# Patient Record
Sex: Female | Born: 1975 | Race: Black or African American | Hispanic: No | Marital: Single | Smoking: Former smoker
Health system: Southern US, Community
[De-identification: ages and names within clinical notes are randomized; demographics above are authoritative.]

## PROBLEM LIST (undated history)

## (undated) DIAGNOSIS — R569 Unspecified convulsions: Secondary | ICD-10-CM

## (undated) DIAGNOSIS — I519 Heart disease, unspecified: Secondary | ICD-10-CM

## (undated) DIAGNOSIS — R609 Edema, unspecified: Secondary | ICD-10-CM

## (undated) DIAGNOSIS — M069 Rheumatoid arthritis, unspecified: Secondary | ICD-10-CM

## (undated) DIAGNOSIS — J9601 Acute respiratory failure with hypoxia: Secondary | ICD-10-CM

## (undated) DIAGNOSIS — G4730 Sleep apnea, unspecified: Secondary | ICD-10-CM

## (undated) DIAGNOSIS — D649 Anemia, unspecified: Secondary | ICD-10-CM

## (undated) DIAGNOSIS — J45909 Unspecified asthma, uncomplicated: Secondary | ICD-10-CM

## (undated) DIAGNOSIS — E119 Type 2 diabetes mellitus without complications: Secondary | ICD-10-CM

## (undated) DIAGNOSIS — I10 Essential (primary) hypertension: Secondary | ICD-10-CM

## (undated) DIAGNOSIS — L309 Dermatitis, unspecified: Secondary | ICD-10-CM

## (undated) HISTORY — DX: Acute respiratory failure with hypoxia: J96.01

## (undated) HISTORY — DX: Essential (primary) hypertension: I10

## (undated) HISTORY — DX: Edema, unspecified: R60.9

## (undated) HISTORY — DX: Type 2 diabetes mellitus without complications: E11.9

## (undated) HISTORY — DX: Heart disease, unspecified: I51.9

## (undated) HISTORY — DX: Rheumatoid arthritis, unspecified: M06.9

## (undated) HISTORY — PX: GASTRIC BYPASS: SHX52

## (undated) HISTORY — DX: Dermatitis, unspecified: L30.9

## (undated) HISTORY — PX: APPENDECTOMY: SHX54

## (undated) NOTE — *Deleted (*Deleted)
Cardiology Office Note:    Date:  04/03/2020   ID:  Elizabeth Bautista, DOB [birth year], MRN [id number]  PCP:  Patient, No Pcp Per  Cardiologist:  No primary care provider on file.  Referring MD: Elenore Paddy, NP   No chief complaint on file.   History of Present Illness:    Elizabeth Bautista is a 57 y.o. female with a hx of hypertension, OSA on CPAP, type II diabetes, rheumatoid arthritis, obesity who is seen as a new consult at the request of Elenore Paddy, NP for the evaluation and management of chest pain.  I reviewed her recent hospitalization, discharged 03/13/20, treated for asthma exacerbation  Chest pain: -Initial onset: -Quality: -Frequency: -Duration: -Associated symptoms: -Aggravating/alleviating factors: -Prior cardiac history: -Prior ECG:  -Prior workup: -Prior treatment: -Alcohol: -Tobacco: -Comorbidities:  -Exercise level: -Cardiac ROS: no shortness of breath, no PND, no orthopnea, no LE edema, no syncope -Family history:   Past Medical History:  Diagnosis Date  . Anemia   . Asthma   . Diabetes (HCC)    on Metformin  . Edema   . 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 HTN (hypertension)   . Rheumatoid arthritis (HCC)    on Plaquenil   . Seizures (HCC)    last seizure 2019  . Sleep apnea   . Sleep apnea     Past Surgical History:  Procedure Laterality Date  . APPENDECTOMY    . CESAREAN SECTION     x3    Current Medications: Current Outpatient Medications on File Prior to Visit  Medication Sig  . acetaminophen (TYLENOL) 500 MG chewable tablet Chew 1 tablet (500 mg total) by mouth every 8 (eight) hours as needed for pain.  Marland Kitchen albuterol (PROAIR HFA) 108 (90 Base) MCG/ACT inhaler 2 puffs every 4 hours as needed only  if your can't catch your breath  . amLODipine (NORVASC) 5 MG tablet Take 1 tablet (5 mg total) by mouth daily.  . blood glucose meter kit and supplies  Dispense based on patient and insurance preference. Use up to four times daily as directed. (FOR ICD-10 E10.9, E11.9).  Marland Kitchen Blood Pressure Monitoring (SPHYGMOMANOMETER) MISC 1 each by Does not apply route daily.  . busPIRone (BUSPAR) 7.5 MG tablet Take 7.5 mg by mouth 2 (two) times daily.  . Cholecalciferol 1.25 MG (50000 UT) TABS Take 1 tablet by mouth daily.  . cyclobenzaprine (FLEXERIL) 10 MG tablet Take 10 mg by mouth 3 (three) times daily as needed for muscle spasms.   Marland Kitchen escitalopram (LEXAPRO) 10 MG tablet Take 1 tablet (10 mg total) by mouth daily.  Marland Kitchen escitalopram (LEXAPRO) 20 MG tablet Take 1 tablet by mouth every evening.  . famotidine (PEPCID) 20 MG tablet Take 20 mg by mouth daily.  . ferrous sulfate 325 (65 FE) MG tablet Take 325 mg by mouth 2 (two) times daily.  . fluticasone (CUTIVATE) 0.005 % ointment Apply 1 application topically daily.  . furosemide (LASIX) 20 MG tablet Take 20 mg by mouth daily.  Marland Kitchen gabapentin (NEURONTIN) 100 MG capsule Take 1 capsule (100 mg total) by mouth 3 (three) times daily.  . hydrochlorothiazide (HYDRODIURIL) 25 MG tablet Take 1 tablet (25 mg total) by mouth daily. For high blood pressure.  . hydroxychloroquine (PLAQUENIL) 200 MG tablet Take 1 tablet (200 mg total) by mouth 2 (two) times daily.  . hydrOXYzine (ATARAX/VISTARIL) 25 MG tablet Take  25 mg by mouth 2 (two) times daily.  Marland Kitchen ibuprofen (ADVIL) 800 MG tablet Take 1 tablet (800 mg total) by mouth every 8 (eight) hours as needed.  Marland Kitchen KLOR-CON M20 20 MEQ tablet TAKE 1 TABLET BY MOUTH DAILY ONLY WHEN YOU TAKE FUROSEMIDE. (Patient taking differently: 20 mEq daily. Only when you take Furosemide.)  . levETIRAcetam (KEPPRA) 500 MG tablet Take 1 tablet (500 mg total) by mouth 2 (two) times daily.  . mirtazapine (REMERON) 15 MG tablet Take 15 mg by mouth at bedtime as needed.  . montelukast (SINGULAIR) 10 MG tablet Take 1 tablet (10 mg total) by mouth at bedtime.  . pantoprazole (PROTONIX) 40 MG tablet Take 1  tablet (40 mg total) by mouth daily. Take 30-60 min before first meal of the day  . prazosin (MINIPRESS) 1 MG capsule Take 1 mg by mouth at bedtime.  . predniSONE (DELTASONE) 10 MG tablet One  daily x 5 days  Then one half  Daily  x 5 days and stop, if worse start over immediately  . QUEtiapine (SEROQUEL) 25 MG tablet Take 25 mg by mouth 2 (two) times daily.   . SYMBICORT 160-4.5 MCG/ACT inhaler Inhale 1 puff into the lungs in the morning and at bedtime.  . Tiotropium Bromide-Olodaterol (STIOLTO RESPIMAT) 2.5-2.5 MCG/ACT AERS Inhale 2 puffs into the lungs daily.  . Tiotropium Bromide-Olodaterol (STIOLTO RESPIMAT) 2.5-2.5 MCG/ACT AERS Inhale 2 puffs into the lungs daily.   No current facility-administered medications on file prior to visit.     Allergies:   Phenytoin and Phenylbutazones   Social History   Tobacco Use  . Smoking status: Former Smoker    Years: 10.00    Types: Cigarettes    Quit date: 06/02/2014    Years since quitting: 5.8  . Smokeless tobacco: Never Used  . Tobacco comment: during the 10 years of smoking, smoked 2-3 cigarettes/day  Vaping Use  . Vaping Use: Never used  Substance Use Topics  . Alcohol use: Not Currently  . Drug use: Not Currently    Family History: family history includes Asthma in her child, child, and maternal aunt; Breast cancer in her cousin; Cervical cancer in her maternal aunt; Dementia in her maternal aunt; Diabetes in her brother, father, paternal aunt, paternal grandmother, sister, and another family member; Heart Problems in her maternal grandmother and sister; Heart attack in an other family member; High blood pressure in her mother and another family member; Seizures in her cousin, cousin, and cousin.  ROS:   Please see the history of present illness.  Additional pertinent ROS: Constitutional: Negative for chills, fever, night sweats, unintentional weight loss  HENT: Negative for ear pain and hearing loss.   Eyes: Negative for loss of  vision and eye pain.  Respiratory: Negative for cough, sputum, wheezing.   Cardiovascular: See HPI. Gastrointestinal: Negative for abdominal pain, melena, and hematochezia.  Genitourinary: Negative for dysuria and hematuria.  Musculoskeletal: Negative for falls and myalgias.  Skin: Negative for itching and rash.  Neurological: Negative for focal weakness, focal sensory changes and loss of consciousness.  Endo/Heme/Allergies: Does not bruise/bleed easily.     EKGs/Labs/Other Studies Reviewed:    The following studies were reviewed today: ***  EKG:  EKG is personally reviewed.  The ekg ordered today demonstrates ***  Recent Labs: 02/16/2020: TSH 1.050 03/08/2020: ALT 19 03/13/2020: BUN 11; Hemoglobin 10.3; Magnesium 2.1; Platelets 283; Potassium 3.3; Sodium 139 03/14/2020: Creatinine, Ser 0.66  Recent Lipid Panel    Component Value Date/Time  CHOL 212 (H) 01/18/2020 1500   TRIG 88 01/18/2020 1500   HDL 82 01/18/2020 1500   CHOLHDL 2.6 01/18/2020 1500   LDLCALC 111 (H) 01/18/2020 1500    Physical Exam:    VS:  There were no vitals taken for this visit.    Wt Readings from Last 3 Encounters:  04/02/20 280 lb (127 kg)  03/08/20 285 lb 7.9 oz (129.5 kg)  02/27/20 284 lb (128.8 kg)    GEN: Well nourished, well developed in no acute distress HEENT: Normal, moist mucous membranes NECK: No JVD CARDIAC: regular rhythm, normal S1 and S2, no rubs or gallops. No murmurs. VASCULAR: Radial and DP pulses 2+ bilaterally. No carotid bruits RESPIRATORY:  Clear to auscultation without rales, wheezing or rhonchi  ABDOMEN: Soft, non-tender, non-distended MUSCULOSKELETAL:  Ambulates independently SKIN: Warm and dry, no edema NEUROLOGIC:  Alert and oriented x 3. No focal neuro deficits noted. PSYCHIATRIC:  Normal affect    ASSESSMENT:    No diagnosis found. PLAN:     Cardiac risk counseling and prevention recommendations: -recommend heart healthy/Mediterranean diet, with whole  grains, fruits, vegetable, fish, lean meats, nuts, and olive oil. Limit salt. -recommend moderate walking, 3-5 times/week for 30-50 minutes each session. Aim for at least 150 minutes.week. Goal should be pace of 3 miles/hours, or walking 1.5 miles in 30 minutes -recommend avoidance of tobacco products. Avoid excess alcohol. -Additional risk factor control:  -Diabetes risk: A1c is  -Lipids:   -Blood pressure control:  -Weight:  -ASCVD risk score: The 10-year ASCVD risk score Denman George DC Montez Hageman., et al., 2013) is: 3.3%   Values used to calculate the score:     Age: 23 years     Sex: Female     Is Non-Hispanic African American: Yes     Diabetic: Yes     Tobacco smoker: No     Systolic Blood Pressure: 130 mmHg     Is BP treated: Yes     HDL Cholesterol: 82 mg/dL     Total Cholesterol: 212 mg/dL    Plan for follow up:  Jodelle Red, MD, PhD [place]  [facility]    Medication Adjustments/Labs and Tests Ordered: Current medicines are reviewed at length with the patient today.  Concerns regarding medicines are outlined above.  No orders of the defined types were placed in this encounter.  No orders of the defined types were placed in this encounter.   There are no Patient Instructions on file for this visit.  Signed, Jodelle Red, MD PhD 04/03/2020 7:57 AM    [facility]

## (undated) NOTE — *Deleted (*Deleted)
Cardiology Office Note:    Date:  03/19/2020   ID:  Virgina Bautista, DOB Dec 05, 1975, MRN [id number]  PCP:  Wilson Singer, MD  Cardiologist:  No primary care provider on file.  Referring MD: Elenore Paddy, NP   No chief complaint on file.   History of Present Illness:    Elizabeth Bautista is a 39 y.o. female with a hx of *** who is seen as a new consult at the request of Elenore Paddy, NP for the evaluation and management of chest pain.  I reviewed her recent hospitalization, discharged 03/13/20, treated for asthma exacerbation.  Chest pain: -Initial onset: -Quality: -Frequency: -Duration: -Associated symptoms: -Aggravating/alleviating factors: -Prior cardiac history: -Prior ECG:  -Prior workup: -Prior treatment: -Alcohol: -Tobacco: -Comorbidities:  -Exercise level: -Cardiac ROS: no shortness of breath, no PND, no orthopnea, no LE edema, no syncope -Family history:   Past Medical History:  Diagnosis Date  . Anemia   . Asthma   . Diabetes (HCC)    on Metformin  . Edema   . 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 HTN (hypertension)   . Rheumatoid arthritis (HCC)    on Plaquenil   . Seizures (HCC)    last seizure 2019  . Sleep apnea   . Sleep apnea     Past Surgical History:  Procedure Laterality Date  . APPENDECTOMY    . CESAREAN SECTION     x3    Current Medications: Current Outpatient Medications on File Prior to Visit  Medication Sig  . acetaminophen (TYLENOL) 500 MG chewable tablet Chew 1 tablet (500 mg total) by mouth every 8 (eight) hours as needed for pain.  Marland Kitchen albuterol (PROAIR HFA) 108 (90 Base) MCG/ACT inhaler 2 puffs every 4 hours as needed only  if your can't catch your breath  . blood glucose meter kit and supplies Dispense based on patient and insurance preference. Use up to four times daily as directed. (FOR ICD-10 E10.9, E11.9).  Marland Kitchen Blood Pressure Monitoring  (SPHYGMOMANOMETER) MISC 1 each by Does not apply route daily.  . busPIRone (BUSPAR) 7.5 MG tablet Take 7.5 mg by mouth 2 (two) times daily.  . Cholecalciferol 1.25 MG (50000 UT) TABS Take 1 tablet by mouth daily.  . cyclobenzaprine (FLEXERIL) 10 MG tablet Take 10 mg by mouth 3 (three) times daily as needed for muscle spasms.   Marland Kitchen escitalopram (LEXAPRO) 10 MG tablet Take 1 tablet (10 mg total) by mouth daily.  Marland Kitchen escitalopram (LEXAPRO) 20 MG tablet Take 1 tablet by mouth every evening.  . famotidine (PEPCID) 20 MG tablet Take 20 mg by mouth daily.  . ferrous sulfate 325 (65 FE) MG tablet Take 325 mg by mouth 2 (two) times daily.  . fluticasone (CUTIVATE) 0.005 % ointment Apply 1 application topically daily.  Marland Kitchen gabapentin (NEURONTIN) 100 MG capsule Take 1 capsule (100 mg total) by mouth 3 (three) times daily.  . hydrochlorothiazide (HYDRODIURIL) 25 MG tablet Take 1 tablet (25 mg total) by mouth daily. For high blood pressure.  . hydroxychloroquine (PLAQUENIL) 200 MG tablet Take 1 tablet (200 mg total) by mouth 2 (two) times daily.  . hydrOXYzine (ATARAX/VISTARIL) 25 MG tablet Take 25 mg by mouth 2 (two) times daily.  Marland Kitchen ibuprofen (ADVIL) 800 MG tablet Take 1 tablet (800 mg total) by mouth every 8 (eight) hours as needed.  Marland Kitchen KLOR-CON M20 20 MEQ tablet TAKE 1  TABLET BY MOUTH DAILY ONLY WHEN YOU TAKE FUROSEMIDE. (Patient taking differently: 20 mEq daily. Only when you take Furosemide.)  . levETIRAcetam (KEPPRA) 500 MG tablet Take 1 tablet (500 mg total) by mouth 2 (two) times daily.  . mirtazapine (REMERON) 15 MG tablet Take 15 mg by mouth at bedtime as needed.  . montelukast (SINGULAIR) 10 MG tablet Take 1 tablet (10 mg total) by mouth at bedtime.  . pantoprazole (PROTONIX) 40 MG tablet Take 1 tablet (40 mg total) by mouth daily. Take 30-60 min before first meal of the day  . prazosin (MINIPRESS) 1 MG capsule Take 1 mg by mouth at bedtime.  Marland Kitchen QUEtiapine (SEROQUEL) 25 MG tablet Take 25 mg by mouth 2  (two) times daily.   . SYMBICORT 160-4.5 MCG/ACT inhaler Inhale 1 puff into the lungs in the morning and at bedtime.  . Tiotropium Bromide-Olodaterol (STIOLTO RESPIMAT) 2.5-2.5 MCG/ACT AERS Inhale 2 puffs into the lungs daily.   No current facility-administered medications on file prior to visit.     Allergies:   Phenytoin and Phenylbutazones   Social History   Tobacco Use  . Smoking status: Former Smoker    Years: 10.00    Types: Cigarettes    Quit date: 06/02/2014    Years since quitting: 5.8  . Smokeless tobacco: Never Used  . Tobacco comment: during the 10 years of smoking, smoked 2-3 cigarettes/day  Vaping Use  . Vaping Use: Never used  Substance Use Topics  . Alcohol use: Not Currently  . Drug use: Not Currently    Family History: family history includes Asthma in her child, child, and maternal aunt; Breast cancer in her cousin; Cervical cancer in her maternal aunt; Dementia in her maternal aunt; Diabetes in her brother, father, paternal aunt, paternal grandmother, sister, and another family member; Heart Problems in her maternal grandmother and sister; Heart attack in an other family member; High blood pressure in her mother and another family member; Seizures in her cousin, cousin, and cousin.  ROS:   Please see the history of present illness.  Additional pertinent ROS: Constitutional: Negative for chills, fever, night sweats, unintentional weight loss  HENT: Negative for ear pain and hearing loss.   Eyes: Negative for loss of vision and eye pain.  Respiratory: Negative for cough, sputum, wheezing.   Cardiovascular: See HPI. Gastrointestinal: Negative for abdominal pain, melena, and hematochezia.  Genitourinary: Negative for dysuria and hematuria.  Musculoskeletal: Negative for falls and myalgias.  Skin: Negative for itching and rash.  Neurological: Negative for focal weakness, focal sensory changes and loss of consciousness.  Endo/Heme/Allergies: Does not bruise/bleed  easily.     EKGs/Labs/Other Studies Reviewed:    The following studies were reviewed today: ***  EKG:  EKG is personally reviewed.  The ekg ordered today demonstrates ***  Recent Labs: 02/16/2020: TSH 1.050 03/08/2020: ALT 19 03/13/2020: BUN 11; Hemoglobin 10.3; Magnesium 2.1; Platelets 283; Potassium 3.3; Sodium 139 03/14/2020: Creatinine, Ser 0.66  Recent Lipid Panel    Component Value Date/Time   CHOL 212 (H) 01/18/2020 1500   TRIG 88 01/18/2020 1500   HDL 82 01/18/2020 1500   CHOLHDL 2.6 01/18/2020 1500   LDLCALC 111 (H) 01/18/2020 1500    Physical Exam:    VS:  There were no vitals taken for this visit.    Wt Readings from Last 3 Encounters:  03/08/20 285 lb 7.9 oz (129.5 kg)  02/27/20 284 lb (128.8 kg)  02/23/20 286 lb 6.4 oz (129.9 kg)  GEN: Well nourished, well developed in no acute distress HEENT: Normal, moist mucous membranes NECK: No JVD CARDIAC: regular rhythm, normal S1 and S2, no rubs or gallops. No murmurs. VASCULAR: Radial and DP pulses 2+ bilaterally. No carotid bruits RESPIRATORY:  Clear to auscultation without rales, wheezing or rhonchi  ABDOMEN: Soft, non-tender, non-distended MUSCULOSKELETAL:  Ambulates independently SKIN: Warm and dry, no edema NEUROLOGIC:  Alert and oriented x 3. No focal neuro deficits noted. PSYCHIATRIC:  Normal affect    ASSESSMENT:    No diagnosis found. PLAN:     Cardiac risk counseling and prevention recommendations: -recommend heart healthy/Mediterranean diet, with whole grains, fruits, vegetable, fish, lean meats, nuts, and olive oil. Limit salt. -recommend moderate walking, 3-5 times/week for 30-50 minutes each session. Aim for at least 150 minutes.week. Goal should be pace of 3 miles/hours, or walking 1.5 miles in 30 minutes -recommend avoidance of tobacco products. Avoid excess alcohol. -Additional risk factor control:  -Diabetes risk: A1c is  -Lipids:   -Blood pressure control:  -Weight:  -ASCVD risk  score: The 10-year ASCVD risk score Denman George DC Montez Hageman., et al., 2013) is: 2.6%   Values used to calculate the score:     Age: 76 years     Sex: Female     Is Non-Hispanic African American: Yes     Diabetic: Yes     Tobacco smoker: No     Systolic Blood Pressure: 127 mmHg     Is BP treated: Yes     HDL Cholesterol: 82 mg/dL     Total Cholesterol: 212 mg/dL    Plan for follow up:  Jodelle Red, MD, PhD [place]  [facility]    Medication Adjustments/Labs and Tests Ordered: Current medicines are reviewed at length with the patient today.  Concerns regarding medicines are outlined above.  No orders of the defined types were placed in this encounter.  No orders of the defined types were placed in this encounter.   There are no Patient Instructions on file for this visit.  Signed, Jodelle Red, MD PhD 03/19/2020 8:16 AM    [facility]

---

## 2001-06-02 HISTORY — PX: TUBAL LIGATION: SHX77

## 2016-10-23 LAB — PULMONARY FUNCTION TEST
DLCO: 4.73 ml/mmHg sec
FEV1/FVC: 83 %
FEV1: 2.52 L
FVC: 3.07 L
TLC: 4.56

## 2017-07-08 LAB — GLUCOSE, POCT (MANUAL RESULT ENTRY): POC Glucose: 129 mg/dl — AB (ref 70–99)

## 2017-07-09 LAB — GLUCOSE, POCT (MANUAL RESULT ENTRY)
POC Glucose: 143 mg/dl — AB (ref 70–99)
POC Glucose: 92 mg/dl (ref 70–99)

## 2017-07-10 LAB — GLUCOSE, POCT (MANUAL RESULT ENTRY): POC Glucose: 136 mg/dl — AB (ref 70–99)

## 2019-10-18 ENCOUNTER — Emergency Department (HOSPITAL_COMMUNITY): Payer: PRIVATE HEALTH INSURANCE

## 2019-10-18 ENCOUNTER — Inpatient Hospital Stay (HOSPITAL_COMMUNITY)
Admission: EM | Admit: 2019-10-18 | Discharge: 2019-10-23 | DRG: 202 | Disposition: A | Discharge status: Home / Self Care | Payer: PRIVATE HEALTH INSURANCE | Attending: Internal Medicine | Admitting: Internal Medicine

## 2019-10-18 ENCOUNTER — Encounter (HOSPITAL_COMMUNITY): Payer: Self-pay | Admitting: *Deleted

## 2019-10-18 ENCOUNTER — Other Ambulatory Visit: Payer: Self-pay

## 2019-10-18 ENCOUNTER — Ambulatory Visit
Admission: EM | Admit: 2019-10-18 | Discharge: 2019-10-18 | Disposition: A | Discharge status: Home / Self Care | Payer: Self-pay

## 2019-10-18 DIAGNOSIS — M069 Rheumatoid arthritis, unspecified: Secondary | ICD-10-CM | POA: Diagnosis present

## 2019-10-18 DIAGNOSIS — Z87891 Personal history of nicotine dependence: Secondary | ICD-10-CM

## 2019-10-18 DIAGNOSIS — E1165 Type 2 diabetes mellitus with hyperglycemia: Secondary | ICD-10-CM | POA: Diagnosis present

## 2019-10-18 DIAGNOSIS — G4733 Obstructive sleep apnea (adult) (pediatric): Secondary | ICD-10-CM | POA: Diagnosis present

## 2019-10-18 DIAGNOSIS — Z888 Allergy status to other drugs, medicaments and biological substances status: Secondary | ICD-10-CM

## 2019-10-18 DIAGNOSIS — K219 Gastro-esophageal reflux disease without esophagitis: Secondary | ICD-10-CM | POA: Diagnosis present

## 2019-10-18 DIAGNOSIS — F32A Depression, unspecified: Secondary | ICD-10-CM | POA: Diagnosis present

## 2019-10-18 DIAGNOSIS — D649 Anemia, unspecified: Secondary | ICD-10-CM | POA: Diagnosis present

## 2019-10-18 DIAGNOSIS — F419 Anxiety disorder, unspecified: Secondary | ICD-10-CM | POA: Diagnosis present

## 2019-10-18 DIAGNOSIS — J45901 Unspecified asthma with (acute) exacerbation: Secondary | ICD-10-CM | POA: Diagnosis not present

## 2019-10-18 DIAGNOSIS — Z20822 Contact with and (suspected) exposure to covid-19: Secondary | ICD-10-CM | POA: Diagnosis present

## 2019-10-18 DIAGNOSIS — R0602 Shortness of breath: Secondary | ICD-10-CM | POA: Diagnosis not present

## 2019-10-18 DIAGNOSIS — R490 Dysphonia: Secondary | ICD-10-CM | POA: Diagnosis present

## 2019-10-18 DIAGNOSIS — Z886 Allergy status to analgesic agent status: Secondary | ICD-10-CM

## 2019-10-18 DIAGNOSIS — Z79899 Other long term (current) drug therapy: Secondary | ICD-10-CM

## 2019-10-18 DIAGNOSIS — J383 Other diseases of vocal cords: Secondary | ICD-10-CM | POA: Diagnosis present

## 2019-10-18 DIAGNOSIS — Z833 Family history of diabetes mellitus: Secondary | ICD-10-CM

## 2019-10-18 DIAGNOSIS — Z6841 Body Mass Index (BMI) 40.0 and over, adult: Secondary | ICD-10-CM

## 2019-10-18 DIAGNOSIS — M059 Rheumatoid arthritis with rheumatoid factor, unspecified: Secondary | ICD-10-CM

## 2019-10-18 DIAGNOSIS — Z8249 Family history of ischemic heart disease and other diseases of the circulatory system: Secondary | ICD-10-CM

## 2019-10-18 DIAGNOSIS — F329 Major depressive disorder, single episode, unspecified: Secondary | ICD-10-CM

## 2019-10-18 DIAGNOSIS — R0600 Dyspnea, unspecified: Secondary | ICD-10-CM

## 2019-10-18 HISTORY — DX: Sleep apnea, unspecified: G47.30

## 2019-10-18 HISTORY — DX: Unspecified convulsions: R56.9

## 2019-10-18 HISTORY — DX: Anemia, unspecified: D64.9

## 2019-10-18 HISTORY — DX: Unspecified asthma, uncomplicated: J45.909

## 2019-10-18 LAB — SARS CORONAVIRUS 2 BY RT PCR (HOSPITAL ORDER, PERFORMED IN ~~LOC~~ HOSPITAL LAB): SARS Coronavirus 2: NEGATIVE

## 2019-10-18 LAB — COMPREHENSIVE METABOLIC PANEL
ALT: 16 U/L (ref 0–44)
AST: 19 U/L (ref 15–41)
Albumin: 3.6 g/dL (ref 3.5–5.0)
Alkaline Phosphatase: 76 U/L (ref 38–126)
Anion gap: 7 (ref 5–15)
BUN: 8 mg/dL (ref 6–20)
CO2: 24 mmol/L (ref 22–32)
Calcium: 8.6 mg/dL — ABNORMAL LOW (ref 8.9–10.3)
Chloride: 107 mmol/L (ref 98–111)
Creatinine, Ser: 0.66 mg/dL (ref 0.44–1.00)
GFR calc Af Amer: >60 mL/min (ref >60)
GFR calc non Af Amer: >60 mL/min (ref >60)
Glucose, Bld: 133 mg/dL — ABNORMAL HIGH (ref 70–99)
Potassium: 4 mmol/L (ref 3.5–5.1)
Sodium: 138 mmol/L (ref 135–145)
Total Bilirubin: 0.4 mg/dL (ref 0.3–1.2)
Total Protein: 7.1 g/dL (ref 6.5–8.1)

## 2019-10-18 LAB — CBC WITH DIFFERENTIAL/PLATELET
Abs Immature Granulocytes: 0.02 10*3/uL (ref 0.00–0.07)
Basophils Absolute: 0 10*3/uL (ref 0.0–0.1)
Basophils Relative: 1 %
Eosinophils Absolute: 0.4 10*3/uL (ref 0.0–0.5)
Eosinophils Relative: 7 %
HCT: 39 % (ref 36.0–46.0)
Hemoglobin: 11.8 g/dL — ABNORMAL LOW (ref 12.0–15.0)
Immature Granulocytes: 0 %
Lymphocytes Relative: 20 %
Lymphs Abs: 1.2 10*3/uL (ref 0.7–4.0)
MCH: 24.1 pg — ABNORMAL LOW (ref 26.0–34.0)
MCHC: 30.3 g/dL (ref 30.0–36.0)
MCV: 79.8 fL — ABNORMAL LOW (ref 80.0–100.0)
Monocytes Absolute: 0.4 10*3/uL (ref 0.1–1.0)
Monocytes Relative: 6 %
Neutro Abs: 3.9 10*3/uL (ref 1.7–7.7)
Neutrophils Relative %: 66 %
Platelets: 228 10*3/uL (ref 150–400)
RBC: 4.89 MIL/uL (ref 3.87–5.11)
RDW: 15.9 % — ABNORMAL HIGH (ref 11.5–15.5)
WBC: 5.8 10*3/uL (ref 4.0–10.5)
nRBC: 0 % (ref 0.0–0.2)

## 2019-10-18 LAB — BLOOD GAS, ARTERIAL
Acid-base deficit: 9.8 mmol/L — ABNORMAL HIGH (ref 0.0–2.0)
Bicarbonate: 17.1 mmol/L — ABNORMAL LOW (ref 20.0–28.0)
FIO2: 21
O2 Saturation: 99.1 %
Patient temperature: 37
pCO2 arterial: 28.7 mmHg — ABNORMAL LOW (ref 32.0–48.0)
pH, Arterial: 7.336 — ABNORMAL LOW (ref 7.350–7.450)
pO2, Arterial: 165 mmHg — ABNORMAL HIGH (ref 83.0–108.0)

## 2019-10-18 IMAGING — DX DG CHEST 1V PORT
1 series · 1 of 1 positions shown · non-contrast
Comparison: None.

CLINICAL DATA: Shortness of breath

EXAM:
PORTABLE CHEST 1 VIEW

[chest ap]
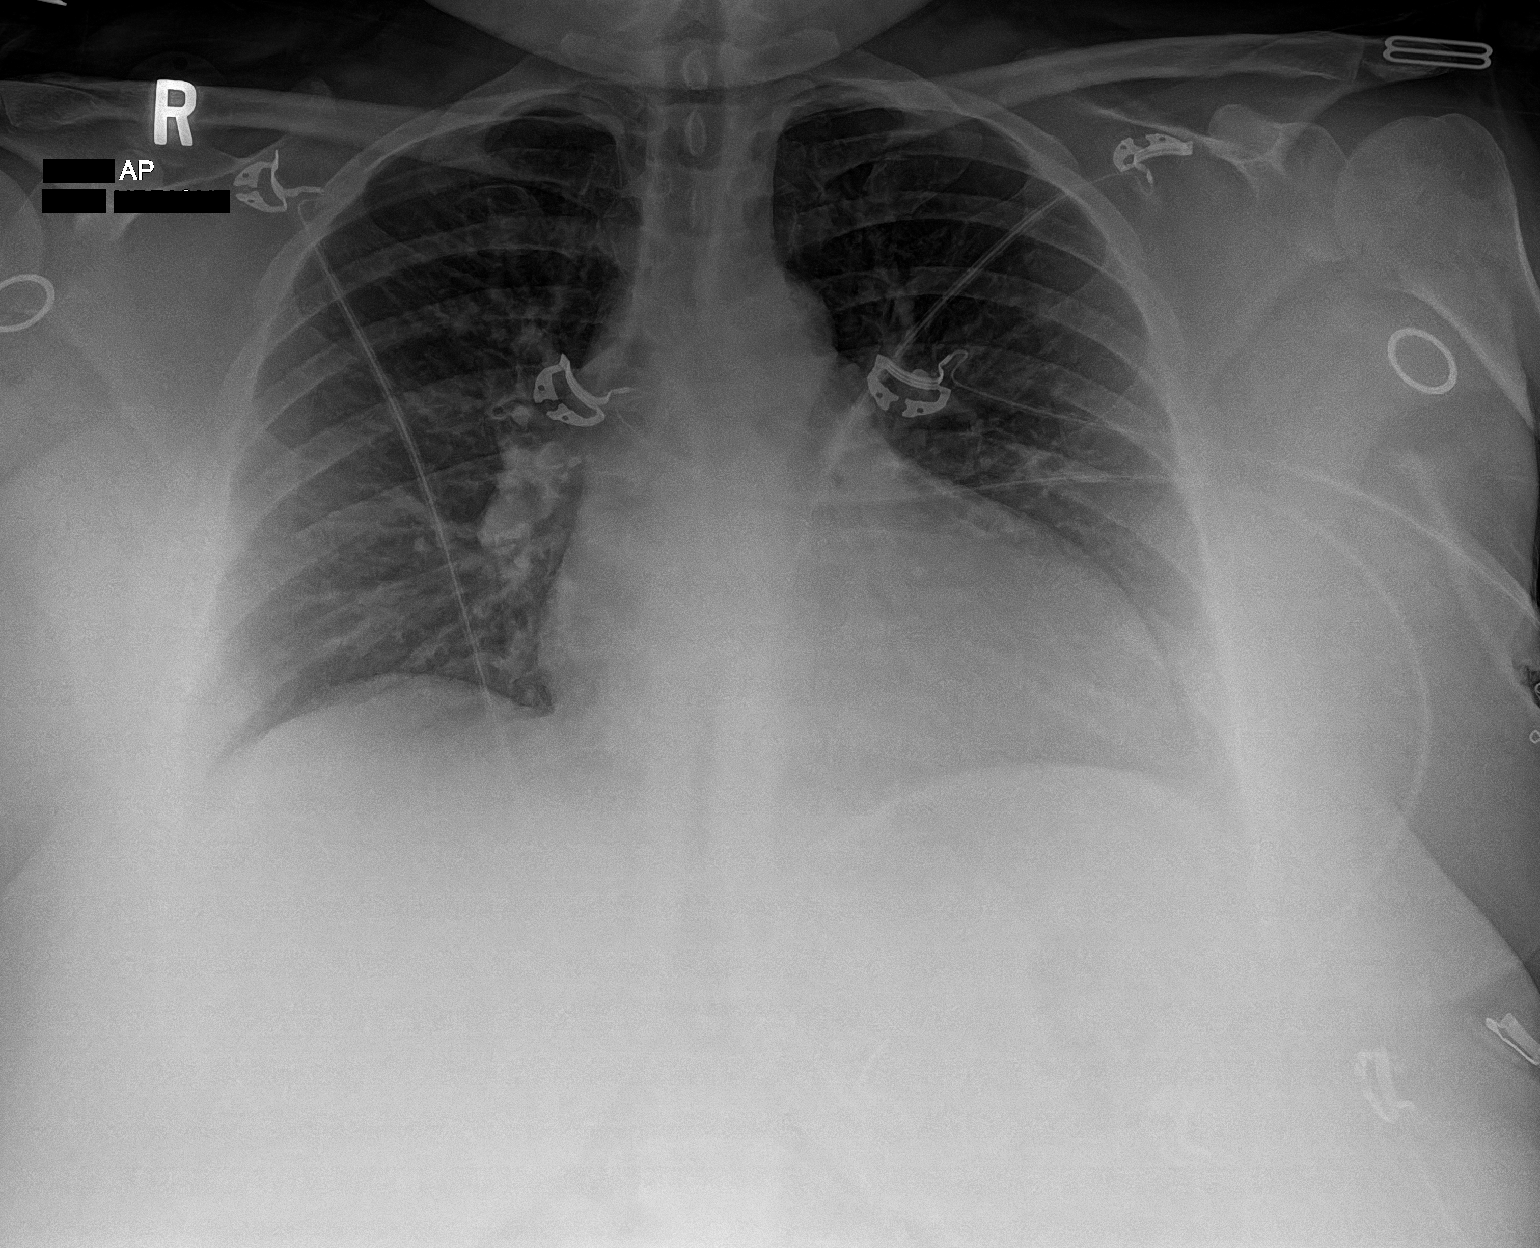

[1 of 1 positions shown; findings below may reference images not displayed]

FINDINGS: Lungs are clear. Heart is borderline enlarged with pulmonary
vascularity normal. No adenopathy. No bone lesions.
IMPRESSION: Borderline cardiac enlargement.  Lungs clear.

## 2019-10-18 MED ORDER — METHYLPREDNISOLONE SODIUM SUCC 125 MG IJ SOLR
125.0000 mg | Freq: Once | INTRAMUSCULAR | Status: AC
  Administered 2019-10-18: 125 mg via INTRAVENOUS
  Filled 2019-10-18: qty 2

## 2019-10-18 MED ORDER — NAPROXEN 250 MG PO TABS
500.0000 mg | ORAL_TABLET | Freq: Once | ORAL | Status: AC
  Administered 2019-10-18: 500 mg via ORAL
  Filled 2019-10-18 (×2): qty 2

## 2019-10-18 MED ORDER — TIOTROPIUM BROMIDE MONOHYDRATE 2.5 MCG/ACT IN AERS: 2.0000 | INHALATION_SPRAY | Freq: Every day | RESPIRATORY_TRACT | Status: DC

## 2019-10-18 MED ORDER — ALBUTEROL SULFATE (2.5 MG/3ML) 0.083% IN NEBU
2.5000 mg | INHALATION_SOLUTION | RESPIRATORY_TRACT | Status: DC | PRN
  Administered 2019-10-18: 2.5 mg via RESPIRATORY_TRACT
  Filled 2019-10-18 (×2): qty 3

## 2019-10-18 MED ORDER — IPRATROPIUM-ALBUTEROL 0.5-2.5 (3) MG/3ML IN SOLN
3.0000 mL | Freq: Once | RESPIRATORY_TRACT | Status: AC
  Administered 2019-10-18: 3 mL via RESPIRATORY_TRACT
  Filled 2019-10-18: qty 3

## 2019-10-18 MED ORDER — ACETAMINOPHEN 325 MG PO TABS
650.0000 mg | ORAL_TABLET | Freq: Four times a day (QID) | ORAL | Status: DC | PRN
  Administered 2019-10-20: 650 mg via ORAL
  Filled 2019-10-18: qty 2

## 2019-10-18 MED ORDER — ESCITALOPRAM OXALATE 10 MG PO TABS
10.0000 mg | ORAL_TABLET | Freq: Every day | ORAL | Status: DC
  Administered 2019-10-18 – 2019-10-22 (×5): 10 mg via ORAL
  Filled 2019-10-18 (×5): qty 1

## 2019-10-18 MED ORDER — QUETIAPINE FUMARATE 25 MG PO TABS
25.0000 mg | ORAL_TABLET | Freq: Two times a day (BID) | ORAL | Status: DC
  Administered 2019-10-18 – 2019-10-23 (×10): 25 mg via ORAL
  Filled 2019-10-18 (×10): qty 1

## 2019-10-18 MED ORDER — METHYLPREDNISOLONE SODIUM SUCC 125 MG IJ SOLR
125.0000 mg | Freq: Once | INTRAMUSCULAR | Status: AC
  Administered 2019-10-18: 125 mg via INTRAMUSCULAR

## 2019-10-18 MED ORDER — CYCLOBENZAPRINE HCL 10 MG PO TABS: 10.0000 mg | ORAL_TABLET | Freq: Three times a day (TID) | ORAL | Status: DC | PRN

## 2019-10-18 MED ORDER — HYDROXYCHLOROQUINE SULFATE 200 MG PO TABS
200.0000 mg | ORAL_TABLET | Freq: Two times a day (BID) | ORAL | Status: DC
  Administered 2019-10-18 – 2019-10-23 (×10): 200 mg via ORAL
  Filled 2019-10-18 (×13): qty 1

## 2019-10-18 MED ORDER — IPRATROPIUM-ALBUTEROL 0.5-2.5 (3) MG/3ML IN SOLN
3.0000 mL | RESPIRATORY_TRACT | Status: DC | PRN
  Administered 2019-10-18: 3 mL via RESPIRATORY_TRACT
  Filled 2019-10-18: qty 3

## 2019-10-18 MED ORDER — PROMETHAZINE HCL 12.5 MG PO TABS: 12.5000 mg | ORAL_TABLET | Freq: Four times a day (QID) | ORAL | Status: DC | PRN

## 2019-10-18 MED ORDER — METHYLPREDNISOLONE SODIUM SUCC 125 MG IJ SOLR: 80.0000 mg | Freq: Two times a day (BID) | INTRAMUSCULAR | Status: DC

## 2019-10-18 MED ORDER — SODIUM CHLORIDE 0.9 % IV BOLUS
500.0000 mL | Freq: Once | INTRAVENOUS | Status: AC
  Administered 2019-10-18: 500 mL via INTRAVENOUS

## 2019-10-18 MED ORDER — ALBUTEROL SULFATE HFA 108 (90 BASE) MCG/ACT IN AERS: 2.0000 | INHALATION_SPRAY | Freq: Once | RESPIRATORY_TRACT | Status: AC

## 2019-10-18 MED ORDER — MAGNESIUM SULFATE 2 GM/50ML IV SOLN
2.0000 g | Freq: Once | INTRAVENOUS | Status: AC
  Administered 2019-10-18: 2 g via INTRAVENOUS
  Filled 2019-10-18: qty 50

## 2019-10-18 MED ORDER — POLYETHYLENE GLYCOL 3350 17 G PO PACK: 17.0000 g | PACK | Freq: Every day | ORAL | Status: DC | PRN

## 2019-10-18 MED ORDER — ACETAMINOPHEN 650 MG RE SUPP: 650.0000 mg | Freq: Four times a day (QID) | RECTAL | Status: DC | PRN

## 2019-10-18 MED ORDER — LORAZEPAM 2 MG/ML IJ SOLN
0.5000 mg | Freq: Once | INTRAMUSCULAR | Status: AC
  Administered 2019-10-18: 0.5 mg via INTRAVENOUS
  Filled 2019-10-18: qty 1

## 2019-10-18 MED ORDER — GUAIFENESIN-DM 100-10 MG/5ML PO SYRP
10.0000 mL | ORAL_SOLUTION | Freq: Four times a day (QID) | ORAL | Status: AC
  Administered 2019-10-18 – 2019-10-19 (×4): 10 mL via ORAL
  Filled 2019-10-18 (×4): qty 10

## 2019-10-18 MED ORDER — UMECLIDINIUM BROMIDE 62.5 MCG/INH IN AEPB
1.0000 | INHALATION_SPRAY | Freq: Every day | RESPIRATORY_TRACT | Status: DC
  Filled 2019-10-18: qty 7

## 2019-10-18 MED ORDER — ALBUTEROL SULFATE (2.5 MG/3ML) 0.083% IN NEBU
2.5000 mg | INHALATION_SOLUTION | Freq: Four times a day (QID) | RESPIRATORY_TRACT | Status: DC
  Administered 2019-10-18: 2.5 mg via RESPIRATORY_TRACT

## 2019-10-18 MED ORDER — BUSPIRONE HCL 5 MG PO TABS
7.5000 mg | ORAL_TABLET | Freq: Two times a day (BID) | ORAL | Status: DC
  Administered 2019-10-18 – 2019-10-23 (×10): 7.5 mg via ORAL
  Filled 2019-10-18 (×10): qty 2

## 2019-10-18 MED ORDER — HYDROXYZINE HCL 10 MG PO TABS
10.0000 mg | ORAL_TABLET | Freq: Two times a day (BID) | ORAL | Status: DC
  Administered 2019-10-18 – 2019-10-23 (×10): 10 mg via ORAL
  Filled 2019-10-18 (×14): qty 1

## 2019-10-18 MED ORDER — NAPROXEN 250 MG PO TABS
500.0000 mg | ORAL_TABLET | Freq: Two times a day (BID) | ORAL | Status: DC
  Administered 2019-10-19 – 2019-10-21 (×4): 500 mg via ORAL
  Filled 2019-10-18 (×9): qty 2

## 2019-10-18 MED ORDER — ENOXAPARIN SODIUM 80 MG/0.8ML ~~LOC~~ SOLN
0.5000 mg/kg | SUBCUTANEOUS | Status: DC
  Administered 2019-10-18 – 2019-10-22 (×5): 65 mg via SUBCUTANEOUS
  Filled 2019-10-18 (×5): qty 0.8

## 2019-10-18 MED ORDER — MONTELUKAST SODIUM 10 MG PO TABS
10.0000 mg | ORAL_TABLET | Freq: Every day | ORAL | Status: DC
  Administered 2019-10-18 – 2019-10-22 (×5): 10 mg via ORAL
  Filled 2019-10-18 (×5): qty 1

## 2019-10-18 MED ORDER — ALBUTEROL SULFATE HFA 108 (90 BASE) MCG/ACT IN AERS
INHALATION_SPRAY | RESPIRATORY_TRACT | Status: AC
  Administered 2019-10-18: 2 via RESPIRATORY_TRACT
  Filled 2019-10-18: qty 6.7

## 2019-10-18 MED ORDER — FAMOTIDINE 20 MG PO TABS
20.0000 mg | ORAL_TABLET | Freq: Every day | ORAL | Status: DC
  Administered 2019-10-18 – 2019-10-19 (×2): 20 mg via ORAL
  Filled 2019-10-18 (×2): qty 1

## 2019-10-18 MED ORDER — ALBUTEROL (5 MG/ML) CONTINUOUS INHALATION SOLN
15.0000 mg | INHALATION_SOLUTION | Freq: Once | RESPIRATORY_TRACT | Status: AC
  Administered 2019-10-18: 15 mg via RESPIRATORY_TRACT
  Filled 2019-10-18: qty 20

## 2019-10-18 MED ORDER — IPRATROPIUM-ALBUTEROL 0.5-2.5 (3) MG/3ML IN SOLN
3.0000 mL | Freq: Four times a day (QID) | RESPIRATORY_TRACT | Status: DC
  Administered 2019-10-19 – 2019-10-21 (×10): 3 mL via RESPIRATORY_TRACT
  Filled 2019-10-18 (×10): qty 3

## 2019-10-18 NOTE — H&P (Addendum)
History and Physical    Elizabeth Bautista ZSM:[id number] DOB: November 07, 1975 DOA: 10/18/2019  PCP: System, Pcp Not In   Patient coming from: Home   I have personally briefly reviewed patient's old medical records in [facility]  Chief Complaint: Difficulty breathing.  HPI: Elizabeth Bautista is a 44 y.o. female with medical history significant for asthma, anemia, seizures, sleep apnea.  Patient presented to the ED with complaints of difficulty breathing started 2 weeks ago.  She also reports wheezing, cough productive of clear sputum.  She reports this is consistent with her asthma exacerbations. No chest pains, no lower extremity swelling. Never been diagnosed with COVID-19 infection, and has not received her COVID vaccination. Patient lives in [place], and has been in [place] visiting for the past 2 months. Is undecided if she is going to move to [place]. She reports admissions in the past for asthma exacerbations in [place]. She has never required intubated.  ED Course: O2 sats 98% on room air temperature 98.2.  WBC 5.8.  Portable chest x-ray without acute abnormality.  COVID-19 test negative.  DuoNebs, hour-long albuterol nebulizer, Ativan 0.5 mg, Solu-Medrol 125 mg, magnesium, given, still with significant wheezing and increased work of breathing hence hospitalist was called for admission for asthma exacerbation.  Review of Systems: As per HPI all other systems reviewed and negative.  Past Medical History:  Diagnosis Date  . Anemia   . Asthma   . Seizures (HCC)    last seizure 2019  . Sleep apnea     Past Surgical History:  Procedure Laterality Date  . APPENDECTOMY    . CESAREAN SECTION     x3     reports that she quit smoking about 5 years ago. She has never used smokeless tobacco. She reports previous alcohol use. She reports previous drug use.  Allergies  Allergen Reactions  . Phenytoin Anaphylaxis  . Phenylbutazones     Family History  Family history unknown:  Yes   Family history of hypertension and diabetes.  Prior to Admission medications   Medication Sig Start Date End Date Taking? Authorizing Provider  albuterol (VENTOLIN HFA) 108 (90 Base) MCG/ACT inhaler Inhale 1-2 puffs into the lungs every 6 (six) hours as needed for wheezing or shortness of breath.  08/26/19  Yes [provider]  busPIRone (BUSPAR) 7.5 MG tablet Take 7.5 mg by mouth 2 (two) times daily. 09/05/19  Yes [provider]  Cholecalciferol (VITAMIN D3) 10 MCG (400 UNIT) tablet Take 800 Units by mouth daily.  05/24/19  Yes [provider]  cyclobenzaprine (FLEXERIL) 10 MG tablet Take 10 mg by mouth 3 (three) times daily as needed for muscle spasms.  08/27/19  Yes [provider]  escitalopram (LEXAPRO) 10 MG tablet Take 10 mg by mouth in the morning and at bedtime.  09/05/19  Yes [provider]  famotidine (PEPCID) 20 MG tablet Take 20 mg by mouth daily. 07/31/19  Yes [provider]  ferrous sulfate 325 (65 FE) MG tablet Take 325 mg by mouth 2 (two) times daily. 05/17/19  Yes [provider]  fluticasone (CUTIVATE) 0.005 % ointment Apply 1 application topically daily. 10/11/19  Yes [provider]  hydroxychloroquine (PLAQUENIL) 200 MG tablet Take 200 mg by mouth 2 (two) times daily. 07/31/19  Yes [provider]  hydrOXYzine (ATARAX/VISTARIL) 10 MG tablet Take 10 mg by mouth in the morning and at bedtime. 09/05/19  Yes [provider]  ipratropium-albuterol (DUONEB) 0.5-2.5 (3) MG/3ML  SOLN Inhale 3 mLs into the lungs every 6 (six) hours as needed (for shortness of breath/wheezing).  07/31/19  Yes [provider]  montelukast (SINGULAIR) 10 MG tablet Take 10 mg by mouth at bedtime. 04/22/19  Yes [provider]  naproxen (NAPROSYN) 500 MG tablet Take 500 mg by mouth 2 (two) times daily. 09/26/19  Yes [provider]  QUEtiapine (SEROQUEL) 25 MG tablet Take 25 mg by mouth 2 (two)  times daily.  09/05/19  Yes [provider]  SPIRIVA RESPIMAT 2.5 MCG/ACT AERS Inhale 2 puffs into the lungs daily.  10/08/19  Yes [provider]    Physical Exam: Vitals:   10/18/19 1700 10/18/19 1703 10/18/19 1730 10/18/19 1800  BP: (!) 159/107  (!) 142/94 (!) 124/91  Pulse: 95  98 100  Resp: (!) 22  (!) 22 (!) 23  Temp:      TempSrc:      SpO2: 97% 99% 98% 99%  Weight:      Height:        Constitutional: Mild to moderate increased work of breathing. Vitals:   10/18/19 1700 10/18/19 1703 10/18/19 1730 10/18/19 1800  BP: (!) 159/107  (!) 142/94 (!) 124/91  Pulse: 95  98 100  Resp: (!) 22  (!) 22 (!) 23  Temp:      TempSrc:      SpO2: 97% 99% 98% 99%  Weight:      Height:       Eyes: PERRL, lids and conjunctivae normal ENMT: Mucous membranes are moist.   Neck: normal, supple, no masses, no thyromegaly Respiratory: Diffuse expiratory wheezing, mild to moderate increased work of breathing and accessory muscle use.  Cardiovascular: Mildly tachycardic, regular rate and rhythm, no murmurs / rubs / gallops. No extremity edema. 2+ pedal pulses. No carotid bruits.  Abdomen: no tenderness, no masses palpated. No hepatosplenomegaly. Bowel sounds positive.  Musculoskeletal: no clubbing / cyanosis. No joint deformity upper and lower extremities. Good ROM, no contractures.   Skin: no rashes, lesions, ulcers. No induration Neurologic: No apparent cranial nerve abnormality, moving L extremities spontaneously. Psychiatric: Normal judgment and insight. Alert and oriented x 3. Normal mood.   Labs on Admission: I have personally reviewed following labs and imaging studies  CBC: Recent Labs  Lab 10/18/19 1455  WBC 5.8  NEUTROABS 3.9  HGB 11.8*  HCT 39.0  MCV 79.8*  PLT 228   Basic Metabolic Panel: Recent Labs  Lab 10/18/19 1455  NA 138  K 4.0  CL 107  CO2 24  GLUCOSE 133*  BUN 8  CREATININE 0.66  CALCIUM 8.6*   Liver Function Tests: Recent Labs  Lab  10/18/19 1455  AST 19  ALT 16  ALKPHOS 76  BILITOT 0.4  PROT 7.1  ALBUMIN 3.6   Radiological Exams on Admission: DG Chest Port 1 View  Result Date: 10/18/2019 CLINICAL DATA:  Shortness of breath EXAM: PORTABLE CHEST 1 VIEW COMPARISON:  None. FINDINGS: Lungs are clear. Heart is borderline enlarged with pulmonary vascularity normal. No adenopathy. No bone lesions. IMPRESSION: Borderline cardiac enlargement.  Lungs clear. Electronically Signed   By: Bretta Bang III M.D.   On: 10/18/2019 15:10    EKG: Sinus tachycardia.  QTc no prior EKG to compare.  Assessment/Plan Principal Problem:   Asthma exacerbation Active Problems:   Sleep apnea   Depression  Asthma exacerbation-continued wheezing, productive cough, dyspnea, O2 sats > 97% on room air. Negative for COVID-19 infection. Portable chest x-ray unremarkable. Prior admissions  in [place] for same, no history of intubations. -Albuterol nebulizer scheduled and PRN -Mucolytic's -125 mg Solu-Medrol given, continue 80mg  every 12 hourly -Social work consult. -I have extensively counseled patient on why she should get to COVID-19 vaccine. -peak flow meter -Resume Singulair, Spiriva. -Will need to establish care with a pulmonologist if she is plans to stay in [place] increased work of breathing, reevaluated, awake, alert oriented, still with diffuse expiratory wheezing, tachypneic, tachycardic, patient reports no improvement.  ABG-pH 7.33, PCO2-28, PO2 165.  Will admit to stepdown and start BiPAP, prophylactically as I fear she will tire out.   Tachycardia-likely from continuous nebulizer, frequent albuterol treatments.  EKG shows sinus tachycardia.  Sleep apnea-uses CPAP at home. - CPAP nightly  Depression-  -Resume home Seroquel, BuSpar, hydroxyzine, Lexapro  History of seizures-reports no seizures since 2019, she was taken off antiseizure medications and started on Flexeril for muscle  spasms.  History of headaches- admitted January 2021- [place] for same, etiology not identified, head CT, MRI unremarkable. -Resume Naproxen BID PRN  Morbid obesity  Rheumatoid arthritis-stable. -Resume home Plaquenil   DVT prophylaxis: Lovenox Code Status: Full code Family Communication: Cousin at bedside.  Disposition Plan: 1 to 2 days Consults called: None Admission status: Obs, stepdown   Bethena Roys MD [facility]  10/18/2019, 10:43 PM

## 2019-10-18 NOTE — Progress Notes (Signed)
Patient transferred to [facility] Unit. Belongings with patient.

## 2019-10-18 NOTE — Progress Notes (Signed)
CSW has reviewed patients chart and notes that patient is without primary care and/or insurance. CSW will follow up with patient regarding this matter and provide assistance as needed.   Elizabeth Bautista M. Rayhan Groleau LCSWA Transitions of Care  Clinical Social Worker  Ph: [phone] 

## 2019-10-18 NOTE — ED Triage Notes (Signed)
Pt c/o SOB over the last 2 weeks, worsening yesterday. Pt has audible wheezing. Unable to complete full sentences when talking. Pt has been using Albuterol nebulizer at home every 4 hours without relief. Pt went to Urgent Care today and they sent pt to ED.

## 2019-10-18 NOTE — ED Notes (Signed)
ED Provider at bedside. 

## 2019-10-18 NOTE — Progress Notes (Signed)
MEWs Score 4, Red. Pt labored breathing, wheezing bilaterally. RT on the floor to assess patient. Respirations 28 at this time. HR in the 120's. MD notified. Dr. Mariea Clonts on the floor to assess patient. New orders placed. EKG obtained. Respiratory at bedside to obtain ABG and give breathing treatment.

## 2019-10-18 NOTE — ED Provider Notes (Signed)
Blood pressure 136/77, pulse 83, temperature 98.2 F (36.8 C), temperature source Oral, resp. rate 20, height 5\' 5"  (1.651 m), weight 126.6 kg, last menstrual period 09/26/2019, SpO2 98 %.  Assuming care from Dr. 09/28/2019.  In short, Elizabeth Bautista is a 44 y.o. female with a chief complaint of Shortness of Breath .  Refer to the original H&P for additional details.  The current plan of care is to reassess after meds.  04:00 PM  Patient reevaluated and is feeling better overall.  She has some coarse wheezing now on exam after albuterol.  Labs and imaging reviewed.  Plan on Covid testing and when a negative result is back we will give a DuoNeb and reassess.   Mild improvement after neb. COVID negative. Upper airway type wheezing is typical of patient's asthma per patient. Will start CAT neb and admit. Doubt PE with similar presentations in the past and wheezing on exam.   Discussed patient's case with TRH to request admission. Patient and family (if present) updated with plan. Care transferred to [facility] service.  I reviewed all nursing notes, vitals, pertinent old records, EKGs, labs, imaging (as available).   CRITICAL CARE Performed by: [facility] Total critical care time: 35 minutes Critical care time was exclusive of separately billable procedures and treating other patients. Critical care was necessary to treat or prevent imminent or life-threatening deterioration. Critical care was time spent personally by me on the following activities: development of treatment plan with patient and/or surrogate as well as nursing, discussions with consultants, evaluation of patient's response to treatment, examination of patient, obtaining history from patient or surrogate, ordering and performing treatments and interventions, ordering and review of laboratory studies, ordering and review of radiographic studies, pulse oximetry and re-evaluation of patient's condition.  Maia Plan, MD Emergency Medicine    Donelle Baba, Alona Bene, MD 10/18/19 2154

## 2019-10-18 NOTE — Progress Notes (Signed)
Vitals obtained. MEWS score 4, Red. MD notified. New order for Bipap and transfer to stepdown unit.

## 2019-10-18 NOTE — ED Triage Notes (Signed)
Pt presents with c/o sob and asthma flare

## 2019-10-18 NOTE — ED Provider Notes (Signed)
[facility] EMERGENCY DEPARTMENT Provider Note   CSN: [id number] Arrival date & time: 10/18/19  1406     History Chief Complaint  Patient presents with  . Shortness of Breath    Elizabeth Bautista is a 44 y.o. female.  Patient has a history of asthma.  Patient states she has been using her inhaler every 4 hours for the last couple days.  Patient complains of shortness of breath  The history is provided by the patient. No language interpreter was used.  Shortness of Breath Severity:  Moderate Onset quality:  Sudden Timing:  Constant Progression:  Worsening Chronicity:  Recurrent Context: activity   Relieved by:  Nothing Associated symptoms: wheezing   Associated symptoms: no abdominal pain, no chest pain, no cough, no headaches and no rash        Past Medical History:  Diagnosis Date  . Anemia   . Asthma   . Seizures (HCC)    last seizure 2019  . Sleep apnea     There are no problems to display for this patient.   Past Surgical History:  Procedure Laterality Date  . APPENDECTOMY    . CESAREAN SECTION     x3     OB History   No obstetric history on file.     Family History  Family history unknown: Yes    Social History   Tobacco Use  . Smoking status: Former Smoker    Quit date: 06/02/2014    Years since quitting: 5.3  . Smokeless tobacco: Never Used  Substance Use Topics  . Alcohol use: Not Currently  . Drug use: Not Currently    Home Medications Prior to Admission medications   Medication Sig Start Date End Date Taking? Authorizing Provider  albuterol (VENTOLIN HFA) 108 (90 Base) MCG/ACT inhaler Inhale 1-2 puffs into the lungs every 6 (six) hours as needed for wheezing or shortness of breath.  08/26/19  Yes [provider]  busPIRone (BUSPAR) 7.5 MG tablet Take 7.5 mg by mouth 2 (two) times daily. 09/05/19  Yes [provider]  Cholecalciferol (VITAMIN D3) 10 MCG (400 UNIT) tablet Take 800 Units by mouth daily.  05/24/19  Yes  [provider]  cyclobenzaprine (FLEXERIL) 10 MG tablet Take 10 mg by mouth 3 (three) times daily as needed for muscle spasms.  08/27/19  Yes [provider]  escitalopram (LEXAPRO) 10 MG tablet Take 10 mg by mouth in the morning and at bedtime.  09/05/19  Yes [provider]  famotidine (PEPCID) 20 MG tablet Take 20 mg by mouth daily. 07/31/19  Yes [provider]  ferrous sulfate 325 (65 FE) MG tablet Take 325 mg by mouth 2 (two) times daily. 05/17/19  Yes [provider]  fluticasone (CUTIVATE) 0.005 % ointment Apply 1 application topically daily. 10/11/19  Yes [provider]  hydroxychloroquine (PLAQUENIL) 200 MG tablet Take 200 mg by mouth 2 (two) times daily. 07/31/19  Yes [provider]  hydrOXYzine (ATARAX/VISTARIL) 10 MG tablet Take 10 mg by mouth in the morning and at bedtime. 09/05/19  Yes [provider]  ipratropium-albuterol (DUONEB) 0.5-2.5 (3) MG/3ML SOLN Inhale 3 mLs into the lungs every 6 (six) hours as needed (for shortness of breath/wheezing).  07/31/19  Yes [provider]  montelukast (SINGULAIR) 10 MG tablet Take 10 mg by mouth at bedtime. 04/22/19  Yes [provider]  naproxen (NAPROSYN) 500 MG tablet Take 500 mg by mouth 2 (two) times daily. 09/26/19  Yes [provider]  QUEtiapine (SEROQUEL) 25 MG tablet Take 25 mg by mouth 2 (two) times daily.  09/05/19  Yes [provider]  SPIRIVA RESPIMAT 2.5 MCG/ACT AERS Inhale 2 puffs into the lungs daily.  10/08/19  Yes [provider]    Allergies    Phenytoin and Phenylbutazones  Review of Systems   Review of Systems  Constitutional: Negative for appetite change and fatigue.  HENT: Negative for congestion, ear discharge and sinus pressure.   Eyes: Negative for discharge.  Respiratory: Positive for shortness of breath and wheezing. Negative for cough.   Cardiovascular: Negative for chest pain.  Gastrointestinal:  Negative for abdominal pain and diarrhea.  Genitourinary: Negative for frequency and hematuria.  Musculoskeletal: Negative for back pain.  Skin: Negative for rash.  Neurological: Negative for seizures and headaches.  Psychiatric/Behavioral: Negative for hallucinations.    Physical Exam Updated Vital Signs BP (!) 145/60   Pulse (!) 103   Temp 98.2 F (36.8 C) (Oral)   Resp (!) 25   Ht 5\' 5"  (1.651 m)   Wt 126.6 kg   LMP 09/26/2019 (Approximate)   SpO2 97%   BMI 46.43 kg/m   Physical Exam Vitals reviewed.  Constitutional:      Appearance: She is well-developed.  HENT:     Head: Normocephalic.     Nose: Nose normal.  Eyes:     General: No scleral icterus.    Conjunctiva/sclera: Conjunctivae normal.  Neck:     Thyroid: No thyromegaly.  Cardiovascular:     Rate and Rhythm: Normal rate and regular rhythm.     Heart sounds: No murmur. No friction rub. No gallop.   Pulmonary:     Breath sounds: No stridor. Wheezing present. No rales.     Comments: Tachypnea Chest:     Chest wall: No tenderness.  Abdominal:     General: There is no distension.     Tenderness: There is no abdominal tenderness. There is no rebound.  Musculoskeletal:        General: Normal range of motion.     Cervical back: Neck supple.  Lymphadenopathy:     Cervical: No cervical adenopathy.  Skin:    Findings: No erythema or rash.  Neurological:     Mental Status: She is alert and oriented to person, place, and time.     Motor: No abnormal muscle tone.     Coordination: Coordination normal.  Psychiatric:        Behavior: Behavior normal.     ED Results / Procedures / Treatments   Labs (all labs ordered are listed, but only abnormal results are displayed) Labs Reviewed  CBC WITH DIFFERENTIAL/PLATELET - Abnormal; Notable for the following components:      Result Value   Hemoglobin 11.8 (*)    MCV 79.8 (*)    MCH 24.1 (*)    RDW 15.9 (*)    All other components within normal limits  SARS  CORONAVIRUS 2 BY RT PCR (HOSPITAL ORDER, PERFORMED IN [facility])  COMPREHENSIVE METABOLIC PANEL    EKG None  Radiology DG Chest Port 1 View  Result Date: 10/18/2019 CLINICAL DATA:  Shortness of breath EXAM: PORTABLE CHEST 1 VIEW COMPARISON:  None. FINDINGS: Lungs are clear. Heart is borderline enlarged with pulmonary vascularity normal. No adenopathy. No bone lesions. IMPRESSION: Borderline cardiac enlargement.  Lungs clear. Electronically Signed   By: 10/20/2019 III M.D.   On: 10/18/2019 15:10    Procedures Procedures (including critical care time)  Medications Ordered in ED Medications  magnesium sulfate IVPB 2 g 50 mL (2 g Intravenous New Bag/Given 10/18/19 1456)  LORazepam (ATIVAN) injection 0.5 mg (has no administration in time range)  LORazepam (ATIVAN) injection 0.5 mg (0.5 mg Intravenous Given 10/18/19 1456)  albuterol (VENTOLIN HFA) 108 (90 Base) MCG/ACT inhaler 2 puff (2 puffs Inhalation Given by Other 10/18/19 1444)  methylPREDNISolone sodium succinate (SOLU-MEDROL) 125 mg/2 mL injection 125 mg (125 mg Intravenous Given 10/18/19 1456)  sodium chloride 0.9 % bolus 500 mL (500 mLs Intravenous New Bag/Given 10/18/19 1456)    ED Course  I have reviewed the triage vital signs and the nursing notes.  Pertinent labs & imaging results that were available during my care of the patient were reviewed by me and considered in my medical decision making (see chart for details).    MDM Rules/Calculators/A&P                      Patient with exacerbation of her asthma.      This patient presents to the ED for concern of short of breath this involves an extensive number of treatment options, and is a complaint that carries with it a high risk of complications and morbidity.  The differential diagnosis includes asthma pneumonia   Lab Tests:   I Ordered, reviewed, and interpreted labs, which included CBC and chemistries which were unremarkable except for mild  anemia  Medicines ordered:   I ordered medication Solu-Medrol and albuterol  Imaging Studies ordered:   I ordered imaging studies which included chest x-ray and  I independently visualized and interpreted imaging which showed no acute disease  Additional history obtained:   Additional history obtained from old records  Previous records obtained and reviewed   Consultations Obtained:   My colleague consulted hospitalist and discussed lab and imaging findings  Reevaluation:  After the interventions stated above, I reevaluated the patient and found mild improvement  Critical Interventions:  .   Final Clinical Impression(s) / ED Diagnoses Final diagnoses:  None    Rx / DC Orders ED Discharge Orders    None       Milton Ferguson, MD 10/19/19 1807

## 2019-10-18 NOTE — ED Notes (Signed)
Respiratory contacted for nebulizer treatment. ° °

## 2019-10-18 NOTE — Progress Notes (Signed)
Patient new admit to Unit 300. Upon admission MEWS score 3, Yellow. Heart rate 123, Respirations 22 at rest. O2 sat 99% on room air. Patient breathing labored, shortness of breath at rest. Wheezing noted bilaterally. Nonproductive cough. MD notified. New order placed for cardiac monitoring and EKG.

## 2019-10-19 DIAGNOSIS — Z87891 Personal history of nicotine dependence: Secondary | ICD-10-CM | POA: Diagnosis not present

## 2019-10-19 DIAGNOSIS — Z833 Family history of diabetes mellitus: Secondary | ICD-10-CM | POA: Diagnosis not present

## 2019-10-19 DIAGNOSIS — J45901 Unspecified asthma with (acute) exacerbation: Principal | ICD-10-CM

## 2019-10-19 DIAGNOSIS — R0602 Shortness of breath: Secondary | ICD-10-CM | POA: Diagnosis present

## 2019-10-19 DIAGNOSIS — M069 Rheumatoid arthritis, unspecified: Secondary | ICD-10-CM

## 2019-10-19 DIAGNOSIS — G4733 Obstructive sleep apnea (adult) (pediatric): Secondary | ICD-10-CM | POA: Diagnosis present

## 2019-10-19 DIAGNOSIS — Z888 Allergy status to other drugs, medicaments and biological substances status: Secondary | ICD-10-CM | POA: Diagnosis not present

## 2019-10-19 DIAGNOSIS — R490 Dysphonia: Secondary | ICD-10-CM | POA: Diagnosis present

## 2019-10-19 DIAGNOSIS — D649 Anemia, unspecified: Secondary | ICD-10-CM | POA: Diagnosis present

## 2019-10-19 DIAGNOSIS — Z6841 Body Mass Index (BMI) 40.0 and over, adult: Secondary | ICD-10-CM | POA: Diagnosis not present

## 2019-10-19 DIAGNOSIS — J383 Other diseases of vocal cords: Secondary | ICD-10-CM | POA: Diagnosis present

## 2019-10-19 DIAGNOSIS — K219 Gastro-esophageal reflux disease without esophagitis: Secondary | ICD-10-CM | POA: Diagnosis present

## 2019-10-19 DIAGNOSIS — Z20822 Contact with and (suspected) exposure to covid-19: Secondary | ICD-10-CM | POA: Diagnosis present

## 2019-10-19 DIAGNOSIS — F329 Major depressive disorder, single episode, unspecified: Secondary | ICD-10-CM | POA: Diagnosis present

## 2019-10-19 DIAGNOSIS — E1165 Type 2 diabetes mellitus with hyperglycemia: Secondary | ICD-10-CM | POA: Diagnosis present

## 2019-10-19 DIAGNOSIS — F419 Anxiety disorder, unspecified: Secondary | ICD-10-CM | POA: Diagnosis present

## 2019-10-19 DIAGNOSIS — Z79899 Other long term (current) drug therapy: Secondary | ICD-10-CM | POA: Diagnosis not present

## 2019-10-19 DIAGNOSIS — Z886 Allergy status to analgesic agent status: Secondary | ICD-10-CM | POA: Diagnosis not present

## 2019-10-19 DIAGNOSIS — Z8249 Family history of ischemic heart disease and other diseases of the circulatory system: Secondary | ICD-10-CM | POA: Diagnosis not present

## 2019-10-19 LAB — MAGNESIUM: Magnesium: 2 mg/dL (ref 1.7–2.4)

## 2019-10-19 LAB — URINALYSIS, ROUTINE W REFLEX MICROSCOPIC
Bilirubin Urine: NEGATIVE
Glucose, UA: 150 mg/dL — AB
Hgb urine dipstick: NEGATIVE
Ketones, ur: 5 mg/dL — AB
Leukocytes,Ua: NEGATIVE
Nitrite: NEGATIVE
Protein, ur: NEGATIVE mg/dL
Specific Gravity, Urine: 1.03 (ref 1.005–1.030)
pH: 5 (ref 5.0–8.0)

## 2019-10-19 LAB — BASIC METABOLIC PANEL
Anion gap: 10 (ref 5–15)
BUN: 9 mg/dL (ref 6–20)
CO2: 19 mmol/L — ABNORMAL LOW (ref 22–32)
Calcium: 8.9 mg/dL (ref 8.9–10.3)
Chloride: 107 mmol/L (ref 98–111)
Creatinine, Ser: 0.6 mg/dL (ref 0.44–1.00)
GFR calc Af Amer: >60 mL/min (ref >60)
GFR calc non Af Amer: >60 mL/min (ref >60)
Glucose, Bld: 226 mg/dL — ABNORMAL HIGH (ref 70–99)
Potassium: 4.3 mmol/L (ref 3.5–5.1)
Sodium: 136 mmol/L (ref 135–145)

## 2019-10-19 LAB — HIV ANTIBODY (ROUTINE TESTING W REFLEX): HIV Screen 4th Generation wRfx: NONREACTIVE

## 2019-10-19 LAB — MRSA PCR SCREENING: MRSA by PCR: NEGATIVE

## 2019-10-19 LAB — GLUCOSE, CAPILLARY
Glucose-Capillary: 297 mg/dL — ABNORMAL HIGH (ref 70–99)
Glucose-Capillary: 267 mg/dL — ABNORMAL HIGH (ref 70–99)

## 2019-10-19 LAB — HEMOGLOBIN A1C
Hgb A1c MFr Bld: 6.6 % — ABNORMAL HIGH (ref 4.8–5.6)
Mean Plasma Glucose: 142.72 mg/dL

## 2019-10-19 MED ORDER — INSULIN ASPART 100 UNIT/ML ~~LOC~~ SOLN
0.0000 [IU] | Freq: Three times a day (TID) | SUBCUTANEOUS | Status: DC
  Administered 2019-10-19: 8 [IU] via SUBCUTANEOUS
  Administered 2019-10-20 – 2019-10-21 (×5): 5 [IU] via SUBCUTANEOUS

## 2019-10-19 MED ORDER — METHYLPREDNISOLONE SODIUM SUCC 125 MG IJ SOLR
60.0000 mg | Freq: Three times a day (TID) | INTRAMUSCULAR | Status: DC
  Administered 2019-10-19: 60 mg via INTRAVENOUS
  Filled 2019-10-19: qty 2

## 2019-10-19 MED ORDER — PANTOPRAZOLE SODIUM 40 MG PO TBEC
40.0000 mg | DELAYED_RELEASE_TABLET | Freq: Two times a day (BID) | ORAL | Status: DC
  Administered 2019-10-19 – 2019-10-23 (×8): 40 mg via ORAL
  Filled 2019-10-19 (×8): qty 1

## 2019-10-19 MED ORDER — CHLORHEXIDINE GLUCONATE CLOTH 2 % EX PADS
6.0000 | MEDICATED_PAD | Freq: Every day | CUTANEOUS | Status: DC
  Administered 2019-10-19 – 2019-10-21 (×3): 6 via TOPICAL

## 2019-10-19 MED ORDER — MORPHINE SULFATE (PF) 2 MG/ML IV SOLN
2.0000 mg | Freq: Once | INTRAVENOUS | Status: AC
  Administered 2019-10-19: 2 mg via INTRAVENOUS
  Filled 2019-10-19: qty 1

## 2019-10-19 MED ORDER — MAGNESIUM SULFATE 2 GM/50ML IV SOLN
2.0000 g | Freq: Once | INTRAVENOUS | Status: AC
  Administered 2019-10-19: 2 g via INTRAVENOUS
  Filled 2019-10-19: qty 50

## 2019-10-19 MED ORDER — BUDESONIDE 0.5 MG/2ML IN SUSP
0.5000 mg | Freq: Two times a day (BID) | RESPIRATORY_TRACT | Status: DC
  Administered 2019-10-19: 0.5 mg via RESPIRATORY_TRACT
  Filled 2019-10-19: qty 2

## 2019-10-19 MED ORDER — ARFORMOTEROL TARTRATE 15 MCG/2ML IN NEBU
15.0000 ug | INHALATION_SOLUTION | Freq: Two times a day (BID) | RESPIRATORY_TRACT | Status: DC
  Administered 2019-10-19 – 2019-10-21 (×5): 15 ug via RESPIRATORY_TRACT
  Filled 2019-10-19 (×4): qty 2

## 2019-10-19 MED ORDER — FAMOTIDINE 20 MG PO TABS
20.0000 mg | ORAL_TABLET | Freq: Every day | ORAL | Status: DC
  Administered 2019-10-20 – 2019-10-22 (×3): 20 mg via ORAL
  Filled 2019-10-19 (×3): qty 1

## 2019-10-19 MED ORDER — INSULIN DETEMIR 100 UNIT/ML ~~LOC~~ SOLN
5.0000 [IU] | Freq: Every day | SUBCUTANEOUS | Status: DC
  Administered 2019-10-19: 5 [IU] via SUBCUTANEOUS
  Filled 2019-10-19 (×2): qty 0.05

## 2019-10-19 MED ORDER — METHYLPREDNISOLONE SODIUM SUCC 125 MG IJ SOLR
80.0000 mg | Freq: Two times a day (BID) | INTRAMUSCULAR | Status: DC
  Administered 2019-10-19 – 2019-10-21 (×4): 80 mg via INTRAVENOUS
  Filled 2019-10-19 (×4): qty 2

## 2019-10-19 MED ORDER — HYDRALAZINE HCL 20 MG/ML IJ SOLN: 10.0000 mg | Freq: Three times a day (TID) | INTRAMUSCULAR | Status: DC | PRN

## 2019-10-19 NOTE — Progress Notes (Addendum)
PROGRESS NOTE    Elizabeth Bautista  NKN:[id number] DOB: 29-Aug-1975 DOA: 10/18/2019 PCP: System, Pcp Not In   Chief Complaint  Patient presents with  . Shortness of Breath    Brief Narrative:  44 y.o. female with medical history significant for asthma, anemia, seizures, sleep apnea.  Patient presented to the ED with complaints of difficulty breathing started 2 weeks ago.  She also reports wheezing, cough productive of clear sputum.  She reports this is consistent with her asthma exacerbations. No chest pains, no lower extremity swelling. Never been diagnosed with COVID-19 infection, and has not received her COVID vaccination. Patient lives in [place], and has been in [place] visiting for the past 2 months. Is undecided if she is going to move to [place]. She reports admissions in the past for asthma exacerbations in [place]. She has never required intubated.  ED Course: O2 sats 98% on room air temperature 98.2.  WBC 5.8.  Portable chest x-ray without acute abnormality.  COVID-19 test negative.  DuoNebs, hour-long albuterol nebulizer, Ativan 0.5 mg, Solu-Medrol 125 mg, magnesium, given, still with significant wheezing and increased work of breathing hence hospitalist was called for admission for asthma exacerbation.  Assessment & Plan: 1-Asthma exacerbation -Continue experiencing shortness of breath with exertion, dizziness echodensity and wheezing and tachypnea. -Good oxygen saturation -Overnight required transient use of BiPAP. -Unable to speak in full sentences. -Started the use of Pulmicort and Brovana -Continue DuoNeb nebulizer management, use of his steroids continue -Encouraged to use as noted above. -X-ray demonstrated no acute infiltrates. -Pulmonology service has been consulted for further assistance and recommendations in patient's management. High risk of tiring herself out and require intubation. -continue singulair   2-morbid obesity and sleep apnea -continue  BIPAP QHS -Body mass index is 49.56 kg/m. -Currently diet, portion control and increase physical activity discussed with patient.  3-anxiety/Depression -No suicidal ideation or hallucination -Continue Seroquel, BuSpar, Lexapro and as needed hydroxyzine.  4-Rheumatoid arthritis ([place]) -continue plaquenil -will check ESR  5-GERD/GI prophylaxis -continue Pepcid.  6-hyperglycemia -no prior hx of diabetes -positive family hx and obesity -steroids most likely contributing -will use SSI and check A1C.   DVT prophylaxis: Lovenox Code Status: Full code Family Communication: No family at bedside. Disposition:   Status is: Inpatient  Dispo: The patient is from:               Anticipated d/c is to: Home              Anticipated d/c date is: 10/21/19              Patient currently not medically stable, overnight recording BS alignment admitted still easily short of breath, unable to speak in full sentences and with diffuse expiratory wheezing.       Consultants:  Pulmonary service    Procedures:  See below for x-ray reports.   Antimicrobials:  None   Subjective: Unable to speak in complete sentences; steel lady short of breath and with tachypnea with minimal exertion; diffuse expiratory wheezes on examination.  Objective: Vitals:   10/19/19 0900 10/19/19 1000 10/19/19 1100 10/19/19 1200  BP: (!) 146/88 (!) 162/95 (!) 145/78 (!) 146/77  Pulse: 78 84 95 97  Resp: 20 (!) 22 17 18   Temp:      TempSrc:      SpO2: 100% 100% 100% 98%  Weight:      Height:        Intake/Output Summary (Last 24 hours) at 10/19/2019 1325  Last data filed at 10/19/2019 1200 Gross per 24 hour  Intake 790 ml  Output 1100 ml  Net -310 ml   Filed Weights   10/18/19 1434 10/18/19 2023 10/18/19 2356  Weight: 126.6 kg 134 kg 135.1 kg    Examination:  General exam: No fever, unable to speak in full sentences; mild to moderate respiratory distress with minimal exertion.  Diffuse expiratory  wheezing.  No chest pain, no nausea vomiting requires the use of BiPAP overnight. Respiratory system: Diffuse expiratory wheezing bilaterally; decreased breath sounds at the bases.  Positive tachypnea with minimal exertion.  No using accessory muscles Cardiovascular system: Sinus tachycardia, no rubs, no gallops, no murmurs Given Gastrointestinal system: Abdomen is obese, nondistended, soft and nontender. No organomegaly or masses felt. Normal bowel sounds heard. Central nervous system: Alert and oriented. No focal neurological deficits. Extremities: Symmetric 5 x 5 power. Skin: No rashes, lesions or ulcers Psychiatry: Judgement and insight appear normal. Mood & affect appropriate.     Data Reviewed: I have personally reviewed following labs and imaging studies  CBC: Recent Labs  Lab 10/18/19 1455  WBC 5.8  NEUTROABS 3.9  HGB 11.8*  HCT 39.0  MCV 79.8*  PLT 347    Basic Metabolic Panel: Recent Labs  Lab 10/18/19 1455 10/19/19 0748  NA 138 136  K 4.0 4.3  CL 107 107  CO2 24 19*  GLUCOSE 133* 226*  BUN 8 9  CREATININE 0.66 0.60  CALCIUM 8.6* 8.9  MG  --  2.0    GFR: Estimated Creatinine Clearance: 123.6 mL/min (by C-G formula based on SCr of 0.6 mg/dL).  Liver Function Tests: Recent Labs  Lab 10/18/19 1455  AST 19  ALT 16  ALKPHOS 76  BILITOT 0.4  PROT 7.1  ALBUMIN 3.6    CBG: No results for input(s): GLUCAP in the last 168 hours.   Recent Results (from the past 240 hour(s))  SARS Coronavirus 2 by RT PCR (hospital order, performed in [facility] hospital lab) Nasopharyngeal Nasopharyngeal Swab     Status: None   Collection Time: 10/18/19  2:41 PM   Specimen: Nasopharyngeal Swab  Result Value Ref Range Status   SARS Coronavirus 2 NEGATIVE NEGATIVE Final    Comment: (NOTE) SARS-CoV-2 target nucleic acids are NOT DETECTED. The SARS-CoV-2 RNA is generally detectable in upper and lower respiratory specimens during the acute phase of infection. The  lowest concentration of SARS-CoV-2 viral copies this assay can detect is 250 copies / mL. A negative result does not preclude SARS-CoV-2 infection and should not be used as the sole basis for treatment or other patient management decisions.  A negative result may occur with improper specimen collection / handling, submission of specimen other than nasopharyngeal swab, presence of viral mutation(s) within the areas targeted by this assay, and inadequate number of viral copies (<250 copies / mL). A negative result must be combined with clinical observations, patient history, and epidemiological information. Fact Sheet for Patients:   StrictlyIdeas.no Fact Sheet for Healthcare Providers: BankingDealers.co.za This test is not yet approved or cleared  by the [place] FDA and has been authorized for detection and/or diagnosis of SARS-CoV-2 by FDA under an Emergency Use Authorization (EUA).  This EUA will remain in effect (meaning this test can be used) for the duration of the COVID-19 declaration under Section 564(b)(1) of the Act, 21 U.S.C. section 360bbb-3(b)(1), unless the authorization is terminated or revoked sooner. Performed at [facility], [address] [place], [place] [address]  MRSA PCR Screening     Status: None   Collection Time: 10/18/19 11:17 PM   Specimen: Nasal Mucosa; Nasopharyngeal  Result Value Ref Range Status   MRSA by PCR NEGATIVE NEGATIVE Final    Comment:        The GeneXpert MRSA Assay (FDA approved for NASAL specimens only), is one component of a comprehensive MRSA colonization surveillance program. It is not intended to diagnose MRSA infection nor to guide or monitor treatment for MRSA infections. Performed at [facility], [address] [place], [place] [address]      Radiology Studies: [facility] Chest [facility] 1 View  Result Date: 10/18/2019 CLINICAL DATA:  Shortness of breath EXAM: PORTABLE CHEST 1 VIEW  COMPARISON:  None. FINDINGS: Lungs are clear. Heart is borderline enlarged with pulmonary vascularity normal. No adenopathy. No bone lesions. IMPRESSION: Borderline cardiac enlargement.  Lungs clear. Electronically Signed   By: Lowella Grip III M.D.   On: 10/18/2019 15:10    Scheduled Meds: . arformoterol  15 mcg Nebulization BID  . budesonide (PULMICORT) nebulizer solution  0.5 mg Nebulization BID  . busPIRone  7.5 mg Oral BID  . Chlorhexidine Gluconate Cloth  6 each Topical Daily  . enoxaparin (LOVENOX) injection  0.5 mg/kg Subcutaneous Q24H  . escitalopram  10 mg Oral QHS  . famotidine  20 mg Oral Daily  . guaiFENesin-dextromethorphan  10 mL Oral Q6H  . hydroxychloroquine  200 mg Oral BID  . hydrOXYzine  10 mg Oral BID  . ipratropium-albuterol  3 mL Nebulization Q6H  . methylPREDNISolone (SOLU-MEDROL) injection  60 mg Intravenous Q8H  . montelukast  10 mg Oral QHS  . naproxen  500 mg Oral BID  . QUEtiapine  25 mg Oral BID   Continuous Infusions:   LOS: 0 days    Time spent: 35 minutes    Barton Dubois, MD [facility]   To contact the attending provider between 7A-7P or the covering provider during after hours 7P-7A, please log into the web site [email or web address] and access using universal [place] password for that web site. If you do not have the password, please call the hospital operator.  10/19/2019, 1:25 PM

## 2019-10-19 NOTE — Progress Notes (Signed)
Notified MD about pt's sudden onset of new left breast sharp pain, rates 8/10; nurse obtained EKG and gave 1x dose of 2mg  morphine; nurse will continue to monitor.

## 2019-10-19 NOTE — Consult Note (Signed)
Elizabeth Bautista, MRN:  [id number], DOB:  1975/12/29, LOS: 0 ADMISSION DATE:  10/18/2019, CONSULTATION DATE:  5/19 REFERRING MD:  [facility], CHIEF COMPLAINT:  Sob    Brief History   58 yobf with allergies as child and new onset "asthma" during difficult delivery 2000 and "never right since despite quit smoking in 2016 much worse since summer 2020 req req pred rx the most recent was 2 weeks PTA with severe resp distress > admit ICU with dx refractory asthma   History of present illness   Elizabeth Bautista is a 44 y.o. female with medical history significant for asthma, anemia, seizures, sleep apnea.  Patient presented to the ED with complaints of difficulty breathing started 2 weeks  PTA .  She also reports wheezing, cough productive of clear sputum.  She reports this is consistent with her asthma exacerbations. No chest pains, no lower extremity swelling. Never been diagnosed with COVID-19 infection, and has not received her COVID vaccination. Patient lives in [place], and has been in [place] visiting for the past 2 months. Is undecided if she is going to move to [place]. She reports admissions in the past for asthma exacerbations in [place]. She has never required intubation.  ED Course: O2 sats 98% on room air temperature 98.2.  WBC 5.8.  Portable chest x-ray without acute abnormality.  COVID-19 test negative.  DuoNebs, hour-long albuterol nebulizer, Ativan 0.5 mg, Solu-Medrol 125 mg, magnesium, given, still with significant wheezing and increased work of breathing hence hospitalist was called for admission for asthma exacerbation  No obvious patterns in  day to day or daytime variability or assoc excess/ purulent sputum or mucus plugs or hemoptysis or cp or chest tightness, subjective wheeze or overt sinus or hb symptoms.     Also denies any obvious fluctuation of symptoms with weather or environmental changes or other aggravating or alleviating factors except as outlined above    No unusual exposure hx or h/o childhood pna/ asthma or knowledge of premature birth.  Current Allergies, Complete Past Medical History, Past Surgical History, Family History, and Social History were reviewed in [employer] record.  ROS  The following are not active complaints unless bolded Hoarseness, sore throat, dysphagia, dental problems, itching, sneezing,  nasal congestion or discharge of excess mucus or purulent secretions, ear ache,   fever, chills, sweats, unintended wt loss or wt gain, classically pleuritic or exertional cp,  orthopnea pnd or arm/hand swelling  or leg swelling, presyncope, palpitations, abdominal pain, anorexia, nausea, vomiting, diarrhea  or change in bowel habits or change in bladder habits, change in stools or change in urine, dysuria, hematuria,  rash, arthralgias, visual complaints, headache, numbness, weakness or ataxia or problems with walking or coordination,  change in mood or  memory.           Past Medical History  RA  Plaquenil dependent  Morbid obesity/ osa   Significant Hospital Events     Consults:  PCCM  Procedures:    Significant Diagnostic Tests:     Micro Data:  COVID 19 swab  5/18 neg  MRSA  5/18 >  Neg   Antimicrobials:    Scheduled Meds: . arformoterol  15 mcg Nebulization BID  . budesonide (PULMICORT) nebulizer solution  0.5 mg Nebulization BID  . busPIRone  7.5 mg Oral BID  . Chlorhexidine Gluconate Cloth  6 each Topical Daily  . enoxaparin (LOVENOX) injection  0.5 mg/kg Subcutaneous Q24H  . escitalopram  10 mg Oral QHS  . famotidine  20 mg Oral Daily  . guaiFENesin-dextromethorphan  10 mL Oral Q6H  . hydroxychloroquine  200 mg Oral BID  . hydrOXYzine  10 mg Oral BID  . ipratropium-albuterol  3 mL Nebulization Q6H  . methylPREDNISolone (SOLU-MEDROL) injection  60 mg Intravenous Q8H  . montelukast  10 mg Oral QHS  . naproxen  500 mg Oral BID  . QUEtiapine  25 mg Oral BID   Continuous  Infusions: PRN Meds:.acetaminophen **OR** acetaminophen, cyclobenzaprine, hydrALAZINE, polyethylene glycol, promethazine  Interim history/subjective:  Sob at rest with dry cough   Objective   Blood pressure (!) 142/82, pulse 86, temperature 98.3 F (36.8 C), temperature source Oral, resp. rate 20, height 5\' 5"  (1.651 m), weight 135.1 kg, last menstrual period 09/26/2019, SpO2 98 %.        Intake/Output Summary (Last 24 hours) at 10/19/2019 1400 Last data filed at 10/19/2019 1200 Gross per 24 hour  Intake 790 ml  Output 1100 ml  Net -310 ml   Filed Weights   10/18/19 1434 10/18/19 2023 10/18/19 2356  Weight: 126.6 kg 134 kg 135.1 kg    Examination: Tmax 98  General: very hoarse bf 30 degrees hob Lungs: mostly pseudowheeze, better with plm  Cardiovascular: RRR no s3 Abdomen: obese/ soft Extremities: warm s edema Neuro: intact sensorium, good recall    I personally reviewed images and agree with radiology impression as follows:  pCXR:   5/18  Borderline BM/ clear lungs     Assessment & Plan:   1) 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 - onset at loss of baby 2000, worse since quit smoking strongly suggestive of pyschological component but also has allergy backgroud   DDX of  difficult airways management almost all start with A and  include Adherence, Ace Inhibitors, Acid Reflux, Active Sinus Disease, Alpha 1 Antitripsin deficiency, Anxiety masquerading as Airways dz,  ABPA,  Allergy(esp in young), Aspiration (esp in elderly), Adverse effects of meds,  Active smoking or vaping, A bunch of PE's (a small clot burden can't cause this syndrome unless there is already severe underlying pulm or vascular dz with poor reserve) plus two Bs  = Bronchiectasis and Beta blocker use..and one C= CHF   Adherence is always the initial "prime suspect" and is a multilayered concern that requires a "trust but verify" approach in every patient - starting with  knowing how to use medications, especially inhalers, correctly, keeping up with refills and understanding the fundamental difference between maintenance and prns vs those medications only taken for a very short course and then stopped and not refilled.  - will need to return to office with all meds in hand using a trust but verify approach to confirm accurate Medication  Reconciliation The principal here is that until we are certain that the  patients are doing what we've asked, it makes no sense to ask them to do more.   ? Acid (or non-acid) GERD > always difficult to exclude as up to 75% of pts in some series report no assoc GI/ Heartburn symptoms> rec max (24h)  acid suppression and diet restrictions/ reviewed and instructions given in writing.   ? Anxiety/depression  > usually at the bottom of this list of usual suspects but should be much higher on this pt's based on H and P and note already on psychotropics and may interfere with adherence and also interpretation of response or lack thereof to symptom  management which can be quite subjective.   Allergy/asthma > continue singulair/ duoneb/solumedrol and stop pulmocort for now as may aggrate UA issues   ? ABPA > f/u as outpt to sort out   ? chf > cm on cxr, check bnp    2) RA ? 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 Recent Labs  Lab 10/18/19 1455  WBC 5.8    Liver Function Tests: Recent Labs  Lab  10/18/19 1455  AST 19  ALT 16  ALKPHOS 76  BILITOT 0.4  PROT 7.1  ALBUMIN 3.6   No results for input(s): LIPASE, AMYLASE in the last 168 hours. No results for input(s): AMMONIA in the last 168 hours.  ABG    Component Value Date/Time   PHART 7.336 (L) 10/18/2019 2100   PCO2ART 28.7 (L) 10/18/2019 2100   PO2ART 165 (H) 10/18/2019 2100   HCO3 17.1 (L) 10/18/2019 2100   ACIDBASEDEF 9.8 (H) 10/18/2019 2100   O2SAT 99.1 10/18/2019 2100     Coagulation Profile: No results for input(s): INR, PROTIME in the last 168 hours.  Cardiac Enzymes: No results for input(s): CKTOTAL, CKMB, CKMBINDEX, TROPONINI in the last 168 hours.  HbA1C: No results found for: HGBA1C  CBG: No results for input(s): GLUCAP in the last 168 hours.     Past Medical History  She,  has a past medical history of Anemia, Asthma, Seizures (HCC), and Sleep apnea.   Surgical History    Past Surgical History:  Procedure Laterality Date  . APPENDECTOMY    . CESAREAN SECTION     x3     Social History   reports that she quit smoking about 5 years ago. She has never used smokeless tobacco. She reports previous alcohol use. She reports previous drug use.   Family History   Her Family history is unknown by patient.   Allergies Allergies  Allergen Reactions  . Phenytoin Anaphylaxis  . Phenylbutazones      Home Medications  Prior to Admission medications   Medication Sig Start Date End Date Taking? Authorizing Provider  albuterol (VENTOLIN HFA) 108 (90 Base) MCG/ACT inhaler Inhale 1-2 puffs into the lungs every 6 (six) hours as needed for wheezing or shortness of breath.  08/26/19  Yes [provider]  busPIRone (BUSPAR) 7.5 MG tablet Take 7.5 mg by mouth 2 (two) times daily. 09/05/19  Yes [provider]  Cholecalciferol (VITAMIN D3) 10 MCG (400 UNIT) tablet Take 800 Units by mouth daily.  05/24/19  Yes [provider]  cyclobenzaprine (FLEXERIL) 10 MG tablet Take 10 mg by  mouth 3 (three) times daily as needed for muscle spasms.  08/27/19  Yes [provider]  escitalopram (LEXAPRO) 10 MG tablet Take 10 mg by mouth in the morning and at bedtime.  09/05/19  Yes [provider]  famotidine (PEPCID) 20 MG tablet Take 20 mg by mouth daily. 07/31/19  Yes [provider]  ferrous sulfate 325 (65 FE) MG tablet Take 325 mg by mouth 2 (two) times daily. 05/17/19  Yes [provider]  fluticasone (CUTIVATE) 0.005 % ointment Apply 1 application topically daily. 10/11/19  Yes [provider]  hydroxychloroquine (PLAQUENIL) 200 MG tablet Take 200 mg by mouth 2 (two) times daily. 07/31/19  Yes [provider]  hydrOXYzine (ATARAX/VISTARIL) 10 MG tablet Take 10 mg by mouth in the morning and at bedtime. 09/05/19  Yes [provider]  ipratropium-albuterol (DUONEB) 0.5-2.5 (3) MG/3ML SOLN Inhale 3 mLs into the lungs every 6 (six) hours as needed (for shortness of breath/wheezing).  07/31/19  Yes [provider]  montelukast (SINGULAIR) 10 MG tablet Take 10 mg by mouth at bedtime. 04/22/19  Yes [provider]  naproxen (NAPROSYN) 500 MG tablet Take 500 mg by mouth 2 (two) times daily. 09/26/19  Yes [provider]  QUEtiapine (SEROQUEL) 25 MG tablet Take 25 mg by mouth 2 (two) times daily.  09/05/19  Yes [provider]  SPIRIVA RESPIMAT 2.5 MCG/ACT AERS Inhale 2 puffs into the lungs daily.  10/08/19  Yes [provider]         Sandrea Hughs, MD Pulmonary and Critical Care Medicine  Healthcare Cell [phone] After 6:00 PM or weekends, use Beeper [phone]  After 7:00 pm call Elink  [phone]

## 2019-10-20 ENCOUNTER — Inpatient Hospital Stay (HOSPITAL_COMMUNITY): Payer: PRIVATE HEALTH INSURANCE

## 2019-10-20 DIAGNOSIS — G4730 Sleep apnea, unspecified: Secondary | ICD-10-CM

## 2019-10-20 LAB — BASIC METABOLIC PANEL
Anion gap: 9 (ref 5–15)
BUN: 13 mg/dL (ref 6–20)
CO2: 24 mmol/L (ref 22–32)
Calcium: 8.7 mg/dL — ABNORMAL LOW (ref 8.9–10.3)
Chloride: 105 mmol/L (ref 98–111)
Creatinine, Ser: 0.68 mg/dL (ref 0.44–1.00)
GFR calc Af Amer: >60 mL/min (ref >60)
GFR calc non Af Amer: >60 mL/min (ref >60)
Glucose, Bld: 269 mg/dL — ABNORMAL HIGH (ref 70–99)
Potassium: 5.1 mmol/L (ref 3.5–5.1)
Sodium: 138 mmol/L (ref 135–145)

## 2019-10-20 LAB — CBC
HCT: 38.6 % (ref 36.0–46.0)
Hemoglobin: 11.4 g/dL — ABNORMAL LOW (ref 12.0–15.0)
MCH: 23.7 pg — ABNORMAL LOW (ref 26.0–34.0)
MCHC: 29.5 g/dL — ABNORMAL LOW (ref 30.0–36.0)
MCV: 80.2 fL (ref 80.0–100.0)
Platelets: 219 10*3/uL (ref 150–400)
RBC: 4.81 MIL/uL (ref 3.87–5.11)
RDW: 16.4 % — ABNORMAL HIGH (ref 11.5–15.5)
WBC: 14.4 10*3/uL — ABNORMAL HIGH (ref 4.0–10.5)
nRBC: 0 % (ref 0.0–0.2)

## 2019-10-20 LAB — GLUCOSE, CAPILLARY
Glucose-Capillary: 234 mg/dL — ABNORMAL HIGH (ref 70–99)
Glucose-Capillary: 214 mg/dL — ABNORMAL HIGH (ref 70–99)
Glucose-Capillary: 237 mg/dL — ABNORMAL HIGH (ref 70–99)
Glucose-Capillary: 224 mg/dL — ABNORMAL HIGH (ref 70–99)

## 2019-10-20 MED ORDER — INSULIN DETEMIR 100 UNIT/ML ~~LOC~~ SOLN
10.0000 [IU] | Freq: Every day | SUBCUTANEOUS | Status: DC
  Administered 2019-10-20 – 2019-10-21 (×2): 10 [IU] via SUBCUTANEOUS
  Filled 2019-10-20 (×4): qty 0.1

## 2019-10-20 MED ORDER — LIVING WELL WITH DIABETES BOOK: Freq: Once | Status: AC

## 2019-10-20 MED ORDER — GUAIFENESIN-DM 100-10 MG/5ML PO SYRP
5.0000 mL | ORAL_SOLUTION | ORAL | Status: DC | PRN
  Administered 2019-10-20: 5 mL via ORAL
  Filled 2019-10-20: qty 5

## 2019-10-20 NOTE — Progress Notes (Signed)
NAMECheryll Bautista, MRN:  [id number], DOB:  14-Jun-1975, LOS: 1 ADMISSION DATE:  10/18/2019, CONSULTATION DATE:  5/19 REFERRING MD:  [facility], CHIEF COMPLAINT:  Sob    Brief History   1 yobf with allergies as child and new onset "asthma" during difficult delivery 2000 and "never right since despite quit smoking in 2016 much worse since summer 2020 req req pred rx the most recent was 2 weeks PTA with severe resp distress > admit ICU with dx refractory asthma     Past Medical History  RA  Plaquenil dependent  Morbid obesity/ osa   Significant Hospital Events     Consults:  PCCM  Procedures:    Significant Diagnostic Tests:     Micro Data:  COVID 19 swab  5/18 neg  MRSA  5/18 >  Neg   Antimicrobials:    Scheduled Meds: . arformoterol  15 mcg Nebulization BID  . busPIRone  7.5 mg Oral BID  . Chlorhexidine Gluconate Cloth  6 each Topical Daily  . enoxaparin (LOVENOX) injection  0.5 mg/kg Subcutaneous Q24H  . escitalopram  10 mg Oral QHS  . famotidine  20 mg Oral QHS  . hydroxychloroquine  200 mg Oral BID  . hydrOXYzine  10 mg Oral BID  . insulin aspart  0-15 Units Subcutaneous TID WC  . insulin detemir  5 Units Subcutaneous QHS  . ipratropium-albuterol  3 mL Nebulization Q6H  . living well with diabetes book   Does not apply Once  . methylPREDNISolone (SOLU-MEDROL) injection  80 mg Intravenous Q12H  . montelukast  10 mg Oral QHS  . naproxen  500 mg Oral BID  . pantoprazole  40 mg Oral BID AC  . QUEtiapine  25 mg Oral BID   Continuous Infusions: PRN Meds:.acetaminophen **OR** acetaminophen, cyclobenzaprine, guaiFENesin-dextromethorphan, hydrALAZINE, polyethylene glycol, promethazine  Interim history/subjective:    Much improved, less hoarse still clearing throat, c/o active pnds    Objective   Blood pressure (!) 152/85, pulse 69, temperature 97.8 F (36.6 C), temperature source Axillary, resp. rate 17, height 5\' 5"  (1.651 m), weight 132.9 kg, last menstrual  period 09/26/2019, SpO2 99 %.        Intake/Output Summary (Last 24 hours) at 10/20/2019 1130 Last data filed at 10/20/2019 0093 Gross per 24 hour  Intake 50 ml  Output 1000 ml  Net -950 ml   Filed Weights   10/18/19 2023 10/18/19 2356 10/20/19 0600  Weight: 134 kg 135.1 kg 132.9 kg    Examination: Tmax  98.3 Pt alert, approp nad @ 30 degrees on 2lpm with sats 99% No jvd Oropharynx clear,  mucosa nl Neck supple Lungs with psuedowheeze only, clears wit plm   RRR no s3 or or sign murmur Abd obese with nl  excursion  Extr warm with no edema or clubbing noted Neuro  Sensorium intact,  no apparent motor deficits         Assessment & Plan:   1) 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 - onset at loss of baby 2000, worse since quit smoking strongly suggestive of pyschological component but also has allergy backgroud   DDX of  difficult airways management almost all start with A and  include Adherence, Ace Inhibitors, Acid Reflux, Active Sinus Disease, Alpha 1 Antitripsin deficiency, Anxiety masquerading as Airways dz,  ABPA,  Allergy(esp in young), Aspiration (esp in elderly), Adverse effects of meds,  Active smoking or vaping, A bunch of PE's (a  small clot burden can't cause this syndrome unless there is already severe underlying pulm or vascular dz with poor reserve) plus two Bs  = Bronchiectasis and Beta blocker use..and one C= CHF   Adherence is always the initial "prime suspect" and is a multilayered concern that requires a "trust but verify" approach in every patient - starting with knowing how to use medications, especially inhalers, correctly, keeping up with refills and understanding the fundamental difference between maintenance and prns vs those medications only taken for a very short course and then stopped and not refilled.  - will need to return to office with all meds in hand using a trust but verify approach to confirm accurate Medication   Reconciliation The principal here is that until we are certain that the  patients are doing what we've asked, it makes no sense to ask them to do more.   ? Acid (or non-acid) GERD > always difficult to exclude as up to 75% of pts in some series report no assoc GI/ Heartburn symptoms> rec max (24h)  acid suppression and diet restrictions/ reviewed and instructions given in writing.   ? Anxiety/depression  > usually at the bottom of this list of usual suspects but should be much higher on this pt's based on H and P and note already on psychotropics and may interfere with adherence and also interpretation of response or lack thereof to symptom management which can be quite subjective.   Allergy/asthma > continue singulair/ duoneb/solumedrol and stop pulmocort for now as may aggrate UA issues   ? ABPA > f/u as outpt to sort out  ? Active sinus dz  >>>>   Check sinus ct/ add zyrtec         2) RA ? Component of RA bronchiolitis or Cricoarytenoid Arthritis both of which can mimic asthma  >>> will need pfts as outpt to sort thru ddx    3) OSA > says choking when uses it which I suspect is related to VCD >>> rec have someone bring her home cpap to set up as inpt and have RT troubleshoot      >>>>   Try off bipap once on home cpap and add flutter valve    Overall much better but def needs intense outpt f/u and likely ent input re pseudowheeze       Labs   CBC: Recent Labs  Lab 10/18/19 1455 10/20/19 0406  WBC 5.8 14.4*  NEUTROABS 3.9  --   HGB 11.8* 11.4*  HCT 39.0 38.6  MCV 79.8* 80.2  PLT 228 219    Basic Metabolic Panel: Recent Labs  Lab 10/18/19 1455 10/19/19 0748 10/20/19 0406  NA 138 136 138  K 4.0 4.3 5.1  CL 107 107 105  CO2 24 19* 24  GLUCOSE 133* 226* 269*  BUN 8 9 13   CREATININE 0.66 0.60 0.68  CALCIUM 8.6* 8.9 8.7*  MG  --  2.0  --    GFR: Estimated Creatinine Clearance: 122.5 mL/min (by C-G formula based on SCr of 0.68 mg/dL). Recent Labs  Lab  10/18/19 1455 10/20/19 0406  WBC 5.8 14.4*    Liver Function Tests: Recent Labs  Lab 10/18/19 1455  AST 19  ALT 16  ALKPHOS 76  BILITOT 0.4  PROT 7.1  ALBUMIN 3.6   No results for input(s): LIPASE, AMYLASE in the last 168 hours. No results for input(s): AMMONIA in the last 168 hours.  ABG    Component Value Date/Time   PHART  7.336 (L) 10/18/2019 2100   PCO2ART 28.7 (L) 10/18/2019 2100   PO2ART 165 (H) 10/18/2019 2100   HCO3 17.1 (L) 10/18/2019 2100   ACIDBASEDEF 9.8 (H) 10/18/2019 2100   O2SAT 99.1 10/18/2019 2100     Coagulation Profile: No results for input(s): INR, PROTIME in the last 168 hours.  Cardiac Enzymes: No results for input(s): CKTOTAL, CKMB, CKMBINDEX, TROPONINI in the last 168 hours.  HbA1C: Hgb A1c MFr Bld  Date/Time Value Ref Range Status  10/19/2019 07:48 AM 6.6 (H) 4.8 - 5.6 % Final    Comment:    (NOTE) Pre diabetes:          5.7%-6.4% Diabetes:              >6.4% Glycemic control for   <7.0% adults with diabetes     CBG: Recent Labs  Lab 10/19/19 1745 10/19/19 2207 10/20/19 0733 10/20/19 1110  GLUCAP 297* 267* 234* 214*     Sandrea Hughs, MD Pulmonary and Critical Care Medicine [facility] Cell [phone] After 6:00 PM or weekends, use Beeper [phone]  After 7:00 pm call Elink  [phone]

## 2019-10-20 NOTE — Progress Notes (Signed)
Inpatient Diabetes Program Recommendations  AACE/ADA: New Consensus Statement on Inpatient Glycemic Control (2015)  Target Ranges:  Prepandial:   less than 140 mg/dL      Peak postprandial:   less than 180 mg/dL (1-2 hours)      Critically ill patients:  140 - 180 mg/dL   Lab Results  Component Value Date   GLUCAP 234 (H) 10/20/2019   HGBA1C 6.6 (H) 10/19/2019    Review of Glycemic Control Results for MACKINZEE, ROSZAK (MRN [id number]) as of 10/20/2019 09:46  Ref. Range 10/19/2019 17:45 10/19/2019 22:07 10/20/2019 07:33  Glucose-Capillary Latest Ref Range: 70 - 99 mg/dL 751 (H) 982 (H) 429 (H)   Diabetes history: No prior hx Current orders for Inpatient glycemic control: Levemir 5 units qd + Novolog moderate tid  Inpatient Diabetes Program Recommendations:   Noted A1c 6.6 and on steroids. Ordered Living Well with diabetes for patient review.  Thank you, Billy Fischer. Domanick Cuccia, RN, MSN, CDE  Diabetes Coordinator Inpatient Glycemic Control Team Team Pager [phone] (8am-5pm) 10/20/2019 10:12 AM

## 2019-10-20 NOTE — Progress Notes (Signed)
PROGRESS NOTE  Elizabeth Bautista NGE:[id number] DOB: [birth year] DOA: 10/18/2019 PCP: System, Pcp Not In  Brief History:  44 y.o. female with medical history significant for asthma, anemia, seizures, sleep apnea presents with 2 weeks of sob, cough and wheeze.  Pt has been using Albuterol nebulizer at home every 4 hours without relief. Pt went to Urgent Care 10/18/19 and they sent pt to ED.  Patient lives in [place], and has been in [place] visiting for the past 2 months. Is undecided if she is going to move to [place]. She reports admissions in the past for asthma exacerbations in [place]. She has never required intubated.  ED Course: O2 sats 98% on room air temperature 98.2.  WBC 5.8.  Portable chest x-ray without acute abnormality.  COVID-19 test negative.  DuoNebs, hour-long albuterol nebulizer, Ativan 0.5 mg, Solu-Medrol 125 mg, magnesium, given, still with significant wheezing and increased work of breathing hence hospitalist was called for admission for asthma exacerbation.  Assessment/Plan: Asthma exacerbation -Continue experiencing shortness of breath with exertion, heezing and tachypnea. -continue CPAP hs use -Continue duonebs and Brovana -Continue IV solumedrol -personally reviewed CXR--no edema or consolidation -appreciate pulmonary -continue singulair   morbid obesity and sleep apnea -continue BIPAP QHS -Body mass index is 49.56 kg/m. -lifestyle modification  anxiety/Depression -No suicidal ideation or hallucination -Continue Seroquel, BuSpar, Lexapro and as needed hydroxyzine.  Rheumatoid arthritis (HCC) -continue plaquenil  GERD/GI prophylaxis -continue Pepcid.  hyperglycemia/Diabetes Mellitus type 2 -no prior hx of diabetes -positive family hx and obesity -steroids most likely contributing -A1C--6.6 -increase levemir to 10 units      Status is: Inpatient  Remains inpatient appropriate because:IV treatments appropriate due to  intensity of illness or inability to take PO   Dispo: The patient is from: Home              Anticipated d/c is to: Home              Anticipated d/c date is: 1 day              Patient currently is not medically stable to d/c.         Family Communication:   No Family at bedside  Consultants:  pulmonary  Code Status:  FULL  DVT Prophylaxis:   [place] Lovenox   Procedures: As Listed in Progress Note Above  Antibiotics: None       Subjective: Pt states she is sob with mild exertion.  Has dry cough.  Denies hemoptysis.  Denies f/c, n/v/d, cp  Objective: Vitals:   10/20/19 1100 10/20/19 1140 10/20/19 1200 10/20/19 1300  BP: (!) 152/74  (!) 149/53 139/84  Pulse: 67  91 72  Resp: 18  20 13   Temp:  98.1 F (36.7 C)    TempSrc:  Oral    SpO2: 98%  99% 100%  Weight:      Height:        Intake/Output Summary (Last 24 hours) at 10/20/2019 1437 Last data filed at 10/20/2019 10/22/2019 Gross per 24 hour  Intake 50 ml  Output 950 ml  Net -900 ml   Weight change: 6.346 kg Exam:   General:  Pt is alert, follows commands appropriately, not in acute distress  HEENT: No icterus, No thrush, No neck mass, [place]/AT  Cardiovascular: RRR, S1/S2, no rubs, no gallops  Respiratory: bibasilar rales.  Mild bibasilar wheeze  Abdomen: Soft/+BS, non tender, non distended, no guarding  Extremities: trace nonpitting edema, No lymphangitis, No petechiae, No rashes, no synovitis   Data Reviewed: I have personally reviewed following labs and imaging studies Basic Metabolic Panel: Recent Labs  Lab 10/18/19 1455 10/19/19 0748 10/20/19 0406  NA 138 136 138  K 4.0 4.3 5.1  CL 107 107 105  CO2 24 19* 24  GLUCOSE 133* 226* 269*  BUN 8 9 13   CREATININE 0.66 0.60 0.68  CALCIUM 8.6* 8.9 8.7*  MG  --  2.0  --    Liver Function Tests: Recent Labs  Lab 10/18/19 1455  AST 19  ALT 16  ALKPHOS 76  BILITOT 0.4  PROT 7.1  ALBUMIN 3.6   No results for input(s): LIPASE, AMYLASE in  the last 168 hours. No results for input(s): AMMONIA in the last 168 hours. Coagulation Profile: No results for input(s): INR, PROTIME in the last 168 hours. CBC: Recent Labs  Lab 10/18/19 1455 10/20/19 0406  WBC 5.8 14.4*  NEUTROABS 3.9  --   HGB 11.8* 11.4*  HCT 39.0 38.6  MCV 79.8* 80.2  PLT 228 219   Cardiac Enzymes: No results for input(s): CKTOTAL, CKMB, CKMBINDEX, TROPONINI in the last 168 hours. BNP: Invalid input(s): POCBNP CBG: Recent Labs  Lab 10/19/19 1745 10/19/19 2207 10/20/19 0733 10/20/19 1110  GLUCAP 297* 267* 234* 214*   HbA1C: Recent Labs    10/19/19 0748  HGBA1C 6.6*   Urine analysis:    Component Value Date/Time   COLORURINE YELLOW 10/19/2019 [place] 10/19/2019 1549   LABSPEC 1.030 10/19/2019 1549   PHURINE 5.0 10/19/2019 1549   GLUCOSEU 150 (A) 10/19/2019 1549   HGBUR NEGATIVE 10/19/2019 1549   BILIRUBINUR NEGATIVE 10/19/2019 1549   KETONESUR 5 (A) 10/19/2019 1549   PROTEINUR NEGATIVE 10/19/2019 1549   NITRITE NEGATIVE 10/19/2019 1549   LEUKOCYTESUR NEGATIVE 10/19/2019 1549   Sepsis Labs: @LABRCNTIP (procalcitonin:4,lacticidven:4) ) Recent Results (from the past 240 hour(s))  SARS Coronavirus 2 by RT PCR (hospital order, performed in [place] hospital lab) Nasopharyngeal Nasopharyngeal Swab     Status: None   Collection Time: 10/18/19  2:41 PM   Specimen: Nasopharyngeal Swab  Result Value Ref Range Status   SARS Coronavirus 2 NEGATIVE NEGATIVE Final    Comment: (NOTE) SARS-CoV-2 target nucleic acids are NOT DETECTED. The SARS-CoV-2 RNA is generally detectable in upper and lower respiratory specimens during the acute phase of infection. The lowest concentration of SARS-CoV-2 viral copies this assay can detect is 250 copies / mL. A negative result does not preclude SARS-CoV-2 infection and should not be used as the sole basis for treatment or other patient management decisions.  A negative result may occur  with improper specimen collection / handling, submission of specimen other than nasopharyngeal swab, presence of viral mutation(s) within the areas targeted by this assay, and inadequate number of viral copies (<250 copies / mL). A negative result must be combined with clinical observations, patient history, and epidemiological information. Fact Sheet for Patients:   StrictlyIdeas.no Fact Sheet for Healthcare Providers: BankingDealers.co.za This test is not yet approved or cleared  by the [place] FDA and has been authorized for detection and/or diagnosis of SARS-CoV-2 by FDA under an Emergency Use Authorization (EUA).  This EUA will remain in effect (meaning this test can be used) for the duration of the COVID-19 declaration under Section 564(b)(1) of the Act, 21 U.S.C. section 360bbb-3(b)(1), unless the authorization is terminated or revoked sooner. Performed at [facility], [address] [place],  [place] [address]   MRSA PCR Screening     Status: None   Collection Time: 10/18/19 11:17 PM   Specimen: Nasal Mucosa; Nasopharyngeal  Result Value Ref Range Status   MRSA by PCR NEGATIVE NEGATIVE Final    Comment:        The GeneXpert MRSA Assay (FDA approved for NASAL specimens only), is one component of a comprehensive MRSA colonization surveillance program. It is not intended to diagnose MRSA infection nor to guide or monitor treatment for MRSA infections. Performed at [facility], [address], [place], [place] [address]      Scheduled Meds: . arformoterol  15 mcg Nebulization BID  . busPIRone  7.5 mg Oral BID  . Chlorhexidine Gluconate Cloth  6 each Topical Daily  . enoxaparin (LOVENOX) injection  0.5 mg/kg Subcutaneous Q24H  . escitalopram  10 mg Oral QHS  . famotidine  20 mg Oral QHS  . hydroxychloroquine  200 mg Oral BID  . hydrOXYzine  10 mg Oral BID  . insulin aspart  0-15 Units Subcutaneous TID WC  . insulin  detemir  5 Units Subcutaneous QHS  . ipratropium-albuterol  3 mL Nebulization Q6H  . living well with diabetes book   Does not apply Once  . methylPREDNISolone (SOLU-MEDROL) injection  80 mg Intravenous Q12H  . montelukast  10 mg Oral QHS  . naproxen  500 mg Oral BID  . pantoprazole  40 mg Oral BID AC  . QUEtiapine  25 mg Oral BID   Continuous Infusions:  Procedures/Studies: DG Chest Port 1 View  Result Date: 10/18/2019 CLINICAL DATA:  Shortness of breath EXAM: PORTABLE CHEST 1 VIEW COMPARISON:  None. FINDINGS: Lungs are clear. Heart is borderline enlarged with pulmonary vascularity normal. No adenopathy. No bone lesions. IMPRESSION: Borderline cardiac enlargement.  Lungs clear. Electronically Signed   By: Bretta Bang III M.D.   On: 10/18/2019 15:10    Catarina Hartshorn, DO  [facility]  If 7PM-7AM, please contact night-coverage [email or web address] Password [email or web address] 10/20/2019, 2:37 PM   LOS: 1 day

## 2019-10-21 LAB — GLUCOSE, CAPILLARY
Glucose-Capillary: 235 mg/dL — ABNORMAL HIGH (ref 70–99)
Glucose-Capillary: 237 mg/dL — ABNORMAL HIGH (ref 70–99)
Glucose-Capillary: 266 mg/dL — ABNORMAL HIGH (ref 70–99)
Glucose-Capillary: 210 mg/dL — ABNORMAL HIGH (ref 70–99)

## 2019-10-21 LAB — BASIC METABOLIC PANEL
Anion gap: 11 (ref 5–15)
BUN: 16 mg/dL (ref 6–20)
CO2: 22 mmol/L (ref 22–32)
Calcium: 8.5 mg/dL — ABNORMAL LOW (ref 8.9–10.3)
Chloride: 103 mmol/L (ref 98–111)
Creatinine, Ser: 0.69 mg/dL (ref 0.44–1.00)
GFR calc Af Amer: >60 mL/min (ref >60)
GFR calc non Af Amer: >60 mL/min (ref >60)
Glucose, Bld: 294 mg/dL — ABNORMAL HIGH (ref 70–99)
Potassium: 4.4 mmol/L (ref 3.5–5.1)
Sodium: 136 mmol/L (ref 135–145)

## 2019-10-21 LAB — MAGNESIUM: Magnesium: 2.2 mg/dL (ref 1.7–2.4)

## 2019-10-21 MED ORDER — TRAMADOL HCL 50 MG PO TABS: 50.0000 mg | ORAL_TABLET | ORAL | Status: DC | PRN

## 2019-10-21 MED ORDER — DEXTROMETHORPHAN POLISTIREX ER 30 MG/5ML PO SUER
30.0000 mg | Freq: Two times a day (BID) | ORAL | Status: DC
  Administered 2019-10-21 – 2019-10-23 (×5): 30 mg via ORAL
  Filled 2019-10-21 (×5): qty 5

## 2019-10-21 MED ORDER — INSULIN ASPART 100 UNIT/ML ~~LOC~~ SOLN
0.0000 [IU] | Freq: Three times a day (TID) | SUBCUTANEOUS | Status: DC
  Administered 2019-10-21: 11 [IU] via SUBCUTANEOUS
  Administered 2019-10-22: 7 [IU] via SUBCUTANEOUS
  Administered 2019-10-22: 4 [IU] via SUBCUTANEOUS
  Administered 2019-10-22: 7 [IU] via SUBCUTANEOUS
  Administered 2019-10-23: 4 [IU] via SUBCUTANEOUS
  Administered 2019-10-23: 7 [IU] via SUBCUTANEOUS

## 2019-10-21 MED ORDER — INSULIN ASPART 100 UNIT/ML ~~LOC~~ SOLN
0.0000 [IU] | Freq: Every day | SUBCUTANEOUS | Status: DC
  Administered 2019-10-21: 2 [IU] via SUBCUTANEOUS

## 2019-10-21 MED ORDER — METHYLPREDNISOLONE SODIUM SUCC 40 MG IJ SOLR
40.0000 mg | Freq: Two times a day (BID) | INTRAMUSCULAR | Status: DC
  Administered 2019-10-21 – 2019-10-22 (×2): 40 mg via INTRAVENOUS
  Filled 2019-10-21 (×2): qty 1

## 2019-10-21 MED ORDER — IPRATROPIUM-ALBUTEROL 0.5-2.5 (3) MG/3ML IN SOLN
3.0000 mL | Freq: Three times a day (TID) | RESPIRATORY_TRACT | Status: DC
  Administered 2019-10-21 – 2019-10-23 (×6): 3 mL via RESPIRATORY_TRACT
  Filled 2019-10-21 (×6): qty 3

## 2019-10-21 MED ORDER — MOMETASONE FURO-FORMOTEROL FUM 100-5 MCG/ACT IN AERO
2.0000 | INHALATION_SPRAY | Freq: Two times a day (BID) | RESPIRATORY_TRACT | Status: DC
  Administered 2019-10-21 – 2019-10-23 (×4): 2 via RESPIRATORY_TRACT
  Filled 2019-10-21: qty 8.8

## 2019-10-21 NOTE — Progress Notes (Signed)
NAMETambra Bautista, MRN:  [id number], DOB:  1975-07-29, LOS: 2 ADMISSION DATE:  10/18/2019, CONSULTATION DATE:  5/19 REFERRING MD:  Mod[facility]/ [facility], CHIEF COMPLAINT:  Sob    Brief History   43 yobf with allergies as child and new onset "asthma" during difficult delivery 10-12-1998 which resulted in death of her baby and "never right since despite quit smoking in 2014/10/12 much worse since summer 2020 req req pred rx the most recent was 2 weeks PTA with severe resp distress > admit ICU with dx refractory asthma     Past Medical History  RA  Plaquenil dependent  Morbid obesity/ osa   Significant Hospital Events     Consults:  PCCM  Procedures:    Significant Diagnostic Tests:     Micro Data:  COVID 19 swab  5/18 neg  MRSA  5/18 >  Neg   Antimicrobials:    Scheduled Meds: . busPIRone  7.5 mg Oral BID  . Chlorhexidine Gluconate Cloth  6 each Topical Daily  . enoxaparin (LOVENOX) injection  0.5 mg/kg Subcutaneous Q24H  . escitalopram  10 mg Oral QHS  . famotidine  20 mg Oral QHS  . hydroxychloroquine  200 mg Oral BID  . hydrOXYzine  10 mg Oral BID  . insulin aspart  0-20 Units Subcutaneous TID WC  . insulin aspart  0-5 Units Subcutaneous QHS  . insulin detemir  10 Units Subcutaneous QHS  . ipratropium-albuterol  3 mL Nebulization TID  . methylPREDNISolone (SOLU-MEDROL) injection  80 mg Intravenous Q12H  . mometasone-formoterol  2 puff Inhalation BID  . montelukast  10 mg Oral QHS  . naproxen  500 mg Oral BID  . pantoprazole  40 mg Oral BID AC  . QUEtiapine  25 mg Oral BID   Continuous Infusions: PRN Meds:.acetaminophen **OR** acetaminophen, cyclobenzaprine, guaiFENesin-dextromethorphan, hydrALAZINE, polyethylene glycol, promethazine  Interim history/subjective:  Was fine this am on my arrival until I demonstrated to her how to use HFA inhaler then started harsh upper airway cough with pseudowheeze resolved with purse lip maneuver and panting    Objective   Blood pressure  132/74, pulse 70, temperature 98.4 F (36.9 C), temperature source Oral, resp. rate 18, height 5\' 5"  (1.651 m), weight 132.9 kg, last menstrual period 09/26/2019, SpO2 98 %.        Intake/Output Summary (Last 24 hours) at 10/21/2019 1329 Last data filed at 10/21/2019 1100 Gross per 24 hour  Intake 490 ml  Output 1650 ml  Net -1160 ml   Filed Weights   10/18/19 2023 10/18/19 2356 10/20/19 0600  Weight: 134 kg 135.1 kg 132.9 kg    Examination: Tmax  98.8  Pt alert, approp nad @ 30 degrees RA No jvd Oropharynx clear,  mucosa nl Neck supple Lungs clear bilaterally until had coughing fit then prominent pseudowheeze ensued  RRR no s3 or or sign murmur Abd obese with nl  excursion  Extr warm with no edema or clubbing noted Neuro  Sensorium nl ,  no apparent motor deficits       I personally reviewed images and agree with radiology impression as follows:  Sinus  CT 10/20/19 wnl   Assessment & Plan:   1) 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 - onset at loss of baby 1998-10-12, worse since quit smoking strongly suggestive of pyschological component but also has allergy backgroud   DDX of  difficult airways management almost all start with A and  include Adherence,  Ace Inhibitors, Acid Reflux, Active Sinus Disease, Alpha 1 Antitripsin deficiency, Anxiety masquerading as Airways dz,  ABPA,  Allergy(esp in young), Aspiration (esp in elderly), Adverse effects of meds,  Active smoking or vaping, A bunch of PE's (a small clot burden can't cause this syndrome unless there is already severe underlying pulm or vascular dz with poor reserve) plus two Bs  = Bronchiectasis and Beta blocker use..and one C= CHF   Adherence is always the initial "prime suspect" and is a multilayered concern that requires a "trust but verify" approach in every patient - starting with knowing how to use medications, especially inhalers, correctly, keeping up with refills and understanding the  fundamental difference between maintenance and prns vs those medications only taken for a very short course and then stopped and not refilled.  - will need to return to office with all meds in hand using a trust but verify approach to confirm accurate Medication  Reconciliation The principal here is that until we are certain that the  patients are doing what we've asked, it makes no sense to ask them to do more.   ? Acid (or non-acid) GERD > always difficult to exclude as up to 75% of pts in some series report no assoc GI/ Heartburn symptoms> rec max (24h)  acid suppression and diet restrictions/ reviewed and instructions given in writing.   ? Anxiety/depression  > usually at the bottom of this list of usual suspects but should be much higher on this pt's based on H and P and note already on psychotropics and may interfere with adherence and also interpretation of response or lack thereof to symptom management which can be quite subjective.   Allergy/asthma > continue singulair/ duoneb/solumedrol and stop pulmocort for now as may aggrate UA issues  - 10/18/2019  After extensive coaching inhaler device,  effectiveness =    75% so ok to restart dulera 100 2bid via spacer but if cough worse best bet is duoneb qid and prednisone taper with f/u in 2 weeks with all meds in hand   ? ABPA > f/u as outpt to sort out  ? Active sinus dz  > ruled out this admit        2) RA ? Component of RA bronchiolitis or Cricoarytenoid Arthritis both of which can mimic asthma  >>> will need pfts as outpt to sort thru ddx    3) OSA > says choking when uses it which I suspect is related to VCD >>> rec have someone bring her home cpap > did not do yet    >>>>   Again try off bipap once on home cpap and add flutter valve    Discussed with Dr Tat:  rec mobilize/ wean steroids, f/u as outpt will be arranged by my [place]  office to see w/in 2 weeks of discharge     Sandrea Hughs, MD Pulmonary and Critical Care  Medicine [facility] Cell [phone] After 6:00 PM or weekends, use Beeper [phone]  After 7:00 pm call Elink  [phone]      Labs   CBC: Recent Labs  Lab 10/18/19 1455 10/20/19 0406  WBC 5.8 14.4*  NEUTROABS 3.9  --   HGB 11.8* 11.4*  HCT 39.0 38.6  MCV 79.8* 80.2  PLT 228 219    Basic Metabolic Panel: Recent Labs  Lab 10/18/19 1455 10/19/19 0748 10/20/19 0406 10/21/19 0433  NA 138 136 138 136  K 4.0 4.3 5.1 4.4  CL 107 107 105 103  CO2 24 19* 24 22  GLUCOSE 133* 226* 269* 294*  BUN 8 9 13 16   CREATININE 0.66 0.60 0.68 0.69  CALCIUM 8.6* 8.9 8.7* 8.5*  MG  --  2.0  --  2.2   GFR: Estimated Creatinine Clearance: 122.5 mL/min (by C-G formula based on SCr of 0.69 mg/dL). Recent Labs  Lab 10/18/19 1455 10/20/19 0406  WBC 5.8 14.4*    Liver Function Tests: Recent Labs  Lab 10/18/19 1455  AST 19  ALT 16  ALKPHOS 76  BILITOT 0.4  PROT 7.1  ALBUMIN 3.6   No results for input(s): LIPASE, AMYLASE in the last 168 hours. No results for input(s): AMMONIA in the last 168 hours.  ABG    Component Value Date/Time   PHART 7.336 (L) 10/18/2019 2100   PCO2ART 28.7 (L) 10/18/2019 2100   PO2ART 165 (H) 10/18/2019 2100   HCO3 17.1 (L) 10/18/2019 2100   ACIDBASEDEF 9.8 (H) 10/18/2019 2100   O2SAT 99.1 10/18/2019 2100     Coagulation Profile: No results for input(s): INR, PROTIME in the last 168 hours.  Cardiac Enzymes: No results for input(s): CKTOTAL, CKMB, CKMBINDEX, TROPONINI in the last 168 hours.  HbA1C: Hgb A1c MFr Bld  Date/Time Value Ref Range Status  10/19/2019 07:48 AM 6.6 (H) 4.8 - 5.6 % Final    Comment:    (NOTE) Pre diabetes:          5.7%-6.4% Diabetes:              >6.4% Glycemic control for   <7.0% adults with diabetes     CBG: Recent Labs  Lab 10/20/19 1110 10/20/19 1600 10/20/19 2124 10/21/19 0741 10/21/19 1121  GLUCAP 214* 237* 224* 235* 237*

## 2019-10-21 NOTE — Progress Notes (Signed)
Inpatient Diabetes Program Recommendations  AACE/ADA: New Consensus Statement on Inpatient Glycemic Control (2015)  Target Ranges:  Prepandial:   less than 140 mg/dL      Peak postprandial:   less than 180 mg/dL (1-2 hours)      Critically ill patients:  140 - 180 mg/dL   Lab Results  Component Value Date   GLUCAP 237 (H) 10/21/2019   HGBA1C 6.6 (H) 10/19/2019    Review of Glycemic Control Results for Elizabeth Bautista, Elizabeth Bautista (MRN [id number]) as of 10/21/2019 12:46  Ref. Range 10/20/2019 11:10 10/20/2019 16:00 10/20/2019 21:24 10/21/2019 07:41 10/21/2019 11:21  Glucose-Capillary Latest Ref Range: 70 - 99 mg/dL 312 (H) 508 (H) 719 (H) 235 (H) 237 (H)   Diabetes history: No prior hx Current orders for Inpatient glycemic control: Levemir 10 units qd + Novolog moderate tid  Inpatient Diabetes Program Recommendations:    While on steroids, consider: -Increase Novolog to resistant scale tid + hs 0-5 units -Add Novolog 2 units tid meal coverage if eats 50% meals  Thank you, Billy Fischer. Hanks, RN, MSN, CDE  Diabetes Coordinator Inpatient Glycemic Control Team Team Pager [phone] (8am-5pm) 10/21/2019 12:48 PM

## 2019-10-21 NOTE — Progress Notes (Addendum)
PROGRESS NOTE  Elizabeth Bautista ERX:[id number] DOB: 04-03-1976 DOA: 10/18/2019 PCP: System, Pcp Not In   Brief History:  44 y.o.femalewith medical history significant forasthma, anemia, seizures, sleep apnea presents with 2 weeks of sob, cough and wheeze.  Pt has been using Albuterol nebulizer at home every 4 hours without relief. Pt went to Urgent Care 10/18/19 and they sent pt to ED.  Patient lives in [place], and has been in [place] visiting for the past 2 months. Is undecided if she is going to move to [place]. She reports admissions in the past for asthma exacerbations in [place]. She has never required intubated.  ED Course:O2 sats 98% on room air temperature 98.2. WBC 5.8. Portable chest x-ray without acute abnormality. COVID-19 test negative. DuoNebs, hour-long albuterol nebulizer, Ativan 0.5 mg, Solu-Medrol 125 mg, magnesium, given, still with significant wheezing and increased work of breathing hence hospitalist was called for admission for asthma exacerbation.  Assessment/Plan: Asthma exacerbation -Continue experiencing shortness of breath with exertion, heezing and tachypnea. -continue CPAP hs use -Continue duonebs and Brovana -Continue IV solumedrol>>>prednisone -personally reviewed CXR--no edema or consolidation -appreciate pulmonary -continue singulair  -CT maxillofacial--Normal non-contrast Face CT  morbidobesityandsleep apnea -continue BIPAP QHS -Body mass index is 49.56 kg/m. -lifestyle modification  anxiety/Depression -No suicidal ideation or hallucination -Continue Seroquel, BuSpar, Lexapro and as needed hydroxyzine.  Rheumatoid arthritis (HCC) -continue plaquenil  GERD/GI prophylaxis -continue Pepcid.  hyperglycemia/Diabetes Mellitus type 2 -no prior hx of diabetes -positive family hx and obesity -steroids most likely contributing -A1C--6.6 -increased levemir to 10 units      Status is:  Inpatient  Remains inpatient appropriate because:IV treatments appropriate due to intensity of illness or inability to take PO   Dispo: The patient is from: Home  Anticipated d/c is to: Home  Anticipated d/c date is: 1 day  Patient currently is not medically stable to d/c.         Family Communication:   No Family at bedside  Consultants:  pulmonary  Code Status:  FULL  DVT Prophylaxis:   [place] Lovenox   Procedures: As Listed in Progress Note Above  Antibiotics: None    Subjective: Pt is breathing better. She denies cp, n/v/d.  She still has nonproductive cough.  She denies f/c, abd pain.  Still has some sob with exertion.  Objective: Vitals:   10/21/19 0150 10/21/19 0202 10/21/19 0547 10/21/19 0748  BP:  129/74 132/74   Pulse:  76 70   Resp:  18 18   Temp:  98.8 F (37.1 C) 98.4 F (36.9 C)   TempSrc:  Oral Oral   SpO2: 100% 100% 99% 98%  Weight:      Height:        Intake/Output Summary (Last 24 hours) at 10/21/2019 1134 Last data filed at 10/21/2019 0900 Gross per 24 hour  Intake 490 ml  Output 1000 ml  Net -510 ml   Weight change:  Exam:   General:  Pt is alert, follows commands appropriately, not in acute distress  HEENT: No icterus, No thrush, No neck mass, [place]/AT  Cardiovascular: RRR, S1/S2, no rubs, no gallops  Respiratory: bibasilar rales. Minimal basilar wheeze  Abdomen: Soft/+BS, non tender, non distended, no guarding  Extremities: Non pitting LE edema, No lymphangitis, No petechiae, No rashes, no synovitis   Data Reviewed: I have personally reviewed following labs and imaging studies Basic Metabolic Panel: Recent Labs  Lab 10/18/19 1455 10/19/19 0748  10/20/19 0406 10/21/19 0433  NA 138 136 138 136  K 4.0 4.3 5.1 4.4  CL 107 107 105 103  CO2 24 19* 24 22  GLUCOSE 133* 226* 269* 294*  BUN 8 9 13 16   CREATININE 0.66 0.60 0.68 0.69  CALCIUM 8.6* 8.9 8.7* 8.5*  MG  --  2.0   --  2.2   Liver Function Tests: Recent Labs  Lab 10/18/19 1455  AST 19  ALT 16  ALKPHOS 76  BILITOT 0.4  PROT 7.1  ALBUMIN 3.6   No results for input(s): LIPASE, AMYLASE in the last 168 hours. No results for input(s): AMMONIA in the last 168 hours. Coagulation Profile: No results for input(s): INR, PROTIME in the last 168 hours. CBC: Recent Labs  Lab 10/18/19 1455 10/20/19 0406  WBC 5.8 14.4*  NEUTROABS 3.9  --   HGB 11.8* 11.4*  HCT 39.0 38.6  MCV 79.8* 80.2  PLT 228 219   Cardiac Enzymes: No results for input(s): CKTOTAL, CKMB, CKMBINDEX, TROPONINI in the last 168 hours. BNP: Invalid input(s): POCBNP CBG: Recent Labs  Lab 10/20/19 1110 10/20/19 1600 10/20/19 2124 10/21/19 0741 10/21/19 1121  GLUCAP 214* 237* 224* 235* 237*   HbA1C: Recent Labs    10/19/19 0748  HGBA1C 6.6*   Urine analysis:    Component Value Date/Time   COLORURINE YELLOW 10/19/2019 1549   APPEARANCEUR CLEAR 10/19/2019 1549   LABSPEC 1.030 10/19/2019 1549   PHURINE 5.0 10/19/2019 1549   GLUCOSEU 150 (A) 10/19/2019 1549   HGBUR NEGATIVE 10/19/2019 1549   BILIRUBINUR NEGATIVE 10/19/2019 1549   KETONESUR 5 (A) 10/19/2019 1549   PROTEINUR NEGATIVE 10/19/2019 1549   NITRITE NEGATIVE 10/19/2019 1549   LEUKOCYTESUR NEGATIVE 10/19/2019 1549   Sepsis Labs: @LABRCNTIP (procalcitonin:4,lacticidven:4) ) Recent Results (from the past 240 hour(s))  SARS Coronavirus 2 by RT PCR (hospital order, performed in [facility] hospital lab) Nasopharyngeal Nasopharyngeal Swab     Status: None   Collection Time: 10/18/19  2:41 PM   Specimen: Nasopharyngeal Swab  Result Value Ref Range Status   SARS Coronavirus 2 NEGATIVE NEGATIVE Final    Comment: (NOTE) SARS-CoV-2 target nucleic acids are NOT DETECTED. The SARS-CoV-2 RNA is generally detectable in upper and lower respiratory specimens during the acute phase of infection. The lowest concentration of SARS-CoV-2 viral copies this assay can detect is  250 copies / mL. A negative result does not preclude SARS-CoV-2 infection and should not be used as the sole basis for treatment or other patient management decisions.  A negative result may occur with improper specimen collection / handling, submission of specimen other than nasopharyngeal swab, presence of viral mutation(s) within the areas targeted by this assay, and inadequate number of viral copies (<250 copies / mL). A negative result must be combined with clinical observations, patient history, and epidemiological information. Fact Sheet for Patients:   [facility] Fact Sheet for Healthcare Providers: 10/20/19 This test is not yet approved or cleared  by the [employer] FDA and has been authorized for detection and/or diagnosis of SARS-CoV-2 by FDA under an Emergency Use Authorization (EUA).  This EUA will remain in effect (meaning this test can be used) for the duration of the COVID-19 declaration under Section 564(b)(1) of the Act, 21 U.S.C. section 360bbb-3(b)(1), unless the authorization is terminated or revoked sooner. Performed at [facility], [address], [place], [address] [place]   MRSA PCR Screening     Status: None   Collection Time: 10/18/19 11:17 PM  Specimen: Nasal Mucosa; Nasopharyngeal  Result Value Ref Range Status   MRSA by PCR NEGATIVE NEGATIVE Final    Comment:        The GeneXpert MRSA Assay (FDA approved for NASAL specimens only), is one component of a comprehensive MRSA colonization surveillance program. It is not intended to diagnose MRSA infection nor to guide or monitor treatment for MRSA infections. Performed at [facility], [address] [place], [place] [address]      Scheduled Meds: . arformoterol  15 mcg Nebulization BID  . busPIRone  7.5 mg Oral BID  . Chlorhexidine Gluconate Cloth  6 each Topical Daily  . enoxaparin (LOVENOX) injection  0.5 mg/kg Subcutaneous  Q24H  . escitalopram  10 mg Oral QHS  . famotidine  20 mg Oral QHS  . hydroxychloroquine  200 mg Oral BID  . hydrOXYzine  10 mg Oral BID  . insulin aspart  0-15 Units Subcutaneous TID WC  . insulin detemir  10 Units Subcutaneous QHS  . ipratropium-albuterol  3 mL Nebulization TID  . methylPREDNISolone (SOLU-MEDROL) injection  80 mg Intravenous Q12H  . montelukast  10 mg Oral QHS  . naproxen  500 mg Oral BID  . pantoprazole  40 mg Oral BID AC  . QUEtiapine  25 mg Oral BID   Continuous Infusions:  Procedures/Studies: DG Chest Port 1 View  Result Date: 10/18/2019 CLINICAL DATA:  Shortness of breath EXAM: PORTABLE CHEST 1 VIEW COMPARISON:  None. FINDINGS: Lungs are clear. Heart is borderline enlarged with pulmonary vascularity normal. No adenopathy. No bone lesions. IMPRESSION: Borderline cardiac enlargement.  Lungs clear. Electronically Signed   By: Lowella Grip III M.D.   On: 10/18/2019 15:10   CT MAXILLOFACIAL WO CONTRAST  Result Date: 10/20/2019 CLINICAL DATA:  44 year old female with persistent hoarseness and cough. EXAM: CT MAXILLOFACIAL WITHOUT CONTRAST TECHNIQUE: Multidetector CT imaging of the maxillofacial structures was performed. Multiplanar CT image reconstructions were also generated. COMPARISON:  Portable chest 10/18/2019. FINDINGS: Osseous: No acute dental finding, although there is a small dentigerous cyst associated with an impacted left mandible wisdom tooth (series 9, image 67). Mandible intact and normally located. Other facial bones intact. No acute osseous abnormality identified. Negative visible cervical vertebrae. Orbits: Intact orbital walls. Globes and intraorbital soft tissues appears symmetric and normal. Sinuses: Bilateral paranasal sinuses are clear throughout. Negative nasal cavity. Bilateral tympanic cavities and mastoids are well pneumatized. Soft tissues: Negative visible noncontrast thyroid, larynx, pharynx, parapharyngeal spaces, retropharyngeal space,  sublingual space, submandibular spaces, masticator and parotid spaces. No upper cervical lymphadenopathy. Limited intracranial: Negative. IMPRESSION: Normal non-contrast Face CT. Electronically Signed   By: Genevie Ann M.D.   On: 10/20/2019 18:18    Orson Eva, DO  [facility]  If 7PM-7AM, please contact night-coverage [email or web address] Password [facility] 10/21/2019, 11:34 AM   LOS: 2 days

## 2019-10-21 NOTE — Plan of Care (Signed)
  Problem: Education: Goal: Knowledge of General Education information will improve Description Including pain rating scale, medication(s)/side effects and non-pharmacologic comfort measures Outcome: Progressing   Problem: Clinical Measurements: Goal: Will remain free from infection Outcome: Progressing   Problem: Clinical Measurements: Goal: Respiratory complications will improve Outcome: Progressing   Problem: Safety: Goal: Ability to remain free from injury will improve Outcome: Progressing   Problem: Skin Integrity: Goal: Risk for impaired skin integrity will decrease Outcome: Progressing   

## 2019-10-21 NOTE — Plan of Care (Signed)

## 2019-10-21 NOTE — Plan of Care (Signed)
  RD consulted for nutrition education regarding steroid-induced hyperglycemia   Lab Results  Component Value Date   HGBA1C 6.6 (H) 10/19/2019    RD provided "Carbohydrate Counting for People with Diabetes" handout from the Academy of Nutrition and Dietetics. Discussed different food groups and their effects on blood sugar, emphasizing carbohydrate-containing foods. Provided list of carbohydrates and recommended serving sizes of common foods.  Discussed importance of controlled and consistent carbohydrate intake throughout the day. Provided examples of ways to balance meals/snacks and encouraged intake of high-fiber, whole grain complex carbohydrates. Teach back method used.  Expect good compliance.  Body mass index is 48.76 kg/m. Pt meets criteria for morbid obesity based on current BMI.  Current diet order is regular, patient is consuming approximately 75% of meals at this time. Labs and medications reviewed. No further nutrition interventions warranted at this time. RD contact information provided. If additional nutrition issues arise, please re-consult RD.  Lars Masson, RD, LDN Clinical Nutrition After Hours/Weekend Pager # in Amion

## 2019-10-22 ENCOUNTER — Inpatient Hospital Stay (HOSPITAL_COMMUNITY): Payer: PRIVATE HEALTH INSURANCE

## 2019-10-22 LAB — BASIC METABOLIC PANEL
Anion gap: 11 (ref 5–15)
BUN: 16 mg/dL (ref 6–20)
CO2: 24 mmol/L (ref 22–32)
Calcium: 8.3 mg/dL — ABNORMAL LOW (ref 8.9–10.3)
Chloride: 102 mmol/L (ref 98–111)
Creatinine, Ser: 0.65 mg/dL (ref 0.44–1.00)
GFR calc Af Amer: >60 mL/min (ref >60)
GFR calc non Af Amer: >60 mL/min (ref >60)
Glucose, Bld: 191 mg/dL — ABNORMAL HIGH (ref 70–99)
Potassium: 3.6 mmol/L (ref 3.5–5.1)
Sodium: 137 mmol/L (ref 135–145)

## 2019-10-22 LAB — GLUCOSE, CAPILLARY
Glucose-Capillary: 215 mg/dL — ABNORMAL HIGH (ref 70–99)
Glucose-Capillary: 163 mg/dL — ABNORMAL HIGH (ref 70–99)
Glucose-Capillary: 225 mg/dL — ABNORMAL HIGH (ref 70–99)
Glucose-Capillary: 186 mg/dL — ABNORMAL HIGH (ref 70–99)

## 2019-10-22 LAB — CBC
HCT: 39.3 % (ref 36.0–46.0)
Hemoglobin: 11.8 g/dL — ABNORMAL LOW (ref 12.0–15.0)
MCH: 23.7 pg — ABNORMAL LOW (ref 26.0–34.0)
MCHC: 30 g/dL (ref 30.0–36.0)
MCV: 79.1 fL — ABNORMAL LOW (ref 80.0–100.0)
Platelets: 211 10*3/uL (ref 150–400)
RBC: 4.97 MIL/uL (ref 3.87–5.11)
RDW: 16.1 % — ABNORMAL HIGH (ref 11.5–15.5)
WBC: 11.1 10*3/uL — ABNORMAL HIGH (ref 4.0–10.5)
nRBC: 0 % (ref 0.0–0.2)

## 2019-10-22 LAB — TROPONIN I (HIGH SENSITIVITY)
Troponin I (High Sensitivity): 3 ng/L (ref <18)
Troponin I (High Sensitivity): 3 ng/L (ref <18)

## 2019-10-22 LAB — D-DIMER, QUANTITATIVE: D-Dimer, Quant: <0.27 ug/mL-FEU (ref 0.00–0.50)

## 2019-10-22 LAB — MAGNESIUM: Magnesium: 2.1 mg/dL (ref 1.7–2.4)

## 2019-10-22 MED ORDER — LEVALBUTEROL HCL 0.63 MG/3ML IN NEBU
0.6300 mg | INHALATION_SOLUTION | Freq: Once | RESPIRATORY_TRACT | Status: AC
  Administered 2019-10-22: 0.63 mg via RESPIRATORY_TRACT
  Filled 2019-10-22: qty 3

## 2019-10-22 MED ORDER — METHYLPREDNISOLONE SODIUM SUCC 125 MG IJ SOLR
60.0000 mg | Freq: Two times a day (BID) | INTRAMUSCULAR | Status: DC
  Administered 2019-10-22 – 2019-10-23 (×2): 60 mg via INTRAVENOUS
  Filled 2019-10-22 (×2): qty 2

## 2019-10-22 MED ORDER — INSULIN DETEMIR 100 UNIT/ML ~~LOC~~ SOLN
15.0000 [IU] | Freq: Every day | SUBCUTANEOUS | Status: DC
  Administered 2019-10-22: 15 [IU] via SUBCUTANEOUS
  Filled 2019-10-22 (×2): qty 0.15

## 2019-10-22 MED ORDER — MAGNESIUM SULFATE 2 GM/50ML IV SOLN
2.0000 g | Freq: Once | INTRAVENOUS | Status: AC
  Administered 2019-10-22: 2 g via INTRAVENOUS
  Filled 2019-10-22: qty 50

## 2019-10-22 NOTE — Progress Notes (Signed)
PROGRESS NOTE  Shynice Sigel ZOX:[id number] DOB: 03/16/1976 DOA: 10/18/2019 PCP: System, Pcp Not In   Brief History: 44 y.o.femalewith medical history significant forasthma, anemia, seizures, sleep apneapresents with 2 weeks of sob, cough and wheeze.Pt has been using Albuterol nebulizer at home every 4 hours without relief. Pt went to Urgent Care5/18/21and they sent pt to ED. Patient lives in [place], and has been in [place] visiting for the past 2 months. Is undecided if she is going to move to [place]. She reports admissions in the past for asthma exacerbations in [place]. She has never required intubated.  ED Course:O2 sats 98% on room air temperature 98.2. WBC 5.8. Portable chest x-ray without acute abnormality. COVID-19 test negative. DuoNebs, hour-long albuterol nebulizer, Ativan 0.5 mg, Solu-Medrol 125 mg, magnesium, given, still with significant wheezing and increased work of breathing hence hospitalist was called for admission for asthma exacerbation.  Assessment/Plan: Asthma exacerbation -Continue experiencing shortness of breath with exertion, heezing and tachypnea. -continue CPAP hs use -Continue duonebs andBrovana -ContinueIV solumedrol>>>prednisone -personally reviewed CXR--no edema or consolidation -appreciate pulmonary -continue singulair  -CT maxillofacial--Normal non-contrast Face CT -10/22/19--more chest tightness and sob--> -repeat CXR -personally reviewed EKG--sinus, nonspecific TWI -D-dimer--neg -check troponins -give mag  morbidobesityandsleep apnea -continue BIPAP QHS -Body mass index is 49.56 kg/m. -lifestyle modification  anxiety/Depression -No suicidal ideation or hallucination -Continue Seroquel, BuSpar, Lexapro and as needed hydroxyzine.  Rheumatoid arthritis (HCC) -continue plaquenil  GERD/GI prophylaxis -continue Pepcid.  hyperglycemia/Diabetes Mellitus type 2 -no prior hx of diabetes  -positive family hx and obesity -steroids most likely contributing -A1C--6.6 -increased levemir to 15 units -escalate to resistant SSI      Status is: Inpatient  Remains inpatient appropriate because:IV treatments appropriate due to intensity of illness or inability to take PO   Dispo: The patient is from:Home Anticipated d/c is WJ:XBJY Anticipated d/c date is: 1 day Patient currentlyis not medically stable to d/c.         Family Communication:NoFamily at bedside  Consultants:pulmonary  Code Status: FULL  DVT Prophylaxis: [place] Lovenox   Procedures: As Listed in Progress Note Above  Antibiotics: None     Subjective: Pt complains more sob, chest tightness this am.  Denies f/c, n/v/d, abd pain, hemoptysis  Objective: Vitals:   10/22/19 0736 10/22/19 0741 10/22/19 0843 10/22/19 0908  BP:   (!) 153/87   Pulse:   66   Resp:   17   Temp:   97.8 F (36.6 C)   TempSrc:   Oral   SpO2: 99% 99% 98% 97%  Weight:      Height:        Intake/Output Summary (Last 24 hours) at 10/22/2019 0958 Last data filed at 10/21/2019 1730 Gross per 24 hour  Intake 240 ml  Output 650 ml  Net -410 ml   Weight change:  Exam:   General:  Pt is alert, follows commands appropriately, not in acute distress  HEENT: No icterus, No thrush, No neck mass, [place]/AT  Cardiovascular: RRR, S1/S2, no rubs, no gallops  Respiratory: bibasilar rales.  Upper airway wheeze  Abdomen: Soft/+BS, non tender, non distended, no guarding  Extremities: Nonpitting edema, No lymphangitis, No petechiae, No rashes, no synovitis   Data Reviewed: I have personally reviewed following labs and imaging studies Basic Metabolic Panel: Recent Labs  Lab 10/18/19 1455 10/19/19 0748 10/20/19 0406 10/21/19 0433 10/22/19 0858  NA 138 136 138 136  --   K  4.0 4.3 5.1 4.4  --   CL 107 107 105 103  --   CO2 24 19* 24 22  --   GLUCOSE  133* 226* 269* 294*  --   BUN 8 9 13 16   --   CREATININE 0.66 0.60 0.68 0.69  --   CALCIUM 8.6* 8.9 8.7* 8.5*  --   MG  --  2.0  --  2.2 2.1   Liver Function Tests: Recent Labs  Lab 10/18/19 1455  AST 19  ALT 16  ALKPHOS 76  BILITOT 0.4  PROT 7.1  ALBUMIN 3.6   No results for input(s): LIPASE, AMYLASE in the last 168 hours. No results for input(s): AMMONIA in the last 168 hours. Coagulation Profile: No results for input(s): INR, PROTIME in the last 168 hours. CBC: Recent Labs  Lab 10/18/19 1455 10/20/19 0406 10/22/19 0858  WBC 5.8 14.4* 11.1*  NEUTROABS 3.9  --   --   HGB 11.8* 11.4* 11.8*  HCT 39.0 38.6 39.3  MCV 79.8* 80.2 79.1*  PLT 228 219 211   Cardiac Enzymes: No results for input(s): CKTOTAL, CKMB, CKMBINDEX, TROPONINI in the last 168 hours. BNP: Invalid input(s): POCBNP CBG: Recent Labs  Lab 10/21/19 0741 10/21/19 1121 10/21/19 1637 10/21/19 2115 10/22/19 0739  GLUCAP 235* 237* 266* 210* 215*   HbA1C: No results for input(s): HGBA1C in the last 72 hours. Urine analysis:    Component Value Date/Time   COLORURINE YELLOW 10/19/2019 1549   APPEARANCEUR CLEAR 10/19/2019 1549   LABSPEC 1.030 10/19/2019 1549   PHURINE 5.0 10/19/2019 1549   GLUCOSEU 150 (A) 10/19/2019 1549   HGBUR NEGATIVE 10/19/2019 1549   BILIRUBINUR NEGATIVE 10/19/2019 1549   KETONESUR 5 (A) 10/19/2019 1549   PROTEINUR NEGATIVE 10/19/2019 1549   NITRITE NEGATIVE 10/19/2019 1549   LEUKOCYTESUR NEGATIVE 10/19/2019 1549   Sepsis Labs: @LABRCNTIP (procalcitonin:4,lacticidven:4) ) Recent Results (from the past 240 hour(s))  SARS Coronavirus 2 by RT PCR (hospital order, performed in [facility] hospital lab) Nasopharyngeal Nasopharyngeal Swab     Status: None   Collection Time: 10/18/19  2:41 PM   Specimen: Nasopharyngeal Swab  Result Value Ref Range Status   SARS Coronavirus 2 NEGATIVE NEGATIVE Final    Comment: (NOTE) SARS-CoV-2 target nucleic acids are NOT DETECTED. The  SARS-CoV-2 RNA is generally detectable in upper and lower respiratory specimens during the acute phase of infection. The lowest concentration of SARS-CoV-2 viral copies this assay can detect is 250 copies / mL. A negative result does not preclude SARS-CoV-2 infection and should not be used as the sole basis for treatment or other patient management decisions.  A negative result may occur with improper specimen collection / handling, submission of specimen other than nasopharyngeal swab, presence of viral mutation(s) within the areas targeted by this assay, and inadequate number of viral copies (<250 copies / mL). A negative result must be combined with clinical observations, patient history, and epidemiological information. Fact Sheet for Patients:   [facility] Fact Sheet for Healthcare Providers: 10/20/19 This test is not yet approved or cleared  by the [employer] FDA and has been authorized for detection and/or diagnosis of SARS-CoV-2 by FDA under an Emergency Use Authorization (EUA).  This EUA will remain in effect (meaning this test can be used) for the duration of the COVID-19 declaration under Section 564(b)(1) of the Act, 21 U.S.C. section 360bbb-3(b)(1), unless the authorization is terminated or revoked sooner. Performed at [facility], [address], [place], [address] [place]  MRSA PCR Screening     Status: None   Collection Time: 10/18/19 11:17 PM   Specimen: Nasal Mucosa; Nasopharyngeal  Result Value Ref Range Status   MRSA by PCR NEGATIVE NEGATIVE Final    Comment:        The GeneXpert MRSA Assay (FDA approved for NASAL specimens only), is one component of a comprehensive MRSA colonization surveillance program. It is not intended to diagnose MRSA infection nor to guide or monitor treatment for MRSA infections. Performed at [facility], [address] [place], [place] [address]      Scheduled  Meds: . busPIRone  7.5 mg Oral BID  . Chlorhexidine Gluconate Cloth  6 each Topical Daily  . dextromethorphan  30 mg Oral BID  . enoxaparin (LOVENOX) injection  0.5 mg/kg Subcutaneous Q24H  . escitalopram  10 mg Oral QHS  . famotidine  20 mg Oral QHS  . hydroxychloroquine  200 mg Oral BID  . hydrOXYzine  10 mg Oral BID  . insulin aspart  0-20 Units Subcutaneous TID WC  . insulin aspart  0-5 Units Subcutaneous QHS  . insulin detemir  10 Units Subcutaneous QHS  . ipratropium-albuterol  3 mL Nebulization TID  . methylPREDNISolone (SOLU-MEDROL) injection  40 mg Intravenous Q12H  . mometasone-formoterol  2 puff Inhalation BID  . montelukast  10 mg Oral QHS  . naproxen  500 mg Oral BID  . pantoprazole  40 mg Oral BID AC  . QUEtiapine  25 mg Oral BID   Continuous Infusions:  Procedures/Studies: DG Chest Port 1 View  Result Date: 10/18/2019 CLINICAL DATA:  Shortness of breath EXAM: PORTABLE CHEST 1 VIEW COMPARISON:  None. FINDINGS: Lungs are clear. Heart is borderline enlarged with pulmonary vascularity normal. No adenopathy. No bone lesions. IMPRESSION: Borderline cardiac enlargement.  Lungs clear. Electronically Signed   By: Lowella Grip III M.D.   On: 10/18/2019 15:10   CT MAXILLOFACIAL WO CONTRAST  Result Date: 10/20/2019 CLINICAL DATA:  44 year old female with persistent hoarseness and cough. EXAM: CT MAXILLOFACIAL WITHOUT CONTRAST TECHNIQUE: Multidetector CT imaging of the maxillofacial structures was performed. Multiplanar CT image reconstructions were also generated. COMPARISON:  Portable chest 10/18/2019. FINDINGS: Osseous: No acute dental finding, although there is a small dentigerous cyst associated with an impacted left mandible wisdom tooth (series 9, image 67). Mandible intact and normally located. Other facial bones intact. No acute osseous abnormality identified. Negative visible cervical vertebrae. Orbits: Intact orbital walls. Globes and intraorbital soft tissues appears  symmetric and normal. Sinuses: Bilateral paranasal sinuses are clear throughout. Negative nasal cavity. Bilateral tympanic cavities and mastoids are well pneumatized. Soft tissues: Negative visible noncontrast thyroid, larynx, pharynx, parapharyngeal spaces, retropharyngeal space, sublingual space, submandibular spaces, masticator and parotid spaces. No upper cervical lymphadenopathy. Limited intracranial: Negative. IMPRESSION: Normal non-contrast Face CT. Electronically Signed   By: Genevie Ann M.D.   On: 10/20/2019 18:18    Orson Eva, DO  [facility]  If 7PM-7AM, please contact night-coverage [email or web address] Password [email or web address] 10/22/2019, 9:58 AM   LOS: 3 days

## 2019-10-23 DIAGNOSIS — J4521 Mild intermittent asthma with (acute) exacerbation: Secondary | ICD-10-CM

## 2019-10-23 LAB — BASIC METABOLIC PANEL
Anion gap: 9 (ref 5–15)
BUN: 17 mg/dL (ref 6–20)
CO2: 25 mmol/L (ref 22–32)
Calcium: 8.3 mg/dL — ABNORMAL LOW (ref 8.9–10.3)
Chloride: 101 mmol/L (ref 98–111)
Creatinine, Ser: 0.6 mg/dL (ref 0.44–1.00)
GFR calc Af Amer: >60 mL/min (ref >60)
GFR calc non Af Amer: >60 mL/min (ref >60)
Glucose, Bld: 218 mg/dL — ABNORMAL HIGH (ref 70–99)
Potassium: 3.8 mmol/L (ref 3.5–5.1)
Sodium: 135 mmol/L (ref 135–145)

## 2019-10-23 LAB — MAGNESIUM: Magnesium: 2.3 mg/dL (ref 1.7–2.4)

## 2019-10-23 LAB — GLUCOSE, CAPILLARY
Glucose-Capillary: 225 mg/dL — ABNORMAL HIGH (ref 70–99)
Glucose-Capillary: 160 mg/dL — ABNORMAL HIGH (ref 70–99)

## 2019-10-23 MED ORDER — MOMETASONE FURO-FORMOTEROL FUM 100-5 MCG/ACT IN AERO: 2.0000 | INHALATION_SPRAY | Freq: Two times a day (BID) | RESPIRATORY_TRACT | 1 refills | Status: DC

## 2019-10-23 MED ORDER — TRAMADOL HCL 50 MG PO TABS: 50.0000 mg | ORAL_TABLET | ORAL | 0 refills | Status: DC | PRN

## 2019-10-23 MED ORDER — PREDNISONE 20 MG PO TABS: 50.0000 mg | ORAL_TABLET | Freq: Two times a day (BID) | ORAL | Status: DC

## 2019-10-23 MED ORDER — PREDNISONE 10 MG PO TABS: 50.0000 mg | ORAL_TABLET | Freq: Two times a day (BID) | ORAL | 0 refills | Status: DC

## 2019-10-23 MED ORDER — IPRATROPIUM-ALBUTEROL 0.5-2.5 (3) MG/3ML IN SOLN
3.0000 mL | RESPIRATORY_TRACT | Status: DC | PRN
  Administered 2019-10-23: 3 mL via RESPIRATORY_TRACT

## 2019-10-23 MED ORDER — METFORMIN HCL 500 MG PO TABS: 500.0000 mg | ORAL_TABLET | Freq: Two times a day (BID) | ORAL | 0 refills | Status: DC

## 2019-10-23 MED ORDER — DEXTROMETHORPHAN POLISTIREX ER 30 MG/5ML PO SUER: 30.0000 mg | Freq: Two times a day (BID) | ORAL | 0 refills | Status: DC

## 2019-10-23 NOTE — Discharge Summary (Signed)
Physician Discharge Summary  Elizabeth Bautista ZOX:[id number] DOB: 1975-12-17 DOA: 10/18/2019  PCP: System, Pcp Not In  Admit date: 10/18/2019 Discharge date: 10/23/2019  Admitted From: Home Disposition:  Home   Recommendations for Outpatient Follow-up:  1. Follow up with PCP in 1-2 weeks 2. Please obtain BMP/CBC in one week    Discharge Condition: Stable CODE STATUS: FULL Diet recommendation: Heart Healthy / Carb Modified   Brief/Interim Summary: 44 y.o.femalewith medical history significant forasthma, anemia, seizures, sleep apneapresents with 2 weeks of sob, cough and wheeze.Pt has been using Albuterol nebulizer at home every 4 hours without relief. Pt went to Urgent Care5/18/21and they sent pt to ED. Patient lives in [place], and has been in [place] visiting for the past 2 months. Is undecided if she is going to move to [place]. She reports admissions in the past for asthma exacerbations in [place]. She has never required intubated.  ED Course:O2 sats 98% on room air temperature 98.2. WBC 5.8. Portable chest x-ray without acute abnormality. COVID-19 test negative. DuoNebs, hour-long albuterol nebulizer, Ativan 0.5 mg, Solu-Medrol 125 mg, magnesium, given, still with significant wheezing and increased work of breathing hence hospitalist was called for admission for asthma exacerbation.   Discharge Diagnoses:  Asthma exacerbation -Continue experiencing shortness of breath with exertion, heezing and tachypnea. -continue CPAP hs use -Continue duonebs andBrovana -ContinueIV solumedrol>>>prednisone taper -personally reviewed CXR--no edema or consolidation -appreciate pulmonary -continue singulair -CT maxillofacial--Normal non-contrast Face CT -10/22/19--more chest tightness and sob--> -repeat CXR--no consolidation or edema -personally reviewed EKG--sinus, nonspecific TWI -D-dimer--neg -check troponins 2-->3 -given mag -10/23/19--breathing much  improved, near baseline -home with Symbicort (insurance will not pay for Dulera)  morbidobesityandsleep apnea -continue BIPAP QHS -Body mass index is 49.56 kg/m. -lifestyle modification  anxiety/Depression -No suicidal ideation or hallucination -Continue Seroquel, BuSpar, Lexapro and as needed hydroxyzine.  Rheumatoid arthritis ([place]) -continue plaquenil  GERD/GI prophylaxis -continue Pepcid.  hyperglycemia/Diabetes Mellitus type 2 -no prior hx of diabetes -positive family hx and obesity -steroids most likely contributing -A1C--6.6 -increasedlevemir to 15 units -escalate to resistant SSI -d/c home with metformin -f/u PCP to adjust regimen   Discharge Instructions   Allergies as of 10/23/2019      Reactions   Phenytoin Anaphylaxis   Phenylbutazones       Medication List    TAKE these medications   albuterol 108 (90 Base) MCG/ACT inhaler Commonly known as: VENTOLIN HFA Inhale 1-2 puffs into the lungs every 6 (six) hours as needed for wheezing or shortness of breath.   busPIRone 7.5 MG tablet Commonly known as: BUSPAR Take 7.5 mg by mouth 2 (two) times daily.   cyclobenzaprine 10 MG tablet Commonly known as: FLEXERIL Take 10 mg by mouth 3 (three) times daily as needed for muscle spasms.   dextromethorphan 30 MG/5ML liquid Commonly known as: DELSYM Take 5 mLs (30 mg total) by mouth 2 (two) times daily.   escitalopram 10 MG tablet Commonly known as: LEXAPRO Take 10 mg by mouth in the morning and at bedtime.   famotidine 20 MG tablet Commonly known as: PEPCID Take 20 mg by mouth daily.   ferrous sulfate 325 (65 FE) MG tablet Take 325 mg by mouth 2 (two) times daily.   fluticasone 0.005 % ointment Commonly known as: CUTIVATE Apply 1 application topically daily.   hydroxychloroquine 200 MG tablet Commonly known as: PLAQUENIL Take 200 mg by mouth 2 (two) times daily.   hydrOXYzine 10 MG tablet Commonly known as: ATARAX/VISTARIL Take 10 mg  by mouth in the morning and at bedtime.   ipratropium-albuterol 0.5-2.5 (3) MG/3ML Soln Commonly known as: DUONEB Inhale 3 mLs into the lungs every 6 (six) hours as needed (for shortness of breath/wheezing).   metFORMIN 500 MG tablet Commonly known as: Glucophage Take 1 tablet (500 mg total) by mouth 2 (two) times daily with a meal.   mometasone-formoterol 100-5 MCG/ACT Aero Commonly known as: DULERA Inhale 2 puffs into the lungs 2 (two) times daily.   montelukast 10 MG tablet Commonly known as: SINGULAIR Take 10 mg by mouth at bedtime.   naproxen 500 MG tablet Commonly known as: NAPROSYN Take 500 mg by mouth 2 (two) times daily.   predniSONE 10 MG tablet Commonly known as: DELTASONE Take 5 tablets (50 mg total) by mouth 2 (two) times daily with a meal. Then 4 tabs two times daily, then 3 tabs two times daily, then 2 tabs two times daily, then 1 tab two times daily   QUEtiapine 25 MG tablet Commonly known as: SEROQUEL Take 25 mg by mouth 2 (two) times daily.   Spiriva Respimat 2.5 MCG/ACT Aers Generic drug: Tiotropium Bromide Monohydrate Inhale 2 puffs into the lungs daily.   traMADol 50 MG tablet Commonly known as: ULTRAM Take 1 tablet (50 mg total) by mouth every 4 (four) hours as needed (coughing refractory to delsym).   Vitamin D3 10 MCG (400 UNIT) tablet Take 800 Units by mouth daily.       Allergies  Allergen Reactions  . Phenytoin Anaphylaxis  . Phenylbutazones     Consultations:  pulm   Procedures/Studies: DG CHEST PORT 1 VIEW  Result Date: 10/22/2019 CLINICAL DATA:  Dyspnea, asthma EXAM: PORTABLE CHEST 1 VIEW COMPARISON:  10/18/2019 FINDINGS: Single frontal view of the chest demonstrates an unremarkable cardiac silhouette. Linear consolidation left lung base favor atelectasis. No airspace disease, effusion, or pneumothorax. No acute bony abnormalities. IMPRESSION: 1. Hypoventilatory changes left base.  No acute process. Electronically Signed   By:  Sharlet Salina M.D.   On: 10/22/2019 15:10   DG Chest Port 1 View  Result Date: 10/18/2019 CLINICAL DATA:  Shortness of breath EXAM: PORTABLE CHEST 1 VIEW COMPARISON:  None. FINDINGS: Lungs are clear. Heart is borderline enlarged with pulmonary vascularity normal. No adenopathy. No bone lesions. IMPRESSION: Borderline cardiac enlargement.  Lungs clear. Electronically Signed   By: Bretta Bang III M.D.   On: 10/18/2019 15:10   CT MAXILLOFACIAL WO CONTRAST  Result Date: 10/20/2019 CLINICAL DATA:  44 year old female with persistent hoarseness and cough. EXAM: CT MAXILLOFACIAL WITHOUT CONTRAST TECHNIQUE: Multidetector CT imaging of the maxillofacial structures was performed. Multiplanar CT image reconstructions were also generated. COMPARISON:  Portable chest 10/18/2019. FINDINGS: Osseous: No acute dental finding, although there is a small dentigerous cyst associated with an impacted left mandible wisdom tooth (series 9, image 67). Mandible intact and normally located. Other facial bones intact. No acute osseous abnormality identified. Negative visible cervical vertebrae. Orbits: Intact orbital walls. Globes and intraorbital soft tissues appears symmetric and normal. Sinuses: Bilateral paranasal sinuses are clear throughout. Negative nasal cavity. Bilateral tympanic cavities and mastoids are well pneumatized. Soft tissues: Negative visible noncontrast thyroid, larynx, pharynx, parapharyngeal spaces, retropharyngeal space, sublingual space, submandibular spaces, masticator and parotid spaces. No upper cervical lymphadenopathy. Limited intracranial: Negative. IMPRESSION: Normal non-contrast Face CT. Electronically Signed   By: Odessa Fleming M.D.   On: 10/20/2019 18:18         Discharge Exam: Vitals:   10/23/19 0747 10/23/19 0752  BP:  Pulse:    Resp:    Temp:    SpO2: 98% 98%   Vitals:   10/23/19 0315 10/23/19 0505 10/23/19 0747 10/23/19 0752  BP:  (!) 142/92    Pulse:  65    Resp:  16      Temp:  97.9 F (36.6 C)    TempSrc:  Oral    SpO2: 97% 98% 98% 98%  Weight:      Height:        General: Pt is alert, awake, not in acute distress Cardiovascular: RRR, S1/S2 +, no rubs, no gallops Respiratory: bibasilar rales. No wheeze Abdominal: Soft, NT, ND, bowel sounds + Extremities: no edema, no cyanosis   The results of significant diagnostics from this hospitalization (including imaging, microbiology, ancillary and laboratory) are listed below for reference.    Significant Diagnostic Studies: DG CHEST PORT 1 VIEW  Result Date: 10/22/2019 CLINICAL DATA:  Dyspnea, asthma EXAM: PORTABLE CHEST 1 VIEW COMPARISON:  10/18/2019 FINDINGS: Single frontal view of the chest demonstrates an unremarkable cardiac silhouette. Linear consolidation left lung base favor atelectasis. No airspace disease, effusion, or pneumothorax. No acute bony abnormalities. IMPRESSION: 1. Hypoventilatory changes left base.  No acute process. Electronically Signed   By: Sharlet Salina M.D.   On: 10/22/2019 15:10   DG Chest Port 1 View  Result Date: 10/18/2019 CLINICAL DATA:  Shortness of breath EXAM: PORTABLE CHEST 1 VIEW COMPARISON:  None. FINDINGS: Lungs are clear. Heart is borderline enlarged with pulmonary vascularity normal. No adenopathy. No bone lesions. IMPRESSION: Borderline cardiac enlargement.  Lungs clear. Electronically Signed   By: Bretta Bang III M.D.   On: 10/18/2019 15:10   CT MAXILLOFACIAL WO CONTRAST  Result Date: 10/20/2019 CLINICAL DATA:  44 year old female with persistent hoarseness and cough. EXAM: CT MAXILLOFACIAL WITHOUT CONTRAST TECHNIQUE: Multidetector CT imaging of the maxillofacial structures was performed. Multiplanar CT image reconstructions were also generated. COMPARISON:  Portable chest 10/18/2019. FINDINGS: Osseous: No acute dental finding, although there is a small dentigerous cyst associated with an impacted left mandible wisdom tooth (series 9, image 67). Mandible intact  and normally located. Other facial bones intact. No acute osseous abnormality identified. Negative visible cervical vertebrae. Orbits: Intact orbital walls. Globes and intraorbital soft tissues appears symmetric and normal. Sinuses: Bilateral paranasal sinuses are clear throughout. Negative nasal cavity. Bilateral tympanic cavities and mastoids are well pneumatized. Soft tissues: Negative visible noncontrast thyroid, larynx, pharynx, parapharyngeal spaces, retropharyngeal space, sublingual space, submandibular spaces, masticator and parotid spaces. No upper cervical lymphadenopathy. Limited intracranial: Negative. IMPRESSION: Normal non-contrast Face CT. Electronically Signed   By: Odessa Fleming M.D.   On: 10/20/2019 18:18     Microbiology: Recent Results (from the past 240 hour(s))  SARS Coronavirus 2 by RT PCR (hospital order, performed in [facility] hospital lab) Nasopharyngeal Nasopharyngeal Swab     Status: None   Collection Time: 10/18/19  2:41 PM   Specimen: Nasopharyngeal Swab  Result Value Ref Range Status   SARS Coronavirus 2 NEGATIVE NEGATIVE Final    Comment: (NOTE) SARS-CoV-2 target nucleic acids are NOT DETECTED. The SARS-CoV-2 RNA is generally detectable in upper and lower respiratory specimens during the acute phase of infection. The lowest concentration of SARS-CoV-2 viral copies this assay can detect is 250 copies / mL. A negative result does not preclude SARS-CoV-2 infection and should not be used as the sole basis for treatment or other patient management decisions.  A negative result may occur with improper specimen collection / handling, submission of  specimen other than nasopharyngeal swab, presence of viral mutation(s) within the areas targeted by this assay, and inadequate number of viral copies (<250 copies / mL). A negative result must be combined with clinical observations, patient history, and epidemiological information. Fact Sheet for Patients:     BoilerBrush.com.cy Fact Sheet for Healthcare Providers: https://pope.com/ This test is not yet approved or cleared  by the [place] FDA and has been authorized for detection and/or diagnosis of SARS-CoV-2 by FDA under an Emergency Use Authorization (EUA).  This EUA will remain in effect (meaning this test can be used) for the duration of the COVID-19 declaration under Section 564(b)(1) of the Act, 21 U.S.C. section 360bbb-3(b)(1), unless the authorization is terminated or revoked sooner. Performed at [facility], [address], [place], [place] [address]   MRSA PCR Screening     Status: None   Collection Time: 10/18/19 11:17 PM   Specimen: Nasal Mucosa; Nasopharyngeal  Result Value Ref Range Status   MRSA by PCR NEGATIVE NEGATIVE Final    Comment:        The GeneXpert MRSA Assay (FDA approved for NASAL specimens only), is one component of a comprehensive MRSA colonization surveillance program. It is not intended to diagnose MRSA infection nor to guide or monitor treatment for MRSA infections. Performed at [facility], [address], [place], [place] [address]      Labs: Basic Metabolic Panel: Recent Labs  Lab 10/19/19 [phone] 10/19/19 2841 10/20/19 0406 10/20/19 0406 10/21/19 0433 10/21/19 0433 10/22/19 0858 10/23/19 0759  NA 136  --  138  --  136  --  137 135  K 4.3   < > 5.1   < > 4.4   < > 3.6 3.8  CL 107  --  105  --  103  --  102 101  CO2 19*  --  24  --  22  --  24 25  GLUCOSE 226*  --  269*  --  294*  --  191* 218*  BUN 9  --  13  --  16  --  16 17  CREATININE 0.60  --  0.68  --  0.69  --  0.65 0.60  CALCIUM 8.9  --  8.7*  --  8.5*  --  8.3* 8.3*  MG 2.0  --   --   --  2.2  --  2.1 2.3   < > = values in this interval not displayed.   Liver Function Tests: Recent Labs  Lab 10/18/19 1455  AST 19  ALT 16  ALKPHOS 76  BILITOT 0.4  PROT 7.1  ALBUMIN 3.6   No results for input(s): LIPASE, AMYLASE in  the last 168 hours. No results for input(s): AMMONIA in the last 168 hours. CBC: Recent Labs  Lab 10/18/19 1455 10/20/19 0406 10/22/19 0858  WBC 5.8 14.4* 11.1*  NEUTROABS 3.9  --   --   HGB 11.8* 11.4* 11.8*  HCT 39.0 38.6 39.3  MCV 79.8* 80.2 79.1*  PLT 228 219 211   Cardiac Enzymes: No results for input(s): CKTOTAL, CKMB, CKMBINDEX, TROPONINI in the last 168 hours. BNP: Invalid input(s): POCBNP CBG: Recent Labs  Lab 10/22/19 1101 10/22/19 1704 10/22/19 2127 10/23/19 0717 10/23/19 1110  GLUCAP 163* 225* 186* 225* 160*    Time coordinating discharge:  36 minutes  Signed:  Catarina Hartshorn, DO [facility] Pager: [phone] 10/23/2019, 5:38 PM

## 2019-10-23 NOTE — Progress Notes (Signed)
Nsg Discharge Note  Admit Date:  10/18/2019 Discharge date: 10/23/2019   Elizabeth Bautista to be D/C'd Home per MD order.  AVS completed.  Copy for chart, and copy for patient signed, and dated. IV removed and dressing placed Patient/caregiver able to verbalize understanding.  Discharge Medication: Allergies as of 10/23/2019      Reactions   Phenytoin Anaphylaxis   Phenylbutazones       Medication List    TAKE these medications   albuterol 108 (90 Base) MCG/ACT inhaler Commonly known as: VENTOLIN HFA Inhale 1-2 puffs into the lungs every 6 (six) hours as needed for wheezing or shortness of breath.   busPIRone 7.5 MG tablet Commonly known as: BUSPAR Take 7.5 mg by mouth 2 (two) times daily.   cyclobenzaprine 10 MG tablet Commonly known as: FLEXERIL Take 10 mg by mouth 3 (three) times daily as needed for muscle spasms.   dextromethorphan 30 MG/5ML liquid Commonly known as: DELSYM Take 5 mLs (30 mg total) by mouth 2 (two) times daily.   escitalopram 10 MG tablet Commonly known as: LEXAPRO Take 10 mg by mouth in the morning and at bedtime.   famotidine 20 MG tablet Commonly known as: PEPCID Take 20 mg by mouth daily.   ferrous sulfate 325 (65 FE) MG tablet Take 325 mg by mouth 2 (two) times daily.   fluticasone 0.005 % ointment Commonly known as: CUTIVATE Apply 1 application topically daily.   hydroxychloroquine 200 MG tablet Commonly known as: PLAQUENIL Take 200 mg by mouth 2 (two) times daily.   hydrOXYzine 10 MG tablet Commonly known as: ATARAX/VISTARIL Take 10 mg by mouth in the morning and at bedtime.   ipratropium-albuterol 0.5-2.5 (3) MG/3ML Soln Commonly known as: DUONEB Inhale 3 mLs into the lungs every 6 (six) hours as needed (for shortness of breath/wheezing).   metFORMIN 500 MG tablet Commonly known as: Glucophage Take 1 tablet (500 mg total) by mouth 2 (two) times daily with a meal.   mometasone-formoterol 100-5 MCG/ACT Aero Commonly known as:  DULERA Inhale 2 puffs into the lungs 2 (two) times daily.   montelukast 10 MG tablet Commonly known as: SINGULAIR Take 10 mg by mouth at bedtime.   naproxen 500 MG tablet Commonly known as: NAPROSYN Take 500 mg by mouth 2 (two) times daily.   predniSONE 10 MG tablet Commonly known as: DELTASONE Take 5 tablets (50 mg total) by mouth 2 (two) times daily with a meal. Then 4 tabs two times daily, then 3 tabs two times daily, then 2 tabs two times daily, then 1 tab two times daily   QUEtiapine 25 MG tablet Commonly known as: SEROQUEL Take 25 mg by mouth 2 (two) times daily.   Spiriva Respimat 2.5 MCG/ACT Aers Generic drug: Tiotropium Bromide Monohydrate Inhale 2 puffs into the lungs daily.   traMADol 50 MG tablet Commonly known as: ULTRAM Take 1 tablet (50 mg total) by mouth every 4 (four) hours as needed (coughing refractory to delsym).   Vitamin D3 10 MCG (400 UNIT) tablet Take 800 Units by mouth daily.       Discharge Assessment: Vitals:   10/23/19 0747 10/23/19 0752  BP:    Pulse:    Resp:    Temp:    SpO2: 98% 98%   Skin clean, dry and intact without evidence of skin break down, no evidence of skin tears noted. IV catheter discontinued intact. Site without signs and symptoms of complications - no redness or edema noted at insertion site, patient  denies c/o pain - only slight tenderness at site.  Dressing with slight pressure applied.  D/c Instructions-Education: Discharge instructions given to patient/family with verbalized understanding. D/c education completed with patient/family including follow up instructions, medication list, d/c activities limitations if indicated, with other d/c instructions as indicated by MD - patient able to verbalize understanding, all questions fully answered. Patient instructed to return to ED, call 911, or call MD for any changes in condition.  Patient escorted via WC, and D/C home via private auto.  Lonn Georgia, RN 10/23/2019 1:18  PM

## 2019-11-29 ENCOUNTER — Emergency Department (HOSPITAL_COMMUNITY): Payer: Self-pay

## 2019-11-29 ENCOUNTER — Other Ambulatory Visit: Payer: Self-pay

## 2019-11-29 ENCOUNTER — Emergency Department (HOSPITAL_COMMUNITY)
Admission: EM | Admit: 2019-11-29 | Discharge: 2019-11-29 | Disposition: A | Discharge status: Home / Self Care | Payer: Self-pay | Attending: Emergency Medicine | Admitting: Emergency Medicine

## 2019-11-29 ENCOUNTER — Encounter (HOSPITAL_COMMUNITY): Payer: Self-pay | Admitting: Emergency Medicine

## 2019-11-29 DIAGNOSIS — R609 Edema, unspecified: Secondary | ICD-10-CM

## 2019-11-29 DIAGNOSIS — M25562 Pain in left knee: Secondary | ICD-10-CM | POA: Insufficient documentation

## 2019-11-29 DIAGNOSIS — R2243 Localized swelling, mass and lump, lower limb, bilateral: Secondary | ICD-10-CM | POA: Insufficient documentation

## 2019-11-29 DIAGNOSIS — Z87891 Personal history of nicotine dependence: Secondary | ICD-10-CM | POA: Insufficient documentation

## 2019-11-29 DIAGNOSIS — J45901 Unspecified asthma with (acute) exacerbation: Secondary | ICD-10-CM | POA: Insufficient documentation

## 2019-11-29 LAB — COMPREHENSIVE METABOLIC PANEL
ALT: 23 U/L (ref 0–44)
AST: 19 U/L (ref 15–41)
Albumin: 3.7 g/dL (ref 3.5–5.0)
Alkaline Phosphatase: 76 U/L (ref 38–126)
Anion gap: 9 (ref 5–15)
BUN: 8 mg/dL (ref 6–20)
CO2: 25 mmol/L (ref 22–32)
Calcium: 8.7 mg/dL — ABNORMAL LOW (ref 8.9–10.3)
Chloride: 105 mmol/L (ref 98–111)
Creatinine, Ser: 0.61 mg/dL (ref 0.44–1.00)
GFR calc Af Amer: >60 mL/min (ref >60)
GFR calc non Af Amer: >60 mL/min (ref >60)
Glucose, Bld: 102 mg/dL — ABNORMAL HIGH (ref 70–99)
Potassium: 3.7 mmol/L (ref 3.5–5.1)
Sodium: 139 mmol/L (ref 135–145)
Total Bilirubin: 0.4 mg/dL (ref 0.3–1.2)
Total Protein: 6.9 g/dL (ref 6.5–8.1)

## 2019-11-29 LAB — CBC WITH DIFFERENTIAL/PLATELET
Abs Immature Granulocytes: 0.02 10*3/uL (ref 0.00–0.07)
Basophils Absolute: 0 10*3/uL (ref 0.0–0.1)
Basophils Relative: 1 %
Eosinophils Absolute: 0.4 10*3/uL (ref 0.0–0.5)
Eosinophils Relative: 6 %
HCT: 37.4 % (ref 36.0–46.0)
Hemoglobin: 11 g/dL — ABNORMAL LOW (ref 12.0–15.0)
Immature Granulocytes: 0 %
Lymphocytes Relative: 27 %
Lymphs Abs: 1.8 10*3/uL (ref 0.7–4.0)
MCH: 23.5 pg — ABNORMAL LOW (ref 26.0–34.0)
MCHC: 29.4 g/dL — ABNORMAL LOW (ref 30.0–36.0)
MCV: 79.7 fL — ABNORMAL LOW (ref 80.0–100.0)
Monocytes Absolute: 0.5 10*3/uL (ref 0.1–1.0)
Monocytes Relative: 8 %
Neutro Abs: 3.9 10*3/uL (ref 1.7–7.7)
Neutrophils Relative %: 58 %
Platelets: 233 10*3/uL (ref 150–400)
RBC: 4.69 MIL/uL (ref 3.87–5.11)
RDW: 15.9 % — ABNORMAL HIGH (ref 11.5–15.5)
WBC: 6.6 10*3/uL (ref 4.0–10.5)
nRBC: 0 % (ref 0.0–0.2)

## 2019-11-29 LAB — D-DIMER, QUANTITATIVE: D-Dimer, Quant: 0.33 ug/mL-FEU (ref 0.00–0.50)

## 2019-11-29 IMAGING — DX DG KNEE 1-2V*L*
2 series · 2 of 2 positions shown · non-contrast
Comparison: No recent.

CLINICAL DATA: Swelling.

EXAM:
LEFT KNEE - 1-2 VIEW

[knee ap]
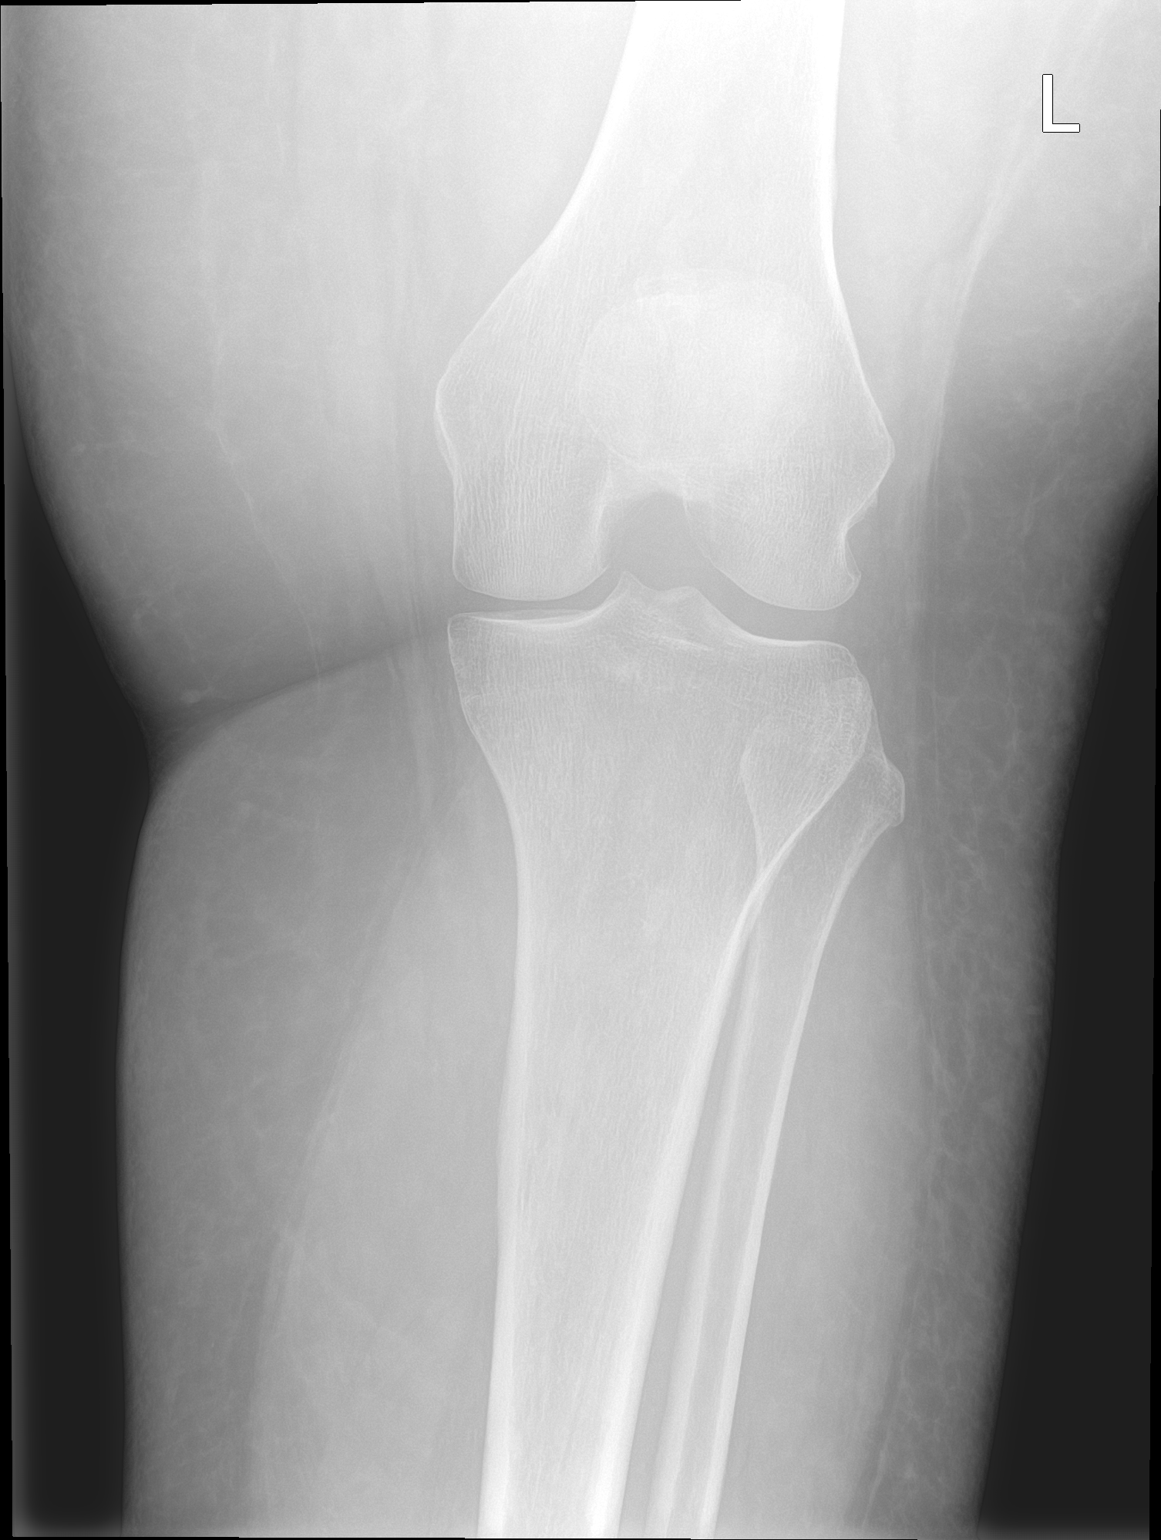

[knee lat]
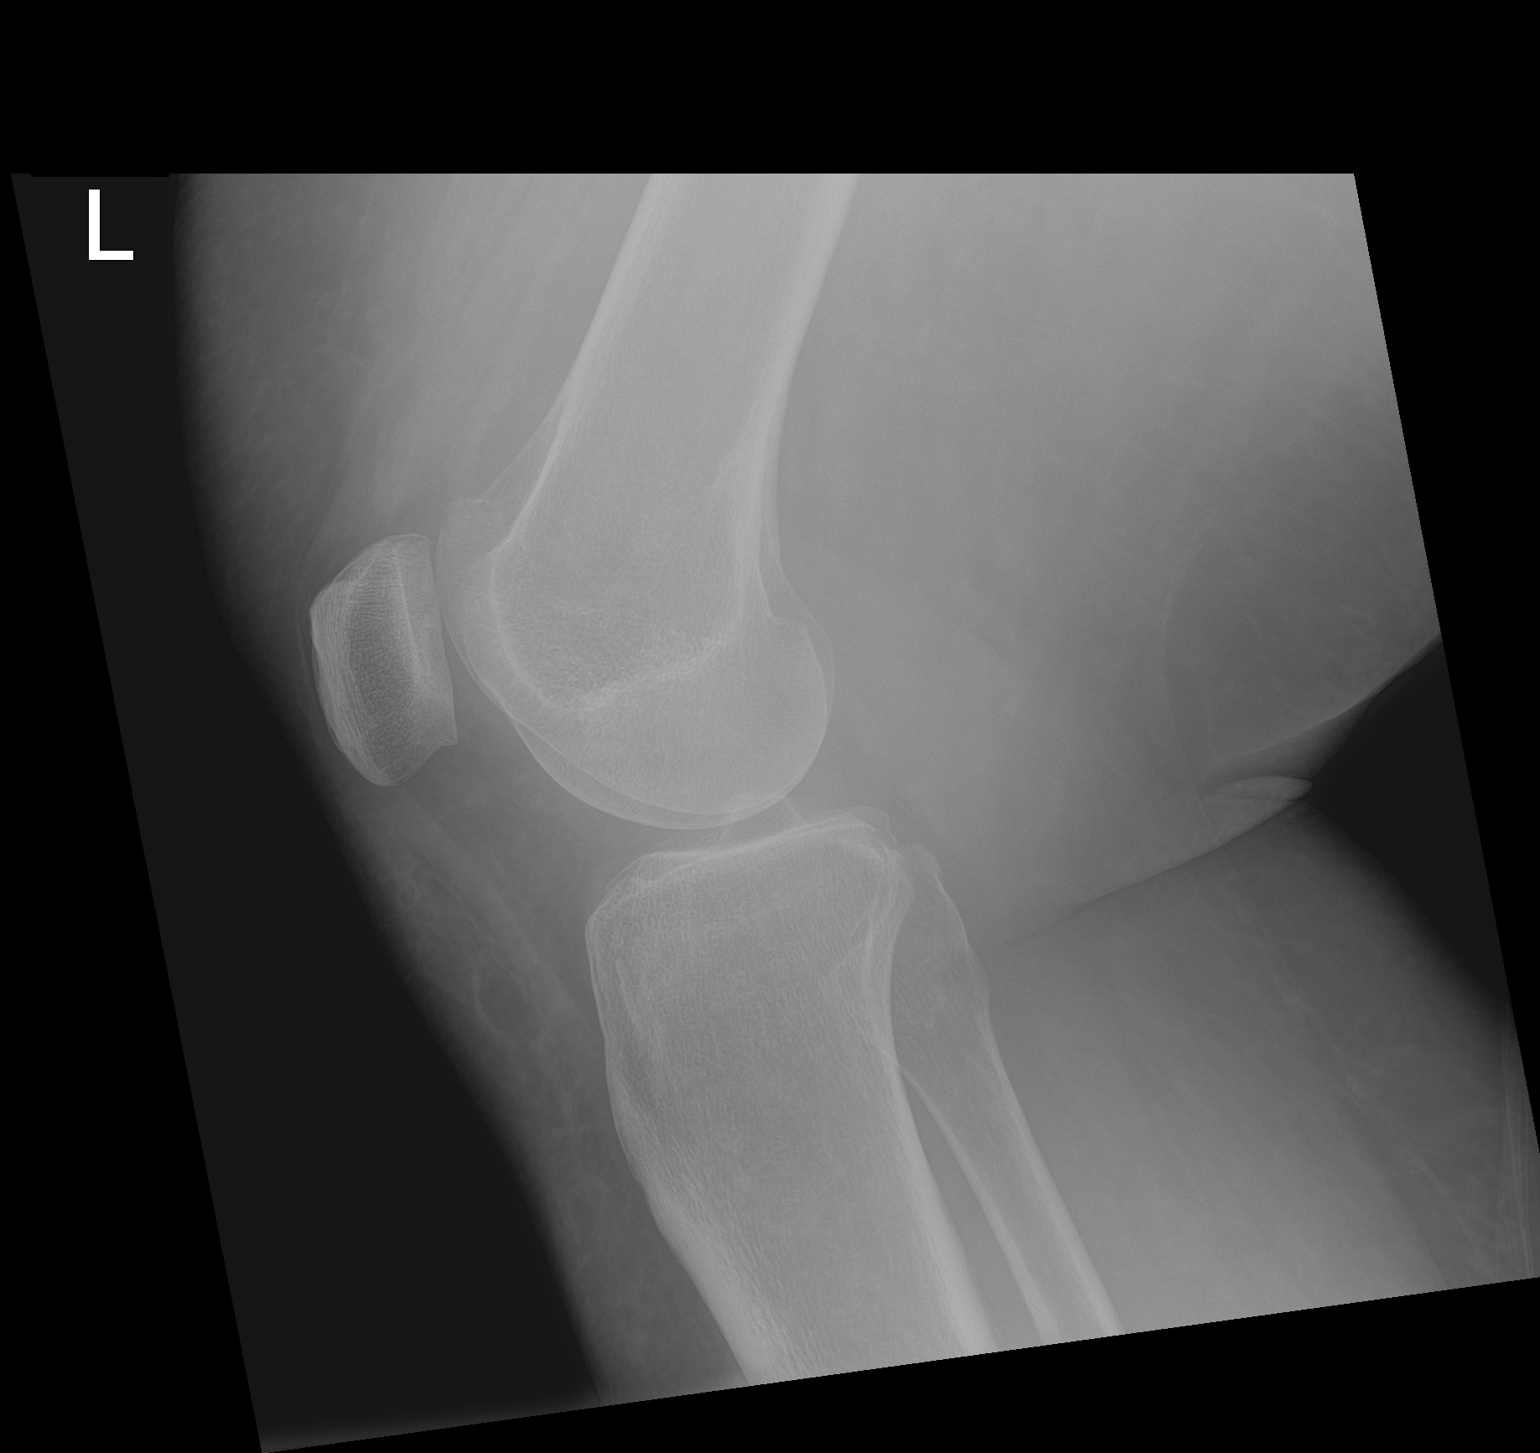

[2 of 2 positions shown; findings below may reference images not displayed]

FINDINGS: No acute bony or joint abnormality identified. No evidence of
fracture or dislocation.
IMPRESSION: No acute abnormality.

## 2019-11-29 MED ORDER — FUROSEMIDE 20 MG PO TABS
ORAL_TABLET | ORAL | 0 refills | Status: DC
Start: 2019-11-29 — End: 2020-01-18

## 2019-11-29 NOTE — TOC Initial Note (Signed)
Transition of Care [facility]) - Initial/Assessment Note   Patient Details  Name: Elizabeth Bautista MRN: [id number] Date of Birth: [birth year]  Transition of Care [facility]) CM/SW Contact:    Ewing Schlein, LCSW Phone Number: 11/29/2019, 12:06 PM  Clinical Narrative: Patient is a 44 year old female who presented to the ED for leg swelling. CSW reviewed patient's chart and patient does not have PCP or insurance listed. CSW spoke with patient regarding PCP and insurance needs. Patient agreeable to referral to [facility] for PCP needs and the financial counselor to assist with Medicaid screening. Patient reported she had Medicaid in [place] and recently moved here so she does not currently have a PCP or Medicaid. CSW called [facility] and spoke with Diane to make referral. CSW made referral to financial counselor to determine Medicaid eligibility.  Expected Discharge Plan: Home/Self Care Barriers to Discharge: Continued Medical Work up  Patient Goals and CMS Choice Choice offered to / list presented to : NA  Expected Discharge Plan and Services Expected Discharge Plan: Home/Self Care In-house Referral: Clinical Social Work, Ship broker Acute Care Choice: NA Living arrangements for the past 2 months: Single Family Home           DME Arranged: N/A DME Agency: NA HH Arranged: NA HH Agency: NA  Prior Living Arrangements/Services Living arrangements for the past 2 months: Single Family Home Lives with:: Minor Children Patient language and need for interpreter reviewed:: Yes Do you feel safe going back to the place where you live?: Yes Need for Family Participation in Patient Care: No (Comment) Care giver support system in place?: No (comment) Criminal Activity/Legal Involvement Pertinent to Current Situation/Hospitalization: No - Comment as needed  Permission Sought/Granted Permission sought to share information with : Other (comment) ([facility]; financial counselor) Permission  granted to share information with : Yes, Verbal Permission Granted Share Information with NAME: Knox Royalty (Artist) Permission granted to share info w AGENCY: [facility]  Emotional Assessment Appearance:: Appears stated age Attitude/Demeanor/Rapport: Engaged Affect (typically observed): Accepting, Appropriate Orientation: : Oriented to Self, Oriented to Place, Oriented to  Time, Oriented to Situation Alcohol / Substance Use: Not Applicable Psych Involvement: No (comment)  Admission diagnosis:  LEG SWELLING Patient Active Problem List   Diagnosis Date Noted  . Obesity, Class III, BMI 40-49.9 (morbid obesity) (HCC) 10/22/2019  . Asthma exacerbation 10/18/2019  . Sleep apnea 10/18/2019  . Depression 10/18/2019  . Rheumatoid arthritis (HCC) 10/18/2019   PCP:  Patient, No Pcp Per Pharmacy:   [id number] - [place] [place] - [address] AT [facility] [address] [place] [address] Phone: [phone] Fax: [phone]  Readmission Risk Interventions No flowsheet data found.

## 2019-11-29 NOTE — ED Triage Notes (Signed)
Pt reports bilateral leg swelling from just above knees down to toes, reports hx but has been more severe x 3 days

## 2019-11-29 NOTE — ED Provider Notes (Signed)
[facility] EMERGENCY DEPARTMENT Provider Note   CSN: [id number] Arrival date & time: 11/29/19  1042     History Chief Complaint  Patient presents with  . Leg Swelling    Elizabeth Bautista is a 44 y.o. female.  Pt complains of swelling to both legs .  Pt has pain in her left knee and more swelling to left knee than  Right.  Pt reports both legs are swollen.  Pt reports she traveled several weeks ago.  Pt denies birth control.    The history is provided by the patient. No language interpreter was used.  Leg Pain Pain details:    Quality:  Aching   Radiates to:  Does not radiate   Severity:  Moderate   Onset quality:  Gradual   Timing:  Constant Relieved by:  Nothing Worsened by:  Nothing Ineffective treatments:  None tried Associated symptoms: no back pain and no fever        Past Medical History:  Diagnosis Date  . Anemia   . Asthma   . Seizures (HCC)    last seizure 2019  . Sleep apnea     Patient Active Problem List   Diagnosis Date Noted  . Obesity, Class III, BMI 40-49.9 (morbid obesity) (HCC) 10/22/2019  . Asthma exacerbation 10/18/2019  . Sleep apnea 10/18/2019  . Depression 10/18/2019  . Rheumatoid arthritis (HCC) 10/18/2019    Past Surgical History:  Procedure Laterality Date  . APPENDECTOMY    . CESAREAN SECTION     x3     OB History   No obstetric history on file.     Family History  Family history unknown: Yes    Social History   Tobacco Use  . Smoking status: Former Smoker    Quit date: 06/02/2014    Years since quitting: 5.4  . Smokeless tobacco: Never Used  Vaping Use  . Vaping Use: Never used  Substance Use Topics  . Alcohol use: Not Currently  . Drug use: Not Currently    Home Medications Prior to Admission medications   Medication Sig Start Date End Date Taking? Authorizing Provider  albuterol (VENTOLIN HFA) 108 (90 Base) MCG/ACT inhaler Inhale 1-2 puffs into the lungs every 6 (six) hours as needed for wheezing or shortness  of breath.  08/26/19   [provider]  busPIRone (BUSPAR) 7.5 MG tablet Take 7.5 mg by mouth 2 (two) times daily. 09/05/19   [provider]  Cholecalciferol (VITAMIN D3) 10 MCG (400 UNIT) tablet Take 800 Units by mouth daily.  05/24/19   [provider]  cyclobenzaprine (FLEXERIL) 10 MG tablet Take 10 mg by mouth 3 (three) times daily as needed for muscle spasms.  08/27/19   [provider]  dextromethorphan (DELSYM) 30 MG/5ML liquid Take 5 mLs (30 mg total) by mouth 2 (two) times daily. 10/23/19   Catarina Hartshorn, MD  escitalopram (LEXAPRO) 10 MG tablet Take 10 mg by mouth in the morning and at bedtime.  09/05/19   [provider]  famotidine (PEPCID) 20 MG tablet Take 20 mg by mouth daily. 07/31/19   [provider]  ferrous sulfate 325 (65 FE) MG tablet Take 325 mg by mouth 2 (two) times daily. 05/17/19   [provider]  fluticasone (CUTIVATE) 0.005 % ointment Apply 1 application topically daily. 10/11/19   [provider]  hydroxychloroquine (PLAQUENIL) 200 MG tablet Take 200 mg by mouth 2 (two) times daily. 07/31/19   [provider]  hydrOXYzine (ATARAX/VISTARIL)  10 MG tablet Take 10 mg by mouth in the morning and at bedtime. 09/05/19   [provider]  ipratropium-albuterol (DUONEB) 0.5-2.5 (3) MG/3ML SOLN Inhale 3 mLs into the lungs every 6 (six) hours as needed (for shortness of breath/wheezing).  07/31/19   [provider]  metFORMIN (GLUCOPHAGE) 500 MG tablet Take 1 tablet (500 mg total) by mouth 2 (two) times daily with a meal. 10/23/19   Tat, Onalee Hua, MD  mometasone-formoterol (DULERA) 100-5 MCG/ACT AERO Inhale 2 puffs into the lungs 2 (two) times daily. 10/23/19   Catarina Hartshorn, MD  montelukast (SINGULAIR) 10 MG tablet Take 10 mg by mouth at bedtime. 04/22/19   [provider]  naproxen (NAPROSYN) 500 MG tablet Take 500 mg by mouth 2 (two) times daily. 09/26/19   [provider]  predniSONE  (DELTASONE) 10 MG tablet Take 5 tablets (50 mg total) by mouth 2 (two) times daily with a meal. Then 4 tabs two times daily, then 3 tabs two times daily, then 2 tabs two times daily, then 1 tab two times daily 10/23/19   Tat, Onalee Hua, MD  QUEtiapine (SEROQUEL) 25 MG tablet Take 25 mg by mouth 2 (two) times daily.  09/05/19   [provider]  SPIRIVA RESPIMAT 2.5 MCG/ACT AERS Inhale 2 puffs into the lungs daily.  10/08/19   [provider]  traMADol (ULTRAM) 50 MG tablet Take 1 tablet (50 mg total) by mouth every 4 (four) hours as needed (coughing refractory to delsym). 10/23/19   Catarina Hartshorn, MD    Allergies    Phenytoin and Phenylbutazones  Review of Systems   Review of Systems  Constitutional: Negative for fever.  Musculoskeletal: Negative for back pain.  All other systems reviewed and are negative.   Physical Exam Updated Vital Signs BP (!) 144/91 (BP Location: Left Wrist)   Pulse 78   Temp 97.9 F (36.6 C) (Oral)   Resp 18   Ht 5\' 5"  (1.651 m)   Wt 127 kg   LMP 11/20/2019   SpO2 99%   BMI 46.59 kg/m   Physical Exam Vitals and nursing note reviewed.  Constitutional:      Appearance: She is well-developed.  HENT:     Head: Normocephalic.     Mouth/Throat:     Mouth: Mucous membranes are moist.  Eyes:     Pupils: Pupils are equal, round, and reactive to light.  Cardiovascular:     Rate and Rhythm: Normal rate and regular rhythm.  Pulmonary:     Effort: Pulmonary effort is normal.  Abdominal:     General: There is no distension.  Musculoskeletal:        General: Swelling present.     Cervical back: Normal range of motion.     Right lower leg: Edema present.     Left lower leg: Edema present.  Skin:    General: Skin is warm.  Neurological:     General: No focal deficit present.     Mental Status: She is alert and oriented to person, place, and time.  Psychiatric:        Mood and Affect: Mood normal.     ED Results / Procedures / Treatments    Labs (all labs ordered are listed, but only abnormal results are displayed) Labs Reviewed  CBC WITH DIFFERENTIAL/PLATELET - Abnormal; Notable for the following components:      Result Value   Hemoglobin 11.0 (*)    MCV 79.7 (*)    MCH 23.5 (*)  MCHC 29.4 (*)    RDW 15.9 (*)    All other components within normal limits  COMPREHENSIVE METABOLIC PANEL - Abnormal; Notable for the following components:   Glucose, Bld 102 (*)    Calcium 8.7 (*)    All other components within normal limits  D-DIMER, QUANTITATIVE (NOT AT [facility])    EKG None  Radiology DG Knee 2 Views Left  Result Date: 11/29/2019 CLINICAL DATA:  Swelling. EXAM: LEFT KNEE - 1-2 VIEW COMPARISON:  No recent. FINDINGS: No acute bony or joint abnormality identified. No evidence of fracture or dislocation. IMPRESSION: No acute abnormality. Electronically Signed   By: Maisie Fus  Register   On: 11/29/2019 13:49    Procedures Procedures (including critical care time)  Medications Ordered in ED Medications - No data to display  ED Course  I have reviewed the triage vital signs and the nursing notes.  Pertinent labs & imaging results that were available during my care of the patient were reviewed by me and considered in my medical decision making (see chart for details).    MDM Rules/Calculators/A&P                          MDM: xray no abnormality,  Negative ddimer. Hemoglobin 11.  I will try pt on lasix.  Pt given referral information.  Pt needs a primary MD  Final Clinical Impression(s) / ED Diagnoses Final diagnoses:  None    Rx / DC Orders ED Discharge Orders         Ordered    furosemide (LASIX) 20 MG tablet     Discontinue  Reprint     11/29/19 1528           Elson Areas, [place] 11/29/19 1554    Terald Sleeper, MD 11/29/19 1919

## 2020-01-18 ENCOUNTER — Telehealth (INDEPENDENT_AMBULATORY_CARE_PROVIDER_SITE_OTHER): Payer: Self-pay | Admitting: Nurse Practitioner

## 2020-01-18 ENCOUNTER — Encounter: Payer: Self-pay | Admitting: Internal Medicine

## 2020-01-18 ENCOUNTER — Other Ambulatory Visit: Payer: Self-pay

## 2020-01-18 ENCOUNTER — Ambulatory Visit (INDEPENDENT_AMBULATORY_CARE_PROVIDER_SITE_OTHER): Payer: Medicaid Other | Admitting: Nurse Practitioner

## 2020-01-18 ENCOUNTER — Encounter (INDEPENDENT_AMBULATORY_CARE_PROVIDER_SITE_OTHER): Payer: Self-pay | Admitting: Nurse Practitioner

## 2020-01-18 VITALS — BP 155/100 | HR 86 | Temp 97.5°F | Ht 65.0 in | Wt 294.4 lb

## 2020-01-18 DIAGNOSIS — M7989 Other specified soft tissue disorders: Secondary | ICD-10-CM | POA: Diagnosis not present

## 2020-01-18 DIAGNOSIS — R030 Elevated blood-pressure reading, without diagnosis of hypertension: Secondary | ICD-10-CM

## 2020-01-18 DIAGNOSIS — J4540 Moderate persistent asthma, uncomplicated: Secondary | ICD-10-CM | POA: Diagnosis not present

## 2020-01-18 DIAGNOSIS — R079 Chest pain, unspecified: Secondary | ICD-10-CM

## 2020-01-18 DIAGNOSIS — R5383 Other fatigue: Secondary | ICD-10-CM

## 2020-01-18 DIAGNOSIS — Z6841 Body Mass Index (BMI) 40.0 and over, adult: Secondary | ICD-10-CM

## 2020-01-18 MED ORDER — SPHYGMOMANOMETER MISC: 1.0000 | Freq: Every day | 0 refills | Status: DC

## 2020-01-18 MED ORDER — HYDROCHLOROTHIAZIDE 12.5 MG PO TABS: 12.5000 mg | ORAL_TABLET | Freq: Every day | ORAL | 0 refills | Status: DC

## 2020-01-18 MED ORDER — ALBUTEROL SULFATE HFA 108 (90 BASE) MCG/ACT IN AERS: 1.0000 | INHALATION_SPRAY | Freq: Four times a day (QID) | RESPIRATORY_TRACT | 3 refills | Status: DC | PRN

## 2020-01-18 NOTE — Progress Notes (Signed)
Subjective:  Patient ID: Elizabeth Bautista, female    DOB: 01/07/1976  Age: 44 y.o. MRN: [id number]  CC:  Chief Complaint  Patient presents with  . Asthma  . Joint Swelling    edema      HPI  This patient arrives today to establish care at this practice.  She is a 44 year old female with past medical history significant for rheumatoid arthritis, anemia, obesity, asthma, edema, seizures, and sleep apnea.  She is new to this area and she is establishing care with primary care provider since moving here.  Her main complaints today are asthma and intermittent bilateral leg swelling.  She tells me she was hospitalized earlier this year for asthma exacerbation.  Per chart review I do see where she was hospitalized back in May of this year for an asthma exacerbation.  She continues on albuterol inhaler and/or DuoNeb that she takes as needed.  She tells me over the last 3 days or so she has been having to use her nebulizer approximately 4 times a day.  In addition she also continues on Dulera, Spiriva, and montelukast daily.  She will also take prednisone tablets as needed if she feels like she is having an asthma exacerbation.  As for the intermittent leg swelling she tells me that over the last year she has had intermittent swelling of her lower extremities which will result in significant/severe pain that will go from her lower extremities up to her upper extremities.  She does report some orthopnea, she does not note any type of pattern associated with the onset of the swelling.  She has been put on furosemide in the past and tells me that this did not seem to improve the swelling.  During view of systems patient did mention that she has been experiencing frequent episodes of sharp chest pain.  She does not feel it is related to her asthma or breathing difficulties.  She tells me she does have a family history of coronary artery disease.  She tells me the other day she felt like she was "having  a heart attack", but eventually the chest pain subsided.  She denies any feelings of anxiety prior to or during the episodes of chest pain.   Past Medical History:  Diagnosis Date  . Anemia   . Asthma   . Edema   . Seizures ([place])    last seizure 2019  . Sleep apnea   . Sleep apnea       Family History  Family history unknown: Yes    Social History   Social History Narrative  . Not on file   Social History   Tobacco Use  . Smoking status: Former Smoker    Quit date: 06/02/2014    Years since quitting: 5.6  . Smokeless tobacco: Never Used  Substance Use Topics  . Alcohol use: Not Currently     Current Meds  Medication Sig  . albuterol (VENTOLIN HFA) 108 (90 Base) MCG/ACT inhaler Inhale 1-2 puffs into the lungs every 6 (six) hours as needed for wheezing or shortness of breath.  . busPIRone (BUSPAR) 7.5 MG tablet Take 7.5 mg by mouth 2 (two) times daily.  . Cholecalciferol (VITAMIN D3) 10 MCG (400 UNIT) tablet Take 800 Units by mouth daily.   . cyclobenzaprine (FLEXERIL) 10 MG tablet Take 10 mg by mouth 3 (three) times daily as needed for muscle spasms.   Marland Kitchen dextromethorphan (DELSYM) 30 MG/5ML liquid Take 5 mLs (30 mg total) by  mouth 2 (two) times daily.  Marland Kitchen escitalopram (LEXAPRO) 10 MG tablet Take 10 mg by mouth in the morning and at bedtime.   . famotidine (PEPCID) 20 MG tablet Take 20 mg by mouth daily.  . ferrous sulfate 325 (65 FE) MG tablet Take 325 mg by mouth 2 (two) times daily.  . fluticasone (CUTIVATE) 0.005 % ointment Apply 1 application topically daily.  . hydroxychloroquine (PLAQUENIL) 200 MG tablet Take 200 mg by mouth 2 (two) times daily.  . hydrOXYzine (ATARAX/VISTARIL) 10 MG tablet Take 10 mg by mouth in the morning and at bedtime.  Marland Kitchen ipratropium-albuterol (DUONEB) 0.5-2.5 (3) MG/3ML SOLN Inhale 3 mLs into the lungs every 6 (six) hours as needed (for shortness of breath/wheezing).   . metFORMIN (GLUCOPHAGE) 500 MG tablet Take 1 tablet (500 mg total) by  mouth 2 (two) times daily with a meal.  . mometasone-formoterol (DULERA) 100-5 MCG/ACT AERO Inhale 2 puffs into the lungs 2 (two) times daily.  . montelukast (SINGULAIR) 10 MG tablet Take 10 mg by mouth at bedtime.  . naproxen (NAPROSYN) 500 MG tablet Take 500 mg by mouth 2 (two) times daily.  . predniSONE (DELTASONE) 10 MG tablet Take 5 tablets (50 mg total) by mouth 2 (two) times daily with a meal. Then 4 tabs two times daily, then 3 tabs two times daily, then 2 tabs two times daily, then 1 tab two times daily  . QUEtiapine (SEROQUEL) 25 MG tablet Take 25 mg by mouth 2 (two) times daily.   Marland Kitchen SPIRIVA RESPIMAT 2.5 MCG/ACT AERS Inhale 2 puffs into the lungs daily.   . traMADol (ULTRAM) 50 MG tablet Take 1 tablet (50 mg total) by mouth every 4 (four) hours as needed (coughing refractory to delsym).  . [DISCONTINUED] albuterol (VENTOLIN HFA) 108 (90 Base) MCG/ACT inhaler Inhale 1-2 puffs into the lungs every 6 (six) hours as needed for wheezing or shortness of breath.   . [DISCONTINUED] furosemide (LASIX) 20 MG tablet One tablet a day as needed for swelling    ROS:  Review of Systems  Constitutional: Positive for malaise/fatigue. Negative for fever and weight loss.  Respiratory: Positive for cough, sputum production, shortness of breath and wheezing.   Cardiovascular: Positive for chest pain and leg swelling.  Neurological: Positive for headaches.     Objective:   Today's Vitals: BP (!) 155/100 (BP Location: Left Arm, Patient Position: Sitting, Cuff Size: Normal)   Pulse 86   Temp (!) 97.5 F (36.4 C) (Temporal)   Ht 5' 5" (1.651 m)   Wt 294 lb 6.4 oz (133.5 kg)   SpO2 95%   BMI 48.99 kg/m  Vitals with BMI 01/18/2020 11/29/2019 11/29/2019  Height 5' 5" - 5' 5"  Weight 294 lbs 6 oz - 280 lbs  BMI 54.49 - 20.10  Systolic 071 219 758  Diastolic 832 87 91  Pulse 86 72 78     Physical Exam Vitals reviewed.  Constitutional:      General: She is not in acute distress.    Appearance:  Normal appearance. She is obese.  HENT:     Head: Normocephalic and atraumatic.  Neck:     Vascular: No carotid bruit.  Cardiovascular:     Rate and Rhythm: Normal rate and regular rhythm.     Pulses: Normal pulses.     Heart sounds: Normal heart sounds.  Pulmonary:     Effort: Pulmonary effort is normal.     Breath sounds: Wheezing present.  Skin:  General: Skin is warm and dry.  Neurological:     General: No focal deficit present.     Mental Status: She is alert and oriented to person, place, and time.  Psychiatric:        Mood and Affect: Mood normal.        Behavior: Behavior normal.        Judgment: Judgment normal.      EKG: Sinus rhythm with inverted T wave in V3    Assessment and Plan   1. Moderate persistent asthma, unspecified whether complicated   2. Elevated BP without diagnosis of hypertension   3. Swelling of lower extremity   4. Chest pain, unspecified type   5. Fatigue, unspecified type   6. Class 3 severe obesity with serious comorbidity and body mass index (BMI) of 45.0 to 49.9 in adult, unspecified obesity type ([place])      Plan: 1.  The patient tells me she is scheduled to follow-up with pulmonologist in the next couple of weeks.  She was encouraged to follow-up as scheduled and I have refilled her albuterol inhaler that she can take as needed. 2.-4.  We will initiate her on hydrochlorothiazide and have her return to the office in a couple of weeks to check blood pressure as well as repeat CMP.  We will collect CMP today as well.  Due to her complaints of orthopnea, chest pain, and risk factors for having coronary artery disease in addition to her abnormal EKG I have discussed this with my supervising physician and we have determined that she should be evaluated by cardiology.  I will set this referral up in addition to ordering a cardiac echocardiogram.  She is requesting a prescription for a cane because she feels this may help her ambulate when she is  experiencing significant pain from the swelling her legs.  Prescription for cane given to patient today.   5., 6.  We will collect blood work for further evaluation today.  Tests ordered Orders Placed This Encounter  Procedures  . For home use only DME Cane  . CMP with eGFR([facility])  . Lipid Panel  . CBC with Differential/Platelets  . Hemoglobin A1c  . TSH  . T3, Free  . T4, Free  . Vitamin D, 25-hydroxy  . Ambulatory referral to Cardiology      Meds ordered this encounter  Medications  . albuterol (VENTOLIN HFA) 108 (90 Base) MCG/ACT inhaler    Sig: Inhale 1-2 puffs into the lungs every 6 (six) hours as needed for wheezing or shortness of breath.    Dispense:  18 g    Refill:  3    Order Specific Question:   Supervising Provider    Answer:   Anastasio Champion, NIMISH C [[id number]]  . hydrochlorothiazide (HYDRODIURIL) 12.5 MG tablet    Sig: Take 1 tablet (12.5 mg total) by mouth daily.    Dispense:  90 tablet    Refill:  0    Order Specific Question:   Supervising Provider    Answer:   Hurshel Party C [[id number]]  . DISCONTD: Blood Pressure Monitoring (SPHYGMOMANOMETER) MISC    Sig: 1 each by Does not apply route daily.    Dispense:  1 each    Refill:  0    Order Specific Question:   Supervising Provider    Answer:   Hurshel Party C [[id number]]  . Blood Pressure Monitoring (SPHYGMOMANOMETER) MISC    Sig: 1 each by Does not apply route daily.  Dispense:  1 each    Refill:  0    Order Specific Question:   Supervising Provider    Answer:   Doree Albee [[id number]]    Patient to follow-up in 1 to 2 weeks for blood pressure check and recheck CMP.  Ailene Ards, NP

## 2020-01-18 NOTE — Telephone Encounter (Signed)
Elizabeth Bautista, I have ordered a cardiac echocardiogram to be performed on this patient.  Please work on getting this scheduled.  Order and information regarding needs for test are a part of office visit dated 01/18/2020.

## 2020-01-18 NOTE — Telephone Encounter (Signed)
Order has been placed.

## 2020-01-18 NOTE — Patient Instructions (Signed)
I have given you a prescription for an at home blood pressure cuff.  Please check your blood pressure daily and keep a log of this, and bring log to next office visit.  [facility] [employer] [facility] is located at [address] [place], [place].  You can go here to get your cane or any other medical equipment supply store that you prefer.

## 2020-01-19 ENCOUNTER — Other Ambulatory Visit (INDEPENDENT_AMBULATORY_CARE_PROVIDER_SITE_OTHER): Payer: Self-pay | Admitting: Nurse Practitioner

## 2020-01-19 DIAGNOSIS — R52 Pain, unspecified: Secondary | ICD-10-CM

## 2020-01-19 LAB — CBC WITH DIFFERENTIAL/PLATELET
Absolute Monocytes: 108 cells/uL — ABNORMAL LOW (ref 200–950)
Basophils Absolute: 23 cells/uL (ref 0–200)
Basophils Relative: 0.3 %
Eosinophils Absolute: 8 cells/uL — ABNORMAL LOW (ref 15–500)
Eosinophils Relative: 0.1 %
HCT: 36.5 % (ref 35.0–45.0)
Hemoglobin: 10.9 g/dL — ABNORMAL LOW (ref 11.7–15.5)
Lymphs Abs: 647 cells/uL — ABNORMAL LOW (ref 850–3900)
MCH: 21.9 pg — ABNORMAL LOW (ref 27.0–33.0)
MCHC: 29.9 g/dL — ABNORMAL LOW (ref 32.0–36.0)
MCV: 73.3 fL — ABNORMAL LOW (ref 80.0–100.0)
MPV: 12.3 fL (ref 7.5–12.5)
Monocytes Relative: 1.4 %
Neutro Abs: 6915 cells/uL (ref 1500–7800)
Neutrophils Relative %: 89.8 %
Platelets: 294 10*3/uL (ref 140–400)
RBC: 4.98 10*6/uL (ref 3.80–5.10)
RDW: 15.8 % — ABNORMAL HIGH (ref 11.0–15.0)
Total Lymphocyte: 8.4 %
WBC: 7.7 10*3/uL (ref 3.8–10.8)

## 2020-01-19 LAB — COMPLETE METABOLIC PANEL WITH GFR
AG Ratio: 1.3 (calc) (ref 1.0–2.5)
ALT: 13 U/L (ref 6–29)
AST: 14 U/L (ref 10–35)
Albumin: 3.9 g/dL (ref 3.6–5.1)
Alkaline phosphatase (APISO): 96 U/L (ref 31–125)
BUN: 8 mg/dL (ref 7–25)
CO2: 24 mmol/L (ref 20–32)
Calcium: 9.3 mg/dL (ref 8.6–10.2)
Chloride: 104 mmol/L (ref 98–110)
Creat: 0.67 mg/dL (ref 0.50–1.10)
GFR, Est African American: 123 mL/min/{1.73_m2} (ref >=60)
GFR, Est Non African American: 106 mL/min/{1.73_m2} (ref >=60)
Globulin: 3.1 g/dL (calc) (ref 1.9–3.7)
Glucose, Bld: 134 mg/dL — ABNORMAL HIGH (ref 65–99)
Potassium: 4.4 mmol/L (ref 3.5–5.3)
Sodium: 137 mmol/L (ref 135–146)
Total Bilirubin: 0.3 mg/dL (ref 0.2–1.2)
Total Protein: 7 g/dL (ref 6.1–8.1)

## 2020-01-19 LAB — LIPID PANEL
Cholesterol: 212 mg/dL — ABNORMAL HIGH (ref <200)
HDL: 82 mg/dL (ref >=50)
LDL Cholesterol (Calc): 111 mg/dL (calc) — ABNORMAL HIGH
Non-HDL Cholesterol (Calc): 130 mg/dL (calc) — ABNORMAL HIGH (ref <130)
Total CHOL/HDL Ratio: 2.6 (calc) (ref <5.0)
Triglycerides: 88 mg/dL (ref <150)

## 2020-01-19 LAB — HEMOGLOBIN A1C
Hgb A1c MFr Bld: 6.9 % of total Hgb — ABNORMAL HIGH (ref <5.7)
Mean Plasma Glucose: 151 (calc)
eAG (mmol/L): 8.4 (calc)

## 2020-01-19 LAB — TSH: TSH: 0.97 mIU/L

## 2020-01-19 LAB — T3, FREE: T3, Free: 2.9 pg/mL (ref 2.3–4.2)

## 2020-01-19 LAB — T4, FREE: Free T4: 1.3 ng/dL (ref 0.8–1.8)

## 2020-01-19 LAB — VITAMIN D 25 HYDROXY (VIT D DEFICIENCY, FRACTURES): Vit D, 25-Hydroxy: 18 ng/mL — ABNORMAL LOW (ref 30–100)

## 2020-01-24 ENCOUNTER — Ambulatory Visit (HOSPITAL_COMMUNITY)
Admission: RE | Admit: 2020-01-24 | Discharge: 2020-01-24 | Disposition: A | Discharge status: Home / Self Care | Payer: Medicaid Other | Source: Ambulatory Visit | Attending: Nurse Practitioner | Admitting: Nurse Practitioner

## 2020-01-24 ENCOUNTER — Other Ambulatory Visit: Payer: Self-pay

## 2020-01-24 DIAGNOSIS — R079 Chest pain, unspecified: Secondary | ICD-10-CM | POA: Diagnosis not present

## 2020-01-24 LAB — ECHOCARDIOGRAM COMPLETE
AR max vel: 1.53 cm2
AV Area VTI: 1.57 cm2
AV Area mean vel: 1.56 cm2
AV Mean grad: 8 mmHg
AV Peak grad: 15.8 mmHg
Ao pk vel: 1.99 m/s
Area-P 1/2: 2.78 cm2
P 1/2 time: 369 msec
S' Lateral: 3.52 cm

## 2020-01-24 NOTE — Progress Notes (Signed)
*  PRELIMINARY RESULTS* Echocardiogram 2D Echocardiogram has been performed.  Stacey Drain 01/24/2020, 9:18 AM

## 2020-01-25 ENCOUNTER — Telehealth (INDEPENDENT_AMBULATORY_CARE_PROVIDER_SITE_OTHER): Payer: Self-pay | Admitting: Internal Medicine

## 2020-01-25 NOTE — Telephone Encounter (Signed)
Tell the patient that the echocardiogram report looks normal to me but Maralyn Sago will discuss this in detail when she sees her for the appointment.

## 2020-01-26 ENCOUNTER — Telehealth (INDEPENDENT_AMBULATORY_CARE_PROVIDER_SITE_OTHER): Payer: Self-pay

## 2020-01-26 NOTE — Telephone Encounter (Signed)
Pt called and was told to contact her office to notify if BP was not coming down. So hse ask if Maralyn Sago would contact her on this matter.

## 2020-01-26 NOTE — Telephone Encounter (Signed)
Pt  WAS GIVEN INSTRUCTIONS TO TAKE 2 TABLETS OF THE HCTZ 12.5 MG DAILY . MAKE SURE SHE KEEPS A LOG OF BLOOD PRESSURES TO COME ON VISIT FOR SARAH NEXT WEEK ON APPOINTMENT. IF SHE IS NOT COMING DOWN IN B/P SHE MAY WANT TO GO TO THE URGENT CARE OVER THE WEEKEND. CALL BACK  OR MYCHART IF SHE HAS ANYMORE CONCERNS.

## 2020-01-26 NOTE — Telephone Encounter (Signed)
Pt keep a log 142/97 is the lowest but highest 159/99 . Today 140/91 having bad headaches. Still is having the nauseous feeling. She has bee taking the whole tablet that she was given; just not working. Please advise

## 2020-01-26 NOTE — Telephone Encounter (Signed)
How high is her blood pressure? If it is >150/>100 then she can increase her hydrochlorothiazide to 25mg  by mouth daily (she was started on 12.5mg  daily at her last office visit). She needs to still come to the office on 02/01/20 for follow-up office visit and blood work as scheduled

## 2020-01-26 NOTE — Telephone Encounter (Signed)
Please call the patient back and tell her to increase hydrochlorothiazide from 12.5 mg daily to 25 mg daily so that she will be taking 2 tablets of the hydrochlorothiazide 12.5 mg daily.  Tell her to keep her appointment with Sarah in the next week or so.

## 2020-01-30 ENCOUNTER — Encounter (INDEPENDENT_AMBULATORY_CARE_PROVIDER_SITE_OTHER): Payer: Self-pay | Admitting: Nurse Practitioner

## 2020-02-01 ENCOUNTER — Encounter (INDEPENDENT_AMBULATORY_CARE_PROVIDER_SITE_OTHER): Payer: Self-pay | Admitting: Nurse Practitioner

## 2020-02-01 ENCOUNTER — Telehealth (INDEPENDENT_AMBULATORY_CARE_PROVIDER_SITE_OTHER): Payer: Self-pay | Admitting: Nurse Practitioner

## 2020-02-01 ENCOUNTER — Other Ambulatory Visit: Payer: Self-pay

## 2020-02-01 ENCOUNTER — Ambulatory Visit (INDEPENDENT_AMBULATORY_CARE_PROVIDER_SITE_OTHER): Payer: Medicaid Other | Admitting: Nurse Practitioner

## 2020-02-01 DIAGNOSIS — I10 Essential (primary) hypertension: Secondary | ICD-10-CM | POA: Insufficient documentation

## 2020-02-01 DIAGNOSIS — Z124 Encounter for screening for malignant neoplasm of cervix: Secondary | ICD-10-CM

## 2020-02-01 DIAGNOSIS — E559 Vitamin D deficiency, unspecified: Secondary | ICD-10-CM

## 2020-02-01 DIAGNOSIS — M069 Rheumatoid arthritis, unspecified: Secondary | ICD-10-CM

## 2020-02-01 DIAGNOSIS — M7989 Other specified soft tissue disorders: Secondary | ICD-10-CM | POA: Diagnosis not present

## 2020-02-01 DIAGNOSIS — Z6841 Body Mass Index (BMI) 40.0 and over, adult: Secondary | ICD-10-CM

## 2020-02-01 DIAGNOSIS — E1165 Type 2 diabetes mellitus with hyperglycemia: Secondary | ICD-10-CM | POA: Insufficient documentation

## 2020-02-01 DIAGNOSIS — Z113 Encounter for screening for infections with a predominantly sexual mode of transmission: Secondary | ICD-10-CM

## 2020-02-01 DIAGNOSIS — D649 Anemia, unspecified: Secondary | ICD-10-CM

## 2020-02-01 DIAGNOSIS — R079 Chest pain, unspecified: Secondary | ICD-10-CM

## 2020-02-01 MED ORDER — HYDROXYCHLOROQUINE SULFATE 200 MG PO TABS: 200.0000 mg | ORAL_TABLET | Freq: Two times a day (BID) | ORAL | 1 refills | Status: DC

## 2020-02-01 MED ORDER — BLOOD GLUCOSE METER KIT: PACK | 0 refills | Status: AC

## 2020-02-01 MED ORDER — CHOLECALCIFEROL 1.25 MG (50000 UT) PO TABS: 1.0000 | ORAL_TABLET | Freq: Every day | ORAL | 0 refills | Status: DC

## 2020-02-01 MED ORDER — HYDROCHLOROTHIAZIDE 25 MG PO TABS: 25.0000 mg | ORAL_TABLET | Freq: Every day | ORAL | 0 refills | Status: DC

## 2020-02-01 MED ORDER — METFORMIN HCL 500 MG PO TABS: 500.0000 mg | ORAL_TABLET | Freq: Two times a day (BID) | ORAL | 0 refills | Status: DC

## 2020-02-01 NOTE — Patient Instructions (Addendum)
Look for vitamin D3 over the counter. Try to find 5,000IUs per tablet and take 1 tablet by mouth daily.  Goal fasting blood sugar is 80-130   Mediterranean Diet A Mediterranean diet refers to food and lifestyle choices that are based on the traditions of countries located on the [employer] [place]. This way of eating has been shown to help prevent certain conditions and improve outcomes for people who have chronic diseases, like kidney disease and heart disease. What are tips for following this plan? Lifestyle  Cook and eat meals together with your family, when possible.  Drink enough fluid to keep your urine clear or pale yellow.  Be physically active every day. This includes: ? Aerobic exercise like running or swimming. ? Leisure activities like gardening, walking, or housework.  Get 7-8 hours of sleep each night.  If recommended by your health care provider, drink red wine in moderation. This means 1 glass a day for nonpregnant women and 2 glasses a day for men. A glass of wine equals 5 oz (150 mL). Reading food labels   Check the serving size of packaged foods. For foods such as rice and pasta, the serving size refers to the amount of cooked product, not dry.  Check the total fat in packaged foods. Avoid foods that have saturated fat or trans fats.  Check the ingredients list for added sugars, such as corn syrup. Shopping  At the grocery store, buy most of your food from the areas near the walls of the store. This includes: ? Fresh fruits and vegetables (produce). ? Grains, beans, nuts, and seeds. Some of these may be available in unpackaged forms or large amounts (in bulk). ? Fresh seafood. ? Poultry and eggs. ? Low-fat dairy products.  Buy whole ingredients instead of prepackaged foods.  Buy fresh fruits and vegetables in-season from local farmers markets.  Buy frozen fruits and vegetables in resealable bags.  If you do not have access to quality fresh seafood, buy  precooked frozen shrimp or canned fish, such as tuna, salmon, or sardines.  Buy small amounts of raw or cooked vegetables, salads, or olives from the deli or salad bar at your store.  Stock your pantry so you always have certain foods on hand, such as olive oil, canned tuna, canned tomatoes, rice, pasta, and beans. Cooking  Cook foods with extra-virgin olive oil instead of using butter or other vegetable oils.  Have meat as a side dish, and have vegetables or grains as your main dish. This means having meat in small portions or adding small amounts of meat to foods like pasta or stew.  Use beans or vegetables instead of meat in common dishes like chili or lasagna.  Experiment with different cooking methods. Try roasting or broiling vegetables instead of steaming or sauteing them.  Add frozen vegetables to soups, stews, pasta, or rice.  Add nuts or seeds for added healthy fat at each meal. You can add these to yogurt, salads, or vegetable dishes.  Marinate fish or vegetables using olive oil, lemon juice, garlic, and fresh herbs. Meal planning   Plan to eat 1 vegetarian meal one day each week. Try to work up to 2 vegetarian meals, if possible.  Eat seafood 2 or more times a week.  Have healthy snacks readily available, such as: ? Vegetable sticks with hummus. ? [place] yogurt. ? Fruit and nut trail mix.  Eat balanced meals throughout the week. This includes: ? Fruit: 2-3 servings a day ? Vegetables: 4-5 servings  a day ? Low-fat dairy: 2 servings a day ? Fish, poultry, or lean meat: 1 serving a day ? Beans and legumes: 2 or more servings a week ? Nuts and seeds: 1-2 servings a day ? Whole grains: 6-8 servings a day ? Extra-virgin olive oil: 3-4 servings a day  Limit red meat and sweets to only a few servings a month What are my food choices?  Mediterranean diet ? Recommended  Grains: Whole-grain pasta. Brown rice. Bulgar wheat. Polenta. Couscous. Whole-wheat bread.  [place].  Vegetables: Artichokes. Beets. Broccoli. Cabbage. Carrots. Eggplant. Green beans. Chard. Kale. Spinach. Onions. Leeks. Peas. Squash. Tomatoes. Peppers. Radishes.  Fruits: Apples. Apricots. Avocado. Berries. Bananas. Cherries. Dates. Figs. Grapes. Lemons. Melon. Oranges. Peaches. Plums. Pomegranate.  Meats and other protein foods: Beans. Almonds. Sunflower seeds. Pine nuts. Peanuts. [place]. Salmon. Scallops. Shrimp. [place]. Tilapia. Clams. Oysters. Eggs.  Dairy: Low-fat milk. Cheese. Greek yogurt.  Beverages: Water. Red wine. Herbal tea.  Fats and oils: Extra virgin olive oil. Avocado oil. Grape seed oil.  Sweets and desserts: [place] yogurt with honey. Baked apples. Poached pears. Trail mix.  Seasoning and other foods: Basil. Cilantro. Coriander. Cumin. Mint. Parsley. Sage. Rosemary. Tarragon. Garlic. Oregano. Thyme. Pepper. Balsalmic vinegar. Tahini. Hummus. Tomato sauce. Olives. Mushrooms. ? Limit these  Grains: Prepackaged pasta or rice dishes. Prepackaged cereal with added sugar.  Vegetables: Deep fried potatoes (french fries).  Fruits: Fruit canned in syrup.  Meats and other protein foods: Beef. Pork. Lamb. Poultry with skin. Hot dogs. Bernie[place] Salines.  Dairy: Ice cream. Sour cream. Whole milk.  Beverages: Juice. Sugar-sweetened soft drinks. Beer. Liquor and spirits.  Fats and oils: Butter. Canola oil. Vegetable oil. Beef fat (tallow). Lard.  Sweets and desserts: Cookies. Cakes. Pies. Candy.  Seasoning and other foods: Mayonnaise. Premade sauces and marinades. The items listed may not be a complete list. Talk with your dietitian about what dietary choices are right for you. Summary  The Mediterranean diet includes both food and lifestyle choices.  Eat a variety of fresh fruits and vegetables, beans, nuts, seeds, and whole grains.  Limit the amount of red meat and sweets that you eat.  Talk with your health care provider about whether it is safe for you to drink red  wine in moderation. This means 1 glass a day for nonpregnant women and 2 glasses a day for men. A glass of wine equals 5 oz (150 mL). This information is not intended to replace advice given to you by your health care provider. Make sure you discuss any questions you have with your health care provider. Document Revised: 01/17/2016 Document Reviewed: 01/10/2016 Elsevier Patient Education  [place].

## 2020-02-01 NOTE — Telephone Encounter (Signed)
Elizabeth Bautista, please look at patient's referral to cardiology and give me an update with this.  I do not see where she has been scheduled and she was asking about this referral today.  Thank you.

## 2020-02-01 NOTE — Progress Notes (Signed)
Subjective:  Patient ID: Elizabeth Bautista, female    DOB: [birth year]  Age: 44 y.o. MRN: [id number]  CC:  Chief Complaint  Patient presents with  . Hypertension  . Other    Edema, vitamin D deficiency, anemia, rheumatoid arthritis, type 2 diabetes      HPI  This patient comes in today for follow-up of the above.  Hypertension: At her last office visit we initiated her on hydrochlorothiazide 12.5 mg daily.  She was checking her blood pressure at home and noted that her blood pressure remained elevated on this dose that she increase it to 25 mg daily.  She tells me this dose seems to be controlling her blood pressure much better.  She is tolerating medication pretty well.  She is due for CMP rechecked today.  Edema/rheumatoid arthritis: She continues to have bilateral lower extremity edema which is painful for her.  She also has a history of rheumatoid arthritis and tells me she is nearly out of her Plaquenil.  She would like a refill on this if possible and referral to rheumatologist.  She has not had an eye exam within the last year, but is willing to undergo this if she can be referred.  Vitamin D deficiency: Blood work from last visit which was approximately 2 weeks ago did show that her vitamin D level was 18.  Anemia: Blood work from last visit in 2 weeks also showed anemia.  She tells me that she continues on her iron supplement and has had significant iron deficiency anemia in the past.  She tells me that she was on her menstrual cycle around the time her blood work was drawn approximately 2 weeks ago.  Type 2 diabetes: Blood work also showed that her A1c is 6.9 which is in the diabetic range.  She has had a history of prediabetes, but tells me that she is not been diagnosis of type II diabetic before.  She was placed on Metformin in the past but tells me that this prescription ran out and she was not given any refills, so she is now taking it currently.  She tells me she did tolerate  it well in the past.  She tells me she has a glucometer but needs a refill on her strips and lancets.  Chest pain: 2 weeks ago at previous office visit patient did ask breast that she was experiencing intermittent episodes of chest pain.  EKG did show normal sinus rhythm with inverted T wave in lead V3.  At that time she was referred to cardiology and she is still waiting to schedule this appointment.  We also ordered cardiac echocardiogram which did show dilated left atrium with normal ejection fraction.  She tells me today that she still is experiencing intermittent episodes of sharp chest pain.  She would also like a referral to OB/GYN for routine well woman exam and be screened for sexual transmitted infections.  Past Medical History:  Diagnosis Date  . Anemia   . Asthma   . Edema   . HTN (hypertension)   . Seizures ([place])    last seizure 2019  . Sleep apnea   . Sleep apnea       Family History  Family history unknown: Yes    Social History   Social History Narrative  . Not on file   Social History   Tobacco Use  . Smoking status: Former Smoker    Quit date: 06/02/2014    Years since quitting: 5.6  .  Smokeless tobacco: Never Used  Substance Use Topics  . Alcohol use: Not Currently     Current Meds  Medication Sig  . albuterol (VENTOLIN HFA) 108 (90 Base) MCG/ACT inhaler Inhale 1-2 puffs into the lungs every 6 (six) hours as needed for wheezing or shortness of breath.  . Blood Pressure Monitoring (SPHYGMOMANOMETER) MISC 1 each by Does not apply route daily.  . busPIRone (BUSPAR) 7.5 MG tablet Take 7.5 mg by mouth 2 (two) times daily.  . cyclobenzaprine (FLEXERIL) 10 MG tablet Take 10 mg by mouth 3 (three) times daily as needed for muscle spasms.   Marland Kitchen dextromethorphan (DELSYM) 30 MG/5ML liquid Take 5 mLs (30 mg total) by mouth 2 (two) times daily.  Marland Kitchen escitalopram (LEXAPRO) 10 MG tablet Take 10 mg by mouth in the morning and at bedtime.   . famotidine (PEPCID) 20 MG  tablet Take 20 mg by mouth daily.  . ferrous sulfate 325 (65 FE) MG tablet Take 325 mg by mouth 2 (two) times daily.  . fluticasone (CUTIVATE) 0.005 % ointment Apply 1 application topically daily.  . hydrochlorothiazide (HYDRODIURIL) 25 MG tablet Take 1 tablet (25 mg total) by mouth daily. For high blood pressure.  . hydroxychloroquine (PLAQUENIL) 200 MG tablet Take 1 tablet (200 mg total) by mouth 2 (two) times daily.  . hydrOXYzine (ATARAX/VISTARIL) 10 MG tablet Take 10 mg by mouth in the morning and at bedtime.  Marland Kitchen ipratropium-albuterol (DUONEB) 0.5-2.5 (3) MG/3ML SOLN Inhale 3 mLs into the lungs every 6 (six) hours as needed (for shortness of breath/wheezing).   . metFORMIN (GLUCOPHAGE) 500 MG tablet Take 1 tablet (500 mg total) by mouth 2 (two) times daily with a meal. For diabetes  . mometasone-formoterol (DULERA) 100-5 MCG/ACT AERO Inhale 2 puffs into the lungs 2 (two) times daily.  . montelukast (SINGULAIR) 10 MG tablet Take 10 mg by mouth at bedtime.  . naproxen (NAPROSYN) 500 MG tablet TAKE 1 TABLET BY MOUTH EVERY 12 HOURS AS NEEDED  . predniSONE (DELTASONE) 10 MG tablet Take 5 tablets (50 mg total) by mouth 2 (two) times daily with a meal. Then 4 tabs two times daily, then 3 tabs two times daily, then 2 tabs two times daily, then 1 tab two times daily  . QUEtiapine (SEROQUEL) 25 MG tablet Take 25 mg by mouth 2 (two) times daily.   Marland Kitchen SPIRIVA RESPIMAT 2.5 MCG/ACT AERS Inhale 2 puffs into the lungs daily.   . traMADol (ULTRAM) 50 MG tablet Take 1 tablet (50 mg total) by mouth every 4 (four) hours as needed (coughing refractory to delsym).  . [DISCONTINUED] Cholecalciferol (VITAMIN D3) 10 MCG (400 UNIT) tablet Take 800 Units by mouth daily.   . [DISCONTINUED] Cholecalciferol 1.25 MG (50000 UT) TABS Take 1 tablet by mouth daily.  . [DISCONTINUED] hydrochlorothiazide (HYDRODIURIL) 12.5 MG tablet Take 1 tablet (12.5 mg total) by mouth daily.  . [DISCONTINUED] hydrochlorothiazide (HYDRODIURIL) 25  MG tablet Take 1 tablet (25 mg total) by mouth daily.  . [DISCONTINUED] hydroxychloroquine (PLAQUENIL) 200 MG tablet Take 200 mg by mouth 2 (two) times daily.  . [DISCONTINUED] metFORMIN (GLUCOPHAGE) 500 MG tablet Take 1 tablet (500 mg total) by mouth 2 (two) times daily with a meal.    ROS:  Review of Systems  Constitutional: Positive for malaise/fatigue. Negative for fever.  Eyes: Positive for blurred vision.  Respiratory: Positive for cough, shortness of breath and wheezing.   Cardiovascular: Positive for chest pain and leg swelling.  Musculoskeletal: Positive for joint pain and  myalgias.     Objective:   Today's Vitals: BP 140/80 (BP Location: Left Arm, Patient Position: Sitting, Cuff Size: Normal)   Pulse 85   Temp 97.7 F (36.5 C) (Temporal)   Ht 5' 5"  (1.651 m)   Wt 296 lb 9.6 oz (134.5 kg)   SpO2 94%   BMI 49.36 kg/m  Vitals with BMI 02/01/2020 01/18/2020 11/29/2019  Height 5' 5"  5' 5"  -  Weight 296 lbs 10 oz 294 lbs 6 oz -  BMI 09.98 33.82 -  Systolic 505 397 673  Diastolic 80 419 87  Pulse 85 86 72     Physical Exam       Assessment and Plan   1. Class 3 severe obesity with serious comorbidity and body mass index (BMI) of 45.0 to 49.9 in adult, unspecified obesity type ([place])   2. Swelling of lower extremity   3. Hypertension, unspecified type   4. Vitamin D deficiency   5. Type 2 diabetes mellitus with hyperglycemia, without long-term current use of insulin ([facility])   6. Rheumatoid arthritis, involving unspecified site, unspecified whether rheumatoid factor present ([place])   7. Cervical cancer screening   8. Routine screening for STI (sexually transmitted infection)   9. Anemia, unspecified type   10. Chest pain, unspecified type      Plan: 1.  We did discuss her weight and she would like to be referred to bariatric surgeon to discuss bariatric surgery.  We will send referral today.  In the meantime I encouraged her to try to focus on a healthy Mediterranean  diet.    2., 3.   For now she continue on her hydrochlorothiazide and we will check metabolic panel for further evaluation of kidney function and electrolytes.  May consider adding furosemide to medication regimen as she is taking this before with better improvement in her swelling as compared to the hydrochlorothiazide based on blood work results.  Blood pressure is much better controlled today on current dose of hydrochlorothiazide so she will continue on this dose pending metabolic panel results.  4.  I did discuss her vitamin D level and that I would recommend she take a vitamin D3 supplement.  She tells me she will try to get this but may not be able to afford it.  I encouraged her to try to buy 5000 IUs of vitamin D3 daily as long as she can afford this.  She tells me she understands.  5., 6.  I discussed her diagnosis of type 2 diabetes and that I would recommend she start a Mediterranean diet.  She tells me she understands.  She would also like to start back on her Metformin as this may help control her blood sugars.  I will send a refill to pharmacy today.  Also sent referral to ophthalmologist for annual diabetic eye exam.  In addition I will refill her Plaquenil prescription while awaiting referral to rheumatology.  She will also be referred to eye doctor for eye exam based on the fact that she takes Plaquenil.  7.  Will refer to OB/GYN for well woman exam and cervical cancer screening.  8.  Will order HIV and syphilis screening per her request today.  9.  Appears stable, she will continue on her iron supplement as currently prescribed.  10.  I encouraged her to follow-up with cardiology once they call her scheduled her appointment.  She tells me she will do this.  Tests ordered Orders Placed This Encounter  Procedures  .  CMP with eGFR([facility])  . HIV antibody (with reflex)  . RPR  . Amb Referral to Bariatric Surgery  . Ambulatory referral to Rheumatology  . Ambulatory referral to  Ophthalmology  . Ambulatory referral to Obstetrics / Gynecology      Meds ordered this encounter  Medications  . DISCONTD: hydrochlorothiazide (HYDRODIURIL) 25 MG tablet    Sig: Take 1 tablet (25 mg total) by mouth daily.    Dispense:  90 tablet    Refill:  0    Order Specific Question:   Supervising Provider    Answer:   Hurshel Party C [[id number]]  . Cholecalciferol 1.25 MG (50000 UT) TABS    Sig: Take 1 tablet by mouth daily.    Dispense:  90 tablet    Refill:  0    Order Specific Question:   Supervising Provider    Answer:   Hurshel Party C [[id number]]  . metFORMIN (GLUCOPHAGE) 500 MG tablet    Sig: Take 1 tablet (500 mg total) by mouth 2 (two) times daily with a meal. For diabetes    Dispense:  60 tablet    Refill:  0    Order Specific Question:   Supervising Provider    Answer:   Hurshel Party C [[id number]]  . hydrochlorothiazide (HYDRODIURIL) 25 MG tablet    Sig: Take 1 tablet (25 mg total) by mouth daily. For high blood pressure.    Dispense:  90 tablet    Refill:  0    Order Specific Question:   Supervising Provider    Answer:   Hurshel Party C [[id number]]  . blood glucose meter kit and supplies    Sig: Dispense based on patient and insurance preference. Use up to four times daily as directed. (FOR ICD-10 E10.9, E11.9).    Dispense:  1 each    Refill:  0    Please prescribe refill of strips and lancets for patient's glucometer    Order Specific Question:   Supervising Provider    Answer:   Doree Albee [[id number]]    Order Specific Question:   Number of strips    Answer:   100    Order Specific Question:   Number of lancets    Answer:   100  . hydroxychloroquine (PLAQUENIL) 200 MG tablet    Sig: Take 1 tablet (200 mg total) by mouth 2 (two) times daily.    Dispense:  60 tablet    Refill:  1    Order Specific Question:   Supervising Provider    Answer:   Doree Albee [[id number]]    Patient to follow-up in 1 month for close monitoring.  Ailene Ards, NP

## 2020-02-02 ENCOUNTER — Other Ambulatory Visit (INDEPENDENT_AMBULATORY_CARE_PROVIDER_SITE_OTHER): Payer: Self-pay | Admitting: Nurse Practitioner

## 2020-02-02 DIAGNOSIS — R609 Edema, unspecified: Secondary | ICD-10-CM

## 2020-02-02 MED ORDER — FUROSEMIDE 20 MG PO TABS: 20.0000 mg | ORAL_TABLET | Freq: Every day | ORAL | 1 refills | Status: DC | PRN

## 2020-02-02 MED ORDER — POTASSIUM CHLORIDE CRYS ER 20 MEQ PO TBCR: EXTENDED_RELEASE_TABLET | ORAL | 1 refills | Status: DC

## 2020-02-02 NOTE — Telephone Encounter (Signed)
Referral was placed with [facility] and they have tried to reach her but shes not answering her phone

## 2020-02-02 NOTE — Progress Notes (Signed)
Elizabeth Bautista, please call patient let her know that we are still waiting on some blood work results.  However, her metabolic panel has resulted and it shows that her kidneys are functioning well and her electrolytes are still normal since we started the hydrochlorothiazide (the new blood pressure medicine).  Because she still experiencing some swelling in her legs I am also going to prescribe a medication called furosemide.  She can take 1 tablet by mouth daily as needed for leg swelling.  When she takes furosemide she also needs to take potassium.  Thus, I am going to prescribe potassium tablets.  So, if she needs to take furosemide to treat her swelling in her legs she will also need to take a potassium tablet with the furosemide.  Please encourage her to follow-up as scheduled in October because we will need to recheck her metabolic panel again to make sure that her kidneys are continue to function okay and that her electrolytes are still normal with the addition of the as-needed furosemide and potassium to her medication list.  If she has any questions please let me know.

## 2020-02-02 NOTE — Telephone Encounter (Signed)
Left a voicmeail to return my call

## 2020-02-02 NOTE — Telephone Encounter (Signed)
Please call the patient and inform her of this if you are able to get in touch with her. Provide her with their phone number so that she can call them back to get her appointment scheduled. Thank you.

## 2020-02-02 NOTE — Progress Notes (Signed)
Left a voicemail to return my call.

## 2020-02-02 NOTE — Addendum Note (Signed)
Addended by: Elenore Paddy on: 02/02/2020 09:59 AM   Modules accepted: Orders

## 2020-02-03 LAB — RPR: RPR Ser Ql: REACTIVE — AB

## 2020-02-03 LAB — COMPLETE METABOLIC PANEL WITH GFR
AG Ratio: 1.5 (calc) (ref 1.0–2.5)
ALT: 17 U/L (ref 6–29)
AST: 13 U/L (ref 10–35)
Albumin: 3.7 g/dL (ref 3.6–5.1)
Alkaline phosphatase (APISO): 80 U/L (ref 31–125)
BUN: 9 mg/dL (ref 7–25)
CO2: 25 mmol/L (ref 20–32)
Calcium: 8.7 mg/dL (ref 8.6–10.2)
Chloride: 105 mmol/L (ref 98–110)
Creat: 0.66 mg/dL (ref 0.50–1.10)
GFR, Est African American: 124 mL/min/{1.73_m2} (ref >=60)
GFR, Est Non African American: 107 mL/min/{1.73_m2} (ref >=60)
Globulin: 2.5 g/dL (calc) (ref 1.9–3.7)
Glucose, Bld: 134 mg/dL — ABNORMAL HIGH (ref 65–99)
Potassium: 3.9 mmol/L (ref 3.5–5.3)
Sodium: 138 mmol/L (ref 135–146)
Total Bilirubin: 0.3 mg/dL (ref 0.2–1.2)
Total Protein: 6.2 g/dL (ref 6.1–8.1)

## 2020-02-03 LAB — HIV ANTIBODY (ROUTINE TESTING W REFLEX): HIV 1&2 Ab, 4th Generation: NONREACTIVE

## 2020-02-03 LAB — FLUORESCENT TREPONEMAL AB(FTA)-IGG-BLD: Fluorescent Treponemal ABS: NONREACTIVE

## 2020-02-03 LAB — RPR TITER: RPR Titer: 1:2 {titer} — ABNORMAL HIGH

## 2020-02-09 ENCOUNTER — Ambulatory Visit (INDEPENDENT_AMBULATORY_CARE_PROVIDER_SITE_OTHER): Payer: Medicaid Other | Admitting: Internal Medicine

## 2020-02-09 ENCOUNTER — Other Ambulatory Visit: Payer: Self-pay

## 2020-02-09 ENCOUNTER — Encounter: Payer: Self-pay | Admitting: Internal Medicine

## 2020-02-09 VITALS — BP 144/92 | HR 84 | Temp 98.1°F | Ht 64.0 in | Wt 287.4 lb

## 2020-02-09 DIAGNOSIS — J4521 Mild intermittent asthma with (acute) exacerbation: Secondary | ICD-10-CM | POA: Diagnosis not present

## 2020-02-09 DIAGNOSIS — R058 Other specified cough: Secondary | ICD-10-CM

## 2020-02-09 DIAGNOSIS — Z6841 Body Mass Index (BMI) 40.0 and over, adult: Secondary | ICD-10-CM | POA: Diagnosis not present

## 2020-02-09 DIAGNOSIS — R05 Cough: Secondary | ICD-10-CM

## 2020-02-09 MED ORDER — STIOLTO RESPIMAT 2.5-2.5 MCG/ACT IN AERS: 2.0000 | INHALATION_SPRAY | Freq: Every day | RESPIRATORY_TRACT | 0 refills | Status: DC

## 2020-02-09 MED ORDER — TRAMADOL HCL 50 MG PO TABS
50.0000 mg | ORAL_TABLET | ORAL | 0 refills | Status: DC | PRN
Start: 2020-02-09 — End: 2020-02-20

## 2020-02-09 MED ORDER — PANTOPRAZOLE SODIUM 40 MG PO TBEC: 40.0000 mg | DELAYED_RELEASE_TABLET | Freq: Every day | ORAL | 2 refills | Status: DC

## 2020-02-09 MED ORDER — PREDNISONE 10 MG PO TABS: ORAL_TABLET | ORAL | 0 refills | Status: DC

## 2020-02-09 MED ORDER — BREZTRI AEROSPHERE 160-9-4.8 MCG/ACT IN AERO: 2.0000 | INHALATION_SPRAY | Freq: Two times a day (BID) | RESPIRATORY_TRACT | 0 refills | Status: DC

## 2020-02-09 MED ORDER — ALBUTEROL SULFATE HFA 108 (90 BASE) MCG/ACT IN AERS: INHALATION_SPRAY | RESPIRATORY_TRACT | 11 refills | Status: DC

## 2020-02-09 NOTE — Patient Instructions (Addendum)
Plan A = Automatic = Always=    Stiolto 2 puffs each am  - take two good deep slow drags  Prednisone 10 mg take 2 until 100% better then 1 daily until seen  Protonix (pantoprazole 40 mg) Take 30-60 min before first meal of the day and take pepcid (famotidine) after supper and  Diet below   Plan B = Backup (to supplement plan A, not to replace it) Only use your albuterol (Proair) inhaler as a rescue medication to be used if you can't catch your breath by resting or doing a relaxed purse lip breathing pattern.  - The less you use it, the better it will work when you need it. - Ok to use the inhaler up to 2 puffs  every 4 hours if you must but call for appointment if use goes up over your usual need - Don't leave home without it !!  (think of it like the spare tire for your car)   Late add Plan C - albuterol neb if tries plan B and it doesn't work   GERD (REFLUX)  is an extremely common cause of respiratory symptoms just like yours , many times with no obvious heartburn at all.    It can be treated with medication, but also with lifestyle changes including elevation of the head of your bed (ideally with 6 -8inch blocks under the headboard of your bed),  Smoking cessation, avoidance of late meals, excessive alcohol, and avoid fatty foods, chocolate, peppermint, colas, red wine, and acidic juices such as orange juice.  NO MINT OR MENTHOL PRODUCTS SO NO COUGH DROPS  USE SUGARLESS CANDY INSTEAD (Jolley ranchers or Stover's or Life Savers) or even ice chips will also do - the key is to swallow to prevent all throat clearing. NO OIL BASED VITAMINS - use powdered substitutes.  Avoid fish oil when coughing.   Please schedule a follow up office visit in 2  weeks, sooner if needed bring all medications separate bags

## 2020-02-09 NOTE — Progress Notes (Addendum)
Elizabeth Bautista, female    DOB: 01-26-1976,    MRN: [id number]   Brief patient profile:  44 yobf  Quit smoking 06/2014  with allergies as child and new onset "asthma" during difficult delivery 08/08/98 which resulted in death of her baby and "never right since despite quit smoking in 2014/08/08 much worse since summer 2020 req freq prednisone rec then admit   Admit date: 10/18/2019 Discharge date: 10/23/2019  Admitted From: Home Disposition:  Home   Recommendations for Outpatient Follow-up:  1. Follow up with PCP in 1-2 weeks 2. Please obtain BMP/CBC in one week    Discharge Condition: Stable CODE STATUS: FULL Diet recommendation: Heart Healthy / Carb Modified   Brief/Interim Summary: 44 y.o.femalewith medical history significant forasthma, anemia, seizures, sleep apneapresents with 2 weeks of sob, cough and wheeze.Pt has been using Albuterol nebulizer at home every 4 hours without relief. Pt went to Urgent Care5/18/21and they sent pt to ED. Patient lives in [place], and has been in [place] visiting for the past 2 months. Is undecided if she is going to move to [place]. She reports admissions in the past for asthma exacerbations in [place]. She has never required intubation  ED Course:O2 sats 98% on room air temperature 98.2. WBC 5.8. Portable chest x-ray without acute abnormality. COVID-19 test negative. DuoNebs, hour-long albuterol nebulizer, Ativan 0.5 mg, Solu-Medrol 125 mg, magnesium, given, still with significant wheezing and increased work of breathing hence hospitalist was called for admission for asthma exacerbation.   Discharge Diagnoses:  Asthma exacerbation -Continue experiencing shortness of breath with exertion, wheezing and tachypnea. -continue CPAP hs use -Continue duonebs andBrovana -ContinueIV solumedrol>>>prednisone taper -personally reviewed CXR--no edema or consolidation -appreciate pulmonary -continue singulair -CT  maxillofacial--Normal non-contrast Face CT -10/22/19--more chest tightness and sob--> -repeat CXR--no consolidation or edema -personally reviewed EKG--sinus, nonspecific TWI -D-dimer--neg -check troponins 2-->3 -given mag -10/23/19--breathing much improved, near baseline -home with Symbicort (insurance will not pay for Dulera)  morbidobesityandsleep apnea -continue BIPAP QHS -Body mass index is 49.56 kg/m. -lifestyle modification  anxiety/Depression -No suicidal ideation or hallucination -Continue Seroquel, BuSpar, Lexapro and as needed hydroxyzine.  Rheumatoid arthritis ([place]) -continue plaquenil  GERD/GI prophylaxis -continue Pepcid.  hyperglycemia/Diabetes Mellitus type 2 -no prior hx of diabetes -positive family hx and obesity -steroids most likely contributing -A1C--6.6 -increasedlevemir to 15units -escalate to resistant SSI -d/c home with metformin -f/u PCP to adjust regimen      History of Present Illness  02/09/2020  Pulmonary/ 1st office eval/ Elizabeth Bautista / [facility] / post hosp f/u  Chief Complaint  Patient presents with  . Consult    shortness of breath, productive cough with clear phlegm  80% better prednisone taper x one week  > rx by [facility] multiple courses of pred but none x one month and worse.  At baseline same appt no new pets/ dulera x 2/ spiriva  Dyspnea:  Room to room  Cough: dry cough Sleep: worse with cpap with 4 pillows and bed is flat  SABA use:  Last used 3 h prior   No obvious day to day or daytime variability or assoc excess/ purulent sputum or mucus plugs or hemoptysis or cp or chest tightness, subjective wheeze or overt sinus or hb symptoms.     Also denies any obvious fluctuation of symptoms with weather or environmental changes or other aggravating or alleviating factors except as outlined above   No unusual exposure hx or h/o childhood pna/ asthma or knowledge of premature birth.  Current Allergies, Complete Past Medical  History, Past Surgical History, Family History, and Social History were reviewed in [employer] record.  ROS  The following are not active complaints unless bolded Hoarseness, sore throat, dysphagia, dental problems, itching, sneezing,  nasal congestion or discharge of excess mucus or purulent secretions, ear ache,   fever, chills, sweats, unintended wt loss or wt gain, classically pleuritic or exertional cp,  orthopnea pnd or arm/hand swelling  or leg swelling, presyncope, palpitations, abdominal pain, anorexia, nausea, vomiting, diarrhea  or change in bowel habits or change in bladder habits, change in stools or change in urine, dysuria, hematuria,  rash, arthralgias, visual complaints, headache, numbness, weakness or ataxia or problems with walking or coordination,  change in mood or  memory.           Past Medical History:  Diagnosis Date  . Anemia   . Asthma   . Edema   . HTN (hypertension)   . Seizures ([place])    last seizure 2019  . Sleep apnea   . Sleep apnea     Outpatient Medications Prior to Visit  Medication Sig Dispense Refill  . albuterol (VENTOLIN HFA) 108 (90 Base) MCG/ACT inhaler Inhale 1-2 puffs into the lungs every 6 (six) hours as needed for wheezing or shortness of breath. 18 g 3  . blood glucose meter kit and supplies Dispense based on patient and insurance preference. Use up to four times daily as directed. (FOR ICD-10 E10.9, E11.9). 1 each 0  . Blood Pressure Monitoring (SPHYGMOMANOMETER) MISC 1 each by Does not apply route daily. 1 each 0  . busPIRone (BUSPAR) 7.5 MG tablet Take 7.5 mg by mouth 2 (two) times daily.    . Cholecalciferol 1.25 MG (50000 UT) TABS Take 1 tablet by mouth daily. 90 tablet 0  . cyclobenzaprine (FLEXERIL) 10 MG tablet Take 10 mg by mouth 3 (three) times daily as needed for muscle spasms.     Marland Kitchen dextromethorphan (DELSYM) 30 MG/5ML liquid Take 5 mLs (30 mg total) by mouth 2 (two) times daily. 89 mL 0  . escitalopram  (LEXAPRO) 10 MG tablet Take 10 mg by mouth in the morning and at bedtime.     . famotidine (PEPCID) 20 MG tablet Take 20 mg by mouth daily.    . ferrous sulfate 325 (65 FE) MG tablet Take 325 mg by mouth 2 (two) times daily.    . fluticasone (CUTIVATE) 0.005 % ointment Apply 1 application topically daily.    . furosemide (LASIX) 20 MG tablet Take 1 tablet (20 mg total) by mouth daily as needed for edema. 30 tablet 1  . hydrochlorothiazide (HYDRODIURIL) 25 MG tablet Take 1 tablet (25 mg total) by mouth daily. For high blood pressure. 90 tablet 0  . hydroxychloroquine (PLAQUENIL) 200 MG tablet Take 1 tablet (200 mg total) by mouth 2 (two) times daily. 60 tablet 1  . hydrOXYzine (ATARAX/VISTARIL) 10 MG tablet Take 10 mg by mouth in the morning and at bedtime.    Marland Kitchen ipratropium-albuterol (DUONEB) 0.5-2.5 (3) MG/3ML SOLN Inhale 3 mLs into the lungs every 6 (six) hours as needed (for shortness of breath/wheezing).     . metFORMIN (GLUCOPHAGE) 500 MG tablet Take 1 tablet (500 mg total) by mouth 2 (two) times daily with a meal. For diabetes 60 tablet 0  . mometasone-formoterol (DULERA) 100-5 MCG/ACT AERO Inhale 2 puffs into the lungs 2 (two) times daily. 8.8 g 1  . montelukast (SINGULAIR) 10 MG tablet Take 10  mg by mouth at bedtime.    . naproxen (NAPROSYN) 500 MG tablet TAKE 1 TABLET BY MOUTH EVERY 12 HOURS AS NEEDED 60 tablet 2  . potassium chloride SA (KLOR-CON) 20 MEQ tablet Take 1 tablet by mouth daily ONLY when you take furosemide. 30 tablet 1  . QUEtiapine (SEROQUEL) 25 MG tablet Take 25 mg by mouth 2 (two) times daily.     Marland Kitchen SPIRIVA RESPIMAT 2.5 MCG/ACT AERS Inhale 2 puffs into the lungs daily.     . traMADol (ULTRAM) 50 MG tablet Take 1 tablet (50 mg total) by mouth every 4 (four) hours as needed (coughing refractory to delsym). 20 tablet 0  . predniSONE (DELTASONE) 10 MG tablet Take 5 tablets (50 mg total) by mouth 2 (two) times daily with a meal. Then 4 tabs two times daily, then 3 tabs two times  daily, then 2 tabs two times daily, then 1 tab two times daily (Patient not taking: Reported on 02/09/2020) 35 tablet 0   No facility-administered medications prior to visit.     Objective:     BP (!) 144/92 (BP Location: Left Arm, Cuff Size: Normal)   Pulse 84   Temp 98.1 F (36.7 C) (Other (Comment)) Comment (Src): wrist  Ht _0  (1.626 m)   Wt 287 lb 6.4 oz (130.4 kg)   SpO2 97% Comment: room air  BMI 49.33 kg/m   SpO2: 97 % (room air)  Obese bf prominent pw / cough on insp   HEENT : pt wearing mask not removed for exam due to covid -19 concerns.    NECK :  without JVD/Nodes/TM/ nl carotid upstrokes bilaterally   LUNGS: no acc muscle use,  Nl contour chest which is clear to A and P bilaterally without cough on insp or exp maneuvers   CV:  RRR  no s3 or murmur or increase in P2, and no edema   ABD:  soft and nontender with nl inspiratory excursion in the supine position. No bruits or organomegaly appreciated, bowel sounds nl  MS:  Nl gait/ ext warm without deformities, calf tenderness, cyanosis or clubbing No obvious joint restrictions   SKIN: warm and dry without lesions    NEURO:  alert, approp, nl sensorium with  no motor or cerebellar deficits apparent.     I personally reviewed images and agree with radiology impression as follows:  CXR:   10/22/19  1. Hypoventilatory changes left base.  No acute process.    Assessment   Asthma exacerbation Onset 2000 p death of baby during delivery with prominent vcd features  - 02/09/2020  After extensive coaching inhaler device,  effectiveness =    90% with smi but even the empty device makes her cough (see uacs)   DDX of  difficult airways management almost all start with A and  include Adherence, Ace Inhibitors, Acid Reflux, Active Sinus Disease, Alpha 1 Antitripsin deficiency, Anxiety masquerading as Airways dz,  ABPA,  Allergy(esp in young), Aspiration (esp in elderly), Adverse effects of meds,  Active smoking or  vaping, A bunch of PE's (a small clot burden can't cause this syndrome unless there is already severe underlying pulm or vascular dz with poor reserve) plus two Bs  = Bronchiectasis and Beta blocker use..and one C= CHF  Adherence is always the initial "prime suspect" and is a multilayered concern that requires a "trust but verify" approach in every patient - starting with knowing how to use medications, especially inhalers, correctly, keeping up with refills and  understanding the fundamental difference between maintenance and prns vs those medications only taken for a very short course and then stopped and not refilled.  -see hfa teaching  - return q 2 weeks for now with all meds in hand using a trust but verify approach to confirm accurate Medication  Reconciliation The principal here is that until we are certain that the  patients are doing what we've asked, it makes no sense to ask them to do more.   ? Allergy >  Continue singulair/ pred 20 mg until better then 10 mg per day until return  - since so sensitive to inhalers try just stiolto 2 puff daily for now and avoid ics in favor of systemic get her back to 100% then rechallenge with ICS next ov   ? Acid (or non-acid) GERD > always difficult to exclude as up to 75% of pts in some series report no assoc GI/ Heartburn symptoms> rec max (24h)  acid suppression and diet restrictions/ reviewed and instructions given in writing.   ? Anxiety > usually at the bottom of this list of usual suspects but should be much higher on this pt's based on H and P and note already on psychotropics and may interfere with adherence and also interpretation of response or lack thereof to symptom management which can be quite subjective.      Upper airway cough syndrome Upper airway cough syndrome (previously labeled PNDS),  is so named because it's frequently impossible to sort out how much is  CR/sinusitis with freq throat clearing (which can be related to primary  GERD)   vs  causing  secondary (" extra esophageal")  GERD from wide swings in gastric pressure that occur with throat clearing, often  promoting self use of mint and menthol lozenges that reduce the lower esophageal sphincter tone and exacerbate the problem further in a cyclical fashion.   These are the same pts (now being labeled as having "irritable larynx syndrome" by some cough centers) who not infrequently have a history of having failed to tolerate ace inhibitors,  dry powder inhalers or biphosphonates or report having atypical/extraesophageal reflux symptoms that don't respond to standard doses of PPI  and are easily confused as having aecopd or asthma flares by even experienced allergists/ pulmonologists (myself included).   Of the three most common causes of  Sub-acute / recurrent or chronic cough, only one (GERD)  can actually contribute to/ trigger  the other two (asthma and post nasal drip syndrome)  and perpetuate the cylce of cough.  While not intuitively obvious, many patients with chronic low grade reflux do not cough until there is a primary insult that disturbs the protective epithelial barrier and exposes sensitive nerve endings.   This is typically viral but can due to PNDS and  either may apply here.     >>> The point is that once this occurs, it is difficult to eliminate the cycle  using anything but a maximally effective acid suppression regimen at least in the short run, accompanied by an appropriate diet to address non acid GERD and control / eliminate the cough itself for at least 3 days with tramadol      Class 3 severe obesity with serious comorbidity and body mass index (BMI) of 45.0 to 49.9 in adult [facility]) Body mass index is 49.33 kg/m.  -  Lab Results  Component Value Date   TSH 0.97 01/18/2020     Contributing to gerd risk/ doe/reviewed the need and the  process to achieve and maintain neg calorie balance > defer f/u primary care including intermittently monitoring  thyroid status            Each maintenance medication was reviewed in detail including emphasizing most importantly the difference between maintenance and prns and under what circumstances the prns are to be triggered using an action plan format where appropriate.  Total time for H and P, chart review, counseling, teaching device and generating customized AVS unique to this office visit / charting = 51 min          Christinia Gully, MD 02/09/2020

## 2020-02-10 ENCOUNTER — Encounter: Payer: Self-pay | Admitting: Internal Medicine

## 2020-02-10 DIAGNOSIS — R058 Other specified cough: Secondary | ICD-10-CM | POA: Insufficient documentation

## 2020-02-10 NOTE — Assessment & Plan Note (Signed)
Onset 2000 p death of baby during delivery with prominent vcd features  - 02/09/2020  After extensive coaching inhaler device,  effectiveness =    90% with smi but even the empty device makes her cough (see uacs)   DDX of  difficult airways management almost all start with A and  include Adherence, Ace Inhibitors, Acid Reflux, Active Sinus Disease, Alpha 1 Antitripsin deficiency, Anxiety masquerading as Airways dz,  ABPA,  Allergy(esp in young), Aspiration (esp in elderly), Adverse effects of meds,  Active smoking or vaping, A bunch of PE's (a small clot burden can't cause this syndrome unless there is already severe underlying pulm or vascular dz with poor reserve) plus two Bs  = Bronchiectasis and Beta blocker use..and one C= CHF  Adherence is always the initial "prime suspect" and is a multilayered concern that requires a "trust but verify" approach in every patient - starting with knowing how to use medications, especially inhalers, correctly, keeping up with refills and understanding the fundamental difference between maintenance and prns vs those medications only taken for a very short course and then stopped and not refilled.  -see hfa teaching  - return q 2 weeks for now with all meds in hand using a trust but verify approach to confirm accurate Medication  Reconciliation The principal here is that until we are certain that the  patients are doing what we've asked, it makes no sense to ask them to do more.   ? Allergy >  Continue singulair/ pred 20 mg until better then 10 mg per day until return  - since so sensitive to inhalers try just stiolto 2 puff daily for now and avoid ics in favor of systemic get her back to 100% then rechallenge with ICS next ov   ? Acid (or non-acid) GERD > always difficult to exclude as up to 75% of pts in some series report no assoc GI/ Heartburn symptoms> rec max (24h)  acid suppression and diet restrictions/ reviewed and instructions given in writing.   ? Anxiety >  usually at the bottom of this list of usual suspects but should be much higher on this pt's based on H and P and note already on psychotropics and may interfere with adherence and also interpretation of response or lack thereof to symptom management which can be quite subjective.

## 2020-02-10 NOTE — Assessment & Plan Note (Signed)
Body mass index is 49.33 kg/m.  -  Lab Results  Component Value Date   TSH 0.97 01/18/2020     Contributing to gerd risk/ doe/reviewed the need and the process to achieve and maintain neg calorie balance > defer f/u primary care including intermittently monitoring thyroid status            Each maintenance medication was reviewed in detail including emphasizing most importantly the difference between maintenance and prns and under what circumstances the prns are to be triggered using an action plan format where appropriate.  Total time for H and P, chart review, counseling, teaching device and generating customized AVS unique to this office visit / charting = 51 min

## 2020-02-10 NOTE — Assessment & Plan Note (Signed)
Upper airway cough syndrome (previously labeled PNDS),  is so named because it's frequently impossible to sort out how much is  CR/sinusitis with freq throat clearing (which can be related to primary GERD)   vs  causing  secondary (" extra esophageal")  GERD from wide swings in gastric pressure that occur with throat clearing, often  promoting self use of mint and menthol lozenges that reduce the lower esophageal sphincter tone and exacerbate the problem further in a cyclical fashion.   These are the same pts (now being labeled as having "irritable larynx syndrome" by some cough centers) who not infrequently have a history of having failed to tolerate ace inhibitors,  dry powder inhalers or biphosphonates or report having atypical/extraesophageal reflux symptoms that don't respond to standard doses of PPI  and are easily confused as having aecopd or asthma flares by even experienced allergists/ pulmonologists (myself included).   Of the three most common causes of  Sub-acute / recurrent or chronic cough, only one (GERD)  can actually contribute to/ trigger  the other two (asthma and post nasal drip syndrome)  and perpetuate the cylce of cough.  While not intuitively obvious, many patients with chronic low grade reflux do not cough until there is a primary insult that disturbs the protective epithelial barrier and exposes sensitive nerve endings.   This is typically viral but can due to PNDS and  either may apply here.     >>> The point is that once this occurs, it is difficult to eliminate the cycle  using anything but a maximally effective acid suppression regimen at least in the short run, accompanied by an appropriate diet to address non acid GERD and control / eliminate the cough itself for at least 3 days with tramadol

## 2020-02-13 ENCOUNTER — Other Ambulatory Visit (INDEPENDENT_AMBULATORY_CARE_PROVIDER_SITE_OTHER): Payer: Self-pay | Admitting: Internal Medicine

## 2020-02-13 ENCOUNTER — Telehealth (INDEPENDENT_AMBULATORY_CARE_PROVIDER_SITE_OTHER): Payer: Self-pay

## 2020-02-13 DIAGNOSIS — R569 Unspecified convulsions: Secondary | ICD-10-CM

## 2020-02-13 MED ORDER — PREDNISONE 20 MG PO TABS: 40.0000 mg | ORAL_TABLET | Freq: Every day | ORAL | 1 refills | Status: DC

## 2020-02-13 NOTE — Telephone Encounter (Signed)
Okay, I have put the order in to refer to neurology, [facility].

## 2020-02-13 NOTE — Telephone Encounter (Signed)
Pt stated she was put on this from pulmonary provider also currently. So she does not come to see Dr. Karilyn Cota until Oct 12. Pt said she has to get some type of relief for someone. Can we send her to Dr Gerilyn Pilgrim or [facility]? She will have to have transportation to get there.

## 2020-02-13 NOTE — Telephone Encounter (Signed)
Pt called Friday left voicemail that she was in great deal of pain due to her seizures she had on Wed & Thursday. Total of 3. Pt was found on floor Wed by son; unknown how long she was out. Then on Thursday her DTR heard her hit floor when she was on the way to bathroom. She had two . The length was  3 min for 1st one & 8 min's for the last one. Pt is calling to see if she can have something for pain & refill of RA medications .

## 2020-02-13 NOTE — Telephone Encounter (Signed)
I see that she was previously on tramadol.  Tramadol can have a side effect of seizures so I cannot prescribe this.  I will not prescribe any stronger medications than tramadol.  The only option we are left with is over-the-counter Tylenol or over-the-counter ibuprofen.  If she decides to take ibuprofen over-the-counter, she should take a dose of 600 mg (3 tablets) 3 times a day with food and only for 3 days.

## 2020-02-13 NOTE — Telephone Encounter (Signed)
Pt started that the OTC does not help. Pt said she was given 800 , but it help a little bit. But said she would need something to help other then what you recommended.

## 2020-02-13 NOTE — Telephone Encounter (Signed)
Okay, I just sent over as a urgent visit.

## 2020-02-13 NOTE — Telephone Encounter (Signed)
Prednisone to [facility].

## 2020-02-14 ENCOUNTER — Telehealth (INDEPENDENT_AMBULATORY_CARE_PROVIDER_SITE_OTHER): Payer: Self-pay

## 2020-02-14 NOTE — Telephone Encounter (Signed)
I did discuss the situation with Dr. Karilyn Cota and have reviewed previous medical records and I do see that she has taken oxycodone-acetaminophen in the past for pain, however there is a serious possible side effect of seizures with this medication as well.  I agree with Dr. Karilyn Cota that she should take over-the-counter medications such as Tylenol and/or ibuprofen.  She can take Tylenol and ibuprofen together and this may result in improvement in her pain.  I would recommend she take no more than 1000 mg of Tylenol 3 times a day and/or 800 mg ibuprofen 3 times a day.  If she takes ibuprofen she should make sure to take it with a small amount of food to protect the lining of her stomach and should limit ibuprofen to 3 to 5 days.

## 2020-02-15 ENCOUNTER — Telehealth (INDEPENDENT_AMBULATORY_CARE_PROVIDER_SITE_OTHER): Payer: Self-pay

## 2020-02-15 ENCOUNTER — Encounter (INDEPENDENT_AMBULATORY_CARE_PROVIDER_SITE_OTHER): Payer: Self-pay

## 2020-02-15 NOTE — Telephone Encounter (Signed)
Return call to patient. No answer left the voicemail with the details given Per Dr Karilyn Cota & Jiles Prows ,NP. No answer back since this call. Also notified patient that to answer all call to setup her appointments to see for Neurology, Ortho, bariatrics, eye Dr for all appointments.

## 2020-02-16 ENCOUNTER — Encounter (INDEPENDENT_AMBULATORY_CARE_PROVIDER_SITE_OTHER): Payer: Self-pay | Admitting: Nurse Practitioner

## 2020-02-16 ENCOUNTER — Encounter: Payer: Self-pay | Admitting: Emergency Medicine

## 2020-02-16 ENCOUNTER — Other Ambulatory Visit: Payer: Self-pay

## 2020-02-16 ENCOUNTER — Telehealth (INDEPENDENT_AMBULATORY_CARE_PROVIDER_SITE_OTHER): Payer: Self-pay

## 2020-02-16 ENCOUNTER — Ambulatory Visit
Admission: EM | Admit: 2020-02-16 | Discharge: 2020-02-16 | Disposition: A | Discharge status: Home / Self Care | Payer: Medicaid Other | Attending: Emergency Medicine | Admitting: Emergency Medicine

## 2020-02-16 DIAGNOSIS — J069 Acute upper respiratory infection, unspecified: Secondary | ICD-10-CM

## 2020-02-16 DIAGNOSIS — Z1152 Encounter for screening for COVID-19: Secondary | ICD-10-CM

## 2020-02-16 DIAGNOSIS — R5383 Other fatigue: Secondary | ICD-10-CM

## 2020-02-16 NOTE — Telephone Encounter (Signed)
Patient sounds quite ill. I agree I think she needs to be evaluated urgently or even emergently. As we do not have any openings for a visit today here, I recommend she proceed to the emergency department. I see you have already mentioned this to her, when you call her back please let her know that you discussed this with me and that I do think she needs to go to the emergency department as soon as possible. I'm concerned that she may have Covid and I believe she needs to be tested for this as part of her evaluation for her symptoms.

## 2020-02-16 NOTE — Telephone Encounter (Signed)
Sending referral over to Dr Sharl Ma.

## 2020-02-16 NOTE — Discharge Instructions (Addendum)
COVID testing ordered.  It will take between 2-7 days for test results.  Someone will contact you regarding abnormal results.    In the meantime: You should remain isolated in your home for 10 days from symptom onset AND greater than 24 hours after symptoms resolution (absence of fever without the use of fever-reducing medication and improvement in respiratory symptoms), whichever is longer Get plenty of rest and push fluids Continue to take albuterol, prednisone and Tessalon Perles as prescribed by Pulmonologist Follow-up with PCP Use OTC medications like ibuprofen or tylenol as needed fever or pain Call or go to the ED if you have any new or worsening symptoms such as fever, worsening cough, shortness of breath, chest tightness, chest pain, turning blue, changes in mental status, etc..Marland Kitchen

## 2020-02-16 NOTE — Telephone Encounter (Signed)
Return call to patient she left voicemail on line 2. Pt sounded very weak, congested, and tearful. Ask her symptoms, pt stated she has been sick everysince 02/13/20. Pt was very weak, and complaining of all medications she is taking and feels this is what made her have seizures, fatigue sickness. But pt said she did not get half of medications is because she has not money to do anything or pay for a cab.    Pt stated Her kids has set her up into her room. They put all her things she needed in room so she would not have to come out to do anything. Kids are in high school and 1 son who is  not in school;looking for work.   Pt was asked to go to the ER/ Urgent care. Pt said she did not want to go. Because she feels much better. Pt does not sound that she is at all on phone calls left on voicemail. Pt ask ed did she want to call 911 or have transportation?  Pt said no, she had no money to do that. I ,Hyun Marsalis, RMA offer to pay for a round trip lift to pick her up to go to urgent care. Pt  stated "this is ludicrous"! Why would II do this and I cannot walk and may fall or something.   Then I said well this is for a 911 call  to transport if you cannot walk or do ADL's . Pt said no,she will call her son. He was gone to look for a job . So he can go with her. Pt seems very non-compliant when offered several arrangements. Told pt I would call her back once I can have Sarah,NP to review notes and call back.

## 2020-02-16 NOTE — ED Provider Notes (Addendum)
[facility]   [id number] 02/16/20 Arrival Time: 1694   CC: COVID symptoms  SUBJECTIVE: History from: patient.  Elizabeth Bautista is a 44 y.o. female history of asthma presents to the urgent care with a complaint of cough, congestion, fatigue for the past couple days.  Denies sick exposure to COVID, flu or strep.  Denies recent travel.  Has tried albuterol with no relief.  Denies aggravating factors.  Denies previous symptoms in the past.   Denies fever, chills, sinus pain, rhinorrhea, sore throat, SOB, wheezing, chest pain, nausea, changes in bowel or bladder habits.   Patient states she was told by PCP to have labs and COVID test completed.  ROS: As per HPI.  All other pertinent ROS negative.     Past Medical History:  Diagnosis Date   Anemia    Asthma    Edema    HTN (hypertension)    Seizures ([place])    last seizure 2019   Sleep apnea    Sleep apnea    Past Surgical History:  Procedure Laterality Date   APPENDECTOMY     CESAREAN SECTION     x3   Allergies  Allergen Reactions   Phenytoin Anaphylaxis    Other reaction(s): swelling and heart rate decreased   Phenylbutazones    No current facility-administered medications on file prior to encounter.   Current Outpatient Medications on File Prior to Encounter  Medication Sig Dispense Refill   albuterol (PROAIR HFA) 108 (90 Base) MCG/ACT inhaler 2 puffs every 4 hours as needed only  if your can't catch your breath 18 g 11   blood glucose meter kit and supplies Dispense based on patient and insurance preference. Use up to four times daily as directed. (FOR ICD-10 E10.9, E11.9). 1 each 0   Blood Pressure Monitoring (SPHYGMOMANOMETER) MISC 1 each by Does not apply route daily. 1 each 0   busPIRone (BUSPAR) 7.5 MG tablet Take 7.5 mg by mouth 2 (two) times daily.     Cholecalciferol 1.25 MG (50000 UT) TABS Take 1 tablet by mouth daily. 90 tablet 0   cyclobenzaprine (FLEXERIL) 10 MG tablet Take 10 mg by mouth  3 (three) times daily as needed for muscle spasms.      dextromethorphan (DELSYM) 30 MG/5ML liquid Take 5 mLs (30 mg total) by mouth 2 (two) times daily. 89 mL 0   escitalopram (LEXAPRO) 10 MG tablet Take 10 mg by mouth in the morning and at bedtime.      famotidine (PEPCID) 20 MG tablet Take 20 mg by mouth daily.     ferrous sulfate 325 (65 FE) MG tablet Take 325 mg by mouth 2 (two) times daily.     fluticasone (CUTIVATE) 0.005 % ointment Apply 1 application topically daily.     furosemide (LASIX) 20 MG tablet Take 1 tablet (20 mg total) by mouth daily as needed for edema. 30 tablet 1   hydrochlorothiazide (HYDRODIURIL) 25 MG tablet Take 1 tablet (25 mg total) by mouth daily. For high blood pressure. 90 tablet 0   hydroxychloroquine (PLAQUENIL) 200 MG tablet Take 1 tablet (200 mg total) by mouth 2 (two) times daily. 60 tablet 1   hydrOXYzine (ATARAX/VISTARIL) 10 MG tablet Take 10 mg by mouth in the morning and at bedtime.     metFORMIN (GLUCOPHAGE) 500 MG tablet Take 1 tablet (500 mg total) by mouth 2 (two) times daily with a meal. For diabetes 60 tablet 0   montelukast (SINGULAIR) 10 MG tablet Take 10 mg  by mouth at bedtime.     naproxen (NAPROSYN) 500 MG tablet TAKE 1 TABLET BY MOUTH EVERY 12 HOURS AS NEEDED 60 tablet 2   pantoprazole (PROTONIX) 40 MG tablet Take 1 tablet (40 mg total) by mouth daily. Take 30-60 min before first meal of the day 30 tablet 2   potassium chloride SA (KLOR-CON) 20 MEQ tablet Take 1 tablet by mouth daily ONLY when you take furosemide. 30 tablet 1   predniSONE (DELTASONE) 10 MG tablet 2 daily until 100% better then 1 daily 60 tablet 0   predniSONE (DELTASONE) 20 MG tablet Take 2 tablets (40 mg total) by mouth daily with breakfast. 10 tablet 1   QUEtiapine (SEROQUEL) 25 MG tablet Take 25 mg by mouth 2 (two) times daily.      Tiotropium Bromide-Olodaterol (STIOLTO RESPIMAT) 2.5-2.5 MCG/ACT AERS Inhale 2 puffs into the lungs daily. 4 g 0   traMADol  (ULTRAM) 50 MG tablet Take 1 tablet (50 mg total) by mouth every 4 (four) hours as needed (coughing refractory to delsym). 20 tablet 0   Social History   Socioeconomic History   Marital status: Single    Spouse name: Not on file   Number of children: Not on file   Years of education: Not on file   Highest education level: Not on file  Occupational History   Not on file  Tobacco Use   Smoking status: Former Smoker    Years: 10.00    Types: Cigarettes    Quit date: 06/02/2014    Years since quitting: 5.7   Smokeless tobacco: Never Used   Tobacco comment: during the 10 years of smoking, smoked 2-3 cigarettes/day  Vaping Use   Vaping Use: Never used  Substance and Sexual Activity   Alcohol use: Not Currently   Drug use: Not Currently   Sexual activity: Not on file  Other Topics Concern   Not on file  Social History Narrative   Not on file   Social Determinants of Health   Financial Resource Strain:    Difficulty of Paying Living Expenses: Not on file  Food Insecurity:    Worried About [place] in the Last Year: Not on file   Ran Out of Food in the Last Year: Not on file  Transportation Needs:    Lack of Transportation (Medical): Not on file   Lack of Transportation (Non-Medical): Not on file  Physical Activity:    Days of Exercise per Week: Not on file   Minutes of Exercise per Session: Not on file  Stress:    Feeling of Stress : Not on file  Social Connections:    Frequency of Communication with Friends and Family: Not on file   Frequency of Social Gatherings with Friends and Family: Not on file   Attends Religious Services: Not on file   Active Member of Clubs or Organizations: Not on file   Attends Archivist Meetings: Not on file   Marital Status: Not on file  Intimate Partner Violence:    Fear of Current or Ex-Partner: Not on file   Emotionally Abused: Not on file   Physically Abused: Not on file   Sexually  Abused: Not on file   Family History  Family history unknown: Yes    OBJECTIVE:  Vitals:   02/16/20 1528 02/16/20 1529  BP: 130/82   Pulse: 80   Resp: (!) 21   Temp: 98.3 F (36.8 C)   TempSrc: Oral   SpO2: 97%  Weight:  282 lb (127.9 kg)  Height:  5' 4"  (1.626 m)     General appearance: alert; appears fatigued, but nontoxic; speaking in full sentences and tolerating own secretions HEENT: NCAT; Ears: EACs clear, TMs pearly gray; Eyes: PERRL.  EOM grossly intact. Sinuses: nontender; Nose: nares patent without rhinorrhea, Throat: oropharynx clear, tonsils non erythematous or enlarged, uvula midline  Neck: supple without LAD Lungs: unlabored respirations, symmetrical air entry; cough: present; no respiratory distress; CTAB Heart: regular rate and rhythm.  Radial pulses 2+ symmetrical bilaterally Skin: warm and dry Psychological: alert and cooperative; normal mood and affect  LABS:  No results found for this or any previous visit (from the past 24 hour(s)).   ASSESSMENT & PLAN:  1. Fatigue, unspecified type   2. URI with cough and congestion   3. Encounter for screening for COVID-19     No orders of the defined types were placed in this encounter.   Discharge Instructions  COVID testing ordered.  It will take between 2-7 days for test results.  Someone will contact you regarding abnormal results.    In the meantime: You should remain isolated in your home for 10 days from symptom onset AND greater than 24 hours after symptoms resolution (absence of fever without the use of fever-reducing medication and improvement in respiratory symptoms), whichever is longer Get plenty of rest and push fluids Continue to take albuterol, prednisone and Tessalon Perles as prescribed by Pulmonologist Follow-up with PCP Use OTC medications like ibuprofen or tylenol as needed fever or pain Call or go to the ED if you have any new or worsening symptoms such as fever, worsening cough,  shortness of breath, chest tightness, chest pain, turning blue, changes in mental status, etc...   Reviewed expectations re: course of current medical issues. Questions answered. Outlined signs and symptoms indicating need for more acute intervention. Patient verbalized understanding. After Visit Summary given.         Emerson Monte, FNP 02/16/20 1548    Emerson Monte, FNP 02/16/20 1548

## 2020-02-16 NOTE — Progress Notes (Signed)
This encounter was created in error - please disregard.

## 2020-02-16 NOTE — ED Triage Notes (Signed)
Fatigue since Monday. pcp wanted pt to come here to get covid test and poss labs done.  Pt also reports she had a couple of seizures.  Pt not on any seizure medication, states her dr in [place] took her off of it and she has not seen a neurologist around here.

## 2020-02-16 NOTE — Telephone Encounter (Signed)
Return call back to see if she will go to the Urgent care to be seen & evaluated. Pt did not want to go  to ER or urgent care due to scared she may catch covid. Pt was reassured that if she went by EMS they would take her straight into the back. Then if she decides to go to the Urgent care I would pay for ride  since she [place] take [place] the [place] and back home.

## 2020-02-17 LAB — COMPREHENSIVE METABOLIC PANEL
ALT: 36 IU/L — ABNORMAL HIGH (ref 0–32)
AST: 26 IU/L (ref 0–40)
Albumin/Globulin Ratio: 1.7 (ref 1.2–2.2)
Albumin: 4.6 g/dL (ref 3.8–4.8)
Alkaline Phosphatase: 90 IU/L (ref 44–121)
BUN/Creatinine Ratio: 11 (ref 9–23)
BUN: 8 mg/dL (ref 6–24)
Bilirubin Total: 0.3 mg/dL (ref 0.0–1.2)
CO2: 23 mmol/L (ref 20–29)
Calcium: 9.2 mg/dL (ref 8.7–10.2)
Chloride: 97 mmol/L (ref 96–106)
Creatinine, Ser: 0.71 mg/dL (ref 0.57–1.00)
GFR calc Af Amer: 119 mL/min/{1.73_m2} (ref >59)
GFR calc non Af Amer: 103 mL/min/{1.73_m2} (ref >59)
Globulin, Total: 2.7 g/dL (ref 1.5–4.5)
Glucose: 105 mg/dL — ABNORMAL HIGH (ref 65–99)
Potassium: 3.3 mmol/L — ABNORMAL LOW (ref 3.5–5.2)
Sodium: 135 mmol/L (ref 134–144)
Total Protein: 7.3 g/dL (ref 6.0–8.5)

## 2020-02-17 LAB — CBC WITH DIFFERENTIAL/PLATELET
Basophils Absolute: 0.1 10*3/uL (ref 0.0–0.2)
Basos: 1 %
EOS (ABSOLUTE): 0.5 10*3/uL — ABNORMAL HIGH (ref 0.0–0.4)
Eos: 5 %
Hematocrit: 41.9 % (ref 34.0–46.6)
Hemoglobin: 12.9 g/dL (ref 11.1–15.9)
Immature Grans (Abs): 0.1 10*3/uL (ref 0.0–0.1)
Immature Granulocytes: 1 %
Lymphocytes Absolute: 2.5 10*3/uL (ref 0.7–3.1)
Lymphs: 25 %
MCH: 22.2 pg — ABNORMAL LOW (ref 26.6–33.0)
MCHC: 30.8 g/dL — ABNORMAL LOW (ref 31.5–35.7)
MCV: 72 fL — ABNORMAL LOW (ref 79–97)
Monocytes Absolute: 0.8 10*3/uL (ref 0.1–0.9)
Monocytes: 8 %
Neutrophils Absolute: 6.3 10*3/uL (ref 1.4–7.0)
Neutrophils: 60 %
Platelets: 329 10*3/uL (ref 150–450)
RBC: 5.82 x10E6/uL — ABNORMAL HIGH (ref 3.77–5.28)
RDW: 17.7 % — ABNORMAL HIGH (ref 11.7–15.4)
WBC: 10.3 10*3/uL (ref 3.4–10.8)

## 2020-02-17 LAB — TSH: TSH: 1.05 u[IU]/mL (ref 0.450–4.500)

## 2020-02-19 LAB — NOVEL CORONAVIRUS, NAA: SARS-CoV-2, NAA: NOT DETECTED

## 2020-02-20 ENCOUNTER — Encounter (INDEPENDENT_AMBULATORY_CARE_PROVIDER_SITE_OTHER): Payer: Self-pay

## 2020-02-20 ENCOUNTER — Other Ambulatory Visit: Payer: Self-pay

## 2020-02-20 ENCOUNTER — Telehealth (INDEPENDENT_AMBULATORY_CARE_PROVIDER_SITE_OTHER): Payer: Self-pay | Admitting: Nurse Practitioner

## 2020-02-20 ENCOUNTER — Telehealth (INDEPENDENT_AMBULATORY_CARE_PROVIDER_SITE_OTHER): Payer: Self-pay

## 2020-02-20 ENCOUNTER — Telehealth (INDEPENDENT_AMBULATORY_CARE_PROVIDER_SITE_OTHER): Payer: Medicaid Other | Admitting: Nurse Practitioner

## 2020-02-20 DIAGNOSIS — M5442 Lumbago with sciatica, left side: Secondary | ICD-10-CM | POA: Diagnosis not present

## 2020-02-20 DIAGNOSIS — R748 Abnormal levels of other serum enzymes: Secondary | ICD-10-CM | POA: Diagnosis not present

## 2020-02-20 DIAGNOSIS — M5441 Lumbago with sciatica, right side: Secondary | ICD-10-CM

## 2020-02-20 DIAGNOSIS — M069 Rheumatoid arthritis, unspecified: Secondary | ICD-10-CM

## 2020-02-20 DIAGNOSIS — R609 Edema, unspecified: Secondary | ICD-10-CM | POA: Diagnosis not present

## 2020-02-20 DIAGNOSIS — G8929 Other chronic pain: Secondary | ICD-10-CM

## 2020-02-20 MED ORDER — ACETAMINOPHEN 500 MG PO CHEW: 500.0000 mg | CHEWABLE_TABLET | Freq: Three times a day (TID) | ORAL | 0 refills | Status: DC | PRN

## 2020-02-20 MED ORDER — POTASSIUM CHLORIDE CRYS ER 20 MEQ PO TBCR: 20.0000 meq | EXTENDED_RELEASE_TABLET | Freq: Every day | ORAL | 1 refills | Status: DC

## 2020-02-20 MED ORDER — IBUPROFEN 800 MG PO TABS: 800.0000 mg | ORAL_TABLET | Freq: Three times a day (TID) | ORAL | 0 refills | Status: DC | PRN

## 2020-02-20 MED ORDER — HYDROXYCHLOROQUINE SULFATE 200 MG PO TABS: 200.0000 mg | ORAL_TABLET | Freq: Two times a day (BID) | ORAL | 1 refills | Status: DC

## 2020-02-20 NOTE — Telephone Encounter (Signed)
Will you look into the rheumatology, bariatric medicine, and eye doctor referrals.  The patient tells me she still has not heard back from any of these providers to get scheduled.

## 2020-02-20 NOTE — Telephone Encounter (Signed)
Just for documentation purposes, I would like to note that me and Nellie discussed this patient.  Nellie has tried to call the patient to discuss the lab results and the patient's concerns.  The patient has not answer the phone.  Nellie has sent her message in my chart and has tentatively scheduled her with Dr. Karilyn Cota later this week.  We are waiting to hear back from the patient to see if she can come to the appointment as scheduled and to make sure she is aware that we recommend she start taking her potassium tablet by mouth every day for at least the next 5 days.

## 2020-02-20 NOTE — Progress Notes (Signed)
Due to national recommendations of social distancing related to the COVID19 pandemic, an audio-only tele-health visit was felt to be the most appropriate encounter type for this patient today. I connected with  Elizabeth Bautista on 02/20/20 utilizing audio-only technology and verified that I am speaking with the correct person using two identifiers. The patient was located at their home, and I was located at the office of [facility] during the encounter. I discussed the limitations of evaluation and management by telemedicine. The patient expressed understanding and agreed to proceed.    Subjective:  Patient ID: Elizabeth Bautista, female    DOB: [birth year]  Age: 44 y.o. MRN: [id number]  CC:  Chief Complaint  Patient presents with  . Other    lab work      HPI  This patient arrives today for the above.  This patient arrives today for virtual visit for the above.  She was seen in urgent care last week because she was experiencing upper respiratory infection symptoms in addition to weakness.  She is concerned because the urgent care called her to let her know that blood work did show some abnormalities.  Per review I do see that potassium was mildly reduced at 3.3, and ALT was increased at 36.  AST was normal.  She is on hydrochlorothiazide but tells me she is not taking potassium supplement currently.  At one point she was also on furosemide and was told to take that only as needed for swelling and potassium supplement with the furosemide, however she does not recall this.  She tells me she has been in excruciating pain.  She rates it as a 10+ out of 10 it is intermittent at times but has been pretty constant here over the last week or so.  She tells me she is been having pain in her back and legs off and on for about 2 years.  She has tried and failed prednisone in the past, and she tells me she has also undergone work with physical therapy in the past prior to moving to this area.  She tells me  sometimes it feels like a burning pain.  She tells me the pain radiates down bilateral legs.  She does feel that her legs are weak.  She has not fallen and she denies any new or worsening onset of urinary or bowel incontinence.  She denies saddle paresthesias.   Past Medical History:  Diagnosis Date  . Anemia   . Asthma   . Edema   . HTN (hypertension)   . Seizures ([facility])    last seizure 2019  . Sleep apnea   . Sleep apnea       Family History  Family history unknown: Yes    Social History   Social History Narrative  . Not on file   Social History   Tobacco Use  . Smoking status: Former Smoker    Years: 10.00    Types: Cigarettes    Quit date: 06/02/2014    Years since quitting: 5.7  . Smokeless tobacco: Never Used  . Tobacco comment: during the 10 years of smoking, smoked 2-3 cigarettes/day  Substance Use Topics  . Alcohol use: Not Currently     No outpatient medications have been marked as taking for the 02/20/20 encounter (Video Visit) with Elenore Paddy, NP.    ROS:  See HPI  Objective:   Today's Vitals: LMP 02/15/2020  Vitals with BMI 02/20/2020 02/16/2020 02/09/2020  Height - 5\' 4"  5'  4"  Weight - 282 lbs 287 lbs 6 oz  BMI - 48.38 49.31  Systolic (No Data) 130 144  Diastolic (No Data) 82 92  Pulse - 80 84     Physical Exam Comprehensive physical exam not conducted today as office visit was conducted remotely.  She did sound okay over the phone, she did sound tearful at times.  She answered questions appropriately, is oriented, and appeared to have appropriate judgment.      Assessment and Plan   1. Chronic bilateral low back pain with bilateral sciatica   2. Rheumatoid arthritis, involving unspecified site, unspecified whether rheumatoid factor present (HCC)   3. Edema, unspecified type   4. Elevated liver enzymes      Plan: 1.  Sent prescription strength ibuprofen and recommend she take ibuprofen and Tylenol for the treatment of her pain.   Will order MRI to see if we can pinpoint etiology of her pain.  May need to consider physical therapy again in the future.  We will not order tramadol or gabapentin due elevated risk of seizure as she reports recent seizure at home. 2.  We will refill hydroxychloroquine she was referred to rheumatology, will ask staff to determine why she has not had an appointment made yet. 3.  Order for potassium supplement sent in to pharmacy that she can take with her HCTZ. 4.  She follows up in person later this week.  At which point we may consider checking CMP to monitor liver enzymes and potassium level.    Tests ordered Orders Placed This Encounter  Procedures  . MR Lumbar Spine Wo Contrast      Meds ordered this encounter  Medications  . hydroxychloroquine (PLAQUENIL) 200 MG tablet    Sig: Take 1 tablet (200 mg total) by mouth 2 (two) times daily.    Dispense:  60 tablet    Refill:  1    Order Specific Question:   Supervising Provider    Answer:   Lilly [place] C [[id number]]  . potassium chloride SA (KLOR-CON) 20 MEQ tablet    Sig: Take 1 tablet (20 mEq total) by mouth daily.    Dispense:  30 tablet    Refill:  1    Order Specific Question:   Supervising Provider    Answer:   Lilly C[place] C [[id number]]  . ibuprofen (ADVIL) 800 MG tablet    Sig: Take 1 tablet (800 mg total) by mouth every 8 (eight) hours as needed.    Dispense:  30 tablet    Refill:  0    Order Specific Question:   Supervising Provider    Answer:   Lilly C[place] C [[id number]]  . acetaminophen (TYLENOL) 500 MG chewable tablet    Sig: Chew 1 tablet (500 mg total) by mouth every 8 (eight) hours as needed for pain.    Dispense:  30 tablet    Refill:  0    Order Specific Question:   Supervising Provider    Answer:   Wilson Singer [[id number]]    Patient to follow-up in 3 days with Dr. Karilyn Cota for close monitoring, would recommend CMP level be checked at that time.  Total time spent on the phone with this patient today was 28  minutes.  Elenore Paddy, NP

## 2020-02-20 NOTE — Telephone Encounter (Signed)
Ok got it

## 2020-02-20 NOTE — Telephone Encounter (Signed)
Please call this patient let her know that I reviewed her blood work from her visit to urgent care.  I do see that she was negative for COVID-19.  In addition, her potassium levels slightly low.  Please tell her to take her potassium tablet by mouth every day for the next 5 days.  See if we can move her follow-up appoint with Dr. Karilyn Cota to an earlier date, I would prefer if she see Dr. Karilyn Cota at her next office visit because she does have multiple complex concerns and I do believe she needs to see Dr. Karilyn Cota to address these as opposed to myself.  Thank you.

## 2020-02-20 NOTE — Telephone Encounter (Signed)
Pt would like a call back from Maralyn Sago to see what to do about her labs. Pt stated when she  Came to [place] she did not have all this going on, sickness. Now she has high levels she seen in her mychart. Want to see what is the next plan . Pt stated she has stop the medications over the Friday & weekend  When RN called to notify.

## 2020-02-20 NOTE — Telephone Encounter (Signed)
At this point you will have to call her. Because she is not understanding me.

## 2020-02-20 NOTE — Telephone Encounter (Signed)
This is for documentation purposes only.  I did discuss the situation with Elizabeth Bautista, she tells me she will notify the patient of what needs to be completed on patient's and in order to complete referrals.

## 2020-02-21 ENCOUNTER — Ambulatory Visit (INDEPENDENT_AMBULATORY_CARE_PROVIDER_SITE_OTHER): Payer: Medicaid Other | Admitting: Internal Medicine

## 2020-02-21 ENCOUNTER — Encounter: Payer: Self-pay | Admitting: Internal Medicine

## 2020-02-21 VITALS — BP 132/90 | HR 95 | Temp 97.5°F | Ht 65.0 in | Wt 284.0 lb

## 2020-02-21 DIAGNOSIS — R05 Cough: Secondary | ICD-10-CM | POA: Diagnosis not present

## 2020-02-21 DIAGNOSIS — R058 Other specified cough: Secondary | ICD-10-CM

## 2020-02-21 DIAGNOSIS — J45901 Unspecified asthma with (acute) exacerbation: Secondary | ICD-10-CM

## 2020-02-21 MED ORDER — STIOLTO RESPIMAT 2.5-2.5 MCG/ACT IN AERS: 2.0000 | INHALATION_SPRAY | Freq: Every day | RESPIRATORY_TRACT | 0 refills | Status: DC

## 2020-02-21 NOTE — Progress Notes (Signed)
Elizabeth Bautista, female    DOB: [birth year],    MRN: [id number]   Brief patient profile:  69 yobf  Quit smoking 06/2014  with allergies as child and new onset "asthma" during difficult delivery 2001-02-27 which resulted in death of her baby and "never right since despite quit smoking in 2016 much worse since summer 2020 req freq prednisone rec then admit   Admit date: 10/18/2019 Discharge date: 10/23/2019  Brief/Interim Summary: 44 y.o.femalewith medical history significant forasthma, anemia, seizures, sleep apneapresents with 2 weeks of sob, cough and wheeze.Pt has been using Albuterol nebulizer at home every 4 hours without relief. Pt went to Urgent Care5/18/21and they sent pt to ED. Patient lives in [place], and has been in [place] visiting for the past 2 months. Is undecided if she is going to move to [place]. She reports admissions in the past for asthma exacerbations in [place]. She has never required intubation  ED Course:O2 sats 98% on room air temperature 98.2. WBC 5.8. Portable chest x-ray without acute abnormality. COVID-19 test negative. DuoNebs, hour-long albuterol nebulizer, Ativan 0.5 mg, Solu-Medrol 125 mg, magnesium, given, still with significant wheezing and increased work of breathing hence hospitalist was called for admission for asthma exacerbation.   Discharge Diagnoses:  Asthma exacerbation -Continue experiencing shortness of breath with exertion, wheezing and tachypnea. -continue CPAP hs use -Continue duonebs andBrovana -ContinueIV solumedrol>>>prednisone taper -personally reviewed CXR--no edema or consolidation -appreciate pulmonary -continue singulair -CT maxillofacial--Normal non-contrast Face CT -10/22/19--more chest tightness and sob--> -repeat CXR--no consolidation or edema -personally reviewed EKG--sinus, nonspecific TWI -D-dimer--neg -check troponins 2-->3 -given mag -10/23/19--breathing much improved, near baseline -home with  Symbicort (insurance will not pay for Dulera)  morbidobesityandsleep apnea -continue BIPAP QHS -Body mass index is 49.56 kg/m. -lifestyle modification  anxiety/Depression -No suicidal ideation or hallucination -Continue Seroquel, BuSpar, Lexapro and as needed hydroxyzine.  Rheumatoid arthritis ([place]) -continue plaquenil  GERD/GI prophylaxis -continue Pepcid.  hyperglycemia/Diabetes Mellitus type 2 -no prior hx of diabetes -positive family hx and obesity -steroids most likely contributing -A1C--6.6 -increasedlevemir to 15units -escalate to resistant SSI -d/c home with metformin -f/u PCP to adjust regimen      History of Present Illness  Feb 27, 2020  Pulmonary/ 1st office eval/ [facility] / [facility] / post hosp f/u  Chief Complaint  Patient presents with   Consult    shortness of breath, productive cough with clear phlegm  80% better prednisone taper x one week  > rx by [facility] multiple courses of pred but none x one month and worse.  At baseline same appt no new pets/ dulera x 2/ spiriva  Dyspnea:  Room to room  Cough: dry cough Sleep: worse with cpap with 4 pillows and bed is flat  SABA use:  Last used 3 h prior  rec Plan A = Automatic = Always=    Stiolto 2 puffs each am  - take two good deep slow drags  Prednisone 10 mg take 2 until 100% better then 1 daily until seen Protonix (pantoprazole 40 mg) Take 30-60 min before first meal of the day and take pepcid (famotidine) after supper and  Diet below Plan B = Backup (to supplement plan A, not to replace it) Only use your albuterol (Proair) inhaler as a rescue medication  Late add Plan C - albuterol neb if tries plan B and it doesn't work   02/21/2020  f/u ov/[place] office/Cariann Kinnamon re: dtc asthma/ 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     Also denies any obvious fluctuation of symptoms with weather or environmental changes or other aggravating or alleviating factors except as outlined above   No unusual exposure hx or h/o childhood pna/ asthma or knowledge of premature birth.  Current Allergies, Complete Past Medical History, Past Surgical History, Family History, and Social History were reviewed in [employer] record.  ROS  The following are not active complaints unless bolded Hoarseness, sore throat, dysphagia= globus, dental problems, itching, sneezing,  nasal congestion or discharge of excess mucus or purulent secretions, ear ache,   fever, chills, sweats, unintended wt loss or wt gain, classically pleuritic or exertional cp,  orthopnea pnd or arm/hand swelling  or leg swelling, presyncope, palpitations, abdominal pain, anorexia, nausea, vomiting, diarrhea  or change in bowel habits or change in bladder habits, change in stools or change in urine, dysuria, hematuria,  rash, arthralgias, visual complaints, headache, numbness, weakness or ataxia or problems with walking or coordination,  change in mood or  memory.        Current Meds  Medication Sig   acetaminophen (TYLENOL) 500 MG chewable tablet Chew 1 tablet (500 mg total) by mouth every 8 (eight) hours as needed for pain.   albuterol (PROAIR HFA) 108 (90 Base) MCG/ACT inhaler 2 puffs every 4 hours as needed only  if your can't catch your breath   blood glucose meter kit and supplies Dispense based on patient and insurance preference. Use up to four times daily as directed. (FOR ICD-10 E10.9, E11.9).   Blood Pressure Monitoring (SPHYGMOMANOMETER) MISC 1 each by Does not apply route daily.    busPIRone (BUSPAR) 7.5 MG tablet Take 7.5 mg by mouth 2 (two) times daily.   Cholecalciferol 1.25 MG (50000 UT) TABS Take 1 tablet by mouth daily.   cyclobenzaprine (FLEXERIL) 10 MG tablet Take 10 mg by mouth 3 (three) times daily as needed for muscle spasms.    dextromethorphan (DELSYM) 30 MG/5ML liquid Take 5 mLs (30 mg total) by mouth 2 (two) times daily.   escitalopram (LEXAPRO) 10 MG tablet Take 10 mg by mouth in the morning and at bedtime.    famotidine (PEPCID) 20 MG tablet Take 20 mg by mouth daily.   ferrous sulfate 325 (65 FE) MG tablet Take 325 mg by mouth 2 (two) times daily.   fluticasone (CUTIVATE) 0.005 % ointment Apply 1 application topically daily.   hydrochlorothiazide (HYDRODIURIL) 25 MG tablet Take 1 tablet (25 mg total) by mouth daily. For high blood pressure.   hydroxychloroquine (PLAQUENIL) 200 MG tablet Take 1 tablet (200 mg total) by mouth 2 (two) times daily.   hydrOXYzine (ATARAX/VISTARIL) 10 MG tablet Take 10 mg by mouth in the morning and at bedtime.   ibuprofen (ADVIL) 800 MG tablet Take 1 tablet (800 mg total) by mouth every 8 (eight) hours as needed.   montelukast (SINGULAIR) 10 MG tablet Take 10 mg by mouth at bedtime.   pantoprazole (PROTONIX) 40 MG tablet Take 1 tablet (40 mg total) by mouth daily. Take  30-60 min before first meal of the day   potassium chloride SA (KLOR-CON) 20 MEQ tablet Take 1 tablet (20 mEq total) by mouth daily.   predniSONE (DELTASONE) 10 MG tablet 2 daily until 100% better then 1 daily   predniSONE (DELTASONE) 20 MG tablet Take 2 tablets (40 mg total) by mouth daily with breakfast.   QUEtiapine (SEROQUEL) 25 MG tablet Take 25 mg by mouth 2 (two) times daily.    Tiotropium Bromide-Olodaterol (STIOLTO RESPIMAT) 2.5-2.5 MCG/ACT AERS Inhale 2 puffs into the lungs daily.                          Past Medical History:  Diagnosis Date   Anemia    Asthma    Edema    HTN (hypertension)    Seizures ([place])     last seizure 2019   Sleep apnea    Sleep apnea         Objective:    Wt Readings from Last 3 Encounters:  02/21/20 284 lb (128.8 kg)  02/16/20 282 lb (127.9 kg)  02/09/20 287 lb 6.4 oz (130.4 kg)     Vital signs reviewed - Note on arrival 02/21/2020  02 sats  97% on RA  amb pleasant amb obese bf nad   HEENT : pt wearing mask not removed for exam due to covid -19 concerns.    NECK :  without JVD/Nodes/TM/ nl carotid upstrokes bilaterally   LUNGS: no acc muscle use,  Nl contour chest which is clear to A and P bilaterally without cough on insp or exp maneuvers   CV:  RRR  no s3 or murmur or increase in P2, and no edema   ABD: obese  soft and nontender with nl inspiratory excursion in the supine position. No bruits or organomegaly appreciated, bowel sounds nl  MS:  Nl gait/ ext warm without deformities, calf tenderness, cyanosis or clubbing No obvious joint restrictions   SKIN: warm and dry without lesions    NEURO:  alert, approp, nl sensorium with  no motor or cerebellar deficits apparent.        Assessment

## 2020-02-21 NOTE — Assessment & Plan Note (Addendum)
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  FEV1 2.03 (82%)  Ratio 0.73   - 02/09/2020  Trial of stiolto and pred  - 02/21/2020  After extensive coaching inhaler device,  effectiveness =    90% with [facility] and no longer makes her cough on inspiration so rec continue stiolto 2 pffs and taper off pred  - 02/21/2020 referred to allergy and stopped singulair at pt request (convinced it's not working)  I spent extra time with pt today reviewing appropriate use of albuterol for prn use on exertion with the following points: 1) saba is for relief of sob that does not improve by walking a slower pace or resting but rather if the pt does not improve after trying this first. 2) If the pt is convinced, as many are, that saba helps recover from activity faster then it's easy to tell if this is the case by re-challenging : ie stop, take the inhaler, then p 5 minutes try the exact same activity (intensity of workload) that just caused the symptoms and see if they are substantially diminished or not after saba 3) if there is an activity that reproducibly causes the symptoms, try the saba 15 min before the activity on alternate days   If in fact the saba really does help, then fine to continue to use it prn but advised may need to look closer at the maintenance regimen being used to achieve better control of airways disease with exertion.

## 2020-02-21 NOTE — Assessment & Plan Note (Addendum)
Clinically c/w Upper airway cough syndrome (previously labeled PNDS),  is so named because it's frequently impossible to sort out how much is  CR/sinusitis with freq throat clearing (which can be related to primary GERD)   vs  causing  secondary (" extra esophageal")  GERD from wide swings in gastric pressure that occur with throat clearing, often  promoting self use of mint and menthol lozenges that reduce the lower esophageal sphincter tone and exacerbate the problem further in a cyclical fashion.   These are the same pts (now being labeled as having "irritable larynx syndrome" by some cough centers) who not infrequently have a history of having failed to tolerate ace inhibitors,  dry powder inhalers or biphosphonates or report having atypical/extraesophageal reflux symptoms that don't respond to standard doses of PPI  and are easily confused as having aecopd or asthma flares by even experienced allergists/ pulmonologists (myself included).   rec continue max rx for gerd including use of hard rock candies and referred to [facility] for allergy eval ? Continuing to pnds ?   If not better needs consideration for gabapentin next and / or referral to tertiary center eg [facility] Dr Delford Field ent dept    Pt informed of the seriousness of COVID 19 infection as a direct risk to lung health  and safey and to close contacts and should continue to wear a facemask in public and minimize exposure to public locations but especially avoid any area or activity where non-close contacts are not observing distancing or wearing an appropriate face mask.  I strongly recommended she take either of the vaccines available through local drugstores based on updated information on millions of Americans treated with the Moderna and [employer] products  which have proven both safe and  effective even against the new delta variant.           Each maintenance medication was reviewed in detail including emphasizing most importantly the  difference between maintenance and prns and under what circumstances the prns are to be triggered using an action plan format where appropriate.  Total time for H and P, chart review, counseling, teaching device and generating customized AVS unique to this office visit / charting = 33 min

## 2020-02-21 NOTE — Patient Instructions (Addendum)
I very strongly recommend you get the moderna or pfizer vaccine as soon as possible based on your risk of dying from the virus  and the proven safety and benefit of these vaccines against even the delta variant.  This can save your life as well as  those of your loved ones,  especially if they are also not vaccinated.      Only use your albuterol as a rescue medication to be used if you can't catch your breath by resting or doing a relaxed purse lip breathing pattern.  - The less you use it, the better it will work when you need it. - Ok to use up to 2 puffs  every 4 hours if you must but call for immediate appointment if use goes up over your usual need - Don't leave home without it !!  (think of it like the spare tire for your car)   Try albuterol 15 min before an activity that you know would make you short of breath and see if it makes any difference and if makes none then don't take it after activity unless you can't catch your breath.      We will be referring you to Dr Dellis Anes   Prednisone 10 mg daily x 5 daily then 5 mg dialy x 5 days and stop, if worse start over  Please schedule a follow up office visit in 4 weeks, sooner if needed  with all medications /inhalers/ solutions in hand so we can verify exactly what you are taking. This includes all medications from all doctors and over the counters

## 2020-02-22 ENCOUNTER — Telehealth (INDEPENDENT_AMBULATORY_CARE_PROVIDER_SITE_OTHER): Payer: Self-pay

## 2020-02-22 NOTE — Telephone Encounter (Signed)
[employer] ph: [phone] was called today. To start a new prior authorization for MRI Spine w/o contrast.  Pt current policy no#  is not active for insurance until 03/02/2020. Request to send notes and labs to fx:[id number]. Tracking#[id number].  Sending information now for review. Fax will be sent when pt is approved for imaging. Then appt will be scheduled.

## 2020-02-23 ENCOUNTER — Encounter (INDEPENDENT_AMBULATORY_CARE_PROVIDER_SITE_OTHER): Payer: Self-pay | Admitting: Internal Medicine

## 2020-02-23 ENCOUNTER — Other Ambulatory Visit: Payer: Self-pay

## 2020-02-23 ENCOUNTER — Ambulatory Visit (INDEPENDENT_AMBULATORY_CARE_PROVIDER_SITE_OTHER): Payer: Medicaid Other | Admitting: Internal Medicine

## 2020-02-23 ENCOUNTER — Other Ambulatory Visit (INDEPENDENT_AMBULATORY_CARE_PROVIDER_SITE_OTHER): Payer: Self-pay | Admitting: Nurse Practitioner

## 2020-02-23 VITALS — BP 145/78 | HR 89 | Temp 97.2°F | Resp 18 | Ht 65.0 in | Wt 286.4 lb

## 2020-02-23 DIAGNOSIS — E1165 Type 2 diabetes mellitus with hyperglycemia: Secondary | ICD-10-CM

## 2020-02-23 DIAGNOSIS — Z6841 Body Mass Index (BMI) 40.0 and over, adult: Secondary | ICD-10-CM

## 2020-02-23 DIAGNOSIS — I10 Essential (primary) hypertension: Secondary | ICD-10-CM | POA: Diagnosis not present

## 2020-02-23 LAB — COMPLETE METABOLIC PANEL WITH GFR
AG Ratio: 1.4 (calc) (ref 1.0–2.5)
ALT: 26 U/L (ref 6–29)
AST: 19 U/L (ref 10–35)
Albumin: 4 g/dL (ref 3.6–5.1)
Alkaline phosphatase (APISO): 79 U/L (ref 31–125)
BUN: 9 mg/dL (ref 7–25)
CO2: 26 mmol/L (ref 20–32)
Calcium: 9.4 mg/dL (ref 8.6–10.2)
Chloride: 104 mmol/L (ref 98–110)
Creat: 0.71 mg/dL (ref 0.50–1.10)
GFR, Est African American: 119 mL/min/{1.73_m2} (ref >=60)
GFR, Est Non African American: 103 mL/min/{1.73_m2} (ref >=60)
Globulin: 2.8 g/dL (calc) (ref 1.9–3.7)
Glucose, Bld: 127 mg/dL — ABNORMAL HIGH (ref 65–99)
Potassium: 3.9 mmol/L (ref 3.5–5.3)
Sodium: 140 mmol/L (ref 135–146)
Total Bilirubin: 0.3 mg/dL (ref 0.2–1.2)
Total Protein: 6.8 g/dL (ref 6.1–8.1)

## 2020-02-23 NOTE — Patient Instructions (Signed)
[facility] Dietary Recommendations for Weight Loss What to Avoid . Avoid added sugars o Often added sugar can be found in processed foods such as many condiments, dry cereals, cakes, cookies, chips, crisps, crackers, candies, sweetened drinks, etc.  o Read labels and AVOID/DECREASE use of foods with the following in their ingredient list: Sugar, fructose, high fructose corn syrup, sucrose, glucose, maltose, dextrose, molasses, cane sugar, brown sugar, any type of syrup, agave nectar, etc.   . Avoid snacking in between meals . Avoid foods made with flour o If you are going to eat food made with flour, choose those made with whole-grains; and, minimize your consumption as much as is tolerable . Avoid processed foods o These foods are generally stocked in the middle of the grocery store. Focus on shopping on the perimeter of the grocery.  . Avoid Meat  o We recommend following a plant-based diet at [facility]. Thus, we recommend avoiding meat as a general rule. Consider eating beans, legumes, eggs, and/or dairy products for regular protein sources o If you plan on eating meat limit to 4 ounces of meat at a time and choose lean options such as Fish, chicken, turkey. Avoid red meat intake such as pork and/or steak What to Include . Vegetables o GREEN LEAFY VEGETABLES: Kale, spinach, mustard greens, collard greens, cabbage, broccoli, etc. o OTHER: Asparagus, cauliflower, eggplant, carrots, peas, Brussel sprouts, tomatoes, bell peppers, zucchini, beets, cucumbers, etc. . Grains, seeds, and legumes o Beans: kidney beans, black eyed peas, garbanzo beans, black beans, pinto beans, etc. o Whole, unrefined grains: brown rice, barley, bulgur, oatmeal, etc. . Healthy fats  o Avoid highly processed fats such as vegetable oil o Examples of healthy fats: avocado, olives, virgin olive oil, dark chocolate (?72% Cocoa), nuts (peanuts, almonds, walnuts, cashews, pecans, etc.) . None to Low  Intake of Animal Sources of Protein o Meat sources: chicken, turkey, salmon, tuna. Limit to 4 ounces of meat at one time. o Consider limiting dairy sources, but when choosing dairy focus on: PLAIN Greek yogurt, cottage cheese, high-protein milk . Fruit o Choose berries  When to Eat . Intermittent Fasting: o Choosing not to eat for a specific time period, but DO FOCUS ON HYDRATION when fasting o Multiple Techniques: - Time Restricted Eating: eat 3 meals in a day, each meal lasting no more than 60 minutes, no snacks between meals - 16-18 hour fast: fast for 16 to 18 hours up to 7 days a week. Often suggested to start with 2-3 nonconsecutive days per week.  . Remember the time you sleep is counted as fasting.  . Examples of eating schedule: Fast from 7:00pm-11:00am. Eat between 11:00am-7:00pm.  - 24-hour fast: fast for 24 hours up to every other day. Often suggested to start with 1 day per week . Remember the time you sleep is counted as fasting . Examples of eating schedule:  o Eating day: eat 2-3 meals on your eating day. If doing 2 meals, each meal should last no more than 90 minutes. If doing 3 meals, each meal should last no more than 60 minutes. Finish last meal by 7:00pm. o Fasting day: Fast until 7:00pm.  o IF YOU FEEL UNWELL FOR ANY REASON/IN ANY WAY WHEN FASTING, STOP FASTING BY EATING A NUTRITIOUS SNACK OR LIGHT MEAL o ALWAYS FOCUS ON HYDRATION DURING FASTS - Acceptable Hydration sources: water, broths, tea/coffee (black tea/coffee is best but using a small amount of whole-fat dairy products in coffee/tea is acceptable).  -   Poor Hydration Sources: anything with sugar or artificial sweeteners added to it  These recommendations have been developed for patients that are actively receiving medical care from either Dr. Torell Minder or Sarah Gray, DNP, NP-C at [facility]. These recommendations are developed for patients with specific medical conditions and are not meant to be  distributed or used by others that are not actively receiving care from either provider listed above at [facility]. It is not appropriate to participate in the above eating plans without proper medical supervision.   Reference: Fung, J. The obesity code. [place]: [place]; 2016.   

## 2020-02-23 NOTE — Progress Notes (Signed)
Metrics: Intervention Frequency ACO  Documented Smoking Status Yearly  Screened one or more times in 24 months  Cessation Counseling or  Active cessation medication Past 24 months  Past 24 months   Guideline developer: UpToDate (See UpToDate for funding source) Date Released: 2014       Wellness Office Visit  Subjective:  Patient ID: Elizabeth Bautista, female    DOB: April 07, 1976  Age: 44 y.o. MRN: [id number]  CC: This lady comes in for follow-up of diabetes, obesity, hypertension and follow-up of elevated liver enzyme. HPI  She discontinued Metformin for diabetes.  Her hemoglobin A1c was 6.9%. She takes hydrochlorothiazide for hypertension and I see that her potassium level was reduced. She is due to see rheumatology for her rheumatoid arthritis and is taking Plaquenil. Past Medical History:  Diagnosis Date  . Anemia   . Asthma   . Edema   . HTN (hypertension)   . Seizures ([place])    last seizure 2019  . Sleep apnea   . Sleep apnea    Past Surgical History:  Procedure Laterality Date  . APPENDECTOMY    . CESAREAN SECTION     x3     Family History  Family history unknown: Yes    Social History   Social History Narrative   Divorced.Lives with 3 kids.Originally from [place].Came from [place] 7 months ago.   Social History   Tobacco Use  . Smoking status: Former Smoker    Years: 10.00    Types: Cigarettes    Quit date: 06/02/2014    Years since quitting: 5.7  . Smokeless tobacco: Never Used  . Tobacco comment: during the 10 years of smoking, smoked 2-3 cigarettes/day  Substance Use Topics  . Alcohol use: Not Currently    Current Meds  Medication Sig  . acetaminophen (TYLENOL) 500 MG chewable tablet Chew 1 tablet (500 mg total) by mouth every 8 (eight) hours as needed for pain.  Marland Kitchen albuterol (PROAIR HFA) 108 (90 Base) MCG/ACT inhaler 2 puffs every 4 hours as needed only  if your can't catch your breath  . blood glucose meter kit and supplies Dispense based on  patient and insurance preference. Use up to four times daily as directed. (FOR ICD-10 E10.9, E11.9).  Marland Kitchen Blood Pressure Monitoring (SPHYGMOMANOMETER) MISC 1 each by Does not apply route daily.  . busPIRone (BUSPAR) 7.5 MG tablet Take 7.5 mg by mouth 2 (two) times daily.  . Cholecalciferol 1.25 MG (50000 UT) TABS Take 1 tablet by mouth daily.  . cyclobenzaprine (FLEXERIL) 10 MG tablet Take 10 mg by mouth 3 (three) times daily as needed for muscle spasms.   Marland Kitchen dextromethorphan (DELSYM) 30 MG/5ML liquid Take 5 mLs (30 mg total) by mouth 2 (two) times daily.  Marland Kitchen escitalopram (LEXAPRO) 10 MG tablet Take 10 mg by mouth in the morning and at bedtime.   . famotidine (PEPCID) 20 MG tablet Take 20 mg by mouth daily.  . ferrous sulfate 325 (65 FE) MG tablet Take 325 mg by mouth 2 (two) times daily.  . fluticasone (CUTIVATE) 0.005 % ointment Apply 1 application topically daily.  . hydrochlorothiazide (HYDRODIURIL) 25 MG tablet Take 1 tablet (25 mg total) by mouth daily. For high blood pressure.  . hydroxychloroquine (PLAQUENIL) 200 MG tablet Take 1 tablet (200 mg total) by mouth 2 (two) times daily.  . hydrOXYzine (ATARAX/VISTARIL) 10 MG tablet Take 10 mg by mouth in the morning and at bedtime.  Marland Kitchen ibuprofen (ADVIL) 800 MG tablet Take 1 tablet (  800 mg total) by mouth every 8 (eight) hours as needed.  . montelukast (SINGULAIR) 10 MG tablet Take 10 mg by mouth at bedtime.  . pantoprazole (PROTONIX) 40 MG tablet Take 1 tablet (40 mg total) by mouth daily. Take 30-60 min before first meal of the day  . potassium chloride SA (KLOR-CON) 20 MEQ tablet Take 1 tablet (20 mEq total) by mouth daily.  . predniSONE (DELTASONE) 10 MG tablet 2 daily until 100% better then 1 daily  . QUEtiapine (SEROQUEL) 25 MG tablet Take 25 mg by mouth 2 (two) times daily.   . Tiotropium Bromide-Olodaterol (STIOLTO RESPIMAT) 2.5-2.5 MCG/ACT AERS Inhale 2 puffs into the lungs daily.      Depression screen [facility] 2/9 02/23/2020  Decreased  Interest 3  Down, Depressed, Hopeless 3  PHQ - 2 Score 6  Altered sleeping 3  Tired, decreased energy 3  Change in appetite 3  Feeling bad or failure about yourself  3  Trouble concentrating 3  Moving slowly or fidgety/restless 3  Suicidal thoughts 2  PHQ-9 Score 26  Difficult doing work/chores Extremely dIfficult     Objective:   Today's Vitals: BP (!) 145/78 (BP Location: Right Arm, Patient Position: Sitting, Cuff Size: Normal)   Pulse 89   Temp (!) 97.2 F (36.2 C) (Temporal)   Resp 18   Ht _0  (1.651 m)   Wt 286 lb 6.4 oz (129.9 kg)   LMP 02/15/2020   SpO2 98%   BMI 47.66 kg/m  Vitals with BMI 02/23/2020 02/21/2020 02/20/2020  Height _1  _2  -  Weight 286 lbs 6 oz 284 lbs -  BMI 56.43 32.95 -  Systolic 188 416 (No Data)  Diastolic 78 90 (No Data)  Pulse 89 95 -     Physical Exam  She looks systemically well but is morbidly obese.  Blood pressure is elevated today.     Assessment   1. Class 3 severe obesity with serious comorbidity and body mass index (BMI) of 45.0 to 49.9 in adult, unspecified obesity type ([place])   2. Type 2 diabetes mellitus with hyperglycemia, without long-term current use of insulin ([facility])   3. Hypertension, unspecified type       Tests ordered Orders Placed This Encounter  Procedures  . COMPLETE METABOLIC PANEL WITH GFR     Plan: 1. I had a long conversation with her regarding her obesity.  We discussed intermittent fasting and she already does at least 16 to 18 hours of fasting every day from her description of her dietary habits.  I have recommended that she fast for 24 hours every other day ensuring that she is well-hydrated and there is salt and apple cider vinegar in her water. 2. We will check electrolytes including liver enzymes today. 3. I think as far as her diabetes is concerned I am okay with her discontinuing Metformin and I think we can manage her diabetes with diet alone. 4. I will see her for close follow-up in  about 2 months time to see how she is doing. 5. Today, I also had a conversation about COVID-19 vaccination and I highly recommend that she does get vaccinated   No orders of the defined types were placed in this encounter.   Doree Albee, MD

## 2020-02-26 NOTE — Progress Notes (Addendum)
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    HPI:  Elizabeth Bautista is a 44 y.o. female here as requested by Doree Albee, MD for seizure. PMHx sleep apnea, hypertension, type 2 diabetes, rheumatoid arthritis, depression, class III severe obesity with serious comorbidity and body mass index of 45-49, asthma, seizures.  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   She sates these episodes are becoming more aggressive, they cause her pain, she is not driving. They are random. These are different than the ones in 2019 but similar to the ones in 2007 from [place]. The last one she had she was foaming, she "blacks out", she wakes up and her whole body is stiff and she she has bad headaches. 2 times she threw up. The current episode started in May of this year which are different these "new ones" that started this year but she has been having episodes for years especially since 2019 (from 2012-2019). These new episodes are different.  She has had 2 in May, June had 1, none in July, august she had 4 becoming more frequent and more severe. The one last week lasted 6 minutes. She does not remember the episodes, the last one her blood pressure wa  elevated, she just "blacked out", she was watching TV, was on the bed, (we can't call him because he is sleeping), she started "seizing"  And son explained it to patient that he noticed she leaned over, she started shaking but it look different from the "other" seizures and it seemed like she was choking, she started throwing up and he held up her head, she opened her eyes and she kept saying "what happened". She was diagnosed at [facility] in [place] in 2007 around 2008 until 2014-2015 when she moved to [place]. She has family history of cousins on mom's side. She doesn't remember what kind of seizures she was diagnosed with in May. She was allergic one medication they used and don;t remember which one they used. She will lose her urine and defecate on herself. These are similar to the seizures she had in May. And she says she knows the difference between pseudoseizures and seizures. No other focal neurologic deficits, associated symptoms, inciting events or modifiable factors.  I reviewed Dr. Lanice Shirts notes, it appears on September 13 she left a voicemail that she was in a great deal of pain due to her seizures and she had recently, total of 3, she was found on the floor by son, unknown how long she was out, and in a prior recent day her daughter heard her hit the floor when she was in the bathroom, she had to, the length was 3 minutes for the first 1 and 8 minutes for the last 1,  she called for pain and refill.  Dr. Anastasio Champion noted she was on tramadol and this is contraindicated with seizures, he would also not prescribe opioids due to this as well, patient complained that her pain was severe.  Dr. Lanice Shirts team sent her here for evaluation of these reported seizures.  She reported that she has a history of seizures but she has not been to a neurologist since she saw one in [place], I do not have those records.  She is also been seen by Jeralyn Ruths, NP who also agreed that tramadol and other opioids  would decrease the seizure threshold and when not recommended.  They recommended Tylenol.  She continued to call stating that she was in so much pain she cannot get out of bed, hurting all over, especially in the back of her legs, Dr. Irma Newness and his team have provided thorough evaluations, referred her to neurology, bariatrics, rheumatology and started evaluating her for reported pain in the back of her legs.  She also reported significant fatigue and she was sent for emergent evaluation, she was seen at [facility] urgent care center September 16, Dr. Anastasio Champion addressed her abnormal labs from urgent care: She was negative for COVID-19, her potassium levels were slightly low, ALT was increased at 36, AST was normal, and she was scheduled for follow-up office.  On September 20 and was seen in their office, she reported 10 out of 10 excruciating pain, back and legs for about 2 years, failed prednisone in the past, physical therapy, burning pain, pain radiates down the bilateral legs, weakness in the legs, and they treated her with ibuprofen and Tylenol and they ordered an MRI to pinpoint etiology of pain.  She is due to see rheumatology for her rheumatoid arthritis and she is on Plaquenil.  Reviewed notes, labs and imaging from outside physicians, which showed:  CMP with elevated glucose otherwise normal February 23 2020.  TSH normal February 16, 2020.  I reviewed MRI of the brain report from the hospital encounter in June 14, 2019:  Hemorrhage: No intracranial hemorrhage.  Brain: Gray-white matter differentiation appears appropriate. Cisterns  and sulci are intact.. There is no mass or midline shift. No restricted  diffusion to suggest acute infarction. The corpus callosum, tectum,  pituitary sella and posterior fossa are unremarkable. There is no  abnormal enhancement within the brain parenchyma.  Ventricles: No hydrocephalus.  Vasculature: There is appropriate signal void within the  central  intracranial vasculature.  Bones: Unremarkable.  Paranasal sinuses: Clear.  IMPRESSION: Unremarkable MRI ofthe brain.  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  Review of Systems: Patient complains of symptoms per HPI as well as the following symptoms:Chronic pain, ho of seizures and  pseudoseizures, back pain. Pertinent negatives and positives per HPI. All others negative.   Social History   Socioeconomic History  . Marital status: Single    Spouse name: Not on file  . Number of children: 5  . Years of education: Not on file  . Highest education level: 9th grade  Occupational History  . Not on file  Tobacco Use  . Smoking status: Former Smoker    Years: 10.00  Types: Cigarettes    Quit date: 06/02/2014    Years since quitting: 5.7  . Smokeless tobacco: Never Used  . Tobacco comment: during the 10 years of smoking, smoked 2-3 cigarettes/day  Vaping Use  . Vaping Use: Never used  Substance and Sexual Activity  . Alcohol use: Not Currently  . Drug use: Not Currently  . Sexual activity: Not on file  Other Topics Concern  . Not on file  Social History Narrative   Divorced.Lives with 3 kids.Originally from [place].Came from [place] 7 months ago.      02/27/2020   Right handed   Caffeine: none    Social Determinants of Health   Financial Resource Strain:   . Difficulty of Paying Living Expenses: Not on file  Food Insecurity:   . Worried About Charity fundraiser in the Last Year: Not on file  . Ran Out of Food in the Last Year: Not on file  Transportation Needs:   . Lack of Transportation (Medical):  Not on file  . Lack of Transportation (Non-Medical): Not on file  Physical Activity:   . Days of Exercise per Week: Not on file  . Minutes of Exercise per Session: Not on file  Stress:   . Feeling of Stress : Not on file  Social Connections:   . Frequency of Communication with Friends and Family: Not on file  . Frequency of Social Gatherings with Friends and Family: Not on file  . Attends Religious Services: Not on file  . Active Member of Clubs or Organizations: Not on file  . Attends Archivist Meetings: Not on file  . Marital Status: Not on file  Intimate Partner Violence:   . Fear of Current or Ex-Partner: Not on file  . Emotionally Abused: Not on file  . Physically Abused: Not on file  . Sexually Abused: Not on file    Family History  Problem Relation Age of Onset  . High blood pressure Mother   . Diabetes Father   . Diabetes Sister   . Heart Problems Sister   . Diabetes Brother   . Diabetes Paternal Grandmother   . Diabetes Other        father's side "everybody died from Diabetes"  . High blood pressure Other        mother's side, multiple siblings with this   . Heart attack Other        family member on mother's side   . Diabetes Paternal Aunt   . Seizures Cousin        not sibings to the other cousins with seizures  . Seizures Cousin        not sibings to the other cousins with seizures  . Seizures Cousin        not sibings to the other cousins with seizures  . Cervical cancer Maternal Aunt   . Breast cancer Cousin   . Dementia Maternal Aunt   . Asthma Maternal Aunt   . Heart Problems Maternal Grandmother   . Asthma Child   . Asthma Child     Past Medical History:  Diagnosis Date  . Anemia   . Asthma   . Diabetes ([place])    on Metformin  . Edema   . 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 HTN (hypertension)   . Rheumatoid arthritis ([place])    on Plaquenil    .  Seizures ([place])    last seizure 2019  . Sleep apnea   . Sleep apnea     Patient Active Problem List   Diagnosis Date Noted  . Upper airway cough syndrome 02/10/2020  . Swelling of lower extremity 02/01/2020  . Hypertension 02/01/2020  . Vitamin D deficiency 02/01/2020  . Type 2 diabetes mellitus with hyperglycemia, without long-term current use of insulin ([place]) 02/01/2020  . Anemia 02/01/2020  . Class 3 severe obesity with serious comorbidity and body mass index (BMI) of 45.0 to 49.9 in adult [facility]) 10/22/2019  . Asthma exacerbation 10/18/2019  . Sleep apnea 10/18/2019  . Depression 10/18/2019  . Rheumatoid arthritis ([place]) 10/18/2019    Past Surgical History:  Procedure Laterality Date  . APPENDECTOMY    . CESAREAN SECTION     x3    Current Outpatient Medications  Medication Sig Dispense Refill  . acetaminophen (TYLENOL) 500 MG chewable tablet Chew 1 tablet (500 mg total) by mouth every 8 (eight) hours as needed for pain. 30 tablet 0  . albuterol (PROAIR HFA) 108 (90 Base) MCG/ACT inhaler 2 puffs every 4 hours as needed only  if your can't catch your breath 18 g 11  . blood glucose meter kit and supplies Dispense based on patient and insurance preference. Use up to four times daily as directed. (FOR ICD-10 E10.9, E11.9). 1 each 0  . Blood Pressure Monitoring (SPHYGMOMANOMETER) MISC 1 each by Does not apply route daily. 1 each 0  . busPIRone (BUSPAR) 7.5 MG tablet Take 7.5 mg by mouth 2 (two) times daily.    . Cholecalciferol 1.25 MG (50000 UT) TABS Take 1 tablet by mouth daily. 90 tablet 0  . cyclobenzaprine (FLEXERIL) 10 MG tablet Take 10 mg by mouth 3 (three) times daily as needed for muscle spasms.     Marland Kitchen dextromethorphan (DELSYM) 30 MG/5ML liquid Take 5 mLs (30 mg total) by mouth 2 (two) times daily. 89 mL 0  . escitalopram (LEXAPRO) 10 MG tablet Take 10 mg by mouth in the morning and at bedtime.     . famotidine (PEPCID) 20 MG tablet Take 20 mg by mouth daily.    .  ferrous sulfate 325 (65 FE) MG tablet Take 325 mg by mouth 2 (two) times daily.    . fluticasone (CUTIVATE) 0.005 % ointment Apply 1 application topically daily.    . hydrochlorothiazide (HYDRODIURIL) 25 MG tablet Take 1 tablet (25 mg total) by mouth daily. For high blood pressure. 90 tablet 0  . hydroxychloroquine (PLAQUENIL) 200 MG tablet Take 1 tablet (200 mg total) by mouth 2 (two) times daily. 60 tablet 1  . hydrOXYzine (ATARAX/VISTARIL) 25 MG tablet Take 25 mg by mouth 2 (two) times daily.    Marland Kitchen ibuprofen (ADVIL) 800 MG tablet Take 1 tablet (800 mg total) by mouth every 8 (eight) hours as needed. 30 tablet 0  . METFORMIN HCL PO Take by mouth.    . pantoprazole (PROTONIX) 40 MG tablet Take 1 tablet (40 mg total) by mouth daily. Take 30-60 min before first meal of the day 30 tablet 2  . potassium chloride SA (KLOR-CON) 20 MEQ tablet Take 1 tablet (20 mEq total) by mouth daily. 30 tablet 1  . predniSONE (DELTASONE) 10 MG tablet 2 daily until 100% better then 1 daily 60 tablet 0  . QUEtiapine (SEROQUEL) 25 MG tablet Take 25 mg by mouth 2 (two) times daily.     . SYMBICORT 160-4.5 MCG/ACT inhaler Inhale 1 puff into the  lungs in the morning and at bedtime.    . Tiotropium Bromide-Olodaterol (STIOLTO RESPIMAT) 2.5-2.5 MCG/ACT AERS Inhale 2 puffs into the lungs daily. 4 g 0  . gabapentin (NEURONTIN) 100 MG capsule Take 1 capsule (100 mg total) by mouth 3 (three) times daily. 90 capsule 3   No current facility-administered medications for this visit.    Allergies as of 02/27/2020 - Review Complete 02/27/2020  Allergen Reaction Noted  . Phenytoin Anaphylaxis 10/18/2019  . Phenylbutazones  10/18/2019    Vitals: BP (!) 139/91 (BP Location: Left Arm, Patient Position: Sitting, Cuff Size: Large)   Pulse 80   Ht 5' 5"  (1.651 m)   Wt 284 lb (128.8 kg)   LMP 02/15/2020   BMI 47.26 kg/m  Last Weight:  Wt Readings from Last 1 Encounters:  02/27/20 284 lb (128.8 kg)   Last Height:   Ht Readings  from Last 1 Encounters:  02/27/20 5' 5"  (1.651 m)     Physical exam: Exam: Gen: NAD, conversant, well nourised, obese, well groomed                     CV: RRR, no MRG. No Carotid Bruits. No peripheral edema, warm, nontender Eyes: Conjunctivae clear without exudates or hemorrhage  Neuro: Detailed Neurologic Exam  Speech:    Speech is normal; fluent and spontaneous with normal comprehension.  Cognition:    The patient is oriented to person, place, and time;     recent and remote memory intact;     language fluent;     normal attention, concentration,     fund of knowledge Cranial Nerves:    The pupils are equal, round, and reactive to light. Attempted fundoscopy could not visualize due to small pupils.  Visual fields are full to finger confrontation. Extraocular movements are intact. Trigeminal sensation is intact and the muscles of mastication are normal. The face is symmetric. The palate elevates in the midline. Hearing intact. Voice is normal. Shoulder shrug is normal. The tongue has normal motion without fasciculations.   Coordination:    No dysmetria or ataxia  Gait:    Wide based due to large body habitus  Motor Observation:    No asymmetry, no atrophy, left hand postural tremor Tone:    Normal muscle tone.    Posture:    Posture is normal. normal erect    Strength: proximal left leg weakness states ot pain otherwise strength is V/V in the upper and lower limbs.      Sensation: intact to LT     Reflex Exam:  DTR's:     Deep tendon reflexes in the upper and lower extremities are Symmetrical bilaterally.   Toes:    The toes are downgoing bilaterally.   Clonus:    Clonus is absent.    Assessment/Plan:  44 y.o. female here as requested by Doree Albee, MD and Jeralyn Ruths NP for seizure. PMHx sleep apnea, hypertension, type 2 diabetes, rheumatoid arthritis, depression, class III severe obesity with serious comorbidity and body mass index of 45-49, asthma,  seizures.  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 Seizure patient often have pseudoseizures as well so she needs  a thorough evaluation.  - given she knows the difference between her seizures and pseudo-seizures, and that some patients with seizures also have nonepileptic events I would still recommend not giving her any pain or other medications that can decrease the seizure threshold like opioids. But Lyrica or Gabapentin may be good options at this time. I will reach out to Dr. Burt Ek and his team.  - Patient was evaluated for seizures "every few hours" in January 31- february2 2019 and EEG 2019 prolonged showed a normal EEG, 3 events were reported, only 1 was captured on video EEG, similar to her habitual attacks indicating that they are psychogenic nonepileptic from [facility] in [place] however I was not able to find any associated notes just the EEG.   - Need MRI of the brain since seizure-like activity started in May (had normal MRI  in January however these are new events) and routine EEG and prolonged EEG. Will try to get records from [place], sent request   In the meantime discussed: Per [facility] statutes, patients with seizures are not allowed to drive until they have been seizure-free for six months.    Use caution when using heavy equipment or power tools. Avoid working on ladders or at heights. Take showers instead of baths. Ensure the water temperature is not too high on the home water heater. Do not go swimming alone. Do not lock yourself in a room alone (i.e. bathroom). When caring for infants or small children, sit  down when holding, feeding, or changing them to minimize risk of injury to the child in the event you have a seizure. Maintain good sleep hygiene. Avoid alcohol.    If patient has another seizure, call 911 and bring them back to the ED if: A.  The seizure lasts longer than 5 minutes.      B.  The patient doesn't wake shortly after the seizure or has new problems such as difficulty seeing, speaking or moving following the seizure C.  The patient was injured during the seizure D.  The patient has a temperature over 102 F (39C) E.  The patient vomited during the seizure and now is having trouble breathing  Per [facility] statutes, patients with seizures are not allowed to drive until they have been seizure-free for six months.  Other recommendations include using caution when using heavy equipment or power tools. Avoid working on ladders or at heights. Take showers instead of baths.  Do not swim alone.  Ensure the water temperature is not too high on the home water heater. Do not go swimming alone. Do not lock yourself in a room alone (i.e. bathroom). When caring for infants or small children, sit down when holding, feeding, or changing them to minimize risk of injury to the child in the event you have a seizure. Maintain good sleep hygiene. Avoid alcohol.  Also recommend adequate sleep, hydration, good diet and minimize stress.  During the Seizure  - First, ensure adequate ventilation and place patients on the floor on their left side  Loosen clothing around the neck and ensure the airway is patent. If the patient is clenching the teeth, do not force the mouth open with any object as this can cause severe damage - Remove all items from the surrounding that can be hazardous. The patient may be oblivious to what's happening and may not even know what he or she is doing. If the patient is confused and wandering, either gently guide him/her away and  block access to outside areas - Reassure the  individual and be comforting - Call 911. In most cases, the seizure ends before EMS arrives. However, there are cases when seizures may last over 3 to 5 minutes. Or the individual may have developed breathing difficulties or severe injuries. If a pregnant patient or a person with diabetes develops a seizure, it is prudent to call an ambulance. - Finally, if the patient does not regain full consciousness, then call EMS. Most patients will remain confused for about 45 to 90 minutes after a seizure, so you must use judgment in calling for help. - Avoid restraints but make sure the patient is in a bed with padded side rails - Place the individual in a lateral position with the neck slightly flexed; this will help the saliva drain from the mouth and prevent the tongue from falling backward - Remove all nearby furniture and other hazards from the area - Provide verbal assurance as the individual is regaining consciousness - Provide the patient with privacy if possible - Call for help and start treatment as ordered by the caregiver   After the Seizure (Postictal Stage)  After a seizure, most patients experience confusion, fatigue, muscle pain and/or a headache. Thus, one should permit the individual to sleep. For the next few days, reassurance is essential. Being calm and helping reorient the person is also of importance.  Most seizures are painless and end spontaneously. Seizures are not harmful to others but can lead to complications such as stress on the lungs, brain and the heart. Individuals with prior lung problems may develop labored breathing and respiratory distress.    Orders Placed This Encounter  Procedures  . MR BRAIN W WO CONTRAST  . EEG   Meds ordered this encounter  Medications  . gabapentin (NEURONTIN) 100 MG capsule    Sig: Take 1 capsule (100 mg total) by mouth 3 (three) times daily.    Dispense:  90 capsule    Refill:  3    Cc: Doree Albee, MD,  Doree Albee,  MD  Sarina Ill, MD  [facility] [address] [place], [place] [address]  Phone [phone] Fax [phone]

## 2020-02-27 ENCOUNTER — Telehealth (INDEPENDENT_AMBULATORY_CARE_PROVIDER_SITE_OTHER): Payer: Self-pay | Admitting: Nurse Practitioner

## 2020-02-27 ENCOUNTER — Encounter: Payer: Self-pay | Admitting: Neurology

## 2020-02-27 ENCOUNTER — Other Ambulatory Visit: Payer: Self-pay

## 2020-02-27 ENCOUNTER — Ambulatory Visit: Payer: Medicaid Other | Admitting: Neurology

## 2020-02-27 ENCOUNTER — Telehealth: Payer: Self-pay | Admitting: Internal Medicine

## 2020-02-27 ENCOUNTER — Telehealth: Payer: Self-pay | Admitting: *Deleted

## 2020-02-27 ENCOUNTER — Telehealth: Payer: Self-pay | Admitting: Neurology

## 2020-02-27 VITALS — BP 139/91 | HR 80 | Ht 65.0 in | Wt 284.0 lb

## 2020-02-27 DIAGNOSIS — R569 Unspecified convulsions: Secondary | ICD-10-CM | POA: Diagnosis not present

## 2020-02-27 DIAGNOSIS — Z8669 Personal history of other diseases of the nervous system and sense organs: Secondary | ICD-10-CM | POA: Diagnosis not present

## 2020-02-27 DIAGNOSIS — F445 Conversion disorder with seizures or convulsions: Secondary | ICD-10-CM | POA: Diagnosis not present

## 2020-02-27 MED ORDER — GABAPENTIN 100 MG PO CAPS: 100.0000 mg | ORAL_CAPSULE | Freq: Three times a day (TID) | ORAL | 3 refills | Status: DC

## 2020-02-27 NOTE — Telephone Encounter (Signed)
Please review the denial for patient's recent imaging study order.  Denial states request for additional information was not received from our office.  When you have a moment please 3 submit request with office visit note from 02/20/20.  Thank you.

## 2020-02-27 NOTE — Patient Instructions (Addendum)
MRI of the brain  EEG here in the office Will contact Dr. Karilyn Cota about Gabapentin 3 -day eeg (will call you) Start Gabapentin  Gabapentin capsules or tablets What is this medicine? GABAPENTIN (GA ba pen tin) is used to control seizures in certain types of epilepsy. It is also used to treat certain types of nerve pain. This medicine may be used for other purposes; ask your health care provider or pharmacist if you have questions. COMMON BRAND NAME(S): Active-PAC with Gabapentin, Gabarone, Neurontin What should I tell my health care provider before I take this medicine? They need to know if you have any of these conditions:  history of drug abuse or alcohol abuse problem  kidney disease  lung or breathing disease  suicidal thoughts, plans, or attempt; a previous suicide attempt by you or a family member  an unusual or allergic reaction to gabapentin, other medicines, foods, dyes, or preservatives  pregnant or trying to get pregnant  breast-feeding How should I use this medicine? Take this medicine by mouth with a glass of water. Follow the directions on the prescription label. You can take it with or without food. If it upsets your stomach, take it with food. Take your medicine at regular intervals. Do not take it more often than directed. Do not stop taking except on your doctor's advice. If you are directed to break the 600 or 800 mg tablets in half as part of your dose, the extra half tablet should be used for the next dose. If you have not used the extra half tablet within 28 days, it should be thrown away. A special MedGuide will be given to you by the pharmacist with each prescription and refill. Be sure to read this information carefully each time. Talk to your pediatrician regarding the use of this medicine in children. While this drug may be prescribed for children as young as 3 years for selected conditions, precautions do apply. Overdosage: If you think you have taken too  much of this medicine contact a poison control center or emergency room at once. NOTE: This medicine is only for you. Do not share this medicine with others. What if I miss a dose? If you miss a dose, take it as soon as you can. If it is almost time for your next dose, take only that dose. Do not take double or extra doses. What may interact with this medicine? This medicine may interact with the following medications:  alcohol  antihistamines for allergy, cough, and cold  certain medicines for anxiety or sleep  certain medicines for depression like amitriptyline, fluoxetine, sertraline  certain medicines for seizures like phenobarbital, primidone  certain medicines for stomach problems  general anesthetics like halothane, isoflurane, methoxyflurane, propofol  local anesthetics like lidocaine, pramoxine, tetracaine  medicines that relax muscles for surgery  narcotic medicines for pain  phenothiazines like chlorpromazine, mesoridazine, prochlorperazine, thioridazine This list may not describe all possible interactions. Give your health care provider a list of all the medicines, herbs, non-prescription drugs, or dietary supplements you use. Also tell them if you smoke, drink alcohol, or use illegal drugs. Some items may interact with your medicine. What should I watch for while using this medicine? Visit your doctor or health care provider for regular checks on your progress. You may want to keep a record at home of how you feel your condition is responding to treatment. You may want to share this information with your doctor or health care provider at each visit. You should  contact your doctor or health care provider if your seizures get worse or if you have any new types of seizures. Do not stop taking this medicine or any of your seizure medicines unless instructed by your doctor or health care provider. Stopping your medicine suddenly can increase your seizures or their  severity. This medicine may cause serious skin reactions. They can happen weeks to months after starting the medicine. Contact your health care provider right away if you notice fevers or flu-like symptoms with a rash. The rash may be red or purple and then turn into blisters or peeling of the skin. Or, you might notice a red rash with swelling of the face, lips or lymph nodes in your neck or under your arms. Wear a medical identification bracelet or chain if you are taking this medicine for seizures, and carry a card that lists all your medications. You may get drowsy, dizzy, or have blurred vision. Do not drive, use machinery, or do anything that needs mental alertness until you know how this medicine affects you. To reduce dizzy or fainting spells, do not sit or stand up quickly, especially if you are an older patient. Alcohol can increase drowsiness and dizziness. Avoid alcoholic drinks. Your mouth may get dry. Chewing sugarless gum or sucking hard candy, and drinking plenty of water will help. The use of this medicine may increase the chance of suicidal thoughts or actions. Pay special attention to how you are responding while on this medicine. Any worsening of mood, or thoughts of suicide or dying should be reported to your health care provider right away. Women who become pregnant while using this medicine may enroll in the [place] Antiepileptic Drug Pregnancy Registry by calling [phone]. This registry collects information about the safety of antiepileptic drug use during pregnancy. What side effects may I notice from receiving this medicine? Side effects that you should report to your doctor or health care professional as soon as possible:  allergic reactions like skin rash, itching or hives, swelling of the face, lips, or tongue  breathing problems  rash, fever, and swollen lymph nodes  redness, blistering, peeling or loosening of the skin, including inside the  mouth  suicidal thoughts, mood changes Side effects that usually do not require medical attention (report to your doctor or health care professional if they continue or are bothersome):  dizziness  drowsiness  headache  nausea, vomiting  swelling of ankles, feet, hands  tiredness This list may not describe all possible side effects. Call your doctor for medical advice about side effects. You may report side effects to FDA at 1-800-FDA-1088. Where should I keep my medicine? Keep out of reach of children. This medicine may cause accidental overdose and death if it taken by other adults, children, or pets. Mix any unused medicine with a substance like cat litter or coffee grounds. Then throw the medicine away in a sealed container like a sealed bag or a coffee can with a lid. Do not use the medicine after the expiration date. Store at room temperature between 15 and 30 degrees C (59 and 86 degrees F). NOTE: This sheet is a summary. It may not cover all possible information. If you have questions about this medicine, talk to your doctor, pharmacist, or health care provider.  2020 Elsevier/Gold Standard (2018-08-20 14:16:43)   Per [facility] statutes, patients with seizures are not allowed to drive until they have been seizure-free for six months.    Use caution when using heavy  equipment or power tools. Avoid working on ladders or at heights. Take showers instead of baths. Ensure the water temperature is not too high on the home water heater. Do not go swimming alone. Do not lock yourself in a room alone (i.e. bathroom). When caring for infants or small children, sit down when holding, feeding, or changing them to minimize risk of injury to the child in the event you have a seizure. Maintain good sleep hygiene. Avoid alcohol.    If patient has another seizure, call 911 and bring them back to the ED if: A.  The seizure lasts longer than 5 minutes.      B.  The patient doesn't wake  shortly after the seizure or has new problems such as difficulty seeing, speaking or moving following the seizure C.  The patient was injured during the seizure D.  The patient has a temperature over 102 F (39C) E.  The patient vomited during the seizure and now is having trouble breathing  Per [facility] statutes, patients with seizures are not allowed to drive until they have been seizure-free for six months.  Other recommendations include using caution when using heavy equipment or power tools. Avoid working on ladders or at heights. Take showers instead of baths.  Do not swim alone.  Ensure the water temperature is not too high on the home water heater. Do not go swimming alone. Do not lock yourself in a room alone (i.e. bathroom). When caring for infants or small children, sit down when holding, feeding, or changing them to minimize risk of injury to the child in the event you have a seizure. Maintain good sleep hygiene. Avoid alcohol.  Also recommend adequate sleep, hydration, good diet and minimize stress.  During the Seizure  - First, ensure adequate ventilation and place patients on the floor on their left side  Loosen clothing around the neck and ensure the airway is patent. If the patient is clenching the teeth, do not force the mouth open with any object as this can cause severe damage - Remove all items from the surrounding that can be hazardous. The patient may be oblivious to what's happening and may not even know what he or she is doing. If the patient is confused and wandering, either gently guide him/her away and block access to outside areas - Reassure the individual and be comforting - Call 911. In most cases, the seizure ends before EMS arrives. However, there are cases when seizures may last over 3 to 5 minutes. Or the individual may have developed breathing difficulties or severe injuries. If a pregnant patient or a person with diabetes develops a seizure, it is prudent  to call an ambulance. - Finally, if the patient does not regain full consciousness, then call EMS. Most patients will remain confused for about 45 to 90 minutes after a seizure, so you must use judgment in calling for help. - Avoid restraints but make sure the patient is in a bed with padded side rails - Place the individual in a lateral position with the neck slightly flexed; this will help the saliva drain from the mouth and prevent the tongue from falling backward - Remove all nearby furniture and other hazards from the area - Provide verbal assurance as the individual is regaining consciousness - Provide the patient with privacy if possible - Call for help and start treatment as ordered by the caregiver   fter the Seizure (Postictal Stage)  After a seizure, most patients experience  confusion, fatigue, muscle pain and/or a headache. Thus, one should permit the individual to sleep. For the next few days, reassurance is essential. Being calm and helping reorient the person is also of importance.  Most seizures are painless and end spontaneously. Seizures are not harmful to others but can lead to complications such as stress on the lungs, brain and the heart. Individuals with prior lung problems may develop labored breathing and respiratory distress.    Gabapentin capsules or tablets What is this medicine? GABAPENTIN (GA ba pen tin) is used to control seizures in certain types of epilepsy. It is also used to treat certain types of nerve pain. This medicine may be used for other purposes; ask your health care provider or pharmacist if you have questions. COMMON BRAND NAME(S): Active-PAC with Gabapentin, Gabarone, Neurontin What should I tell my health care provider before I take this medicine? They need to know if you have any of these conditions:  history of drug abuse or alcohol abuse problem  kidney disease  lung or breathing disease  suicidal thoughts, plans, or attempt; a previous  suicide attempt by you or a family member  an unusual or allergic reaction to gabapentin, other medicines, foods, dyes, or preservatives  pregnant or trying to get pregnant  breast-feeding How should I use this medicine? Take this medicine by mouth with a glass of water. Follow the directions on the prescription label. You can take it with or without food. If it upsets your stomach, take it with food. Take your medicine at regular intervals. Do not take it more often than directed. Do not stop taking except on your doctor's advice. If you are directed to break the 600 or 800 mg tablets in half as part of your dose, the extra half tablet should be used for the next dose. If you have not used the extra half tablet within 28 days, it should be thrown away. A special MedGuide will be given to you by the pharmacist with each prescription and refill. Be sure to read this information carefully each time. Talk to your pediatrician regarding the use of this medicine in children. While this drug may be prescribed for children as young as 3 years for selected conditions, precautions do apply. Overdosage: If you think you have taken too much of this medicine contact a poison control center or emergency room at once. NOTE: This medicine is only for you. Do not share this medicine with others. What if I miss a dose? If you miss a dose, take it as soon as you can. If it is almost time for your next dose, take only that dose. Do not take double or extra doses. What may interact with this medicine? This medicine may interact with the following medications:  alcohol  antihistamines for allergy, cough, and cold  certain medicines for anxiety or sleep  certain medicines for depression like amitriptyline, fluoxetine, sertraline  certain medicines for seizures like phenobarbital, primidone  certain medicines for stomach problems  general anesthetics like halothane, isoflurane, methoxyflurane,  propofol  local anesthetics like lidocaine, pramoxine, tetracaine  medicines that relax muscles for surgery  narcotic medicines for pain  phenothiazines like chlorpromazine, mesoridazine, prochlorperazine, thioridazine This list may not describe all possible interactions. Give your health care provider a list of all the medicines, herbs, non-prescription drugs, or dietary supplements you use. Also tell them if you smoke, drink alcohol, or use illegal drugs. Some items may interact with your medicine. What should I watch for while  using this medicine? Visit your doctor or health care provider for regular checks on your progress. You may want to keep a record at home of how you feel your condition is responding to treatment. You may want to share this information with your doctor or health care provider at each visit. You should contact your doctor or health care provider if your seizures get worse or if you have any new types of seizures. Do not stop taking this medicine or any of your seizure medicines unless instructed by your doctor or health care provider. Stopping your medicine suddenly can increase your seizures or their severity. This medicine may cause serious skin reactions. They can happen weeks to months after starting the medicine. Contact your health care provider right away if you notice fevers or flu-like symptoms with a rash. The rash may be red or purple and then turn into blisters or peeling of the skin. Or, you might notice a red rash with swelling of the face, lips or lymph nodes in your neck or under your arms. Wear a medical identification bracelet or chain if you are taking this medicine for seizures, and carry a card that lists all your medications. You may get drowsy, dizzy, or have blurred vision. Do not drive, use machinery, or do anything that needs mental alertness until you know how this medicine affects you. To reduce dizzy or fainting spells, do not sit or stand up  quickly, especially if you are an older patient. Alcohol can increase drowsiness and dizziness. Avoid alcoholic drinks. Your mouth may get dry. Chewing sugarless gum or sucking hard candy, and drinking plenty of water will help. The use of this medicine may increase the chance of suicidal thoughts or actions. Pay special attention to how you are responding while on this medicine. Any worsening of mood, or thoughts of suicide or dying should be reported to your health care provider right away. Women who become pregnant while using this medicine may enroll in the [place] Antiepileptic Drug Pregnancy Registry by calling [phone]. This registry collects information about the safety of antiepileptic drug use during pregnancy. What side effects may I notice from receiving this medicine? Side effects that you should report to your doctor or health care professional as soon as possible:  allergic reactions like skin rash, itching or hives, swelling of the face, lips, or tongue  breathing problems  rash, fever, and swollen lymph nodes  redness, blistering, peeling or loosening of the skin, including inside the mouth  suicidal thoughts, mood changes Side effects that usually do not require medical attention (report to your doctor or health care professional if they continue or are bothersome):  dizziness  drowsiness  headache  nausea, vomiting  swelling of ankles, feet, hands  tiredness This list may not describe all possible side effects. Call your doctor for medical advice about side effects. You may report side effects to FDA at 1-800-FDA-1088. Where should I keep my medicine? Keep out of reach of children. This medicine may cause accidental overdose and death if it taken by other adults, children, or pets. Mix any unused medicine with a substance like cat litter or coffee grounds. Then throw the medicine away in a sealed container like a sealed bag or a coffee can with a lid.  Do not use the medicine after the expiration date. Store at room temperature between 15 and 30 degrees C (59 and 86 degrees F). NOTE: This sheet is a summary. It may not cover all possible  information. If you have questions about this medicine, talk to your doctor, pharmacist, or health care provider.  2020 Elsevier/Gold Standard (2018-08-20 14:16:43)

## 2020-02-27 NOTE — Telephone Encounter (Signed)
Request faxed to [facility] [phone] phone fax number [phone]

## 2020-02-27 NOTE — Addendum Note (Signed)
Addended by: Naomie Dean B on: 02/27/2020 05:11 PM   Modules accepted: Orders

## 2020-02-27 NOTE — Telephone Encounter (Signed)
[facility] pending sight location for GI.

## 2020-02-27 NOTE — Telephone Encounter (Signed)
Patient has OSA, her CPAP machine is not working. She is requesting all her sleep records from [employer]. She is asking if we can order a new machine or will she have to have a sleep consult prior. Please advise.

## 2020-02-28 ENCOUNTER — Encounter (INDEPENDENT_AMBULATORY_CARE_PROVIDER_SITE_OTHER): Payer: Self-pay | Admitting: Nurse Practitioner

## 2020-02-28 ENCOUNTER — Telehealth: Payer: Self-pay | Admitting: *Deleted

## 2020-02-28 NOTE — Telephone Encounter (Signed)
Appears she has Medicaid for her insurance.  She would need to have an in lab split night sleep study to determine if she still has obstructive sleep apnea.  Assuming the study shows she still has OSA, then she can get set up with a new DME in [place] and get new CPAP machine and supplies.

## 2020-02-28 NOTE — Telephone Encounter (Signed)
I have her records from [place] on my desk in [place] but I don't do sleep medicine - will ask Dr Craige Cotta today since he goes to [place] to see if he can help set her up and do f/u

## 2020-02-28 NOTE — Telephone Encounter (Signed)
ATC patient unable to reach LM to call back office (x1)  

## 2020-02-28 NOTE — Telephone Encounter (Signed)
I did not see a recent sleep study so Dr Craige Cotta rec  She would need to have an in lab split night sleep study to determine if she still has obstructive sleep apnea.  Assuming the study shows she still has OSA, then she can get set up with a new DME in [place] and get new CPAP machine and supplies.  Please order above per Dr Craige Cotta with f/u Craige Cotta in [place] office

## 2020-02-28 NOTE — Telephone Encounter (Signed)
Received a fax from [facility] stating referral had been received and is in process. We will be notified when the patient has been successfully scheduled.

## 2020-02-28 NOTE — Telephone Encounter (Signed)
72 hour AMB EEG referral form completed. Form signed by Dr Lucia Gaskins. Referral form and all supporting documents (past routine EEG, office visit, insurance/demographic information) faxed to [employer]. Received a receipt of confirmation.

## 2020-02-29 ENCOUNTER — Telehealth (INDEPENDENT_AMBULATORY_CARE_PROVIDER_SITE_OTHER): Payer: Self-pay

## 2020-02-29 ENCOUNTER — Encounter: Payer: Self-pay | Admitting: *Deleted

## 2020-02-29 NOTE — Telephone Encounter (Signed)
Attempted to call pt but unable to reach. Left message for her to return call. Due to multiple attempts trying to reach pt and unable to do so, letter will be mailed to pt and encounter will be closed. 

## 2020-02-29 NOTE — Telephone Encounter (Signed)
I checked the status on [facility] it is still pending.

## 2020-03-01 ENCOUNTER — Telehealth: Payer: Self-pay | Admitting: Internal Medicine

## 2020-03-01 ENCOUNTER — Telehealth: Payer: Self-pay | Admitting: Neurology

## 2020-03-01 DIAGNOSIS — G4730 Sleep apnea, unspecified: Secondary | ICD-10-CM

## 2020-03-01 NOTE — Telephone Encounter (Signed)
*  Elizabeth Bautista

## 2020-03-01 NOTE — Telephone Encounter (Signed)
A sleep study has not been ordered for this patient.  °

## 2020-03-01 NOTE — Telephone Encounter (Signed)
Called and spoke with patient to let her know that order was placed for sleep study so that she is able to get established with local DME to get new CPAP machine. Patient expressed understanding. Nothing further needed at this time.

## 2020-03-01 NOTE — Telephone Encounter (Signed)
I reached out to Elizabeth Bautista, the rep with [facility], and LVM asking if this is a call back that we should make or if his team will be handing this.

## 2020-03-01 NOTE — Telephone Encounter (Signed)
[facility] rep has called asking for a peer to peer a request for an At home EEG monitoring for 12-26 hours.  [facility] rep is asking when calling back to [phone] please ref. Member [id number]

## 2020-03-04 ENCOUNTER — Other Ambulatory Visit (INDEPENDENT_AMBULATORY_CARE_PROVIDER_SITE_OTHER): Payer: Self-pay | Admitting: Nurse Practitioner

## 2020-03-04 DIAGNOSIS — R609 Edema, unspecified: Secondary | ICD-10-CM

## 2020-03-05 NOTE — Telephone Encounter (Signed)
I called the Elizabeth Bautista again w/ [facility] and LVM asking for call back to discuss patient's EEG.

## 2020-03-06 ENCOUNTER — Other Ambulatory Visit: Payer: Medicaid Other

## 2020-03-07 ENCOUNTER — Encounter (INDEPENDENT_AMBULATORY_CARE_PROVIDER_SITE_OTHER): Payer: Self-pay | Admitting: Internal Medicine

## 2020-03-07 ENCOUNTER — Emergency Department (HOSPITAL_COMMUNITY): Payer: Medicaid Other

## 2020-03-07 ENCOUNTER — Encounter (HOSPITAL_COMMUNITY): Payer: Self-pay

## 2020-03-07 ENCOUNTER — Other Ambulatory Visit: Payer: Self-pay

## 2020-03-07 ENCOUNTER — Telehealth (INDEPENDENT_AMBULATORY_CARE_PROVIDER_SITE_OTHER): Payer: Medicaid Other | Admitting: Internal Medicine

## 2020-03-07 ENCOUNTER — Inpatient Hospital Stay (HOSPITAL_COMMUNITY)
Admission: EM | Admit: 2020-03-07 | Discharge: 2020-03-14 | DRG: 202 | Disposition: A | Discharge status: Home / Self Care | Payer: Medicaid Other | Attending: Internal Medicine | Admitting: Internal Medicine

## 2020-03-07 ENCOUNTER — Encounter (INDEPENDENT_AMBULATORY_CARE_PROVIDER_SITE_OTHER): Payer: Self-pay | Admitting: Nurse Practitioner

## 2020-03-07 DIAGNOSIS — R0603 Acute respiratory distress: Secondary | ICD-10-CM | POA: Diagnosis not present

## 2020-03-07 DIAGNOSIS — M549 Dorsalgia, unspecified: Secondary | ICD-10-CM | POA: Diagnosis present

## 2020-03-07 DIAGNOSIS — Z79899 Other long term (current) drug therapy: Secondary | ICD-10-CM

## 2020-03-07 DIAGNOSIS — G4730 Sleep apnea, unspecified: Secondary | ICD-10-CM

## 2020-03-07 DIAGNOSIS — T380X5A Adverse effect of glucocorticoids and synthetic analogues, initial encounter: Secondary | ICD-10-CM | POA: Diagnosis not present

## 2020-03-07 DIAGNOSIS — Z833 Family history of diabetes mellitus: Secondary | ICD-10-CM

## 2020-03-07 DIAGNOSIS — R058 Other specified cough: Secondary | ICD-10-CM | POA: Diagnosis present

## 2020-03-07 DIAGNOSIS — E871 Hypo-osmolality and hyponatremia: Secondary | ICD-10-CM | POA: Diagnosis present

## 2020-03-07 DIAGNOSIS — Z6841 Body Mass Index (BMI) 40.0 and over, adult: Secondary | ICD-10-CM

## 2020-03-07 DIAGNOSIS — Z7952 Long term (current) use of systemic steroids: Secondary | ICD-10-CM

## 2020-03-07 DIAGNOSIS — E876 Hypokalemia: Secondary | ICD-10-CM | POA: Diagnosis present

## 2020-03-07 DIAGNOSIS — Y92239 Unspecified place in hospital as the place of occurrence of the external cause: Secondary | ICD-10-CM | POA: Diagnosis not present

## 2020-03-07 DIAGNOSIS — J4550 Severe persistent asthma, uncomplicated: Secondary | ICD-10-CM | POA: Diagnosis present

## 2020-03-07 DIAGNOSIS — K219 Gastro-esophageal reflux disease without esophagitis: Secondary | ICD-10-CM

## 2020-03-07 DIAGNOSIS — Z20822 Contact with and (suspected) exposure to covid-19: Secondary | ICD-10-CM | POA: Diagnosis present

## 2020-03-07 DIAGNOSIS — Z87898 Personal history of other specified conditions: Secondary | ICD-10-CM

## 2020-03-07 DIAGNOSIS — Z888 Allergy status to other drugs, medicaments and biological substances status: Secondary | ICD-10-CM

## 2020-03-07 DIAGNOSIS — D649 Anemia, unspecified: Secondary | ICD-10-CM | POA: Diagnosis not present

## 2020-03-07 DIAGNOSIS — Z9989 Dependence on other enabling machines and devices: Secondary | ICD-10-CM | POA: Diagnosis present

## 2020-03-07 DIAGNOSIS — Z87891 Personal history of nicotine dependence: Secondary | ICD-10-CM

## 2020-03-07 DIAGNOSIS — J4521 Mild intermittent asthma with (acute) exacerbation: Secondary | ICD-10-CM | POA: Diagnosis not present

## 2020-03-07 DIAGNOSIS — F419 Anxiety disorder, unspecified: Secondary | ICD-10-CM | POA: Diagnosis present

## 2020-03-07 DIAGNOSIS — I10 Essential (primary) hypertension: Secondary | ICD-10-CM | POA: Diagnosis present

## 2020-03-07 DIAGNOSIS — E1165 Type 2 diabetes mellitus with hyperglycemia: Secondary | ICD-10-CM | POA: Diagnosis not present

## 2020-03-07 DIAGNOSIS — G4733 Obstructive sleep apnea (adult) (pediatric): Secondary | ICD-10-CM | POA: Diagnosis present

## 2020-03-07 DIAGNOSIS — Z825 Family history of asthma and other chronic lower respiratory diseases: Secondary | ICD-10-CM

## 2020-03-07 DIAGNOSIS — D509 Iron deficiency anemia, unspecified: Secondary | ICD-10-CM | POA: Diagnosis present

## 2020-03-07 DIAGNOSIS — R251 Tremor, unspecified: Secondary | ICD-10-CM | POA: Diagnosis not present

## 2020-03-07 DIAGNOSIS — Z803 Family history of malignant neoplasm of breast: Secondary | ICD-10-CM

## 2020-03-07 DIAGNOSIS — E8809 Other disorders of plasma-protein metabolism, not elsewhere classified: Secondary | ICD-10-CM | POA: Diagnosis present

## 2020-03-07 DIAGNOSIS — G40909 Epilepsy, unspecified, not intractable, without status epilepticus: Secondary | ICD-10-CM | POA: Diagnosis not present

## 2020-03-07 DIAGNOSIS — F32A Depression, unspecified: Secondary | ICD-10-CM | POA: Diagnosis present

## 2020-03-07 DIAGNOSIS — J45901 Unspecified asthma with (acute) exacerbation: Secondary | ICD-10-CM | POA: Diagnosis present

## 2020-03-07 DIAGNOSIS — J4540 Moderate persistent asthma, uncomplicated: Secondary | ICD-10-CM | POA: Diagnosis present

## 2020-03-07 DIAGNOSIS — M069 Rheumatoid arthritis, unspecified: Secondary | ICD-10-CM | POA: Diagnosis present

## 2020-03-07 DIAGNOSIS — Z7951 Long term (current) use of inhaled steroids: Secondary | ICD-10-CM

## 2020-03-07 DIAGNOSIS — Z7984 Long term (current) use of oral hypoglycemic drugs: Secondary | ICD-10-CM

## 2020-03-07 DIAGNOSIS — Z8049 Family history of malignant neoplasm of other genital organs: Secondary | ICD-10-CM

## 2020-03-07 DIAGNOSIS — J4551 Severe persistent asthma with (acute) exacerbation: Secondary | ICD-10-CM

## 2020-03-07 DIAGNOSIS — Z8249 Family history of ischemic heart disease and other diseases of the circulatory system: Secondary | ICD-10-CM

## 2020-03-07 DIAGNOSIS — G8929 Other chronic pain: Secondary | ICD-10-CM | POA: Diagnosis present

## 2020-03-07 DIAGNOSIS — M059 Rheumatoid arthritis with rheumatoid factor, unspecified: Secondary | ICD-10-CM | POA: Diagnosis present

## 2020-03-07 LAB — CBC WITH DIFFERENTIAL/PLATELET
Abs Immature Granulocytes: 0.04 10*3/uL (ref 0.00–0.07)
Basophils Absolute: 0 10*3/uL (ref 0.0–0.1)
Basophils Relative: 0 %
Eosinophils Absolute: 0.6 10*3/uL — ABNORMAL HIGH (ref 0.0–0.5)
Eosinophils Relative: 6 %
HCT: 35.2 % — ABNORMAL LOW (ref 36.0–46.0)
Hemoglobin: 10.8 g/dL — ABNORMAL LOW (ref 12.0–15.0)
Immature Granulocytes: 0 %
Lymphocytes Relative: 19 %
Lymphs Abs: 1.8 10*3/uL (ref 0.7–4.0)
MCH: 22 pg — ABNORMAL LOW (ref 26.0–34.0)
MCHC: 30.7 g/dL (ref 30.0–36.0)
MCV: 71.7 fL — ABNORMAL LOW (ref 80.0–100.0)
Monocytes Absolute: 0.7 10*3/uL (ref 0.1–1.0)
Monocytes Relative: 7 %
Neutro Abs: 6.6 10*3/uL (ref 1.7–7.7)
Neutrophils Relative %: 68 %
Platelets: 284 10*3/uL (ref 150–400)
RBC: 4.91 MIL/uL (ref 3.87–5.11)
RDW: 17.2 % — ABNORMAL HIGH (ref 11.5–15.5)
WBC: 9.7 10*3/uL (ref 4.0–10.5)
nRBC: 0 % (ref 0.0–0.2)

## 2020-03-07 LAB — COMPREHENSIVE METABOLIC PANEL
ALT: 18 U/L (ref 0–44)
AST: 16 U/L (ref 15–41)
Albumin: 3.4 g/dL — ABNORMAL LOW (ref 3.5–5.0)
Alkaline Phosphatase: 75 U/L (ref 38–126)
Anion gap: 9 (ref 5–15)
BUN: 8 mg/dL (ref 6–20)
CO2: 25 mmol/L (ref 22–32)
Calcium: 8.7 mg/dL — ABNORMAL LOW (ref 8.9–10.3)
Chloride: 100 mmol/L (ref 98–111)
Creatinine, Ser: 0.71 mg/dL (ref 0.44–1.00)
GFR calc non Af Amer: >60 mL/min (ref >60)
Glucose, Bld: 109 mg/dL — ABNORMAL HIGH (ref 70–99)
Potassium: 3.3 mmol/L — ABNORMAL LOW (ref 3.5–5.1)
Sodium: 134 mmol/L — ABNORMAL LOW (ref 135–145)
Total Bilirubin: 0.4 mg/dL (ref 0.3–1.2)
Total Protein: 7.3 g/dL (ref 6.5–8.1)

## 2020-03-07 LAB — RESP PANEL BY RT PCR (RSV, FLU A&B, COVID)
Influenza A by PCR: NEGATIVE
Influenza B by PCR: NEGATIVE
Respiratory Syncytial Virus by PCR: NEGATIVE
SARS Coronavirus 2 by RT PCR: NEGATIVE

## 2020-03-07 LAB — HCG, QUANTITATIVE, PREGNANCY: hCG, Beta Chain, Quant, S: <1 m[IU]/mL (ref <5)

## 2020-03-07 IMAGING — DX DG CHEST 1V PORT
1 series · 1 of 1 positions shown · non-contrast
Comparison: [DATE]

CLINICAL DATA: Cough, chest pain.

EXAM:
PORTABLE CHEST 1 VIEW

[chest ap]
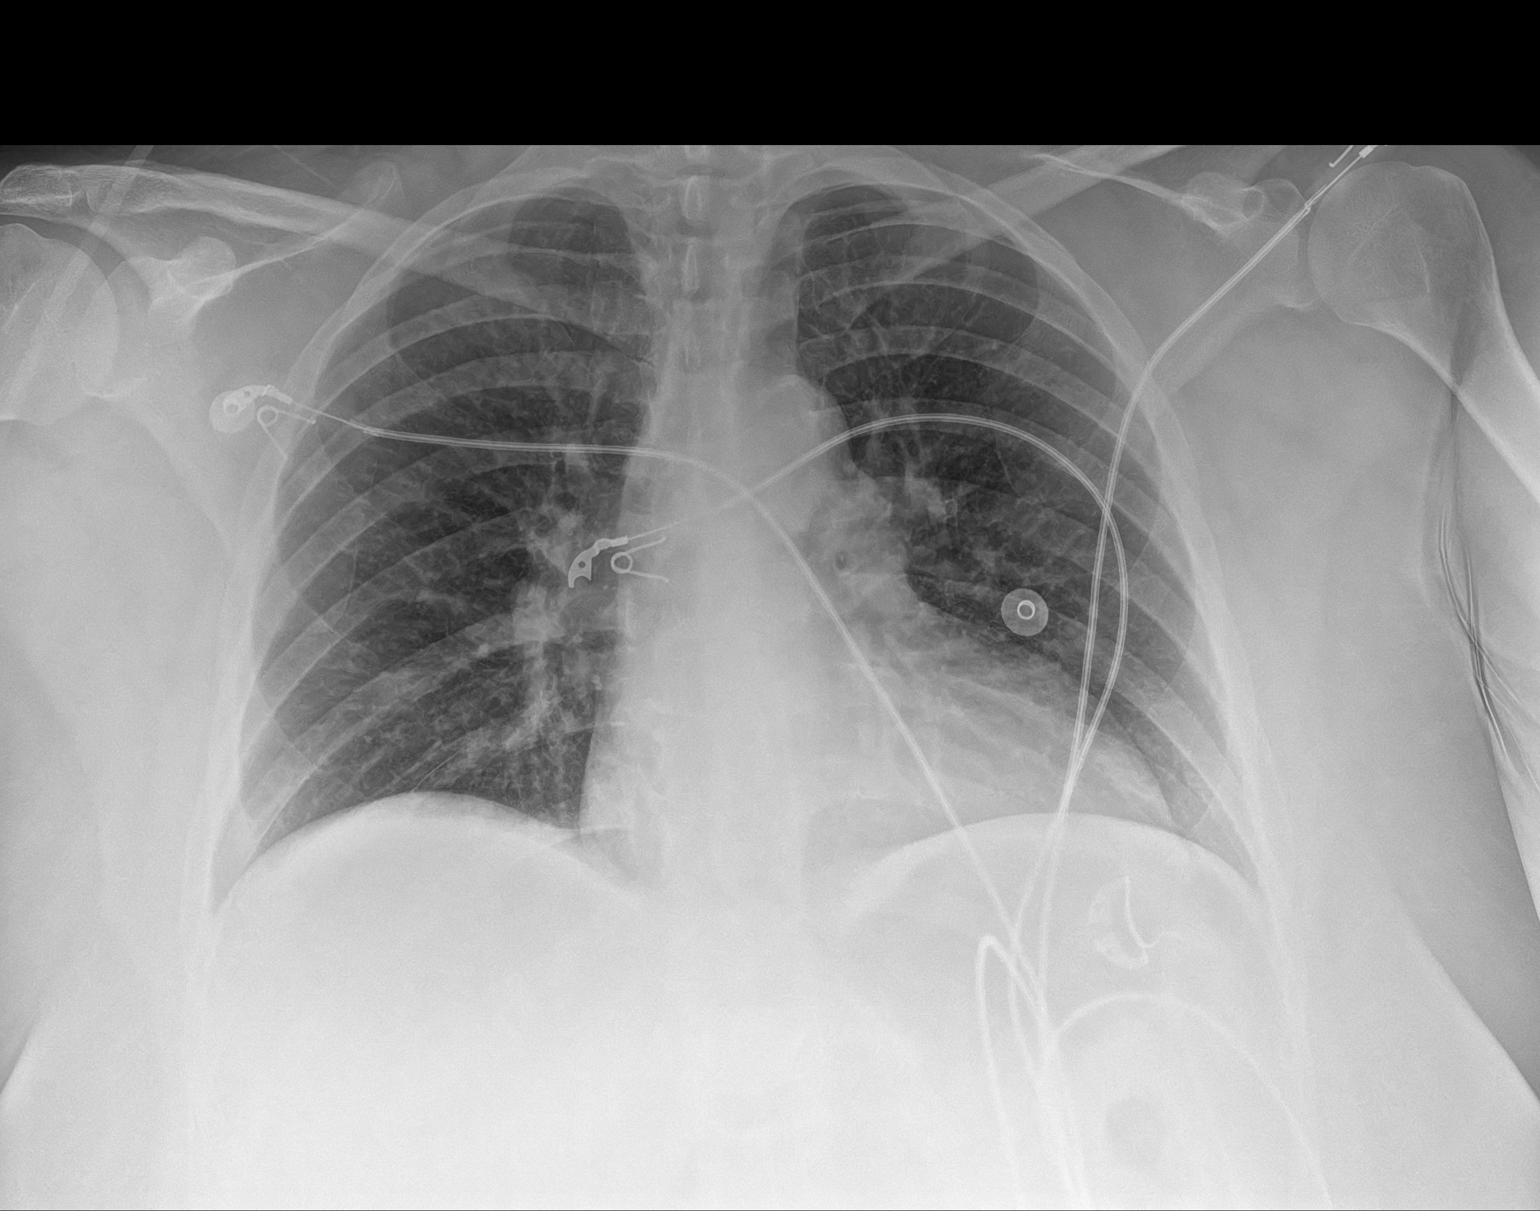

[1 of 1 positions shown; findings below may reference images not displayed]

FINDINGS: The heart size and mediastinal contours are within normal limits. No
consolidation. No visible pleural effusions or pneumothorax. Low
lung volumes. The visualized skeletal structures are unremarkable.
IMPRESSION: No active disease.

## 2020-03-07 MED ORDER — AZITHROMYCIN 250 MG PO TABS
250.0000 mg | ORAL_TABLET | Freq: Every day | ORAL | Status: AC
  Administered 2020-03-08 – 2020-03-11 (×4): 250 mg via ORAL
  Filled 2020-03-07 (×5): qty 1

## 2020-03-07 MED ORDER — ENOXAPARIN SODIUM 40 MG/0.4ML ~~LOC~~ SOLN
40.0000 mg | SUBCUTANEOUS | Status: DC
  Administered 2020-03-07 – 2020-03-13 (×7): 40 mg via SUBCUTANEOUS
  Filled 2020-03-07 (×7): qty 0.4

## 2020-03-07 MED ORDER — ALBUTEROL (5 MG/ML) CONTINUOUS INHALATION SOLN
10.0000 mg/h | INHALATION_SOLUTION | Freq: Once | RESPIRATORY_TRACT | Status: AC
  Administered 2020-03-07: 10 mg/h via RESPIRATORY_TRACT
  Filled 2020-03-07: qty 20

## 2020-03-07 MED ORDER — HYDROCOD POLST-CPM POLST ER 10-8 MG/5ML PO SUER
5.0000 mL | Freq: Once | ORAL | Status: AC
  Administered 2020-03-07: 5 mL via ORAL
  Filled 2020-03-07: qty 5

## 2020-03-07 MED ORDER — ESCITALOPRAM OXALATE 10 MG PO TABS
10.0000 mg | ORAL_TABLET | Freq: Every day | ORAL | Status: DC
  Administered 2020-03-08 – 2020-03-14 (×7): 10 mg via ORAL
  Filled 2020-03-07 (×7): qty 1

## 2020-03-07 MED ORDER — METHYLPREDNISOLONE SODIUM SUCC 125 MG IJ SOLR
125.0000 mg | Freq: Once | INTRAMUSCULAR | Status: AC
  Administered 2020-03-07: 125 mg via INTRAVENOUS
  Filled 2020-03-07: qty 2

## 2020-03-07 MED ORDER — HYDROXYCHLOROQUINE SULFATE 200 MG PO TABS
200.0000 mg | ORAL_TABLET | Freq: Two times a day (BID) | ORAL | Status: DC
  Administered 2020-03-07 – 2020-03-14 (×14): 200 mg via ORAL
  Filled 2020-03-07 (×15): qty 1

## 2020-03-07 MED ORDER — POTASSIUM CHLORIDE CRYS ER 20 MEQ PO TBCR
40.0000 meq | EXTENDED_RELEASE_TABLET | Freq: Once | ORAL | Status: AC
  Administered 2020-03-07: 40 meq via ORAL
  Filled 2020-03-07 (×2): qty 2

## 2020-03-07 MED ORDER — HYDROCOD POLST-CPM POLST ER 10-8 MG/5ML PO SUER
5.0000 mL | Freq: Two times a day (BID) | ORAL | Status: DC | PRN
  Administered 2020-03-07 – 2020-03-08 (×2): 5 mL via ORAL
  Filled 2020-03-07 (×2): qty 5

## 2020-03-07 MED ORDER — MAGNESIUM SULFATE 2 GM/50ML IV SOLN
2.0000 g | Freq: Once | INTRAVENOUS | Status: AC
  Administered 2020-03-07: 2 g via INTRAVENOUS
  Filled 2020-03-07: qty 50

## 2020-03-07 MED ORDER — METHYLPREDNISOLONE SODIUM SUCC 40 MG IJ SOLR
40.0000 mg | Freq: Two times a day (BID) | INTRAMUSCULAR | Status: DC
  Administered 2020-03-08: 40 mg via INTRAVENOUS
  Filled 2020-03-07: qty 1

## 2020-03-07 MED ORDER — AZITHROMYCIN 250 MG PO TABS
500.0000 mg | ORAL_TABLET | Freq: Every day | ORAL | Status: AC
  Administered 2020-03-07: 500 mg via ORAL
  Filled 2020-03-07: qty 2

## 2020-03-07 MED ORDER — IPRATROPIUM-ALBUTEROL 0.5-2.5 (3) MG/3ML IN SOLN
3.0000 mL | RESPIRATORY_TRACT | Status: DC | PRN
  Filled 2020-03-07: qty 3

## 2020-03-07 MED ORDER — MOMETASONE FURO-FORMOTEROL FUM 200-5 MCG/ACT IN AERO
2.0000 | INHALATION_SPRAY | Freq: Two times a day (BID) | RESPIRATORY_TRACT | Status: DC
  Administered 2020-03-07 – 2020-03-08 (×2): 2 via RESPIRATORY_TRACT

## 2020-03-07 MED ORDER — BUSPIRONE HCL 5 MG PO TABS
7.5000 mg | ORAL_TABLET | Freq: Two times a day (BID) | ORAL | Status: DC
  Administered 2020-03-07 – 2020-03-14 (×14): 7.5 mg via ORAL
  Filled 2020-03-07 (×14): qty 2

## 2020-03-07 MED ORDER — ALBUTEROL SULFATE HFA 108 (90 BASE) MCG/ACT IN AERS
6.0000 | INHALATION_SPRAY | Freq: Once | RESPIRATORY_TRACT | Status: DC
  Filled 2020-03-07: qty 6.7

## 2020-03-07 MED ORDER — IPRATROPIUM-ALBUTEROL 0.5-2.5 (3) MG/3ML IN SOLN
3.0000 mL | Freq: Once | RESPIRATORY_TRACT | Status: AC
  Administered 2020-03-07: 3 mL via RESPIRATORY_TRACT
  Filled 2020-03-07: qty 3

## 2020-03-07 MED ORDER — PANTOPRAZOLE SODIUM 40 MG PO TBEC: 40.0000 mg | DELAYED_RELEASE_TABLET | Freq: Every day | ORAL | Status: DC

## 2020-03-07 MED ORDER — HYDROCHLOROTHIAZIDE 25 MG PO TABS
25.0000 mg | ORAL_TABLET | Freq: Every day | ORAL | Status: DC
  Administered 2020-03-08 – 2020-03-14 (×7): 25 mg via ORAL
  Filled 2020-03-07 (×7): qty 1

## 2020-03-07 MED ORDER — FERROUS SULFATE 325 (65 FE) MG PO TABS
325.0000 mg | ORAL_TABLET | Freq: Two times a day (BID) | ORAL | Status: DC
  Administered 2020-03-07 – 2020-03-14 (×14): 325 mg via ORAL
  Filled 2020-03-07 (×15): qty 1

## 2020-03-07 MED ORDER — ONDANSETRON HCL 4 MG/2ML IJ SOLN
4.0000 mg | Freq: Once | INTRAMUSCULAR | Status: AC
  Administered 2020-03-07: 4 mg via INTRAVENOUS
  Filled 2020-03-07: qty 2

## 2020-03-07 MED ORDER — DEXTROMETHORPHAN POLISTIREX ER 30 MG/5ML PO SUER
30.0000 mg | Freq: Two times a day (BID) | ORAL | Status: DC
  Administered 2020-03-08: 30 mg via ORAL
  Filled 2020-03-07 (×4): qty 5

## 2020-03-07 MED ORDER — IPRATROPIUM-ALBUTEROL 0.5-2.5 (3) MG/3ML IN SOLN
3.0000 mL | Freq: Four times a day (QID) | RESPIRATORY_TRACT | Status: DC
  Administered 2020-03-07 – 2020-03-08 (×4): 3 mL via RESPIRATORY_TRACT
  Filled 2020-03-07 (×3): qty 3

## 2020-03-07 NOTE — Progress Notes (Signed)
Metrics: Intervention Frequency ACO  Documented Smoking Status Yearly  Screened one or more times in 24 months  Cessation Counseling or  Active cessation medication Past 24 months  Past 24 months   Guideline developer: UpToDate (See UpToDate for funding source) Date Released: 2014       Wellness Office Visit  Subjective:  Patient ID: Elizabeth Bautista, female    DOB: 08-09-1975  Age: 44 y.o. MRN: [id number]  CC: This is an audio telemedicine visit with the permission of the patient who is at home and I am in my office.  I used 2 identifiers to identify the patient. Dyspnea. HPI  The patient gives a 2-week history of dyspnea, rhinorrhea.  She feels very fatigued.  She had gone to the emergency room about 3 weeks ago.  And Covid test was negative. Since that time, she has gotten worse apparently with increasing shortness of breath, significant fatigue and generally feeling unwell. Past Medical History:  Diagnosis Date  . Anemia   . Asthma   . Diabetes ([place])    on Metformin  . Edema   . 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 HTN (hypertension)   . Rheumatoid arthritis ([place])    on Plaquenil   . Seizures ([place])    last seizure 2019  . Sleep apnea   . Sleep apnea    Past Surgical History:  Procedure Laterality Date  . APPENDECTOMY    . CESAREAN SECTION     x3     Family History  Problem Relation Age of Onset  . High blood pressure Mother   . Diabetes Father   . Diabetes Sister   . Heart Problems Sister   . Diabetes Brother   . Diabetes Paternal Grandmother   . Diabetes Other        father's side "everybody died from Diabetes"  . High blood pressure Other        mother's side, multiple siblings with this   . Heart attack Other        family member on mother's side   . Diabetes Paternal Aunt   . Seizures Cousin        not sibings to the other cousins with seizures  . Seizures Cousin          not sibings to the other cousins with seizures  . Seizures Cousin        not sibings to the other cousins with seizures  . Cervical cancer Maternal Aunt   . Breast cancer Cousin   . Dementia Maternal Aunt   . Asthma Maternal Aunt   . Heart Problems Maternal Grandmother   . Asthma Child   . Asthma Child     Social History   Social History Narrative   Divorced.Lives with 3 kids.Originally from [place].Came from [place] 7 months ago.      02/27/2020   Right handed   Caffeine: none    Social History   Tobacco Use  . Smoking status: Former Smoker    Years: 10.00    Types: Cigarettes    Quit date: 06/02/2014    Years since quitting: 5.7  . Smokeless tobacco: Never Used  . Tobacco comment: during the 10 years of smoking, smoked 2-3 cigarettes/day  Substance Use Topics  . Alcohol use: Not Currently    Current Meds  Medication Sig  . acetaminophen (TYLENOL) 500 MG chewable tablet  Chew 1 tablet (500 mg total) by mouth every 8 (eight) hours as needed for pain.  Marland Kitchen albuterol (PROAIR HFA) 108 (90 Base) MCG/ACT inhaler 2 puffs every 4 hours as needed only  if your can't catch your breath  . blood glucose meter kit and supplies Dispense based on patient and insurance preference. Use up to four times daily as directed. (FOR ICD-10 E10.9, E11.9).  Marland Kitchen Blood Pressure Monitoring (SPHYGMOMANOMETER) MISC 1 each by Does not apply route daily.  . busPIRone (BUSPAR) 7.5 MG tablet Take 7.5 mg by mouth 2 (two) times daily.  . Cholecalciferol 1.25 MG (50000 UT) TABS Take 1 tablet by mouth daily.  . cyclobenzaprine (FLEXERIL) 10 MG tablet Take 10 mg by mouth 3 (three) times daily as needed for muscle spasms.   Marland Kitchen dextromethorphan (DELSYM) 30 MG/5ML liquid Take 5 mLs (30 mg total) by mouth 2 (two) times daily.  Marland Kitchen escitalopram (LEXAPRO) 10 MG tablet Take 10 mg by mouth in the morning and at bedtime.   . famotidine (PEPCID) 20 MG tablet Take 20 mg by mouth daily.  . ferrous sulfate 325 (65 FE) MG  tablet Take 325 mg by mouth 2 (two) times daily.  . fluticasone (CUTIVATE) 0.005 % ointment Apply 1 application topically daily.  Marland Kitchen gabapentin (NEURONTIN) 100 MG capsule Take 1 capsule (100 mg total) by mouth 3 (three) times daily.  . hydrochlorothiazide (HYDRODIURIL) 25 MG tablet Take 1 tablet (25 mg total) by mouth daily. For high blood pressure.  . hydroxychloroquine (PLAQUENIL) 200 MG tablet Take 1 tablet (200 mg total) by mouth 2 (two) times daily.  . hydrOXYzine (ATARAX/VISTARIL) 25 MG tablet Take 25 mg by mouth 2 (two) times daily.  Marland Kitchen ibuprofen (ADVIL) 800 MG tablet Take 1 tablet (800 mg total) by mouth every 8 (eight) hours as needed.  Marland Kitchen KLOR-CON M20 20 MEQ tablet TAKE 1 TABLET BY MOUTH DAILY ONLY WHEN YOU TAKE FUROSEMIDE.  Marland Kitchen METFORMIN HCL PO Take by mouth.  . pantoprazole (PROTONIX) 40 MG tablet Take 1 tablet (40 mg total) by mouth daily. Take 30-60 min before first meal of the day  . predniSONE (DELTASONE) 10 MG tablet 2 daily until 100% better then 1 daily  . QUEtiapine (SEROQUEL) 25 MG tablet Take 25 mg by mouth 2 (two) times daily.   . SYMBICORT 160-4.5 MCG/ACT inhaler Inhale 1 puff into the lungs in the morning and at bedtime.  . Tiotropium Bromide-Olodaterol (STIOLTO RESPIMAT) 2.5-2.5 MCG/ACT AERS Inhale 2 puffs into the lungs daily.      Depression screen [facility] 2/9 02/23/2020  Decreased Interest 3  Down, Depressed, Hopeless 3  PHQ - 2 Score 6  Altered sleeping 3  Tired, decreased energy 3  Change in appetite 3  Feeling bad or failure about yourself  3  Trouble concentrating 3  Moving slowly or fidgety/restless 3  Suicidal thoughts 2  PHQ-9 Score 26  Difficult doing work/chores Extremely dIfficult     Objective:   Today's Vitals: LMP 02/15/2020  Vitals with BMI 03/07/2020 02/27/2020 02/23/2020  Height (No Data) 5' 5"  5' 5"   Weight (No Data) 284 lbs 286 lbs 6 oz  BMI - 78.24 23.53  Systolic (No Data) 614 431  Diastolic (No Data) 91 78  Pulse - 80 89     Physical  Exam  The patient sounds significantly dyspneic on the phone, her voice is hoarse and she has persistent coughing.     Assessment   1. Acute respiratory distress  Tests ordered No orders of the defined types were placed in this encounter.    Plan: 1. Based on my conversation today, I am worried that she in fact does have COVID-19 disease and at the very least severe exacerbation of her asthma.  I have instructed the patient to go to the emergency room immediately. 2. This phone call lasted approximately 5 minutes.   No orders of the defined types were placed in this encounter.   Doree Albee, MD

## 2020-03-07 NOTE — ED Notes (Signed)
Pt given a bag lunch and gingerale. 

## 2020-03-07 NOTE — ED Triage Notes (Signed)
EMS reports pt has had cough and sob x 4 days.  Reports tested negative for covid yesterday.  Reports history of asthma.  Temp 100.1, cbg 144, 99% on room air, HR 114 and  bp 148/84.  Reports history of seizures and diabetes as well.  Pt has taken 3 breathing treatments at home today without relief.

## 2020-03-07 NOTE — ED Provider Notes (Signed)
[facility] EMERGENCY DEPARTMENT Provider Note   CSN: [id number] Arrival date & time: 03/07/20  1527     History Chief Complaint  Patient presents with  . Shortness of Breath  . Respiratory Distress    Elizabeth Bautista is a 44 y.o. female with past medical history significant for anemia, asthma, diabetes on Metformin, hypertension, RA on Plaquenil, seizures.  HPI Patient presents to emergency department today via EMS with chief complaint of cough and shortness of breath x4 days.    EMS provides history.  Had negative Covid test a month ago.  Tried 3 breathing treatments at home today without relief. Level 5 caveat applies as patient in severe respiratory distress on arrival.  She has been admitted for asthma exacerbations in the past, has never been intubated.    Past Medical History:  Diagnosis Date  . Anemia   . Asthma   . Diabetes ([place])    on Metformin  . Edema   . 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 HTN (hypertension)   . Rheumatoid arthritis ([place])    on Plaquenil   . Seizures ([place])    last seizure 2019  . Sleep apnea   . Sleep apnea     Patient Active Problem List   Diagnosis Date Noted  . Upper airway cough syndrome 02/10/2020  . Swelling of lower extremity 02/01/2020  . Hypertension 02/01/2020  . Vitamin D deficiency 02/01/2020  . Type 2 diabetes mellitus with hyperglycemia, without long-term current use of insulin ([place]) 02/01/2020  . Anemia 02/01/2020  . Class 3 severe obesity with serious comorbidity and body mass index (BMI) of 45.0 to 49.9 in adult [facility]) 10/22/2019  . Asthma exacerbation 10/18/2019  . Sleep apnea 10/18/2019  . Depression 10/18/2019  . Rheumatoid arthritis ([place]) 10/18/2019    Past Surgical History:  Procedure Laterality Date  . APPENDECTOMY    . CESAREAN SECTION     x3     OB History   No obstetric history on file.     Family History   Problem Relation Age of Onset  . High blood pressure Mother   . Diabetes Father   . Diabetes Sister   . Heart Problems Sister   . Diabetes Brother   . Diabetes Paternal Grandmother   . Diabetes Other        father's side "everybody died from Diabetes"  . High blood pressure Other        mother's side, multiple siblings with this   . Heart attack Other        family member on mother's side   . Diabetes Paternal Aunt   . Seizures Cousin        not sibings to the other cousins with seizures  . Seizures Cousin        not sibings to the other cousins with seizures  . Seizures Cousin        not sibings to the other cousins with seizures  . Cervical cancer Maternal Aunt   . Breast cancer Cousin   . Dementia Maternal Aunt   . Asthma Maternal Aunt   . Heart Problems Maternal Grandmother   . Asthma Child   . Asthma Child     Social History   Tobacco Use  . Smoking status: Former Smoker    Years: 10.00    Types: Cigarettes    Quit date: 06/02/2014  Years since quitting: 5.7  . Smokeless tobacco: Never Used  . Tobacco comment: during the 10 years of smoking, smoked 2-3 cigarettes/day  Vaping Use  . Vaping Use: Never used  Substance Use Topics  . Alcohol use: Not Currently  . Drug use: Not Currently    Home Medications Prior to Admission medications   Medication Sig Start Date End Date Taking? Authorizing Provider  acetaminophen (TYLENOL) 500 MG chewable tablet Chew 1 tablet (500 mg total) by mouth every 8 (eight) hours as needed for pain. 02/20/20   Ailene Ards, NP  albuterol [facility] HFA) [phone] Base) MCG/ACT inhaler 2 puffs every 4 hours as needed only  if your can't catch your breath 02/09/20   Tanda Rockers, MD  blood glucose meter kit and supplies Dispense based on patient and insurance preference. Use up to four times daily as directed. (FOR ICD-10 E10.9, E11.9). 02/01/20   Ailene Ards, NP  Blood Pressure Monitoring [facility]) MISC 1 each by Does not apply  route daily. 01/18/20   Ailene Ards, NP  busPIRone (BUSPAR) 7.5 MG tablet Take 7.5 mg by mouth 2 (two) times daily. 09/05/19   [provider]  Cholecalciferol 1.25 MG (50000 UT) TABS Take 1 tablet by mouth daily. 02/01/20   Ailene Ards, NP  cyclobenzaprine (FLEXERIL) 10 MG tablet Take 10 mg by mouth 3 (three) times daily as needed for muscle spasms.  08/27/19   [provider]  dextromethorphan (DELSYM) 30 MG/5ML liquid Take 5 mLs (30 mg total) by mouth 2 (two) times daily. 10/23/19   Orson Eva, MD  escitalopram (LEXAPRO) 10 MG tablet Take 10 mg by mouth in the morning and at bedtime.  09/05/19   [provider]  famotidine (PEPCID) 20 MG tablet Take 20 mg by mouth daily. 07/31/19   [provider]  ferrous sulfate 325 (65 FE) MG tablet Take 325 mg by mouth 2 (two) times daily. 05/17/19   [provider]  fluticasone (CUTIVATE) 0.005 % ointment Apply 1 application topically daily. 10/11/19   [provider]  gabapentin (NEURONTIN) 100 MG capsule Take 1 capsule (100 mg total) by mouth 3 (three) times daily. 02/27/20   Melvenia Beam, MD  hydrochlorothiazide (HYDRODIURIL) 25 MG tablet Take 1 tablet (25 mg total) by mouth daily. For high blood pressure. 02/01/20   Ailene Ards, NP  hydroxychloroquine (PLAQUENIL) 200 MG tablet Take 1 tablet (200 mg total) by mouth 2 (two) times daily. 02/20/20   Ailene Ards, NP  hydrOXYzine (ATARAX/VISTARIL) 25 MG tablet Take 25 mg by mouth 2 (two) times daily. 02/01/20   [provider]  ibuprofen (ADVIL) 800 MG tablet Take 1 tablet (800 mg total) by mouth every 8 (eight) hours as needed. 02/20/20   Ailene Ards, NP  KLOR-CON M20 20 MEQ tablet TAKE 1 TABLET BY MOUTH DAILY ONLY WHEN YOU TAKE FUROSEMIDE. 03/05/20   Doree Albee, MD  METFORMIN HCL PO Take by mouth.    [provider]  pantoprazole (PROTONIX) 40 MG tablet Take 1 tablet (40 mg total) by mouth daily. Take 30-60 min before first meal of the  day 02/09/20   Tanda Rockers, MD  predniSONE (DELTASONE) 10 MG tablet 2 daily until 100% better then 1 daily 02/09/20   Tanda Rockers, MD  QUEtiapine (SEROQUEL) 25 MG tablet Take 25 mg by mouth 2 (two) times daily.  09/05/19   [provider]  SYMBICORT 160-4.5 MCG/ACT inhaler Inhale  1 puff into the lungs in the morning and at bedtime. 01/19/20   [provider]  Tiotropium Bromide-Olodaterol (STIOLTO RESPIMAT) 2.5-2.5 MCG/ACT AERS Inhale 2 puffs into the lungs daily. 02/21/20   Tanda Rockers, MD    Allergies    Phenytoin and Phenylbutazones  Review of Systems   Review of Systems  Unable to perform ROS: Severe respiratory distress    Physical Exam Updated Vital Signs BP 129/90   Pulse 94   Temp 99 F (37.2 C) (Oral)   Resp (!) 22   Ht 5' 5"  (1.651 m)   Wt 128.8 kg   LMP 02/15/2020   SpO2 100%   BMI 47.26 kg/m   Physical Exam Vitals and nursing note reviewed.  Constitutional:      General: She is not in acute distress.    Appearance: She is obese. She is not ill-appearing.  HENT:     Head: Normocephalic and atraumatic.     Right Ear: Tympanic membrane and external ear normal.     Left Ear: Tympanic membrane and external ear normal.     Nose: Nose normal.     Mouth/Throat:     Mouth: Mucous membranes are moist.     Pharynx: Oropharynx is clear.  Eyes:     General: No scleral icterus.       Right eye: No discharge.        Left eye: No discharge.     Extraocular Movements: Extraocular movements intact.     Conjunctiva/sclera: Conjunctivae normal.     Pupils: Pupils are equal, round, and reactive to light.  Neck:     Vascular: No JVD.  Cardiovascular:     Rate and Rhythm: Normal rate and regular rhythm.     Pulses: Normal pulses.          Radial pulses are 2+ on the right side and 2+ on the left side.     Heart sounds: Normal heart sounds.  Pulmonary:     Effort: Respiratory distress present.     Comments: Symmetric chest rise.  She is tachypneic.   Wheezing heard in all lung fields.  She is only able to speak in one-word responses.  No stridor.  Oxygen saturation is 100% on room air.  Accessory muscle usage. Dry hacking cough during exam. Abdominal:     Comments: Abdomen is soft, non-distended, and non-tender in all quadrants. No rigidity, no guarding. No peritoneal signs.  Musculoskeletal:        General: Normal range of motion.     Cervical back: Normal range of motion.  Skin:    General: Skin is warm and dry.     Capillary Refill: Capillary refill takes less than 2 seconds.  Neurological:     Mental Status: She is oriented to person, place, and time.     GCS: GCS eye subscore is 4. GCS verbal subscore is 5. GCS motor subscore is 6.     Comments: Fluent speech, no facial droop.  Psychiatric:        Behavior: Behavior normal.     ED Results / Procedures / Treatments   Labs (all labs ordered are listed, but only abnormal results are displayed) Labs Reviewed  CBC WITH DIFFERENTIAL/PLATELET - Abnormal; Notable for the following components:      Result Value   Hemoglobin 10.8 (*)    HCT 35.2 (*)    MCV 71.7 (*)    MCH 22.0 (*)    RDW 17.2 (*)  Eosinophils Absolute 0.6 (*)    All other components within normal limits  COMPREHENSIVE METABOLIC PANEL - Abnormal; Notable for the following components:   Sodium 134 (*)    Potassium 3.3 (*)    Glucose, Bld 109 (*)    Calcium 8.7 (*)    Albumin 3.4 (*)    All other components within normal limits  RESP PANEL BY RT PCR (RSV, FLU A&B, COVID)  HCG, QUANTITATIVE, PREGNANCY    EKG EKG Interpretation  Date/Time:  Wednesday March 07 2020 16:06:06 EDT Ventricular Rate:  106 PR Interval:    QRS Duration: 84 QT Interval:  344 QTC Calculation: 457 R Axis:   27 Text Interpretation: Sinus tachycardia LVH by voltage Confirmed by Fredia Sorrow [phone]) on 03/07/2020 4:11:31 PM   Radiology DG Chest Portable 1 View  Result Date: 03/07/2020 CLINICAL DATA:  Cough, chest pain.  EXAM: PORTABLE CHEST 1 VIEW COMPARISON:  Oct 22, 2019 FINDINGS: The heart size and mediastinal contours are within normal limits. No consolidation. No visible pleural effusions or pneumothorax. Low lung volumes. The visualized skeletal structures are unremarkable. IMPRESSION: No active disease. Electronically Signed   By: Margaretha Sheffield MD   On: 03/07/2020 16:20    Procedures Procedures (including critical care time)  Medications Ordered in ED Medications  methylPREDNISolone sodium succinate (SOLU-MEDROL) 125 mg/2 mL injection 125 mg (125 mg Intravenous Given 03/07/20 1559)  magnesium sulfate IVPB 2 g 50 mL (0 g Intravenous Stopped 03/07/20 1655)  ondansetron (ZOFRAN) injection 4 mg (4 mg Intravenous Given 03/07/20 1612)  ipratropium-albuterol (DUONEB) 0.5-2.5 (3) MG/3ML nebulizer solution 3 mL (3 mLs Nebulization Given 03/07/20 1634)  chlorpheniramine-HYDROcodone (TUSSIONEX) 10-8 MG/5ML suspension 5 mL (5 mLs Oral Given 03/07/20 1702)  albuterol (PROVENTIL,VENTOLIN) solution continuous neb (10 mg/hr Nebulization Given 03/07/20 1714)    ED Course  I have reviewed the triage vital signs and the nursing notes.  Pertinent labs & imaging results that were available during my care of the patient were reviewed by me and considered in my medical decision making (see chart for details).    MDM Rules/Calculators/A&P                          History provided by EMS with additional history obtained from chart review.    44 year old female presenting with asthma exacerbation.  Afebrile, normotensive.  She is tachypneic, oxygen saturation 100% on room air.  She arrives in respiratory distress.  She has significant work of breathing.  Wheezing heard. Patient given Solu-Medrol, mag, and DuoNeb. Labs show no leukocytosis, hemoglobin 10.8, history of anemia, appears close to her baseline.  CMP without significant electrolyte derangement, no renal insufficiency, normal anion gap.  Covid and influenza test are  negative.  Pregnancy test is negative. I viewed pt's chest xray and it does not suggest acute infectious processes  Reassessed after first interventions she still has significant work of breathing.  Chest still feels tight.  Still only able to talk in one-word sentences.  Patient had continuous neb.  She was reassessed after completion and is still not back to baseline.  Still has increased work of breathing, not as severe as arrival.  Spoke with Dr. Josephine Cables with hospitalist service who agrees to assume care of patient and bring into the hospital for further evaluation and management.     Portions of this note were generated with Lobbyist. Dictation errors may occur despite best attempts at proofreading.   Final Clinical Impression(s) /  ED Diagnoses Final diagnoses:  Severe persistent asthma with exacerbation    Rx / DC Orders ED Discharge Orders    None       Flint Melter 03/07/20 2028    Fredia Sorrow, MD 03/09/20 1534

## 2020-03-07 NOTE — H&P (Signed)
History and Physical  Elizabeth Bautista:[id number] DOB: 06/19/1975 DOA: 03/07/2020  Referring physician: Cherre Robins, PA-C PCP: Elizabeth Albee, MD  Patient coming from: Home  Chief Complaint: Shortness of breath  HPI: Elizabeth Bautista is a 44 y.o. female with medical history significant for asthma, anemia, seizures, sleep apnea, type II DM, hypertension, GERD, rheumatoid arthritis who presents to the emergency department due to 2-week onset of shortness of breath which worsened within last 4 days.  Patient complained of increased shortness of breath and wheezing which started about 2 weeks ago, she states that she followed up with her pulmonologist who prescribed her with Stiolto and prednisone with improved symptoms, she completed the prednisone about 4-5 days ago and shortness of breath increased and progressively worsened since last 4 days.  This was associated with cough with occasional production of clear mucus and pleuritic chest pain (due to coughing).  Home breathing treatment with nebulizer did not alleviate the patient's symptoms.  Patient has a history of sleep apnea and she uses CPAP at night, but she states that the CPAP became nonfunctional about 3 weeks ago and she was in the process of obtaining another one.  She had a virtual consult with her PCP today and patient was referred to the emergency department for further evaluation and management.  She denies fever, chills, headache, nausea, vomiting or diarrhea.  She has never been intubated due to asthma exacerbation. Patient had similar presentation in May of this year (5/18-5/23) and she was referred to a pulmonologist on discharge.  This is the third hospital admission for asthma exacerbation history by patient.  ED Course:  In the emergency department, O2 sat was 96-100% on room air, she was tachycardic and tachypneic.  Work-up in the ED showed microcytic anemia, hypokalemia, hypoalbuminemia and mild hyponatremia.  SARS coronavirus  2, influenza A and B and RSV were negative.  Chest x-ray showed no active disease.  She was treated with albuterol, DuoNeb.  IV Solu-Medrol 25 Mg x1 and IV magnesium 2 g as well as tussionex were given.  IV Zofran 4 Mg x1 was given as well.  Hospitalist was asked to admit patient for further evaluation management.  Review of Systems: Constitutional: Negative for chills and fever.  HENT: Negative for ear pain and sore throat.   Eyes: Negative for pain and visual disturbance.  Respiratory: Positive for cough (with occasional clear mucus), chest tightness and shortness of breath.   Cardiovascular: Negative for chest pain and palpitations.  Gastrointestinal: Negative for abdominal pain and vomiting.  Endocrine: Negative for polyphagia and polyuria.  Genitourinary: Negative for decreased urine volume, dysuria, enuresis, hematuria, vaginal discharge and vaginal pain.  Musculoskeletal: Negative for arthralgias and back pain.  Skin: Negative for color change and rash.  Allergic/Immunologic: Negative for immunocompromised state.  Neurological: Negative for tremors, syncope, speech difficulty, weakness, light-headedness and headaches.  Hematological: Does not bruise/bleed easily.  All other systems reviewed and are negative   Past Medical History:  Diagnosis Date  . Anemia   . Asthma   . Diabetes ([place])    on Metformin  . Edema   . 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 HTN (hypertension)   . Rheumatoid arthritis ([place])    on Plaquenil   . Seizures ([place])    last seizure 2019  . Sleep apnea   . Sleep apnea    Past Surgical History:  Procedure Laterality Date  . APPENDECTOMY    . CESAREAN SECTION     x3    Social History:  reports that she quit smoking about 5 years ago. Her smoking use included cigarettes. She quit after 10.00 years of use. She has never used smokeless tobacco. She reports previous  alcohol use. She reports previous drug use.   Allergies  Allergen Reactions  . Phenytoin Anaphylaxis    Other reaction(s): swelling and heart rate decreased  . Phenylbutazones     Family History  Problem Relation Age of Onset  . High blood pressure Mother   . Diabetes Father   . Diabetes Sister   . Heart Problems Sister   . Diabetes Brother   . Diabetes Paternal Grandmother   . Diabetes Other        father's side "everybody died from Diabetes"  . High blood pressure Other        mother's side, multiple siblings with this   . Heart attack Other        family member on mother's side   . Diabetes Paternal Aunt   . Seizures Cousin        not sibings to the other cousins with seizures  . Seizures Cousin        not sibings to the other cousins with seizures  . Seizures Cousin        not sibings to the other cousins with seizures  . Cervical cancer Maternal Aunt   . Breast cancer Cousin   . Dementia Maternal Aunt   . Asthma Maternal Aunt   . Heart Problems Maternal Grandmother   . Asthma Child   . Asthma Child     Prior to Admission medications   Medication Sig Start Date End Date Taking? Authorizing Provider  acetaminophen (TYLENOL) 500 MG chewable tablet Chew 1 tablet (500 mg total) by mouth every 8 (eight) hours as needed for pain. 02/20/20   Ailene Ards, NP  albuterol [facility] HFA) [phone] Base) MCG/ACT inhaler 2 puffs every 4 hours as needed only  if your can't catch your breath 02/09/20   Tanda Rockers, MD  blood glucose meter kit and supplies Dispense based on patient and insurance preference. Use up to four times daily as directed. (FOR ICD-10 E10.9, E11.9). 02/01/20   Ailene Ards, NP  Blood Pressure Monitoring [facility]) MISC 1 each by Does not apply route daily. 01/18/20   Ailene Ards, NP  busPIRone (BUSPAR) 7.5 MG tablet Take 7.5 mg by mouth 2 (two) times daily. 09/05/19   [provider]  Cholecalciferol 1.25 MG (50000 UT) TABS Take 1 tablet by mouth  daily. 02/01/20   Ailene Ards, NP  cyclobenzaprine (FLEXERIL) 10 MG tablet Take 10 mg by mouth 3 (three) times daily as needed for muscle spasms.  08/27/19   [provider]  dextromethorphan (DELSYM) 30 MG/5ML liquid Take 5 mLs (30 mg total) by mouth 2 (two) times daily. 10/23/19   Orson Eva, MD  escitalopram (LEXAPRO) 10 MG tablet Take 10 mg by mouth in the morning and at bedtime.  09/05/19   [provider]  famotidine (PEPCID) 20 MG tablet Take 20 mg by mouth daily. 07/31/19   [provider]  ferrous sulfate 325 (65 FE) MG tablet Take 325 mg by mouth 2 (two) times daily. 05/17/19   [provider]  fluticasone (CUTIVATE) 0.005 % ointment Apply 1 application topically daily. 10/11/19   [provider]  gabapentin (  NEURONTIN) 100 MG capsule Take 1 capsule (100 mg total) by mouth 3 (three) times daily. 02/27/20   Melvenia Beam, MD  hydrochlorothiazide (HYDRODIURIL) 25 MG tablet Take 1 tablet (25 mg total) by mouth daily. For high blood pressure. 02/01/20   Ailene Ards, NP  hydroxychloroquine (PLAQUENIL) 200 MG tablet Take 1 tablet (200 mg total) by mouth 2 (two) times daily. 02/20/20   Ailene Ards, NP  hydrOXYzine (ATARAX/VISTARIL) 25 MG tablet Take 25 mg by mouth 2 (two) times daily. 02/01/20   [provider]  ibuprofen (ADVIL) 800 MG tablet Take 1 tablet (800 mg total) by mouth every 8 (eight) hours as needed. 02/20/20   Ailene Ards, NP  KLOR-CON M20 20 MEQ tablet TAKE 1 TABLET BY MOUTH DAILY ONLY WHEN YOU TAKE FUROSEMIDE. 03/05/20   Elizabeth Albee, MD  METFORMIN HCL PO Take by mouth.    [provider]  pantoprazole (PROTONIX) 40 MG tablet Take 1 tablet (40 mg total) by mouth daily. Take 30-60 min before first meal of the day 02/09/20   Tanda Rockers, MD  predniSONE (DELTASONE) 10 MG tablet 2 daily until 100% better then 1 daily 02/09/20   Tanda Rockers, MD  QUEtiapine (SEROQUEL) 25 MG tablet Take 25 mg by mouth 2 (two) times daily.   09/05/19   [provider]  SYMBICORT 160-4.5 MCG/ACT inhaler Inhale 1 puff into the lungs in the morning and at bedtime. 01/19/20   [provider]  Tiotropium Bromide-Olodaterol (STIOLTO RESPIMAT) 2.5-2.5 MCG/ACT AERS Inhale 2 puffs into the lungs daily. 02/21/20   Tanda Rockers, MD    Physical Exam: BP 133/76 (BP Location: Left Arm)   Pulse (!) 117   Temp 98.2 F (36.8 C) (Oral)   Resp (!) 24   Ht 5' 5" (1.651 m)   Wt 128.8 kg   LMP 02/15/2020   SpO2 98%   BMI 47.26 kg/m   . General: 44 y.o. year-old female obese in no acute distress.  Alert and oriented x3. Marland Kitchen HEENT: NCAT, EOMI . Neck: Supple, trachea midline . Cardiovascular: Tachycardia.  Regular rate and rhythm with no rubs or gallops.  No thyromegaly or JVD noted.  No lower extremity edema. 2/4 pulses in all 4 extremities. Marland Kitchen Respiratory: Tachypnea, diffuse expiratory wheeze.  She was able to maintain communication with some increased work of breathing. . Abdomen: Soft nontender nondistended with normal bowel sounds x4 quadrants. . Muskuloskeletal: No cyanosis, clubbing or edema noted bilaterally . Neuro: CN II-XII intact, strength, sensation, reflexes . Skin: No ulcerative lesions noted or rashes . Psychiatry: Judgement and insight appear normal. Mood is appropriate for condition and setting          Labs on Admission:  Basic Metabolic Panel: Recent Labs  Lab 03/07/20 1536  NA 134*  K 3.3*  CL 100  CO2 25  GLUCOSE 109*  BUN 8  CREATININE 0.71  CALCIUM 8.7*   Liver Function Tests: Recent Labs  Lab 03/07/20 1536  AST 16  ALT 18  ALKPHOS 75  BILITOT 0.4  PROT 7.3  ALBUMIN 3.4*   No results for input(s): LIPASE, AMYLASE in the last 168 hours. No results for input(s): AMMONIA in the last 168 hours. CBC: Recent Labs  Lab 03/07/20 1536  WBC 9.7  NEUTROABS 6.6  HGB 10.8*  HCT 35.2*  MCV 71.7*  PLT 284   Cardiac Enzymes: No results for input(s): CKTOTAL, CKMB, CKMBINDEX, TROPONINI in  the last 168 hours.  BNP (last 3 results) No results for input(s): BNP in the last 8760 hours.  ProBNP (last 3 results) No results for input(s): PROBNP in the last 8760 hours.  CBG: No results for input(s): GLUCAP in the last 168 hours.  Radiological Exams on Admission: DG Chest Portable 1 View  Result Date: 03/07/2020 CLINICAL DATA:  Cough, chest pain. EXAM: PORTABLE CHEST 1 VIEW COMPARISON:  Oct 22, 2019 FINDINGS: The heart size and mediastinal contours are within normal limits. No consolidation. No visible pleural effusions or pneumothorax. Low lung volumes. The visualized skeletal structures are unremarkable. IMPRESSION: No active disease. Electronically Signed   By: Margaretha Sheffield MD   On: 03/07/2020 16:20    EKG: I independently viewed the EKG done and my findings are as followed: Sinus tachycardia rate of 106 bpm  Assessment/Plan Present on Admission: . Asthma exacerbation . Depression . Rheumatoid arthritis ([place]) . Sleep apnea . Hypertension . Anemia  Principal Problem:   Asthma exacerbation Active Problems:   Sleep apnea   Depression   Rheumatoid arthritis ([place])   Class 3 severe obesity with serious comorbidity and body mass index (BMI) of 45.0 to 49.9 in adult [facility])   Hypertension   Anemia   GERD (gastroesophageal reflux disease)   Anxiety   History of seizures   Acute asthma exacerbation Continue duo nebs, Solu-Medrol, azithromycin. Continue Protonix to prevent steroid-induced ulcer Continue incentive spirometry and flutter valve Continue on dextromethorphan, Tussionex for cough Patient is currently not requiring supplemental oxygen, O2 sats at 96-100% on room air.  Consider supplemental oxygen to maintain O2 sat > 92% with plan to wean patient off oxygen as tolerated if O2 sat on room air decreases.  Hypokalemia possibly due to diuretic effect K+ 3.3, this will be replenished  Class 3 severe obesity and obstructive sleep apnea in the setting of  asthma exacerbation Patient states that her CPAP became nonfunctional 3 weeks ago, symptoms started about 2 weeks ago.  Continue CPAP at night BMI is 47.26 kg/meter squared Patient will be counseled on lifestyle modification, diet and exercise when more stable  Type 2 diabetes mellitus Continue insulin sliding scale and hypoglycemia protocol A1c during last admission was 6.6, this was 6.9 last month  Metformin will be held at this time  Essential hypertension Continue home HCTZ, considering history of type II DM, this may need to be changed to an ACE inhibitor  GERD Continue Protonix  Anemia (microcytic) Continue ferrous sulfate 325 mg twice daily per home regimen  Rheumatoid arthritis Continue Plaquenil per home regimen  Depression/anxiety Continue buspirone, Lexapro per home regimen  Chronic back pain Continue on Tylenol 650 mg p.o. every 6 as needed for mild pain Continue ibuprofen 600 mg p.o. every 8 hours as needed for moderate pain  History of seizures Patient states that she is currently not on any medication Last seizure episode was 2 weeks ago, she states that she has a pending outpatient neurological consult for further evaluation Monitor for seizures and treat accordingly Continue fall precaution and neurochecks  DVT prophylaxis: Lovenox  Code Status: Full code  Family Communication: None at bedside  Disposition Plan:  Patient is from:                        home Anticipated DC to:                   home Anticipated DC date:  1 day Anticipated DC barriers:          Patient unstable for discharge at this time due to acute asthma exacerbation with increased work of breathing requiring inpatient management.   Consults called: None  Admission status: Observation    Bernadette Hoit MD [facility]  If 7PM-7AM, please contact night-coverage [email or web address] Password [facility]  03/07/2020, 8:49 PM

## 2020-03-08 ENCOUNTER — Other Ambulatory Visit: Payer: Self-pay | Admitting: Neurology

## 2020-03-08 DIAGNOSIS — I10 Essential (primary) hypertension: Secondary | ICD-10-CM | POA: Diagnosis present

## 2020-03-08 DIAGNOSIS — Z888 Allergy status to other drugs, medicaments and biological substances status: Secondary | ICD-10-CM | POA: Diagnosis not present

## 2020-03-08 DIAGNOSIS — Z833 Family history of diabetes mellitus: Secondary | ICD-10-CM | POA: Diagnosis not present

## 2020-03-08 DIAGNOSIS — E871 Hypo-osmolality and hyponatremia: Secondary | ICD-10-CM | POA: Diagnosis present

## 2020-03-08 DIAGNOSIS — J45901 Unspecified asthma with (acute) exacerbation: Secondary | ICD-10-CM

## 2020-03-08 DIAGNOSIS — G40909 Epilepsy, unspecified, not intractable, without status epilepticus: Secondary | ICD-10-CM | POA: Diagnosis not present

## 2020-03-08 DIAGNOSIS — E876 Hypokalemia: Secondary | ICD-10-CM | POA: Diagnosis present

## 2020-03-08 DIAGNOSIS — R569 Unspecified convulsions: Secondary | ICD-10-CM | POA: Diagnosis not present

## 2020-03-08 DIAGNOSIS — J4521 Mild intermittent asthma with (acute) exacerbation: Secondary | ICD-10-CM | POA: Diagnosis not present

## 2020-03-08 DIAGNOSIS — Z803 Family history of malignant neoplasm of breast: Secondary | ICD-10-CM | POA: Diagnosis not present

## 2020-03-08 DIAGNOSIS — J4540 Moderate persistent asthma, uncomplicated: Secondary | ICD-10-CM | POA: Diagnosis present

## 2020-03-08 DIAGNOSIS — Z825 Family history of asthma and other chronic lower respiratory diseases: Secondary | ICD-10-CM | POA: Diagnosis not present

## 2020-03-08 DIAGNOSIS — R058 Other specified cough: Secondary | ICD-10-CM

## 2020-03-08 DIAGNOSIS — J4550 Severe persistent asthma, uncomplicated: Secondary | ICD-10-CM | POA: Diagnosis present

## 2020-03-08 DIAGNOSIS — Z8249 Family history of ischemic heart disease and other diseases of the circulatory system: Secondary | ICD-10-CM | POA: Diagnosis not present

## 2020-03-08 DIAGNOSIS — T380X5A Adverse effect of glucocorticoids and synthetic analogues, initial encounter: Secondary | ICD-10-CM | POA: Diagnosis not present

## 2020-03-08 DIAGNOSIS — J4551 Severe persistent asthma with (acute) exacerbation: Secondary | ICD-10-CM | POA: Diagnosis present

## 2020-03-08 DIAGNOSIS — Z87898 Personal history of other specified conditions: Secondary | ICD-10-CM | POA: Diagnosis not present

## 2020-03-08 DIAGNOSIS — K219 Gastro-esophageal reflux disease without esophagitis: Secondary | ICD-10-CM

## 2020-03-08 DIAGNOSIS — F419 Anxiety disorder, unspecified: Secondary | ICD-10-CM | POA: Diagnosis present

## 2020-03-08 DIAGNOSIS — G4733 Obstructive sleep apnea (adult) (pediatric): Secondary | ICD-10-CM | POA: Diagnosis present

## 2020-03-08 DIAGNOSIS — Y92239 Unspecified place in hospital as the place of occurrence of the external cause: Secondary | ICD-10-CM | POA: Diagnosis not present

## 2020-03-08 DIAGNOSIS — F32A Depression, unspecified: Secondary | ICD-10-CM | POA: Diagnosis present

## 2020-03-08 DIAGNOSIS — Z8049 Family history of malignant neoplasm of other genital organs: Secondary | ICD-10-CM | POA: Diagnosis not present

## 2020-03-08 DIAGNOSIS — Z20822 Contact with and (suspected) exposure to covid-19: Secondary | ICD-10-CM | POA: Diagnosis present

## 2020-03-08 DIAGNOSIS — M069 Rheumatoid arthritis, unspecified: Secondary | ICD-10-CM | POA: Diagnosis present

## 2020-03-08 DIAGNOSIS — Z6841 Body Mass Index (BMI) 40.0 and over, adult: Secondary | ICD-10-CM | POA: Diagnosis not present

## 2020-03-08 DIAGNOSIS — R404 Transient alteration of awareness: Secondary | ICD-10-CM

## 2020-03-08 DIAGNOSIS — E1165 Type 2 diabetes mellitus with hyperglycemia: Secondary | ICD-10-CM | POA: Diagnosis not present

## 2020-03-08 DIAGNOSIS — E8809 Other disorders of plasma-protein metabolism, not elsewhere classified: Secondary | ICD-10-CM | POA: Diagnosis present

## 2020-03-08 DIAGNOSIS — Z87891 Personal history of nicotine dependence: Secondary | ICD-10-CM | POA: Diagnosis not present

## 2020-03-08 DIAGNOSIS — D509 Iron deficiency anemia, unspecified: Secondary | ICD-10-CM | POA: Diagnosis present

## 2020-03-08 LAB — CBC
HCT: 34.9 % — ABNORMAL LOW (ref 36.0–46.0)
Hemoglobin: 10.6 g/dL — ABNORMAL LOW (ref 12.0–15.0)
MCH: 21.9 pg — ABNORMAL LOW (ref 26.0–34.0)
MCHC: 30.4 g/dL (ref 30.0–36.0)
MCV: 72.3 fL — ABNORMAL LOW (ref 80.0–100.0)
Platelets: 281 10*3/uL (ref 150–400)
RBC: 4.83 MIL/uL (ref 3.87–5.11)
RDW: 17.3 % — ABNORMAL HIGH (ref 11.5–15.5)
WBC: 13.7 10*3/uL — ABNORMAL HIGH (ref 4.0–10.5)
nRBC: 0 % (ref 0.0–0.2)

## 2020-03-08 LAB — COMPREHENSIVE METABOLIC PANEL
ALT: 19 U/L (ref 0–44)
AST: 16 U/L (ref 15–41)
Albumin: 3.4 g/dL — ABNORMAL LOW (ref 3.5–5.0)
Alkaline Phosphatase: 74 U/L (ref 38–126)
Anion gap: 10 (ref 5–15)
BUN: 10 mg/dL (ref 6–20)
CO2: 21 mmol/L — ABNORMAL LOW (ref 22–32)
Calcium: 9 mg/dL (ref 8.9–10.3)
Chloride: 101 mmol/L (ref 98–111)
Creatinine, Ser: 0.7 mg/dL (ref 0.44–1.00)
GFR calc non Af Amer: >60 mL/min (ref >60)
Glucose, Bld: 262 mg/dL — ABNORMAL HIGH (ref 70–99)
Potassium: 4.5 mmol/L (ref 3.5–5.1)
Sodium: 132 mmol/L — ABNORMAL LOW (ref 135–145)
Total Bilirubin: 0.4 mg/dL (ref 0.3–1.2)
Total Protein: 7.2 g/dL (ref 6.5–8.1)

## 2020-03-08 LAB — CBG MONITORING, ED
Glucose-Capillary: 183 mg/dL — ABNORMAL HIGH (ref 70–99)
Glucose-Capillary: 193 mg/dL — ABNORMAL HIGH (ref 70–99)

## 2020-03-08 LAB — PROTIME-INR
INR: 1.1 (ref 0.8–1.2)
Prothrombin Time: 13.9 seconds (ref 11.4–15.2)

## 2020-03-08 LAB — APTT: aPTT: 35 seconds (ref 24–36)

## 2020-03-08 LAB — GLUCOSE, CAPILLARY
Glucose-Capillary: 149 mg/dL — ABNORMAL HIGH (ref 70–99)
Glucose-Capillary: 217 mg/dL — ABNORMAL HIGH (ref 70–99)

## 2020-03-08 LAB — MAGNESIUM: Magnesium: 2 mg/dL (ref 1.7–2.4)

## 2020-03-08 MED ORDER — INSULIN ASPART 100 UNIT/ML ~~LOC~~ SOLN
0.0000 [IU] | Freq: Every day | SUBCUTANEOUS | Status: DC
  Administered 2020-03-08 – 2020-03-12 (×3): 2 [IU] via SUBCUTANEOUS

## 2020-03-08 MED ORDER — CYCLOBENZAPRINE HCL 10 MG PO TABS
10.0000 mg | ORAL_TABLET | Freq: Three times a day (TID) | ORAL | Status: DC | PRN
  Administered 2020-03-10: 10 mg via ORAL
  Filled 2020-03-08: qty 1

## 2020-03-08 MED ORDER — HYDROCOD POLST-CPM POLST ER 10-8 MG/5ML PO SUER
5.0000 mL | Freq: Four times a day (QID) | ORAL | Status: DC
  Administered 2020-03-08 – 2020-03-13 (×18): 5 mL via ORAL
  Filled 2020-03-08 (×19): qty 5

## 2020-03-08 MED ORDER — INSULIN ASPART 100 UNIT/ML ~~LOC~~ SOLN
0.0000 [IU] | Freq: Three times a day (TID) | SUBCUTANEOUS | Status: DC
  Administered 2020-03-08: 3 [IU] via SUBCUTANEOUS
  Administered 2020-03-08 (×2): 4 [IU] via SUBCUTANEOUS
  Administered 2020-03-09: 3 [IU] via SUBCUTANEOUS
  Administered 2020-03-09 – 2020-03-10 (×3): 7 [IU] via SUBCUTANEOUS
  Administered 2020-03-11 – 2020-03-12 (×2): 3 [IU] via SUBCUTANEOUS
  Administered 2020-03-12: 7 [IU] via SUBCUTANEOUS
  Administered 2020-03-12 – 2020-03-13 (×2): 4 [IU] via SUBCUTANEOUS
  Administered 2020-03-13 – 2020-03-14 (×2): 3 [IU] via SUBCUTANEOUS
  Administered 2020-03-14: 4 [IU] via SUBCUTANEOUS
  Filled 2020-03-08 (×2): qty 1

## 2020-03-08 MED ORDER — METHYLPREDNISOLONE SODIUM SUCC 125 MG IJ SOLR
80.0000 mg | Freq: Two times a day (BID) | INTRAMUSCULAR | Status: DC
  Administered 2020-03-08 – 2020-03-09 (×3): 80 mg via INTRAVENOUS
  Filled 2020-03-08 (×4): qty 2

## 2020-03-08 MED ORDER — PANTOPRAZOLE SODIUM 40 MG PO TBEC
40.0000 mg | DELAYED_RELEASE_TABLET | Freq: Two times a day (BID) | ORAL | Status: DC
  Administered 2020-03-08 – 2020-03-14 (×12): 40 mg via ORAL
  Filled 2020-03-08 (×13): qty 1

## 2020-03-08 MED ORDER — ARFORMOTEROL TARTRATE 15 MCG/2ML IN NEBU
15.0000 ug | INHALATION_SOLUTION | Freq: Two times a day (BID) | RESPIRATORY_TRACT | Status: DC
  Administered 2020-03-08 – 2020-03-14 (×11): 15 ug via RESPIRATORY_TRACT
  Filled 2020-03-08 (×11): qty 2

## 2020-03-08 MED ORDER — INSULIN ASPART 100 UNIT/ML ~~LOC~~ SOLN: 0.0000 [IU] | Freq: Every day | SUBCUTANEOUS | Status: DC

## 2020-03-08 MED ORDER — GABAPENTIN 100 MG PO CAPS: 100.0000 mg | ORAL_CAPSULE | Freq: Three times a day (TID) | ORAL | Status: DC

## 2020-03-08 MED ORDER — IBUPROFEN 600 MG PO TABS
600.0000 mg | ORAL_TABLET | Freq: Three times a day (TID) | ORAL | Status: DC | PRN
  Administered 2020-03-08: 600 mg via ORAL
  Filled 2020-03-08: qty 1
  Filled 2020-03-08: qty 2

## 2020-03-08 MED ORDER — INSULIN ASPART 100 UNIT/ML ~~LOC~~ SOLN
6.0000 [IU] | Freq: Three times a day (TID) | SUBCUTANEOUS | Status: DC
  Administered 2020-03-08 (×3): 6 [IU] via SUBCUTANEOUS
  Filled 2020-03-08 (×2): qty 1

## 2020-03-08 MED ORDER — TRAMADOL HCL 50 MG PO TABS
50.0000 mg | ORAL_TABLET | Freq: Once | ORAL | Status: AC
  Administered 2020-03-08: 50 mg via ORAL
  Filled 2020-03-08: qty 1

## 2020-03-08 MED ORDER — ARFORMOTEROL TARTRATE 15 MCG/2ML IN NEBU: 15.0000 ug | INHALATION_SOLUTION | Freq: Two times a day (BID) | RESPIRATORY_TRACT | Status: DC

## 2020-03-08 MED ORDER — MAGNESIUM SULFATE 2 GM/50ML IV SOLN
2.0000 g | Freq: Once | INTRAVENOUS | Status: AC
  Administered 2020-03-08: 2 g via INTRAVENOUS
  Filled 2020-03-08: qty 50

## 2020-03-08 MED ORDER — BUDESONIDE 0.25 MG/2ML IN SUSP
0.2500 mg | Freq: Two times a day (BID) | RESPIRATORY_TRACT | Status: DC
  Administered 2020-03-08 – 2020-03-14 (×12): 0.25 mg via RESPIRATORY_TRACT
  Filled 2020-03-08 (×12): qty 2

## 2020-03-08 MED ORDER — MONTELUKAST SODIUM 10 MG PO TABS
10.0000 mg | ORAL_TABLET | Freq: Every day | ORAL | Status: DC
  Administered 2020-03-08 – 2020-03-13 (×6): 10 mg via ORAL
  Filled 2020-03-08 (×6): qty 1

## 2020-03-08 MED ORDER — BUDESONIDE 0.25 MG/2ML IN SUSP: 0.2500 mg | Freq: Two times a day (BID) | RESPIRATORY_TRACT | Status: DC

## 2020-03-08 MED ORDER — INSULIN ASPART 100 UNIT/ML ~~LOC~~ SOLN: 0.0000 [IU] | Freq: Three times a day (TID) | SUBCUTANEOUS | Status: DC

## 2020-03-08 MED ORDER — GABAPENTIN 300 MG PO CAPS
300.0000 mg | ORAL_CAPSULE | Freq: Three times a day (TID) | ORAL | Status: DC
  Administered 2020-03-08 – 2020-03-14 (×17): 300 mg via ORAL
  Filled 2020-03-08 (×19): qty 1

## 2020-03-08 MED ORDER — ACETAMINOPHEN 325 MG PO TABS
650.0000 mg | ORAL_TABLET | Freq: Four times a day (QID) | ORAL | Status: DC | PRN
  Administered 2020-03-08: 650 mg via ORAL
  Filled 2020-03-08: qty 2

## 2020-03-08 MED ORDER — ALBUTEROL SULFATE (2.5 MG/3ML) 0.083% IN NEBU
2.5000 mg | INHALATION_SOLUTION | RESPIRATORY_TRACT | Status: DC | PRN
  Administered 2020-03-09: 2.5 mg via RESPIRATORY_TRACT
  Filled 2020-03-08: qty 3

## 2020-03-08 NOTE — Telephone Encounter (Signed)
Pt seen by me in ER/ nothing more needed

## 2020-03-08 NOTE — Telephone Encounter (Signed)
I called [facility] and left a secure vm for [facility] ([employer]) asking for a call back to discuss the call I got from [facility]. I provided the office number and hours.

## 2020-03-08 NOTE — Assessment & Plan Note (Addendum)
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

## 2020-03-08 NOTE — Telephone Encounter (Signed)
Natalia Leatherwood w/ [facility] returned my call and stated they are out of network with [facility] and were unable to get the EEG approved. She stated she had been in touch with the patient about this and even spoke with her today. The pt told Natalia Leatherwood she is in the hospital. I let Natalia Leatherwood know I would d/w Dr Lucia Gaskins. She verbalized appreciation.  I see here that patient is at [facility] with asthma exacerbation.

## 2020-03-08 NOTE — Telephone Encounter (Signed)
Spoke with pt and provided update on EEG status. Pt aware Dr Lucia Gaskins will order multiple EEGs for pt to complete here at office on difference days. The patient will call our office when she discharges from the hospital so we can get her scheduled. She verbalized appreciation for the call.

## 2020-03-08 NOTE — Assessment & Plan Note (Addendum)
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  FEV1 2.03 (82%)  Ratio 0.73  (multiple studies prior to this study all nl with nl exp portion of f/v loop and erratic insp loops s true plateau - 02/09/2020  Trial of stiolto and pred - 02/21/2020  After extensive coaching inhaler device,  effectiveness =    90% with [facility] and no longer makes her cough on inspiration so rec continue stiolto 2 pffs and taper off pred  - 02/21/2020 referred to allergy  and stopped singulair at pt request  - flared and back in ER 10/6 after stopping prednisone and ? 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   Main concern here is long term Adherence which is always the initial "prime suspect" and is a multilayered concern that requires a "trust but verify" approach in every patient - starting with knowing how to use medications, especially inhalers, correctly, keeping up with refills and understanding the fundamental difference between maintenance and prns vs those medications only taken for a very short course and then stopped and not refilled.  - return to office p d/c with all meds in hand using a trust but verify approach to confirm accurate Medication  Reconciliation The principal here is that until we are certain that the  patients are doing what  we've asked, it makes no sense to ask them to do more.

## 2020-03-08 NOTE — Progress Notes (Signed)
[facility]  EEG REPORT    NAME: Elizabeth Bautista   MRN: [id number]  DOB: 01/04/1967  GENDER: female AGE: 54yr     SERVICE DATE: 10/31/21  STUDY DURATION: 33 minutes  STUDY NUMBER: [id number]  ORDERING PHYSICIAN: Rasmussen, Ben Gregory, DO  EEG TECHNOLOGIST: R. Bastidas  R. EEG T.  LOCATION: [facility]  EEG SYSTEM: [employer]     CLINICAL HISTORY:  44yr old female who is having this EEG done for abnormal spell.    Other Data   MEDICATIONS:  Current Outpatient Medications   Medication Sig Dispense Refill    Albuterol (PROAIR HFA, PROVENTIL HFA, VENTOLIN HFA) 90 mcg/actuation inhaler Take 1-2 puffs by inhalation every 4 hours if needed for wheezing. 17 g 1    Allopurinol (ZYLOPRIM) 300 mg Tablet TAKE ONE TABLET BY MOUTH EVERY DAY 90 tablet 1    Aspirin 81 mg Chewable Tablet Take 1 tablet by mouth every day. 30 tablet 0    Atorvastatin (LIPITOR) 40 mg tablet TAKE ONE TABLET BY MOUTH AT BEDTIME 90 tablet 1    Azelaic Acid (FINACEA) 15 % Gel Apply to forehead, cheeks, nose, and chin twice daily 50 g 3    Azelastine Nasal (ASTELIN) 137 mcg (0.1 %) Spray Instill 2 sprays into EACH nostril 2 times daily. 30 mL 6    Chlorthalidone (THALITONE) 25 mg Tablet TAKE ONE TABLET BY MOUTH EVERY DAY 30 tablet 5    Diclofenac (VOLTAREN) 1 % Gel Apply 2 g to the affected area three times daily if needed. 100 g 1    FLOVENT HFA 110 mcg/actuation Inhaler Take 1 puff by inhalation every day. 12 g 1    Fluticasone (FLONASE) 50 mcg/actuation nasal spray Instill 1 spray into EACH nostril every day. Use after performing nasal saline irrigations 16 g 11    Gabapentin (NEURONTIN) 300 mg Capsule TAKE ONE CAPSULE BY MOUTH EVERY MORNING AND TAKE TWO CAPSULES BY MOUTH EVERY EVENING 90 capsule 5    Losartan (COZAAR) 100 mg tablet TAKE ONE TABLET BY MOUTH EVERY DAY 30 tablet 5    Pantoprazole (PROTONIX) 40 mg Delayed Release Tablet TAKE ONE TABLET BY MOUTH EVERY MORNING BEFORE A MEAL 90 tablet 0    Prazosin (MINIPRESS) 5 mg Capsule Take 1 capsule by mouth  every day at bedtime. 90 capsule 1    Trazodone (DESYREL) 50 mg Tablet TAKE ONE TABLET BY MOUTH AT BEDTIME AS NEEDED 30 tablet 5     No current facility-administered medications for this visit.       TECHNICAL DESCRIPTION:  This digital video EEG was performed using the standard 10-20 system of electrode placement and one channel of EKG.           EEG DESCRIPTION:    Clinical State: Awake  Background:  9-10 Hz; posterior head regions; symmetric, waxing and waning, reactive to eye opening and closure  Beta: 18-22 Hz; frontocentral, symmetric, waxing and waning        No stage II/N2 sleep was recorded    ACTIVATION:  Hyperventilation was performed for 3-minutes, producing no abnormal discharges  Photic stimulation was performed at frequencies ranging from 1-60 Hz, producing no abnormal discharges           IMPRESSION: Normal        CLINICAL CORRELATION:  This EEG in the awake state is normal. There are no focal, lateralized or epileptiform abnormalities.   No episodes of concern were captured.      Palak   Parikh, MD

## 2020-03-08 NOTE — Telephone Encounter (Signed)
EEG placed, thanks!

## 2020-03-08 NOTE — Progress Notes (Signed)
Respiratory contacted for PRN neb treatment needed r/t wheezing.

## 2020-03-08 NOTE — Consult Note (Signed)
Brief patient profile:  43 yobf  Quit smoking 06/2014  with allergies as child and new onset "asthma" during difficult delivery 9/2000which resulted in death of her babyand "never right since" despite quit smoking in 2016 much worse since summer 2020 req freq prednisone and seen [facility] 02/21/20 with clinical dx UACS/VCD  Rec maint stiolto 2 puff each am as others make her cough Try albuterol 15 min before an activity that you know would make you short of breath and see if it makes any difference and if makes none then don't take it after activity unless you can't catch your breath. 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 Shortness of Breath  . Respiratory Distress   Apparently "did fine" while on prednisone and then worse when stopped and did not restart as per written instructions above - when asked why didn't follow written action plan first recited comments I did not make re how to use pred, sab then said she did follow the action plan re prednisone use (was supposed to call be back immediately for uptick in saba need, did not do this either instead contacted pcp with request:"Can you look at his summary and see what could be a better plan for me"  - see message in chart  Dyspnea:  Progressive x 5 days PTA Cough: dry/ raspy never was better before attack SABA use: prior to flare: at least 2-3 x daily, did not follow recs re only taking if helped doe, which she said it did not   No obvious day to day or daytime variability or assoc excess/ purulent sputum or mucus plugs or hemoptysis or cp  or overt sinus or hb symptoms.    No unusual exposure hx or h/o  childhood pna/ asthma or knowledge of premature birth.  Current Allergies, Complete Past Medical History, Past Surgical History, Family History, and Social History were reviewed in [employer] record.  ROS  The following are not active complaints unless bolded Hoarseness, sore throat, dysphagia, dental problems, itching, sneezing,  nasal congestion or discharge of excess mucus or purulent secretions, ear ache,   fever, chills, sweats, unintended wt loss or wt gain, classically pleuritic or exertional cp,  orthopnea pnd or arm/hand swelling  or leg swelling, presyncope, palpitations, abdominal pain, anorexia, nausea, vomiting, diarrhea  or change in bowel habits or change in bladder habits, change in stools or change in urine, dysuria, hematuria,  rash, arthralgias, visual complaints, headache, numbness, weakness or ataxia or problems with walking or coordination,  change in mood or  memory.            (Not in an outpatient encounter)  Scheduled Meds: . azithromycin  250 mg Oral Daily  . busPIRone  7.5 mg Oral BID  . dextromethorphan  30 mg Oral BID  . enoxaparin (LOVENOX) injection  40 mg Subcutaneous Q24H  . escitalopram  10 mg Oral Daily  . ferrous sulfate  325 mg Oral BID  . hydrochlorothiazide  25 mg Oral Daily  . hydroxychloroquine  200 mg Oral BID  . insulin aspart  0-20 Units Subcutaneous TID WC  .  insulin aspart  0-5 Units Subcutaneous QHS  . insulin aspart  6 Units Subcutaneous TID WC  . ipratropium-albuterol  3 mL Nebulization Q6H  . methylPREDNISolone (SOLU-MEDROL) injection  40 mg Intravenous Q12H  . mometasone-formoterol  2 puff Inhalation BID  . pantoprazole  40 mg Oral Daily   Continuous Infusions: PRN Meds:.acetaminophen, chlorpheniramine-HYDROcodone, ibuprofen, ipratropium-albuterol         Objective:     BP 140/86   Pulse 74   Temp 98.2 F (36.8 C) (Oral)   Resp 20   Ht 5\' 5"  (1.651 m)   Wt 128.8 kg   LMP 02/15/2020   SpO2 100%    BMI 47.26 kg/m   SpO2: 100 % O2 Flow Rate (L/min): 0 L/min   Pt alert, approp nad @  45 degrees with "wheeze across the room" better with plm No jvd Neck supple Lungs with transmitted pseudowheeze and diminshed bs bilaterally RRR no s3 or or sign murmur Abd obese with nl  excursion  Extr warm with no edema or clubbing noted Neuro  Sensorium intact,  no apparent motor deficits / ? Belle affect     I personally reviewed images and agree with radiology impression as follows:  CXR:   Portable 11/06/19 Low lung volumes o/w unremarkable    Assessment   Asthma exacerbation Onset 2000 p death of baby during delivery with prominent vcd features   Spirometry 08/18/17.  FEV1 2.03 (82%)  Ratio 0.73  (multiple studies prior to this study all nl with nl exp portion of f/v loop and erratic insp loops s true plateau - 02/09/2020  Trial of stiolto and pred - 02/21/2020  After extensive coaching inhaler device,  effectiveness =    90% with [facility] and no longer makes her cough on inspiration so rec continue stiolto 2 pffs and taper off pred  - 02/21/2020 referred to allergy  and stopped singulair at pt request  - flared and back in ER 10/6 after stopping prednisone and ? 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   Main concern here is long term Adherence which is always the  initial "prime suspect" and is a multilayered concern that requires a "trust but verify" approach in every patient - starting with knowing how to use medications, especially inhalers, correctly, keeping up with refills and understanding the fundamental difference between maintenance and prns vs those medications only taken for a very short course and then stopped and not refilled.  - return to office p d/c with all meds in hand using a trust but verify approach to confirm accurate Medication  Reconciliation The principal here is that until we are certain that the  patients are doing what we've asked, it makes no sense to ask them to do more.      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      Sandrea Hughs, MD 03/08/2020

## 2020-03-08 NOTE — Telephone Encounter (Signed)
[facility]: [id number] (exp. 02/29/20 to 04/29/20) order faxed to [facility].they will reach out to the patient to schedule.  GI is not in network with the plan

## 2020-03-08 NOTE — Telephone Encounter (Signed)
Spoke with Dr Lucia Gaskins who declines peer to peer for home EEG. Alternatively we will have the pt call us upon d/c from hospital and then she will order multiple EEGs in office and patient will come on those separate days for EEG.

## 2020-03-08 NOTE — ED Notes (Signed)
Gave Tussionex at 1057. Am able to give meds 1 hour before and 1 hour after administration time

## 2020-03-08 NOTE — Progress Notes (Signed)
PROGRESS NOTE    Elizabeth Bautista  BDZ:[id number] DOB: 02-04-1976 DOA: 03/07/2020 PCP: Wilson Singer, MD   Brief Narrative:  Per HPI: Elizabeth Bautista is a 44 y.o. female with medical history significant for asthma, anemia, seizures, sleep apnea, type II DM, hypertension, GERD, rheumatoid arthritis who presents to the emergency department due to 2-week onset of shortness of breath which worsened within last 4 days.  Patient complained of increased shortness of breath and wheezing which started about 2 weeks ago, she states that she followed up with her pulmonologist who prescribed her with Stiolto and prednisone with improved symptoms, she completed the prednisone about 4-5 days ago and shortness of breath increased and progressively worsened since last 4 days.  This was associated with cough with occasional production of clear mucus and pleuritic chest pain (due to coughing).  Home breathing treatment with nebulizer did not alleviate the patient's symptoms.  Patient has a history of sleep apnea and she uses CPAP at night, but she states that the CPAP became nonfunctional about 3 weeks ago and she was in the process of obtaining another one.  She had a virtual consult with her PCP today and patient was referred to the emergency department for further evaluation and management.  She denies fever, chills, headache, nausea, vomiting or diarrhea.  She has never been intubated due to asthma exacerbation. Patient had similar presentation in May of this year (5/18-5/23) and she was referred to a pulmonologist on discharge.  This is the third hospital admission for asthma exacerbation history by patient.  10/7: Patient has been admitted with acute asthma exacerbation and was noted to previously in outpatient setting have upper airway cough syndrome.  This has been ongoing for the last 2 weeks and she has failed outpatient treatment with prednisone.  She is currently on BiPAP and still actively wheezing and having  difficulty breathing, although this is minimally improved.  Started on duo nebs, Solu-Medrol, and azithromycin.  Chest x-ray and Covid testing negative.  Pulmonology consulted for assistance in management.  Assessment & Plan:   Principal Problem:   Asthma exacerbation Active Problems:   Sleep apnea   Depression   Rheumatoid arthritis (HCC)   Class 3 severe obesity with serious comorbidity and body mass index (BMI) of 45.0 to 49.9 in adult [facility])   Hypertension   Anemia   GERD (gastroesophageal reflux disease)   Anxiety   History of seizures   Hypokalemia   Acute asthma exacerbation Currently with increased work of breathing on BiPAP requiring stepdown unit monitoring Consultation to pulmonology appreciated for further assistance in management Continue duo nebs, Solu-Medrol, azithromycin. Continue Protonix to prevent steroid-induced ulcer Continue incentive spirometry and flutter valve Continue on dextromethorphan, Tussionex for cough  Class 3 severe obesity and obstructive sleep apnea in the setting of asthma exacerbation Patient states that her CPAP became nonfunctional 3 weeks ago, symptoms started about 2 weeks ago.  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 every 6 as needed for mild pain Continue  ibuprofen 600 mg p.o. every 8 hours as needed for moderate pain  History of seizures Patient states that she is currently not on any medication Last seizure episode was 2 weeks ago, she states that she has a pending outpatient neurological consult for further evaluation Monitor for seizures and treat accordingly Continue fall precaution and neurochecks   DVT prophylaxis:Lovenox Code Status: Full code Family Communication: None at bedside, patient will call Disposition Plan:  Status is: Observation  The patient will require care spanning > 2 midnights and should be moved to inpatient because: IV treatments appropriate due to intensity of illness or inability to take PO and Inpatient level of care appropriate due to severity of illness  Dispo: The patient is from: Home              Anticipated d/c is to: Home              Anticipated d/c date is: 2 days              Patient currently is not medically stable to d/c.  Patient still actively wheezing with shortness of breath and requiring BiPAP.   Consultants:   Pulmonology  Procedures:   See below  Antimicrobials:  Anti-infectives (From admission, onward)   Start     Dose/Rate Route Frequency Ordered Stop   03/08/20 1000  azithromycin (ZITHROMAX) tablet 250 mg       "Followed by" Linked Group Details   250 mg Oral Daily 03/07/20 2055 03/12/20 0959   03/07/20 2200  hydroxychloroquine (PLAQUENIL) tablet 200 mg        200 mg Oral 2 times daily 03/07/20 2028     03/07/20 2130  azithromycin (ZITHROMAX) tablet 500 mg       "Followed by" Linked Group Details   500 mg Oral Daily 03/07/20 2055 03/07/20 2120       Subjective: Patient seen and evaluated today with some improvement in her shortness of breath.  She denies any chest tightness, but continues to have wheezing and cough that is nonproductive.  She remains on BiPAP this morning.  Objective: Vitals:   03/08/20 0430 03/08/20 0500 03/08/20 0530 03/08/20 0600  BP: 140/87  134/86 135/88 140/81  Pulse: 85 81 77 78  Resp: 15 20 19 20   Temp:      TempSrc:      SpO2: 98% 98% 99% 100%  Weight:      Height:        Intake/Output Summary (Last 24 hours) at 03/08/2020 0706 Last data filed at 03/07/2020 1655 Gross per 24 hour  Intake 50 ml  Output --  Net 50 ml   Filed Weights   03/07/20 1532  Weight: 128.8 kg    Examination:  General exam: Appears calm and comfortable, morbid obesity  Respiratory system: Bilateral wheezing. Respiratory effort normal.  Currently on BiPAP FiO2 28% Cardiovascular system: S1 & S2 heard, RRR.  Gastrointestinal system: Abdomen is nondistended, soft and nontender.  Obese. Central nervous system: Alert and oriented. No focal neurological deficits. Extremities: No significant edema Skin: No rashes, lesions or ulcers Psychiatry: Judgement and insight appear normal. Mood & affect appropriate.     Data Reviewed: I have personally reviewed following labs and imaging studies  CBC: Recent Labs  Lab 03/07/20 1536 03/08/20 0432  WBC 9.7 13.7*  NEUTROABS 6.6  --   HGB 10.8* 10.6*  HCT 35.2* 34.9*  MCV 71.7* 72.3*  PLT 284 281   Basic Metabolic Panel: Recent Labs  Lab 03/07/20 1536 03/08/20 0432  NA 134* 132*  K 3.3* 4.5  CL 100 101  CO2 25 21*  GLUCOSE 109* 262*  BUN 8 10  CREATININE 0.71 0.70  CALCIUM 8.7* 9.0   GFR: Estimated Creatinine Clearance: 120.1 mL/min (by C-G formula based on SCr of 0.7 mg/dL). Liver Function Tests: Recent Labs  Lab 03/07/20 1536 03/08/20 0432  AST 16 16  ALT 18 19  ALKPHOS 75 74  BILITOT 0.4 0.4  PROT 7.3 7.2  ALBUMIN 3.4* 3.4*   No results for input(s): LIPASE, AMYLASE in the last 168 hours. No results for input(s): AMMONIA in the last 168 hours. Coagulation Profile: Recent Labs  Lab 03/08/20 0432  INR 1.1   Cardiac Enzymes: No results for input(s): CKTOTAL, CKMB, CKMBINDEX, TROPONINI in the last 168 hours. BNP (last 3 results) No results for input(s): PROBNP in  the last 8760 hours. HbA1C: No results for input(s): HGBA1C in the last 72 hours. CBG: No results for input(s): GLUCAP in the last 168 hours. Lipid Profile: No results for input(s): CHOL, HDL, LDLCALC, TRIG, CHOLHDL, LDLDIRECT in the last 72 hours. Thyroid Function Tests: No results for input(s): TSH, T4TOTAL, FREET4, T3FREE, THYROIDAB in the last 72 hours. Anemia Panel: No results for input(s): VITAMINB12, FOLATE, FERRITIN, TIBC, IRON, RETICCTPCT in the last 72 hours. Sepsis Labs: No results for input(s): PROCALCITON, LATICACIDVEN in the last 168 hours.  Recent Results (from the past 240 hour(s))  Resp Panel by RT PCR (RSV, Flu A&B, Covid) - Nasopharyngeal Swab     Status: None   Collection Time: 03/07/20  3:40 PM   Specimen: Nasopharyngeal Swab  Result Value Ref Range Status   SARS Coronavirus 2 by RT PCR NEGATIVE NEGATIVE Final    Comment: (NOTE) SARS-CoV-2 target nucleic acids are NOT DETECTED.  The SARS-CoV-2 RNA is generally detectable in upper respiratoy specimens during the acute phase of infection. The lowest concentration of SARS-CoV-2 viral copies this assay can detect is 131 copies/mL. A negative result does not preclude SARS-Cov-2 infection and should not be used as the sole basis for treatment or other patient management decisions. A negative result may occur with  improper specimen collection/handling, submission of specimen other than nasopharyngeal swab, presence of viral mutation(s) within the areas targeted by this assay, and inadequate number of viral copies (<131 copies/mL). A negative result must be combined with clinical observations, patient history, and epidemiological information. The expected result is Negative.  Fact Sheet for Patients:  https://www.moore.com/  Fact Sheet for Healthcare Providers:  https://www.young.biz/  This test is no t yet approved or cleared by the [place] FDA and  has been  authorized for detection and/or diagnosis of SARS-CoV-2 by FDA under an Emergency Use Authorization (EUA). This EUA will remain  in effect (meaning this test can be used) for the duration of the COVID-19 declaration under Section 564(b)(1) of the Act, 21 U.S.C. section 360bbb-3(b)(1), unless the authorization is terminated or revoked sooner.     Influenza A by PCR NEGATIVE NEGATIVE Final   Influenza B by PCR NEGATIVE NEGATIVE Final    Comment: (NOTE) The Xpert Xpress SARS-CoV-2/FLU/RSV assay is intended as an aid in  the diagnosis of influenza from Nasopharyngeal swab specimens and  should not be used as a sole basis for treatment. Nasal washings and  aspirates are unacceptable for Xpert Xpress SARS-CoV-2/FLU/RSV  testing.  Fact Sheet for Patients: https://www.moore.com/  Fact Sheet for Healthcare Providers: https://www.young.biz/  This test is not yet approved or cleared by the [place] FDA and  has been authorized for detection and/or diagnosis of SARS-CoV-2 by  FDA under an Emergency Use Authorization (EUA). This EUA will remain  in effect (meaning this test can be used) for the duration of the  Covid-19 declaration under Section 564(b)(1) of the Act, 21  U.S.C. section 360bbb-3(b)(1), unless the authorization is  terminated or revoked.    Respiratory Syncytial Virus by PCR NEGATIVE NEGATIVE Final    Comment: (NOTE) Fact Sheet for Patients: https://www.moore.com/  Fact Sheet for Healthcare Providers: https://www.young.biz/  This test is not yet approved or cleared by the [place] FDA and  has been authorized for detection and/or diagnosis of SARS-CoV-2 by  FDA under an Emergency Use Authorization (EUA). This EUA will remain  in effect (meaning this test can be used) for the duration of the  COVID-19 declaration under Section 564(b)(1) of the Act, 21 U.S.C.  section 360bbb-3(b)(1),  unless the authorization is terminated or  revoked. Performed at [facility], [address], [place], [place] [address]          Radiology Studies: DG Chest Portable 1 View  Result Date: 03/07/2020 CLINICAL DATA:  Cough, chest pain. EXAM: PORTABLE CHEST 1 VIEW COMPARISON:  Oct 22, 2019 FINDINGS: The heart size and mediastinal contours are within normal limits. No consolidation. No visible pleural effusions or pneumothorax. Low lung volumes. The visualized skeletal structures are unremarkable. IMPRESSION: No active disease. Electronically Signed   By: Feliberto Harts MD   On: 03/07/2020 16:20        Scheduled Meds: . azithromycin  250 mg Oral Daily  . busPIRone  7.5 mg Oral BID  . dextromethorphan  30 mg Oral BID  . enoxaparin (LOVENOX) injection  40 mg Subcutaneous Q24H  . escitalopram  10 mg Oral Daily  . ferrous sulfate  325 mg Oral BID  . hydrochlorothiazide  25 mg Oral Daily  . hydroxychloroquine  200 mg Oral BID  . insulin aspart  0-15 Units Subcutaneous TID WC  . insulin aspart  0-5 Units Subcutaneous QHS  . ipratropium-albuterol  3 mL Nebulization Q6H  . methylPREDNISolone (SOLU-MEDROL) injection  40 mg Intravenous Q12H  . mometasone-formoterol  2 puff Inhalation BID  . pantoprazole  40 mg Oral Daily     LOS: 0 days    Time spent: 35 minutes    Malosi Hemstreet Hoover Brunette, DO [facility]  If 7PM-7AM, please contact night-coverage [email or web address] 03/08/2020, 7:06 AM

## 2020-03-09 DIAGNOSIS — R058 Other specified cough: Secondary | ICD-10-CM | POA: Diagnosis not present

## 2020-03-09 DIAGNOSIS — J45901 Unspecified asthma with (acute) exacerbation: Secondary | ICD-10-CM | POA: Diagnosis not present

## 2020-03-09 DIAGNOSIS — K219 Gastro-esophageal reflux disease without esophagitis: Secondary | ICD-10-CM | POA: Diagnosis not present

## 2020-03-09 DIAGNOSIS — J4521 Mild intermittent asthma with (acute) exacerbation: Secondary | ICD-10-CM | POA: Diagnosis not present

## 2020-03-09 LAB — GLUCOSE, CAPILLARY
Glucose-Capillary: 244 mg/dL — ABNORMAL HIGH (ref 70–99)
Glucose-Capillary: 143 mg/dL — ABNORMAL HIGH (ref 70–99)
Glucose-Capillary: 241 mg/dL — ABNORMAL HIGH (ref 70–99)
Glucose-Capillary: 222 mg/dL — ABNORMAL HIGH (ref 70–99)

## 2020-03-09 LAB — BASIC METABOLIC PANEL
Anion gap: 8 (ref 5–15)
BUN: 10 mg/dL (ref 6–20)
CO2: 23 mmol/L (ref 22–32)
Calcium: 8.7 mg/dL — ABNORMAL LOW (ref 8.9–10.3)
Chloride: 102 mmol/L (ref 98–111)
Creatinine, Ser: 0.72 mg/dL (ref 0.44–1.00)
GFR calc non Af Amer: >60 mL/min (ref >60)
Glucose, Bld: 270 mg/dL — ABNORMAL HIGH (ref 70–99)
Potassium: 4.9 mmol/L (ref 3.5–5.1)
Sodium: 133 mmol/L — ABNORMAL LOW (ref 135–145)

## 2020-03-09 LAB — MAGNESIUM: Magnesium: 2.3 mg/dL (ref 1.7–2.4)

## 2020-03-09 LAB — CBC
HCT: 36 % (ref 36.0–46.0)
Hemoglobin: 10.5 g/dL — ABNORMAL LOW (ref 12.0–15.0)
MCH: 21.3 pg — ABNORMAL LOW (ref 26.0–34.0)
MCHC: 29.2 g/dL — ABNORMAL LOW (ref 30.0–36.0)
MCV: 73.2 fL — ABNORMAL LOW (ref 80.0–100.0)
Platelets: 299 10*3/uL (ref 150–400)
RBC: 4.92 MIL/uL (ref 3.87–5.11)
RDW: 17.2 % — ABNORMAL HIGH (ref 11.5–15.5)
WBC: 20.3 10*3/uL — ABNORMAL HIGH (ref 4.0–10.5)
nRBC: 0 % (ref 0.0–0.2)

## 2020-03-09 MED ORDER — TRAMADOL HCL 50 MG PO TABS
50.0000 mg | ORAL_TABLET | Freq: Four times a day (QID) | ORAL | Status: DC | PRN
  Administered 2020-03-09 (×2): 50 mg via ORAL
  Filled 2020-03-09 (×2): qty 1

## 2020-03-09 MED ORDER — ACETAMINOPHEN 325 MG PO TABS: 650.0000 mg | ORAL_TABLET | Freq: Four times a day (QID) | ORAL | Status: DC | PRN

## 2020-03-09 MED ORDER — INSULIN ASPART 100 UNIT/ML ~~LOC~~ SOLN
10.0000 [IU] | Freq: Three times a day (TID) | SUBCUTANEOUS | Status: DC
  Administered 2020-03-09 – 2020-03-13 (×7): 10 [IU] via SUBCUTANEOUS

## 2020-03-09 NOTE — Progress Notes (Addendum)
Brief patient profile:  43 yobf  Quit smoking 06/2014  with allergies as child and new onset "asthma" during difficult delivery 9/2000which resulted in death of her babyand "never right since" despite quit smoking in 2016 much worse since summer 2020 req freq prednisone and seen [place] pulmonary office 02/21/20 with clinical dx UACS/VCD  Rec maint stiolto 2 puff each am as others make her cough Try albuterol 15 min before an activity that you know would make you short of breath and see if it makes any difference and if makes none then don't take it after activity unless you can't catch your breath. 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 Shortness of Breath    Cough    Dyspnea:  Sleeping comfortably on my arrival, no wheeze until woke her up then both cough and audible upper airway wheeze  Cough: dry harsh to point of almost choking  Sleeping: fine until I wok her up              (Not in an outpatient encounter)  Scheduled Meds: . arformoterol  15 mcg Nebulization BID  . azithromycin  250 mg Oral Daily  . budesonide (PULMICORT) nebulizer solution  0.25 mg Nebulization BID  . busPIRone  7.5 mg Oral BID  . chlorpheniramine-HYDROcodone  5 mL Oral Q6H  . enoxaparin (LOVENOX) injection  40 mg Subcutaneous Q24H  . escitalopram  10 mg Oral Daily  . ferrous sulfate  325 mg Oral BID  . gabapentin  300 mg Oral TID  . hydrochlorothiazide  25 mg Oral Daily  . hydroxychloroquine  200 mg Oral BID  . insulin aspart  0-20 Units Subcutaneous TID WC  . insulin aspart  0-5 Units Subcutaneous QHS  . insulin aspart  10 Units Subcutaneous TID WC  . methylPREDNISolone (SOLU-MEDROL) injection  80 mg Intravenous  Q12H  . montelukast  10 mg Oral QHS  . pantoprazole  40 mg Oral BID AC   Continuous Infusions: PRN Meds:.acetaminophen, albuterol, cyclobenzaprine, ibuprofen         Objective:     BP (!) 143/87 (BP Location: Left Arm)   Pulse 68   Temp 97.8 F (36.6 C) (Oral)   Resp 18   Ht 5\' 5"  (1.651 m)   Wt 129.5 kg   LMP 02/15/2020   SpO2 99%   BMI 47.51 kg/m   SpO2: 99 % O2 Flow Rate (L/min): 0 L/min   Pt alert, approp nad @ 45 degrees  Oropharynx clear,  mucosa nl Neck supple Lungs with  clear bilaterally - just transmitted upper airway noise  RRR no s3 or or sign murmur Abd obese with nl excursion  Extr warm with no edema or clubbing noted Neuro  Sensorium intact,  no apparent motor deficits           Assessment   Asthma exacerbation Onset 2000 p death of baby during delivery with prominent vcd features   Spirometry 08/18/17.  FEV1 2.03 (82%)  Ratio 0.73  (multiple studies prior to this study all nl with nl exp portion of f/v loop and erratic insp loops s true plateau -  02/09/2020  Trial of stiolto and pred - 02/21/2020  After extensive coaching inhaler device,  effectiveness =    90% with SMI and no longer makes her cough on inspiration so rec continue stiolto 2 pffs and taper off pred  - 02/21/2020 referred to allergy  and stopped singulair at pt request  - flared and back in ER 10/6 after stopping prednisone and ? Restarting (depending on who asks the question) along with over use of saba even when "better"   The low lung volumes are the main clue this is not typical asthma as is the unusual hx that she had no breathing problems until very traumatic childbirth in September 2000 though she does appear to have an underlying allergic mech driving some of her symptoms with high EOS and response to prednisone previously reported   >>> rx with solumedrol/ nebs/ and d/c back on stiolto 2 each am since only device did not make her cough (vs budesonide 0.25 mg  with performist per  neb if insurance covers) and pred 20 mg daily until 100% back to baseline then 10 mg per day unitl seen in pulmonary or allergy clinic.     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       Sandrea Hughs, MD 03/09/2020

## 2020-03-09 NOTE — Progress Notes (Signed)
PROGRESS NOTE    Elizabeth Bautista  WUJ:[id number] DOB: [birth year] DOA: 03/07/2020 PCP: Wilson Singer, MD   Brief Narrative:  Per HPI: Elizabeth Reneis a 44 y.o.femalewith medical history significant forasthma, anemia, seizures, sleep apnea, type II DM, hypertension, GERD, rheumatoid arthritis who presents to the emergency department due to 2-week onset of shortness of breath which worsened within last 4 days. Patient complained of increased shortness of breath and wheezing which started about 2 weeks ago, she states that she followed up with her pulmonologist who prescribed her with Stiolto and prednisone with improved symptoms, she completed the prednisone about 4-5 days ago and shortness of breath increased and progressively worsenedsince last 4 days. This was associated with cough with occasional production of clear mucus and pleuritic chest pain (due to coughing). Home breathing treatment with nebulizer did not alleviate the patient's symptoms. Patient has a history of sleep apnea and she uses CPAP at night, but she states that the CPAP became nonfunctional about 3 weeks ago and she was in the process of obtaining another one.She had a virtual consult with her PCP today and patient was referred to the emergency department for further evaluation and management. She denies fever, chills, headache, nausea, vomiting or diarrhea. She has never been intubated due to asthma exacerbation. Patient had similar presentation in May of this year (5/18-5/23)and she was referred to a pulmonologist on discharge. This is the third hospital admission for asthma exacerbation history by patient.  10/7: Patient has been admitted with acute asthma exacerbation and was noted to previously in outpatient setting have upper airway cough syndrome.  This has been ongoing for the last 2 weeks and she has failed outpatient treatment with prednisone.  She is currently on BiPAP and still actively wheezing and having  difficulty breathing, although this is minimally improved.  Started on duo nebs, Solu-Medrol, and azithromycin.  Chest x-ray and Covid testing negative.  Pulmonology consulted for assistance in management.  10/8: Patient continues to have some ongoing shortness of breath, but this is improving along with her cough.  She is still not quite at baseline and is still requiring ongoing breathing treatments and IV steroids.   Assessment & Plan:   Principal Problem:   Asthma exacerbation Active Problems:   Sleep apnea   Depression   Rheumatoid arthritis (HCC)   Class 3 severe obesity with serious comorbidity and body mass index (BMI) of 45.0 to 49.9 in adult [facility])   Hypertension   Anemia   Upper airway cough syndrome   GERD (gastroesophageal reflux disease)   Anxiety   History of seizures   Hypokalemia   Acute asthma exacerbation   Acute asthma exacerbation with upper airway cough syndrome -improving Currently with increased work of breathing on BiPAP requiring stepdown unit monitoring Consultation to pulmonology with assistance appreciated Continue duo nebs, Solu-Medrol, azithromycin. -Continue on gabapentin as prescribed Continue Protonix to prevent steroid-induced ulcer Continue incentive spirometry and flutter valve Continue Tussionex  Class3severe obesity and obstructive sleep apnea in the setting of asthma exacerbation Patient states that her CPAP became nonfunctional 3 weeks ago, symptoms started about 2 weeks ago. 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 every 6 as needed for mild pain Continue ibuprofen 600 mg p.o. every 8 hours as needed for moderate pain  History of seizures Patient states that she is currently not on any medication Last seizure episode was 2 weeks ago,she states that she has a pending outpatient neurological consult for further evaluation Monitor for seizures and treat accordingly Continue fall precaution and neurochecks   DVT prophylaxis:Lovenox Code Status: Full code Family Communication: None at bedside, patient will call Disposition Plan:  Status is: Inpatient  Remains inpatient appropriate because:IV treatments appropriate due to intensity of illness or inability to take PO and Inpatient level of care appropriate due to severity of illness   Dispo: The patient is from: Home              Anticipated d/c is to: Home              Anticipated d/c date is: 1 day              Patient currently is not medically stable to d/c.  Patient still requiring ongoing breathing treatments as well as IV steroids.  Consultants:   Pulmonology  Procedures:   See below  Antimicrobials:  Anti-infectives (From admission, onward)   Start     Dose/Rate Route Frequency Ordered Stop   03/08/20 1000  azithromycin (ZITHROMAX) tablet 250 mg       "Followed by" Linked Group Details   250 mg Oral Daily 03/07/20 2055 03/12/20 0959   03/07/20 2200  hydroxychloroquine (PLAQUENIL) tablet 200 mg        200 mg Oral 2 times daily 03/07/20 2028     03/07/20 2130  azithromycin (ZITHROMAX) tablet 500 mg       "Followed by" Linked Group Details   500 mg Oral Daily 03/07/20 2055 03/07/20 2120       Subjective: Patient seen and  evaluated today with improving shortness of breath and cough noted, she is still not quite at baseline still has some chest tightness and wheezing.  Objective: Vitals:   03/09/20 0200 03/09/20 0534 03/09/20 0546 03/09/20 0744  BP: 131/84  (!) 143/87   Pulse: 75  68   Resp: 18  18 18   Temp: 98.1 F (36.7 C)  97.8 F (36.6 C)   TempSrc: Oral  Oral   SpO2: 98% 97% 96% 99%  Weight:      Height:        Intake/Output Summary (Last 24 hours) at 03/09/2020 1058 Last data filed at 03/09/2020 0900 Gross per 24 hour  Intake 770 ml  Output 1300 ml  Net -530 ml   Filed Weights   03/07/20 1532 03/08/20 1623  Weight: 128.8 kg 129.5 kg    Examination:  General exam: Appears calm and comfortable, obese Respiratory system: mild wheezing bilaterally. Respiratory effort normal.  Currently on CPAP. Cardiovascular system: S1 & S2 heard, RRR. Gastrointestinal system: Abdomen is nondistended, soft and nontender. Central nervous system: Alert and oriented. No focal neurological deficits. Extremities: No edema Skin: No rashes, lesions or ulcers Psychiatry: Judgement and insight appear normal. Mood & affect appropriate.     Data Reviewed: I have personally reviewed following labs and imaging studies  CBC: Recent Labs  Lab 03/07/20  1536 03/08/20 0432 03/09/20 0633  WBC 9.7 13.7* 20.3*  NEUTROABS 6.6  --   --   HGB 10.8* 10.6* 10.5*  HCT 35.2* 34.9* 36.0  MCV 71.7* 72.3* 73.2*  PLT 284 281 299   Basic Metabolic Panel: Recent Labs  Lab 03/07/20 1536 03/08/20 0432 03/09/20 0633  NA 134* 132* 133*  K 3.3* 4.5 4.9  CL 100 101 102  CO2 25 21* 23  GLUCOSE 109* 262* 270*  BUN 8 10 10   CREATININE 0.71 0.70 0.72  CALCIUM 8.7* 9.0 8.7*  MG  --  2.0 2.3   GFR: Estimated Creatinine Clearance: 120.6 mL/min (by C-G formula based on SCr of 0.72 mg/dL). Liver Function Tests: Recent Labs  Lab 03/07/20 1536 03/08/20 0432  AST 16 16  ALT 18 19  ALKPHOS 75 74  BILITOT 0.4 0.4  PROT  7.3 7.2  ALBUMIN 3.4* 3.4*   No results for input(s): LIPASE, AMYLASE in the last 168 hours. No results for input(s): AMMONIA in the last 168 hours. Coagulation Profile: Recent Labs  Lab 03/08/20 0432  INR 1.1   Cardiac Enzymes: No results for input(s): CKTOTAL, CKMB, CKMBINDEX, TROPONINI in the last 168 hours. BNP (last 3 results) No results for input(s): PROBNP in the last 8760 hours. HbA1C: No results for input(s): HGBA1C in the last 72 hours. CBG: Recent Labs  Lab 03/08/20 0834 03/08/20 1146 03/08/20 1613 03/08/20 2133 03/09/20 0734  GLUCAP 183* 193* 149* 217* 244*   Lipid Profile: No results for input(s): CHOL, HDL, LDLCALC, TRIG, CHOLHDL, LDLDIRECT in the last 72 hours. Thyroid Function Tests: No results for input(s): TSH, T4TOTAL, FREET4, T3FREE, THYROIDAB in the last 72 hours. Anemia Panel: No results for input(s): VITAMINB12, FOLATE, FERRITIN, TIBC, IRON, RETICCTPCT in the last 72 hours. Sepsis Labs: No results for input(s): PROCALCITON, LATICACIDVEN in the last 168 hours.  Recent Results (from the past 240 hour(s))  Resp Panel by RT PCR (RSV, Flu A&B, Covid) - Nasopharyngeal Swab     Status: None   Collection Time: 03/07/20  3:40 PM   Specimen: Nasopharyngeal Swab  Result Value Ref Range Status   SARS Coronavirus 2 by RT PCR NEGATIVE NEGATIVE Final    Comment: (NOTE) SARS-CoV-2 target nucleic acids are NOT DETECTED.  The SARS-CoV-2 RNA is generally detectable in upper respiratoy specimens during the acute phase of infection. The lowest concentration of SARS-CoV-2 viral copies this assay can detect is 131 copies/mL. A negative result does not preclude SARS-Cov-2 infection and should not be used as the sole basis for treatment or other patient management decisions. A negative result may occur with  improper specimen collection/handling, submission of specimen other than nasopharyngeal swab, presence of viral mutation(s) within the areas targeted by this  assay, and inadequate number of viral copies (<131 copies/mL). A negative result must be combined with clinical observations, patient history, and epidemiological information. The expected result is Negative.  Fact Sheet for Patients:  05/07/20  Fact Sheet for Healthcare Providers:  https://www.moore.com/  This test is no t yet approved or cleared by the https://www.young.biz/ FDA and  has been authorized for detection and/or diagnosis of SARS-CoV-2 by FDA under an Emergency Use Authorization (EUA). This EUA will remain  in effect (meaning this test can be used) for the duration of the COVID-19 declaration under Section 564(b)(1) of the Act, 21 U.S.C. section 360bbb-3(b)(1), unless the authorization is terminated or revoked sooner.     Influenza A by PCR NEGATIVE NEGATIVE Final   Influenza B  by PCR NEGATIVE NEGATIVE Final    Comment: (NOTE) The Xpert Xpress SARS-CoV-2/FLU/RSV assay is intended as an aid in  the diagnosis of influenza from Nasopharyngeal swab specimens and  should not be used as a sole basis for treatment. Nasal washings and  aspirates are unacceptable for Xpert Xpress SARS-CoV-2/FLU/RSV  testing.  Fact Sheet for Patients: https://www.moore.com/  Fact Sheet for Healthcare Providers: https://www.young.biz/  This test is not yet approved or cleared by the [place] FDA and  has been authorized for detection and/or diagnosis of SARS-CoV-2 by  FDA under an Emergency Use Authorization (EUA). This EUA will remain  in effect (meaning this test can be used) for the duration of the  Covid-19 declaration under Section 564(b)(1) of the Act, 21  U.S.C. section 360bbb-3(b)(1), unless the authorization is  terminated or revoked.    Respiratory Syncytial Virus by PCR NEGATIVE NEGATIVE Final    Comment: (NOTE) Fact Sheet for Patients: https://www.moore.com/  Fact  Sheet for Healthcare Providers: https://www.young.biz/  This test is not yet approved or cleared by the [place] FDA and  has been authorized for detection and/or diagnosis of SARS-CoV-2 by  FDA under an Emergency Use Authorization (EUA). This EUA will remain  in effect (meaning this test can be used) for the duration of the  COVID-19 declaration under Section 564(b)(1) of the Act, 21 U.S.C.  section 360bbb-3(b)(1), unless the authorization is terminated or  revoked. Performed at [facility], [address], [place], [place] [address]          Radiology Studies: DG Chest Portable 1 View  Result Date: 03/07/2020 CLINICAL DATA:  Cough, chest pain. EXAM: PORTABLE CHEST 1 VIEW COMPARISON:  Oct 22, 2019 FINDINGS: The heart size and mediastinal contours are within normal limits. No consolidation. No visible pleural effusions or pneumothorax. Low lung volumes. The visualized skeletal structures are unremarkable. IMPRESSION: No active disease. Electronically Signed   By: Feliberto Harts MD   On: 03/07/2020 16:20        Scheduled Meds: . arformoterol  15 mcg Nebulization BID  . azithromycin  250 mg Oral Daily  . budesonide (PULMICORT) nebulizer solution  0.25 mg Nebulization BID  . busPIRone  7.5 mg Oral BID  . chlorpheniramine-HYDROcodone  5 mL Oral Q6H  . enoxaparin (LOVENOX) injection  40 mg Subcutaneous Q24H  . escitalopram  10 mg Oral Daily  . ferrous sulfate  325 mg Oral BID  . gabapentin  300 mg Oral TID  . hydrochlorothiazide  25 mg Oral Daily  . hydroxychloroquine  200 mg Oral BID  . insulin aspart  0-20 Units Subcutaneous TID WC  . insulin aspart  0-5 Units Subcutaneous QHS  . insulin aspart  10 Units Subcutaneous TID WC  . methylPREDNISolone (SOLU-MEDROL) injection  80 mg Intravenous Q12H  . montelukast  10 mg Oral QHS  . pantoprazole  40 mg Oral BID AC    LOS: 1 day    Time spent: 30 minutes    Jaylene Arrowood Hoover Brunette, DO [facility]  If 7PM-7AM, please contact night-coverage [email or web address] 03/09/2020, 10:58 AM

## 2020-03-10 ENCOUNTER — Inpatient Hospital Stay (HOSPITAL_COMMUNITY): Payer: Medicaid Other

## 2020-03-10 LAB — GLUCOSE, CAPILLARY
Glucose-Capillary: 187 mg/dL — ABNORMAL HIGH (ref 70–99)
Glucose-Capillary: 158 mg/dL — ABNORMAL HIGH (ref 70–99)
Glucose-Capillary: 227 mg/dL — ABNORMAL HIGH (ref 70–99)
Glucose-Capillary: 155 mg/dL — ABNORMAL HIGH (ref 70–99)

## 2020-03-10 LAB — CBC
HCT: 38.7 % (ref 36.0–46.0)
Hemoglobin: 11.3 g/dL — ABNORMAL LOW (ref 12.0–15.0)
MCH: 21.4 pg — ABNORMAL LOW (ref 26.0–34.0)
MCHC: 29.2 g/dL — ABNORMAL LOW (ref 30.0–36.0)
MCV: 73.3 fL — ABNORMAL LOW (ref 80.0–100.0)
Platelets: 330 10*3/uL (ref 150–400)
RBC: 5.28 MIL/uL — ABNORMAL HIGH (ref 3.87–5.11)
RDW: 17.2 % — ABNORMAL HIGH (ref 11.5–15.5)
WBC: 20.4 10*3/uL — ABNORMAL HIGH (ref 4.0–10.5)
nRBC: 0 % (ref 0.0–0.2)

## 2020-03-10 MED ORDER — LORAZEPAM 2 MG/ML IJ SOLN
2.0000 mg | Freq: Once | INTRAMUSCULAR | Status: AC
  Administered 2020-03-10: 2 mg via INTRAVENOUS

## 2020-03-10 MED ORDER — LORAZEPAM 2 MG/ML IJ SOLN
2.0000 mg | INTRAMUSCULAR | Status: DC | PRN
  Administered 2020-03-10 – 2020-03-13 (×5): 2 mg via INTRAVENOUS
  Filled 2020-03-10 (×5): qty 1

## 2020-03-10 MED ORDER — LEVETIRACETAM IN NACL 500 MG/100ML IV SOLN: 500.0000 mg | Freq: Two times a day (BID) | INTRAVENOUS | Status: DC

## 2020-03-10 MED ORDER — HYDROCODONE-ACETAMINOPHEN 5-325 MG PO TABS
1.0000 | ORAL_TABLET | Freq: Four times a day (QID) | ORAL | Status: DC | PRN
  Administered 2020-03-11 – 2020-03-12 (×2): 1 via ORAL
  Filled 2020-03-10 (×2): qty 1

## 2020-03-10 MED ORDER — LEVETIRACETAM IN NACL 500 MG/100ML IV SOLN
500.0000 mg | Freq: Two times a day (BID) | INTRAVENOUS | Status: DC
  Administered 2020-03-10 – 2020-03-14 (×9): 500 mg via INTRAVENOUS
  Filled 2020-03-10 (×9): qty 100

## 2020-03-10 MED ORDER — LEVETIRACETAM IN NACL 1000 MG/100ML IV SOLN
1000.0000 mg | Freq: Once | INTRAVENOUS | Status: AC
  Administered 2020-03-10: 1000 mg via INTRAVENOUS
  Filled 2020-03-10: qty 100

## 2020-03-10 MED ORDER — LORAZEPAM 2 MG/ML IJ SOLN
INTRAMUSCULAR | Status: AC
  Filled 2020-03-10: qty 1

## 2020-03-10 MED ORDER — METHYLPREDNISOLONE SODIUM SUCC 40 MG IJ SOLR
40.0000 mg | Freq: Two times a day (BID) | INTRAMUSCULAR | Status: DC
  Administered 2020-03-10: 40 mg via INTRAVENOUS
  Filled 2020-03-10 (×2): qty 1

## 2020-03-10 NOTE — Progress Notes (Signed)
PROGRESS NOTE    Elizabeth Bautista  YTK:[id number] DOB: 1975-10-14 DOA: 03/07/2020 PCP: Wilson Singer, MD   Brief Narrative:  Per HPI: Elizabeth Bautista a 44 y.o.femalewith medical history significant forasthma, anemia, seizures, sleep apnea, type II DM, hypertension, GERD, rheumatoid arthritis who presents to the emergency department due to 2-week onset of shortness of breath which worsened within last 4 days. Patient complained of increased shortness of breath and wheezing which started about 2 weeks ago, she states that she followed up with her pulmonologist who prescribed her with Stiolto and prednisone with improved symptoms, she completed the prednisone about 4-5 days ago and shortness of breath increased and progressively worsenedsince last 4 days. This was associated with cough with occasional production of clear mucus and pleuritic chest pain (due to coughing). Home breathing treatment with nebulizer did not alleviate the patient's symptoms. Patient has a history of sleep apnea and she uses CPAP at night, but she states that the CPAP became nonfunctional about 3 weeks ago and she was in the process of obtaining another one.She had a virtual consult with her PCP today and patient was referred to the emergency department for further evaluation and management. She denies fever, chills, headache, nausea, vomiting or diarrhea. She has never been intubated due to asthma exacerbation. Patient had similar presentation in May of this year (5/18-5/23)and she was referred to a pulmonologist on discharge. This is the third hospital admission for asthma exacerbation history by patient.  10/7:Patient has been admitted with acute asthma exacerbation and was noted to previously in outpatient setting have upper airway cough syndrome. This has been ongoing for the last 2 weeks and she has failed outpatient treatment with prednisone. She is currently on BiPAP and still actively wheezing and having  difficulty breathing, although this is minimally improved. Started on duo nebs, Solu-Medrol, and azithromycin. Chest x-ray and Covid testing negative. Pulmonology consulted for assistance in management.  10/8: Patient continues to have some ongoing shortness of breath, but this is improving along with her cough.  She is still not quite at baseline and is still requiring ongoing breathing treatments and IV steroids.  10/9: Patient unfortunately had experienced a brief seizure this morning with rapid response called.  She received some Ativan and has been started on IV Keppra.  EEG ordered.  CT head with no acute findings.  Shortness of breath has improved.  Assessment & Plan:   Principal Problem:   Asthma exacerbation Active Problems:   Sleep apnea   Depression   Rheumatoid arthritis (HCC)   Class 3 severe obesity with serious comorbidity and body mass index (BMI) of 45.0 to 49.9 in adult [facility])   Hypertension   Anemia   Upper airway cough syndrome   GERD (gastroesophageal reflux disease)   Anxiety   History of seizures   Hypokalemia   Acute asthma exacerbation   Acute asthma exacerbation with upper airway cough syndrome -improving Currently with increased work of breathing on BiPAP requiring stepdown unit monitoring Consultation to pulmonology with assistance appreciated Continue duo nebs, Solu-Medrol at lower dose, azithromycin. -Continue on gabapentin as prescribed Continue Protonix to prevent steroid-induced ulcer Continue incentive spirometry and flutter valve Continue Tussionex  Breakthrough seizure with prior history of seizures noted Likely related to recent tramadol use, discontinued 10/9 Patient will need outpatient neurology follow-up CT head with no acute findings EEG ordered Seizure precautions with 2 mg Ativan IV given earlier and will be ordered. Keppra loading and dosing initiated  Class3severe obesity and obstructive sleep  apnea in the setting of asthma  exacerbation Patient states that her CPAP became nonfunctional 3 weeks ago, symptoms started about 2 weeks ago. Continue CPAP at night BMI is 47.26 kg/meter squared Patient will be counseled on lifestyle modification, diet and exercise when more stable  Type 2 diabetes mellituswith hyperglycemia likely steroid-induced Increaseinsulin sliding scale and maintainhypoglycemia protocol Increase mealtime insulin to 10 units on 10/8 A1c during last admission was 6.6,this was 6.9 last month  Metformin will be held at this time  Essential hypertension-stable Continue home HCTZ  GERD Continue Protonix  Anemia (microcytic) Continue ferrous sulfate 325 mg twice daily per home regimen  Rheumatoid arthritis Continue Plaquenil per home regimen  Depression/anxiety Continue buspirone, Lexaproperhome regimen  Chronic back pain Continue on Tylenol 650 mg p.o. every 6 as needed for mild pain Continue ibuprofen 600 mg p.o. every 8 hours as needed for moderate pain   DVT prophylaxis:Lovenox Code Status:Full code Family Communication:Discussed with son on phone 10/9 Disposition Plan: Status is: Inpatient  Remains inpatient appropriate because:IV treatments appropriate due to intensity of illness or inability to take PO and Inpatient level of care appropriate due to severity of illness   Dispo: The patient is from: Home  Anticipated d/c is to: Home  Anticipated d/c date is: 1-2 days  Patient currently is not medically stable to d/c.  Patient will need ongoing monitoring for further seizure activity and has been started on medications.  Continue breathing treatments.  Consultants:  Pulmonology  Procedures:  See below  Antimicrobials:  Anti-infectives (From admission, onward)   Start     Dose/Rate Route Frequency Ordered Stop   03/08/20 1000  azithromycin (ZITHROMAX) tablet 250 mg       "Followed by" Linked Group Details    250 mg Oral Daily 03/07/20 2055 03/12/20 0959   03/07/20 2200  hydroxychloroquine (PLAQUENIL) tablet 200 mg        200 mg Oral 2 times daily 03/07/20 2028     03/07/20 2130  azithromycin (ZITHROMAX) tablet 500 mg       "Followed by" Linked Group Details   500 mg Oral Daily 03/07/20 2055 03/07/20 2120       Subjective: Patient seen and evaluated today with noted seizure activity this morning.  She is currently calm and conversant and is not exhibiting any other mental status changes after the seizure.  Her breathing is improved.  Objective: Vitals:   03/10/20 0758 03/10/20 0815 03/10/20 0948 03/10/20 0950  BP: (!) 149/89     Pulse: (!) 104 95    Resp:  20    Temp:      TempSrc:      SpO2: 99% 93% 97% 100%  Weight:      Height:        Intake/Output Summary (Last 24 hours) at 03/10/2020 1129 Last data filed at 03/09/2020 2043 Gross per 24 hour  Intake 745 ml  Output 800 ml  Net -55 ml   Filed Weights   03/07/20 1532 03/08/20 1623  Weight: 128.8 kg 129.5 kg    Examination:  General exam: Appears calm and comfortable, obese Respiratory system: Clear to auscultation. Respiratory effort normal. Cardiovascular system: S1 & S2 heard, RRR. Gastrointestinal system: Abdomen is nondistended, soft and nontender. Central nervous system: Alert and oriented. No focal neurological deficits. Extremities: Symmetric 5 x 5 power. Skin: No rashes, lesions or ulcers Psychiatry: Judgement and insight appear normal. Mood & affect appropriate.     Data Reviewed: I have personally reviewed following  labs and imaging studies  CBC: Recent Labs  Lab 03/07/20 1536 03/08/20 0432 03/09/20 0633 03/10/20 0619  WBC 9.7 13.7* 20.3* 20.4*  NEUTROABS 6.6  --   --   --   HGB 10.8* 10.6* 10.5* 11.3*  HCT 35.2* 34.9* 36.0 38.7  MCV 71.7* 72.3* 73.2* 73.3*  PLT 284 281 299 330   Basic Metabolic Panel: Recent Labs  Lab 03/07/20 1536 03/08/20 0432 03/09/20 0633  NA 134* 132* 133*  K 3.3*  4.5 4.9  CL 100 101 102  CO2 25 21* 23  GLUCOSE 109* 262* 270*  BUN 8 10 10   CREATININE 0.71 0.70 0.72  CALCIUM 8.7* 9.0 8.7*  MG  --  2.0 2.3   GFR: Estimated Creatinine Clearance: 120.6 mL/min (by C-G formula based on SCr of 0.72 mg/dL). Liver Function Tests: Recent Labs  Lab 03/07/20 1536 03/08/20 0432  AST 16 16  ALT 18 19  ALKPHOS 75 74  BILITOT 0.4 0.4  PROT 7.3 7.2  ALBUMIN 3.4* 3.4*   No results for input(s): LIPASE, AMYLASE in the last 168 hours. No results for input(s): AMMONIA in the last 168 hours. Coagulation Profile: Recent Labs  Lab 03/08/20 0432  INR 1.1   Cardiac Enzymes: No results for input(s): CKTOTAL, CKMB, CKMBINDEX, TROPONINI in the last 168 hours. BNP (last 3 results) No results for input(s): PROBNP in the last 8760 hours. HbA1C: No results for input(s): HGBA1C in the last 72 hours. CBG: Recent Labs  Lab 03/09/20 1107 03/09/20 1610 03/09/20 2045 03/10/20 0730 03/10/20 1115  GLUCAP 143* 241* 222* 187* 158*   Lipid Profile: No results for input(s): CHOL, HDL, LDLCALC, TRIG, CHOLHDL, LDLDIRECT in the last 72 hours. Thyroid Function Tests: No results for input(s): TSH, T4TOTAL, FREET4, T3FREE, THYROIDAB in the last 72 hours. Anemia Panel: No results for input(s): VITAMINB12, FOLATE, FERRITIN, TIBC, IRON, RETICCTPCT in the last 72 hours. Sepsis Labs: No results for input(s): PROCALCITON, LATICACIDVEN in the last 168 hours.  Recent Results (from the past 240 hour(s))  Resp Panel by RT PCR (RSV, Flu A&B, Covid) - Nasopharyngeal Swab     Status: None   Collection Time: 03/07/20  3:40 PM   Specimen: Nasopharyngeal Swab  Result Value Ref Range Status   SARS Coronavirus 2 by RT PCR NEGATIVE NEGATIVE Final    Comment: (NOTE) SARS-CoV-2 target nucleic acids are NOT DETECTED.  The SARS-CoV-2 RNA is generally detectable in upper respiratoy specimens during the acute phase of infection. The lowest concentration of SARS-CoV-2 viral copies this  assay can detect is 131 copies/mL. A negative result does not preclude SARS-Cov-2 infection and should not be used as the sole basis for treatment or other patient management decisions. A negative result may occur with  improper specimen collection/handling, submission of specimen other than nasopharyngeal swab, presence of viral mutation(s) within the areas targeted by this assay, and inadequate number of viral copies (<131 copies/mL). A negative result must be combined with clinical observations, patient history, and epidemiological information. The expected result is Negative.  Fact Sheet for Patients:  05/07/20  Fact Sheet for Healthcare Providers:  https://www.moore.com/  This test is no t yet approved or cleared by the https://www.young.biz/ FDA and  has been authorized for detection and/or diagnosis of SARS-CoV-2 by FDA under an Emergency Use Authorization (EUA). This EUA will remain  in effect (meaning this test can be used) for the duration of the COVID-19 declaration under Section 564(b)(1) of the Act, 21 U.S.C. section 360bbb-3(b)(1), unless the  authorization is terminated or revoked sooner.     Influenza A by PCR NEGATIVE NEGATIVE Final   Influenza B by PCR NEGATIVE NEGATIVE Final    Comment: (NOTE) The Xpert Xpress SARS-CoV-2/FLU/RSV assay is intended as an aid in  the diagnosis of influenza from Nasopharyngeal swab specimens and  should not be used as a sole basis for treatment. Nasal washings and  aspirates are unacceptable for Xpert Xpress SARS-CoV-2/FLU/RSV  testing.  Fact Sheet for Patients: https://www.moore.com/  Fact Sheet for Healthcare Providers: https://www.young.biz/  This test is not yet approved or cleared by the [place] FDA and  has been authorized for detection and/or diagnosis of SARS-CoV-2 by  FDA under an Emergency Use Authorization (EUA). This EUA will  remain  in effect (meaning this test can be used) for the duration of the  Covid-19 declaration under Section 564(b)(1) of the Act, 21  U.S.C. section 360bbb-3(b)(1), unless the authorization is  terminated or revoked.    Respiratory Syncytial Virus by PCR NEGATIVE NEGATIVE Final    Comment: (NOTE) Fact Sheet for Patients: https://www.moore.com/  Fact Sheet for Healthcare Providers: https://www.young.biz/  This test is not yet approved or cleared by the [place] FDA and  has been authorized for detection and/or diagnosis of SARS-CoV-2 by  FDA under an Emergency Use Authorization (EUA). This EUA will remain  in effect (meaning this test can be used) for the duration of the  COVID-19 declaration under Section 564(b)(1) of the Act, 21 U.S.C.  section 360bbb-3(b)(1), unless the authorization is terminated or  revoked. Performed at [facility], [address], [place], [place] [address]          Radiology Studies: CT HEAD WO CONTRAST  Result Date: 03/10/2020 CLINICAL DATA:  Nontraumatic seizure EXAM: CT HEAD WITHOUT CONTRAST TECHNIQUE: Contiguous axial images were obtained from the base of the skull through the vertex without intravenous contrast. COMPARISON:  None. FINDINGS: Brain: The brain shows a normal appearance without evidence of malformation, atrophy, old or acute small or large vessel infarction, mass lesion, hemorrhage, hydrocephalus or extra-axial collection. Vascular: No hyperdense vessel. No evidence of atherosclerotic calcification. Skull: Normal.  No traumatic finding.  No focal bone lesion. Sinuses/Orbits: Sinuses are clear. Orbits appear normal. Mastoids are clear. Other: None significant IMPRESSION: Normal head CT. Electronically Signed   By: Paulina Fusi M.D.   On: 03/10/2020 10:39        Scheduled Meds: . arformoterol  15 mcg Nebulization BID  . azithromycin  250 mg Oral Daily  . budesonide (PULMICORT) nebulizer  solution  0.25 mg Nebulization BID  . busPIRone  7.5 mg Oral BID  . chlorpheniramine-HYDROcodone  5 mL Oral Q6H  . enoxaparin (LOVENOX) injection  40 mg Subcutaneous Q24H  . escitalopram  10 mg Oral Daily  . ferrous sulfate  325 mg Oral BID  . gabapentin  300 mg Oral TID  . hydrochlorothiazide  25 mg Oral Daily  . hydroxychloroquine  200 mg Oral BID  . insulin aspart  0-20 Units Subcutaneous TID WC  . insulin aspart  0-5 Units Subcutaneous QHS  . insulin aspart  10 Units Subcutaneous TID WC  . methylPREDNISolone (SOLU-MEDROL) injection  40 mg Intravenous Q12H  . montelukast  10 mg Oral QHS  . pantoprazole  40 mg Oral BID AC   Continuous Infusions: . levETIRAcetam 500 mg (03/10/20 0901)     LOS: 2 days    Time spent: 45 minutes    Genisis Sonnier Hoover Brunette, DO [facility]  If  7PM-7AM, please contact night-coverage [email or web address] 03/10/2020, 11:29 AM

## 2020-03-10 NOTE — Progress Notes (Signed)
CCMD called this nurse at [phone] and stated patient's rhythm looked to be in vtach but leads were off. 2 nurses went to assess patient and found her actively seizing. Rapid response called, charge nurse, [facility], RT and Dr. Sherryll Burger at bedside. Suction in place. See flowsheet for VS. Patient able to maintain airway. IV Ativan given. Seizure stopped approximately 0756. No injuries noted. Purewick in place. EKG obtained. Patient able to be aroused and stated "I feel funny." Patient informed of seizure activity. Patient currently sleeping, c/o of nausea but no vomiting. Current HR 95, o2 sat 93% on room air. Will continue to monitor.

## 2020-03-10 NOTE — Progress Notes (Signed)
Patient c/o head feeling "funny," and describes pain as pressure pushing down. States that when she feels this, a seizure is inevitable. PRN IV Ativan given. Children at bedside. Dr. Sherryll Burger made aware, will continue to monitor.

## 2020-03-11 LAB — CBC
HCT: 36 % (ref 36.0–46.0)
Hemoglobin: 10.5 g/dL — ABNORMAL LOW (ref 12.0–15.0)
MCH: 21.3 pg — ABNORMAL LOW (ref 26.0–34.0)
MCHC: 29.2 g/dL — ABNORMAL LOW (ref 30.0–36.0)
MCV: 73.2 fL — ABNORMAL LOW (ref 80.0–100.0)
Platelets: 296 10*3/uL (ref 150–400)
RBC: 4.92 MIL/uL (ref 3.87–5.11)
RDW: 17.1 % — ABNORMAL HIGH (ref 11.5–15.5)
WBC: 16.2 10*3/uL — ABNORMAL HIGH (ref 4.0–10.5)
nRBC: 0 % (ref 0.0–0.2)

## 2020-03-11 LAB — GLUCOSE, CAPILLARY
Glucose-Capillary: 257 mg/dL — ABNORMAL HIGH (ref 70–99)
Glucose-Capillary: 126 mg/dL — ABNORMAL HIGH (ref 70–99)
Glucose-Capillary: 113 mg/dL — ABNORMAL HIGH (ref 70–99)
Glucose-Capillary: 166 mg/dL — ABNORMAL HIGH (ref 70–99)

## 2020-03-11 LAB — BASIC METABOLIC PANEL
Anion gap: 10 (ref 5–15)
BUN: 16 mg/dL (ref 6–20)
CO2: 27 mmol/L (ref 22–32)
Calcium: 8.5 mg/dL — ABNORMAL LOW (ref 8.9–10.3)
Chloride: 97 mmol/L — ABNORMAL LOW (ref 98–111)
Creatinine, Ser: 0.74 mg/dL (ref 0.44–1.00)
GFR, Estimated: >60 mL/min (ref >60)
Glucose, Bld: 258 mg/dL — ABNORMAL HIGH (ref 70–99)
Potassium: 3.9 mmol/L (ref 3.5–5.1)
Sodium: 134 mmol/L — ABNORMAL LOW (ref 135–145)

## 2020-03-11 LAB — MAGNESIUM: Magnesium: 2 mg/dL (ref 1.7–2.4)

## 2020-03-11 MED ORDER — INSULIN DETEMIR 100 UNIT/ML ~~LOC~~ SOLN
10.0000 [IU] | Freq: Every day | SUBCUTANEOUS | Status: DC
  Administered 2020-03-11 – 2020-03-14 (×4): 10 [IU] via SUBCUTANEOUS
  Filled 2020-03-11 (×5): qty 0.1

## 2020-03-11 MED ORDER — PREDNISONE 20 MG PO TABS
40.0000 mg | ORAL_TABLET | Freq: Every day | ORAL | Status: DC
  Administered 2020-03-12 – 2020-03-14 (×3): 40 mg via ORAL
  Filled 2020-03-11 (×3): qty 2

## 2020-03-11 MED ORDER — ONDANSETRON HCL 4 MG/2ML IJ SOLN
4.0000 mg | Freq: Four times a day (QID) | INTRAMUSCULAR | Status: DC | PRN
  Administered 2020-03-11 – 2020-03-12 (×2): 4 mg via INTRAVENOUS
  Filled 2020-03-11 (×2): qty 2

## 2020-03-11 NOTE — Progress Notes (Signed)
This nurse entered room after nurse tech called reporting that the patietn was nauseated. The patient was sitting on the toilet having a BM and stated she felt nauseated and weak. Also reported her head "feeling funny" as it does prior to her past seizures. After having BM, patient ambulated back to be with the assistance of the nurse and nurse tech and a walker. Dr. Sherryll Burger notified and PRN Zofran was ordered and given. PRN Ativan was also given. Approx. 20 minutes after administration, this nurse asked patient how she was feeling and she stated she still felt weak and her head felt "funny." Call bell is within reach. Will continue to monitor.

## 2020-03-11 NOTE — Progress Notes (Signed)
PROGRESS NOTE    Elizabeth Bautista  JJO:[id number] DOB: [birth year] DOA: 03/07/2020 PCP: Wilson Singer, MD   Brief Narrative:  Per HPI: [name] a 44 y.o.femalewith medical history significant forasthma, anemia, seizures, sleep apnea, type II DM, hypertension, GERD, rheumatoid arthritis who presents to the emergency department due to 2-week onset of shortness of breath which worsened within last 4 days. Patient complained of increased shortness of breath and wheezing which started about 2 weeks ago, she states that she followed up with her pulmonologist who prescribed her with Stiolto and prednisone with improved symptoms, she completed the prednisone about 4-5 days ago and shortness of breath increased and progressively worsenedsince last 4 days. This was associated with cough with occasional production of clear mucus and pleuritic chest pain (due to coughing). Home breathing treatment with nebulizer did not alleviate the patient's symptoms. Patient has a history of sleep apnea and she uses CPAP at night, but she states that the CPAP became nonfunctional about 3 weeks ago and she was in the process of obtaining another one.She had a virtual consult with her PCP today and patient was referred to the emergency department for further evaluation and management. She denies fever, chills, headache, nausea, vomiting or diarrhea. She has never been intubated due to asthma exacerbation. Patient had similar presentation in May of this year (5/18-5/23)and she was referred to a pulmonologist on discharge. This is the third hospital admission for asthma exacerbation history by patient.  10/7:Patient has been admitted with acute asthma exacerbation and was noted to previously in outpatient setting have upper airway cough syndrome. This has been ongoing for the last 2 weeks and she has failed outpatient treatment with prednisone. She is currently on BiPAP and still actively wheezing and having  difficulty breathing, although this is minimally improved. Started on duo nebs, Solu-Medrol, and azithromycin. Chest x-ray and Covid testing negative. Pulmonology consulted for assistance in management.  10/8: Patient continues to have some ongoing shortness of breath, but this is improving along with her cough. She is still not quite at baseline and is still requiring ongoing breathing treatments and IV steroids.  10/9: Patient unfortunately had experienced a brief seizure this morning with rapid response called.  She received some Ativan and has been started on IV Keppra.  EEG ordered.  CT head with no acute findings.  Shortness of breath has improved.  10/10: No further seizures noted overnight.  EEG still pending.  Shortness of breath improved.  Levemir started for hyperglycemia.  Taper steroids to oral prednisone starting 10/11.  Assessment & Plan:   Principal Problem:   Asthma exacerbation Active Problems:   Sleep apnea   Depression   Rheumatoid arthritis (HCC)   Class 3 severe obesity with serious comorbidity and body mass index (BMI) of 45.0 to 49.9 in adult [facility])   Hypertension   Anemia   Upper airway cough syndrome   GERD (gastroesophageal reflux disease)   Anxiety   History of seizures   Hypokalemia   Acute asthma exacerbation   Acute asthma exacerbationwith upper airway cough syndrome -improving Currently with increased work of breathing on BiPAP requiring stepdown unit monitoring Consultation to pulmonologywith assistance appreciated DuoNebs as needed and outpatient taper to prednisone.  Azithromycin DC'd by tomorrow. -Continue on gabapentin as prescribed Continue Protonix to prevent steroid-induced ulcer Continue incentive spirometry and flutter valve Continue Tussionex  Breakthrough seizure with prior history of seizures noted Likely related to recent tramadol use, discontinued 10/9, oxycodone given now instead for pain control  as needed Patient will need  outpatient neurology follow-up CT head with no acute findings EEG ordered and pending.   Seizure precautions with 2 mg Ativan IV as needed. Keppra loading and dosing initiated 10/9  Class3severe obesity and obstructive sleep apnea in the setting of asthma exacerbation Patient states that her CPAP became nonfunctional 3 weeks ago, symptoms started about 2 weeks ago. Continue CPAP at night BMI is 47.26 kg/meter squared Patient will be counseled on lifestyle modification, diet and exercise when more stable  Type 2 diabetes mellituswith hyperglycemia likely steroid-induced Increaseinsulin sliding scale and maintainhypoglycemia protocol Increase mealtime insulin to 10 units on 10/8 A1c during last admission was 6.6,this was 6.9 last month  Metformin will be held at this time -10 units Levemir daily added 10/10 for better blood glucose control, start tapering steroids to oral prednisone  Essential hypertension-stable Continue home HCTZ  GERD Continue Protonix  Anemia (microcytic) Continue ferrous sulfate 325 mg twice daily per home regimen  Rheumatoid arthritis Continue Plaquenil per home regimen  Depression/anxiety Continue buspirone, Lexaproperhome regimen  Chronic back pain Continue on Tylenol 650 mg p.o. every 6 as needed for mild pain Continue ibuprofen 600 mg p.o. every 8 hours as needed for moderate pain   DVT prophylaxis:Lovenox Code Status:Full code Family Communication:Discussed with son on phone 10/9 Disposition Plan: Status is: Inpatient  Remains inpatient appropriate because:IV treatments appropriate due to intensity of illness or inability to take PO and Inpatient level of care appropriate due to severity of illness   Dispo: The patient is from:Home Anticipated d/c is [place]:[facility] Anticipated d/c date is: 1-2 days Patient currently is not medically stable to d/c.Patient will need ongoing  monitoring for further seizure activity and has been started on medications.  Continue breathing treatments.  Consultants:  Pulmonology  Neurology  Procedures:  See below  Antimicrobials:  Anti-infectives (From admission, onward)   Start     Dose/Rate Route Frequency Ordered Stop   03/08/20 1000  azithromycin (ZITHROMAX) tablet 250 mg       "Followed by" Linked Group Details   250 mg Oral Daily 03/07/20 2055 03/12/20 0959   03/07/20 2200  hydroxychloroquine (PLAQUENIL) tablet 200 mg        200 mg Oral 2 times daily 03/07/20 2028     03/07/20 2130  azithromycin (ZITHROMAX) tablet 500 mg       "Followed by" Linked Group Details   500 mg Oral Daily 03/07/20 2055 03/07/20 2120       Subjective: Patient seen and evaluated today with no new acute complaints or concerns. No acute concerns or events noted overnight.  No further seizure activity noted overnight.  Objective: Vitals:   03/10/20 1340 03/10/20 2034 03/10/20 2042 03/11/20 0524  BP: (!) 143/82 129/85  135/77  Pulse: 60 66  (!) 56  Resp: 19 19 20 19   Temp: (!) 97.4 F (36.3 C) 98.4 F (36.9 C)  98.2 F (36.8 C)  TempSrc: Oral Oral  Oral  SpO2: 95% 95%  100%  Weight:      Height:        Intake/Output Summary (Last 24 hours) at 03/11/2020 1013 Last data filed at 03/11/2020 0416 Gross per 24 hour  Intake 340 ml  Output 1 ml  Net 339 ml   Filed Weights   03/07/20 1532 03/08/20 1623  Weight: 128.8 kg 129.5 kg    Examination:  General exam: Appears calm and comfortable, somnolent Respiratory system: Clear to auscultation. Respiratory effort normal.  Currently on BiPAP.  Cardiovascular system: S1 & S2 heard, RRR.  Gastrointestinal system: Abdomen is nondistended, soft and nontender.  Central nervous system: Somnolent Extremities: No edema Skin: No rashes, lesions or ulcers Psychiatry11-01-2021t affect    Data Reviewed: I have personally reviewed following labs and imaging studies  CBC: Recent Labs    Lab 03/07/20 1536 03/08/20 0432 03/09/20 0633 03/10/20 0619 03/11/20 0635  WBC 9.7 13.7* 20.3* 20.4* 16.2*  NEUTROABS 6.6  --   --   --   --   HGB 10.8* 10.6* 10.5* 11.3* 10.5*  HCT 35.2* 34.9* 36.0 38.7 36.0  MCV 71.7* 72.3* 73.2* 73.3* 73.2*  PLT 284 281 299 330 296   Basic Metabolic Panel: Recent Labs  Lab Apr 02, 2020 1536 03/08/20 0432 03/09/20 0633 03/11/20 0635  NA 134* 132* 133* 134*  K 3.3* 4.5 4.9 3.9  CL 100 101 102 97*  CO2 25 21* 23 27  GLUCOSE 109* 262* 270* 258*  BUN CREATININE 0.71 0.70 0.72 0.74  CALCIUM 8.7* 9.0 8.7* 8.5*  MG  --  2.0 2.3 2.0   GFR: Estimated Creatinine Clearance: 120.6 mL/min (by C-G formula based on SCr of 0.74 mg/dL). Liver Function Tests: Recent Labs  Lab 04-02-2020 1536 03/08/20 0432  AST 16 16  ALT 18 19  ALKPHOS 75 74  BILITOT 0.4 0.4  PROT 7.3 7.2  ALBUMIN 3.4* 3.4*   No results for input(s): LIPASE, AMYLASE in the last 168 hours. No results for input(s): AMMONIA in the last 168 hours. Coagulation Profile: Recent Labs  Lab 03/08/20 0432  INR 1.1   Cardiac Enzymes: No results for input(s): CKTOTAL, CKMB, CKMBINDEX, TROPONINI in the last 168 hours. BNP (last 3 results) No results for input(s): PROBNP in the last 8760 hours. HbA1C: No results for input(s): HGBA1C in the last 72 hours. CBG: Recent Labs  Lab 03/10/20 0730 03/10/20 1115 03/10/20 1608 03/10/20 2032 03/11/20 0810  GLUCAP 187* 158* 227* 155* 257*   Lipid Profile: No results for input(s): CHOL, HDL, LDLCALC, TRIG, CHOLHDL, LDLDIRECT in the last 72 hours. Thyroid Function Tests: No results for input(s): TSH, T4TOTAL, FREET4, T3FREE, THYROIDAB in the last 72 hours. Anemia Panel: No results for input(s): VITAMINB12, FOLATE, FERRITIN, TIBC, IRON, RETICCTPCT in the last 72 hours. Sepsis Labs: No results for input(s): PROCALCITON, LATICACIDVEN in the last 168 hours.  Recent Results (from the past 240 hour(s))  Resp Panel by RT PCR (RSV,  Flu A&B, Covid) - Nasopharyngeal Swab     Status: None   Collection Time: 2020-04-02  3:40 PM   Specimen: Nasopharyngeal Swab  Result Value Ref Range Status   SARS Coronavirus 2 by RT PCR NEGATIVE NEGATIVE Final    Comment: (NOTE) SARS-CoV-2 target nucleic acids are NOT DETECTED.  The SARS-CoV-2 RNA is generally detectable in upper respiratoy specimens during the acute phase of infection. The lowest concentration of SARS-CoV-2 viral copies this assay can detect is 131 copies/mL. A negative result does not preclude SARS-Cov-2 infection and should not be used as the sole basis for treatment or other patient management decisions. A negative result may occur with  improper specimen collection/handling, submission of specimen other than nasopharyngeal swab, presence of viral mutation(s) within the areas targeted by this assay, and inadequate number of viral copies (<131 copies/mL). A negative result must be combined with clinical observations, patient history, and epidemiological information. The expected result is Negative.  Fact Sheet for Patients:  https://www.moore.com/  Fact Sheet for Healthcare Providers:  https://www.young.biz/  This test is no t yet approved or cleared by the [place] and  has been authorized for detection and/or diagnosis of SARS-CoV-2 by FDA under an Emergency Use Authorization (EUA). This EUA will remain  in effect (meaning this test can be used) for the duration of the COVID-19 declaration under Section 564(b)(1) of the Act, 21 U.S.C. section 360bbb-3(b)(1), unless the authorization is terminated or revoked sooner.     Influenza A by PCR NEGATIVE NEGATIVE Final   Influenza B by PCR NEGATIVE NEGATIVE Final    Comment: (NOTE) The Xpert Xpress SARS-CoV-2/FLU/RSV assay is intended as an aid in  the diagnosis of influenza from Nasopharyngeal swab specimens and  should not be used as a sole basis for treatment.  Nasal washings and  aspirates are unacceptable for Xpert Xpress SARS-CoV-2/FLU/RSV  testing.  Fact Sheet for Patients: https://www.moore.com/  Fact Sheet for Healthcare Providers: https://www.young.biz/  This test is not yet approved or cleared by the [place] FDA and  has been authorized for detection and/or diagnosis of SARS-CoV-2 by  FDA under an Emergency Use Authorization (EUA). This EUA will remain  in effect (meaning this test can be used) for the duration of the  Covid-19 declaration under Section 564(b)(1) of the Act, 21  U.S.C. section 360bbb-3(b)(1), unless the authorization is  terminated or revoked.    Respiratory Syncytial Virus by PCR NEGATIVE NEGATIVE Final    Comment: (NOTE) Fact Sheet for Patients: https://www.moore.com/  Fact Sheet for Healthcare Providers: https://www.young.biz/  This test is not yet approved or cleared by the [place] FDA and  has been authorized for detection and/or diagnosis of SARS-CoV-2 by  FDA under an Emergency Use Authorization (EUA). This EUA will remain  in effect (meaning this test can be used) for the duration of the  COVID-19 declaration under Section 564(b)(1) of the Act, 21 U.S.C.  section 360bbb-3(b)(1), unless the authorization is terminated or  revoked. Performed at [facility], [address] [place], [place] [address]          Radiology Studies: CT HEAD WO CONTRAST  Result Date: 03/10/2020 CLINICAL DATA:  Nontraumatic seizure EXAM: CT HEAD WITHOUT CONTRAST TECHNIQUE: Contiguous axial images were obtained from the base of the skull through the vertex without intravenous contrast. COMPARISON:  None. FINDINGS: Brain: The brain shows a normal appearance without evidence of malformation, atrophy, old or acute small or large vessel infarction, mass lesion, hemorrhage, hydrocephalus or extra-axial collection. Vascular: No hyperdense  vessel. No evidence of atherosclerotic calcification. Skull: Normal.  No traumatic finding.  No focal bone lesion. Sinuses/Orbits: Sinuses are clear. Orbits appear normal. Mastoids are clear. Other: None significant IMPRESSION: Normal head CT. Electronically Signed   By: Paulina Fusi M.D.   On: 03/10/2020 10:39        Scheduled Meds: . arformoterol  15 mcg Nebulization BID  . azithromycin  250 mg Oral Daily  . budesonide (PULMICORT) nebulizer solution  0.25 mg Nebulization BID  . busPIRone  7.5 mg Oral BID  . chlorpheniramine-HYDROcodone  5 mL Oral Q6H  . enoxaparin (LOVENOX) injection  40 mg Subcutaneous Q24H  . escitalopram  10 mg Oral Daily  . ferrous sulfate  325 mg Oral BID  . gabapentin  300 mg Oral TID  . hydrochlorothiazide  25 mg Oral Daily  . hydroxychloroquine  200 mg Oral BID  . insulin aspart  0-20 Units Subcutaneous TID WC  . insulin aspart  0-5 Units Subcutaneous QHS  . insulin aspart  10 Units  Subcutaneous TID WC  . insulin detemir  10 Units Subcutaneous Daily  . montelukast  10 mg Oral QHS  . pantoprazole  40 mg Oral BID AC  . [START ON 03/12/2020] predniSONE  40 mg Oral Q breakfast   Continuous Infusions: . levETIRAcetam 500 mg (03/10/20 2041)     LOS: 3 days    Time spent: 30 minutes    Tin Engram Hoover Brunette, DO [facility]  If 7PM-7AM, please contact night-coverage [email or web address] 03/11/2020, 10:13 AM

## 2020-03-12 ENCOUNTER — Inpatient Hospital Stay (HOSPITAL_COMMUNITY)
Admit: 2020-03-12 | Discharge: 2020-03-12 | Disposition: A | Discharge status: Home / Self Care | Payer: Medicaid Other | Attending: Internal Medicine | Admitting: Internal Medicine

## 2020-03-12 DIAGNOSIS — R569 Unspecified convulsions: Secondary | ICD-10-CM

## 2020-03-12 DIAGNOSIS — Z87898 Personal history of other specified conditions: Secondary | ICD-10-CM

## 2020-03-12 LAB — CBC
HCT: 33.8 % — ABNORMAL LOW (ref 36.0–46.0)
Hemoglobin: 9.9 g/dL — ABNORMAL LOW (ref 12.0–15.0)
MCH: 21.4 pg — ABNORMAL LOW (ref 26.0–34.0)
MCHC: 29.3 g/dL — ABNORMAL LOW (ref 30.0–36.0)
MCV: 73 fL — ABNORMAL LOW (ref 80.0–100.0)
Platelets: 270 10*3/uL (ref 150–400)
RBC: 4.63 MIL/uL (ref 3.87–5.11)
RDW: 17 % — ABNORMAL HIGH (ref 11.5–15.5)
WBC: 14 10*3/uL — ABNORMAL HIGH (ref 4.0–10.5)
nRBC: 0 % (ref 0.0–0.2)

## 2020-03-12 LAB — GLUCOSE, CAPILLARY
Glucose-Capillary: 121 mg/dL — ABNORMAL HIGH (ref 70–99)
Glucose-Capillary: 164 mg/dL — ABNORMAL HIGH (ref 70–99)
Glucose-Capillary: 212 mg/dL — ABNORMAL HIGH (ref 70–99)
Glucose-Capillary: 214 mg/dL — ABNORMAL HIGH (ref 70–99)

## 2020-03-12 LAB — BASIC METABOLIC PANEL
Anion gap: 8 (ref 5–15)
BUN: 17 mg/dL (ref 6–20)
CO2: 31 mmol/L (ref 22–32)
Calcium: 8.1 mg/dL — ABNORMAL LOW (ref 8.9–10.3)
Chloride: 99 mmol/L (ref 98–111)
Creatinine, Ser: 0.76 mg/dL (ref 0.44–1.00)
GFR, Estimated: >60 mL/min (ref >60)
Glucose, Bld: 134 mg/dL — ABNORMAL HIGH (ref 70–99)
Potassium: 3.3 mmol/L — ABNORMAL LOW (ref 3.5–5.1)
Sodium: 138 mmol/L (ref 135–145)

## 2020-03-12 MED ORDER — HYDROCODONE-ACETAMINOPHEN 5-325 MG PO TABS: 1.0000 | ORAL_TABLET | Freq: Four times a day (QID) | ORAL | Status: DC | PRN

## 2020-03-12 MED ORDER — POTASSIUM CHLORIDE CRYS ER 20 MEQ PO TBCR
40.0000 meq | EXTENDED_RELEASE_TABLET | Freq: Once | ORAL | Status: AC
  Administered 2020-03-12: 40 meq via ORAL
  Filled 2020-03-12: qty 2

## 2020-03-12 NOTE — Progress Notes (Signed)
EEG complete - results pending 

## 2020-03-12 NOTE — TOC Initial Note (Signed)
Transition of Care [facility]) - Initial/Assessment Note    Patient Details  Name: Elizabeth Bautista MRN: [id number] Date of Birth: 05/27/1976  [facility]) CM/SW Contact:    Villa Herb, LCSWA Phone Number: 03/12/2020, 11:14 AM  Clinical Narrative:                 Pt admitted for asthma exacerbation. Pt high risk for readmission assessment complete. Pt lives with her 3 children. Pt states that she does not drive, he son drives her if they have a rental car. Pt states that she can complete her ADL's but sometimes she is unable to. Pt uses a cane at home. Pt states that her son will pick up her medications when she needs them. Pt states that she had HH services when she lived in [place]. Pt states that she has DME needs. Pt needs a rollator, shower chair, and a BSC. TOC will make referral to [facility] with [employer] for DME equipment needs. Per PT eval SNF is recommended for pt at d/c, per PT pt refusing SNF. TOC spoke with pt to speak about PT recommendation of SNF. Pt states that she does not want to go to SNF she only wants to go home. CSW informed pt that [facility] will make referral for outpatient therapy. TOC to follow.         Patient Goals and CMS Choice        Expected Discharge Plan and Services                                                Prior Living Arrangements/Services                       Activities of Daily Living Home Assistive Devices/Equipment: Cane (specify quad or straight), Walker (specify type) (straight cane) ADL Screening (condition at time of admission) Patient's cognitive ability adequate to safely complete daily activities?: Yes Is the patient deaf or have difficulty hearing?: No Does the patient have difficulty seeing, even when wearing glasses/contacts?: No Does the patient have difficulty concentrating, remembering, or making decisions?: No Patient able to express need for assistance with ADLs?: Yes Does the patient have difficulty  dressing or bathing?: Yes Independently performs ADLs?: No Communication: Independent Dressing (OT): Needs assistance Is this a change from baseline?: Pre-admission baseline Grooming: Needs assistance Is this a change from baseline?: Pre-admission baseline Feeding: Independent Bathing: Needs assistance Is this a change from baseline?: Pre-admission baseline Toileting: Needs assistance Is this a change from baseline?: Pre-admission baseline In/Out Bed: Needs assistance Is this a change from baseline?: Pre-admission baseline Walks in Home: Independent Does the patient have difficulty walking or climbing stairs?: Yes Weakness of Legs: Both (pt states left leg has more weakness ) Weakness of Arms/Hands: None  Permission Sought/Granted                  Emotional Assessment              Admission diagnosis:  Severe persistent asthma with exacerbation [J45.51] Asthma exacerbation [J45.901] Acute asthma exacerbation [J45.901] Patient Active Problem List   Diagnosis Date Noted  . Acute asthma exacerbation 03/08/2020  . GERD (gastroesophageal reflux disease) 03/07/2020  . Anxiety 03/07/2020  . History of seizures 03/07/2020  . Hypokalemia 03/07/2020  . Upper airway cough syndrome 02/10/2020  . Swelling  of lower extremity 02/01/2020  . Hypertension 02/01/2020  . Vitamin D deficiency 02/01/2020  . Type 2 diabetes mellitus with hyperglycemia, without long-term current use of insulin (HCC) 02/01/2020  . Anemia 02/01/2020  . Class 3 severe obesity with serious comorbidity and body mass index (BMI) of 45.0 to 49.9 in adult [facility]) 10/22/2019  . Asthma exacerbation 10/18/2019  . Sleep apnea 10/18/2019  . Depression 10/18/2019  . Rheumatoid arthritis (HCC) 10/18/2019   PCP:  Wilson Singer, MD Pharmacy:   [facility] [phone] - [place], [place] - [address] AT [facility] [address] [place] [address] Phone: [phone] Fax: [phone]     Social  Determinants of Health (SDOH) Interventions    Readmission Risk Interventions Readmission Risk Prevention Plan 03/12/2020  Transportation Screening Complete  HRI or Home Care Consult Complete  Social Work Consult for Recovery Care Planning/Counseling Complete  Palliative Care Screening Not Applicable  Medication Review [employer]) Complete

## 2020-03-12 NOTE — Consult Note (Signed)
[place] A. Merlene Laughter, MD     [email or web address]          Elizabeth Bautista is an 44 y.o. female.   ASSESSMENT/PLAN: 1.  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 She has been started on Keppra which seems reasonable. Follow-up with her outpatient neurologist but continue with Keppra for now at the current dose. 2.  Action tremor left side of unclear etiology possibly related to anxiety:  Observe for now 3.  Dyspnea thought to be due to exacerbation of asthma      This is a 44 year old black female who has the baseline history of seizures in the past. She previously has being treated in the [place] area. She recently relocated to this area is being seen in [place]. 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 The patient presented to the hospital with acute onset of dyspnea among other symptoms including chest discomfort, alteration of consciousness and apparently convulsions. She tells me that the spells are similar to the 1 she had in 2008 when she started to have seizures spells. The notes indicate that she may have 2 types of events. Apparently she can tell which events are epileptic and which are not. She tells me that the event she had on presented to the hospital seems to be consistent with the event she had in 2018 which are thought to be more likely epileptic in nature. The patient tells me she has been off medications for about 6 months or more since she left [place] because she went over a year without having any events. The patient does not report clear focal weakness although she seemed to have mild weakness on the left side examination associated with some shaking. She also reports having some headaches but tells me she does not have frequent headache and that she is not someone that has chronic episodic headaches. The review systems otherwise  negative.    GENERAL:  This is an overweight female who appears to be quite anxious but in no acute distress.  HEENT:  Neck is supple no trauma noted.  ABDOMEN: soft  EXTREMITIES: No edema   BACK: Normal  SKIN: Normal by inspection.    MENTAL STATUS: Alert and oriented. Speech, language and cognition are generally intact. Judgment and insight normal.   CRANIAL NERVES: Pupils are equal, round and reactive to light and accomodation; extra ocular movements are full, there is no significant nystagmus; visual fields are full; upper and lower facial muscles are normal in strength and symmetric, there is no flattening of the nasolabial folds; tongue is midline; uvula is midline; shoulder elevation is normal.  MOTOR:   There is mild weakness of the left upper extremity and left leg associated with low-amplitude high-frequency action tremors.  COORDINATION:  Frequent low-amplitude high-frequency action tremor left side. No parkinsonian symptoms and no rest tremor.  REFLEXES: Deep tendon reflexes are symmetrical and normal.   SENSATION: Normal to light touch, temperature, and pain.       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    HPI:  Elizabeth Bautista is a 44 y.o. female here as requested by Doree Albee, MD for seizure. PMHx sleep apnea, hypertension, type 2 diabetes,  rheumatoid arthritis, depression, class III severe obesity with serious comorbidity and body mass index of 45-49, asthma, seizures.  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   She sates these episodes are becoming more aggressive, they cause her pain, she is not driving. They are random. These are different than the ones in 2019 but similar to the ones in 2007 from [place]. The last one she had she was foaming, she "blacks out", she wakes up and her whole body is stiff and she she has bad headaches. 2 times she threw up. The current episode started in May of this year which are different these "new ones" that started this year but she has been having episodes for years especially since 2019 (from 2012-2019). These new episodes are different.  She has had 2 in May, June had 1, none in July, august she had 4 becoming more frequent and more severe. The one last week lasted 6 minutes. She does not remember the episodes, the last one her blood pressure wa elevated, she just "blacked out", she was watching TV, was on the bed, (we can't call him because he is sleeping), she started "seizing"  And son explained it to patient that he noticed she leaned over, she started shaking but it look different from the "other" seizures and it seemed like she was choking, she started throwing up and he held up her head, she opened her eyes and she kept saying "what happened". She was diagnosed at [facility] in [place] in 2007 around 2008 until 2014-2015 when she moved to [place]. She has family history of cousins on mom's side. She doesn't remember what kind of seizures she was diagnosed with in May. She was allergic one medication they used and don;t remember which one they used. She will lose her urine and defecate on herself. These are similar to the seizures she had in May. And she says she knows the difference between pseudoseizures and seizures. No other focal neurologic deficits,  associated symptoms, inciting events or modifiable factors.     Blood pressure 126/76, pulse 65, temperature 98 F (36.7 C), temperature source Oral, resp. rate 18, height 5' 5"  (1.651 m), weight 129.5 kg, last menstrual period 02/15/2020, SpO2 100 %.  Past Medical History:  Diagnosis Date  . Anemia   . Asthma   . Diabetes ([place])    on Metformin  . Edema   . 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 HTN (hypertension)   . Rheumatoid arthritis ([place])    on Plaquenil   . Seizures ([place])    last seizure 2019  . Sleep apnea   . Sleep apnea     Past Surgical History:  Procedure Laterality Date  . APPENDECTOMY    . CESAREAN SECTION     x3    Family History  Problem Relation Age of Onset  . High blood pressure Mother   . Diabetes Father   . Diabetes Sister   .  Heart Problems Sister   . Diabetes Brother   . Diabetes Paternal Grandmother   . Diabetes Other        father's side "everybody died from Diabetes"  . High blood pressure Other        mother's side, multiple siblings with this   . Heart attack Other        family member on mother's side   . Diabetes Paternal Aunt   . Seizures Cousin        not sibings to the other cousins with seizures  . Seizures Cousin        not sibings to the other cousins with seizures  . Seizures Cousin        not sibings to the other cousins with seizures  . Cervical cancer Maternal Aunt   . Breast cancer Cousin   . Dementia Maternal Aunt   . Asthma Maternal Aunt   . Heart Problems Maternal Grandmother   . Asthma Child   . Asthma Child     Social History:  reports that she quit smoking about 5 years ago. Her smoking use included cigarettes. She quit after 10.00 years of use. She has never used smokeless tobacco. She reports previous alcohol use. She reports previous drug use.  Allergies:  Allergies  Allergen Reactions  . Phenytoin Anaphylaxis     Other reaction(s): swelling and heart rate decreased  . Phenylbutazones     Medications: Prior to Admission medications   Medication Sig Start Date End Date Taking? Authorizing Provider  acetaminophen (TYLENOL) 500 MG chewable tablet Chew 1 tablet (500 mg total) by mouth every 8 (eight) hours as needed for pain. 02/20/20  Yes Ailene Ards, NP  albuterol [facility] HFA) 108 [phone] Base) MCG/ACT inhaler 2 puffs every 4 hours as needed only  if your can't catch your breath 02/09/20  Yes Tanda Rockers, MD  blood glucose meter kit and supplies Dispense based on patient and insurance preference. Use up to four times daily as directed. (FOR ICD-10 E10.9, E11.9). 02/01/20  Yes Ailene Ards, NP  Blood Pressure Monitoring [facility]) MISC 1 each by Does not apply route daily. 01/18/20  Yes Ailene Ards, NP  busPIRone (BUSPAR) 7.5 MG tablet Take 7.5 mg by mouth 2 (two) times daily. 09/05/19  Yes [provider]  Cholecalciferol 1.25 MG (50000 UT) TABS Take 1 tablet by mouth daily. 02/01/20  Yes Ailene Ards, NP  cyclobenzaprine (FLEXERIL) 10 MG tablet Take 10 mg by mouth 3 (three) times daily as needed for muscle spasms.  08/27/19  Yes [provider]  escitalopram (LEXAPRO) 20 MG tablet Take 1 tablet by mouth every evening. 02/29/20  Yes [provider]  famotidine (PEPCID) 20 MG tablet Take 20 mg by mouth daily. 07/31/19  Yes [provider]  ferrous sulfate 325 (65 FE) MG tablet Take 325 mg by mouth 2 (two) times daily. 05/17/19  Yes [provider]  fluticasone (CUTIVATE) 0.005 % ointment Apply 1 application topically daily. 10/11/19  Yes [provider]  gabapentin (NEURONTIN) 100 MG capsule Take 1 capsule (100 mg total) by mouth 3 (three) times daily. 02/27/20  Yes Melvenia Beam, MD  hydrochlorothiazide (HYDRODIURIL) 25 MG tablet Take 1 tablet (25 mg total) by mouth daily. For high blood pressure. 02/01/20  Yes Ailene Ards, NP  hydroxychloroquine  (PLAQUENIL) 200 MG tablet Take 1 tablet (200 mg total) by mouth 2 (two) times daily. 02/20/20  Yes Ailene Ards, NP  hydrOXYzine (ATARAX/VISTARIL) 25 MG tablet Take 25 mg by mouth 2 (two) times daily. 02/01/20  Yes [provider]  ibuprofen (ADVIL) 800 MG tablet Take 1 tablet (800 mg total) by mouth every 8 (eight) hours as needed. 02/20/20  Yes Ailene Ards, NP  KLOR-CON M20 20 MEQ tablet TAKE 1 TABLET BY MOUTH DAILY ONLY WHEN YOU TAKE FUROSEMIDE. Patient taking differently: 20 mEq daily. Only when you take Furosemide. 03/05/20  Yes Gosrani, Nimish C, MD  mirtazapine (REMERON) 15 MG tablet Take 15 mg by mouth at bedtime as needed. 02/29/20  Yes [provider]  pantoprazole (PROTONIX) 40 MG tablet Take 1 tablet (40 mg total) by mouth daily. Take 30-60 min before first meal of the day 02/09/20  Yes Tanda Rockers, MD  prazosin (MINIPRESS) 1 MG capsule Take 1 mg by mouth at bedtime. 02/29/20  Yes [provider]  predniSONE (DELTASONE) 10 MG tablet 2 daily until 100% better then 1 daily Patient taking differently: Take 10 mg by mouth daily.  02/09/20  Yes Tanda Rockers, MD  QUEtiapine (SEROQUEL) 25 MG tablet Take 25 mg by mouth 2 (two) times daily.  09/05/19  Yes [provider]  SYMBICORT 160-4.5 MCG/ACT inhaler Inhale 1 puff into the lungs in the morning and at bedtime. 01/19/20  Yes [provider]  Tiotropium Bromide-Olodaterol (STIOLTO RESPIMAT) 2.5-2.5 MCG/ACT AERS Inhale 2 puffs into the lungs daily. 02/21/20  Yes Tanda Rockers, MD    Scheduled Meds: . arformoterol  15 mcg Nebulization BID  . budesonide (PULMICORT) nebulizer solution  0.25 mg Nebulization BID  . busPIRone  7.5 mg Oral BID  . chlorpheniramine-HYDROcodone  5 mL Oral Q6H  . enoxaparin (LOVENOX) injection  40 mg Subcutaneous Q24H  . escitalopram  10 mg Oral Daily  . ferrous sulfate  325 mg Oral BID  . gabapentin  300 mg Oral TID  . hydrochlorothiazide  25 mg Oral Daily  .  hydroxychloroquine  200 mg Oral BID  . insulin aspart  0-20 Units Subcutaneous TID WC  . insulin aspart  0-5 Units Subcutaneous QHS  . insulin aspart  10 Units Subcutaneous TID WC  . insulin detemir  10 Units Subcutaneous Daily  . montelukast  10 mg Oral QHS  . pantoprazole  40 mg Oral BID AC  . predniSONE  40 mg Oral Q breakfast   Continuous Infusions: . levETIRAcetam 500 mg (03/12/20 0922)   PRN Meds:.acetaminophen, albuterol, cyclobenzaprine, HYDROcodone-acetaminophen, ibuprofen, LORazepam, ondansetron (ZOFRAN) IV     Results for orders placed or performed during the hospital encounter of 03/07/20 (from the past 48 hour(s))  Glucose, capillary     Status: Abnormal   Collection Time: 03/10/20  8:32 PM  Result Value Ref Range   Glucose-Capillary 155 (H) 70 - 99 mg/dL    Comment: Glucose reference range applies only to samples taken after fasting for at least 8 hours.   Comment 1 Notify RN    Comment 2 Document in Chart   Basic metabolic panel     Status: Abnormal   Collection Time: 03/11/20  6:35 AM  Result Value Ref Range   Sodium 134 (L) 135 - 145 mmol/L   Potassium 3.9 3.5 - 5.1 mmol/L   Chloride 97 (L) 98 - 111 mmol/L   CO2 27 22 - 32 mmol/L   Glucose, Bld 258 (H) 70 - 99 mg/dL    Comment: Glucose reference range applies only to samples taken after fasting for at least 8 hours.  BUN 16 6 - 20 mg/dL   Creatinine, Ser 0.74 0.44 - 1.00 mg/dL   Calcium 8.5 (L) 8.9 - 10.3 mg/dL   GFR, Estimated >60 >60 mL/min   Anion gap 10 5 - 15    Comment: Performed at [facility], [address] [place], [place] [address]  Magnesium     Status: None   Collection Time: 03/11/20  6:35 AM  Result Value Ref Range   Magnesium 2.0 1.7 - 2.4 mg/dL    Comment: Performed at [facility], [address], [place], [place] [address]  CBC     Status: Abnormal   Collection Time: 03/11/20  6:35 AM  Result Value Ref Range   WBC 16.2 (H) 4.0 - 10.5 K/uL   RBC 4.92 3.87 - 5.11 MIL/uL   Hemoglobin  10.5 (L) 12.0 - 15.0 g/dL   HCT 36.0 36 - 46 %   MCV 73.2 (L) 80.0 - 100.0 fL   MCH 21.3 (L) 26.0 - 34.0 pg   MCHC 29.2 (L) 30.0 - 36.0 g/dL   RDW 17.1 (H) 11.5 - 15.5 %   Platelets 296 150 - 400 K/uL   nRBC 0.0 0.0 - 0.2 %    Comment: Performed at [facility], [address], [place], [place] [address]  Glucose, capillary     Status: Abnormal   Collection Time: 03/11/20  8:10 AM  Result Value Ref Range   Glucose-Capillary 257 (H) 70 - 99 mg/dL    Comment: Glucose reference range applies only to samples taken after fasting for at least 8 hours.  Glucose, capillary     Status: Abnormal   Collection Time: 03/11/20 11:35 AM  Result Value Ref Range   Glucose-Capillary 126 (H) 70 - 99 mg/dL    Comment: Glucose reference range applies only to samples taken after fasting for at least 8 hours.  Glucose, capillary     Status: Abnormal   Collection Time: 03/11/20  5:22 PM  Result Value Ref Range   Glucose-Capillary 113 (H) 70 - 99 mg/dL    Comment: Glucose reference range applies only to samples taken after fasting for at least 8 hours.  Glucose, capillary     Status: Abnormal   Collection Time: 03/11/20  9:03 PM  Result Value Ref Range   Glucose-Capillary 166 (H) 70 - 99 mg/dL    Comment: Glucose reference range applies only to samples taken after fasting for at least 8 hours.   Comment 1 Notify RN    Comment 2 Document in Chart   CBC     Status: Abnormal   Collection Time: 03/12/20  6:56 AM  Result Value Ref Range   WBC 14.0 (H) 4.0 - 10.5 K/uL   RBC 4.63 3.87 - 5.11 MIL/uL   Hemoglobin 9.9 (L) 12.0 - 15.0 g/dL   HCT 33.8 (L) 36 - 46 %   MCV 73.0 (L) 80.0 - 100.0 fL   MCH 21.4 (L) 26.0 - 34.0 pg   MCHC 29.3 (L) 30.0 - 36.0 g/dL   RDW 17.0 (H) 11.5 - 15.5 %   Platelets 270 150 - 400 K/uL   nRBC 0.0 0.0 - 0.2 %    Comment: Performed at [facility], [address], [place], [place] [address]  Basic metabolic panel     Status: Abnormal   Collection Time: 03/12/20  6:56 AM  Result  Value Ref Range   Sodium 138 135 - 145 mmol/L   Potassium 3.3 (L) 3.5 - 5.1 mmol/L  Chloride 99 98 - 111 mmol/L   CO2 31 22 - 32 mmol/L   Glucose, Bld 134 (H) 70 - 99 mg/dL    Comment: Glucose reference range applies only to samples taken after fasting for at least 8 hours.   BUN 17 6 - 20 mg/dL   Creatinine, Ser 0.76 0.44 - 1.00 mg/dL   Calcium 8.1 (L) 8.9 - 10.3 mg/dL   GFR, Estimated >60 >60 mL/min   Anion gap 8 5 - 15    Comment: Performed at [facility], [address] [place], [place] [address]  Glucose, capillary     Status: Abnormal   Collection Time: 03/12/20  8:03 AM  Result Value Ref Range   Glucose-Capillary 121 (H) 70 - 99 mg/dL    Comment: Glucose reference range applies only to samples taken after fasting for at least 8 hours.  Glucose, capillary     Status: Abnormal   Collection Time: 03/12/20 12:03 PM  Result Value Ref Range   Glucose-Capillary 164 (H) 70 - 99 mg/dL    Comment: Glucose reference range applies only to samples taken after fasting for at least 8 hours.  Glucose, capillary     Status: Abnormal   Collection Time: 03/12/20  5:00 PM  Result Value Ref Range   Glucose-Capillary 212 (H) 70 - 99 mg/dL    Comment: Glucose reference range applies only to samples taken after fasting for at least 8 hours.    Studies/Results:  EEG   IMPRESSION: This study is within normal limits. No seizures or epileptiform discharges were seen throughout the recording.    HEAD CT IMPRESSION: Normal head CT.       Gracey Tolle A. Merlene Laughter, M.D.  Diplomate, Tax adviser of Psychiatry and Neurology ( Neurology). 03/12/2020, 6:00 PM

## 2020-03-12 NOTE — Progress Notes (Signed)
PROGRESS NOTE    Elizabeth Bautista  ZYY:[id number] DOB: 06/05/1975 DOA: 03/07/2020 PCP: Wilson Singer, MD   Brief Narrative:  Per HPI: Elizabeth Reneis a 44 y.o.femalewith medical history significant forasthma, anemia, seizures, sleep apnea, type II DM, hypertension, GERD, rheumatoid arthritis who presents to the emergency department due to 2-week onset of shortness of breath which worsened within last 4 days. Patient complained of increased shortness of breath and wheezing which started about 2 weeks ago, she states that she followed up with her pulmonologist who prescribed her with Stiolto and prednisone with improved symptoms, she completed the prednisone about 4-5 days ago and shortness of breath increased and progressively worsenedsince last 4 days. This was associated with cough with occasional production of clear mucus and pleuritic chest pain (due to coughing). Home breathing treatment with nebulizer did not alleviate the patient's symptoms. Patient has a history of sleep apnea and she uses CPAP at night, but she states that the CPAP became nonfunctional about 3 weeks ago and she was in the process of obtaining another one.She had a virtual consult with her PCP today and patient was referred to the emergency department for further evaluation and management. She denies fever, chills, headache, nausea, vomiting or diarrhea. She has never been intubated due to asthma exacerbation. Patient had similar presentation in May of this year (5/18-5/23)and she was referred to a pulmonologist on discharge. This is the third hospital admission for asthma exacerbation history by patient.  10/7:Patient has been admitted with acute asthma exacerbation and was noted to previously in outpatient setting have upper airway cough syndrome. This has been ongoing for the last 2 weeks and she has failed outpatient treatment with prednisone. She is currently on BiPAP and still actively wheezing and having  difficulty breathing, although this is minimally improved. Started on duo nebs, Solu-Medrol, and azithromycin. Chest x-ray and Covid testing negative. Pulmonology consulted for assistance in management.  10/8: Patient continues to have some ongoing shortness of breath, but this is improving along with her cough. She is still not quite at baseline and is still requiring ongoing breathing treatments and IV steroids.  10/9:Patient unfortunately had experienced a brief seizure this morning with rapid response called. She received some Ativan and has been started on IV Keppra. EEG ordered. CT head with no acute findings. Shortness of breath has improved.  10/10: No further seizures noted overnight.  EEG still pending.  Shortness of breath improved.  Levemir started for hyperglycemia.  Taper steroids to oral prednisone   10/11: Patient overall doing well with EEG and neurology consultation pending.  No further seizures.  Assessment & Plan:   Principal Problem:   Asthma exacerbation Active Problems:   Sleep apnea   Depression   Rheumatoid arthritis (HCC)   Class 3 severe obesity with serious comorbidity and body mass index (BMI) of 45.0 to 49.9 in adult [facility])   Hypertension   Anemia   Upper airway cough syndrome   GERD (gastroesophageal reflux disease)   Anxiety   History of seizures   Hypokalemia   Acute asthma exacerbation   Acute asthma exacerbationwith upper airway cough syndrome-improving Currently with increased work of breathing on BiPAP requiring stepdown unit monitoring Consultation to pulmonologywith assistance appreciated DuoNebs as needed and outpatient taper to prednisone.  -DC azithromycin -Continue on gabapentin as prescribed Continue Protonix to prevent steroid-induced ulcer Continue incentive spirometry and flutter valve Continue Tussionex  Breakthrough seizure with prior history of seizures noted Likely related to recent tramadol use, discontinued  10/9, oxycodone given now instead for pain control as needed Patient will need outpatient neurology follow-up CT head with no acute findings EEG ordered and pending.   Seizure precautions with 2 mg Ativan IV as needed. Keppra loading and dosing initiated 10/9  Class3severe obesity and obstructive sleep apnea in the setting of asthma exacerbation Patient states that her CPAP became nonfunctional 3 weeks ago, symptoms started about 2 weeks ago. Continue CPAP at night BMI is 47.26 kg/meter squared Patient will be counseled on lifestyle modification, diet and exercise when more stable  Type 2 diabetes mellituswith hyperglycemia likely steroid-induced Increaseinsulin sliding scale and maintainhypoglycemia protocol Increase mealtime insulin to 10 units on 10/8 A1c during last admission was 6.6,this was 6.9 last month  Metformin will be held at this time -10 units Levemir daily added 10/10 for better blood glucose control, start tapering steroids to oral prednisone  Essential hypertension-stable Continue home HCTZ  GERD Continue Protonix  Anemia (microcytic) Continue ferrous sulfate 325 mg twice daily per home regimen  Rheumatoid arthritis Continue Plaquenil per home regimen  Depression/anxiety Continue buspirone, Lexaproperhome regimen  Chronic back pain Continue on Tylenol 650 mg p.o. every 6 as needed for mild pain Continue ibuprofen 600 mg p.o. every 8 hours as needed for moderate pain   DVT prophylaxis:Lovenox Code Status:Full code Family Communication:Discussed with son on phone 10/9 Disposition Plan: Status is: Inpatient  Remains inpatient appropriate because:IV treatments appropriate due to intensity of illness or inability to take PO and Inpatient level of care appropriate due to severity of illness   Dispo: The patient is from:Home Anticipated d/c is VP:XTGG Anticipated d/c date is:  1-2days Patient currently is not medically stable to d/c.Patientwill need ongoing monitoring for further seizure activity and has been started on medications. Continue breathing treatments.  Consultants:  Pulmonology  Neurology  Procedures:  See below  EEG pending  Antimicrobials:  Anti-infectives (From admission, onward)   Start     Dose/Rate Route Frequency Ordered Stop   03/08/20 1000  azithromycin (ZITHROMAX) tablet 250 mg       "Followed by" Linked Group Details   250 mg Oral Daily 03/07/20 2055 03/11/20 1201   03/07/20 2200  hydroxychloroquine (PLAQUENIL) tablet 200 mg        200 mg Oral 2 times daily 03/07/20 2028     03/07/20 2130  azithromycin (ZITHROMAX) tablet 500 mg       "Followed by" Linked Group Details   500 mg Oral Daily 03/07/20 2055 03/07/20 2120       Subjective: Patient seen and evaluated today with no new acute complaints or concerns. No acute concerns or events noted overnight.  No overnight seizures.  Breathing remains improved.  Objective: Vitals:   03/11/20 2014 03/11/20 2100 03/12/20 0609 03/12/20 0813  BP:  138/76 126/76   Pulse:  74 65   Resp: 18 18    Temp:  98.5 F (36.9 C) 98 F (36.7 C)   TempSrc:  Oral Oral   SpO2:  100% 100% 100%  Weight:      Height:        Intake/Output Summary (Last 24 hours) at 03/12/2020 1245 Last data filed at 03/12/2020 1100 Gross per 24 hour  Intake 360 ml  Output 800 ml  Net -440 ml   Filed Weights   03/07/20 1532 03/08/20 1623  Weight: 128.8 kg 129.5 kg    Examination:  General exam: Appears calm and comfortable, morbidly obese Respiratory system: Clear to auscultation. Respiratory effort normal.  Currently on CPAP. Cardiovascular system: S1 & S2 heard, RRR.  Gastrointestinal system: Abdomen is nondistended, soft and nontender.  Central nervous system: Alert and oriented. No focal neurological deficits. Extremities: Symmetric 5 x 5 power. Skin: No rashes, lesions or  ulcers Psychiatry: Judgement and insight appear normal. Mood & affect appropriate.     Data Reviewed: I have personally reviewed following labs and imaging studies  CBC: Recent Labs  Lab 03/07/20 1536 03/07/20 1536 03/08/20 0432 03/09/20 3329 03/10/20 0619 03/11/20 0635 03/12/20 0656  WBC 9.7   < > 13.7* 20.3* 20.4* 16.2* 14.0*  NEUTROABS 6.6  --   --   --   --   --   --   HGB 10.8*   < > 10.6* 10.5* 11.3* 10.5* 9.9*  HCT 35.2*   < > 34.9* 36.0 38.7 36.0 33.8*  MCV 71.7*   < > 72.3* 73.2* 73.3* 73.2* 73.0*  PLT 284   < > 281 299 330 296 270   < > = values in this interval not displayed.   Basic Metabolic Panel: Recent Labs  Lab 03/07/20 1536 03/08/20 0432 03/09/20 0633 03/11/20 0635 03/12/20 0656  NA 134* 132* 133* 134* 138  K 3.3* 4.5 4.9 3.9 3.3*  CL 100 101 102 97* 99  CO2 25 21* 23 27 31   GLUCOSE 109* 262* 270* 258* 134*  BUN 8 10 10 16 17   CREATININE 0.71 0.70 0.72 0.74 0.76  CALCIUM 8.7* 9.0 8.7* 8.5* 8.1*  MG  --  2.0 2.3 2.0  --    GFR: Estimated Creatinine Clearance: 120.6 mL/min (by C-G formula based on SCr of 0.76 mg/dL). Liver Function Tests: Recent Labs  Lab 03/07/20 1536 03/08/20 0432  AST 16 16  ALT 18 19  ALKPHOS 75 74  BILITOT 0.4 0.4  PROT 7.3 7.2  ALBUMIN 3.4* 3.4*   No results for input(s): LIPASE, AMYLASE in the last 168 hours. No results for input(s): AMMONIA in the last 168 hours. Coagulation Profile: Recent Labs  Lab 03/08/20 0432  INR 1.1   Cardiac Enzymes: No results for input(s): CKTOTAL, CKMB, CKMBINDEX, TROPONINI in the last 168 hours. BNP (last 3 results) No results for input(s): PROBNP in the last 8760 hours. HbA1C: No results for input(s): HGBA1C in the last 72 hours. CBG: Recent Labs  Lab 03/11/20 1135 03/11/20 1722 03/11/20 2103 03/12/20 0803 03/12/20 1203  GLUCAP 126* 113* 166* 121* 164*   Lipid Profile: No results for input(s): CHOL, HDL, LDLCALC, TRIG, CHOLHDL, LDLDIRECT in the last 72  hours. Thyroid Function Tests: No results for input(s): TSH, T4TOTAL, FREET4, T3FREE, THYROIDAB in the last 72 hours. Anemia Panel: No results for input(s): VITAMINB12, FOLATE, FERRITIN, TIBC, IRON, RETICCTPCT in the last 72 hours. Sepsis Labs: No results for input(s): PROCALCITON, LATICACIDVEN in the last 168 hours.  Recent Results (from the past 240 hour(s))  Resp Panel by RT PCR (RSV, Flu A&B, Covid) - Nasopharyngeal Swab     Status: None   Collection Time: 03/07/20  3:40 PM   Specimen: Nasopharyngeal Swab  Result Value Ref Range Status   SARS Coronavirus 2 by RT PCR NEGATIVE NEGATIVE Final    Comment: (NOTE) SARS-CoV-2 target nucleic acids are NOT DETECTED.  The SARS-CoV-2 RNA is generally detectable in upper respiratoy specimens during the acute phase of infection. The lowest concentration of SARS-CoV-2 viral copies this assay can detect is 131 copies/mL. A negative result does not preclude SARS-Cov-2 infection and should not be used as the sole basis  for treatment or other patient management decisions. A negative result may occur with  improper specimen collection/handling, submission of specimen other than nasopharyngeal swab, presence of viral mutation(s) within the areas targeted by this assay, and inadequate number of viral copies (<131 copies/mL). A negative result must be combined with clinical observations, patient history, and epidemiological information. The expected result is Negative.  Fact Sheet for Patients:  https://www.moore.com/  Fact Sheet for Healthcare Providers:  https://www.young.biz/  This test is no t yet approved or cleared by the [place] FDA and  has been authorized for detection and/or diagnosis of SARS-CoV-2 by FDA under an Emergency Use Authorization (EUA). This EUA will remain  in effect (meaning this test can be used) for the duration of the COVID-19 declaration under Section 564(b)(1) of the Act,  21 U.S.C. section 360bbb-3(b)(1), unless the authorization is terminated or revoked sooner.     Influenza A by PCR NEGATIVE NEGATIVE Final   Influenza B by PCR NEGATIVE NEGATIVE Final    Comment: (NOTE) The Xpert Xpress SARS-CoV-2/FLU/RSV assay is intended as an aid in  the diagnosis of influenza from Nasopharyngeal swab specimens and  should not be used as a sole basis for treatment. Nasal washings and  aspirates are unacceptable for Xpert Xpress SARS-CoV-2/FLU/RSV  testing.  Fact Sheet for Patients: https://www.moore.com/  Fact Sheet for Healthcare Providers: https://www.young.biz/  This test is not yet approved or cleared by the [place] FDA and  has been authorized for detection and/or diagnosis of SARS-CoV-2 by  FDA under an Emergency Use Authorization (EUA). This EUA will remain  in effect (meaning this test can be used) for the duration of the  Covid-19 declaration under Section 564(b)(1) of the Act, 21  U.S.C. section 360bbb-3(b)(1), unless the authorization is  terminated or revoked.    Respiratory Syncytial Virus by PCR NEGATIVE NEGATIVE Final    Comment: (NOTE) Fact Sheet for Patients: https://www.moore.com/  Fact Sheet for Healthcare Providers: https://www.young.biz/  This test is not yet approved or cleared by the [place] FDA and  has been authorized for detection and/or diagnosis of SARS-CoV-2 by  FDA under an Emergency Use Authorization (EUA). This EUA will remain  in effect (meaning this test can be used) for the duration of the  COVID-19 declaration under Section 564(b)(1) of the Act, 21 U.S.C.  section 360bbb-3(b)(1), unless the authorization is terminated or  revoked. Performed at [facility], [address], [place], [place] [address]          Radiology Studies: No results found.      Scheduled Meds: . arformoterol  15 mcg Nebulization BID  .  budesonide (PULMICORT) nebulizer solution  0.25 mg Nebulization BID  . busPIRone  7.5 mg Oral BID  . chlorpheniramine-HYDROcodone  5 mL Oral Q6H  . enoxaparin (LOVENOX) injection  40 mg Subcutaneous Q24H  . escitalopram  10 mg Oral Daily  . ferrous sulfate  325 mg Oral BID  . gabapentin  300 mg Oral TID  . hydrochlorothiazide  25 mg Oral Daily  . hydroxychloroquine  200 mg Oral BID  . insulin aspart  0-20 Units Subcutaneous TID WC  . insulin aspart  0-5 Units Subcutaneous QHS  . insulin aspart  10 Units Subcutaneous TID WC  . insulin detemir  10 Units Subcutaneous Daily  . montelukast  10 mg Oral QHS  . pantoprazole  40 mg Oral BID AC  . predniSONE  40 mg Oral Q breakfast   Continuous Infusions: . levETIRAcetam 500 mg (03/12/20  1308)     LOS: 4 days    Time spent: 30 minutes    Zilphia Kozinski Hoover Brunette, DO [facility]  If 7PM-7AM, please contact night-coverage [email or web address] 03/12/2020, 12:45 PM

## 2020-03-12 NOTE — Telephone Encounter (Signed)
per shannon at [facility] Patient is currently in the hospital and asked [place] to call back.  Let us know if she has the scan at the hospital

## 2020-03-12 NOTE — Procedures (Signed)
Patient Name: Elizabeth Bautista  MRN: [id number]  Epilepsy Attending: Charlsie Quest  Referring Physician/Provider: Dr. Maurilio Lovely Date: 03/12/2020 Duration: 27.42 minutes  Patient history: 44 year old female with history of epilepsy and with breakthrough seizures.  EEG to evaluate for seizures.  Level of alertness: Awake, asleep  AEDs during EEG study: Keppra, gabapentin  Technical aspects: This EEG study was done with scalp electrodes positioned according to the 10-20 International system of electrode placement. Electrical activity was acquired at a sampling rate of 500Hz  and reviewed with a high frequency filter of 70Hz  and a low frequency filter of 1Hz . EEG data were recorded continuously and digitally stored.   Description: The posterior dominant rhythm consists of 8 Hz activity of moderate voltage (25-35 uV) seen predominantly in posterior head regions, symmetric and reactive to eye opening and eye closing. Sleep was characterized by vertex waves, maximal frontocentral region.  Physiologic photic driving was not seen during photic stimulation.  Hyperventilation was not performed.     IMPRESSION: This study is within normal limits. No seizures or epileptiform discharges were seen throughout the recording.  Elizabeth Bautista 

## 2020-03-12 NOTE — Evaluation (Signed)
Physical Therapy Evaluation Patient Details Name: Elizabeth Bautista MRN: [id number] DOB: [birth year] Today's Date: 03/12/2020   History of Present Illness  Elizabeth Bautista is a 44 y.o. female with medical history significant for asthma, anemia, seizures, sleep apnea, type II DM, hypertension, GERD, rheumatoid arthritis who presents to the emergency department due to 2-week onset of shortness of breath which worsened within last 4 days.  Patient complained of increased shortness of breath and wheezing which started about 2 weeks ago, she states that she followed up with her pulmonologist who prescribed her with Stiolto and prednisone with improved symptoms, she completed the prednisone about 4-5 days ago and shortness of breath increased and progressively worsened since last 4 days.  This was associated with cough with occasional production of clear mucus and pleuritic chest pain (due to coughing).  Home breathing treatment with nebulizer did not alleviate the patient's symptoms. Patient has a history of sleep apnea and she uses CPAP at night, but she states that the CPAP became nonfunctional about 3 weeks ago and she was in the process of obtaining another one.  She had a virtual consult with her PCP today and patient was referred to the emergency department for further evaluation and management.  She denies fever, chills, headache, nausea, vomiting or diarrhea.  She has never been intubated due to asthma exacerbation.Patient had similar presentation in May of this year (5/18-5/23) and she was referred to a pulmonologist on discharge.  This is the third hospital admission for asthma exacerbation history by patient.    Clinical Impression  Patient demonstrates labored movement for sitting up at bedside, very unsteady on feet and limited to a few side steps at bedside due to generalized weakness and fall risk.  Patient tolerated sitting up in chair after therapy - RN notified.  Patient will benefit from continued  physical therapy in hospital and recommended venue below to increase strength, balance, endurance for safe ADLs and gait.     Follow Up Recommendations SNF;Supervision for mobility/OOB;Supervision - Intermittent    Equipment Recommendations  3in1 (PT) (4 wheeled Walker with seat and brakes, shower chair)    Recommendations for Other Services       Precautions / Restrictions Precautions Precautions: Fall Restrictions Weight Bearing Restrictions: No      Mobility  Bed Mobility Overal bed mobility: Needs Assistance Bed Mobility: Supine to Sit     Supine to sit: Supervision     General bed mobility comments: increased time, labored movement  Transfers Overall transfer level: Needs assistance   Transfers: Sit to/from Stand;Stand Pivot Transfers Sit to Stand: Min assist Stand pivot transfers: Min assist;Mod assist       General transfer comment: slow labored movement, has difficulty completing sit to stands due to BLE weakness  Ambulation/Gait Ambulation/Gait assistance: Mod assist Gait Distance (Feet): 5 Feet Assistive device: Rolling walker (2 wheeled) Gait Pattern/deviations: Decreased step length - right;Decreased step length - left;Decreased stride length Gait velocity: decreased   General Gait Details: limited to 5-6 slow labored side steps due to fatigue, poor standing balance  Stairs            Wheelchair Mobility    Modified Rankin (Stroke Patients Only)       Balance Overall balance assessment: Needs assistance Sitting-balance support: Feet supported;No upper extremity supported Sitting balance-Leahy Scale: Good Sitting balance - Comments: seated at EOB   Standing balance support: During functional activity;Single extremity supported Standing balance-Leahy Scale: Poor Standing balance comment: fair using RW  Pertinent Vitals/Pain Pain Assessment: 0-10 Pain Score: 8  Pain Location: posterior BLE,  chronic Pain Descriptors / Indicators: Aching;Discomfort Pain Intervention(s): Limited activity within patient's tolerance;Monitored during session    Home Living Family/patient expects to be discharged to:: Private residence Living Arrangements: Children Available Help at Discharge: Family;Available PRN/intermittently Type of Home: House Home Access: Stairs to enter Entrance Stairs-Rails: Left Entrance Stairs-Number of Steps: 4 Home Layout: Two level;Bed/bath upstairs;Able to live on main level with bedroom/bathroom Home Equipment: Gilmer Mor - single point      Prior Function Level of Independence: Independent with assistive device(s)         Comments: household ambulator using SPC     Hand Dominance        Extremity/Trunk Assessment   Upper Extremity Assessment Upper Extremity Assessment: Generalized weakness    Lower Extremity Assessment Lower Extremity Assessment: Generalized weakness    Cervical / Trunk Assessment Cervical / Trunk Assessment: Normal  Communication   Communication: No difficulties  Cognition Arousal/Alertness: Awake/alert Behavior During Therapy: WFL for tasks assessed/performed Overall Cognitive Status: Within Functional Limits for tasks assessed                                        General Comments      Exercises     Assessment/Plan    PT Assessment Patient needs continued PT services  PT Problem List Decreased strength;Decreased activity tolerance;Decreased balance;Decreased mobility       PT Treatment Interventions Balance training;Gait training;Stair training;Functional mobility training;Therapeutic activities;Therapeutic exercise;Patient/family education;DME instruction    PT Goals (Current goals can be found in the Care Plan section)  Acute Rehab PT Goals Patient Stated Goal: return home with family to assist PT Goal Formulation: With patient Time For Goal Achievement: 03/26/20 Potential to Achieve Goals:  Good    Frequency Min 3X/week   Barriers to discharge        Co-evaluation               AM-PAC PT "6 Clicks" Mobility  Outcome Measure Help needed turning from your back to your side while in a flat bed without using bedrails?: None Help needed moving from lying on your back to sitting on the side of a flat bed without using bedrails?: A Little Help needed moving to and from a bed to a chair (including a wheelchair)?: A Lot Help needed standing up from a chair using your arms (e.g., wheelchair or bedside chair)?: A Lot Help needed to walk in hospital room?: A Lot Help needed climbing 3-5 steps with a railing? : Total 6 Click Score: 14    End of Session   Activity Tolerance: Patient tolerated treatment well;Patient limited by fatigue Patient left: in chair;with call bell/phone within reach;with chair alarm set Nurse Communication: Mobility status PT Visit Diagnosis: Unsteadiness on feet (R26.81);Other abnormalities of gait and mobility (R26.89);Muscle weakness (generalized) (M62.81)    Time: 9622-2979 PT Time Calculation (min) (ACUTE ONLY): 29 min   Charges:   PT Evaluation $PT Eval Moderate Complexity: 1 Mod PT Treatments $Therapeutic Activity: 23-37 mins        11:42 AM, 03/12/20 Ocie Bob, MPT Physical Therapist with [facility] [phone] office [phone] mobile phone

## 2020-03-12 NOTE — Plan of Care (Signed)
  Problem: Acute Rehab PT Goals(only PT should resolve) Goal: Pt Will Go Supine/Side To Sit Outcome: Progressing Flowsheets (Taken 03/12/2020 1143) Pt will go Supine/Side to Sit:  with modified independence  Independently Goal: Patient Will Transfer Sit To/From Stand Outcome: Progressing Flowsheets (Taken 03/12/2020 1143) Patient will transfer sit to/from stand:  with supervision  with min guard assist Goal: Pt Will Transfer Bed To Chair/Chair To Bed Outcome: Progressing Flowsheets (Taken 03/12/2020 1143) Pt will Transfer Bed to Chair/Chair to Bed:  min guard assist  with min assist Goal: Pt Will Ambulate Outcome: Progressing Flowsheets (Taken 03/12/2020 1143) Pt will Ambulate:  25 feet  with minimal assist  with rolling walker   11:44 AM, 03/12/20 Ocie Bob, MPT Physical Therapist with [facility] [phone] office [phone] mobile phone

## 2020-03-12 NOTE — Plan of Care (Signed)

## 2020-03-13 ENCOUNTER — Ambulatory Visit (INDEPENDENT_AMBULATORY_CARE_PROVIDER_SITE_OTHER): Payer: Medicaid Other | Admitting: Internal Medicine

## 2020-03-13 LAB — BASIC METABOLIC PANEL
Anion gap: 9 (ref 5–15)
BUN: 11 mg/dL (ref 6–20)
CO2: 31 mmol/L (ref 22–32)
Calcium: 8.3 mg/dL — ABNORMAL LOW (ref 8.9–10.3)
Chloride: 99 mmol/L (ref 98–111)
Creatinine, Ser: 0.66 mg/dL (ref 0.44–1.00)
GFR, Estimated: >60 mL/min (ref >60)
Glucose, Bld: 100 mg/dL — ABNORMAL HIGH (ref 70–99)
Potassium: 3.3 mmol/L — ABNORMAL LOW (ref 3.5–5.1)
Sodium: 139 mmol/L (ref 135–145)

## 2020-03-13 LAB — GLUCOSE, CAPILLARY
Glucose-Capillary: 95 mg/dL (ref 70–99)
Glucose-Capillary: 124 mg/dL — ABNORMAL HIGH (ref 70–99)
Glucose-Capillary: 165 mg/dL — ABNORMAL HIGH (ref 70–99)
Glucose-Capillary: 160 mg/dL — ABNORMAL HIGH (ref 70–99)

## 2020-03-13 LAB — CBC
HCT: 35.2 % — ABNORMAL LOW (ref 36.0–46.0)
Hemoglobin: 10.3 g/dL — ABNORMAL LOW (ref 12.0–15.0)
MCH: 21.6 pg — ABNORMAL LOW (ref 26.0–34.0)
MCHC: 29.3 g/dL — ABNORMAL LOW (ref 30.0–36.0)
MCV: 73.9 fL — ABNORMAL LOW (ref 80.0–100.0)
Platelets: 283 10*3/uL (ref 150–400)
RBC: 4.76 MIL/uL (ref 3.87–5.11)
RDW: 17.2 % — ABNORMAL HIGH (ref 11.5–15.5)
WBC: 17.8 10*3/uL — ABNORMAL HIGH (ref 4.0–10.5)
nRBC: 0 % (ref 0.0–0.2)

## 2020-03-13 LAB — MAGNESIUM: Magnesium: 2.1 mg/dL (ref 1.7–2.4)

## 2020-03-13 MED ORDER — LEVETIRACETAM 500 MG PO TABS: 500.0000 mg | ORAL_TABLET | Freq: Two times a day (BID) | ORAL | 2 refills | Status: DC

## 2020-03-13 MED ORDER — ESCITALOPRAM OXALATE 10 MG PO TABS: 10.0000 mg | ORAL_TABLET | Freq: Every day | ORAL | 2 refills | Status: DC

## 2020-03-13 MED ORDER — MONTELUKAST SODIUM 10 MG PO TABS: 10.0000 mg | ORAL_TABLET | Freq: Every day | ORAL | 0 refills | Status: DC

## 2020-03-13 MED ORDER — PREDNISONE 20 MG PO TABS: 40.0000 mg | ORAL_TABLET | Freq: Every day | ORAL | 0 refills | Status: AC

## 2020-03-13 NOTE — Progress Notes (Signed)
Called to pt's room by another nurse, states pt is having a seizure. Arrived to room to find pt flat in bed, arms and legs shaking, head shaking. Pt did not respond to name called, sternal rub done and pt stopped shaking. Attempted to open pt's eyes but unable, eyelids fluttering. Ativan IV given per order. Asked pt her name and she was able to speak both her name and her birthday. Pt states, "What happened?". Advised by other staff in room that she had a seizure. Pt states, "Oh no, I was waiting on my ride!".  VSS.  Pt states, "I think I wet myself". Checked peri area, purewick in place/ Pt is on her menses, towel noted to have blood on it. Pt provided peri-care. Pt now alert and oriented, no resp difficulty noted. MD Sherryll Burger notified.

## 2020-03-13 NOTE — Discharge Summary (Signed)
Physician Discharge Summary  Elizabeth Bautista CXK:[id number] DOB: Jan 21, 1976 DOA: 03/07/2020  PCP: Doree Albee, MD  Admit date: 03/07/2020  Discharge date: 03/13/2020  Admitted From:Home  Disposition:  Home  Recommendations for Outpatient Follow-up:  1. Follow up with PCP in 1-2 weeks 2. Please obtain BMP/CBC in one week 3. Follow-up with pulmonology, Dr. Melvyn Novas on 11/1 as scheduled 4. Sleep study scheduled on 9/30 which patient will require to consider need for CPAP versus BiPAP machine at home 5. Follow-up with neurology Dr. Jaynee Eagles as previously scheduled and continue on Keppra 500 twice daily as prescribed in hospital after seizure 6. Continue on prednisone for few more days and breathing treatments at home as needed 7. Follow-up with allergist outpatient as scheduled 8. Continue on gabapentin and GERD treatment for treatment of upper airway cough syndrome as prescribed by pulmonology  Home Health: None, outpatient PT  Equipment/Devices: Patient given walker with seat, 3 n 1, and tub bench  Discharge Condition: Stable  CODE STATUS: Full  Diet recommendation: Heart Healthy/carb modified  Brief/Interim Summary: Per HPI: Elizabeth Bautista a 43 y.o.femalewith medical history significant forasthma, anemia, seizures, sleep apnea, type II DM, hypertension, GERD, rheumatoid arthritis who presents to the emergency department due to 2-week onset of shortness of breath which worsened within last 4 days. Patient complained of increased shortness of breath and wheezing which started about 2 weeks ago, she states that she followed up with her pulmonologist who prescribed her with Stiolto and prednisone with improved symptoms, she completed the prednisone about 4-5 days ago and shortness of breath increased and progressively worsenedsince last 4 days. This was associated with cough with occasional production of clear mucus and pleuritic chest pain (due to coughing). Home breathing treatment with  nebulizer did not alleviate the patient's symptoms. Patient has a history of sleep apnea and she uses CPAP at night, but she states that the CPAP became nonfunctional about 3 weeks ago and she was in the process of obtaining another one.She had a virtual consult with her PCP today and patient was referred to the emergency department for further evaluation and management. She denies fever, chills, headache, nausea, vomiting or diarrhea. She has never been intubated due to asthma exacerbation. Patient had similar presentation in May of this year (5/18-5/23)and she was referred to a pulmonologist on discharge. This is the third hospital admission for asthma exacerbation history by patient.  Patient has been admitted with acute asthma exacerbation and was noted to previously in outpatient setting have upper airway cough syndrome. This has been ongoing for the last 2 weeks and she has failed outpatient treatment with prednisone. She is currently on BiPAP and still actively wheezing and having difficulty breathing, although this is minimally improved. Started on duo nebs, Solu-Medrol, and azithromycin. Chest x-ray and Covid testing negative. Pulmonology consulted for assistance in management.  She was being treated by pulmonology and then experienced a seizure on 10/9.  This was likely due to tramadol use in the inpatient setting.  CT of the head with no acute findings noted and EEG was ordered which was not performed for several days due to lack of EEG over the weekend.  She remained on Keppra IV with no further seizure activity noted and EEG eventually performed on 10/11 with no seizure activity noted.  Discussed case with neurology, Dr. Merlene Laughter who agrees that patient should remain on Keppra twice daily and follow-up with her neurologist which has been scheduled in the outpatient setting.  She remained stable from a  respiratory standpoint and there is concern that she may require CPAP/BiPAP in the  outpatient setting and sleep study has been scheduled on 9/30 with further follow-up to pulmonology on 11/1 noted.  She will remain on prednisone as prescribed for several more days and breathing treatments as needed.  She is also on gabapentin and GERD therapy for upper airway cough syndrome.  She is stable for discharge today and refuses inpatient rehabilitation and will follow up with outpatient PT.   Discharge Diagnoses:  Principal Problem:   Asthma exacerbation Active Problems:   Sleep apnea   Depression   Rheumatoid arthritis ([place])   Class 3 severe obesity with serious comorbidity and body mass index (BMI) of 45.0 to 49.9 in adult [facility])   Hypertension   Anemia   Upper airway cough syndrome   GERD (gastroesophageal reflux disease)   Anxiety   History of seizures   Hypokalemia   Acute asthma exacerbation  Principal discharge diagnosis: Acute asthma exacerbation with upper airway cough syndrome along with breakthrough seizure.  Discharge Instructions  Discharge Instructions    Ambulatory referral to Physical Therapy   Complete by: As directed    Diet - low sodium heart healthy   Complete by: As directed    Increase activity slowly   Complete by: As directed      Allergies as of 03/13/2020      Reactions   Phenytoin Anaphylaxis   Other reaction(s): swelling and heart rate decreased   Phenylbutazones       Medication List    TAKE these medications   acetaminophen 500 MG chewable tablet Commonly known as: TYLENOL Chew 1 tablet (500 mg total) by mouth every 8 (eight) hours as needed for pain.   albuterol 108 (90 Base) MCG/ACT inhaler Commonly known as: ProAir HFA 2 puffs every 4 hours as needed only  if your can't catch your breath   blood glucose meter kit and supplies Dispense based on patient and insurance preference. Use up to four times daily as directed. (FOR ICD-10 E10.9, E11.9).   busPIRone 7.5 MG tablet Commonly known as: BUSPAR Take 7.5 mg by mouth 2  (two) times daily.   Cholecalciferol 1.25 MG (50000 UT) Tabs Take 1 tablet by mouth daily.   cyclobenzaprine 10 MG tablet Commonly known as: FLEXERIL Take 10 mg by mouth 3 (three) times daily as needed for muscle spasms.   escitalopram 20 MG tablet Commonly known as: LEXAPRO Take 1 tablet by mouth every evening. What changed: Another medication with the same name was changed. Make sure you understand how and when to take each.   escitalopram 10 MG tablet Commonly known as: LEXAPRO Take 1 tablet (10 mg total) by mouth daily. What changed: when to take this   famotidine 20 MG tablet Commonly known as: PEPCID Take 20 mg by mouth daily.   ferrous sulfate 325 (65 FE) MG tablet Take 325 mg by mouth 2 (two) times daily.   fluticasone 0.005 % ointment Commonly known as: CUTIVATE Apply 1 application topically daily.   gabapentin 100 MG capsule Commonly known as: Neurontin Take 1 capsule (100 mg total) by mouth 3 (three) times daily.   hydrochlorothiazide 25 MG tablet Commonly known as: HYDRODIURIL Take 1 tablet (25 mg total) by mouth daily. For high blood pressure.   hydroxychloroquine 200 MG tablet Commonly known as: PLAQUENIL Take 1 tablet (200 mg total) by mouth 2 (two) times daily.   hydrOXYzine 25 MG tablet Commonly known as: ATARAX/VISTARIL Take 25 mg  by mouth 2 (two) times daily.   ibuprofen 800 MG tablet Commonly known as: ADVIL Take 1 tablet (800 mg total) by mouth every 8 (eight) hours as needed.   Klor-Con M20 20 MEQ tablet Generic drug: potassium chloride SA TAKE 1 TABLET BY MOUTH DAILY ONLY WHEN YOU TAKE FUROSEMIDE. What changed: See the new instructions.   levETIRAcetam 500 MG tablet Commonly known as: Keppra Take 1 tablet (500 mg total) by mouth 2 (two) times daily.   mirtazapine 15 MG tablet Commonly known as: REMERON Take 15 mg by mouth at bedtime as needed.   montelukast 10 MG tablet Commonly known as: SINGULAIR Take 1 tablet (10 mg total) by  mouth at bedtime.   pantoprazole 40 MG tablet Commonly known as: Protonix Take 1 tablet (40 mg total) by mouth daily. Take 30-60 min before first meal of the day   prazosin 1 MG capsule Commonly known as: MINIPRESS Take 1 mg by mouth at bedtime.   predniSONE 20 MG tablet Commonly known as: DELTASONE Take 2 tablets (40 mg total) by mouth daily with breakfast for 3 days. What changed:   medication strength  how much to take  how to take this  when to take this  additional instructions   QUEtiapine 25 MG tablet Commonly known as: SEROQUEL Take 25 mg by mouth 2 (two) times daily.   Sphygmomanometer Misc 1 each by Does not apply route daily.   Stiolto Respimat 2.5-2.5 MCG/ACT Aers Generic drug: Tiotropium Bromide-Olodaterol Inhale 2 puffs into the lungs daily.   Symbicort 160-4.5 MCG/ACT inhaler Generic drug: budesonide-formoterol Inhale 1 puff into the lungs in the morning and at bedtime.            Durable Medical Equipment  (From admission, onward)         Start     Ordered   03/12/20 1305  For home use only DME Tub bench  Once       Comments: Tub seat with back.   03/12/20 1304   03/12/20 1304  For home use only DME 3 n 1  Once        03/12/20 1304   03/12/20 1304  For home use only DME 4 wheeled rolling walker with seat  Once       Question:  Patient needs a walker to treat with the following condition  Answer:  Weakness   03/12/20 1304          Follow-up Information    Doree Albee, MD Follow up in 1 week(s).   Specialty: Internal Medicine Contact information: [place] [address] [phone]        Melvenia Beam, MD Follow up in 2 week(s).   Specialty: Neurology Contact information: [place] [address] [phone]        Tanda Rockers, MD Follow up on 04/02/2020.   Specialty: Pulmonary Disease Contact information: [place] [address] [phone]               Allergies  Allergen Reactions  . Phenytoin Anaphylaxis    Other reaction(s): swelling and heart rate decreased  . Phenylbutazones     Consultations:  Pulmonology  Neurology   Procedures/Studies: CT HEAD WO CONTRAST  Result Date: 03/10/2020 CLINICAL DATA:  Nontraumatic seizure EXAM: CT HEAD WITHOUT CONTRAST TECHNIQUE: Contiguous axial images were obtained from the base of the skull through the vertex without intravenous contrast. COMPARISON:  None.  FINDINGS: Brain: The brain shows a normal appearance without evidence of malformation, atrophy, old or acute small or large vessel infarction, mass lesion, hemorrhage, hydrocephalus or extra-axial collection. Vascular: No hyperdense vessel. No evidence of atherosclerotic calcification. Skull: Normal.  No traumatic finding.  No focal bone lesion. Sinuses/Orbits: Sinuses are clear. Orbits appear normal. Mastoids are clear. Other: None significant IMPRESSION: Normal head CT. Electronically Signed   By: Nelson Chimes M.D.   On: 03/10/2020 10:39   DG Chest Portable 1 View  Result Date: 03/07/2020 CLINICAL DATA:  Cough, chest pain. EXAM: PORTABLE CHEST 1 VIEW COMPARISON:  Oct 22, 2019 FINDINGS: The heart size and mediastinal contours are within normal limits. No consolidation. No visible pleural effusions or pneumothorax. Low lung volumes. The visualized skeletal structures are unremarkable. IMPRESSION: No active disease. Electronically Signed   By: Margaretha Sheffield MD   On: 03/07/2020 16:20   EEG adult  Result Date: 03/12/2020 Lora Havens, MD     03/12/2020  3:20 PM Patient Name: Elizabeth Bautista MRN: [id number] Epilepsy Attending: Lora Havens Referring Physician/Provider: Dr. Heath Lark Date: 03/12/2020 Duration: 27.42 minutes Patient history: 44 year old female with history of epilepsy and with breakthrough seizures.  EEG to evaluate for seizures. Level of alertness: Awake, asleep AEDs during EEG study: Keppra, gabapentin  Technical aspects: This EEG study was done with scalp electrodes positioned according to the 10-20 International system of electrode placement. Electrical activity was acquired at a sampling rate of $Remov'500Hz'LmztPc$  and reviewed with a high frequency filter of $RemoveB'70Hz'IUQsZvQT$  and a low frequency filter of $RemoveB'1Hz'xJEBMqPT$ . EEG data were recorded continuously and digitally stored. Description: The posterior dominant rhythm consists of 8 Hz activity of moderate voltage (25-35 uV) seen predominantly in posterior head regions, symmetric and reactive to eye opening and eye closing. Sleep was characterized by vertex waves, maximal frontocentral region.  Physiologic photic driving was not seen during photic stimulation.  Hyperventilation was not performed.   IMPRESSION: This study is within normal limits. No seizures or epileptiform discharges were seen throughout the recording. Lora Havens     Discharge Exam: Vitals:   03/13/20 0736 03/13/20 0743  BP:    Pulse:    Resp:    Temp:    SpO2: 99% 99%   Vitals:   03/12/20 2203 03/13/20 0645 03/13/20 0736 03/13/20 0743  BP: (!) 141/68 130/65    Pulse: 73 70    Resp:  18    Temp: 98.1 F (36.7 C) 98 F (36.7 C)    TempSrc: Oral     SpO2: 98% 98% 99% 99%  Weight:      Height:        General: Pt is alert, awake, not in acute distress, morbidly obese Cardiovascular: RRR, S1/S2 +, no rubs, no gallops Respiratory: CTA bilaterally, no wheezing, no rhonchi Abdominal: Soft, NT, ND, bowel sounds + Extremities: no edema, no cyanosis    The results of significant diagnostics from this hospitalization (including imaging, microbiology, ancillary and laboratory) are listed below for reference.     Microbiology: Recent Results (from the past 240 hour(s))  Resp Panel by RT PCR (RSV, Flu A&B, Covid) - Nasopharyngeal Swab     Status: None   Collection Time: 03/07/20  3:40 PM   Specimen: Nasopharyngeal Swab  Result Value Ref Range Status   SARS Coronavirus 2 by RT PCR NEGATIVE  NEGATIVE Final    Comment: (NOTE) SARS-CoV-2 target nucleic acids are NOT DETECTED.  The SARS-CoV-2 RNA is generally detectable in upper  respiratoy specimens during the acute phase of infection. The lowest concentration of SARS-CoV-2 viral copies this assay can detect is 131 copies/mL. A negative result does not preclude SARS-Cov-2 infection and should not be used as the sole basis for treatment or other patient management decisions. A negative result may occur with  improper specimen collection/handling, submission of specimen other than nasopharyngeal swab, presence of viral mutation(s) within the areas targeted by this assay, and inadequate number of viral copies (<131 copies/mL). A negative result must be combined with clinical observations, patient history, and epidemiological information. The expected result is Negative.  Fact Sheet for Patients:  PinkCheek.be  Fact Sheet for Healthcare Providers:  GravelBags.it  This test is no t yet approved or cleared by the [place] FDA and  has been authorized for detection and/or diagnosis of SARS-CoV-2 by FDA under an Emergency Use Authorization (EUA). This EUA will remain  in effect (meaning this test can be used) for the duration of the COVID-19 declaration under Section 564(b)(1) of the Act, 21 U.S.C. section 360bbb-3(b)(1), unless the authorization is terminated or revoked sooner.     Influenza A by PCR NEGATIVE NEGATIVE Final   Influenza B by PCR NEGATIVE NEGATIVE Final    Comment: (NOTE) The Xpert Xpress SARS-CoV-2/FLU/RSV assay is intended as an aid in  the diagnosis of influenza from Nasopharyngeal swab specimens and  should not be used as a sole basis for treatment. Nasal washings and  aspirates are unacceptable for Xpert Xpress SARS-CoV-2/FLU/RSV  testing.  Fact Sheet for Patients: PinkCheek.be  Fact Sheet for Healthcare  Providers: GravelBags.it  This test is not yet approved or cleared by the [place] FDA and  has been authorized for detection and/or diagnosis of SARS-CoV-2 by  FDA under an Emergency Use Authorization (EUA). This EUA will remain  in effect (meaning this test can be used) for the duration of the  Covid-19 declaration under Section 564(b)(1) of the Act, 21  U.S.C. section 360bbb-3(b)(1), unless the authorization is  terminated or revoked.    Respiratory Syncytial Virus by PCR NEGATIVE NEGATIVE Final    Comment: (NOTE) Fact Sheet for Patients: PinkCheek.be  Fact Sheet for Healthcare Providers: GravelBags.it  This test is not yet approved or cleared by the [place] FDA and  has been authorized for detection and/or diagnosis of SARS-CoV-2 by  FDA under an Emergency Use Authorization (EUA). This EUA will remain  in effect (meaning this test can be used) for the duration of the  COVID-19 declaration under Section 564(b)(1) of the Act, 21 U.S.C.  section 360bbb-3(b)(1), unless the authorization is terminated or  revoked. Performed at [facility], [address] [place], [place] [address]      Labs: BNP (last 3 results) No results for input(s): BNP in the last 8760 hours. Basic Metabolic Panel: Recent Labs  Lab 03/08/20 0432 03/09/20 0633 03/11/20 0635 03/12/20 0656 03/13/20 0619  NA 132* 133* 134* 138 139  K 4.5 4.9 3.9 3.3* 3.3*  CL 101 102 97* 99 99  CO2 21* _0 GLUCOSE 262* 270* 258* 134* 100*  BUN _1 CREATININE 0.70 0.72 0.74 0.76 0.66  CALCIUM 9.0 8.7* 8.5* 8.1* 8.3*  MG 2.0 2.3 2.0  --  2.1   Liver Function Tests: Recent Labs  Lab 03/07/20 1536 03/08/20 0432  AST 16 16  ALT 18 19  ALKPHOS 75 74  BILITOT 0.4 0.4  PROT 7.3 7.2  ALBUMIN 3.4* 3.4*  No results for input(s): LIPASE, AMYLASE in the last 168 hours. No results for input(s):  AMMONIA in the last 168 hours. CBC: Recent Labs  Lab 03/07/20 1536 03/08/20 0432 03/09/20 3825 03/10/20 0619 03/11/20 0635 03/12/20 0656 03/13/20 0619  WBC 9.7   < > 20.3* 20.4* 16.2* 14.0* 17.8*  NEUTROABS 6.6  --   --   --   --   --   --   HGB 10.8*   < > 10.5* 11.3* 10.5* 9.9* 10.3*  HCT 35.2*   < > 36.0 38.7 36.0 33.8* 35.2*  MCV 71.7*   < > 73.2* 73.3* 73.2* 73.0* 73.9*  PLT 284   < > 299 330 296 270 283   < > = values in this interval not displayed.   Cardiac Enzymes: No results for input(s): CKTOTAL, CKMB, CKMBINDEX, TROPONINI in the last 168 hours. BNP: Invalid input(s): POCBNP CBG: Recent Labs  Lab 03/12/20 0803 03/12/20 1203 03/12/20 1700 03/12/20 2205 03/13/20 0803  GLUCAP 121* 164* 212* 214* 95   D-Dimer No results for input(s): DDIMER in the last 72 hours. Hgb A1c No results for input(s): HGBA1C in the last 72 hours. Lipid Profile No results for input(s): CHOL, HDL, LDLCALC, TRIG, CHOLHDL, LDLDIRECT in the last 72 hours. Thyroid function studies No results for input(s): TSH, T4TOTAL, T3FREE, THYROIDAB in the last 72 hours.  Invalid input(s): FREET3 Anemia work up No results for input(s): VITAMINB12, FOLATE, FERRITIN, TIBC, IRON, RETICCTPCT in the last 72 hours. Urinalysis    Component Value Date/Time   COLORURINE YELLOW 10/19/2019 1549   APPEARANCEUR CLEAR 10/19/2019 1549   LABSPEC 1.030 10/19/2019 1549   PHURINE 5.0 10/19/2019 1549   GLUCOSEU 150 (A) 10/19/2019 1549   HGBUR NEGATIVE 10/19/2019 1549   BILIRUBINUR NEGATIVE 10/19/2019 1549   KETONESUR 5 (A) 10/19/2019 1549   PROTEINUR NEGATIVE 10/19/2019 1549   NITRITE NEGATIVE 10/19/2019 1549   LEUKOCYTESUR NEGATIVE 10/19/2019 1549   Sepsis Labs Invalid input(s): PROCALCITONIN,  WBC,  LACTICIDVEN Microbiology Recent Results (from the past 240 hour(s))  Resp Panel by RT PCR (RSV, Flu A&B, Covid) - Nasopharyngeal Swab     Status: None   Collection Time: 03/07/20  3:40 PM   Specimen:  Nasopharyngeal Swab  Result Value Ref Range Status   SARS Coronavirus 2 by RT PCR NEGATIVE NEGATIVE Final    Comment: (NOTE) SARS-CoV-2 target nucleic acids are NOT DETECTED.  The SARS-CoV-2 RNA is generally detectable in upper respiratoy specimens during the acute phase of infection. The lowest concentration of SARS-CoV-2 viral copies this assay can detect is 131 copies/mL. A negative result does not preclude SARS-Cov-2 infection and should not be used as the sole basis for treatment or other patient management decisions. A negative result may occur with  improper specimen collection/handling, submission of specimen other than nasopharyngeal swab, presence of viral mutation(s) within the areas targeted by this assay, and inadequate number of viral copies (<131 copies/mL). A negative result must be combined with clinical observations, patient history, and epidemiological information. The expected result is Negative.  Fact Sheet for Patients:  https://www.moore.com/  Fact Sheet for Healthcare Providers:  https://www.young.biz/  This test is no t yet approved or cleared by the [place] FDA and  has been authorized for detection and/or diagnosis of SARS-CoV-2 by FDA under an Emergency Use Authorization (EUA). This EUA will remain  in effect (meaning this test can be used) for the duration of the COVID-19 declaration under Section 564(b)(1) of the Act, 21 U.S.C. section  360bbb-3(b)(1), unless the authorization is terminated or revoked sooner.     Influenza A by PCR NEGATIVE NEGATIVE Final   Influenza B by PCR NEGATIVE NEGATIVE Final    Comment: (NOTE) The Xpert Xpress SARS-CoV-2/FLU/RSV assay is intended as an aid in  the diagnosis of influenza from Nasopharyngeal swab specimens and  should not be used as a sole basis for treatment. Nasal washings and  aspirates are unacceptable for Xpert Xpress SARS-CoV-2/FLU/RSV  testing.  Fact Sheet  for Patients: PinkCheek.be  Fact Sheet for Healthcare Providers: GravelBags.it  This test is not yet approved or cleared by the [place] FDA and  has been authorized for detection and/or diagnosis of SARS-CoV-2 by  FDA under an Emergency Use Authorization (EUA). This EUA will remain  in effect (meaning this test can be used) for the duration of the  Covid-19 declaration under Section 564(b)(1) of the Act, 21  U.S.C. section 360bbb-3(b)(1), unless the authorization is  terminated or revoked.    Respiratory Syncytial Virus by PCR NEGATIVE NEGATIVE Final    Comment: (NOTE) Fact Sheet for Patients: PinkCheek.be  Fact Sheet for Healthcare Providers: GravelBags.it  This test is not yet approved or cleared by the [place] FDA and  has been authorized for detection and/or diagnosis of SARS-CoV-2 by  FDA under an Emergency Use Authorization (EUA). This EUA will remain  in effect (meaning this test can be used) for the duration of the  COVID-19 declaration under Section 564(b)(1) of the Act, 21 U.S.C.  section 360bbb-3(b)(1), unless the authorization is terminated or  revoked. Performed at [facility], [address] [place], [place] [address]      Time coordinating discharge: 35 minutes  SIGNED:   Rodena Goldmann, DO [facility] 03/13/2020, 9:34 AM  If 7PM-7AM, please contact night-coverage [email or web address]

## 2020-03-13 NOTE — Progress Notes (Signed)
Orthostatic vitals completed per MD request. Pt with elevated heart rate with standing, B/P also elevated but pt was gripping walker so tightly to remain standing that the muscle tension may have affected reading. No drop in B/P. Pt tolerated fair, states, "I just feel so weak, I just can't keep standing." MD Sherryll Burger notified of orthostatic vital signs results.  Pt currently eating her chicken sandwich with no other c/o.

## 2020-03-13 NOTE — Progress Notes (Addendum)
Pt OOB to ambulate with four wheel walker. Pt able to get OOB and stand on her own, ambulated approximately 5 feet then states, "I'm too weak" and her knees buckled. Pt states she twisted her left knee at this time and that is her bad knee. Pt states unable to walk back to bed. Recliner chair brought behind patient and patient able to seat herself in chair.  Addendum: Central tele phoned during this time and stated that pt's heart rate was up to 140/min. Once pt in chair and rested, heart rate down into 90's.

## 2020-03-13 NOTE — TOC Progression Note (Signed)
Transition of Care [facility]) - Progression Note    Patient Details  Name: Elizabeth Bautista MRN: [id number] Date of Birth: [birth year]  Transition [facility]) CM/SW Contact  Elliot Gault, LCSW Phone Number: 03/13/2020, 4:28 PM  Clinical Narrative:     TOC following. Pt stable for dc today per MD. Pt had some new symptoms and new evaluations were ordered. Pt then could only walk 5 feet and was buckling. Spoke with pt to discuss SNF rehab and she again declines for TOC to refer to SNF stating, "I want to go home." Reviewed pt's inability to tolerate much ambulation and she continued to state that she wants to go home.   DME was delivered to pt's room today for dc. Outpatient PT was previously arranged. MD aware of above. Discussed with RN.  Expected Discharge Plan: OP Rehab Barriers to Discharge: Continued Medical Work up  Expected Discharge Plan and Services Expected Discharge Plan: OP Rehab In-house Referral: Clinical Social Work Discharge Planning Services: NA Post Acute Care Choice: NA Living arrangements for the past 2 months: Single Family Home Expected Discharge Date: 03/13/20               DME Arranged: 3-N-1, Shower stool, Walker rolling with seat DME Agency: [employer] Date DME Agency Contacted: 03/12/20 Time DME Agency Contacted: 1136 Representative spoke with at DME Agency: [facility] Arranged: NA HH Agency: NA         Social Determinants of Health (SDOH) Interventions    Readmission Risk Interventions Readmission Risk Prevention Plan 03/12/2020  Transportation Screening Complete  HRI or Home Care Consult Complete  Social Work Consult for Recovery Care Planning/Counseling Complete  Palliative Care Screening Not Applicable  Medication Review Oceanographer) Complete

## 2020-03-13 NOTE — Progress Notes (Signed)
Pt sitting up in bed. Asked dietary staff to bring her chicken tenders and fries because she didn't like what was served for lunch. Denies c/o.

## 2020-03-13 NOTE — Progress Notes (Signed)
In to pt's room, pt lying in bed with eyes closed. Alert and oriented x4. Advised pt that MD wanted her to ambulate in room and verify equipment appropriateness (pt has had 4 wheel walker, BSC and shower chair delivered to her room.) Pt up on side of bed with much encouragement, able to stand and walked 3 steps then back to bed. Pt states she feels "dizzy" when she stood up, advised probably from IV ativan she received earlier. Pt states, "Did you call my son?" When I asked pt what she wanted me to call her son about, she stated, "To tell him I had another seizure." I advised her that I did not, but she could call her son and if he had any questions he could call me. Pt states, "I don't know about going home today." Advised pt that MD Sherryll Burger had consulted with Dr. Gerilyn Pilgrim neurologist and both were OK for pt to proceed with discharged to home. Pt states, "Well, I'm having chest pain." She first stated she had similar chest pain after her previous seizures, but then stated no, she had not had anything like this pain before. States she thinks she hit her chest on the side rails during her seizure. Describes as heaviness in center of chest. VSS, skin warm and dry, no SOB or n/v. MD Sherryll Burger notified and order received for EKG.

## 2020-03-14 DIAGNOSIS — E662 Morbid (severe) obesity with alveolar hypoventilation: Secondary | ICD-10-CM

## 2020-03-14 LAB — GLUCOSE, CAPILLARY
Glucose-Capillary: 126 mg/dL — ABNORMAL HIGH (ref 70–99)
Glucose-Capillary: 164 mg/dL — ABNORMAL HIGH (ref 70–99)

## 2020-03-14 LAB — CREATININE, SERUM
Creatinine, Ser: 0.66 mg/dL (ref 0.44–1.00)
GFR, Estimated: >60 mL/min (ref >60)

## 2020-03-14 MED ORDER — LEVETIRACETAM 500 MG PO TABS: 500.0000 mg | ORAL_TABLET | Freq: Two times a day (BID) | ORAL | Status: DC

## 2020-03-14 NOTE — Progress Notes (Signed)
Discharged via stretcher by [facility] to home with all personal belongings in her possession. Pt's children came earlier and got her DME equipment (BSC, shower chair and walker).

## 2020-03-14 NOTE — Progress Notes (Signed)
Physical Therapy Treatment Patient Details Name: Elizabeth Bautista MRN: [id number] DOB: 1976/04/23 Today's Date: 03/14/2020    History of Present Illness Elizabeth Bautista is a 44 y.o. female with medical history significant for asthma, anemia, seizures, sleep apnea, type II DM, hypertension, GERD, rheumatoid arthritis who presents to the emergency department due to 2-week onset of shortness of breath which worsened within last 4 days.  Patient complained of increased shortness of breath and wheezing which started about 2 weeks ago, she states that she followed up with her pulmonologist who prescribed her with Stiolto and prednisone with improved symptoms, she completed the prednisone about 4-5 days ago and shortness of breath increased and progressively worsened since last 4 days.  This was associated with cough with occasional production of clear mucus and pleuritic chest pain (due to coughing).  Home breathing treatment with nebulizer did not alleviate the patient's symptoms. Patient has a history of sleep apnea and she uses CPAP at night, but she states that the CPAP became nonfunctional about 3 weeks ago and she was in the process of obtaining another one.  She had a virtual consult with her PCP today and patient was referred to the emergency department for further evaluation and management.  She denies fever, chills, headache, nausea, vomiting or diarrhea.  She has never been intubated due to asthma exacerbation.Patient had similar presentation in May of this year (5/18-5/23) and she was referred to a pulmonologist on discharge.  This is the third hospital admission for asthma exacerbation history by patient.    PT Comments    Patient limited for functional mobility as stated below secondary to BLE weakness, fatigue and poor standing balance. Patient with very slow, labored transition to seated EOB today with HOB elevated. She demonstrates good sitting tolerance and balance seated EOB. Patient requires mod  assist to transfer to standing with secondary to weakness. Patient's bilateral LE begin trembling immediately upon transitioning to standing with heavy use of RW. Transfer to standing completed 4 times with 1-2 min standing with heavy use of RW, trembling continued. Attempted to march in place with patient but she is unable to weight bear on unilateral LE for weight shift to advance contralateral LE secondary to weakness. Attempted to take small step forward but unable safely secondary to severe weakness, balance deficits, and LE trembling. Patient requires min assist to for LE back into bed at end of session. Patient will benefit from continued physical therapy in hospital and recommended venue below to increase strength, balance, endurance for safe ADLs and gait.    Follow Up Recommendations  SNF     Equipment Recommendations  3in1 (PT) (4 wheeled Walker with seat and brakes, shower chair)    Recommendations for Other Services       Precautions / Restrictions Precautions Precautions: Fall Restrictions Weight Bearing Restrictions: No    Mobility  Bed Mobility Overal bed mobility: Needs Assistance Bed Mobility: Supine to Sit;Sit to Supine     Supine to sit: Supervision Sit to supine: Min assist   General bed mobility comments: increased time, labored movement  Transfers Overall transfer level: Needs assistance Equipment used: Rolling walker (2 wheeled) Transfers: Sit to/from Stand Sit to Stand: Mod assist         General transfer comment: slow labored movement, has difficulty completing sit to stands due to BLE weakness, legs trembling upon standing. sit to stand and standing with RW x4 for 1-2 mins each time  Ambulation/Gait  General Gait Details: unable to perform with assist and RW, unable to weight bear on unilateral LE for weight shift to advance contralateral LE secondary to weakness   Stairs             Wheelchair Mobility    Modified  Rankin (Stroke Patients Only)       Balance Overall balance assessment: Needs assistance Sitting-balance support: Feet supported;No upper extremity supported Sitting balance-Leahy Scale: Good Sitting balance - Comments: seated at EOB   Standing balance support: During functional activity;Single extremity supported Standing balance-Leahy Scale: Poor Standing balance comment: poor using RW                            Cognition Arousal/Alertness: Awake/alert Behavior During Therapy: WFL for tasks assessed/performed Overall Cognitive Status: Within Functional Limits for tasks assessed                                        Exercises      General Comments        Pertinent Vitals/Pain Pain Assessment: 0-10 Pain Score: 8  Pain Location: posterior LLE, chronic low back pain Pain Descriptors / Indicators: Aching;Discomfort Pain Intervention(s): Monitored during session    Home Living                      Prior Function            PT Goals (current goals can now be found in the care plan section) Acute Rehab PT Goals Patient Stated Goal: return home with family to assist PT Goal Formulation: With patient Time For Goal Achievement: 03/26/20 Potential to Achieve Goals: Fair Progress towards PT goals: Progressing toward goals    Frequency    Min 3X/week      PT Plan Current plan remains appropriate    Co-evaluation              AM-PAC PT "6 Clicks" Mobility   Outcome Measure  Help needed turning from your back to your side while in a flat bed without using bedrails?: None Help needed moving from lying on your back to sitting on the side of a flat bed without using bedrails?: A Little Help needed moving to and from a bed to a chair (including a wheelchair)?: Total Help needed standing up from a chair using your arms (e.g., wheelchair or bedside chair)?: A Lot Help needed to walk in hospital room?: Total Help needed  climbing 3-5 steps with a railing? : Total 6 Click Score: 12    End of Session Equipment Utilized During Treatment: Gait belt Activity Tolerance: Patient limited by fatigue Patient left: with call bell/phone within reach;in bed;with bed alarm set Nurse Communication: Mobility status PT Visit Diagnosis: Unsteadiness on feet (R26.81);Other abnormalities of gait and mobility (R26.89);Muscle weakness (generalized) (M62.81)     Time: 9373-4287 PT Time Calculation (min) (ACUTE ONLY): 24 min  Charges:  $Therapeutic Activity: 23-37 mins                     4:12 PM, 03/14/20 Wyman Songster PT, DPT Physical Therapist at [facility]

## 2020-03-14 NOTE — TOC Transition Note (Signed)
Transition of Care [facility]) - CM/SW Discharge Note   Patient Details  Name: Elizabeth Bautista MRN: [id number] Date of Birth: [birth year]  Transition of Care [facility]) CM/SW Contact:  Elliot Gault, LCSW Phone Number: 03/14/2020, 4:08 PM   Clinical Narrative:     Pt medically stable for dc per MD. PT has seen pt today and recommendation continues to be for SNF rehab. Spoke with pt again and she is again refusing placement. Patient states she will go home and her family will take care of her. This LCSW called pt's son, Katherina Right, to update on pt's inability to ambulate and her refusal for SNF. Per Katherina Right, one of the three adult children are always at home and they will take care of her. He stated that he would come get pt but then agreed that if pt cannot walk, then he will not be able to get her in the house. Katherina Right will come pick up pt's DME and TOC will arrange for EMS transport home.  [facility].   MD and RN aware.   Final next level of care: Home w Home Health Services Barriers to Discharge: Barriers Resolved   Patient Goals and CMS Choice Patient states their goals for this hospitalization and ongoing recovery are:: Return home and complete outpatient therapy.   Choice offered to / list presented to : NA  Discharge Placement                       Discharge Plan and Services In-house Referral: Clinical Social Work Discharge Planning Services: NA Post Acute Care Choice: NA          DME Arranged: 3-N-1, Shower stool, Walker rolling with seat DME Agency: [employer] Date DME Agency Contacted: 03/12/20 Time DME Agency Contacted: 1136 Representative spoke with at DME Agency: [facility] Arranged: PT HH Agency: [facility] Date HH Agency Contacted: 03/14/20 Time HH Agency Contacted: [id number] Representative spoke with at [facility] Agency: Bonita Quin  Social Determinants of Health (SDOH) Interventions     Readmission Risk Interventions Readmission Risk  Prevention Plan 03/12/2020  Transportation Screening Complete  HRI or Home Care Consult Complete  Social Work Consult for Recovery Care Planning/Counseling Complete  Palliative Care Screening Not Applicable  Medication Review Oceanographer) Complete

## 2020-03-14 NOTE — Progress Notes (Signed)
Patient seen and examined.  Reports still feeling weak and deconditioned no other acute complaints.  Hemodynamically stable despite once again recommendations for skilled nursing facility for rehab/conditioning she has continued decline in it.  Patient is competent of making her own decisions on at this time required to go home with family care and home health services.  Please refer to discharge summary written on 03/13/2020 by Dr. Sherryll Burger for further info/details on upcoming follow-up visits and discharge medications/plans.Vassie Loll MD [phone]

## 2020-03-15 ENCOUNTER — Encounter: Payer: Medicaid Other | Admitting: Adult Health

## 2020-03-15 ENCOUNTER — Telehealth: Payer: Self-pay

## 2020-03-15 DIAGNOSIS — Z87898 Personal history of other specified conditions: Secondary | ICD-10-CM

## 2020-03-15 DIAGNOSIS — J45901 Unspecified asthma with (acute) exacerbation: Secondary | ICD-10-CM

## 2020-03-15 NOTE — Telephone Encounter (Signed)
Transition Care Management Unsuccessful Follow-up Telephone Call  Date of discharge and from where:  03/14/2020 from [facility]  Attempts:  1st Attempt  Reason for unsuccessful TCM follow-up call:  Left voice message

## 2020-03-16 NOTE — Telephone Encounter (Signed)
Transition Care Management Unsuccessful Follow-up Telephone Call  Date of discharge and from where:  03/14/2020 from [facility]  Attempts:  2nd Attempt  Reason for unsuccessful TCM follow-up call:  Left voice message

## 2020-03-19 ENCOUNTER — Telehealth (INDEPENDENT_AMBULATORY_CARE_PROVIDER_SITE_OTHER): Payer: Self-pay

## 2020-03-19 ENCOUNTER — Ambulatory Visit: Payer: Medicaid Other | Admitting: Cardiology

## 2020-03-19 NOTE — Telephone Encounter (Signed)
Called Elizabeth Bautista back and gave her the verbal OK per Dr. Karilyn Cota. Elizabeth Bautista verbalized an understanding.

## 2020-03-19 NOTE — Addendum Note (Signed)
Addended by: Radford Pax M on: 03/19/2020 10:51 AM   Modules accepted: Orders

## 2020-03-19 NOTE — Telephone Encounter (Signed)
Yes, you can go ahead and give the verbal order to start care on this patient.  Thanks.

## 2020-03-19 NOTE — Telephone Encounter (Signed)
Transition Care Management Follow-up Telephone Call  Date of discharge and from where: 03/14/2020 from [facility]  How have you been since you were released from the hospital? Patient is having a lot of difficulty and needs help with all of her daily activities and transportation. Patient was not able to get her medication until today and she stated that transportation has been an issue as well. Pt is not able to keep appointments or pick up groceries. Patient also mentioned that she is not able to get out of bed until one of her children get home.   Any questions or concerns? No  Items Reviewed:  Did the pt receive and understand the discharge instructions provided? Yes   Medications obtained and verified? Yes   Other? No   Any new allergies since your discharge? Yes   Dietary orders reviewed? Yes  Do you have support at home? No at times  Home Care and Equipment/Supplies: Were home health services ordered? No Were any new equipment or medical supplies ordered?  Yes 03/12/20 1305  For home use only DME Tub bench  Once       Comments: Tub seat with back.   03/12/20 1304    03/12/20 1304  For home use only DME 3 n 1  Once        03/12/20 1304   03/12/20 1304  For home use only DME 4 wheeled rolling walker with seat  Once       Question:  Patient needs a walker to treat with the following condition  Answer:  Weakness   03/12/20 1304    What is the name of the medical supply agency? Not known Were you able to get the supplies/equipment? Patient stated that she received the equipment. Do you have any questions related to the use of the equipment or supplies? No  Functional Questionnaire: (I = Independent and D = Dependent) ADLs: D  Bathing/Dressing- D  Meal Prep- D  Eating- D  Maintaining continence- D  Transferring/Ambulation- D  Managing Meds- D  Follow up appointments reviewed:   [facility]t confirmed? Yes  Scheduled to see  03/26/2020  9:15 AM Zaunegger, Reola Mosher, PT [facility]   04/02/2020 10:00 AM (Arrive by 9:45 AM) Nyoka Cowden, MD [facility]   04/11/2020 8:30 AM Alfonse Spruce, MD [facility]   04/24/2020 9:45 AM (Arrive by 9:30 AM) Wilson Singer, MD [facility]   06/12/2020 8:30 AM (Arrive by 8:00 AM) Butch Penny, NP [facility]     Are transportation arrangements needed? Yes   If their condition worsens, is the pt aware to call PCP or go to the Emergency Dept.? Yes  Was the patient provided with contact information for the PCP's office or ED? Yes  Was to pt encouraged to call back with questions or concerns? Yes

## 2020-03-19 NOTE — Telephone Encounter (Signed)
Received a voice message from Laney Potash, patient care manager, with [facility] [phone]) and she is requesting orders to start care on Wednesday for patient.  Please advise if OK to start care.

## 2020-03-19 NOTE — Telephone Encounter (Signed)
Transition Care Management Unsuccessful Follow-up Telephone Call  Date of discharge and from where:  03/14/2020 from [facility]  Attempts:  3rd Attempt  Reason for unsuccessful TCM follow-up call:  Left voice message

## 2020-03-21 ENCOUNTER — Other Ambulatory Visit: Payer: Self-pay

## 2020-03-21 ENCOUNTER — Other Ambulatory Visit: Payer: Self-pay | Admitting: Obstetrics and Gynecology

## 2020-03-21 NOTE — Patient Instructions (Signed)
Hi Elizabeth Bautista you for speaking with me today.    Elizabeth Bautista was given information about Medicaid Managed Care team care coordination services and consented to engagement with the [facility] Managed Care team.   Goals Addressed              This Visit's Progress   .  "I'm struggling to walk" (pt-stated)        CARE PLAN ENTRY Medicaid Managed Care (see longitudinal plan of care for additional care plan information)  Current Barriers:  . Patient has been unable to start physical therapy yet.  Nurse Case Manager Clinical Goal(s):  Elizabeth Bautista Over the next 30 days, patient will work with HHPTto address needs related to walking.   Interventions:  . Inter-disciplinary care team collaboration (see longitudinal plan of care) . Advised patient to follow recommended plan for HHPT. Elizabeth Bautista Discussed plans with patient for ongoing care management follow up and provided patient with direct contact information for care management team  Plan:  . Patient will participate in HHPT . RNCM will follow up with patient within the next 7 days.   Initial goal documentation     .  "MY blood pressure mediciane is not working" (pt-stated)        CARE PLAN ENTRY Medicaid Managed Care (see longitudinal plan of care for additional care plan information)  Current Barriers:  . Patient stated her blood pressure medicine does not seem to be working right-she feels dizzy and has a headache.    Nurse Case Manager Clinical Goal(s)  Patient will communicate with PCP regarding blood pressure concerns.  Interventions:  . Inter-disciplinary care team collaboration (see longitudinal plan of care) . Advised patient to contact her PCP. Elizabeth Bautista Discussed plans with patient for ongoing care management follow up and provided patient with direct contact information for care management team  Plan:  . Patient will contact her PCP today. Elizabeth Bautista RNCM will follow up with patient within the next 7 days.   Initial goal documentation          The Managed Medicaid care management team will reach out to the patient again over the next 7 days.   Kathi Der RN, BSN [place]  [employer], production - Managed Medicaid High Risk [phone]

## 2020-03-21 NOTE — Telephone Encounter (Signed)
Return call as she asked, but only getting voicemail. Passing message and see what needs to be done . Please advise.

## 2020-03-21 NOTE — Patient Outreach (Signed)
Care Coordination - Case Manager  03/21/2020  [name] 01-14-1976 [id number]  Subjective:  Elizabeth Bautista is an 44 y.o. year old female who is a primary patient of Doree Albee, MD.  [name] was given information about Medicaid Managed Care team care coordination services today. [name] agreed to services and verbal consent obtained  Review of patient status, laboratory and other test data was performed as part of evaluation for provision of services.  SDOH: SDOH Screenings   Alcohol Screen:   . Last Alcohol Screening Score (AUDIT): Not on file  Depression (PHQ2-9): Medium Risk  . PHQ-2 Score: 26  Financial Resource Strain:   . Difficulty of Paying Living Expenses: Not on file  Food Insecurity:   . Worried About Charity fundraiser in the Last Year: Not on file  . Ran Out of Food in the Last Year: Not on file  Housing:   . Last Housing Risk Score: Not on file  Physical Activity:   . Days of Exercise per Week: Not on file  . Minutes of Exercise per Session: Not on file  Social Connections:   . Frequency of Communication with Friends and Family: Not on file  . Frequency of Social Gatherings with Friends and Family: Not on file  . Attends Religious Services: Not on file  . Active Member of Clubs or Organizations: Not on file  . Attends Archivist Meetings: Not on file  . Marital Status: Not on file  Stress:   . Feeling of Stress : Not on file  Tobacco Use: Medium Risk  . Smoking Tobacco Use: Former Smoker  . Smokeless Tobacco Use: Never Used  Transportation Needs:   . Film/video editor (Medical): Not on file  . Lack of Transportation (Non-Medical): Not on file     Objective:    Allergies  Allergen Reactions  . Phenytoin Anaphylaxis    Other reaction(s): swelling and heart rate decreased  . Phenylbutazones     Medications:    Medications Reviewed Today    Reviewed by Gayla Medicus, RN (Registered Nurse) on 03/21/20 at 1138  Med List  Status: <None>  Medication Order Taking? Sig Documenting Provider Last Dose Status Informant  acetaminophen (TYLENOL) 500 MG chewable tablet [id number] No Chew 1 tablet (500 mg total) by mouth every 8 (eight) hours as needed for pain.  Patient not taking: Reported on 03/21/2020   Ailene Ards, NP Not Taking Active Self  albuterol [facility] HFA) [phone] Base) MCG/ACT inhaler [id number] Yes 2 puffs every 4 hours as needed only  if your can't catch your breath Tanda Rockers, MD Taking Active Self  blood glucose meter kit and supplies [id number] Yes Dispense based on patient and insurance preference. Use up to four times daily as directed. (FOR ICD-10 E10.9, E11.9). Ailene Ards, NP Taking Active Self  Blood Pressure Monitoring [facility]) [place] [id number] Yes 1 each by Does not apply route daily. Ailene Ards, NP Taking Active Self  busPIRone (BUSPAR) 7.5 MG tablet [id number] Yes Take 7.5 mg by mouth 2 (two) times daily. [provider] Taking Active Self  Cholecalciferol 1.25 MG (50000 UT) TABS [id number] Yes Take 1 tablet by mouth daily. Ailene Ards, NP Taking Active Self  cyclobenzaprine (FLEXERIL) 10 MG tablet [id number] Yes Take 10 mg by mouth 3 (three) times daily as needed for muscle spasms.  [provider] Taking Active Self  escitalopram (LEXAPRO) 10 MG tablet [id number] Yes Take 1 tablet (10  mg total) by mouth daily. Manuella Ghazi, Pratik D, DO Taking Active   escitalopram (LEXAPRO) 20 MG tablet [id number] Yes Take 1 tablet by mouth every evening. [provider] Taking Active Self  famotidine (PEPCID) 20 MG tablet [id number] Yes Take 20 mg by mouth daily. [provider] Taking Active Self  ferrous sulfate 325 (65 FE) MG tablet [id number] Yes Take 325 mg by mouth 2 (two) times daily. [provider] Taking Active Self  fluticasone (CUTIVATE) 0.005 % ointment [id number] Yes Apply 1 application topically daily. [provider] Taking Active Self    gabapentin (NEURONTIN) 100 MG capsule [id number] Yes Take 1 capsule (100 mg total) by mouth 3 (three) times daily. Melvenia Beam, MD Taking Active Self  hydrochlorothiazide (HYDRODIURIL) 25 MG tablet [id number] Yes Take 1 tablet (25 mg total) by mouth daily. For high blood pressure. Ailene Ards, NP Taking Active Self  hydroxychloroquine (PLAQUENIL) 200 MG tablet [id number] Yes Take 1 tablet (200 mg total) by mouth 2 (two) times daily. Ailene Ards, NP Taking Active Self  hydrOXYzine (ATARAX/VISTARIL) 25 MG tablet [id number] Yes Take 25 mg by mouth 2 (two) times daily. [provider] Taking Active Self  ibuprofen (ADVIL) 800 MG tablet [id number] Yes Take 1 tablet (800 mg total) by mouth every 8 (eight) hours as needed. Ailene Ards, NP Taking Active Self  KLOR-CON M20 20 MEQ tablet [id number] Yes TAKE 1 TABLET BY MOUTH DAILY ONLY WHEN YOU TAKE FUROSEMIDE.  Patient taking differently: 20 mEq daily. Only when you take Furosemide.   Doree Albee, MD Taking Active Self  levETIRAcetam (KEPPRA) 500 MG tablet [id number] Yes Take 1 tablet (500 mg total) by mouth 2 (two) times daily. Manuella Ghazi, Pratik D, DO Taking Active   mirtazapine (REMERON) 15 MG tablet [id number] Yes Take 15 mg by mouth at bedtime as needed. [provider] Taking Active Self  montelukast (SINGULAIR) 10 MG tablet [id number] Yes Take 1 tablet (10 mg total) by mouth at bedtime. Manuella Ghazi, Pratik D, DO Taking Active   pantoprazole (PROTONIX) 40 MG tablet [id number] Yes Take 1 tablet (40 mg total) by mouth daily. Take 30-60 min before first meal of the day Tanda Rockers, MD Taking Active Self  prazosin (MINIPRESS) 1 MG capsule [id number] Yes Take 1 mg by mouth at bedtime. [provider] Taking Active Self  QUEtiapine (SEROQUEL) 25 MG tablet [id number] Yes Take 25 mg by mouth 2 (two) times daily.  [provider] Taking Active Self  SYMBICORT 160-4.5 MCG/ACT inhaler [id number] Yes Inhale 1 puff into the lungs in  the morning and at bedtime. [provider] Taking Active Self  Tiotropium Bromide-Olodaterol (STIOLTO RESPIMAT) 2.5-2.5 MCG/ACT AERS [id number] Yes Inhale 2 puffs into the lungs daily. Tanda Rockers, MD Taking Active Self          Assessment:   Goals Addressed              This Visit's Progress   .  "I'm struggling to walk" (pt-stated)        [place] Medicaid Managed Care (see longitudinal plan of care for additional care plan information)  Current Barriers:  . Patient has been unable to start physical therapy yet.  Nurse Case Manager Clinical Goal(s):  Marland Kitchen Over the next 30 days, patient will work with [place] address needs related to walking.   Interventions:  . Inter-disciplinary care team collaboration (see longitudinal plan of care) . Advised patient to follow recommended plan for HHPT. Marland Kitchen Discussed  plans with patient for ongoing care management follow up and provided patient with direct contact information for care management team  Plan:  . Patient will participate in [place] . RNCM will follow up with patient within the next 7 days.   Initial goal documentation     .  "MY blood pressure mediciane is not working" (pt-stated)        [place] (see longitudinal plan of care for additional care plan information)  Current Barriers:  . Patient stated her blood pressure medicine does not seem to be working right-she feels dizzy and has a headache.    Nurse Case Manager Clinical Goal(s)  Patient will communicate with PCP regarding blood pressure concerns.  Interventions:  . Inter-disciplinary care team collaboration (see longitudinal plan of care) . Advised patient to contact her PCP. Marland Kitchen Discussed plans with patient for ongoing care management follow up and provided patient with direct contact information for care management team  Plan:  . Patient will contact her PCP today. Marland Kitchen RNCM will follow up with patient within the  next 7 days.   Initial goal documentation        Plan: RNCM will follow up with patient within the next 7 days.

## 2020-03-21 NOTE — Patient Outreach (Signed)
Care Coordination  03/21/2020  Elizabeth Bautista [birth year] [id number]  An unsuccessful telephone outreach was attempted today. The patient was referred to the case management team for assistance with care management and care coordination.   Follow Up Plan: Social Worker will follow up with patient in 7 days.Gus Puma, BSW, MHA [facility]   [place]  High Risk Managed Medicaid Team

## 2020-03-21 NOTE — Patient Outreach (Signed)
Care Coordination  03/21/2020  Arelyn Gauer 08-03-1975 [id number]  An unsuccessful telephone outreach was attempted today. The patient was referred to the case management team for assistance with care management and care coordination.   Follow Up Plan: The Managed Medicaid care management team will reach out to the patient again over the next 7 days.   Kathi Der RN, BSN [place]  [facility] [facility], [employer] - Managed Medicaid High Risk [phone]

## 2020-03-21 NOTE — Patient Instructions (Signed)
  Hi Ms. Brose I missed you today.  I will try to call again on another day.   As a part of your Medicaid benefit, you are eligible for care management and care coordination services at no cost or copay. I was unable to reach you by phone today but would be happy to help you with your health related needs. Please feel free to call me at [phone]  A member of the Managed Medicaid care management team will reach out to you again over the next 7 days.   Kathi Der RN, BSN [place]  [employer], production - Managed Medicaid High Risk [phone]

## 2020-03-22 ENCOUNTER — Other Ambulatory Visit (INDEPENDENT_AMBULATORY_CARE_PROVIDER_SITE_OTHER): Payer: Self-pay | Admitting: Internal Medicine

## 2020-03-22 MED ORDER — AMLODIPINE BESYLATE 5 MG PO TABS: 5.0000 mg | ORAL_TABLET | Freq: Every day | ORAL | 3 refills | Status: DC

## 2020-03-22 NOTE — Patient Instructions (Signed)
Visit Information  Elizabeth Bautista was given information about Medicaid Managed Care team care coordination services as a part of their Healthy [facility] Medicaid benefit. [name] verbally consented to engagement with the [employer] Managed Care team.   For questions related to your Healthy [facility] health plan, please call: [phone] or visit the homepage here: [email or web address]  If you would like to schedule transportation through your Healthy [facility] plan, please call the following number at least 2 days in advance of your appointment: [phone]   Social Worker will follow up in 7 days.  Gus Puma, BSW, [place] [facility]  [place]  High Risk Managed Medicaid Team

## 2020-03-22 NOTE — Patient Outreach (Signed)
Care Coordination  03/22/2020  Elizabeth Bautista 04/24/1976 [id number]  An unsuccessful telephone outreach was attempted today. The patient was referred to the case management team for assistance with care management and care coordination.   Follow Up Plan: Social Worker will follow up in 7 days.   Gus Puma, BSW, [place] [facility]  [place]  High Risk Managed Medicaid Team

## 2020-03-22 NOTE — Telephone Encounter (Signed)
Please advise 

## 2020-03-26 ENCOUNTER — Ambulatory Visit (HOSPITAL_COMMUNITY): Payer: Medicaid Other | Attending: Internal Medicine | Admitting: Physical Therapy

## 2020-03-27 ENCOUNTER — Other Ambulatory Visit: Payer: Self-pay

## 2020-03-27 NOTE — Patient Outreach (Signed)
Care Coordination  03/27/2020  Elizabeth Bautista 03/20/1976 [id number]  An unsuccessful telephone outreach was attempted today. The patient was referred to the case management team for assistance with care management and care coordination.   Follow Up Plan: The Managed Medicaid care management team will reach out to the patient again over the next 7 days.   Kathi Der RN, BSN [place]  [employer], production - Managed Medicaid High Risk [phone]

## 2020-03-27 NOTE — Patient Instructions (Signed)
Hi Elizabeth Bautista,  - as a part of your Medicaid benefit, you are eligible for care management and care coordination services at no cost or copay. I was unable to reach you by phone today but would be happy to help you with your health related needs. Please feel free to call me at [phone].  A member of the Managed Medicaid care management team will reach out to you again over the next 7 days.   Kathi Der RN, BSN [place]  [employer], production - Managed Medicaid High Risk [phone]

## 2020-03-28 ENCOUNTER — Other Ambulatory Visit (INDEPENDENT_AMBULATORY_CARE_PROVIDER_SITE_OTHER): Payer: Self-pay | Admitting: Internal Medicine

## 2020-03-30 ENCOUNTER — Other Ambulatory Visit: Payer: Self-pay

## 2020-03-30 NOTE — Patient Instructions (Signed)
Visit Information  Ms. Shimeka Ertl  - as a part of your Medicaid benefit, you are eligible for care management and care coordination services at no cost or copay. I was unable to reach you by phone today but would be happy to help you with your health related needs. Please feel free to call me @ [phone].   A member of the Managed Medicaid care management team will reach out to you again over the next 7 days.   Gus Puma, BSW, [place] [facility]    High Risk Managed Medicaid Team

## 2020-03-30 NOTE — Patient Outreach (Signed)
Care Coordination  03/30/2020  Elizabeth Bautista [birth year] [id number]  A second unsuccessful telephone outreach was attempted today. The patient was referred to the case management team for assistance with care management and care coordination.   Follow Up Plan: The Managed Medicaid care management team will reach out to the patient again over the next 7 days.   Gus Puma, BSW, [place] [facility]  [place]  High Risk Managed Medicaid Team

## 2020-04-02 ENCOUNTER — Ambulatory Visit (INDEPENDENT_AMBULATORY_CARE_PROVIDER_SITE_OTHER): Payer: Medicaid Other | Admitting: Internal Medicine

## 2020-04-02 ENCOUNTER — Other Ambulatory Visit: Payer: Self-pay

## 2020-04-02 ENCOUNTER — Encounter: Payer: Self-pay | Admitting: Internal Medicine

## 2020-04-02 VITALS — BP 130/82 | HR 82 | Temp 98.3°F | Ht 65.0 in | Wt 280.0 lb

## 2020-04-02 DIAGNOSIS — G4730 Sleep apnea, unspecified: Secondary | ICD-10-CM

## 2020-04-02 DIAGNOSIS — J4521 Mild intermittent asthma with (acute) exacerbation: Secondary | ICD-10-CM | POA: Diagnosis not present

## 2020-04-02 DIAGNOSIS — R058 Other specified cough: Secondary | ICD-10-CM | POA: Diagnosis not present

## 2020-04-02 MED ORDER — STIOLTO RESPIMAT 2.5-2.5 MCG/ACT IN AERS: 2.0000 | INHALATION_SPRAY | Freq: Every day | RESPIRATORY_TRACT | 0 refills | Status: DC

## 2020-04-02 MED ORDER — PREDNISONE 10 MG PO TABS: ORAL_TABLET | ORAL | 0 refills | Status: DC

## 2020-04-02 NOTE — Assessment & Plan Note (Addendum)
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  FEV1 2.03 (82%)  Ratio 0.73  (multiple studies prior to this study all nl with nl exp portion of f/v loop and erratic insp loops s true plateau - 02/09/2020  Trial of stiolto and pred - 02/21/2020  After extensive coaching inhaler device,  effectiveness =    90% with [facility] and no longer makes her cough on inspiration so rec continue stiolto 2 pffs and taper off pred  - 02/21/2020 referred to allergy  and stopped singulair at pt request  - flared and back in ER 10/6 after stopping prednisone and not restarting as per written action plan - flared again 03/30/20 this time p stopping stiolto and not calling for refill and started back on prednisone 04/02/2020   Unable to confirm what exactly she's taking at this point but the reason for the stiolto, which I gave her another sample of today, is it's lack of triggering UACS vs other options   In meantime advised: I spent extra time with pt today reviewing appropriate use of albuterol for prn use on exertion with the following points: 1) saba is for relief of sob that does not improve by walking a slower pace or resting but rather if the pt does not improve after trying this first. 2) If the pt is convinced, as many are, that saba helps recover from activity faster then it's easy to tell if this is the case by re-challenging : ie stop, take the inhaler, then p 5 minutes try the exact same activity (intensity of workload) that just caused the symptoms and see if they are substantially diminished or not after saba 3) if there is an activity that reproducibly causes the symptoms, try the saba 15 min before the activity on alternate days   If in fact the saba really does help, then fine to continue to use it prn but advised may need to look closer at the maintenance regimen being used to achieve better control of airways disease with exertion.    To see Dellis Anes 04/11/20     Pt informed of the seriousness of COVID 19 infection as a direct risk to lung health  and safey and to close contacts and should continue to wear a facemask in public and minimize exposure to public locations but especially avoid any area or activity where non-close contacts are not observing distancing or wearing an appropriate face mask.  I strongly recommended she take either of the vaccines available through local drugstores based on updated information on millions of Americans treated with the Moderna and [employer] products  which have proven both safe and  effective even against the new delta variant.

## 2020-04-02 NOTE — Progress Notes (Addendum)
Elizabeth Bautista, female    DOB: January 02, 1976,    MRN: [id number]   Brief patient profile:  12 yobf  Quit smoking 06/2014  with allergies as child and new onset "asthma" during difficult delivery Feb 13, 1999 which resulted in death of her baby and "never right since despite quit smoking in 2016 much worse since summer 2020 req freq prednisone rec then admit   Admit date: 10/18/2019 Discharge date: 10/23/2019  Brief/Interim Summary: 44 y.o.femalewith medical history significant forasthma, anemia, seizures, sleep apneapresents with 2 weeks of sob, cough and wheeze.Pt has been using Albuterol nebulizer at home every 4 hours without relief. Pt went to Urgent Care5/18/21and they sent pt to ED. Patient lives in [place], and has been in [place] visiting for the past 2 months. Is undecided if she is going to move to [place]. She reports admissions in the past for asthma exacerbations in [place]. She has never required intubation  ED Course:O2 sats 98% on room air temperature 98.2. WBC 5.8. Portable chest x-ray without acute abnormality. COVID-19 test negative. DuoNebs, hour-long albuterol nebulizer, Ativan 0.5 mg, Solu-Medrol 125 mg, magnesium, given, still with significant wheezing and increased work of breathing hence hospitalist was called for admission for asthma exacerbation.   Discharge Diagnoses:  Asthma exacerbation -Continue experiencing shortness of breath with exertion, wheezing and tachypnea. -continue CPAP hs use -Continue duonebs andBrovana -ContinueIV solumedrol>>>prednisone taper -personally reviewed CXR--no edema or consolidation -appreciate pulmonary -continue singulair -CT maxillofacial--Normal non-contrast Face CT -10/22/19--more chest tightness and sob--> -repeat CXR--no consolidation or edema -personally reviewed EKG--sinus, nonspecific TWI -D-dimer--neg -check troponins 2-->3 -given mag -10/23/19--breathing much improved, near baseline -home with  Symbicort (insurance will not pay for Dulera)  morbidobesityandsleep apnea -continue BIPAP QHS -Body mass index is 49.56 kg/m. -lifestyle modification  anxiety/Depression -No suicidal ideation or hallucination -Continue Seroquel, BuSpar, Lexapro and as needed hydroxyzine.  Rheumatoid arthritis (Elizabeth Bautista) -continue plaquenil  GERD/GI prophylaxis -continue Pepcid.  hyperglycemia/Diabetes Mellitus type 2 -no prior hx of diabetes -positive family hx and obesity -steroids most likely contributing -A1C--6.6 -increasedlevemir to 15units -escalate to resistant SSI -d/c home with metformin -f/u PCP to adjust regimen      History of Present Illness  02/09/2020  Pulmonary/ 1st office eval/ Elizabeth Bautista / [facility] / post hosp f/u  Chief Complaint  Patient presents with  . Consult    shortness of breath, productive cough with clear phlegm  80% better prednisone taper x one week  > rx by Elizabeth Bautista multiple courses of pred but none x one month and worse.  At baseline same appt no new pets/ dulera x 2/ spiriva  Dyspnea:  Room to room  Cough: dry cough Sleep: worse with cpap with 4 pillows and bed is flat  SABA use:  Last used 3 h prior  rec Plan A = Automatic = Always=    Stiolto 2 puffs each am  - take two good deep slow drags  Prednisone 10 mg take 2 until 100% better then 1 daily until seen Protonix (pantoprazole 40 mg) Take 30-60 min before first meal of the day and take pepcid (famotidine) after supper and  Diet below Plan B = Backup (to supplement plan A, not to replace it) Only use your albuterol (Proair) inhaler as a rescue medication  Late add Plan C - albuterol neb if tries plan B and it doesn't work   02/21/2020  f/u ov/ office/Elizabeth Bautista re: dtc asthma/ likely vcd componentBrought some meds, not all  Chief Complaint  Patient presents  with  . Follow-up    better p 2 weeks of pred but still coughing dry  thinks stiolto working better than anything prior while on  pred 20 mg daily but still cough day > noct, dry, and over using saba  rec  I very strongly recommend you get the moderna or pfizer vaccine    Only use your albuterol as a rescue medication  Try albuterol 15 min before an activity that you know would make you short of breath and see if it makes any difference and if makes none then don't take it after activity unless you can't catch your breath. We will be referring you to Elizabeth Bautista  Prednisone 10 mg daily x 5 daily then 5 mg dialy x 5 days and stop, if worse start over Please schedule a follow up office visit in 4 weeks, sooner if needed  with all medications /inhalers/ solutions in hand so we can verify exactly what you are taking. This includes all medications from all doctors and over the counters    04/02/2020  f/u ov/Elizabeth Bautista re: asthma / osa  - did not bring meds  off prednisone for 2 weeks    Chief Complaint  Patient presents with  . Follow-up    shortness of breath with exertion   Dyspnea:  Much improved until out of stiolto 10/27 and even while on stiolto  Still needed at least bid saba but never pre-challenged prior to exertion ? Why  Not  A "because the time I needed albterol was when I ran to answer the doorbell"   Cough: recurred x 03/30/20 and started back on prednisone@ 10 mg 04/02/2020  Sleeping: could not sleep s choking s cpap and the mask is broken  SABA use:  02: none    No obvious day to day or daytime variability or assoc excess/ purulent sputum or mucus plugs or hemoptysis or cp or chest tightness, subjective wheeze or overt sinus or hb symptoms.     Also denies any obvious fluctuation of symptoms with weather or environmental changes or other aggravating or alleviating factors except as outlined above   No unusual exposure hx or h/o childhood pna/ asthma or knowledge of premature birth.  Current Allergies, Complete Past Medical History, Past Surgical History, Family History, and Social History were reviewed in  Elizabeth Bautista record.  ROS  The following are not active complaints unless bolded Hoarseness, sore throat, dysphagia, dental problems, itching, sneezing,  nasal congestion or discharge of excess mucus or purulent secretions, ear ache,   fever, chills, sweats, unintended wt loss or wt gain, classically pleuritic or exertional cp,  orthopnea pnd or arm/hand swelling  or leg swelling, presyncope, palpitations, abdominal pain, anorexia, nausea, vomiting, diarrhea  or change in bowel habits or change in bladder habits, change in stools or change in urine, dysuria, hematuria,  rash, arthralgias, visual complaints, headache, numbness, weakness or ataxia or problems with walking or coordination,  change in mood/ anxious or  memory.        Current Meds -  - NOTE:   Unable to verify as accurately reflecting what pt takes     Medication Sig  . acetaminophen (TYLENOL) 500 MG chewable tablet Chew 1 tablet (500 mg total) by mouth every 8 (eight) hours as needed for pain.  Marland Kitchen albuterol (PROAIR HFA) 108 (90 Base) MCG/ACT inhaler 2 puffs every 4 hours as needed only  if your can't catch your breath  . amLODipine (NORVASC) 5 MG tablet  Take 1 tablet (5 mg total) by mouth daily.  . blood glucose meter kit and supplies Dispense based on patient and insurance preference. Use up to four times daily as directed. (FOR ICD-10 E10.9, E11.9).  Marland Kitchen Blood Pressure Monitoring (SPHYGMOMANOMETER) MISC 1 each by Does not apply route daily.  . busPIRone (BUSPAR) 7.5 MG tablet Take 7.5 mg by mouth 2 (two) times daily.  . Cholecalciferol 1.25 MG (50000 UT) TABS Take 1 tablet by mouth daily.  . cyclobenzaprine (FLEXERIL) 10 MG tablet Take 10 mg by mouth 3 (three) times daily as needed for muscle spasms.   Marland Kitchen escitalopram (LEXAPRO) 10 MG tablet Take 1 tablet (10 mg total) by mouth daily.  Marland Kitchen escitalopram (LEXAPRO) 20 MG tablet Take 1 tablet by mouth every evening.  . famotidine (PEPCID) 20 MG tablet Take 20 mg by mouth  daily.  . ferrous sulfate 325 (65 FE) MG tablet Take 325 mg by mouth 2 (two) times daily.  . fluticasone (CUTIVATE) 0.005 % ointment Apply 1 application topically daily.  . furosemide (LASIX) 20 MG tablet Take 20 mg by mouth daily.  Marland Kitchen gabapentin (NEURONTIN) 100 MG capsule Take 1 capsule (100 mg total) by mouth 3 (three) times daily.  . hydrochlorothiazide (HYDRODIURIL) 25 MG tablet Take 1 tablet (25 mg total) by mouth daily. For high blood pressure.  . hydroxychloroquine (PLAQUENIL) 200 MG tablet Take 1 tablet (200 mg total) by mouth 2 (two) times daily.  . hydrOXYzine (ATARAX/VISTARIL) 25 MG tablet Take 25 mg by mouth 2 (two) times daily.  Marland Kitchen ibuprofen (ADVIL) 800 MG tablet Take 1 tablet (800 mg total) by mouth every 8 (eight) hours as needed.  Marland Kitchen KLOR-CON M20 20 MEQ tablet TAKE 1 TABLET BY MOUTH DAILY ONLY WHEN YOU TAKE FUROSEMIDE. (Patient taking differently: 20 mEq daily. Only when you take Furosemide.)  . levETIRAcetam (KEPPRA) 500 MG tablet Take 1 tablet (500 mg total) by mouth 2 (two) times daily.  . mirtazapine (REMERON) 15 MG tablet Take 15 mg by mouth at bedtime as needed.  . montelukast (SINGULAIR) 10 MG tablet Take 1 tablet (10 mg total) by mouth at bedtime.  . pantoprazole (PROTONIX) 40 MG tablet Take 1 tablet (40 mg total) by mouth daily. Take 30-60 min before first meal of the day  . prazosin (MINIPRESS) 1 MG capsule Take 1 mg by mouth at bedtime.  Marland Kitchen QUEtiapine (SEROQUEL) 25 MG tablet Take 25 mg by mouth 2 (two) times daily.   . SYMBICORT 160-4.5 MCG/ACT inhaler Inhale 1 puff into the lungs in the morning and at bedtime.  . Tiotropium Bromide-Olodaterol (STIOLTO RESPIMAT) 2.5-2.5 MCG/ACT AERS Inhale 2 puffs into the lungs daily.                                Past Medical History:  Diagnosis Date  . Anemia   . Asthma   . Edema   . HTN (hypertension)   . Seizures ([place])    last seizure 2019  . Sleep apnea   . Sleep apnea         Objective:    04/02/2020        280   02/21/20 284 lb (128.8 kg)  02/16/20 282 lb (127.9 kg)  02/09/20 287 lb 6.4 oz (130.4 kg)    Vital signs reviewed  04/02/2020  - Note at rest 02 sats  98% on RA    Obese bf flat affect/ freq throat clearing  HEENT : pt wearing mask not removed for exam due to covid -19 concerns.    NECK :  without JVD/Nodes/TM/ nl carotid upstrokes bilaterally   LUNGS: no acc muscle use,  Nl contour chest with mostly transmitted pseudowheeze   without cough on insp or exp maneuvers    CV:  RRR  no s3 or murmur or increase in P2, and no edema   ABD:  soft and nontender with nl inspiratory excursion in the supine position. No bruits or organomegaly appreciated, bowel sounds nl  MS:  Nl gait/ ext warm without deformities, calf tenderness, cyanosis or clubbing No obvious joint restrictions   SKIN: warm and dry without lesions    NEURO:  alert, approp, nl sensorium with  no motor or cerebellar deficits apparent.         Assessment

## 2020-04-02 NOTE — Patient Instructions (Addendum)
Only use your albuterol as a rescue medication to be used if you can't catch your breath by resting or doing a relaxed purse lip breathing pattern.  - The less you use it, the better it will work when you need it. - Ok to use up to 2 puffs  every 4 hours if you must but call for immediate appointment if use goes up over your usual need - Don't leave home without it !!  (think of it like the spare tire for your car)   Try albuterol 15 min before (on alternate days)  an activity that you know would make you short of breath and see if it makes any difference and if makes none then don't take it after activity unless you can't catch your breath.  Prednisone 10 mg daily x 5 day  then 5 mg daily x 5 days and stop, if worse start over  Resume stiolto for 2 puffs each am   We will see if we can help you with a mask and scheduling split night study with mask of choice and f/u with Dr Craige Cotta   I will see you here as needed - please bring all active medications/ solutions/ inhalers

## 2020-04-03 ENCOUNTER — Other Ambulatory Visit: Payer: Self-pay

## 2020-04-03 ENCOUNTER — Ambulatory Visit: Payer: Medicaid Other | Admitting: Cardiology

## 2020-04-03 NOTE — Assessment & Plan Note (Signed)
Onset 2000 p death of baby during delivery with prominent vcd features   Flared off prednisone ? Really taking gabapentin as directed with ? Element of allergic rhinitis > allergy eval pending on 11/20 and in meantime rec pred 10 mg  Taper off again as tol

## 2020-04-03 NOTE — Assessment & Plan Note (Signed)
Attempting to set up spit night study and early f/u with Dr Craige Cotta           Each maintenance medication was reviewed in detail including emphasizing most importantly the difference between maintenance and prns and under what circumstances the prns are to be triggered using an action plan format where appropriate.  Total time for H and P, chart review, counseling, teaching device and generating customized AVS unique to this office visit / charting = 32 m

## 2020-04-03 NOTE — Patient Outreach (Signed)
Care Coordination  04/03/2020  Elizabeth Bautista 05/14/1976 [id number]   A second unsuccessful telephone outreach was attempted today. The patient was referred to the case management team for assistance with care management and care coordination.   Follow Up Plan: The Managed Medicaid care management team will reach out to the patient again over the next 7 days.   Kathi Der RN, BSN [place]  [facility] [facility] - Managed Medicaid High Risk [phone]

## 2020-04-03 NOTE — Patient Instructions (Signed)
Hi Ms. Hegner, sorry we missed you today- as a part of your Medicaid benefit, you are eligible for care management and care coordination services at no cost or copay. I was unable to reach you by phone today but would be happy to help you with your health related needs. Please feel free to call me at [phone].  A member of the Managed Medicaid care management team will reach out to you again over the next 7 days.   Kathi Der RN, BSN [place]  [facility] - Managed Medicaid High Risk [phone]

## 2020-04-04 ENCOUNTER — Ambulatory Visit (INDEPENDENT_AMBULATORY_CARE_PROVIDER_SITE_OTHER): Payer: Medicaid Other | Admitting: Pulmonary Disease

## 2020-04-04 ENCOUNTER — Encounter: Payer: Self-pay | Admitting: Pulmonary Disease

## 2020-04-04 ENCOUNTER — Encounter: Payer: Self-pay | Admitting: Internal Medicine

## 2020-04-04 ENCOUNTER — Other Ambulatory Visit: Payer: Self-pay

## 2020-04-04 VITALS — BP 128/80 | HR 71 | Ht 65.0 in | Wt 293.2 lb

## 2020-04-04 DIAGNOSIS — E669 Obesity, unspecified: Secondary | ICD-10-CM

## 2020-04-04 DIAGNOSIS — G4730 Sleep apnea, unspecified: Secondary | ICD-10-CM | POA: Diagnosis not present

## 2020-04-04 DIAGNOSIS — K219 Gastro-esophageal reflux disease without esophagitis: Secondary | ICD-10-CM

## 2020-04-04 DIAGNOSIS — G4733 Obstructive sleep apnea (adult) (pediatric): Secondary | ICD-10-CM

## 2020-04-04 DIAGNOSIS — R058 Other specified cough: Secondary | ICD-10-CM | POA: Diagnosis not present

## 2020-04-04 DIAGNOSIS — J4530 Mild persistent asthma, uncomplicated: Secondary | ICD-10-CM

## 2020-04-04 MED ORDER — FLUTICASONE PROPIONATE 50 MCG/ACT NA SUSP: 1.0000 | Freq: Every day | NASAL | 5 refills | Status: DC

## 2020-04-04 NOTE — Patient Instructions (Signed)
Will have your sign release form to get medical records from GI doctors and sleep studies in [place]  Will make sure your in lab sleep study is scheduled  Will try to get you set up with a medical supply company in [place] to get new CPAP mask  Try using saline nasal spray (nasal irrigation) nightly, and then use flonase 1 spray in each nostril nightly  Follow up in 2 months
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
## 2020-04-05 ENCOUNTER — Telehealth: Payer: Self-pay | Admitting: Pulmonary Disease

## 2020-04-05 NOTE — Telephone Encounter (Signed)
Per [facility] they need the sleep study for this patient to complete the DME order placed by Dr. Craige Cotta on 04/04/20.  I am not able to locate the sleep study.

## 2020-04-06 NOTE — Telephone Encounter (Signed)
I am in the process of getting patient's records from her old pulmonologist in [place], [place] to be faxed to our office. Patient signed release of records at last visit and this has been faxed.

## 2020-04-06 NOTE — Telephone Encounter (Signed)
Re'd hst from [place] for pt. Fx'd to [employer].

## 2020-04-10 ENCOUNTER — Other Ambulatory Visit: Payer: Self-pay

## 2020-04-10 DIAGNOSIS — E876 Hypokalemia: Secondary | ICD-10-CM

## 2020-04-10 DIAGNOSIS — I10 Essential (primary) hypertension: Secondary | ICD-10-CM | POA: Diagnosis not present

## 2020-04-10 DIAGNOSIS — G40509 Epileptic seizures related to external causes, not intractable, without status epilepticus: Secondary | ICD-10-CM | POA: Diagnosis not present

## 2020-04-10 DIAGNOSIS — E871 Hypo-osmolality and hyponatremia: Secondary | ICD-10-CM

## 2020-04-10 DIAGNOSIS — K219 Gastro-esophageal reflux disease without esophagitis: Secondary | ICD-10-CM

## 2020-04-10 DIAGNOSIS — G4733 Obstructive sleep apnea (adult) (pediatric): Secondary | ICD-10-CM

## 2020-04-10 DIAGNOSIS — E119 Type 2 diabetes mellitus without complications: Secondary | ICD-10-CM | POA: Diagnosis not present

## 2020-04-10 DIAGNOSIS — M069 Rheumatoid arthritis, unspecified: Secondary | ICD-10-CM | POA: Diagnosis not present

## 2020-04-10 DIAGNOSIS — R770 Abnormality of albumin: Secondary | ICD-10-CM

## 2020-04-10 NOTE — Patient Instructions (Signed)
Visit Information  Ms. Elizabeth Bautista  - as a part of your Medicaid benefit, you are eligible for care management and care coordination services at no cost or copay. I was unable to reach you by phone today but would be happy to help you with your health related needs. Please feel free to call me @ [phone].     Gus Puma, BSW, [place] [facility]  [place]  High Risk Managed Medicaid Team

## 2020-04-10 NOTE — Patient Instructions (Signed)
Hi Elizabeth Bautista we missed you today- as a part of your Medicaid benefit, you are eligible for care management and care coordination services at no cost or copay. I was unable to reach you by phone today but would be happy to help you with your health related needs. Please feel free to call me at [phone]   Kathi Der RN, BSN [place]  [facility] Care Management Coordinator - Managed [place] High Risk [phone]

## 2020-04-10 NOTE — Patient Outreach (Signed)
Care Coordination  04/10/2020  Elizabeth Bautista July 30, 1975 [id number]  Dear [name],   Your health care team wants you to receive the best possible health care and quality of life. As a benefit of your Medicaid plan, you have access to a care coordination/care management team who can help you with care coordination services, connection to local community resources and personal assistance in managing your healthcare needs. These services are provided to you without cost.  We have been unsuccessful in our attempts to reach you by phone over the last few weeks but want to assure you that we are here to help. If you have questions or concerns or needs related to your healthcare, please reach out to your care coordination/care management team @ [phone]. If you do not reach one of the team members, please leave a message. We check our messages frequently and WILL return your call.   Sincerely,  Gus Puma, BSW, [place] [facility]  [place]  High Risk Managed Medicaid Team

## 2020-04-10 NOTE — Patient Outreach (Signed)
Care Coordination  04/10/2020  Analysa Nutting [birth year] [id number]  Third unsuccessful telephone outreach was attempted today. The patient was referred to the case management team for assistance with care management and care coordination. The patient's primary care provider has been notified of our unsuccessful attempts to make or maintain contact with the patient. The care management team is pleased to engage with this patient at any time in the future should he/she be interested in assistance from the care management team.   Follow Up Plan: The patient has been provided with contact information for the Managed Medicaid care management team and has been advised to call with any health related questions or concerns.  The Managed Medicaid care management team is available to follow up with the patient after provider conversation with the patient regarding recommendation for care management engagement and subsequent re-referral to the care management team.   Kathi Der RN, BSN [employer]   [facility] Care Management Coordinator - Managed [place] High Risk [phone]

## 2020-04-11 ENCOUNTER — Ambulatory Visit: Payer: Medicaid Other | Admitting: Allergy & Immunology

## 2020-04-16 ENCOUNTER — Ambulatory Visit (INDEPENDENT_AMBULATORY_CARE_PROVIDER_SITE_OTHER): Payer: Medicaid Other | Admitting: Cardiology

## 2020-04-16 ENCOUNTER — Other Ambulatory Visit: Payer: Self-pay

## 2020-04-16 ENCOUNTER — Encounter: Payer: Self-pay | Admitting: Cardiology

## 2020-04-16 VITALS — BP 120/80 | HR 74 | Ht 65.0 in | Wt 283.0 lb

## 2020-04-16 DIAGNOSIS — Z01812 Encounter for preprocedural laboratory examination: Secondary | ICD-10-CM

## 2020-04-16 DIAGNOSIS — I10 Essential (primary) hypertension: Secondary | ICD-10-CM

## 2020-04-16 DIAGNOSIS — R072 Precordial pain: Secondary | ICD-10-CM

## 2020-04-16 DIAGNOSIS — Z6841 Body Mass Index (BMI) 40.0 and over, adult: Secondary | ICD-10-CM

## 2020-04-16 DIAGNOSIS — Z7189 Other specified counseling: Secondary | ICD-10-CM

## 2020-04-16 DIAGNOSIS — Z712 Person consulting for explanation of examination or test findings: Secondary | ICD-10-CM | POA: Diagnosis not present

## 2020-04-16 MED ORDER — METOPROLOL TARTRATE 50 MG PO TABS: ORAL_TABLET | ORAL | 0 refills | Status: DC

## 2020-04-16 MED ORDER — ONDANSETRON HCL 4 MG PO TABS: 4.0000 mg | ORAL_TABLET | Freq: Three times a day (TID) | ORAL | 0 refills | Status: DC | PRN

## 2020-04-16 NOTE — Progress Notes (Signed)
Cardiology Office Note:    Date:  04/16/2020   ID:  Elizabeth Bautista, DOB 1975-10-01, MRN [id number]  PCP:  Patient, No Pcp Per  Cardiologist:  Buford Dresser, MD  Referring MD: Ailene Ards, NP   CC: new patient evaluation for chest pain  History of Present Illness:    Elizabeth Bautista is a 44 y.o. female with a hx of hypertension, OSA on CPAP, type II diabetes, rheumatoid arthritis, obesity who is seen as a new consult at the request of Ailene Ards, NP for the evaluation and management of chest pain.  I reviewed her recent hospitalization, discharged 03/13/20, treated for asthma exacerbation. Originally scheduled for post hospital follow up 10/18, 11/2 before today's visit.  Today: Told that there was something wrong with the left side of her heart after her echo. We reviewed today. Grade 1 diastolic dysfunction, discussed what that is/how it is managed.  Chest pain: -Initial onset: 2017 was initial episode -Quality: initially striking pain, ECG was with acute changes. Sharp tightness in left upper chest, never radiates.  -Frequency: more this year than other years. Was up to 4 times last week -Duration: last night lasted almost an hour--longest it has ever been. -Associated symptoms: nauseated, short of breath, sweaty -Aggravating/alleviating factors: nothing makes it better or worse. Random, nonexertional.  -Prior cardiac history: has had heart murmur since age 80 -Prior workup: echo 01/24/20 reviewed. Pending sleep study. -Prior treatment: none -Alcohol: very rare -Tobacco: former, quit 2015 -Comorbidities: Hypertension, diabetes, OSA on CPAP, BMI 47. Denies hyperlipidemia, chronic kidney disease. -Exercise level: no strenuous activity due to seizures -Cardiac ROS: no PND, no orthopnea, no LE edema, no syncope -Family history: Father w/diabetes, had multiple organ issues including end stage renal disease and heart disease. Sister has unclear heart issue.  Past Medical  History:  Diagnosis Date  . Anemia   . Asthma   . Diabetes ([place])    on Metformin  . Edema   . 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 HTN (hypertension)   . Rheumatoid arthritis ([place])    on Plaquenil   . Seizures ([place])    last seizure 2019  . Sleep apnea   . Sleep apnea     Past Surgical History:  Procedure Laterality Date  . APPENDECTOMY    . CESAREAN SECTION     x3    Current Medications: Current Outpatient Medications on File Prior to Visit  Medication Sig  . albuterol (PROAIR HFA) 108 (90 Base) MCG/ACT inhaler 2 puffs every 4 hours as needed only  if your can't catch your breath  . amLODipine (NORVASC) 5 MG tablet Take 1 tablet (5 mg total) by mouth daily.  . blood glucose meter kit and supplies Dispense based on patient and insurance preference. Use up to four times daily as directed. (FOR ICD-10 E10.9, E11.9).  Marland Kitchen Blood Pressure Monitoring (SPHYGMOMANOMETER) MISC 1 each by Does not apply route daily.  . busPIRone (BUSPAR) 7.5 MG tablet Take 7.5 mg by mouth 2 (two) times daily.  . Cholecalciferol 1.25 MG (50000 UT) TABS Take 1 tablet by mouth daily.  . cyclobenzaprine (FLEXERIL) 10 MG tablet Take 10 mg by mouth 3 (three) times daily as needed for muscle spasms.   Marland Kitchen escitalopram (LEXAPRO) 10 MG tablet Take 1 tablet (10 mg total) by mouth daily.  Marland Kitchen escitalopram (LEXAPRO) 20 MG tablet Take 1 tablet by mouth every  evening.  . famotidine (PEPCID) 20 MG tablet Take 20 mg by mouth daily.  . ferrous sulfate 325 (65 FE) MG tablet Take 325 mg by mouth 2 (two) times daily.  . fluticasone (CUTIVATE) 0.005 % ointment Apply 1 application topically daily.  . fluticasone (FLONASE) 50 MCG/ACT nasal spray Place 1 spray into both nostrils daily.  . furosemide (LASIX) 20 MG tablet Take 20 mg by mouth daily.  Marland Kitchen gabapentin (NEURONTIN) 100 MG capsule Take 1 capsule (100 mg total) by mouth 3 (three) times  daily.  . hydrochlorothiazide (HYDRODIURIL) 25 MG tablet Take 1 tablet (25 mg total) by mouth daily. For high blood pressure.  . hydroxychloroquine (PLAQUENIL) 200 MG tablet Take 1 tablet (200 mg total) by mouth 2 (two) times daily.  . hydrOXYzine (ATARAX/VISTARIL) 25 MG tablet Take 25 mg by mouth 2 (two) times daily.  Marland Kitchen ibuprofen (ADVIL) 800 MG tablet Take 1 tablet (800 mg total) by mouth every 8 (eight) hours as needed.  Marland Kitchen KLOR-CON M20 20 MEQ tablet TAKE 1 TABLET BY MOUTH DAILY ONLY WHEN YOU TAKE FUROSEMIDE. (Patient taking differently: 20 mEq daily. Only when you take Furosemide.)  . levETIRAcetam (KEPPRA) 500 MG tablet Take 1 tablet (500 mg total) by mouth 2 (two) times daily.  . mirtazapine (REMERON) 15 MG tablet Take 15 mg by mouth at bedtime as needed.  . montelukast (SINGULAIR) 10 MG tablet Take 1 tablet (10 mg total) by mouth at bedtime.  . pantoprazole (PROTONIX) 40 MG tablet Take 1 tablet (40 mg total) by mouth daily. Take 30-60 min before first meal of the day  . prazosin (MINIPRESS) 1 MG capsule Take 1 mg by mouth at bedtime.  Marland Kitchen QUEtiapine (SEROQUEL) 25 MG tablet Take 25 mg by mouth 2 (two) times daily.   . SYMBICORT 160-4.5 MCG/ACT inhaler Inhale 1 puff into the lungs in the morning and at bedtime.   No current facility-administered medications on file prior to visit.     Allergies:   Phenytoin and Phenylbutazones   Social History   Tobacco Use  . Smoking status: Former Smoker    Years: 10.00    Types: Cigarettes    Quit date: 06/02/2014    Years since quitting: 5.8  . Smokeless tobacco: Never Used  . Tobacco comment: during the 10 years of smoking, smoked 2-3 cigarettes/day  Vaping Use  . Vaping Use: Never used  Substance Use Topics  . Alcohol use: Not Currently  . Drug use: Not Currently    Family History: family history includes Asthma in her child, child, and maternal aunt; Breast cancer in her cousin; Cervical cancer in her maternal aunt; Dementia in her maternal  aunt; Diabetes in her brother, father, paternal aunt, paternal grandmother, sister, and another family member; Heart Problems in her maternal grandmother and sister; Heart attack in an other family member; High blood pressure in her mother and another family member; Seizures in her cousin, cousin, and cousin.  ROS:   Please see the history of present illness.  Additional pertinent ROS: Constitutional: Negative for chills, fever, night sweats, unintentional weight loss  HENT: Negative for ear pain and hearing loss.   Eyes: Negative for loss of vision and eye pain.  Respiratory: Negative for cough, sputum, wheezing.   Cardiovascular: See HPI. Gastrointestinal: Negative for abdominal pain, melena, and hematochezia.  Genitourinary: Negative for dysuria and hematuria.  Musculoskeletal: Negative for falls and myalgias.  Skin: Negative for itching and rash.  Neurological: Negative for focal weakness, focal sensory changes and  loss of consciousness.  Endo/Heme/Allergies: Does not bruise/bleed easily.     EKGs/Labs/Other Studies Reviewed:    The following studies were reviewed today: Echo 01/24/20 1. Left ventricular ejection fraction, by estimation, is 55 to 60%. The  left ventricle has normal function. The left ventricle has no regional  wall motion abnormalities. Left ventricular diastolic parameters are  consistent with Grade I diastolic  dysfunction (impaired relaxation).  2. Right ventricular systolic function is normal. The right ventricular  size is normal. There is normal pulmonary artery systolic pressure.  3. Left atrial size was moderately dilated.  4. The mitral valve is normal in structure. No evidence of mitral valve  regurgitation. No evidence of mitral stenosis.  5. The aortic valve has an indeterminant number of cusps. Aortic valve  regurgitation is mild. No aortic stenosis is present.  EKG:  EKG is personally reviewed.  The ekg ordered today demonstrates NSR at 74 bpm,  LVH  Recent Labs: 02/16/2020: TSH 1.050 03/08/2020: ALT 19 03/13/2020: BUN 11; Hemoglobin 10.3; Magnesium 2.1; Platelets 283; Potassium 3.3; Sodium 139 03/14/2020: Creatinine, Ser 0.66  Recent Lipid Panel    Component Value Date/Time   CHOL 212 (H) 01/18/2020 1500   TRIG 88 01/18/2020 1500   HDL 82 01/18/2020 1500   CHOLHDL 2.6 01/18/2020 1500   LDLCALC 111 (H) 01/18/2020 1500    Physical Exam:    VS:  BP 120/80 (BP Location: Left Arm, Patient Position: Sitting)   Pulse 74   Ht 5' 5"  (1.651 m)   Wt 283 lb (128.4 kg)   SpO2 97%   BMI 47.09 kg/m     Wt Readings from Last 3 Encounters:  04/04/20 293 lb 3.2 oz (133 kg)  04/02/20 280 lb (127 kg)  03/08/20 285 lb 7.9 oz (129.5 kg)    GEN: Well nourished, well developed in no acute distress HEENT: Normal, moist mucous membranes NECK: No JVD CARDIAC: regular rhythm, normal S1 and S2, no rubs or gallops. No murmurs. VASCULAR: Radial and DP pulses 2+ bilaterally. No carotid bruits RESPIRATORY:  Clear to auscultation without rales, wheezing or rhonchi  ABDOMEN: Soft, non-tender, non-distended MUSCULOSKELETAL:  Ambulates independently SKIN: Warm and dry, no edema NEUROLOGIC:  Alert and oriented x 3. No focal neuro deficits noted. PSYCHIATRIC:  Normal affect    ASSESSMENT:    1. Precordial pain   2. Pre-procedure lab exam   3. Encounter to discuss test results   4. Essential hypertension   5. Class 3 severe obesity due to excess calories with serious comorbidity and body mass index (BMI) of 45.0 to 49.9 in adult [facility])   6. Cardiac risk counseling   7. Counseling on health promotion and disease prevention    PLAN:    Precordial pain: -We spent significant time today reviewing different parts of the cardiovascular system (electrical, vascular, functional, and valvular). We discussed how each of these systems can present with different symptoms. We reviewed that there are different ways we evaluate these symptoms with tests. We  reviewed which tests I think are most appropriate given the symptoms, and we discussed risks/benefits and limitations of each of these tests. Please see summary below. We also discussed that if testing is unrevealing for a cardiac cause of the symptoms, there are many noncardiac causes as well that can contribute to symptoms. If the heart is ruled out, then I recommend returning to PCP to discuss alternative diagnoses. -discussed treadmill stress, nuclear stress/lexiscan, and CT coronary angiography. Discussed pros and cons of each,  including but not limited to false positive/false negative risk, radiation risk, and risk of IV contrast dye. Based on shared decision making, decision was made to pursue CT coronary angiography. -will give one time dose of metoprolol 2 hours prior to scheduled test -counseled on need to get BMET prior to test -counseled on use of sublingual nitroglycerin and its importance to a good test -counseled on red flag warning signs that need immediate medical attention  Hypertension: -reviewed echo together, discussed what diastolic dysfunction is and how hypertension plays a role -at goal today, continue current medications  Type II diabetes: -last A1c 6.9% -not on insulin -if CAD noted, consider SGLT2i or GLP1RA  Class 3 severe obesity: BMI 47. Discussed weight loss, diet/exercise recommendations  Cardiac risk counseling and prevention recommendations: -recommend heart healthy/Mediterranean diet, with whole grains, fruits, vegetable, fish, lean meats, nuts, and olive oil. Limit salt. -recommend moderate walking, 3-5 times/week for 30-50 minutes each session. Aim for at least 150 minutes.week. Goal should be pace of 3 miles/hours, or walking 1.5 miles in 30 minutes -recommend avoidance of tobacco products. Avoid excess alcohol. -ASCVD risk score: The 10-year ASCVD risk score Mikey Bussing DC Brooke Bonito., et al., 2013) is: 3.1%   Values used to calculate the score:     Age: 62 years      Sex: Female     Is Non-Hispanic African American: Yes     Diabetic: Yes     Tobacco smoker: No     Systolic Blood Pressure: 846 mmHg     Is BP treated: Yes     HDL Cholesterol: 82 mg/dL     Total Cholesterol: 212 mg/dL    Plan for follow up: to be determined based on results of testing  Buford Dresser, MD, PhD [place]  [facility]    Medication Adjustments/Labs and Tests Ordered: Current medicines are reviewed at length with the patient today.  Concerns regarding medicines are outlined above.  Orders Placed This Encounter  Procedures  . CT CORONARY MORPH W/CTA COR W/SCORE W/CA W/CM &/OR WO/CM  . CT CORONARY FRACTIONAL FLOW RESERVE DATA PREP  . CT CORONARY FRACTIONAL FLOW RESERVE FLUID ANALYSIS  . Basic metabolic panel  . EKG 12-Lead   Meds ordered this encounter  Medications  . DISCONTD: ondansetron (ZOFRAN) 4 MG tablet    Sig: Take 1 tablet (4 mg total) by mouth every 8 (eight) hours as needed for nausea or vomiting.    Dispense:  20 tablet    Refill:  0  . metoprolol tartrate (LOPRESSOR) 50 MG tablet    Sig: TAKE 1 TABLET 2 HR PRIOR TO CARDIAC PROCEDURE    Dispense:  1 tablet    Refill:  0    Patient Instructions  Medication Instructions:  Your Physician recommend you continue on your current medication as directed.    *If you need a refill on your cardiac medications before your next appointment, please call your pharmacy*   Lab Work: Your physician recommends lab work today ( BMP).  If you have labs (blood work) drawn today and your tests are completely normal, you will receive your results only by: Marland Kitchen MyChart Message (if you have MyChart) OR . A paper copy in the mail If you have any lab test that is abnormal or we need to change your treatment, we will call you to review the results.   Testing/Procedures: Non-Cardiac CT Angiography (CTA), is a special type of CT scan that uses a computer to produce multi-dimensional views of  major blood  vessels throughout the body. In CT angiography, a contrast material is injected through an IV to help visualize the blood vessels [facility]  Follow-Up: At [facility], you and your health needs are our priority.  As part of our continuing mission to provide you with exceptional heart care, we have created designated Provider Care Teams.  These Care Teams include your primary Cardiologist (physician) and Advanced Practice Providers (APPs -  Physician Assistants and Nurse Practitioners) who all work together to provide you with the care you need, when you need it.  We recommend signing up for the patient portal called "MyChart".  Sign up information is provided on this After Visit Summary.  MyChart is used to connect with patients for Virtual Visits (Telemedicine).  Patients are able to view lab/test results, encounter notes, upcoming appointments, etc.  Non-urgent messages can be sent to your provider as well.   To learn more about what you can do with MyChart, go to [email or web address].    Your next appointment:   As needed   The format for your next appointment:   In Person  Provider:   Buford Dresser, MD     Your cardiac CT will be scheduled at one of the below locations:   [facility] [address] [place], [place] [address] [phone]  If scheduled at [facility], please arrive at the [facility] main entrance of [facility] 30 minutes prior to test start time. Proceed to the [facility] Radiology Department (first floor) to check-in and test prep.  If scheduled at [facility], please arrive 15 mins early for check-in and test prep.  Please follow these instructions carefully (unless otherwise directed):  On the Night Before the Test: . Be sure to Drink plenty of water. . Do not consume any caffeinated/decaffeinated beverages or chocolate 12 hours prior to your test. . Do not take any  antihistamines 12 hours prior to your test.   On the Day of the Test: . Drink plenty of water. Do not drink any water within one hour of the test. . Do not eat any food 4 hours prior to the test. . You may take your regular medications prior to the test.  . Take metoprolol (Lopressor) 50 mg and dose of ondansetron (zofran) two hours prior to test. . HOLD Furosemide and Hydrochlorothiazide morning of the test. . FEMALES- please wear underwire-free bra if available       After the Test: . Drink plenty of water. . After receiving IV contrast, you may experience a mild flushed feeling. This is normal. . On occasion, you may experience a mild rash up to 24 hours after the test. This is not dangerous. If this occurs, you can take Benadryl 25 mg and increase your fluid intake. . If you experience trouble breathing, this can be serious. If it is severe call 911 IMMEDIATELY. If it is mild, please call our office. . If you take any of these medications: Glipizide/Metformin, Avandament, Glucavance, please do not take 48 hours after completing test unless otherwise instructed.   Once we have confirmed authorization from your insurance company, we will call you to set up a date and time for your test. Based on how quickly your insurance processes prior authorizations requests, please allow up to 4 weeks to be contacted for scheduling your Cardiac CT appointment. Be advised that routine Cardiac CT appointments could be scheduled as many as 8 weeks after your provider  has ordered it.  For non-scheduling related questions, please contact the cardiac imaging nurse navigator should you have any questions/concerns: Marchia Bond, Cardiac Imaging Nurse Navigator Burley Saver, Interim Cardiac Imaging Nurse [place] [facility] Direct Office Dial: [phone]   For scheduling needs, including cancellations and rescheduling, please call [place], [phone] (temporary number).        Signed, Buford Dresser, MD PhD 04/16/2020   [place]

## 2020-04-16 NOTE — Patient Instructions (Addendum)
Medication Instructions:  Your Physician recommend you continue on your current medication as directed.    *If you need a refill on your cardiac medications before your next appointment, please call your pharmacy*   Lab Work: Your physician recommends lab work today ( BMP).  If you have labs (blood work) drawn today and your tests are completely normal, you will receive your results only by: Marland Kitchen MyChart Message (if you have MyChart) OR . A paper copy in the mail If you have any lab test that is abnormal or we need to change your treatment, we will call you to review the results.   Testing/Procedures: Non-Cardiac CT Angiography (CTA), is a special type of CT scan that uses a computer to produce multi-dimensional views of major blood vessels throughout the body. In CT angiography, a contrast material is injected through an IV to help visualize the blood vessels [facility]  Follow-Up: At [facility], you and your health needs are our priority.  As part of our continuing mission to provide you with exceptional heart care, we have created designated Provider Care Teams.  These Care Teams include your primary Cardiologist (physician) and Advanced Practice Providers (APPs -  Physician Assistants and Nurse Practitioners) who all work together to provide you with the care you need, when you need it.  We recommend signing up for the patient portal called "MyChart".  Sign up information is provided on this After Visit Summary.  MyChart is used to connect with patients for Virtual Visits (Telemedicine).  Patients are able to view lab/test results, encounter notes, upcoming appointments, etc.  Non-urgent messages can be sent to your provider as well.   To learn more about what you can do with MyChart, go to [email or web address].    Your next appointment:   As needed   The format for your next appointment:   In Person  Provider:   Buford Dresser, MD     Your cardiac CT will  be scheduled at one of the below locations:   [facility] [address] [place], [place] [address] [phone]  If scheduled at [facility], please arrive at the [facility] main entrance of [facility] 30 minutes prior to test start time. Proceed to the [facility] Radiology Department (first floor) to check-in and test prep.  If scheduled at [facility], please arrive 15 mins early for check-in and test prep.  Please follow these instructions carefully (unless otherwise directed):  On the Night Before the Test: . Be sure to Drink plenty of water. . Do not consume any caffeinated/decaffeinated beverages or chocolate 12 hours prior to your test. . Do not take any antihistamines 12 hours prior to your test.   On the Day of the Test: . Drink plenty of water. Do not drink any water within one hour of the test. . Do not eat any food 4 hours prior to the test. . You may take your regular medications prior to the test.  . Take metoprolol (Lopressor) 50 mg and dose of ondansetron (zofran) two hours prior to test. . HOLD Furosemide and Hydrochlorothiazide morning of the test. . FEMALES- please wear underwire-free bra if available       After the Test: . Drink plenty of water. . After receiving IV contrast, you may experience a mild flushed feeling. This is normal. . On occasion, you may experience a mild rash up to 24 hours after the test. This is not dangerous. If  this occurs, you can take Benadryl 25 mg and increase your fluid intake. . If you experience trouble breathing, this can be serious. If it is severe call 911 IMMEDIATELY. If it is mild, please call our office. . If you take any of these medications: Glipizide/Metformin, Avandament, Glucavance, please do not take 48 hours after completing test unless otherwise instructed.   Once we have confirmed authorization from your insurance company, we will call you to set up a date  and time for your test. Based on how quickly your insurance processes prior authorizations requests, please allow up to 4 weeks to be contacted for scheduling your Cardiac CT appointment. Be advised that routine Cardiac CT appointments could be scheduled as many as 8 weeks after your provider has ordered it.  For non-scheduling related questions, please contact the cardiac imaging nurse navigator should you have any questions/concerns: Marchia Bond, Cardiac Imaging Nurse Navigator Burley Saver, Interim Cardiac Imaging Nurse [facility] Direct Office Dial: [phone]   For scheduling needs, including cancellations and rescheduling, please call [place], [phone] (temporary number).

## 2020-04-17 ENCOUNTER — Encounter: Payer: Medicaid Other | Admitting: Adult Health

## 2020-04-17 LAB — BASIC METABOLIC PANEL
BUN/Creatinine Ratio: 10 (ref 9–23)
BUN: 7 mg/dL (ref 6–24)
CO2: 24 mmol/L (ref 20–29)
Calcium: 9.2 mg/dL (ref 8.7–10.2)
Chloride: 104 mmol/L (ref 96–106)
Creatinine, Ser: 0.68 mg/dL (ref 0.57–1.00)
GFR calc Af Amer: 121 mL/min/{1.73_m2} (ref >59)
GFR calc non Af Amer: 105 mL/min/{1.73_m2} (ref >59)
Glucose: 115 mg/dL — ABNORMAL HIGH (ref 65–99)
Potassium: 4.3 mmol/L (ref 3.5–5.2)
Sodium: 142 mmol/L (ref 134–144)

## 2020-04-24 ENCOUNTER — Ambulatory Visit (INDEPENDENT_AMBULATORY_CARE_PROVIDER_SITE_OTHER): Payer: Medicaid Other | Admitting: Internal Medicine

## 2020-04-27 ENCOUNTER — Other Ambulatory Visit: Payer: Self-pay

## 2020-04-27 ENCOUNTER — Emergency Department (HOSPITAL_COMMUNITY): Payer: Medicaid Other

## 2020-04-27 ENCOUNTER — Emergency Department (HOSPITAL_COMMUNITY)
Admission: EM | Admit: 2020-04-27 | Discharge: 2020-04-27 | Disposition: A | Discharge status: Home / Self Care | Payer: Medicaid Other | Attending: Emergency Medicine | Admitting: Emergency Medicine

## 2020-04-27 ENCOUNTER — Encounter (HOSPITAL_COMMUNITY): Payer: Self-pay

## 2020-04-27 DIAGNOSIS — R059 Cough, unspecified: Secondary | ICD-10-CM | POA: Diagnosis present

## 2020-04-27 DIAGNOSIS — I10 Essential (primary) hypertension: Secondary | ICD-10-CM | POA: Diagnosis not present

## 2020-04-27 DIAGNOSIS — Z7901 Long term (current) use of anticoagulants: Secondary | ICD-10-CM | POA: Diagnosis not present

## 2020-04-27 DIAGNOSIS — E1165 Type 2 diabetes mellitus with hyperglycemia: Secondary | ICD-10-CM | POA: Insufficient documentation

## 2020-04-27 DIAGNOSIS — Z87891 Personal history of nicotine dependence: Secondary | ICD-10-CM | POA: Diagnosis not present

## 2020-04-27 DIAGNOSIS — U071 COVID-19: Secondary | ICD-10-CM | POA: Insufficient documentation

## 2020-04-27 DIAGNOSIS — Z79899 Other long term (current) drug therapy: Secondary | ICD-10-CM | POA: Diagnosis not present

## 2020-04-27 DIAGNOSIS — J441 Chronic obstructive pulmonary disease with (acute) exacerbation: Secondary | ICD-10-CM | POA: Insufficient documentation

## 2020-04-27 LAB — RESP PANEL BY RT-PCR (FLU A&B, COVID) ARPGX2
Influenza A by PCR: NEGATIVE
Influenza B by PCR: NEGATIVE
SARS Coronavirus 2 by RT PCR: POSITIVE — AB

## 2020-04-27 IMAGING — DX DG CHEST 1V PORT
1 series · 1 of 1 positions shown · non-contrast
Comparison: Chest radiograph [DATE]

CLINICAL DATA: Weakness, cough, [GI] test positive.

EXAM:
PORTABLE CHEST 1 VIEW

[chest ap grid]
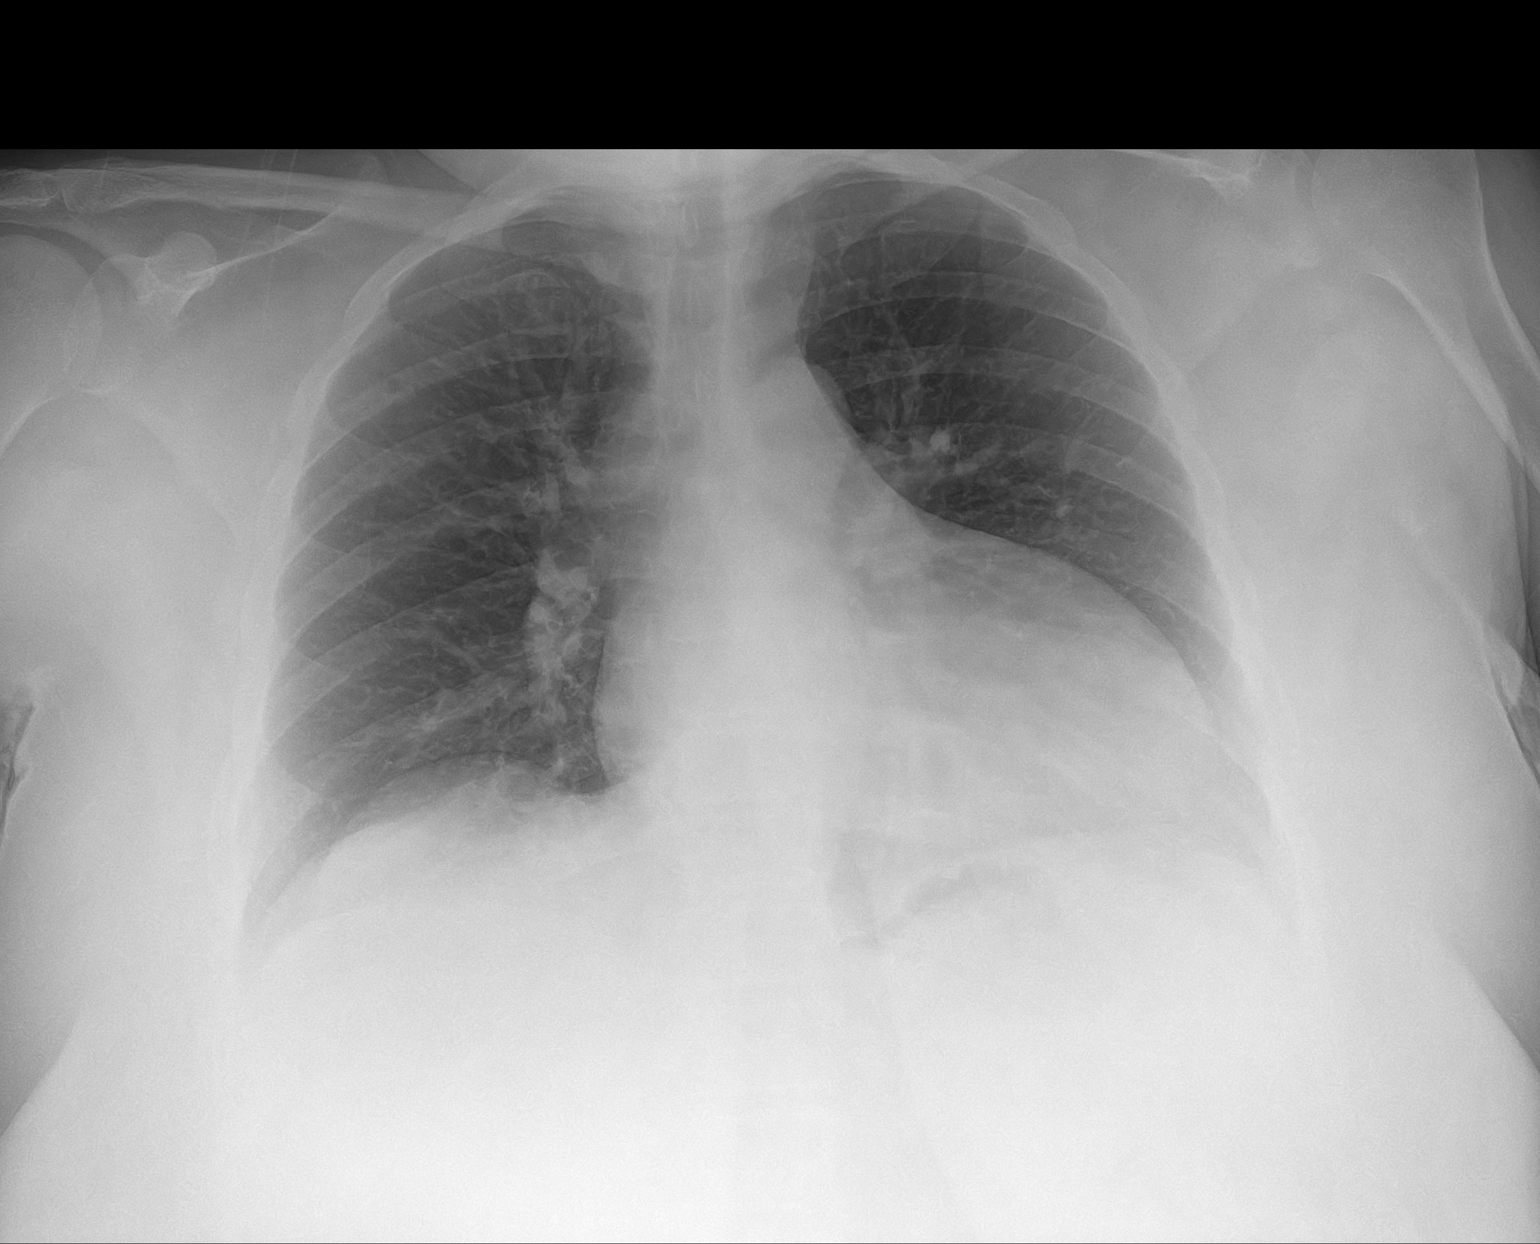

[1 of 1 positions shown; findings below may reference images not displayed]

FINDINGS: Borderline enlargement of the cardiopericardial silhouette the lungs
appear clear. Mediastinum unremarkable. No significant bony
abnormality is identified.
IMPRESSION: 1. Borderline enlargement of the cardiopericardial silhouette. The
lungs appear clear.

## 2020-04-27 NOTE — ED Triage Notes (Signed)
Pt to er, pt states that she is here for a cold that she has had since last week, states that she has felt progressively more weak, states that now she has also lost her sense of smell and taste,  Pt states that she has a cough, and runny nose, denies vomiting.

## 2020-04-27 NOTE — Discharge Instructions (Addendum)
As discussed you have tested positive for COVID-19.  You will need to stay in home quarantine for an additional 8 days as a 10-day quarantine is required by the [facility] and it appears her symptoms started 2 days ago.  Rest to make sure you are drinking plenty of fluids.  You may take Tylenol if needed for body aches, headache or development of any fever.  You will need to get rechecked immediately for any increasing shortness of breath or weakness as discussed.   Your chest x-ray is clear with no signs of Covid pneumonia.  I have contacted the monoclonal antibody infusion clinic as discussed.  You are most likely a candidate for this treatment and should receive a call from them within the next 24 hours to schedule this treatment.  This can improve the likelihood of developing worsening symptoms from this infection.     Person Under Monitoring Name: Elizabeth Bautista  Location: [address] [place] [address]   Infection Prevention Recommendations for Individuals Confirmed to have, or Being Evaluated for, 2019 Novel Coronavirus (COVID-19) Infection Who Receive Care at Home  Individuals who are confirmed to have, or are being evaluated for, COVID-19 should follow the prevention steps below until a healthcare provider or local or state health department says they can return to normal activities.  Stay home except to get medical care You should restrict activities outside your home, except for getting medical care. Do not go to work, school, or public areas, and do not use public transportation or taxis.  Call ahead before visiting your doctor Before your medical appointment, call the healthcare provider and tell them that you have, or are being evaluated for, COVID-19 infection. This will help the healthcare provider's office take steps to keep other people from getting infected. Ask your healthcare provider to call the local or state health department.  Monitor your symptoms Seek prompt  medical attention if your illness is worsening (e.g., difficulty breathing). Before going to your medical appointment, call the healthcare provider and tell them that you have, or are being evaluated for, COVID-19 infection. Ask your healthcare provider to call the local or state health department.  Wear a facemask You should wear a facemask that covers your nose and mouth when you are in the same room with other people and when you visit a healthcare provider. People who live with or visit you should also wear a facemask while they are in the same room with you.  Separate yourself from other people in your home As much as possible, you should stay in a different room from other people in your home. Also, you should use a separate bathroom, if available.  Avoid sharing household items You should not share dishes, drinking glasses, cups, eating utensils, towels, bedding, or other items with other people in your home. After using these items, you should wash them thoroughly with soap and water.  Cover your coughs and sneezes Cover your mouth and nose with a tissue when you cough or sneeze, or you can cough or sneeze into your sleeve. Throw used tissues in a lined trash can, and immediately wash your hands with soap and water for at least 20 seconds or use an alcohol-based hand rub.  Wash your [employer] your hands often and thoroughly with soap and water for at least 20 seconds. You can use an alcohol-based hand sanitizer if soap and water are not available and if your hands are not visibly dirty. Avoid touching your eyes, nose,  and mouth with unwashed hands.   Prevention Steps for Caregivers and Household Members of Individuals Confirmed to have, or Being Evaluated for, COVID-19 Infection Being Cared for in the Home  If you live with, or provide care at home for, a person confirmed to have, or being evaluated for, COVID-19 infection please follow these guidelines to prevent  infection:  Follow healthcare provider's instructions Make sure that you understand and can help the patient follow any healthcare provider instructions for all care.  Provide for the patient's basic needs You should help the patient with basic needs in the home and provide support for getting groceries, prescriptions, and other personal needs.  Monitor the patient's symptoms If they are getting sicker, call his or her medical provider and tell them that the patient has, or is being evaluated for, COVID-19 infection. This will help the healthcare provider's office take steps to keep other people from getting infected. Ask the healthcare provider to call the local or state health department.  Limit the number of people who have contact with the patient If possible, have only one caregiver for the patient. Other household members should stay in another home or place of residence. If this is not possible, they should stay in another room, or be separated from the patient as much as possible. Use a separate bathroom, if available. Restrict visitors who do not have an essential need to be in the home.  Keep older adults, very young children, and other sick people away from the patient Keep older adults, very young children, and those who have compromised immune systems or chronic health conditions away from the patient. This includes people with chronic heart, lung, or kidney conditions, diabetes, and cancer.  Ensure good ventilation Make sure that shared spaces in the home have good air flow, such as from an air conditioner or an opened window, weather permitting.  Wash your hands often Wash your hands often and thoroughly with soap and water for at least 20 seconds. You can use an alcohol based hand sanitizer if soap and water are not available and if your hands are not visibly dirty. Avoid touching your eyes, nose, and mouth with unwashed hands. Use disposable paper towels to dry your  hands. If not available, use dedicated cloth towels and replace them when they become wet.  Wear a facemask and gloves Wear a disposable facemask at all times in the room and gloves when you touch or have contact with the patient's blood, body fluids, and/or secretions or excretions, such as sweat, saliva, sputum, nasal mucus, vomit, urine, or feces.  Ensure the mask fits over your nose and mouth tightly, and do not touch it during use. Throw out disposable facemasks and gloves after using them. Do not reuse. Wash your hands immediately after removing your facemask and gloves. If your personal clothing becomes contaminated, carefully remove clothing and launder. Wash your hands after handling contaminated clothing. Place all used disposable facemasks, gloves, and other waste in a lined container before disposing them with other household waste. Remove gloves and wash your hands immediately after handling these items.  Do not share dishes, glasses, or other household items with the patient Avoid sharing household items. You should not share dishes, drinking glasses, cups, eating utensils, towels, bedding, or other items with a patient who is confirmed to have, or being evaluated for, COVID-19 infection. After the person uses these items, you should wash them thoroughly with soap and water.  Wash laundry thoroughly Immediately remove and  wash clothes or bedding that have blood, body fluids, and/or secretions or excretions, such as sweat, saliva, sputum, nasal mucus, vomit, urine, or feces, on them. Wear gloves when handling laundry from the patient. Read and follow directions on labels of laundry or clothing items and detergent. In general, wash and dry with the warmest temperatures recommended on the label.  Clean all areas the individual has used often Clean all touchable surfaces, such as counters, tabletops, doorknobs, bathroom fixtures, toilets, phones, keyboards, tablets, and bedside tables,  every day. Also, clean any surfaces that may have blood, body fluids, and/or secretions or excretions on them. Wear gloves when cleaning surfaces the patient has come in contact with. Use a diluted bleach solution (e.g., dilute bleach with 1 part bleach and 10 parts water) or a household disinfectant with a label that says EPA-registered for coronaviruses. To make a bleach solution at home, add 1 tablespoon of bleach to 1 quart (4 cups) of water. For a larger supply, add  cup of bleach to 1 gallon (16 cups) of water. Read labels of cleaning products and follow recommendations provided on product labels. Labels contain instructions for safe and effective use of the cleaning product including precautions you should take when applying the product, such as wearing gloves or eye protection and making sure you have good ventilation during use of the product. Remove gloves and wash hands immediately after cleaning.  Monitor yourself for signs and symptoms of illness Caregivers and household members are considered close contacts, should monitor their health, and will be asked to limit movement outside of the home to the extent possible. Follow the monitoring steps for close contacts listed on the symptom monitoring form.   ? If you have additional questions, contact your local health department or call the epidemiologist on call at [phone] (available 24/7). ? This guidance is subject to change. For the most up-to-date guidance from [facility], please refer to their website: TripMetro.hu

## 2020-04-27 NOTE — ED Notes (Signed)
Date and time results received: 04/27/20  1911  Test: Covid 19 Critical Value: positive Name of Provider Notified: Burgess Amor PA-C Orders Received? Or Actions Taken?:none

## 2020-04-27 NOTE — ED Notes (Signed)
Patient discharged home to self.  All discharge instructions reviewed.  Patient able to demonstrate knowledge/understanding via teachback method.  0 s/s acute distress.

## 2020-04-28 ENCOUNTER — Telehealth: Payer: Self-pay | Admitting: Physician Assistant

## 2020-04-28 ENCOUNTER — Telehealth: Payer: Self-pay | Admitting: Unknown Physician Specialty

## 2020-04-28 NOTE — Telephone Encounter (Signed)
Called to Discuss with patient about Covid symptoms and the use of the monoclonal antibody infusion for those with mild to moderate Covid symptoms and at a high risk of hospitalization.     Pt appears to qualify for this infusion due to co-morbid conditions and/or a member of an at-risk group in accordance with the FDA Emergency Use Authorization.    Unable to reach pt    

## 2020-04-28 NOTE — Telephone Encounter (Signed)
Called to Discuss with patient about Covid symptoms and the use of the monoclonal antibody infusion for those with mild to moderate Covid symptoms and at a high risk of hospitalization.     Pt appears to qualify for this infusion due to co-morbid conditions and/or a member of an at-risk group in accordance with the FDA Emergency Use Authorization.    Unable to reach pt. Left voice mail to call back.   

## 2020-04-28 NOTE — ED Provider Notes (Signed)
[facility] EMERGENCY DEPARTMENT Provider Note   CSN: [id number] Arrival date & time: 04/27/20  1721     History Chief Complaint  Patient presents with  . Chills  . Cough    Elizabeth Bautista is a 44 y.o. female with a history of DM, htn, rheumatoid arthritis on Plaquenil and asthma with seasonal allergy presenting with suspect Covid 19.  She reports having increased allergy and asthma symptoms last week including itchy, watery eyes, clear rhinorrhea, sneezing and some mild wheezing 11 days ago at which time she increased the use of her albuterol inhaler and flonase.  Two days ago she developed generalized fatigue, subjective fever, non productive cough and has lost her sense of taste and smell. Her son whom lives with her has similar symptoms that started last weekend. He has had positive exposures at work.  Neither have been vaccinated for Covid 19. She denies sob, chest pain, dizziness, n/v/d.   HPI     Past Medical History:  Diagnosis Date  . Anemia   . Asthma   . Diabetes ([place])    on Metformin  . Edema   . 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 HTN (hypertension)   . Rheumatoid arthritis ([place])    on Plaquenil   . Seizures ([place])    last seizure 2019  . Sleep apnea   . Sleep apnea     Patient Active Problem List   Diagnosis Date Noted  . Acute asthma exacerbation 03/08/2020  . GERD (gastroesophageal reflux disease) 03/07/2020  . Anxiety 03/07/2020  . History of seizures 03/07/2020  . Hypokalemia 03/07/2020  . Upper airway cough syndrome 02/10/2020  . Swelling of lower extremity 02/01/2020  . Hypertension 02/01/2020  . Vitamin D deficiency 02/01/2020  . Type 2 diabetes mellitus with hyperglycemia, without long-term current use of insulin ([place]) 02/01/2020  . Anemia 02/01/2020  . Class 3 severe obesity with serious comorbidity and body mass index (BMI) of 45.0 to 49.9 in adult [facility])  10/22/2019  . Asthma exacerbation 10/18/2019  . OSA on CPAP 10/18/2019  . Depression 10/18/2019  . Rheumatoid arthritis ([place]) 10/18/2019    Past Surgical History:  Procedure Laterality Date  . APPENDECTOMY    . CESAREAN SECTION     x3     OB History   No obstetric history on file.     Family History  Problem Relation Age of Onset  . High blood pressure Mother   . Diabetes Father   . Diabetes Sister   . Heart Problems Sister   . Diabetes Brother   . Diabetes Paternal Grandmother   . Diabetes Other        father's side "everybody died from Diabetes"  . High blood pressure Other        mother's side, multiple siblings with this   . Heart attack Other        family member on mother's side   . Diabetes Paternal Aunt   . Seizures Cousin        not sibings to the other cousins with seizures  . Seizures Cousin        not sibings to the other cousins with seizures  . Seizures Cousin        not sibings to the other cousins with seizures  . Cervical cancer Maternal Aunt   . Breast cancer Cousin   . Dementia Maternal Aunt   .  Asthma Maternal Aunt   . Heart Problems Maternal Grandmother   . Asthma Child   . Asthma Child     Social History   Tobacco Use  . Smoking status: Former Smoker    Years: 10.00    Types: Cigarettes    Quit date: 06/02/2014    Years since quitting: 5.9  . Smokeless tobacco: Never Used  . Tobacco comment: during the 10 years of smoking, smoked 2-3 cigarettes/day  Vaping Use  . Vaping Use: Never used  Substance Use Topics  . Alcohol use: Not Currently  . Drug use: Not Currently    Home Medications Prior to Admission medications   Medication Sig Start Date End Date Taking? Authorizing Provider  albuterol (PROAIR HFA) 108 (90 Base) MCG/ACT inhaler 2 puffs every 4 hours as needed only  if your can't catch your breath 02/09/20  Yes Tanda Rockers, MD  amLODipine (NORVASC) 5 MG tablet Take 1 tablet (5 mg total) by mouth daily. 03/22/20  Yes  Gosrani, Nimish C, MD  busPIRone (BUSPAR) 7.5 MG tablet Take 7.5 mg by mouth 2 (two) times daily. 09/05/19  Yes [provider]  Cholecalciferol 1.25 MG (50000 UT) TABS Take 1 tablet by mouth daily. 02/01/20  Yes Ailene Ards, NP  cyclobenzaprine (FLEXERIL) 10 MG tablet Take 10 mg by mouth 3 (three) times daily as needed for muscle spasms.  08/27/19  Yes [provider]  escitalopram (LEXAPRO) 10 MG tablet Take 1 tablet (10 mg total) by mouth daily. 03/13/20 04/27/20 Yes Shah, Pratik D, DO  famotidine (PEPCID) 20 MG tablet Take 20 mg by mouth daily. 07/31/19  Yes [provider]  ferrous sulfate 325 (65 FE) MG tablet Take 325 mg by mouth 2 (two) times daily. 05/17/19  Yes [provider]  fluticasone (CUTIVATE) 0.005 % ointment Apply 1 application topically daily. 10/11/19  Yes [provider]  fluticasone (FLONASE) 50 MCG/ACT nasal spray Place 1 spray into both nostrils daily. 04/04/20  Yes Chesley Mires, MD  furosemide (LASIX) 20 MG tablet Take 20 mg by mouth daily. 03/09/20  Yes [provider]  gabapentin (NEURONTIN) 100 MG capsule Take 1 capsule (100 mg total) by mouth 3 (three) times daily. 02/27/20  Yes Melvenia Beam, MD  hydrochlorothiazide (HYDRODIURIL) 25 MG tablet Take 1 tablet (25 mg total) by mouth daily. For high blood pressure. 02/01/20  Yes Ailene Ards, NP  hydroxychloroquine (PLAQUENIL) 200 MG tablet Take 1 tablet (200 mg total) by mouth 2 (two) times daily. 02/20/20  Yes Ailene Ards, NP  hydrOXYzine (ATARAX/VISTARIL) 25 MG tablet Take 25 mg by mouth 2 (two) times daily. 02/01/20  Yes [provider]  ibuprofen (ADVIL) 800 MG tablet Take 1 tablet (800 mg total) by mouth every 8 (eight) hours as needed. 02/20/20  Yes Ailene Ards, NP  KLOR-CON M20 20 MEQ tablet TAKE 1 TABLET BY MOUTH DAILY ONLY WHEN YOU TAKE FUROSEMIDE. Patient taking differently: 20 mEq daily. Only when you take Furosemide. 03/05/20  Yes Gosrani, Nimish C, MD   metoprolol tartrate (LOPRESSOR) 50 MG tablet TAKE 1 TABLET 2 HR PRIOR TO CARDIAC PROCEDURE 04/16/20  Yes Buford Dresser, MD  mirtazapine (REMERON) 15 MG tablet Take 15 mg by mouth at bedtime as needed. 02/29/20  Yes [provider]  mirtazapine (REMERON) 30 MG tablet Take 30 mg by mouth at bedtime. 03/30/20  Yes [provider]  ondansetron (ZOFRAN) 4 MG tablet Take 1 tablet (4 mg total) by mouth every  8 (eight) hours as needed for nausea or vomiting. 04/16/20  Yes Buford Dresser, MD  pantoprazole (PROTONIX) 40 MG tablet Take 1 tablet (40 mg total) by mouth daily. Take 30-60 min before first meal of the day 02/09/20  Yes Tanda Rockers, MD  prazosin (MINIPRESS) 1 MG capsule Take 1 mg by mouth at bedtime. 02/29/20  Yes [provider]  predniSONE (DELTASONE) 10 MG tablet Take 10 mg by mouth 2 (two) times daily with a meal.  04/10/20  Yes [provider]  QUEtiapine (SEROQUEL) 25 MG tablet Take 25 mg by mouth 2 (two) times daily.  09/05/19  Yes [provider]  SYMBICORT 160-4.5 MCG/ACT inhaler Inhale 1 puff into the lungs in the morning and at bedtime. 01/19/20  Yes [provider]  blood glucose meter kit and supplies Dispense based on patient and insurance preference. Use up to four times daily as directed. (FOR ICD-10 E10.9, E11.9). 02/01/20   Ailene Ards, NP  Blood Pressure Monitoring [facility]) MISC 1 each by Does not apply route daily. 01/18/20   Ailene Ards, NP  escitalopram (LEXAPRO) 20 MG tablet Take 1 tablet by mouth every evening. 02/29/20   [provider]  levETIRAcetam (KEPPRA) 500 MG tablet Take 1 tablet (500 mg total) by mouth 2 (two) times daily. 03/13/20 04/12/20  Manuella Ghazi, Pratik D, DO  montelukast (SINGULAIR) 10 MG tablet Take 1 tablet (10 mg total) by mouth at bedtime. 03/13/20 04/12/20  Manuella Ghazi, Pratik D, DO    Allergies    Phenytoin and Phenylbutazones  Review of Systems   Review of Systems   Constitutional: Positive for chills, fatigue and fever.  HENT: Positive for rhinorrhea and sneezing. Negative for congestion, ear pain, sinus pressure, sore throat, trouble swallowing and voice change.   Eyes: Negative for discharge.  Respiratory: Positive for cough and wheezing. Negative for shortness of breath and stridor.   Cardiovascular: Negative for chest pain.  Gastrointestinal: Negative for abdominal pain, diarrhea, nausea and vomiting.  Genitourinary: Negative.   Musculoskeletal: Positive for myalgias.  All other systems reviewed and are negative.   Physical Exam Updated Vital Signs BP 130/87 (BP Location: Right Arm)   Pulse 85   Temp 98.1 F (36.7 C) (Oral)   Resp 18   Ht 5' 5" (1.651 m)   Wt 128.8 kg   SpO2 100%   BMI 47.26 kg/m   Physical Exam Constitutional:      Appearance: She is well-developed.  HENT:     Head: Normocephalic and atraumatic.     Nose: Mucosal edema and rhinorrhea present.     Mouth/Throat:     Mouth: Mucous membranes are moist.     Pharynx: Uvula midline. No oropharyngeal exudate or posterior oropharyngeal erythema.     Tonsils: No tonsillar abscesses.  Eyes:     Conjunctiva/sclera: Conjunctivae normal.  Cardiovascular:     Rate and Rhythm: Normal rate and regular rhythm.     Heart sounds: Normal heart sounds.  Pulmonary:     Effort: Pulmonary effort is normal. No respiratory distress.     Breath sounds: No wheezing, rhonchi or rales.     Comments: ctab Abdominal:     Palpations: Abdomen is soft.     Tenderness: There is no abdominal tenderness. There is no guarding.  Musculoskeletal:        General: Normal range of motion.     Cervical back: Normal range of motion and neck supple.  Skin:    General: Skin is  warm and dry.     Findings: No rash.  Neurological:     General: No focal deficit present.     Mental Status: She is alert and oriented to person, place, and time.     ED Results / Procedures / Treatments   Labs (all  labs ordered are listed, but only abnormal results are displayed) Labs Reviewed  RESP PANEL BY RT-PCR (FLU A&B, COVID) ARPGX2 - Abnormal; Notable for the following components:      Result Value   SARS Coronavirus 2 by RT PCR POSITIVE (*)    All other components within normal limits    EKG None  Radiology DG Chest Portable 1 View  Result Date: 04/27/2020 CLINICAL DATA:  Weakness, cough, COVID-19 test positive. EXAM: PORTABLE CHEST 1 VIEW COMPARISON:  Chest radiograph 03/07/2020 FINDINGS: Borderline enlargement of the cardiopericardial silhouette the lungs appear clear. Mediastinum unremarkable. No significant bony abnormality is identified. IMPRESSION: 1. Borderline enlargement of the cardiopericardial silhouette. The lungs appear clear. Electronically Signed   By: Van Clines M.D.   On: 04/27/2020 20:23    Procedures Procedures (including critical care time)  Medications Ordered in ED Medications - No data to display  ED Course  I have reviewed the triage vital signs and the nursing notes.  Pertinent labs & imaging results that were available during my care of the patient were reviewed by me and considered in my medical decision making (see chart for details).    MDM Rules/Calculators/A&P                          Pt with Covid 19, high risk given DM, asthma immunosuppression, obesity.  No acute respiratory distress today, no wheezing, pulse ox and other vitals stable.  Discussed MAB infusion which pt is agreeable, contacted MAB clinic to reach out to pt.  Home supportive care discussed, strict return precautions outlined.  Also discussed home quarantine requirements for her and family.  The patient appears reasonably screened and/or stabilized for discharge and I doubt any other medical condition or other [facility] requiring further screening, evaluation, or treatment in the ED at this time prior to discharge.  Elizabeth Bautista was evaluated in Emergency Department on 04/28/2020 for  the symptoms described in the history of present illness. She was evaluated in the context of the global COVID-19 pandemic, which necessitated consideration that the patient might be at risk for infection with the SARS-CoV-2 virus that causes COVID-19. Institutional protocols and algorithms that pertain to the evaluation of patients at risk for COVID-19 are in a state of rapid change based on information released by regulatory bodies including the CDC and federal and state organizations. These policies and algorithms were followed during the patient's care in the ED.  Final Clinical Impression(s) / ED Diagnoses Final diagnoses:  [id number]    Rx / DC Orders ED Discharge Orders    None       Landis Martins 04/28/20 1206    Wyvonnia Dusky, MD 04/28/20 1228

## 2020-04-30 ENCOUNTER — Telehealth: Payer: Self-pay

## 2020-04-30 NOTE — Telephone Encounter (Signed)
Transition Care Management Unsuccessful Follow-up Telephone Call  Date of discharge and from where:  04/27/2020 [facility]  Attempts:  1st Attempt  Reason for unsuccessful TCM follow-up call:  Left voice message

## 2020-05-01 NOTE — Telephone Encounter (Signed)
Transition Care Management Unsuccessful Follow-up Telephone Call  Date of discharge and from where:  04/27/2020 [facility]  Attempts:  2nd Attempt  Reason for unsuccessful TCM follow-up call:  Left voice message

## 2020-05-02 ENCOUNTER — Encounter: Payer: Medicaid Other | Admitting: Adult Health

## 2020-05-02 ENCOUNTER — Ambulatory Visit: Payer: Medicaid Other | Admitting: Pulmonary Disease

## 2020-05-02 NOTE — Telephone Encounter (Signed)
Transition Care Management Unsuccessful Follow-up Telephone Call  Date of discharge and from where:  [facility]/27/[facility]  Attempts:  3rd Attempt  Reason for unsuccessful TCM follow-up call:  Left voice message

## 2020-05-03 ENCOUNTER — Encounter: Payer: Medicaid Other | Admitting: Adult Health

## 2020-05-04 ENCOUNTER — Telehealth: Payer: Self-pay | Admitting: General Practice

## 2020-05-04 NOTE — Telephone Encounter (Signed)
First attempt to reach Elizabeth Bautista to schedule her with the Managed Medicaid Pharmacist. I left my name and number for her to return my call. I will make another attempt to reach this patient in the next 7-14 days.

## 2020-05-08 ENCOUNTER — Ambulatory Visit: Payer: Medicaid Other | Admitting: Internal Medicine

## 2020-05-09 ENCOUNTER — Ambulatory Visit: Payer: Medicaid Other | Admitting: Pulmonary Disease

## 2020-05-10 ENCOUNTER — Other Ambulatory Visit: Payer: Self-pay

## 2020-05-10 ENCOUNTER — Emergency Department (HOSPITAL_COMMUNITY)
Admission: EM | Admit: 2020-05-10 | Discharge: 2020-05-10 | Disposition: A | Discharge status: Home / Self Care | Payer: Medicaid Other | Attending: Emergency Medicine | Admitting: Emergency Medicine

## 2020-05-10 ENCOUNTER — Emergency Department (HOSPITAL_COMMUNITY): Payer: Medicaid Other

## 2020-05-10 ENCOUNTER — Telehealth: Payer: Self-pay | Admitting: General Practice

## 2020-05-10 ENCOUNTER — Encounter (HOSPITAL_COMMUNITY): Payer: Self-pay | Admitting: Emergency Medicine

## 2020-05-10 ENCOUNTER — Telehealth: Payer: Self-pay | Admitting: Pulmonary Disease

## 2020-05-10 DIAGNOSIS — Z79899 Other long term (current) drug therapy: Secondary | ICD-10-CM | POA: Insufficient documentation

## 2020-05-10 DIAGNOSIS — R0602 Shortness of breath: Secondary | ICD-10-CM | POA: Diagnosis present

## 2020-05-10 DIAGNOSIS — I10 Essential (primary) hypertension: Secondary | ICD-10-CM | POA: Insufficient documentation

## 2020-05-10 DIAGNOSIS — E1165 Type 2 diabetes mellitus with hyperglycemia: Secondary | ICD-10-CM | POA: Diagnosis not present

## 2020-05-10 DIAGNOSIS — E1169 Type 2 diabetes mellitus with other specified complication: Secondary | ICD-10-CM | POA: Diagnosis not present

## 2020-05-10 DIAGNOSIS — Z87891 Personal history of nicotine dependence: Secondary | ICD-10-CM | POA: Diagnosis not present

## 2020-05-10 DIAGNOSIS — J45901 Unspecified asthma with (acute) exacerbation: Secondary | ICD-10-CM | POA: Insufficient documentation

## 2020-05-10 DIAGNOSIS — Z7951 Long term (current) use of inhaled steroids: Secondary | ICD-10-CM | POA: Diagnosis not present

## 2020-05-10 DIAGNOSIS — R0600 Dyspnea, unspecified: Secondary | ICD-10-CM

## 2020-05-10 LAB — COMPREHENSIVE METABOLIC PANEL
ALT: 18 U/L (ref 0–44)
AST: 15 U/L (ref 15–41)
Albumin: 3.5 g/dL (ref 3.5–5.0)
Alkaline Phosphatase: 62 U/L (ref 38–126)
Anion gap: 8 (ref 5–15)
BUN: 8 mg/dL (ref 6–20)
CO2: 25 mmol/L (ref 22–32)
Calcium: 8.2 mg/dL — ABNORMAL LOW (ref 8.9–10.3)
Chloride: 103 mmol/L (ref 98–111)
Creatinine, Ser: 0.52 mg/dL (ref 0.44–1.00)
GFR, Estimated: >60 mL/min (ref >60)
Glucose, Bld: 91 mg/dL (ref 70–99)
Potassium: 3.5 mmol/L (ref 3.5–5.1)
Sodium: 136 mmol/L (ref 135–145)
Total Bilirubin: 0.6 mg/dL (ref 0.3–1.2)
Total Protein: 6.7 g/dL (ref 6.5–8.1)

## 2020-05-10 LAB — CBC WITH DIFFERENTIAL/PLATELET
Abs Immature Granulocytes: 0.05 10*3/uL (ref 0.00–0.07)
Basophils Absolute: 0 10*3/uL (ref 0.0–0.1)
Basophils Relative: 0 %
Eosinophils Absolute: 0.4 10*3/uL (ref 0.0–0.5)
Eosinophils Relative: 4 %
HCT: 36.4 % (ref 36.0–46.0)
Hemoglobin: 10.8 g/dL — ABNORMAL LOW (ref 12.0–15.0)
Immature Granulocytes: 1 %
Lymphocytes Relative: 26 %
Lymphs Abs: 2.6 10*3/uL (ref 0.7–4.0)
MCH: 21.8 pg — ABNORMAL LOW (ref 26.0–34.0)
MCHC: 29.7 g/dL — ABNORMAL LOW (ref 30.0–36.0)
MCV: 73.4 fL — ABNORMAL LOW (ref 80.0–100.0)
Monocytes Absolute: 0.7 10*3/uL (ref 0.1–1.0)
Monocytes Relative: 6 %
Neutro Abs: 6.6 10*3/uL (ref 1.7–7.7)
Neutrophils Relative %: 63 %
Platelets: 220 10*3/uL (ref 150–400)
RBC: 4.96 MIL/uL (ref 3.87–5.11)
RDW: 21.1 % — ABNORMAL HIGH (ref 11.5–15.5)
WBC: 10.3 10*3/uL (ref 4.0–10.5)
nRBC: 0 % (ref 0.0–0.2)

## 2020-05-10 MED ORDER — ONDANSETRON HCL 4 MG/2ML IJ SOLN
4.0000 mg | Freq: Once | INTRAMUSCULAR | Status: AC
  Administered 2020-05-10: 4 mg via INTRAVENOUS
  Filled 2020-05-10: qty 2

## 2020-05-10 MED ORDER — IOHEXOL 350 MG/ML SOLN
100.0000 mL | Freq: Once | INTRAVENOUS | Status: AC | PRN
  Administered 2020-05-10: 100 mL via INTRAVENOUS

## 2020-05-10 NOTE — Telephone Encounter (Signed)
Pt called states she had a missed call sometime from our ph# [phone] [facility]. No note documented.  Pt states she has been unable to talk so she has not answered calls. Pt very concerned with breathing. Pt voice is weak. Per previous note in chart, advised pt best option for immediate care for oxygen and treatments is to go to the ED today. Pt verbalized understanding. Discussed appt [facility] but pt pt will be better served with immediate care at an ED.  Pt states she will go to the ED today.

## 2020-05-10 NOTE — Telephone Encounter (Signed)
05/10/20  04/27/20 - SarsCov2 - positive   Reach patient over the phone.  She tested positive for COVID-19 on 04/27/2020  During telephone call patient is audibly struggling to breathe.  She is unable to check her oxygen levels.  When asking questions about her symptoms she reports that she does have wheezing, cough, increased shortness of breath and chest pain.  Patient is requesting prednisone.  I discussed with the patient that my recommendations would be that she seek emergent evaluation at an emergency room or urgent care.  Preference being in the emergency room given her COVID-19 diagnosis as well as acute symptoms such as chest pain or shortness of breath.  Patient reported that a nurse told her that she needed to have oxygen started at night.  She is unsure who this nurse is who made these recommendations.  I see multiple attempts from transition of care management as well as the monoclonal antibody team.  Unfortunately they were unable to reach the patient.  I am unsure who provided these recommendations.  I explained to the patient that I do agree that she needs pulmonary outpatient follow-up but the first step would be a in person evaluation in emergency room given the acute nature of her symptoms.  Patient was frustrated regarding these recommendations.  She did not feel that she needed to be seen in the emergency room.  When I tried to further explain why her recommendations were for emergency room eval patient hung up the phone.  We will route to Dr. Craige Cotta as Lorain Childes.  Will route to Roney Mans, RN to try to attempt to contact the patient to schedule a follow-up with our office.  Elisha Headland, FNP

## 2020-05-10 NOTE — ED Notes (Signed)
ED Provider at bedside. 

## 2020-05-10 NOTE — ED Notes (Signed)
Walking tria 75 ft l with continuous pulse ox monitor reveals O2 sats of 99-100% with one drop to 95%.  HR maintained 80-95 bpm. Pt stopped to rest 3 times. No distress noted.

## 2020-05-10 NOTE — ED Triage Notes (Signed)
Pt states she tested positive for covid 11/26. Pt c/o SOB that started 11/28 and weakness.

## 2020-05-10 NOTE — ED Notes (Addendum)
Pt states "I am not waiting on discharge paper work" and is refusing to sign out AMA. Dr. Rubin Payor made aware.

## 2020-05-10 NOTE — ED Notes (Signed)
Pt states "I want this IV out now"

## 2020-05-10 NOTE — ED Provider Notes (Signed)
[facility] EMERGENCY DEPARTMENT Provider Note   CSN: [id number] Arrival date & time: 05/10/20  1244     History Chief Complaint  Patient presents with  . Shortness of Breath    Elizabeth Bautista is a 44 y.o. female.  HPI Patient presents with shortness of breath.  History of Covid infection.  Diagnosed on October 26.  States she had symptoms for 3 days before that.  States on the 28th she started to have more weakness.  States she had been doing better but then on Monday this week with today being Friday became more short of breath.  States she is having difficulty walking around.  States she cannot lay flat.  Is coughing with a little bit of sputum production.  No fevers.  No swelling in her legs.  She is supposed be wearing CPAP at night but has not been able to do it.    Past Medical History:  Diagnosis Date  . Anemia   . Asthma   . Diabetes ([place])    on Metformin  . Edema   . 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 HTN (hypertension)   . Rheumatoid arthritis ([place])    on Plaquenil   . Seizures ([place])    last seizure 2019  . Sleep apnea   . Sleep apnea     Patient Active Problem List   Diagnosis Date Noted  . Acute asthma exacerbation 03/08/2020  . GERD (gastroesophageal reflux disease) 03/07/2020  . Anxiety 03/07/2020  . History of seizures 03/07/2020  . Hypokalemia 03/07/2020  . Upper airway cough syndrome 02/10/2020  . Swelling of lower extremity 02/01/2020  . Hypertension 02/01/2020  . Vitamin D deficiency 02/01/2020  . Type 2 diabetes mellitus with hyperglycemia, without long-term current use of insulin ([place]) 02/01/2020  . Anemia 02/01/2020  . Class 3 severe obesity with serious comorbidity and body mass index (BMI) of 45.0 to 49.9 in adult [facility]) 10/22/2019  . Asthma exacerbation 10/18/2019  . OSA on CPAP 10/18/2019  . Depression 10/18/2019  . Rheumatoid arthritis ([place])  10/18/2019    Past Surgical History:  Procedure Laterality Date  . APPENDECTOMY    . CESAREAN SECTION     x3     OB History   No obstetric history on file.     Family History  Problem Relation Age of Onset  . High blood pressure Mother   . Diabetes Father   . Diabetes Sister   . Heart Problems Sister   . Diabetes Brother   . Diabetes Paternal Grandmother   . Diabetes Other        father's side "everybody died from Diabetes"  . High blood pressure Other        mother's side, multiple siblings with this   . Heart attack Other        family member on mother's side   . Diabetes Paternal Aunt   . Seizures Cousin        not sibings to the other cousins with seizures  . Seizures Cousin        not sibings to the other cousins with seizures  . Seizures Cousin        not sibings to the other cousins with seizures  . Cervical cancer Maternal Aunt   . Breast cancer Cousin   . Dementia Maternal Aunt   . Asthma Maternal Aunt   . Heart  Problems Maternal Grandmother   . Asthma Child   . Asthma Child     Social History   Tobacco Use  . Smoking status: Former Smoker    Years: 10.00    Types: Cigarettes    Quit date: 06/02/2014    Years since quitting: 5.9  . Smokeless tobacco: Never Used  . Tobacco comment: during the 10 years of smoking, smoked 2-3 cigarettes/day  Vaping Use  . Vaping Use: Never used  Substance Use Topics  . Alcohol use: Not Currently  . Drug use: Not Currently    Home Medications Prior to Admission medications   Medication Sig Start Date End Date Taking? Authorizing Provider  albuterol (PROAIR HFA) 108 (90 Base) MCG/ACT inhaler 2 puffs every 4 hours as needed only  if your can't catch your breath 02/09/20   Tanda Rockers, MD  amLODipine (NORVASC) 5 MG tablet Take 1 tablet (5 mg total) by mouth daily. 03/22/20   Doree Albee, MD  blood glucose meter kit and supplies Dispense based on patient and insurance preference. Use up to four times daily as  directed. (FOR ICD-10 E10.9, E11.9). 02/01/20   Ailene Ards, NP  Blood Pressure Monitoring [facility]) MISC 1 each by Does not apply route daily. 01/18/20   Ailene Ards, NP  busPIRone (BUSPAR) 7.5 MG tablet Take 7.5 mg by mouth 2 (two) times daily. 09/05/19   [provider]  Cholecalciferol 1.25 MG (50000 UT) TABS Take 1 tablet by mouth daily. 02/01/20   Ailene Ards, NP  cyclobenzaprine (FLEXERIL) 10 MG tablet Take 10 mg by mouth 3 (three) times daily as needed for muscle spasms.  08/27/19   [provider]  escitalopram (LEXAPRO) 10 MG tablet Take 1 tablet (10 mg total) by mouth daily. 03/13/20 04/27/20  Manuella Ghazi, Pratik D, DO  escitalopram (LEXAPRO) 20 MG tablet Take 1 tablet by mouth every evening. 02/29/20   [provider]  famotidine (PEPCID) 20 MG tablet Take 20 mg by mouth daily. 07/31/19   [provider]  ferrous sulfate 325 (65 FE) MG tablet Take 325 mg by mouth 2 (two) times daily. 05/17/19   [provider]  fluticasone (CUTIVATE) 0.005 % ointment Apply 1 application topically daily. 10/11/19   [provider]  fluticasone (FLONASE) 50 MCG/ACT nasal spray Place 1 spray into both nostrils daily. 04/04/20   Chesley Mires, MD  furosemide (LASIX) 20 MG tablet Take 20 mg by mouth daily. 03/09/20   [provider]  gabapentin (NEURONTIN) 100 MG capsule Take 1 capsule (100 mg total) by mouth 3 (three) times daily. 02/27/20   Melvenia Beam, MD  hydrochlorothiazide (HYDRODIURIL) 25 MG tablet Take 1 tablet (25 mg total) by mouth daily. For high blood pressure. 02/01/20   Ailene Ards, NP  hydroxychloroquine (PLAQUENIL) 200 MG tablet Take 1 tablet (200 mg total) by mouth 2 (two) times daily. 02/20/20   Ailene Ards, NP  hydrOXYzine (ATARAX/VISTARIL) 25 MG tablet Take 25 mg by mouth 2 (two) times daily. 02/01/20   [provider]  ibuprofen (ADVIL) 800 MG tablet Take 1 tablet (800 mg total) by mouth every 8 (eight) hours as needed.  02/20/20   Ailene Ards, NP  KLOR-CON M20 20 MEQ tablet TAKE 1 TABLET BY MOUTH DAILY ONLY WHEN YOU TAKE FUROSEMIDE. Patient taking differently: 20 mEq daily. Only when you take Furosemide. 03/05/20   Doree Albee, MD  levETIRAcetam (KEPPRA) 500 MG tablet Take 1 tablet (500 mg  total) by mouth 2 (two) times daily. 03/13/20 04/12/20  Manuella Ghazi, Pratik D, DO  metoprolol tartrate (LOPRESSOR) 50 MG tablet TAKE 1 TABLET 2 HR PRIOR TO CARDIAC PROCEDURE 04/16/20   Buford Dresser, MD  mirtazapine (REMERON) 15 MG tablet Take 15 mg by mouth at bedtime as needed. 02/29/20   [provider]  mirtazapine (REMERON) 30 MG tablet Take 30 mg by mouth at bedtime. 03/30/20   [provider]  montelukast (SINGULAIR) 10 MG tablet Take 1 tablet (10 mg total) by mouth at bedtime. 03/13/20 04/12/20  Manuella Ghazi, Pratik D, DO  pantoprazole (PROTONIX) 40 MG tablet Take 1 tablet (40 mg total) by mouth daily. Take 30-60 min before first meal of the day 02/09/20   Tanda Rockers, MD  prazosin (MINIPRESS) 1 MG capsule Take 1 mg by mouth at bedtime. 02/29/20   [provider]  predniSONE (DELTASONE) 10 MG tablet Take 10 mg by mouth 2 (two) times daily with a meal.  04/10/20   [provider]  QUEtiapine (SEROQUEL) 25 MG tablet Take 25 mg by mouth 2 (two) times daily.  09/05/19   [provider]  SYMBICORT 160-4.5 MCG/ACT inhaler Inhale 1 puff into the lungs in the morning and at bedtime. 01/19/20   [provider]    Allergies    Phenytoin and Phenylbutazones  Review of Systems   Review of Systems  Constitutional: Positive for fatigue. Negative for appetite change.  HENT: Negative for congestion.   Respiratory: Positive for cough and shortness of breath.   Cardiovascular: Negative for chest pain and leg swelling.  Gastrointestinal: Negative for abdominal pain.  Genitourinary: Negative for flank pain.  Musculoskeletal: Negative for back pain.  Skin: Negative for rash.   Neurological: Negative for weakness.  Psychiatric/Behavioral: Negative for confusion.    Physical Exam Updated Vital Signs BP (!) 145/101   Pulse 70   Temp 98.5 F (36.9 C) (Oral)   Resp 17   Ht 5' 5"  (1.651 m)   Wt 127 kg   LMP 04/26/2020   SpO2 100%   BMI 46.59 kg/m   Physical Exam Nursing note reviewed.  Constitutional:      Appearance: She is well-developed.  HENT:     Head: Atraumatic.  Cardiovascular:     Rate and Rhythm: Regular rhythm.  Pulmonary:     Breath sounds: No wheezing, rhonchi or rales.  Chest:     Chest wall: No tenderness.  Abdominal:     Tenderness: There is no abdominal tenderness.  Musculoskeletal:     Cervical back: Neck supple.     Right lower leg: No edema.     Left lower leg: No edema.  Skin:    General: Skin is warm.     Capillary Refill: Capillary refill takes less than 2 seconds.  Neurological:     Mental Status: She is alert and oriented to person, place, and time.     ED Results / Procedures / Treatments   Labs (all labs ordered are listed, but only abnormal results are displayed) Labs Reviewed  COMPREHENSIVE METABOLIC PANEL - Abnormal; Notable for the following components:      Result Value   Calcium 8.2 (*)    All other components within normal limits  CBC WITH DIFFERENTIAL/PLATELET - Abnormal; Notable for the following components:   Hemoglobin 10.8 (*)    MCV 73.4 (*)    MCH 21.8 (*)    MCHC 29.7 (*)    RDW 21.1 (*)    All  other components within normal limits    EKG EKG Interpretation  Date/Time:  Thursday May 10 2020 13:24:12 EST Ventricular Rate:  71 PR Interval:  152 QRS Duration: 76 QT Interval:  384 QTC Calculation: 417 R Axis:   69 Text Interpretation: Normal sinus rhythm Normal ECG Confirmed by Davonna Belling [phone]) on 05/10/2020 4:44:19 PM   Radiology DG Chest Port 1 View  Result Date: 05/10/2020 CLINICAL DATA:  sob EXAM: PORTABLE CHEST 1 VIEW COMPARISON:  04/27/2020 and prior. FINDINGS:  No focal consolidation, pneumothorax or pleural effusion. Prominence of the cardiac silhouette, unchanged. No acute osseous abnormality. IMPRESSION: No focal consolidation. Prominent cardiac silhouette, unchanged. Electronically Signed   By: Primitivo Gauze M.D.   On: 05/10/2020 14:01    Procedures Procedures (including critical care time)  Medications Ordered in ED Medications  iohexol (OMNIPAQUE) 350 MG/ML injection 100 mL (has no administration in time range)  ondansetron (ZOFRAN) injection 4 mg (4 mg Intravenous Given 05/10/20 1755)    ED Course  I have reviewed the triage vital signs and the nursing notes.  Pertinent labs & imaging results that were available during my care of the patient were reviewed by me and considered in my medical decision making (see chart for details).    MDM Rules/Calculators/A&P                          Patient presents with shortness of breath.  Did have recent Covid.  Also has underlying COPD/asthma.  States she was sent in by her pulmonologist.  States she is having issues with her CPAP and also needs oxygen at night.  States she is more fatigued unable to walk or do the things she normally be able to do.  Some chest pain with it to.  States it does not feel like her asthma.  CT scan done to evaluate for pulmonary embolism and reassuring.  Does have some chronic changes and likely some changes secondary to Covid.  Ambulated in the ER and not hypoxic but had to take breaks while doing it.  Patient was upset that we are not able to fix her CPAP machine.  Was having respiratory therapy come down to see if he could potentially find a replacement for her mask.  However patient was not willing to stay for this.  Would not wait for discharge paperwork.  Left for home and had previously been informed she could follow with her pulmonologist Final Clinical Impression(s) / ED Diagnoses Final diagnoses:  SOB (shortness of breath)    Rx / DC Orders ED Discharge  Orders    None       Davonna Belling, MD 05/10/20 2320

## 2020-05-11 NOTE — Telephone Encounter (Signed)
Called and spoke with pt who stated that she did go to the ED yesterday. Pt said that she still does not know why she was told to go to the ED when she called the office yesterday 12/9 as she was just wanting to have an appt scheduled due to having increased SOB. Stated to pt the reason why Arlys John, NP told her to go to the ED was due to her symptoms as she was needing to have an emergent evaluation in the ED. Stated to pt that the person who she spoke on the phone with yesterday 12/9 was one of our providers and stated to her that he was giving her his recommendations for what needed to happen for her to be able to get care the soonest.  Since pt did go to the ED, pt is now wanting to have an appt scheduled at the office for further evaluation at the office. I have scheduled pt an appt with [facility] 05/16/20. Told pt that she needed to arrive early to have a cxr performed and pt said that when she went to the ED, she had a cxr and a CT performed at the ED. These imaging is able to be seen in pt's chart. Nothing further needed.

## 2020-05-11 NOTE — Telephone Encounter (Signed)
Patient went to emergency department 05/10/2020. Would like an appointment. Patient phone number is [phone].

## 2020-05-16 ENCOUNTER — Encounter: Payer: Self-pay | Admitting: Primary Care

## 2020-05-16 ENCOUNTER — Other Ambulatory Visit: Payer: Self-pay

## 2020-05-16 ENCOUNTER — Ambulatory Visit (INDEPENDENT_AMBULATORY_CARE_PROVIDER_SITE_OTHER): Payer: Medicaid Other | Admitting: Primary Care

## 2020-05-16 DIAGNOSIS — Z9989 Dependence on other enabling machines and devices: Secondary | ICD-10-CM | POA: Diagnosis not present

## 2020-05-16 DIAGNOSIS — U071 COVID-19: Secondary | ICD-10-CM

## 2020-05-16 DIAGNOSIS — J4521 Mild intermittent asthma with (acute) exacerbation: Secondary | ICD-10-CM | POA: Diagnosis not present

## 2020-05-16 DIAGNOSIS — G4733 Obstructive sleep apnea (adult) (pediatric): Secondary | ICD-10-CM | POA: Diagnosis not present

## 2020-05-16 HISTORY — DX: COVID-19: U07.1

## 2020-05-16 MED ORDER — BREZTRI AEROSPHERE 160-9-4.8 MCG/ACT IN AERO: 2.0000 | INHALATION_SPRAY | Freq: Two times a day (BID) | RESPIRATORY_TRACT | 0 refills | Status: DC

## 2020-05-16 NOTE — Progress Notes (Signed)
 @Patient ID: Elizabeth Bautista, female    DOB: 03/31/1976, 44 y.o.   MRN: [id number]  Chief Complaint  Patient presents with  . Acute Visit    Referring provider: No ref. provider found  HPI: 44-year-old female, former smoker quit in 2017 (ten-pack-year history).  Past medical history significant for OSA on CPAP, asthma, hypertension, GERD, anemia, seizures, type 2 diabetes, rheumatoid arthritis, class III obesity.  Patient of Dr.Wert, last seen on 04/02/20. She had sleep evaluation with Dr. Sood on 04/04/20. Maintained on Symbicort, Singulair and plaquenil.   05/16/2020 - Interim hx Patient presents today for acute visit with complaints of shortness of breath wheezing.  She tested positive for Covid on 04/27/2020. She ran out of Stiolto > 2 weeks ago and came off prednisone recently. She is consistently short of breath since getting COVID. She denies wheezing or chest tightness. She has a productive cough with clear thick sputum. Waking up gasping for air. Hx sleep apnea, she has a CPAP at home but does not have supplies. She had to cancel split-night sleep study d/t covid, repeat sleep study scheduled for end of December.    Testing:  Spirometry 08/18/17 >> FEV1 2.03 (82%), FEV1% 73   CT sinus 10/20/19 >> normal   Echo 01/24/20 >> EF 55 to 60%, grade 1 DD, mod LA dilation   CTA 05/10/20 negative for PE; Mild airway thickening and faint patchy GGO in the lungs  Allergies  Allergen Reactions  . Phenytoin Anaphylaxis    Other reaction(s): swelling and heart rate decreased  . Phenylbutazones      There is no immunization history on file for this patient.  Past Medical History:  Diagnosis Date  . Anemia   . Asthma   . Diabetes (HCC)    on Metformin  . Edema   . 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 HTN (hypertension)   . Rheumatoid arthritis (HCC)    on Plaquenil   . Seizures (HCC)    last  seizure 2019  . Sleep apnea   . Sleep apnea     Tobacco History: Social History   Tobacco Use  Smoking Status Former Smoker  . Years: 10.00  . Types: Cigarettes  . Quit date: 06/02/2014  . Years since quitting: 5.9  Smokeless Tobacco Never Used  Tobacco Comment   during the 10 years of smoking, smoked 2-3 cigarettes/day   Counseling given: Not Answered Comment: during the 10 years of smoking, smoked 2-3 cigarettes/day   Outpatient Medications Prior to Visit  Medication Sig Dispense Refill  . albuterol (PROAIR HFA) 108 (90 Base) MCG/ACT inhaler 2 puffs every 4 hours as needed only  if your can't catch your breath 18 g 11  . amLODipine (NORVASC) 5 MG tablet Take 1 tablet (5 mg total) by mouth daily. 30 tablet 3  . blood glucose meter kit and supplies Dispense based on patient and insurance preference. Use up to four times daily as directed. (FOR ICD-10 E10.9, E11.9). 1 each 0  . Blood Pressure Monitoring (SPHYGMOMANOMETER) MISC 1 each by Does not apply route daily. 1 each 0  . busPIRone (BUSPAR) 7.5 MG tablet Take 7.5 mg by mouth 2 (two) times daily.    . Cholecalciferol 1.25 MG (50000 UT) TABS Take 1 tablet by mouth daily. 90 tablet 0  . cyclobenzaprine (FLEXERIL) 10 MG tablet Take 10 mg by mouth 3 (three) times daily   as needed for muscle spasms.     . escitalopram (LEXAPRO) 20 MG tablet Take 1 tablet by mouth every evening.    . famotidine (PEPCID) 20 MG tablet Take 20 mg by mouth daily.    . ferrous sulfate 325 (65 FE) MG tablet Take 325 mg by mouth 2 (two) times daily.    . fluticasone (CUTIVATE) 0.005 % ointment Apply 1 application topically daily.    . fluticasone (FLONASE) 50 MCG/ACT nasal spray Place 1 spray into both nostrils daily. 16 g 5  . furosemide (LASIX) 20 MG tablet Take 20 mg by mouth daily.    . gabapentin (NEURONTIN) 100 MG capsule Take 1 capsule (100 mg total) by mouth 3 (three) times daily. 90 capsule 3  . hydrochlorothiazide (HYDRODIURIL) 25 MG tablet Take 1  tablet (25 mg total) by mouth daily. For high blood pressure. 90 tablet 0  . hydroxychloroquine (PLAQUENIL) 200 MG tablet Take 1 tablet (200 mg total) by mouth 2 (two) times daily. 60 tablet 1  . hydrOXYzine (ATARAX/VISTARIL) 25 MG tablet Take 25 mg by mouth 2 (two) times daily.    . ibuprofen (ADVIL) 800 MG tablet Take 1 tablet (800 mg total) by mouth every 8 (eight) hours as needed. 30 tablet 0  . KLOR-CON M20 20 MEQ tablet TAKE 1 TABLET BY MOUTH DAILY ONLY WHEN YOU TAKE FUROSEMIDE. (Patient taking differently: 20 mEq daily. Only when you take Furosemide.) 30 tablet 3  . metoprolol tartrate (LOPRESSOR) 50 MG tablet TAKE 1 TABLET 2 HR PRIOR TO CARDIAC PROCEDURE 1 tablet 0  . mirtazapine (REMERON) 15 MG tablet Take 15 mg by mouth at bedtime as needed.    . mirtazapine (REMERON) 30 MG tablet Take 30 mg by mouth at bedtime.    . pantoprazole (PROTONIX) 40 MG tablet Take 1 tablet (40 mg total) by mouth daily. Take 30-60 min before first meal of the day 30 tablet 2  . prazosin (MINIPRESS) 1 MG capsule Take 1 mg by mouth at bedtime.    . predniSONE (DELTASONE) 10 MG tablet Take 10 mg by mouth 2 (two) times daily with a meal.     . QUEtiapine (SEROQUEL) 25 MG tablet Take 25 mg by mouth 2 (two) times daily.     . SYMBICORT 160-4.5 MCG/ACT inhaler Inhale 1 puff into the lungs in the morning and at bedtime.    . escitalopram (LEXAPRO) 10 MG tablet Take 1 tablet (10 mg total) by mouth daily. 30 tablet 2  . levETIRAcetam (KEPPRA) 500 MG tablet Take 1 tablet (500 mg total) by mouth 2 (two) times daily. 60 tablet 2  . montelukast (SINGULAIR) 10 MG tablet Take 1 tablet (10 mg total) by mouth at bedtime. 30 tablet 0   No facility-administered medications prior to visit.    Review of Systems  Review of Systems  Constitutional: Negative.   Respiratory: Positive for cough and shortness of breath. Negative for chest tightness and wheezing.     Physical Exam  BP 138/90 (BP Location: Left Arm, Cuff Size:  Normal)   Pulse 82   Temp 98.6 F (37 C) (Oral)   Ht 5' 5" (1.651 m)   Wt 287 lb 9.6 oz (130.5 kg)   LMP 04/26/2020   SpO2 100%   BMI 47.86 kg/m  Physical Exam Constitutional:      Appearance: Normal appearance.  HENT:     Head: Normocephalic and atraumatic.     Mouth/Throat:     Mouth: Mucous membranes are moist.       Pharynx: Oropharynx is clear.  Cardiovascular:     Rate and Rhythm: Normal rate and regular rhythm.  Pulmonary:     Effort: Pulmonary effort is normal.     Breath sounds: Normal breath sounds. No wheezing, rhonchi or rales.  Musculoskeletal:     Comments: Amb with rolling walker, MAEW  Skin:    General: Skin is warm and dry.  Neurological:     General: No focal deficit present.     Mental Status: She is alert and oriented to person, place, and time. Mental status is at baseline.  Psychiatric:        Mood and Affect: Mood normal.        Behavior: Behavior normal.        Thought Content: Thought content normal.        Judgment: Judgment normal.      Lab Results:  CBC    Component Value Date/Time   WBC 10.3 05/10/2020 1730   RBC 4.96 05/10/2020 1730   HGB 10.8 (L) 05/10/2020 1730   HGB 12.9 02/16/2020 1600   HCT 36.4 05/10/2020 1730   HCT 41.9 02/16/2020 1600   PLT 220 05/10/2020 1730   PLT 329 02/16/2020 1600   MCV 73.4 (L) 05/10/2020 1730   MCV 72 (L) 02/16/2020 1600   MCH 21.8 (L) 05/10/2020 1730   MCHC 29.7 (L) 05/10/2020 1730   RDW 21.1 (H) 05/10/2020 1730   RDW 17.7 (H) 02/16/2020 1600   LYMPHSABS 2.6 05/10/2020 1730   LYMPHSABS 2.5 02/16/2020 1600   MONOABS 0.7 05/10/2020 1730   EOSABS 0.4 05/10/2020 1730   EOSABS 0.5 (H) 02/16/2020 1600   BASOSABS 0.0 05/10/2020 1730   BASOSABS 0.1 02/16/2020 1600    BMET    Component Value Date/Time   NA 136 05/10/2020 1730   NA 142 04/16/2020 1153   K 3.5 05/10/2020 1730   CL 103 05/10/2020 1730   CO2 25 05/10/2020 1730   GLUCOSE 91 05/10/2020 1730   BUN 8 05/10/2020 1730   BUN 7  04/16/2020 1153   CREATININE 0.52 05/10/2020 1730   CREATININE 0.71 02/23/2020 0941   CALCIUM 8.2 (L) 05/10/2020 1730   GFRNONAA >60 05/10/2020 1730   GFRNONAA 103 02/23/2020 0941   GFRAA 121 04/16/2020 1153   GFRAA 119 02/23/2020 0941    BNP No results found for: BNP  ProBNP No results found for: PROBNP  Imaging: CT Angio Chest PE W and/or Wo Contrast  Result Date: 05/10/2020 CLINICAL DATA:  Shortness of breath. Positive COVID test on 04/27/2020. History of asthma. EXAM: CT ANGIOGRAPHY CHEST WITH CONTRAST TECHNIQUE: Multidetector CT imaging of the chest was performed using the standard protocol during bolus administration of intravenous contrast. Multiplanar CT image reconstructions and MIPs were obtained to evaluate the vascular anatomy. CONTRAST:  100mL OMNIPAQUE IOHEXOL 350 MG/ML SOLN COMPARISON:  Chest radiograph 05/10/2020 FINDINGS: Cardiovascular: Contrast bolus timing suboptimal but adequate, there is substantial systemic arterial opacification. No filling defect is identified in the pulmonary arterial tree to suggest pulmonary embolus. Mild cardiomegaly. Mediastinum/Nodes: Unremarkable Lungs/Pleura: Mild airway thickening which may be from bronchitis or reactive airways disease. There is some faint patchy ground-glass opacities in the lungs, for example medially in the right upper lobe on image 30 of series 6, and posteromedially in the right lower lobe on image 76 of series 6 as well as in the lingula, which are nonspecific but could reflect residual manifestations from recent COVID pneumonia given the clinical context. Upper Abdomen: Unremarkable Musculoskeletal: Unremarkable Review   of the MIP images confirms the above findings. IMPRESSION: 1. No filling defect is identified in the pulmonary arterial tree to suggest pulmonary embolus. 2. Mild airway thickening may be from bronchitis or reactive airways disease. 3. There is some faint patchy ground-glass opacities in the lungs, which are  nonspecific but could reflect residual manifestations from recent COVID pneumonia given the clinical context. 4. Mild cardiomegaly. Electronically Signed   By: Van Clines M.D.   On: 05/10/2020 19:42   DG Chest Port 1 View  Result Date: 05/10/2020 CLINICAL DATA:  sob EXAM: PORTABLE CHEST 1 VIEW COMPARISON:  04/27/2020 and prior. FINDINGS: No focal consolidation, pneumothorax or pleural effusion. Prominence of the cardiac silhouette, unchanged. No acute osseous abnormality. IMPRESSION: No focal consolidation. Prominent cardiac silhouette, unchanged. Electronically Signed   By: Primitivo Gauze M.D.   On: 05/10/2020 14:01   DG Chest Portable 1 View  Result Date: 04/27/2020 CLINICAL DATA:  Weakness, cough, COVID-19 test positive. EXAM: PORTABLE CHEST 1 VIEW COMPARISON:  Chest radiograph 03/07/2020 FINDINGS: Borderline enlargement of the cardiopericardial silhouette the lungs appear clear. Mediastinum unremarkable. No significant bony abnormality is identified. IMPRESSION: 1. Borderline enlargement of the cardiopericardial silhouette. The lungs appear clear. Electronically Signed   By: Van Clines M.D.   On: 04/27/2020 20:23     Assessment & Plan:   COVID-19 virus infection - Increased dyspnea since covid-19 infection on 04/27/20  - CTA 05/10/20 negative for PE; Mild airway thickening and faint patchy GGO in the lungs - Ambulatory walk did not show any oxygen desaturations  - Sending in Rx for azithromycin for bronchitis and advised patient to start taking Mucinex 681m twice daily   Asthma exacerbation - She was previously on Symbicort and most recently on Stiolto which she ran out of 2 weeks ago. Patient reports increased shortness of breath since COVID-19 infection. She is reluctant to restart oral steriods d/t weight gain. Recommend adding ICS component to asthma regimen. - Plan trial Breztri two puffs twice daily  - FU in 2-4 weeks   OSA on CPAP - Hx sleep apnea, sleep  study not available. Reports waking up gasping for air. She has cpap machine but needs supplies. She had to rescheduled split night sleep study d/t covid-19 infection. Gave patient a sample of medium full face mask today so she could resume use until study is completed.    EMartyn Ehrich NP 05/16/2020

## 2020-05-16 NOTE — Assessment & Plan Note (Signed)
-   Hx sleep apnea, sleep study not available. Reports waking up gasping for air. She has cpap machine but needs supplies. She had to rescheduled split night sleep study d/t covid-19 infection. Gave patient a sample of medium full face mask today so she could resume use until study is completed.

## 2020-05-16 NOTE — Patient Instructions (Addendum)
Recommendations: - Start inhaler called Breztri- take two puffs morning and evening (rinse mouth after use) - Start Mucinex 600mg  twice a day  - Use albuterol rescue inhaler or nebulizer every 6 hours for shortness of breath  Rx: - Zpack as prescribed for bronchitis symptoms   Follow-up: - 2-4 week office or televisit with Dr. 

## 2020-05-16 NOTE — Assessment & Plan Note (Addendum)
-   She was previously on Symbicort and most recently on Stiolto which she ran out of 2 weeks ago. Patient reports increased shortness of breath since COVID-19 infection. She is reluctant to restart oral steriods d/t weight gain. Recommend adding ICS component to asthma regimen. - Plan trial Breztri two puffs twice daily  - FU in 2-4 weeks

## 2020-05-16 NOTE — Assessment & Plan Note (Addendum)
-   Increased dyspnea since covid-19 infection on 04/27/20  - CTA 05/10/20 negative for PE; Mild airway thickening and faint patchy GGO in the lungs - Ambulatory walk did not show any oxygen desaturations  - Sending in Rx for azithromycin for bronchitis and advised patient to start taking Mucinex 600mg  twice daily

## 2020-05-17 MED ORDER — AZITHROMYCIN 250 MG PO TABS: ORAL_TABLET | ORAL | 0 refills | Status: DC

## 2020-05-17 NOTE — Addendum Note (Signed)
Addended by: Glenford Bayley on: 05/17/2020 08:57 AM   Modules accepted: Orders

## 2020-05-23 ENCOUNTER — Ambulatory Visit: Payer: Medicaid Other | Attending: Internal Medicine | Admitting: Pulmonary Disease

## 2020-05-23 ENCOUNTER — Other Ambulatory Visit: Payer: Self-pay

## 2020-05-23 DIAGNOSIS — G4730 Sleep apnea, unspecified: Secondary | ICD-10-CM

## 2020-05-23 DIAGNOSIS — G4733 Obstructive sleep apnea (adult) (pediatric): Secondary | ICD-10-CM | POA: Insufficient documentation

## 2020-05-24 ENCOUNTER — Ambulatory Visit (INDEPENDENT_AMBULATORY_CARE_PROVIDER_SITE_OTHER): Payer: Medicaid Other | Admitting: Internal Medicine

## 2020-05-24 ENCOUNTER — Encounter: Payer: Self-pay | Admitting: Internal Medicine

## 2020-05-24 VITALS — BP 116/63 | HR 87 | Temp 98.1°F | Resp 18 | Ht 65.0 in | Wt 281.1 lb

## 2020-05-24 DIAGNOSIS — F419 Anxiety disorder, unspecified: Secondary | ICD-10-CM

## 2020-05-24 DIAGNOSIS — Z2821 Immunization not carried out because of patient refusal: Secondary | ICD-10-CM

## 2020-05-24 DIAGNOSIS — E66813 Obesity, class 3: Secondary | ICD-10-CM

## 2020-05-24 DIAGNOSIS — K219 Gastro-esophageal reflux disease without esophagitis: Secondary | ICD-10-CM | POA: Diagnosis not present

## 2020-05-24 DIAGNOSIS — J4540 Moderate persistent asthma, uncomplicated: Secondary | ICD-10-CM

## 2020-05-24 DIAGNOSIS — R5381 Other malaise: Secondary | ICD-10-CM

## 2020-05-24 DIAGNOSIS — Z7689 Persons encountering health services in other specified circumstances: Secondary | ICD-10-CM

## 2020-05-24 DIAGNOSIS — M069 Rheumatoid arthritis, unspecified: Secondary | ICD-10-CM | POA: Diagnosis not present

## 2020-05-24 DIAGNOSIS — M545 Low back pain, unspecified: Secondary | ICD-10-CM

## 2020-05-24 DIAGNOSIS — Z6841 Body Mass Index (BMI) 40.0 and over, adult: Secondary | ICD-10-CM

## 2020-05-24 DIAGNOSIS — Z9989 Dependence on other enabling machines and devices: Secondary | ICD-10-CM

## 2020-05-24 DIAGNOSIS — I10 Essential (primary) hypertension: Secondary | ICD-10-CM | POA: Diagnosis not present

## 2020-05-24 DIAGNOSIS — G4733 Obstructive sleep apnea (adult) (pediatric): Secondary | ICD-10-CM

## 2020-05-24 DIAGNOSIS — F32A Depression, unspecified: Secondary | ICD-10-CM

## 2020-05-24 DIAGNOSIS — Z87898 Personal history of other specified conditions: Secondary | ICD-10-CM

## 2020-05-24 DIAGNOSIS — G8929 Other chronic pain: Secondary | ICD-10-CM

## 2020-05-24 NOTE — Assessment & Plan Note (Signed)
BP Readings from Last 1 Encounters:  05/24/20 116/63   Well-controlled with Amlodipine and HCTZ Takes Lasix for LE swelling Counseled for compliance with the medications Advised DASH diet and moderate exercise/walking, at least 150 mins/week

## 2020-05-24 NOTE — Assessment & Plan Note (Signed)
On multiple medications Referred to Psychiatry

## 2020-05-24 NOTE — Assessment & Plan Note (Signed)
Uses cane/walker PT referral provided for strength training

## 2020-05-24 NOTE — Assessment & Plan Note (Signed)
On Keppra and Gabapentin Follows up with Neurology

## 2020-05-24 NOTE — Assessment & Plan Note (Signed)
Takes multiple medications Used to follow up with Behavioral health specialist On Seroquel, Remeron, Lexapro, Buspar and Prazosin for depression and anxiety according to the chart review Advised to call back with medication at home  Referred to Psychiatry

## 2020-05-24 NOTE — Assessment & Plan Note (Signed)
Continue Breztri and Albuterol (PRN) Follows up with Pulmonology

## 2020-05-24 NOTE — Assessment & Plan Note (Signed)
Care established Previous chart reviewed History and medications reviewed with the patient 

## 2020-05-24 NOTE — Patient Instructions (Signed)
Please call us to clarify your Behavioral health medications.  Please bring your medications to the office in the next visit.  Please continue to follow up with your Pulmonologist and Neurologist.  You are being referred to Rheumatology and Behavioral health specialist.  We have made a referral for physical therapy and weight management.  You are advised to take flu vaccine soon. Okay to take COVID vaccine in 07/2020.  Happy Holidays!

## 2020-05-24 NOTE — Assessment & Plan Note (Signed)
Uses CPAP Follows up with Pulmonology

## 2020-05-24 NOTE — Assessment & Plan Note (Signed)
Likely in the setting of morbid obesity and less activity Used to do PT Referred to PT for strength training

## 2020-05-24 NOTE — Progress Notes (Signed)
New Patient Office Visit  Subjective:  Patient ID: Elizabeth Bautista, female    DOB: Aug 24, 1975  Age: 44 y.o. MRN: [id number]  CC:  Chief Complaint  Patient presents with  . New Patient (Initial Visit)        HPI Elizabeth Bautista is a 44 year old female with PMH of HTN, OSA on CPAP, asthma, GERD, RA, depression with anxiety, chronic LE swelling, seizure disorder, chronic low back pain and morbid obesity who presents for establishing care. She is a former patient of Dr Anastasio Champion.  She has chronic low back pain, which is dull, intermittent. She has a h/o RA, for which she takes Plaquenil and needs a Rheumatology referral as her previous Rheumatogist does not take her insurance. She also c/o left leg pain for last few weeks, for which she has been taking Ibuprofen as needed. She denies any numbness, tingling or weakness in the left foot.  Patient follows up with Pulmonology for OSA and asthma. She had sleep study last night. Patient uses Breztri and Albuterol (PRN).  Patient used to follow Behavioral health specialist, but they also don't accept her insurance now and needs to find a new Psychiatrist. She reports taking multiple medications, but has not brought the medications to the visit.  Patient used to do PT for her back pain in [place], but has not been able to pursue since [place] pandemic started. She mentions that her back pain and mobility had improved with PT.  BP is well-controlled. Takes medications regularly. Patient denies headache, dizziness, chest pain, dyspnea or palpitations.  Patient had colonoscopy in 03/2019. Last Mammography unknown. Last PAP smear 11/2017.  Patient has not had COVID and flu vaccine.  Past Medical History:  Diagnosis Date  . Anemia   . Asthma   . Diabetes ([place])    on Metformin  . Edema   . 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 HTN (hypertension)   . Rheumatoid arthritis  ([place])    on Plaquenil   . Seizures ([place])    last seizure 2019  . Sleep apnea   . Sleep apnea     Past Surgical History:  Procedure Laterality Date  . APPENDECTOMY    . CESAREAN SECTION     x3    Family History  Problem Relation Age of Onset  . High blood pressure Mother   . Diabetes Father   . Diabetes Sister   . Heart Problems Sister   . Diabetes Brother   . Diabetes Paternal Grandmother   . Diabetes Other        father's side "everybody died from Diabetes"  . High blood pressure Other        mother's side, multiple siblings with this   . Heart attack Other        family member on mother's side   . Diabetes Paternal Aunt   . Seizures Cousin        not sibings to the other cousins with seizures  . Seizures Cousin        not sibings to the other cousins with seizures  . Seizures Cousin        not sibings to the other cousins with seizures  . Cervical cancer Maternal Aunt   . Breast cancer Cousin   . Dementia Maternal Aunt   . Asthma Maternal Aunt   . Heart Problems Maternal Grandmother   . Asthma Child   .  Asthma Child     Social History   Socioeconomic History  . Marital status: Single    Spouse name: Not on file  . Number of children: 5  . Years of education: Not on file  . Highest education level: 9th grade  Occupational History  . Not on file  Tobacco Use  . Smoking status: Former Smoker    Years: 10.00    Types: Cigarettes    Quit date: 06/02/2014    Years since quitting: 5.9  . Smokeless tobacco: Never Used  . Tobacco comment: during the 10 years of smoking, smoked 2-3 cigarettes/day  Vaping Use  . Vaping Use: Never used  Substance and Sexual Activity  . Alcohol use: Not Currently  . Drug use: Not Currently  . Sexual activity: Not on file  Other Topics Concern  . Not on file  Social History Narrative   Divorced.Lives with 3 kids.Originally from [place].Came from [place] 7 months ago.      02/27/2020   Right handed   Caffeine: none     Social Determinants of Health   Financial Resource Strain: Not on file  Food Insecurity: Not on file  Transportation Needs: Not on file  Physical Activity: Not on file  Stress: Not on file  Social Connections: Not on file  Intimate Partner Violence: Not on file    ROS Review of Systems  Constitutional: Positive for fatigue. Negative for chills and fever.  HENT: Negative for congestion, sinus pressure, sinus pain and sore throat.   Eyes: Negative for pain and discharge.  Respiratory: Negative for cough and shortness of breath.   Cardiovascular: Negative for chest pain and palpitations.  Gastrointestinal: Negative for abdominal pain, constipation, diarrhea, nausea and vomiting.  Endocrine: Negative for polydipsia and polyuria.  Genitourinary: Negative for dysuria and hematuria.  Musculoskeletal: Positive for arthralgias and back pain. Negative for neck pain and neck stiffness.  Skin: Negative for rash.  Neurological: Negative for dizziness and weakness.  Psychiatric/Behavioral: Positive for dysphoric mood and sleep disturbance. Negative for agitation, behavioral problems and suicidal ideas. The patient is nervous/anxious.     Objective:   Today's Vitals: BP 116/63 (BP Location: Right Arm, Patient Position: Sitting, Cuff Size: Normal)   Pulse 87   Temp 98.1 F (36.7 C) (Oral)   Resp 18   Ht 5' 5"  (1.651 m)   Wt 281 lb 1.9 oz (127.5 kg)   LMP 04/26/2020   SpO2 98%   BMI 46.78 kg/m   Physical Exam Vitals reviewed.  Constitutional:      General: She is not in acute distress.    Appearance: She is obese. She is not diaphoretic.  HENT:     Head: Normocephalic and atraumatic.     Nose: Nose normal.     Mouth/Throat:     Mouth: Mucous membranes are moist.  Eyes:     General: No scleral icterus.    Extraocular Movements: Extraocular movements intact.     Pupils: Pupils are equal, round, and reactive to light.  Cardiovascular:     Rate and Rhythm: Normal rate and  regular rhythm.     Pulses: Normal pulses.     Heart sounds: Normal heart sounds. No murmur heard.   Pulmonary:     Breath sounds: Normal breath sounds. No wheezing or rales.  Abdominal:     Palpations: Abdomen is soft.     Tenderness: There is no abdominal tenderness.  Musculoskeletal:     Cervical back: Neck supple. No tenderness.  Right lower leg: No edema.     Left lower leg: No edema.  Skin:    General: Skin is warm.     Findings: No rash.  Neurological:     General: No focal deficit present.     Mental Status: She is alert and oriented to person, place, and time.     Sensory: No sensory deficit.     Motor: No weakness.  Psychiatric:        Mood and Affect: Mood normal.        Behavior: Behavior normal.     Assessment & Plan:   Problem List Items Addressed This Visit      Encounter to establish care - Primary   Care established Previous chart reviewed History and medications reviewed with the patient       Cardiovascular and Mediastinum   Hypertension    BP Readings from Last 1 Encounters:  05/24/20 116/63   Well-controlled with Amlodipine and HCTZ Takes Lasix for LE swelling Counseled for compliance with the medications Advised DASH diet and moderate exercise/walking, at least 150 mins/week         Respiratory   OSA on CPAP    Uses CPAP Follows up with Pulmonology      Asthma, moderate persistent    Continue Breztri and Albuterol (PRN) Follows up with Pulmonology        Digestive   GERD (gastroesophageal reflux disease)    Takes Pantoprazole        Musculoskeletal and Integument   Rheumatoid arthritis (HCC) (Chronic)    On Plaquenil Flexeril and Ibuprofen PRN Rheumatology referral provided as her previous Rheumatologist does not take her insurance.      Relevant Orders   Ambulatory referral to Rheumatology     Other   Depression (Chronic)    Takes multiple medications Used to follow up with Behavioral health specialist On  Seroquel, Remeron, Lexapro, Buspar and Prazosin for depression and anxiety according to the chart review Advised to call back with medication at home  Referred to Psychiatry      Relevant Orders   Ambulatory referral to Psychiatry   Class 3 severe obesity with serious comorbidity and body mass index (BMI) of 45.0 to 49.9 in adult [facility])    Diet modification and moderate exercise as tolerated Referred to weight management clinic.      Relevant Orders   Amb Ref to Medical Weight Management   Anxiety    On multiple medications Referred to Psychiatry      Relevant Orders   Ambulatory referral to Psychiatry   History of seizures    On Keppra and Gabapentin Follows up with Neurology            Chronic low back pain    Likely in the setting of morbid obesity and less activity Used to do PT Referred to PT for strength training      Relevant Orders   Ambulatory referral to Physical Therapy   Physical deconditioning    Uses cane/walker PT referral provided for strength training      Relevant Orders   Ambulatory referral to Physical Therapy    Other Visit Diagnoses    Influenza vaccination declined          Outpatient Encounter Medications as of 05/24/2020  Medication Sig  . albuterol (PROAIR HFA) 108 (90 Base) MCG/ACT inhaler 2 puffs every 4 hours as needed only  if your can't catch your breath  . amLODipine (NORVASC) 5 MG tablet  Take 1 tablet (5 mg total) by mouth daily.  . blood glucose meter kit and supplies Dispense based on patient and insurance preference. Use up to four times daily as directed. (FOR ICD-10 E10.9, E11.9).  Marland Kitchen Blood Pressure Monitoring (SPHYGMOMANOMETER) MISC 1 each by Does not apply route daily.  . Budeson-Glycopyrrol-Formoterol (BREZTRI AEROSPHERE) 160-9-4.8 MCG/ACT AERO Inhale 2 puffs into the lungs 2 (two) times daily.  . busPIRone (BUSPAR) 7.5 MG tablet Take 7.5 mg by mouth 2 (two) times daily.  . Cholecalciferol 1.25 MG (50000 UT) TABS Take 1  tablet by mouth daily.  . cyclobenzaprine (FLEXERIL) 10 MG tablet Take 10 mg by mouth 3 (three) times daily as needed for muscle spasms.   Marland Kitchen escitalopram (LEXAPRO) 20 MG tablet Take 1 tablet by mouth every evening.  . famotidine (PEPCID) 20 MG tablet Take 20 mg by mouth daily.  . ferrous sulfate 325 (65 FE) MG tablet Take 325 mg by mouth 2 (two) times daily.  . fluticasone (CUTIVATE) 0.005 % ointment Apply 1 application topically daily.  . fluticasone (FLONASE) 50 MCG/ACT nasal spray Place 1 spray into both nostrils daily.  . furosemide (LASIX) 20 MG tablet Take 20 mg by mouth daily.  Marland Kitchen gabapentin (NEURONTIN) 100 MG capsule Take 1 capsule (100 mg total) by mouth 3 (three) times daily.  . hydrochlorothiazide (HYDRODIURIL) 25 MG tablet Take 1 tablet (25 mg total) by mouth daily. For high blood pressure.  . hydroxychloroquine (PLAQUENIL) 200 MG tablet Take 1 tablet (200 mg total) by mouth 2 (two) times daily.  . hydrOXYzine (ATARAX/VISTARIL) 25 MG tablet Take 25 mg by mouth 2 (two) times daily.  Marland Kitchen ibuprofen (ADVIL) 800 MG tablet Take 1 tablet (800 mg total) by mouth every 8 (eight) hours as needed.  Marland Kitchen KLOR-CON M20 20 MEQ tablet TAKE 1 TABLET BY MOUTH DAILY ONLY WHEN YOU TAKE FUROSEMIDE. (Patient taking differently: 20 mEq daily. Only when you take Furosemide.)  . metoprolol tartrate (LOPRESSOR) 50 MG tablet TAKE 1 TABLET 2 HR PRIOR TO CARDIAC PROCEDURE  . mirtazapine (REMERON) 30 MG tablet Take 30 mg by mouth at bedtime.  . pantoprazole (PROTONIX) 40 MG tablet Take 1 tablet (40 mg total) by mouth daily. Take 30-60 min before first meal of the day  . prazosin (MINIPRESS) 1 MG capsule Take 1 mg by mouth at bedtime.  Marland Kitchen QUEtiapine (SEROQUEL) 25 MG tablet Take 25 mg by mouth 2 (two) times daily.   . [DISCONTINUED] SYMBICORT 160-4.5 MCG/ACT inhaler Inhale 1 puff into the lungs in the morning and at bedtime.  . levETIRAcetam (KEPPRA) 500 MG tablet Take 1 tablet (500 mg total) by mouth 2 (two) times daily.   . montelukast (SINGULAIR) 10 MG tablet Take 1 tablet (10 mg total) by mouth at bedtime.  . [DISCONTINUED] azithromycin (ZITHROMAX) 250 MG tablet Zpack taper as directed (Patient not taking: Reported on 05/24/2020)  . [DISCONTINUED] escitalopram (LEXAPRO) 10 MG tablet Take 1 tablet (10 mg total) by mouth daily.  . [DISCONTINUED] mirtazapine (REMERON) 15 MG tablet Take 15 mg by mouth at bedtime as needed. (Patient not taking: Reported on 05/24/2020)  . [DISCONTINUED] predniSONE (DELTASONE) 10 MG tablet Take 10 mg by mouth 2 (two) times daily with a meal.  (Patient not taking: Reported on 05/24/2020)   No facility-administered encounter medications on file as of 05/24/2020.    Follow-up: Return in about 3 months (around 08/22/2020).   [name], MD

## 2020-05-24 NOTE — Assessment & Plan Note (Signed)
Diet modification and moderate exercise as tolerated Referred to weight management clinic.

## 2020-05-24 NOTE — Assessment & Plan Note (Signed)
Takes Pantoprazole

## 2020-05-24 NOTE — Assessment & Plan Note (Signed)
On Plaquenil Flexeril and Ibuprofen PRN Rheumatology referral provided as her previous Rheumatologist does not take her insurance.

## 2020-05-29 ENCOUNTER — Telehealth: Payer: Self-pay | Admitting: Internal Medicine

## 2020-05-29 NOTE — Telephone Encounter (Signed)
This my 2nd unsuccesful attempt to reach [name] and get her scheduled with the high risk managed medicaid pharmacist. I will attempt to reach her again in the next 7-14 days.

## 2020-05-30 ENCOUNTER — Ambulatory Visit (INDEPENDENT_AMBULATORY_CARE_PROVIDER_SITE_OTHER): Payer: Medicaid Other | Admitting: Allergy & Immunology

## 2020-05-30 ENCOUNTER — Other Ambulatory Visit: Payer: Self-pay

## 2020-05-30 ENCOUNTER — Encounter: Payer: Self-pay | Admitting: Allergy & Immunology

## 2020-05-30 VITALS — BP 138/82 | HR 90 | Temp 98.2°F | Resp 20 | Ht 64.17 in | Wt 285.0 lb

## 2020-05-30 DIAGNOSIS — J4550 Severe persistent asthma, uncomplicated: Secondary | ICD-10-CM | POA: Diagnosis not present

## 2020-05-30 DIAGNOSIS — J302 Other seasonal allergic rhinitis: Secondary | ICD-10-CM

## 2020-05-30 DIAGNOSIS — T781XXD Other adverse food reactions, not elsewhere classified, subsequent encounter: Secondary | ICD-10-CM

## 2020-05-30 DIAGNOSIS — J3089 Other allergic rhinitis: Secondary | ICD-10-CM | POA: Diagnosis not present

## 2020-05-30 NOTE — Patient Instructions (Addendum)
1. Seasonal and perennial allergic rhinitis - Testing today showed: grasses, ragweed, weeds, trees, indoor molds, outdoor molds, dust mites, cat and cockroach - Copy of test results provided.  - Avoidance measures provided. - Continue with: Singulair (montelukast)  daily - Start taking: Zyrtec (cetirizine)  tablet once daily - You can use an extra dose of the antihistamine, if needed, for breakthrough symptoms.  - Consider nasal saline rinses 1-2 times daily to remove allergens from the nasal cavities as well as help with mucous clearance (this is especially helpful to do before the nasal sprays are given) - Consider allergy shots as a means of long-term control. - Allergy shots "re-train" and "reset" the immune system to ignore environmental allergens and decrease the resulting immune response to those allergens (sneezing, itchy watery eyes, runny nose, nasal congestion, etc).    - Allergy shots improve symptoms in 75-85% of patients.  - We can discuss more at the next appointment if the medications are not working for you.  2. Severe persistent asthma, uncomplicated - Lung testing not done today since our machine was not working.  Midwife and demonstration provided. - Strongly consider adding on Nucala (mepolizumab) monthly to help with breathing.  - Daily controller medication(s): Breztri two puffs twice daily with spacer and Singulair (montelukast  daily - Prior to physical activity: albuterol 2 puffs 10-15 minutes before physical activity. - Rescue medications: albuterol 4 puffs every 4-6 hours as needed - Asthma control goals:  * Full participation in all desired activities (may need albuterol before activity) * Albuterol use two time or less a week on average (not counting use with activity) * Cough interfering with sleep two time or less a month * Oral steroids no more than once a year * No hospitalizations  3. Return in about 4 weeks (around 06/27/2020).    Please inform us of any Emergency Department visits, hospitalizations, or changes in symptoms. Call us before going to the ED for breathing or allergy symptoms since we might be able to fit you in for a sick visit. Feel free to contact us anytime with any questions, problems, or concerns.  It was a pleasure to meet you today!  Websites that have reliable patient information: 1. American Academy of Asthma, Allergy, and Immunology: www.aaaai.org 2. Food Allergy Research and Education (FARE): foodallergy.org 3. Mothers of Asthmatics: http://www.asthmacommunitynetwork.org 4. American College of Allergy, Asthma, and Immunology: www.acaai.org   COVID-19 Vaccine Information can be found at: [email or web address] For questions related to vaccine distribution or appointments, please email [email or web address] or call [phone].     "Like" [place] on Facebook and Instagram for our latest updates!       Make sure you are registered to vote! If you have moved or changed any of your contact information, you will need to get this updated before voting!  In some cases, you MAY be able to register to vote online: [email or web address]    Reducing Pollen Exposure  The American Academy of Allergy, Asthma and Immunology suggests the following steps to reduce your exposure to pollen during allergy seasons.    1. Do not hang sheets or clothing out to dry; pollen may collect on these items. 2. Do not mow lawns or spend time around freshly cut grass; mowing stirs up pollen. 3. Keep windows closed at night.  Keep car windows closed while driving. 4. Minimize morning activities outdoors, a time when pollen counts are usually at their highest. 5. Stay indoors as much as possible when pollen counts  or humidity is high and on windy days when pollen tends to remain in the air longer. 6. Use air conditioning when possible.   Many air conditioners have filters that trap the pollen spores. 7. Use a HEPA room air filter to remove pollen form the indoor air you breathe.  Control of Mold Allergen   Mold and fungi can grow on a variety of surfaces provided certain temperature and moisture conditions exist.  Outdoor molds grow on plants, decaying vegetation and soil.  The major outdoor mold, Alternaria and Cladosporium, are found in very high numbers during hot and dry conditions.  Generally, a late Summer - Fall peak is seen for common outdoor fungal spores.  Rain will temporarily lower outdoor mold spore count, but counts rise rapidly when the rainy period ends.  The most important indoor molds are Aspergillus and Penicillium.  Dark, humid and poorly ventilated basements are ideal sites for mold growth.  The next most common sites of mold growth are the bathroom and the kitchen.  Outdoor (Seasonal) Mold Control   1. Use air conditioning and keep windows closed 2. Avoid exposure to decaying vegetation. 3. Avoid leaf raking. 4. Avoid grain handling. 5. Consider wearing a face mask if working in moldy areas.   Indoor (Perennial) Mold Control     1. Maintain humidity below 50%. 2. Clean washable surfaces with 5% bleach solution. 3. Remove sources e.g. contaminated carpets.     Control of Dog or Cat Allergen  Avoidance is the best way to manage a dog or cat allergy. If you have a dog or cat and are allergic to dog or cats, consider removing the dog or cat from the home. If you have a dog or cat but don't want to find it a new home, or if your family wants a pet even though someone in the household is allergic, here are some strategies that may help keep symptoms at bay:  1. Keep the pet out of your bedroom and restrict it to only a few rooms. Be advised that keeping the dog or cat in only one room will not limit the allergens to that room. 2. Don't pet, hug or kiss the dog or cat; if you do, wash your hands with  soap and water. 3. High-efficiency particulate air (HEPA) cleaners run continuously in a bedroom or living room can reduce allergen levels over time. 4. Regular use of a high-efficiency vacuum cleaner or a central vacuum can reduce allergen levels. 5. Giving your dog or cat a bath at least once a week can reduce airborne allergen.  Control of Dust Mite Allergen    Dust mites play a major role in allergic asthma and rhinitis.  They occur in environments with high humidity wherever human skin is found.  Dust mites absorb humidity from the atmosphere (ie, they do not drink) and feed on organic matter (including shed human and animal skin).  Dust mites are a microscopic type of insect that you cannot see with the naked eye.  High levels of dust mites have been detected from mattresses, pillows, carpets, upholstered furniture, bed covers, clothes, soft toys and any woven material.  The principal allergen of the dust mite is found in its feces.  A gram of dust may contain 1,000 mites and 250,000 fecal particles.  Mite antigen is easily measured in the air during house cleaning activities.  Dust mites do not bite and do not cause harm to humans, other than by triggering allergies/asthma.  Ways to decrease your exposure to dust mites in your home:  1. Encase mattresses, box springs and pillows with a mite-impermeable barrier or cover   2. Wash sheets, blankets and drapes weekly in hot water (130 F) with detergent and dry them in a dryer on the hot setting.  3. Have the room cleaned frequently with a vacuum cleaner and a damp dust-mop.  For carpeting or rugs, vacuuming with a vacuum cleaner equipped with a high-efficiency particulate air (HEPA) filter.  The dust mite allergic individual should not be in a room which is being cleaned and should wait 1 hour after cleaning before going into the room. 4. Do not sleep on upholstered furniture (eg, couches).   5. If possible removing carpeting, upholstered  furniture and drapery from the home is ideal.  Horizontal blinds should be eliminated in the rooms where the person spends the most time (bedroom, study, television room).  Washable vinyl, roller-type shades are optimal. 6. Remove all non-washable stuffed toys from the bedroom.  Wash stuffed toys weekly like sheets and blankets above.   7. Reduce indoor humidity to less than 50%.  Inexpensive humidity monitors can be purchased at most hardware stores.  Do not use a humidifier as can make the problem worse and are not recommended.  Control of Cockroach Allergen  Cockroach allergen has been identified as an important cause of acute attacks of asthma, especially in urban settings.  There are fifty-five species of cockroach that exist in the [place], however only three, the [place], [place] species produce allergen that can affect patients with Asthma.  Allergens can be obtained from fecal particles, egg casings and secretions from cockroaches.    1. Remove food sources. 2. Reduce access to water. 3. Seal access and entry points. 4. Spray runways with 0.5-1% Diazinon or Chlorpyrifos 5. Blow boric acid power under stoves and refrigerator. 6. Place bait stations (hydramethylnon) at feeding sites.  Allergy Shots   Allergies are the result of a chain reaction that starts in the immune system. Your immune system controls how your body defends itself. For instance, if you have an allergy to pollen, your immune system identifies pollen as an invader or allergen. Your immune system overreacts by producing antibodies called Immunoglobulin E (IgE). These antibodies travel to cells that release chemicals, causing an allergic reaction.  The concept behind allergy immunotherapy, whether it is received in the form of shots or tablets, is that the immune system can be desensitized to specific allergens that trigger allergy symptoms. Although it requires time and patience, the payback can be  long-term relief.  How Do Allergy Shots Work?  Allergy shots work much like a vaccine. Your body responds to injected amounts of a particular allergen given in increasing doses, eventually developing a resistance and tolerance to it. Allergy shots can lead to decreased, minimal or no allergy symptoms.  There generally are two phases: build-up and maintenance. Build-up often ranges from three to six months and involves receiving injections with increasing amounts of the allergens. The shots are typically given once or twice a week, though more rapid build-up schedules are sometimes used.  The maintenance phase begins when the most effective dose is reached. This dose is different for each person, depending on how allergic you are and your response to the build-up injections. Once the maintenance dose is reached, there are longer periods between injections, typically two to four weeks.  Occasionally doctors give cortisone-type shots that can temporarily reduce allergy symptoms.  These types of shots are different and should not be confused with allergy immunotherapy shots.  Who Can Be Treated with Allergy Shots?  Allergy shots may be a good treatment approach for people with allergic rhinitis (hay fever), allergic asthma, conjunctivitis (eye allergy) or stinging insect allergy.   Before deciding to begin allergy shots, you should consider:  . The length of allergy season and the severity of your symptoms . Whether medications and/or changes to your environment can control your symptoms . Your desire to avoid long-term medication use . Time: allergy immunotherapy requires a major time commitment . Cost: may vary depending on your insurance coverage  Allergy shots for children age 52 and older are effective and often well tolerated. They might prevent the onset of new allergen sensitivities or the progression to asthma.  Allergy shots are not started on patients who are pregnant but can be  continued on patients who become pregnant while receiving them. In some patients with other medical conditions or who take certain common medications, allergy shots may be of risk. It is important to mention other medications you talk to your allergist.   When Will I Feel Better?  Some may experience decreased allergy symptoms during the build-up phase. For others, it may take as long as 12 months on the maintenance dose. If there is no improvement after a year of maintenance, your allergist will discuss other treatment options with you.  If you aren't responding to allergy shots, it may be because there is not enough dose of the allergen in your vaccine or there are missing allergens that were not identified during your allergy testing. Other reasons could be that there are high levels of the allergen in your environment or major exposure to non-allergic triggers like tobacco smoke.  What Is the Length of Treatment?  Once the maintenance dose is reached, allergy shots are generally continued for three to five years. The decision to stop should be discussed with your allergist at that time. Some people may experience a permanent reduction of allergy symptoms. Others may relapse and a longer course of allergy shots can be considered.  What Are the Possible Reactions?  The two types of adverse reactions that can occur with allergy shots are local and systemic. Common local reactions include very mild redness and swelling at the injection site, which can happen immediately or several hours after. A systemic reaction, which is less common, affects the entire body or a particular body system. They are usually mild and typically respond quickly to medications. Signs include increased allergy symptoms such as sneezing, a stuffy nose or hives.  Rarely, a serious systemic reaction called anaphylaxis can develop. Symptoms include swelling in the throat, wheezing, a feeling of tightness in the chest, nausea or  dizziness. Most serious systemic reactions develop within 30 minutes of allergy shots. This is why it is strongly recommended you wait in your doctor's office for 30 minutes after your injections. Your allergist is trained to watch for reactions, and his or her staff is trained and equipped with the proper medications to identify and treat them.  Who Should Administer Allergy Shots?  The preferred location for receiving shots is your prescribing allergist's office. Injections can sometimes be given at another facility where the physician and staff are trained to recognize and treat reactions, and have received instructions by your prescribing allergist.

## 2020-05-30 NOTE — Progress Notes (Signed)
NEW PATIENT  Date of Service/Encounter:  05/30/20  Referring provider: [name], MD   Assessment:   Severe persistent asthma, uncomplicated - with eosinophilic endotype (AEC 354 in September 2021)  Seasonal and perennial allergic rhinitis (grasses, ragweed, weeds, trees, indoor molds, outdoor molds, dust mites, cat and cockroach) - good candidate for allergen immunotherapy, but we want to get her breathing under better control first  COVID-19 pneumonia (November 2021) - with bilateral groundglass opacities on chest CT  COVID-19 vaccine hesitant  Rheumatoid arthritis - on Plaquenil  Plan/Recommendations:   1. Seasonal and perennial allergic rhinitis - Testing today showed: grasses, ragweed, weeds, trees, indoor molds, outdoor molds, dust mites, cat and cockroach - Copy of test results provided.  - Avoidance measures provided. - Continue with: Singulair (montelukast) 2m daily - Start taking: Zyrtec (cetirizine) 136mtablet once daily - You can use an extra dose of the antihistamine, if needed, for breakthrough symptoms.  - Consider nasal saline rinses 1-2 times daily to remove allergens from the nasal cavities as well as help with mucous clearance (this is especially helpful to do before the nasal sprays are given) - Consider allergy shots as a means of long-term control. - Allergy shots "re-train" and "reset" the immune system to ignore environmental allergens and decrease the resulting immune response to those allergens (sneezing, itchy watery eyes, runny nose, nasal congestion, etc).    - Allergy shots improve symptoms in 75-85% of patients.  - We can discuss more at the next appointment if the medications are not working for you.  2. Severe persistent asthma, uncomplicated - Lung testing not done today since our machine was not working.  - Conservator, museum/gallerynd demonstration provided. - Strongly consider adding on Nucala (mepolizumab) monthly to help with breathing.   - Daily controller medication(s): Breztri two puffs twice daily with spacer and Singulair (montelukast 1063maily - Prior to physical activity: albuterol 2 puffs 10-15 minutes before physical activity. - Rescue medications: albuterol 4 puffs every 4-6 hours as needed - Asthma control goals:  * Full participation in all desired activities (may need albuterol before activity) * Albuterol use two time or less a week on average (not counting use with activity) * Cough interfering with sleep two time or less a month * Oral steroids no more than once a year * No hospitalizations  3. Return in about 4 weeks (around 06/27/2020).  Subjective:   Elizabeth Bautista a 44 32o. female presenting today for evaluation of  Chief Complaint  Patient presents with  . Asthma    allergy    Elizabeth Bautista a history of the following: Patient Active Problem List   Diagnosis Date Noted  . Encounter to establish care 05/24/2020  . Chronic low back pain 05/24/2020  . Physical deconditioning 05/24/2020  . COVID-19 virus infection 05/16/2020  . Asthma, moderate persistent 03/08/2020  . GERD (gastroesophageal reflux disease) 03/07/2020  . Anxiety 03/07/2020  . History of seizures 03/07/2020  . Upper airway cough syndrome 02/10/2020  . Swelling of lower extremity 02/01/2020  . Hypertension 02/01/2020  . Vitamin D deficiency 02/01/2020  . Type 2 diabetes mellitus with hyperglycemia, without long-term current use of insulin ([facility]9/06/2019  . Anemia 02/01/2020  . Class 3 severe obesity with serious comorbidity and body mass index (BMI) of 45.0 to 49.9 in adult ([facility]/22[facility]21  . OSA on CPAP 10/18/2019  . Depression 10/18/2019  . Rheumatoid arthritis ([facility]5/18/2021    History obtained from: chart review  and patient.  Elizabeth Bautista was referred by [name], MD.     Elizabeth Bautista is a 44 y.o. female presenting for an evaluation of asthma and allergies.  She actually moved here from [place] around 9 months  ago. She has family that live here which is why she ended up down here.    Asthma/Respiratory Symptom History: She has struggled with asthma since she was 44 years old. She was pregnant at that time and actually giving birth and this was the first time that she had developed asthma. She has been hospitalized multiple times for her asthma. She was been close to intubation, but she has on BiPAP. Currently she is on albuterol as needed. She is currently on Breztri for her symptoms. She was started on that in December 2021. She has noticed somewhat of an improvement on these symptoms. Prior to this, she was on Spiriva and Symbicort. She estimates that she was on prednisone more than five times in the last calendar year. It does help when she is on prednisone. She has seen Dr. Halford Chessman for her asthma. She was seeing Dr. Melvyn Novas before. She has not been on a biologic at all. She needs a new mask and CPAP machine. She had a new sleep study done and results are pending.   She has had a chest CT in December that demonstrated "faint patchy ground-glass opacities in the lungs". She has not discussed these findings with Dr. Halford Chessman yet. She did have COVID pneumonia on November 25th.  She does not "trust" the vaccination. She has had COVID only the one time. She did not receive any antibody infusions.  She has never had lung testing since moving. She tells me that the last time that she did it, she went to the hospital. This was back in [place]. She has not done spirometry with pulmonology group. Per Dr. Gustavus Bryant first note from November 2021, a spirometry from March 2019 showed an FEV1 of 2.03 L (82%) with a ratio of 0.73.   Of note, she was a smoker until 2016.  Allergic Rhinitis Symptom History: She was allergy tested years ago. She has never been on allergy shots to her knowledge. This was when she was a child. She uses montelukast now and Flonase. She is uses antihistamines as needed. She is not on antibiotics often but she  just completed a course of antibiotics for "bronchitis".    Food Allergy Symptom History: She does not have any food allergy symptoms, but she tells me that her daughter was diagnosed with a bunch of food allergies to foods which she had been "eating her entire life without a problem". She tolerates all the major food allergens without adverse event. She has no particular concerns regarding food allergies, but she would like to be tested.   She has a very complicated past medical history including seizures as well as anemia.   therwise, there is no history of other atopic diseases, including food allergies, drug allergies, stinging insect allergies, eczema, urticaria or contact dermatitis. There is no significant infectious history. Vaccinations are up to date.    Past Medical History: Patient Active Problem List   Diagnosis Date Noted  . Encounter to establish care 05/24/2020  . Chronic low back pain 05/24/2020  . Physical deconditioning 05/24/2020  . COVID-19 virus infection 05/16/2020  . Asthma, moderate persistent 03/08/2020  . GERD (gastroesophageal reflux disease) 03/07/2020  . Anxiety 03/07/2020  . History of seizures 03/07/2020  . Upper airway cough  syndrome 02/10/2020  . Swelling of lower extremity 02/01/2020  . Hypertension 02/01/2020  . Vitamin D deficiency 02/01/2020  . Type 2 diabetes mellitus with hyperglycemia, without long-term current use of insulin ([employer]) 02/01/2020  . Anemia 02/01/2020  . Class 3 severe obesity with serious comorbidity and body mass index (BMI) of 45.0 to 49.9 in adult [facility]) 10/22/2019  . OSA on CPAP 10/18/2019  . Depression 10/18/2019  . Rheumatoid arthritis ([place]) 10/18/2019    Medication List:  Allergies as of 05/30/2020      Reactions   Phenytoin Anaphylaxis   Other reaction(s): swelling and heart rate decreased   Phenylbutazones       Medication List       Accurate as of May 30, 2020  1:11 PM. If you have any questions, ask your  nurse or doctor.        albuterol 108 (90 Base) MCG/ACT inhaler Commonly known as: ProAir HFA 2 puffs every 4 hours as needed only  if your can't catch your breath   amLODipine 5 MG tablet Commonly known as: NORVASC Take 1 tablet (5 mg total) by mouth daily.   blood glucose meter kit and supplies Dispense based on patient and insurance preference. Use up to four times daily as directed. (FOR ICD-10 E10.9, E11.9).   Breztri Aerosphere 160-9-4.8 MCG/ACT Aero Generic drug: Budeson-Glycopyrrol-Formoterol Inhale 2 puffs into the lungs 2 (two) times daily.   busPIRone 7.5 MG tablet Commonly known as: BUSPAR Take 7.5 mg by mouth 2 (two) times daily.   Cholecalciferol 1.25 MG (50000 UT) Tabs Take 1 tablet by mouth daily.   cyclobenzaprine 10 MG tablet Commonly known as: FLEXERIL Take 10 mg by mouth 3 (three) times daily as needed for muscle spasms.   escitalopram 20 MG tablet Commonly known as: LEXAPRO Take 1 tablet by mouth every evening.   famotidine 20 MG tablet Commonly known as: PEPCID Take 20 mg by mouth daily.   ferrous sulfate 325 (65 FE) MG tablet Take 325 mg by mouth 2 (two) times daily.   fluticasone 0.005 % ointment Commonly known as: CUTIVATE Apply 1 application topically daily.   fluticasone 50 MCG/ACT nasal spray Commonly known as: FLONASE Place 1 spray into both nostrils daily.   furosemide 20 MG tablet Commonly known as: LASIX Take 20 mg by mouth daily.   gabapentin 100 MG capsule Commonly known as: Neurontin Take 1 capsule (100 mg total) by mouth 3 (three) times daily.   hydrochlorothiazide 25 MG tablet Commonly known as: HYDRODIURIL Take 1 tablet (25 mg total) by mouth daily. For high blood pressure.   hydroxychloroquine 200 MG tablet Commonly known as: PLAQUENIL Take 1 tablet (200 mg total) by mouth 2 (two) times daily.   hydrOXYzine 25 MG tablet Commonly known as: ATARAX/VISTARIL Take 25 mg by mouth 2 (two) times daily.   ibuprofen 800  MG tablet Commonly known as: ADVIL Take 1 tablet (800 mg total) by mouth every 8 (eight) hours as needed.   Klor-Con M20 20 MEQ tablet Generic drug: potassium chloride SA TAKE 1 TABLET BY MOUTH DAILY ONLY WHEN YOU TAKE FUROSEMIDE. What changed: See the new instructions.   levETIRAcetam 500 MG tablet Commonly known as: Keppra Take 1 tablet (500 mg total) by mouth 2 (two) times daily.   metoprolol tartrate 50 MG tablet Commonly known as: LOPRESSOR TAKE 1 TABLET 2 HR PRIOR TO CARDIAC PROCEDURE   mirtazapine 30 MG tablet Commonly known as: REMERON Take 30 mg by mouth at bedtime.  montelukast 10 MG tablet Commonly known as: SINGULAIR Take 1 tablet (10 mg total) by mouth at bedtime.   pantoprazole 40 MG tablet Commonly known as: Protonix Take 1 tablet (40 mg total) by mouth daily. Take 30-60 min before first meal of the day   prazosin 1 MG capsule Commonly known as: MINIPRESS Take 1 mg by mouth at bedtime.   QUEtiapine 25 MG tablet Commonly known as: SEROQUEL Take 25 mg by mouth 2 (two) times daily.   Sphygmomanometer Misc 1 each by Does not apply route daily.       Birth History: non-contributory  Developmental History: non-contributory  Past Surgical History: Past Surgical History:  Procedure Laterality Date  . APPENDECTOMY    . CESAREAN SECTION     x3     Family History: Family History  Problem Relation Age of Onset  . High blood pressure Mother   . Diabetes Father   . Diabetes Sister   . Heart Problems Sister   . Diabetes Brother   . Diabetes Paternal Grandmother   . Diabetes Other        father's side "everybody died from Diabetes"  . High blood pressure Other        mother's side, multiple siblings with this   . Heart attack Other        family member on mother's side   . Diabetes Paternal Aunt   . Seizures Cousin        not sibings to the other cousins with seizures  . Seizures Cousin        not sibings to the other cousins with seizures   . Seizures Cousin        not sibings to the other cousins with seizures  . Cervical cancer Maternal Aunt   . Breast cancer Cousin   . Dementia Maternal Aunt   . Asthma Maternal Aunt   . Heart Problems Maternal Grandmother   . Asthma Child   . Asthma Child      Social History: Elizabeth Bautista lives at home with her family. She lives in a duplex of unknown age. There is wood throughout the home. She has electric heating and central cooling. There are birds, rabbit, and dog, and a bearded dragon in the home. She does cannot have dust mite covers on bedding. There is no current tobacco exposure. She smoked for a total of 12 years, around 3 cigarettes/day. She is currently not working.   Review of Systems  Constitutional: Negative.  Negative for fever, malaise/fatigue and weight loss.  HENT: Positive for congestion. Negative for ear discharge and ear pain.   Eyes: Negative for pain, discharge and redness.  Respiratory: Positive for cough and shortness of breath. Negative for sputum production and wheezing.   Cardiovascular: Negative.  Negative for chest pain and palpitations.  Gastrointestinal: Negative for abdominal pain, constipation, diarrhea, heartburn, nausea and vomiting.  Skin: Negative.  Negative for itching and rash.  Neurological: Negative for dizziness and headaches.  Endo/Heme/Allergies: Negative for environmental allergies. Does not bruise/bleed easily.       Objective:   Blood pressure 138/82, pulse 90, temperature 98.2 F (36.8 C), temperature source Temporal, resp. rate 20, height 5' 4.17" (1.63 m), weight 285 lb (129.3 kg), SpO2 97 %. Body mass index is 48.66 kg/m.   Physical Exam:   Physical Exam Constitutional:      Appearance: She is well-developed. She is obese.     Comments: Very pleasant female. Cooperative with the exam.   HENT:  Head: Normocephalic and atraumatic.     Right Ear: Tympanic membrane, ear canal and external ear normal.     Left Ear: Tympanic  membrane, ear canal and external ear normal.     Nose: No nasal deformity, septal deviation, mucosal edema, rhinorrhea or epistaxis.     Right Turbinates: Enlarged, swollen and pale.     Left Turbinates: Enlarged, swollen and pale.     Right Sinus: No maxillary sinus tenderness or frontal sinus tenderness.     Left Sinus: No maxillary sinus tenderness or frontal sinus tenderness.     Comments: No polyps.     Mouth/Throat:     Mouth: Oropharynx is clear and moist. Mucous membranes are not pale and not dry.     Pharynx: Uvula midline.  Eyes:     General: Allergic shiner present.        Right eye: No discharge.        Left eye: No discharge.     Extraocular Movements: EOM normal.     Conjunctiva/sclera: Conjunctivae normal.     Right eye: Right conjunctiva is not injected. No chemosis.    Left eye: Left conjunctiva is not injected. No chemosis.    Pupils: Pupils are equal, round, and reactive to light.  Cardiovascular:     Rate and Rhythm: Normal rate and regular rhythm.     Heart sounds: Normal heart sounds.  Pulmonary:     Effort: Pulmonary effort is normal. No tachypnea, accessory muscle usage or respiratory distress.     Breath sounds: Wheezing present. No rhonchi or rales.     Comments: Faint expiratory wheezes throughout. No coughing or SOB (did not use Breztri today). Chest:     Chest wall: No tenderness.  Lymphadenopathy:     Cervical: No cervical adenopathy.  Skin:    General: Skin is warm.     Capillary Refill: Capillary refill takes less than 2 seconds.     Coloration: Skin is not pale.     Findings: No abrasion, erythema, petechiae or rash. Rash is not papular, urticarial or vesicular.     Comments: No eczematous or urticarial lesions noted.  Neurological:     Mental Status: She is alert.  Psychiatric:        Mood and Affect: Mood and affect normal.        Behavior: Behavior is cooperative.      Diagnostic studies:   Allergy Studies:     Airborne Adult Perc -  05/30/20 0951    Time Antigen Placed 5701    Allergen Manufacturer Lavella Hammock    Location Back    Number of Test 59    Panel 1 Select    1. Control-Buffer 50% Glycerol Negative    2. Control-Histamine 1 mg/ml 2+    3. Albumin saline Negative    4. [place] Negative    5. [place] Negative    6. Johnson Negative    7. Hardin Blue Negative    8. Meadow Fescue Negative    9. Perennial Rye 2+    10. Sweet Vernal Negative    11. Timothy Negative    12. Cocklebur Negative    13. Burweed Marshelder Negative    14. Ragweed, short Negative    15. Ragweed, Giant Negative    16. Plantain,  English Negative    17. Lamb's Quarters Negative    18. Sheep Sorrell Negative    19. Rough Pigweed Negative    20. Skeet Simmer Elder, Rough Negative  21. Mugwort, Common Negative    22. Ash mix Negative    23. Birch mix Negative    24. Beech American Negative    25. Box, Elder Negative    26. Cedar, red Negative    27. Cottonwood, [place] Negative    28. Elm mix Negative    29. Hickory 3+    30. Maple mix Negative    31. Oak, [place] mix Negative    32. Pecan Pollen Negative    33. Pine mix Negative    34. Sycamore Eastern Negative    35. Anthoston, Black Pollen Negative    36. Alternaria alternata Negative    37. Cladosporium Herbarum Negative    38. Aspergillus mix Negative    39. Penicillium mix Negative    40. Bipolaris sorokiniana (Helminthosporium) Negative    41. Drechslera spicifera (Curvularia) Negative    42. Mucor plumbeus Negative    43. Fusarium moniliforme Negative    44. Aureobasidium pullulans (pullulara) Negative    45. Rhizopus oryzae Negative    46. Botrytis cinera Negative    47. Epicoccum nigrum Negative    48. Phoma betae Negative    49. Candida Albicans Negative    50. Trichophyton mentagrophytes Negative    51. Mite, D Farinae  5,000 AU/ml Negative    52. Mite, D Pteronyssinus  5,000 AU/ml Negative    53. Cat Hair 10,000 BAU/ml Negative    54.  Dog Epithelia Negative     55. Mixed Feathers Negative    56. Horse Epithelia Negative    57. Cockroach, German Negative    58. Mouse Negative    59. Tobacco Leaf Negative          Food Perc - 05/30/20 0952      Test Information   Time Antigen Placed 2890    Allergen Manufacturer Lavella Hammock    Location Back    Number of allergen test 10    Food Select      Food   1. Peanut Negative    2. Soybean food Negative    3. Wheat, whole Negative    4. Sesame Negative    5. Milk, cow Negative    6. Egg White, chicken Negative    7. Casein Negative    8. Shellfish mix Negative    9. Fish mix Negative    10. Cashew Negative          Intradermal - 05/30/20 1009    Time Antigen Placed 1009    Allergen Manufacturer Lavella Hammock    Location Arm    Number of Test 13    Intradermal Select    Control Negative    Guatem[place] 1+    Johnson 1+    Ragweed mix 1+    Weed mix Negative    Mold 1 Negative    Mold 2 3+    Mold 3 3+    Mold 4 3+    Cat 4+    Dog Negative    Cockroach 3+    Mite mix 4+           Allergy testing results were read and interpreted by myself, documented by clinical staff.         Salvatore Marvel, MD [facility]

## 2020-06-04 ENCOUNTER — Other Ambulatory Visit (HOSPITAL_COMMUNITY): Payer: Self-pay | Admitting: Internal Medicine

## 2020-06-04 ENCOUNTER — Ambulatory Visit (INDEPENDENT_AMBULATORY_CARE_PROVIDER_SITE_OTHER): Payer: Medicaid Other | Admitting: Adult Health

## 2020-06-04 ENCOUNTER — Other Ambulatory Visit: Payer: Self-pay

## 2020-06-04 ENCOUNTER — Encounter: Payer: Self-pay | Admitting: Adult Health

## 2020-06-04 ENCOUNTER — Other Ambulatory Visit (HOSPITAL_COMMUNITY)
Admission: RE | Admit: 2020-06-04 | Discharge: 2020-06-04 | Disposition: A | Discharge status: Home / Self Care | Payer: Medicaid Other | Source: Ambulatory Visit | Attending: Adult Health | Admitting: Adult Health

## 2020-06-04 ENCOUNTER — Telehealth: Payer: Self-pay | Admitting: Pulmonary Disease

## 2020-06-04 VITALS — BP 137/93 | HR 89 | Ht 65.0 in | Wt 280.0 lb

## 2020-06-04 DIAGNOSIS — Z01419 Encounter for gynecological examination (general) (routine) without abnormal findings: Secondary | ICD-10-CM | POA: Insufficient documentation

## 2020-06-04 DIAGNOSIS — N946 Dysmenorrhea, unspecified: Secondary | ICD-10-CM

## 2020-06-04 DIAGNOSIS — Z113 Encounter for screening for infections with a predominantly sexual mode of transmission: Secondary | ICD-10-CM | POA: Diagnosis not present

## 2020-06-04 DIAGNOSIS — F419 Anxiety disorder, unspecified: Secondary | ICD-10-CM

## 2020-06-04 DIAGNOSIS — G4733 Obstructive sleep apnea (adult) (pediatric): Secondary | ICD-10-CM

## 2020-06-04 DIAGNOSIS — Z1231 Encounter for screening mammogram for malignant neoplasm of breast: Secondary | ICD-10-CM

## 2020-06-04 DIAGNOSIS — N921 Excessive and frequent menstruation with irregular cycle: Secondary | ICD-10-CM | POA: Diagnosis not present

## 2020-06-04 DIAGNOSIS — Z1211 Encounter for screening for malignant neoplasm of colon: Secondary | ICD-10-CM

## 2020-06-04 DIAGNOSIS — F32A Depression, unspecified: Secondary | ICD-10-CM | POA: Insufficient documentation

## 2020-06-04 LAB — HEMOCCULT GUIAC POC 1CARD (OFFICE): Fecal Occult Blood, POC: NEGATIVE

## 2020-06-04 NOTE — Telephone Encounter (Signed)
Called and left message on voicemail to please return phone call to go over results. Contact number provided. 

## 2020-06-04 NOTE — Progress Notes (Signed)
  Subjective:     Patient ID: Elizabeth Bautista, female   DOB: Oct 28, 1975, 45 y.o.   MRN: [id number]  HPI Herbert is a 45 year old black female,single, G5P5 in for pelvic and pap. She had physical with PCP. She moved here from [place].  PCP is Dr Allena Katz.   Review of Systems Has heavy irregular periods Changes tampons and pads often Has pain with period and occasionally with sex Has pain in rectal area if coughs Reviewed past medical,surgical, social and family history. Reviewed medications and allergies.     Objective:   Physical Exam BP (!) 137/93 (BP Location: Right Arm, Patient Position: Sitting, Cuff Size: Large)   Pulse 89   Ht 5\' 5"  (1.651 m)   Wt 280 lb (127 kg)   LMP 05/19/2020   BMI 46.59 kg/m    Skin warm and dry.Pelvic: external genitalia is normal in appearance no lesions, vagina: pink with good moisture and rugae,urethra has no lesions or masses noted, cervix:smooth and bulbous,pap with HRHPV genotyping performed, uterus: normal size, shape and contour, non tender, no masses felt, adnexa: no masses or tenderness noted. Bladder is non tender and no masses felt.On rectal has good tone, no masses felt, and hemoccult was negative. AA is 1 Fall risk is high, she is using a cane PHQ 9 score is 25, on meds,denies any SI GAD 7 score is 21   Upstream - 06/04/20 1009      Pregnancy Intention Screening   Does the patient want to become pregnant in the next year? No    Does the patient's partner want to become pregnant in the next year? No    Would the patient like to discuss contraceptive options today? No      Contraception Wrap Up   Current Method Abstinence    End Method Abstinence    Contraception Counseling Provided No         Examination chaperoned by 08/02/20 LPN  Assessment:     1. Encounter for gynecological examination with Papanicolaou smear of cervix Pap sent Pap in 3 years if normal Physical and labs with PCP Get mammogram   2. Encounter for  screening fecal occult blood testing  3. Screening examination for STD (sexually transmitted disease) Check HIV,RPR and hepatis C antibody   4. Menorrhagia with irregular cycle -will get pelvic Faith Rogue at [facility] 06/08/20 at 11:30 am  5. Dysmenorrhea Will get pelvic 08/06/20   6. Anxiety and depression She is on meds and said PCP is sending her to see therapist     Plan:     Pap in 3 years if normal Will talk when [place] results back

## 2020-06-04 NOTE — Telephone Encounter (Signed)
PSG 05/23/20 >> AHI 6.7, SpO2 low 83%.  REM AHI 32.4.   Please let her know that her sleep study shows mild sleep apnea.  Please make sure she gets set up with a DME in [place] to get CPAP supplies.

## 2020-06-04 NOTE — Procedures (Signed)
    Patient Name: Elizabeth Bautista, Elizabeth Bautista Date: 05/23/2020 Gender: Female D.O.B: 09-Nov-1975 Age (years): 4 Referring Provider: Coralyn Helling MD, ABSM Height (inches): 65 Interpreting Physician: Coralyn Helling MD, ABSM Weight (lbs): 287 RPSGT: Peak, Robert BMI: 48 MRN: [id number] Neck Size: 18.00  CLINICAL INFORMATION Sleep Study Type: NPSG  Indication for sleep study: snoring, sleep disruption, witnessed apnea, and daytime sleepiness.  SLEEP STUDY TECHNIQUE As per the AASM Manual for the Scoring of Sleep and Associated Events v2.3 (April 2016) with a hypopnea requiring 4% desaturations.  The channels recorded and monitored were frontal, central and occipital EEG, electrooculogram (EOG), submentalis EMG (chin), nasal and oral airflow, thoracic and abdominal wall motion, anterior tibialis EMG, snore microphone, electrocardiogram, and pulse oximetry.  MEDICATIONS Medications self-administered by patient taken the night of the study : N/A  SLEEP ARCHITECTURE The study was initiated at 10:47:12 PM and ended at 4:57:31 AM.  Sleep onset time was 67.6 minutes and the sleep efficiency was 70.3%. The total sleep time was 260.3 minutes.  Stage REM latency was 103.5 minutes.  The patient spent 4.14% of the night in stage N1 sleep, 76.65% in stage N2 sleep, 0.00% in stage N3 and 19.2% in REM.  Alpha intrusion was absent.  Supine sleep was 0.00%.  RESPIRATORY PARAMETERS The overall apnea/hypopnea index (AHI) was 6.7 per hour. There were 3 total apneas, including 2 obstructive, 1 central and 0 mixed apneas. There were 26 hypopneas and 0 RERAs.  The AHI during Stage REM sleep was 32.4 per hour.  AHI while supine was N/A per hour.  The mean oxygen saturation was 94.44%. The minimum SpO2 during sleep was 83.00%.  moderate snoring was noted during this study.  CARDIAC DATA The 2 lead EKG demonstrated sinus rhythm. The mean heart rate was 82.85 beats per minute. Other EKG findings include:  None.  LEG MOVEMENT DATA The total PLMS were 0 with a resulting PLMS index of 0.00. Associated arousal with leg movement index was 0.0 .  IMPRESSIONS - Mild obstructive sleep apnea with an AHI of 6.7 and SpO2 low of 83%.  She had significant REM effect with REM AHI of 32.4.  DIAGNOSIS - Obstructive Sleep Apnea (G47.33)  RECOMMENDATIONS - Additional treatment options include CPAP, oral appliance, or surgical assessment. - Avoid alcohol, sedatives and other CNS depressants that may worsen sleep apnea and disrupt normal sleep architecture. - Sleep hygiene should be reviewed to assess factors that may improve sleep quality. - Weight management and regular exercise should be initiated or continued if appropriate.  [Electronically signed] 06/04/2020 09:39 AM  Coralyn Helling MD, ABSM Diplomate, American Board of Sleep Medicine   NPI: [id number]

## 2020-06-04 NOTE — Telephone Encounter (Signed)
Patient returned phone call, writer went over Sleep study result per Dr Craige Cotta. All questions answered and patient expressed understanding. Order placed for mask and supplies per Dr Craige Cotta. Patient aware. Confirmed upcoming scheduled appointment for 06/11/2020 at 9:15am at the [facility] office. Nothing further needed at this time.

## 2020-06-04 NOTE — Addendum Note (Signed)
Addended by: Melonie [place] on: 06/04/2020 11:31 AM   Modules accepted: Orders

## 2020-06-05 ENCOUNTER — Other Ambulatory Visit: Payer: Self-pay | Admitting: *Deleted

## 2020-06-05 ENCOUNTER — Telehealth: Payer: Self-pay | Admitting: Allergy & Immunology

## 2020-06-05 MED ORDER — CETIRIZINE HCL 10 MG PO TABS: 10.0000 mg | ORAL_TABLET | Freq: Every day | ORAL | 5 refills | Status: DC

## 2020-06-05 MED ORDER — MONTELUKAST SODIUM 10 MG PO TABS: 10.0000 mg | ORAL_TABLET | Freq: Every day | ORAL | 5 refills | Status: DC

## 2020-06-05 NOTE — Telephone Encounter (Signed)
Pharmacy sent over an electronic prescription request for zyrtec which was filled. I have sent in the montelukast to the requested pharmacy, patient has been informed.

## 2020-06-05 NOTE — Telephone Encounter (Signed)
Patient called said her meds were not call in the zyetec and singulair. [facility] [place] [phone]

## 2020-06-06 ENCOUNTER — Telehealth: Payer: Self-pay | Admitting: Adult Health

## 2020-06-06 LAB — HEPATITIS C ANTIBODY: Hep C Virus Ab: <0.1 s/co ratio (ref 0.0–0.9)

## 2020-06-06 LAB — RPR, QUANT+TP ABS (REFLEX)
Rapid Plasma Reagin, Quant: 1:2 {titer} — ABNORMAL HIGH
T Pallidum Abs: NONREACTIVE

## 2020-06-06 LAB — HIV ANTIBODY (ROUTINE TESTING W REFLEX): HIV Screen 4th Generation wRfx: NONREACTIVE

## 2020-06-06 LAB — RPR: RPR Ser Ql: REACTIVE — AB

## 2020-06-06 NOTE — Telephone Encounter (Signed)
Patient called stating that she seen on her Mychart the results of her Blood work and she is very worried about one of the results. Pt states she needs a phone call as soon as possible. Please contact pt

## 2020-06-06 NOTE — Telephone Encounter (Signed)
[name] to call pt. Elizabeth Bautista

## 2020-06-06 NOTE — Telephone Encounter (Signed)
Patient called and wants a nurse to return her phone call to discuss test results. Clinical staff will follow up with patient.

## 2020-06-07 ENCOUNTER — Telehealth: Payer: Self-pay | Admitting: Pulmonary Disease

## 2020-06-07 DIAGNOSIS — G4733 Obstructive sleep apnea (adult) (pediatric): Secondary | ICD-10-CM

## 2020-06-07 NOTE — Telephone Encounter (Signed)
Called and spoke with pt and she stated that her cpap machine is broken and she did get this machine from the DME in [place].  [facility] advised her that they could not fix or even look at this machine and that we would need to send a new order over for her to get a new machine with supplies.  VS please advise. Thanks

## 2020-06-07 NOTE — Telephone Encounter (Signed)
Please send order to arrange for new auto CPAP with pressure range 5 to 15 cm H2O with heated humidity and mask of choice.

## 2020-06-07 NOTE — Telephone Encounter (Signed)
Explained to Clarise that RPR was reactive, which is screening test and that T pallidum Abs Non reactive, so does not have syphilis

## 2020-06-07 NOTE — Telephone Encounter (Signed)
DME order placed per Dr. Evlyn Courier recommendations. Called and spoke with Patient.  Patient aware cpap order has been placed with [facility]. Patient is aware to schedule OV once cpap has been received for compliance.  Nothing further at this time.

## 2020-06-08 ENCOUNTER — Telehealth: Payer: Self-pay

## 2020-06-08 ENCOUNTER — Ambulatory Visit (HOSPITAL_COMMUNITY): Payer: Medicaid Other

## 2020-06-08 ENCOUNTER — Telehealth: Payer: Self-pay | Admitting: Pulmonary Disease

## 2020-06-08 MED ORDER — CETIRIZINE HCL 10 MG PO TABS: 10.0000 mg | ORAL_TABLET | Freq: Every day | ORAL | 5 refills | Status: DC

## 2020-06-08 MED ORDER — MONTELUKAST SODIUM 10 MG PO TABS: 10.0000 mg | ORAL_TABLET | Freq: Every day | ORAL | 5 refills | Status: DC

## 2020-06-08 MED ORDER — BREZTRI AEROSPHERE 160-9-4.8 MCG/ACT IN AERO: 2.0000 | INHALATION_SPRAY | Freq: Two times a day (BID) | RESPIRATORY_TRACT | 0 refills | Status: DC

## 2020-06-08 NOTE — Telephone Encounter (Signed)
Medications didn't get sent to the pharmacy.

## 2020-06-10 LAB — CYTOLOGY - PAP
Adequacy: ABSENT
Comment: NEGATIVE
Diagnosis: NEGATIVE
High risk HPV: NEGATIVE

## 2020-06-11 ENCOUNTER — Ambulatory Visit: Payer: Medicaid Other | Admitting: Pulmonary Disease

## 2020-06-11 NOTE — Telephone Encounter (Signed)
Order in for CPAP- will forward to [facility] to help with this, as pt wants to try another DME for her CPAP. Thanks!

## 2020-06-12 ENCOUNTER — Encounter: Payer: Self-pay | Admitting: Adult Health

## 2020-06-12 ENCOUNTER — Other Ambulatory Visit: Payer: Self-pay

## 2020-06-12 ENCOUNTER — Ambulatory Visit (INDEPENDENT_AMBULATORY_CARE_PROVIDER_SITE_OTHER): Payer: Medicaid Other | Admitting: Adult Health

## 2020-06-12 VITALS — BP 161/87 | HR 90 | Ht 65.0 in | Wt 290.0 lb

## 2020-06-12 DIAGNOSIS — R569 Unspecified convulsions: Secondary | ICD-10-CM

## 2020-06-12 MED ORDER — GABAPENTIN 100 MG PO CAPS: 200.0000 mg | ORAL_CAPSULE | Freq: Three times a day (TID) | ORAL | 3 refills | Status: DC

## 2020-06-12 NOTE — Telephone Encounter (Signed)
I called the patient.  Advised about the delay in cpaps.  Pt verbalized understanding & states they do not wish to switch DMEs at this time. Nothing further needed at this time.

## 2020-06-12 NOTE — Progress Notes (Addendum)
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 She returns today for follow-up. The patient states that approximately 3 weeks ago she had several seizure events. She states that over time these events have changed. She states that she is now having episodes of bladder and bowel incontinence with the episodes. At the last visit ambulatory EEG was ordered however insurance did not approve. The patient was admitted to the hospital in October for asthma during that hospitalization she had an EEG that was normal. The patient was also put on gabapentin 100 mg 3 times a day for discomfort that she experienced after seizure events. She reports that it has been beneficial but not resolved her discomfort. She returns today for an evaluation.  HISTORY (copied from Dr. 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    HPI:  Elizabeth Bautista is a 45 y.o. female here as requested by Doree Albee, MD for seizure. PMHx sleep apnea, hypertension, type 2 diabetes, rheumatoid arthritis, depression, class III severe obesity with serious comorbidity and body mass index of 45-49, asthma, seizures.  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   She sates these episodes are becoming more aggressive, they cause her pain, she is not driving. They are random. These are different than the ones in 2019 but similar to the ones in 2007 from [place]. The last one she had she was foaming, she "blacks out", she wakes up and her whole body is stiff and she she has bad headaches. 2 times she threw up. The current episode started in May of this year which are different these "new ones" that started this year but she has been having episodes for years especially since 2019 (from 2012-2019). These new episodes are different.  She has had 2 in May, June had 1, none in July, august she had 4 becoming more frequent and more severe. The one last week lasted 6 minutes. She does not remember the episodes, the last one her blood pressure wa elevated, she just "blacked out", she was watching TV, was on the bed, (we can't call him because he is sleeping), she started "seizing"  And son explained it to patient that he noticed she leaned over, she started shaking but it look different from the "other" seizures and it seemed like she was choking, she started throwing up and he held up her head, she opened her eyes and she kept saying "what happened". She was diagnosed at [facility] in [place] in 2007 around 2008 until 2014-2015 when she moved to [place]. She has family history of cousins on mom's side. She doesn't remember what kind of seizures she was diagnosed with in May. She was allergic  one medication they used and don;t remember which one they used. She will lose her urine and defecate on herself. These are similar to the seizures she had in May. And she says she knows the difference between pseudoseizures and seizures. No other focal neurologic deficits, associated symptoms, inciting events or modifiable factors.  I reviewed Dr. Lanice Shirts notes, it appears on September 13 she left a voicemail that she was in  a great deal of pain due to her seizures and she had recently, total of 3, she was found on the floor by son, unknown how long she was out, and in a prior recent day her daughter heard her hit the floor when she was in the bathroom, she had to, the length was 3 minutes for the first 1 and 8 minutes for the last 1, she called for pain and refill.  Dr. Anastasio Champion noted she was on tramadol and this is contraindicated with seizures, he would also not prescribe opioids due to this as well, patient complained that her pain was severe.  Dr. Lanice Shirts team sent her here for evaluation of these reported seizures.  She reported that she has a history of seizures but she has not been to a neurologist since she saw one in [place], I do not have those records.  She is also been seen by Jeralyn Ruths, NP who also agreed that tramadol and other opioids would decrease the seizure threshold and when not recommended.  They recommended Tylenol.  She continued to call stating that she was in so much pain she cannot get out of bed, hurting all over, especially in the back of her legs, Dr. Irma Newness and his team have provided thorough evaluations, referred her to neurology, bariatrics, rheumatology and started evaluating her for reported pain in the back of her legs.  She also reported significant fatigue and she was sent for emergent evaluation, she was seen at [facility] urgent care center September 16, Dr. Anastasio Champion addressed her abnormal labs from urgent care: She was negative for COVID-19, her potassium levels were slightly low, ALT was increased at 36, AST was normal, and she was scheduled for follow-up office.  On September 20 and was seen in their office, she reported 10 out of 10 excruciating pain, back and legs for about 2 years, failed prednisone in the past, physical therapy, burning pain, pain radiates down the bilateral legs, weakness in the legs, and they treated her with ibuprofen and Tylenol and they ordered an MRI to  pinpoint etiology of pain.  She is due to see rheumatology for her rheumatoid arthritis and she is on Plaquenil.  Reviewed notes, labs and imaging from outside physicians, which showed:  CMP with elevated glucose otherwise normal February 23 2020.  TSH normal February 16, 2020.  I reviewed MRI of the brain report from the hospital encounter in June 14, 2019:  Hemorrhage: No intracranial hemorrhage.  Brain: Gray-white matter differentiation appears appropriate. Cisterns  and sulci are intact.. There is no mass or midline shift. No restricted  diffusion to suggest acute infarction. The corpus callosum, tectum,  pituitary sella and posterior fossa are unremarkable. There is no  abnormal enhancement within the brain parenchyma.  Ventricles: No hydrocephalus.  Vasculature: There is appropriate signal void within the central  intracranial vasculature.  Bones: Unremarkable.  Paranasal sinuses: Clear.  IMPRESSION: Unremarkable MRI ofthe brain.  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   REVIEW OF SYSTEMS: Out of a complete 14 system review of symptoms, the patient complains only of the following symptoms, and all other reviewed systems are negative.  ALLERGIES: Allergies  Allergen Reactions  . Phenytoin Anaphylaxis    Other reaction(s): swelling and heart rate decreased  . Phenylbutazones     HOME MEDICATIONS: Outpatient Medications Prior to Visit  Medication Sig Dispense Refill  . albuterol (PROAIR HFA) 108 (90 Base) MCG/ACT inhaler 2 puffs every 4 hours as needed only  if your can't catch your breath 18 g 11  .  amLODipine (NORVASC) 5 MG tablet Take 1 tablet (5 mg total) by mouth daily. 30 tablet 3  . blood glucose meter kit and supplies Dispense based on patient and insurance preference. Use up to four times daily as directed. (FOR ICD-10 E10.9, E11.9). 1 each 0  . Blood Pressure Monitoring (SPHYGMOMANOMETER) MISC 1 each by Does not apply route daily. 1 each 0  . Budeson-Glycopyrrol-Formoterol (BREZTRI AEROSPHERE) 160-9-4.8 MCG/ACT AERO Inhale 2 puffs into the lungs 2 (two) times daily. 5.9 g 0  . busPIRone (BUSPAR) 7.5 MG tablet Take 7.5 mg by mouth 2 (two) times daily.    . cetirizine (ZYRTEC) 10 MG tablet Take 1 tablet (10 mg total) by mouth daily. 30 tablet 5  . Cholecalciferol 1.25 MG (50000 UT) TABS Take 1 tablet by mouth daily. 90 tablet 0  . cyclobenzaprine (FLEXERIL) 10 MG tablet Take 10 mg by mouth 3 (three) times daily as needed for muscle spasms.     Marland Kitchen escitalopram (LEXAPRO) 20 MG tablet Take 1 tablet by mouth every evening.    . famotidine (PEPCID) 20 MG tablet Take 20 mg by mouth daily.    . ferrous sulfate 325 (65 FE) MG tablet Take 325 mg by mouth 2 (two) times daily.    . fluticasone (CUTIVATE) 0.005 % ointment Apply 1 application topically daily.    . fluticasone (FLONASE) 50 MCG/ACT nasal spray Place 1 spray into both nostrils daily. 16 g 5  . furosemide (LASIX) 20 MG tablet Take 20 mg by mouth daily.    Marland Kitchen gabapentin (NEURONTIN) 100 MG capsule Take 1 capsule (100 mg total) by mouth 3 (three) times daily. 90 capsule 3  . hydrochlorothiazide (HYDRODIURIL) 25 MG tablet Take 1 tablet (25 mg total) by mouth daily. For high blood pressure. 90 tablet 0  . hydroxychloroquine (PLAQUENIL) 200 MG tablet Take 1 tablet (200 mg total) by mouth 2 (two) times daily. 60 tablet 1  . hydrOXYzine (ATARAX/VISTARIL) 25 MG tablet Take 25 mg by mouth 2 (two) times daily.    Marland Kitchen ibuprofen (ADVIL) 800 MG tablet Take 1 tablet (800 mg total) by mouth every 8 (eight) hours as needed. 30 tablet 0  . KLOR-CON M20 20  MEQ tablet TAKE 1 TABLET BY MOUTH DAILY ONLY WHEN YOU TAKE FUROSEMIDE. (Patient taking differently: 20 mEq daily. Only when you take Furosemide.) 30 tablet 3  . metoprolol tartrate (LOPRESSOR) 50 MG tablet TAKE 1 TABLET 2 HR PRIOR TO CARDIAC PROCEDURE 1 tablet 0  . mirtazapine (REMERON) 30 MG tablet Take 30 mg by mouth at bedtime.    . montelukast (SINGULAIR) 10 MG tablet Take 1 tablet (10 mg total) by mouth at bedtime. 30 tablet  5  . pantoprazole (PROTONIX) 40 MG tablet Take 1 tablet (40 mg total) by mouth daily. Take 30-60 min before first meal of the day 30 tablet 2  . prazosin (MINIPRESS) 1 MG capsule Take 1 mg by mouth at bedtime.    Marland Kitchen QUEtiapine (SEROQUEL) 25 MG tablet Take 25 mg by mouth 2 (two) times daily.     Marland Kitchen levETIRAcetam (KEPPRA) 500 MG tablet Take 1 tablet (500 mg total) by mouth 2 (two) times daily. 60 tablet 2   No facility-administered medications prior to visit.    PAST MEDICAL HISTORY: Past Medical History:  Diagnosis Date  . Anemia   . Asthma   . Diabetes ([place])    on Metformin  . Eczema   . Edema   . 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 HTN (hypertension)   . Rheumatoid arthritis ([place])    on Plaquenil   . Seizures ([place])    last seizure 2019  . Sleep apnea   . Sleep apnea     PAST SURGICAL HISTORY: Past Surgical History:  Procedure Laterality Date  . APPENDECTOMY    . CESAREAN SECTION     x3    FAMILY HISTORY: Family History  Problem Relation Age of Onset  . High blood pressure Mother   . Diabetes Father   . Diabetes Sister   . Heart Problems Sister   . Diabetes Brother   . Diabetes Paternal Grandmother   . Diabetes Other        father's side "everybody died from Diabetes"  . High blood pressure Other        mother's side, multiple siblings with this   . Heart attack Other        family member on mother's side   . Diabetes Paternal Aunt   . Seizures  Cousin        not sibings to the other cousins with seizures  . Seizures Cousin        not sibings to the other cousins with seizures  . Seizures Cousin        not sibings to the other cousins with seizures  . Cervical cancer Maternal Aunt   . Breast cancer Cousin   . Dementia Maternal Aunt   . Asthma Maternal Aunt   . Heart Problems Maternal Grandmother   . Asthma Child   . Asthma Child   . Asthma Daughter   . Angioedema Daughter   . Asthma Son     SOCIAL HISTORY: Social History   Socioeconomic History  . Marital status: Single    Spouse name: Not on file  . Number of children: 5  . Years of education: Not on file  . Highest education level: 9th grade  Occupational History  . Not on file  Tobacco Use  . Smoking status: Former Smoker    Years: 10.00    Types: Cigarettes    Quit date: 06/02/2014    Years since quitting: 6.0  . Smokeless tobacco: Never Used  . Tobacco comment: during the 10 years of smoking, smoked 2-3 cigarettes/day  Vaping Use  . Vaping Use: Never used  Substance and Sexual Activity  . Alcohol use: Not Currently  . Drug use: Not Currently  . Sexual activity: Not Currently    Birth control/protection: Abstinence  Other Topics Concern  . Not on file  Social History Narrative   Divorced.Lives with  3 kids.Originally from [place].Came from [place] 7 months ago.      02/27/2020   Right handed   Caffeine: none    Social Determinants of Health   Financial Resource Strain: High Risk  . Difficulty of Paying Living Expenses: Very hard  Food Insecurity: Food Insecurity Present  . Worried About Charity fundraiser in the Last Year: Often true  . Ran Out of Food in the Last Year: Often true  Transportation Needs: No Transportation Needs  . Lack of Transportation (Medical): No  . Lack of Transportation (Non-Medical): No  Physical Activity: Inactive  . Days of Exercise per Week: 0 days  . Minutes of Exercise per Session: 0 min  Stress: Stress Concern  Present  . Feeling of Stress : Very much  Social Connections: Socially Isolated  . Frequency of Communication with Friends and Family: Once a week  . Frequency of Social Gatherings with Friends and Family: Never  . Attends Religious Services: Never  . Active Member of Clubs or Organizations: No  . Attends Archivist Meetings: Never  . Marital Status: Divorced  Human resources officer Violence: Not At Risk  . Fear of Current or Ex-Partner: No  . Emotionally Abused: No  . Physically Abused: No  . Sexually Abused: No      PHYSICAL EXAM  Vitals:   06/12/20 0831  BP: (!) 161/87  Pulse: 90  Weight: 290 lb (131.5 kg)  Height: _0  (1.651 m)   Body mass index is 48.26 kg/m.  Generalized: Well developed, in no acute distress   Neurological examination  Mentation: Alert oriented to time, place, history taking. Follows all commands speech and language fluent Cranial nerve II-XII: Pupils were equal round reactive to light. Extraocular movements were full, visual field were full on confrontational test. Facial sensation and strength were normal. Uvula tongue midline. Head turning and shoulder shrug  were normal and symmetric. Motor: The motor testing reveals 5 over 5 strength of all 4 extremities. Good symmetric motor tone is noted throughout.  Sensory: Sensory testing is intact to soft touch on all 4 extremities. No evidence of extinction is noted.  Coordination: Cerebellar testing reveals good finger-nose-finger and heel-to-shin bilaterally. Tremor noted in the upper extremities left greater than right although tremor appears to be functional. Gait and station: Patient uses a cane when ambulating. Tandem gait not attempted. Reflexes: Deep tendon reflexes are symmetric and normal bilaterally.   DIAGNOSTIC DATA (LABS, IMAGING, TESTING) - I reviewed patient records, labs, notes, testing and imaging myself where available.  Lab Results  Component Value Date   WBC 10.3 05/10/2020    HGB 10.8 (L) 05/10/2020   HCT 36.4 05/10/2020   MCV 73.4 (L) 05/10/2020   PLT 220 05/10/2020      Component Value Date/Time   NA 136 05/10/2020 1730   NA 142 04/16/2020 1153   K 3.5 05/10/2020 1730   CL 103 05/10/2020 1730   CO2 25 05/10/2020 1730   GLUCOSE 91 05/10/2020 1730   BUN 8 05/10/2020 1730   BUN 7 04/16/2020 1153   CREATININE 0.52 05/10/2020 1730   CREATININE 0.71 02/23/2020 0941   CALCIUM 8.2 (L) 05/10/2020 1730   PROT 6.7 05/10/2020 1730   PROT 7.3 02/16/2020 1600   ALBUMIN 3.5 05/10/2020 1730   ALBUMIN 4.6 02/16/2020 1600   AST 15 05/10/2020 1730   ALT 18 05/10/2020 1730   ALKPHOS 62 05/10/2020 1730   BILITOT 0.6 05/10/2020 1730   BILITOT 0.3 02/16/2020 1600  GFRNONAA >60 05/10/2020 1730   GFRNONAA 103 02/23/2020 0941   GFRAA 121 04/16/2020 1153   GFRAA 119 02/23/2020 0941   Lab Results  Component Value Date   CHOL 212 (H) 01/18/2020   HDL 82 01/18/2020   LDLCALC 111 (H) 01/18/2020   TRIG 88 01/18/2020   CHOLHDL 2.6 01/18/2020   Lab Results  Component Value Date   HGBA1C 6.9 (H) 01/18/2020   No results found for: VITAMINB12 Lab Results  Component Value Date   TSH 1.050 02/16/2020      ASSESSMENT AND PLAN 45 y.o. year old female  has a past medical history of Anemia, Asthma, Diabetes ([place]), Eczema, Edema, Heart problem, HTN (hypertension), Rheumatoid arthritis ([place]), Seizures ([place]), Sleep apnea, and Sleep apnea. here with:  1. Seizures-nonepileptic events  --We will increase gabapentin to 200 mg 3 times a day for discomfort that she experiences after seizure events. -- Discussed with Dr. Jaynee Eagles.  The patient has had a thorough work-up while in [place] and was diagnosed with pseudoseizures.  Her recent EEG in the hospital was also normal.  Not sure that we have more to offer. -- We will send back to PCP for management of gabapentin--PCP can consider possible referral to psychiatry for evaluation as well -- Follow-up on as-needed basis   I  spent 30 minutes of face-to-face and non-face-to-face time with patient.  This included previsit chart review, lab review, study review, order entry, electronic health record documentation, patient education.  Ward Givens, MSN, NP-C 06/12/2020, 8:42 AM [facility] [address] [place], [place] [address] [phone]  Made any corrections needed, and agree with history, physical, neuro exam,assessment and plan as stated.     Sarina Ill, MD [facility]

## 2020-06-12 NOTE — Patient Instructions (Signed)
Your Plan:  Increase gabapentin to 200 mg three times a day Will discuss with Dr. Lucia Gaskins about prolong EEG or admission for seizure monitoring If your symptoms worsen or you develop new symptoms please let us know.    Thank you for coming to see [place] at [facility]. I hope we have been able to provide you high quality care today.  You may receive a patient satisfaction survey over the next few weeks. We would appreciate your feedback and comments so that we may continue to improve ourselves and the health of our patients.

## 2020-06-12 NOTE — Progress Notes (Signed)
Please send this note to her new primary care physician.  She was dismissed from my practice a few months ago.  Thanks.

## 2020-06-13 ENCOUNTER — Telehealth: Payer: Self-pay

## 2020-06-13 ENCOUNTER — Telehealth: Payer: Self-pay | Admitting: *Deleted

## 2020-06-13 NOTE — Telephone Encounter (Signed)
I spoke to pt and relayed the message form MM/NP after speaking with Dr. Lucia Gaskins that no additional EEG needs to be done. She can be seen as as needed basis and no appt at this time. She had no appt scheduled.    Pt noted that this information was not to be given to her previous pcp Dr. Karilyn Cota and was and was not comfortable that this was done.  I would let MM/NP know and assured her that the  information with new pcp was listed and did not show Dr. Karilyn Cota at all in the care team for her.  It may have been due to her being your referring physician when she was first seen and again assured that she was not on the care team as a pcp provider or referring provider.

## 2020-06-13 NOTE — Telephone Encounter (Signed)
-----   Message from Butch Penny, NP sent at 06/12/2020 11:11 AM EST ----- Andrey Campanile: please call patient and let them know I spoke to Dr. Lucia Gaskins: At this time doing additional EEGs is not warranted as she recently had a normal EEG and her EEG from previous MD was normal.  Please advise that we are sending back to her PCP with our recommendations.  She can follow-up on an as-needed basis.  If appointment is already scheduled it can be canceled

## 2020-06-13 NOTE — Telephone Encounter (Signed)
Patient is calling to check on her 3 referrals. Please contact patient at [phone].

## 2020-06-14 NOTE — Progress Notes (Signed)
Last ofv not sent to pt new pcp Dr Patterson Hammersmith.  Confirmation received.

## 2020-06-15 ENCOUNTER — Ambulatory Visit (HOSPITAL_COMMUNITY): Payer: Medicaid Other

## 2020-06-15 ENCOUNTER — Telehealth (HOSPITAL_COMMUNITY): Payer: Self-pay | Admitting: *Deleted

## 2020-06-15 ENCOUNTER — Other Ambulatory Visit (HOSPITAL_COMMUNITY): Payer: Self-pay | Admitting: *Deleted

## 2020-06-15 ENCOUNTER — Encounter (HOSPITAL_COMMUNITY): Payer: Self-pay

## 2020-06-15 DIAGNOSIS — R072 Precordial pain: Secondary | ICD-10-CM

## 2020-06-15 NOTE — Telephone Encounter (Signed)
Reaching out to patient to offer assistance regarding upcoming cardiac imaging study; pt verbalizes understanding of appt date/time, parking situation and where to check in, pre-test NPO status and medications ordered, and verified current allergies; name and call back number provided for further questions should they arise  Larey Brick RN Navigator Cardiac Imaging [facility] [phone] office [phone] cell  MyChart message also sent to pt with instructions per pt request.  Pt states she does not drive due to seizures and will be unable to obtain blood work prior to test.

## 2020-06-15 NOTE — Progress Notes (Signed)
BMET ordered placed for upcoming cardiac CT scan.  Larey Brick, RN Navigator, Cardiac Imaging  Office: [phone]

## 2020-06-17 ENCOUNTER — Other Ambulatory Visit (INDEPENDENT_AMBULATORY_CARE_PROVIDER_SITE_OTHER): Payer: Self-pay | Admitting: Internal Medicine

## 2020-06-17 DIAGNOSIS — R609 Edema, unspecified: Secondary | ICD-10-CM

## 2020-06-19 ENCOUNTER — Other Ambulatory Visit: Payer: Self-pay

## 2020-06-19 ENCOUNTER — Ambulatory Visit (HOSPITAL_COMMUNITY)
Admission: RE | Admit: 2020-06-19 | Discharge: 2020-06-19 | Disposition: A | Discharge status: Home / Self Care | Payer: Medicaid Other | Source: Ambulatory Visit | Attending: Cardiology | Admitting: Cardiology

## 2020-06-19 DIAGNOSIS — R072 Precordial pain: Secondary | ICD-10-CM | POA: Insufficient documentation

## 2020-06-19 LAB — POCT I-STAT CREATININE: Creatinine, Ser: 0.5 mg/dL (ref 0.44–1.00)

## 2020-06-19 IMAGING — CT CT HEART MORP W/ CTA COR W/ SCORE W/ CA W/CM &/OR W/O CM
2 of 8 series · 4 of 20 positions shown, 5 images · IV contrast (APPLIED)
Comparison: None.
COMPARISON: None.

Addendum:
EXAM:
OVER-READ INTERPRETATION  CT CHEST

The following report is an over-read performed by radiologist Dr.
MOATSHE [REDACTED] on [DATE]. This
over-read does not include interpretation of cardiac or coronary
anatomy or pathology. The coronary calcium score/coronary CTA
interpretation by the cardiologist is attached.
HISTORY: Chest pain, nonspecific chest pain
Cardiac/Coronary CT
TECHNIQUE: The patient was scanned on a Siemens Force scanner.
PROTOCOL: A 110 kV prospective scan was triggered in the descending thoracic
aorta at 111 HU's. Axial non-contrast 3 mm slices were carried out
through the heart. The data set was analyzed on a dedicated work
station and scored using the Agatson method. Gantry rotation speed
was 250 msecs and collimation was 0.6 mm. Beta blockade and 0.8 mg
of sl NTG was given. The 3D data set was reconstructed in 5%
intervals of 35-75% of the R-R cycle. Diastolic phases were analyzed
on a dedicated work station using MPR, MIP and VRT modes. The
patient received 100mL OMNIPAQUE IOHEXOL 350 MG/ML SOLN of contrast.

[Series 9: ts syst sharp 45 % · axial · 0.39mm/px · z∈[+544,+585]mm · 2 of 308 slices shown]
[im 103/308  lung]
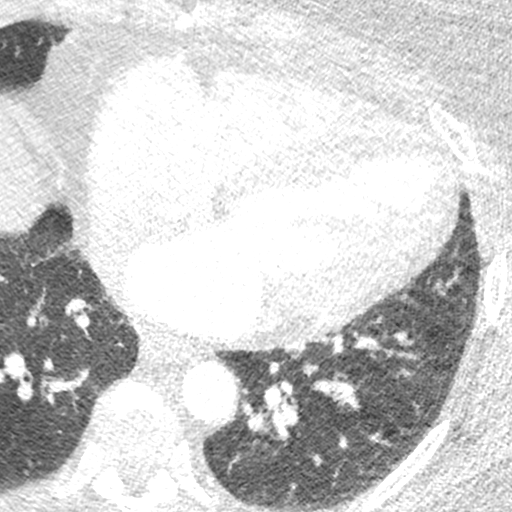
[im 205/308  lung]
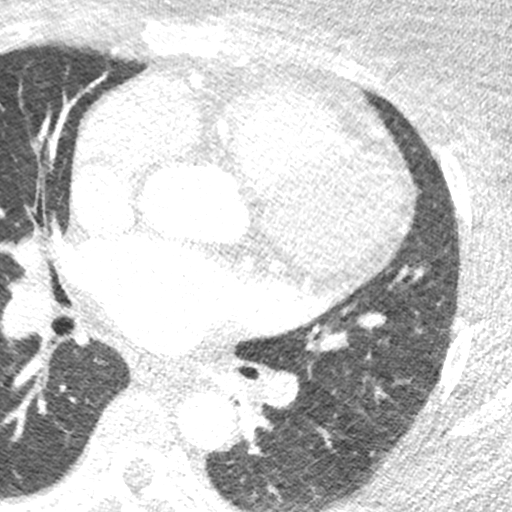

[Series 13: best diast manual · axial · 0.39mm/px · z∈[+544,+585]mm · 2 of 308 slices shown, 3 images]
[im 103/308  vessel]
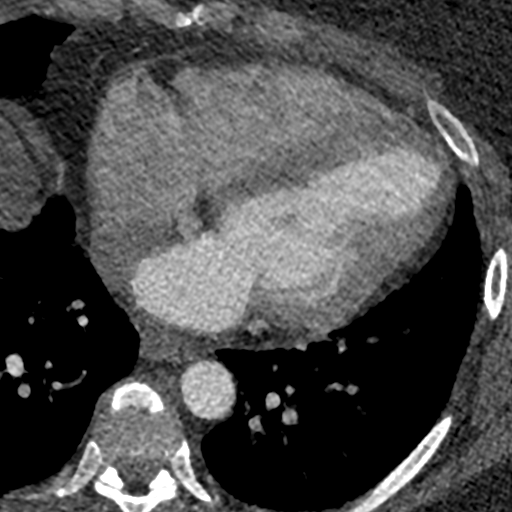
[im 103/308  lung]
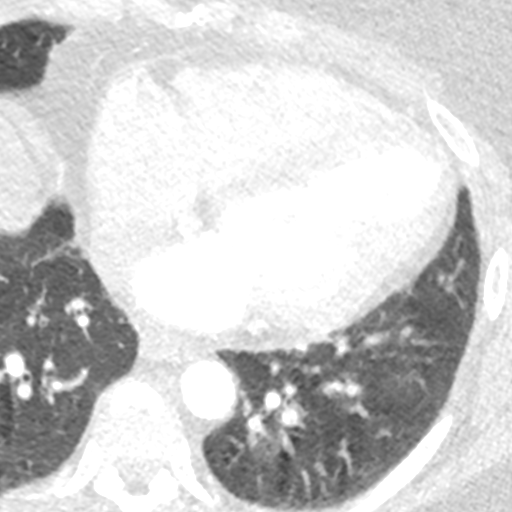
[im 205/308  vessel]
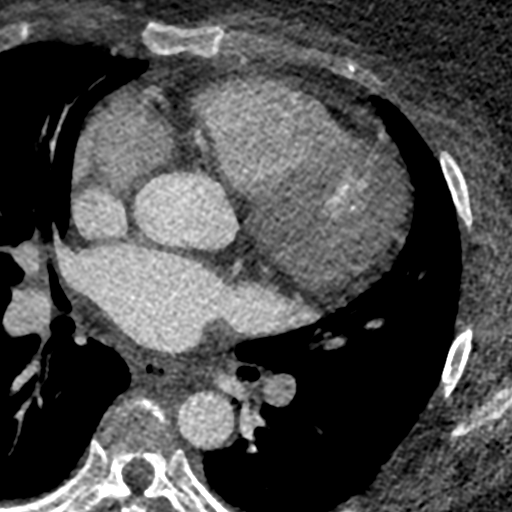

[4 of 20 positions shown; findings below may reference images not displayed]

FINDINGS: Within the visualized portions of the thorax there are no suspicious
appearing pulmonary nodules or masses, there is no acute
consolidative airspace disease, no pleural effusions, no
pneumothorax and no lymphadenopathy. Visualized portions of the
upper abdomen demonstrates diffuse low attenuation throughout the
visualized hepatic parenchyma, indicative of hepatic steatosis.
There are no aggressive appearing lytic or blastic lesions noted in
the visualized portions of the skeleton.
IMPRESSION: 1. Hepatic steatosis.
FINDINGS: Coronary calcium score: The patient's coronary artery calcium score
is 0, which places the patient in the 0 percentile.

Coronary arteries: Normal coronary origins. Dominance unable to be
determined.

Right Coronary Artery: Normal caliber vessel, not well visualized
distally. No significant plaque or stenosis.

Left Main Coronary Artery: Normal caliber vessel. No significant
plaque or stenosis.

Left Anterior Descending Coronary Artery: Normal caliber vessel. No
significant plaque or stenosis. Gives rise to large D1 branch.

Left Circumflex Artery: Incompletely visualized due to poor contrast
opacification. Proximal LCx appears without stenosis. Mid and distal
portions not well visualized

Aorta: Normal size, 35 mm at the mid ascending aorta (level of the
PA bifurcation) measured double oblique. No calcifications. No
dissection.

Aortic Valve: No calcifications. Trileaflet.

Other findings:

Normal pulmonary vein drainage into the left atrium.

Normal left atrial appendage without a thrombus.

Normal size of the pulmonary artery.

Low quality contrast timing
IMPRESSION: 1. No evidence of CAD, CADRADS = 0. Caveat to this is that poor
contrast opacification limits interpretation of most of left
circumflex as well as distal RCA

2. Coronary calcium score of 0. This was 0 percentile for age and
sex matched control.

3. Normal coronary origins. Unable to determine dominance based on
this study.

*** End of Addendum ***
EXAM:
OVER-READ INTERPRETATION  CT CHEST

The following report is an over-read performed by radiologist Dr.
MOATSHE [REDACTED] on [DATE]. This
over-read does not include interpretation of cardiac or coronary
anatomy or pathology. The coronary calcium score/coronary CTA
interpretation by the cardiologist is attached.
FINDINGS: Within the visualized portions of the thorax there are no suspicious
appearing pulmonary nodules or masses, there is no acute
consolidative airspace disease, no pleural effusions, no
pneumothorax and no lymphadenopathy. Visualized portions of the
upper abdomen demonstrates diffuse low attenuation throughout the
visualized hepatic parenchyma, indicative of hepatic steatosis.
There are no aggressive appearing lytic or blastic lesions noted in
the visualized portions of the skeleton.
IMPRESSION: 1. Hepatic steatosis.

## 2020-06-19 MED ORDER — IOHEXOL 350 MG/ML SOLN
100.0000 mL | Freq: Once | INTRAVENOUS | Status: AC | PRN
  Administered 2020-06-19: 100 mL via INTRAVENOUS

## 2020-06-19 MED ORDER — METOPROLOL TARTRATE 5 MG/5ML IV SOLN
INTRAVENOUS | Status: AC
  Filled 2020-06-19: qty 15

## 2020-06-19 MED ORDER — NITROGLYCERIN 0.4 MG SL SUBL
SUBLINGUAL_TABLET | SUBLINGUAL | Status: AC
  Administered 2020-06-19: 0.8 mg via SUBLINGUAL
  Filled 2020-06-19: qty 2

## 2020-06-19 MED ORDER — METOPROLOL TARTRATE 5 MG/5ML IV SOLN
5.0000 mg | INTRAVENOUS | Status: DC | PRN
  Administered 2020-06-19: 5 mg via INTRAVENOUS

## 2020-06-19 MED ORDER — NITROGLYCERIN 0.4 MG SL SUBL: 0.8000 mg | SUBLINGUAL_TABLET | Freq: Once | SUBLINGUAL | Status: AC

## 2020-06-19 NOTE — Progress Notes (Signed)
istat creatinine done as patient was not able to get labs done pre CT heart as did not have a ride. Creatinine 0.5

## 2020-06-20 ENCOUNTER — Ambulatory Visit (HOSPITAL_COMMUNITY): Payer: Medicaid Other

## 2020-06-25 ENCOUNTER — Ambulatory Visit (HOSPITAL_COMMUNITY): Payer: Medicaid Other | Admitting: Physical Therapy

## 2020-06-25 ENCOUNTER — Telehealth (HOSPITAL_COMMUNITY): Payer: Self-pay | Admitting: Physical Therapy

## 2020-06-25 NOTE — Telephone Encounter (Signed)
pt rescheduled appt for today because her asthmas is bad

## 2020-06-29 ENCOUNTER — Ambulatory Visit: Payer: Medicaid Other | Admitting: Allergy & Immunology

## 2020-07-02 NOTE — Telephone Encounter (Signed)
closed

## 2020-07-04 ENCOUNTER — Ambulatory Visit (INDEPENDENT_AMBULATORY_CARE_PROVIDER_SITE_OTHER): Payer: Medicaid Other | Admitting: Pulmonary Disease

## 2020-07-04 ENCOUNTER — Telehealth: Payer: Self-pay

## 2020-07-04 ENCOUNTER — Encounter: Payer: Self-pay | Admitting: Pulmonary Disease

## 2020-07-04 ENCOUNTER — Telehealth: Payer: Self-pay | Admitting: Pulmonary Disease

## 2020-07-04 ENCOUNTER — Other Ambulatory Visit: Payer: Self-pay

## 2020-07-04 VITALS — BP 128/90 | HR 89 | Temp 97.4°F | Ht 65.0 in | Wt 291.2 lb

## 2020-07-04 DIAGNOSIS — U071 COVID-19: Secondary | ICD-10-CM | POA: Diagnosis not present

## 2020-07-04 DIAGNOSIS — J301 Allergic rhinitis due to pollen: Secondary | ICD-10-CM

## 2020-07-04 DIAGNOSIS — G4733 Obstructive sleep apnea (adult) (pediatric): Secondary | ICD-10-CM | POA: Diagnosis not present

## 2020-07-04 DIAGNOSIS — J4551 Severe persistent asthma with (acute) exacerbation: Secondary | ICD-10-CM

## 2020-07-04 DIAGNOSIS — Z9989 Dependence on other enabling machines and devices: Secondary | ICD-10-CM

## 2020-07-04 MED ORDER — ALBUTEROL SULFATE (2.5 MG/3ML) 0.083% IN NEBU: 2.5000 mg | INHALATION_SOLUTION | Freq: Four times a day (QID) | RESPIRATORY_TRACT | 5 refills | Status: DC | PRN

## 2020-07-04 MED ORDER — PREDNISONE 10 MG PO TABS: ORAL_TABLET | ORAL | 0 refills | Status: AC

## 2020-07-04 MED ORDER — BENZONATATE 200 MG PO CAPS: 200.0000 mg | ORAL_CAPSULE | Freq: Three times a day (TID) | ORAL | 1 refills | Status: DC | PRN

## 2020-07-04 MED ORDER — FORMOTEROL FUMARATE 20 MCG/2ML IN NEBU: 20.0000 ug | INHALATION_SOLUTION | Freq: Two times a day (BID) | RESPIRATORY_TRACT | 11 refills | Status: DC

## 2020-07-04 MED ORDER — FLUTICASONE PROPIONATE 50 MCG/ACT NA SUSP: 1.0000 | Freq: Every day | NASAL | 5 refills | Status: DC

## 2020-07-04 MED ORDER — BUDESONIDE 0.5 MG/2ML IN SUSP: 0.5000 mg | Freq: Two times a day (BID) | RESPIRATORY_TRACT | 12 refills | Status: DC

## 2020-07-04 NOTE — Telephone Encounter (Signed)
She will have to follow through with weight management as medicaid does not cover bariatric surgery

## 2020-07-04 NOTE — Telephone Encounter (Signed)
Called [facility] spoke with [facility], advised that the Budesonide is requiring a PA.    Called and spoke with patient, I advised her that I had called the pharmacy and was told that the budesonide was going to require a PA.  Advised that she get the albuterol nebulizer solution and her prednisone.  She was sob at the time of my call and stated she had not been feeling well for about a week.  She was trying to wait until all of her medications were ready because her son was going to go pick them up for her and she did not want him to have to keep leaving work.  Advised that the Budesonide would take some time to get and to go ahead and get her albuterol and prednisone and start those.  She was told they had to order the other new nebulizer treatment, performist.  She will call the pharmacy and see if her medications are ready to be picked up.  Advised to let us know if the albuterol and prednisone did not help.  She verbalized understanding.  Called [facility] and advised that the pharmacy needs to run the prescription as brand name, Pulmicort and does not require a PA as it is on the preferred list.  Called a pharmacy and advised of the above.  They will run the script as brand name.  Nothing further needed.

## 2020-07-04 NOTE — Patient Instructions (Signed)
Stop using breztri  Prednisone 10 mg pill >> 4 pills daily for 2 days, 3 pills daily for 2 days, 2 pills daily for 2 days, 1 pill daily for 2 days  Budesonide (pulmicort) one vial nebulized in the morning and one vial nebulized in the evening, and rinse your mouth after each use  Formoterol (perforomist) one vial nebulized in the morning and one vial nebulized in the evening  Continue montelukast (singulair) 10 mg pill nightly, cetirizine (zyrtec) 10 mg pill daily, and fluticasone (flonase) one spray in each nostril daily  Follow up in 4 weeks

## 2020-07-04 NOTE — Addendum Note (Signed)
Addended by: Melonie Florida on: 07/04/2020 09:52 AM   Modules accepted: Orders

## 2020-07-04 NOTE — Telephone Encounter (Signed)
Pt is wanting bariatic surgery  [facility] weight management does not do that

## 2020-07-04 NOTE — Telephone Encounter (Signed)
Pt is calling regarding her referrals, gave them all except the mental health I could not provide that to her.

## 2020-07-04 NOTE — Progress Notes (Signed)
[facility]  Chief Complaint  Patient presents with  . Follow-up    Productive cough with clear phlegm    Constitutional:  BP 128/90 (BP Location: Left Arm, Cuff Size: Normal)   Pulse 89   Temp (!) 97.4 F (36.3 C) (Other (Comment)) Comment (Src): wrist  Ht 5' 5"  (1.651 m)   Wt 291 lb 3.2 oz (132.1 kg)   SpO2 99% Comment: Room air  BMI 48.46 kg/m   Past Medical History:  Asthma, Anemia, Seizures, DM type 2, HTN, GERD, RA, COVID 19 infection November 2021  Past Surgical History:  Her  has a past surgical history that includes Appendectomy and Cesarean section.  Brief Summary:  Elizabeth Bautista is a 45 y.o. female former smoker with asthma, upper airway cough, laryngopharyngeal reflux, and obstructive sleep apnea.       Subjective:   She had COVID infection in November.  Went to ER several times, but didn't get any specific therapy.  CT chest from 12/09 showed mild changes of COVID pneumonia, but these weren't apparent on CT cardiac from January.  Over the past weeks she has more cough and wheeze.  She is bringing up clear sputum.  Feels tight in her chest.  Has been using albuterol nebulizer several times per day.  Doesn't feel like breztri helped.  No fever, skin rash, or GI symptoms.  Hasn't got her CPAP yet.  Told DME was waiting on Medicaid approval.  Physical Exam:   Appearance - well kempt   ENMT - no sinus tenderness, no oral exudate, no LAN, Mallampati 4 airway, no stridor, clear nasal drainage  Respiratory - b/l expiratory wheezing  CV - s1s2 regular rate and rhythm, no murmurs  Ext - no clubbing, no edema  Skin - no rashes  Psych - normal mood and affect   Pulmonary testing:   Spirometry 08/18/17 >> FEV1 2.03 (82%), FEV1% 73  Chest Imaging:   CT sinus 10/20/19 >> normal  CT angio chest 05/10/20 >> airway thickening, faint patchy GGO in lunges  Sleep Tests:   PSG 05/23/20 >> AHI 6.7, SpO2 low 83%.  REM AHI  32.4.  Cardiac Tests:   Echo 01/24/20 >> EF 55 to 60%, grade 1 DD, mod LA dilation  Social History:  She  reports that she quit smoking about 6 years ago. Her smoking use included cigarettes. She quit after 10.00 years of use. She has never used smokeless tobacco. She reports previous alcohol use. She reports previous drug use.  Family History:  Her family history includes Angioedema in her daughter; Asthma in her child, child, daughter, maternal aunt, and son; Breast cancer in her cousin; Cervical cancer in her maternal aunt; Dementia in her maternal aunt; Diabetes in her brother, father, paternal aunt, paternal grandmother, sister, and another family member; Heart Problems in her maternal grandmother and sister; Heart attack in an other family member; High blood pressure in her mother and another family member; Seizures in her cousin, cousin, and cousin.     Assessment/Plan:   Severe, persistent allergic asthma. - she has an acute exacerbation - doesn't feel like breztri has been effective - will give course of prednisone; don't think she needs antibiotics at this time - start budesonide and formoterol by nebulizer bid - continue montelukast qhs, cetirizine daily, and fluticasone nasal spray daily - will reassess status in few weeks, and determine if she might need to be assessed for a biologic agent - prn tessalon for  cough  Obstructive sleep apnea. - reviewed her sleep study with her - she is waiting for Medicaid approval before getting CPAP machine  Upper airway cough syndrome. - has perennial allergic rhinitis - continue montelukast qhs, cetirizine daily, and fluticasone nasal spray daily  Rheumatoid arthritis. - she has been on plaquenil  Obesity. - discussed importance of weight loss   Time Spent Involved in Patient Care on Day of Examination:  34 minutes  Follow up:  Patient Instructions  Stop using breztri  Prednisone 10 mg pill >> 4 pills daily for 2 days, 3  pills daily for 2 days, 2 pills daily for 2 days, 1 pill daily for 2 days  Budesonide (pulmicort) one vial nebulized in the morning and one vial nebulized in the evening, and rinse your mouth after each use  Formoterol (perforomist) one vial nebulized in the morning and one vial nebulized in the evening  Continue montelukast (singulair) 10 mg pill nightly, cetirizine (zyrtec) 10 mg pill daily, and fluticasone (flonase) one spray in each nostril daily  Follow up in 4 weeks   Medication List:   Allergies as of 07/04/2020      Reactions   Phenytoin Anaphylaxis   Other reaction(s): swelling and heart rate decreased   Phenylbutazones       Medication List       Accurate as of July 04, 2020  9:50 AM. If you have any questions, ask your nurse or doctor.        STOP taking these medications   Breztri Aerosphere 160-9-4.8 MCG/ACT Aero Generic drug: Budeson-Glycopyrrol-Formoterol Stopped by: Chesley Mires, MD     TAKE these medications   albuterol 108 (90 Base) MCG/ACT inhaler Commonly known as: ProAir HFA 2 puffs every 4 hours as needed only  if your can't catch your breath What changed: Another medication with the same name was added. Make sure you understand how and when to take each. Changed by: Chesley Mires, MD   albuterol (2.5 MG/3ML) 0.083% nebulizer solution Commonly known as: PROVENTIL Take 3 mLs (2.5 mg total) by nebulization every 6 (six) hours as needed for wheezing or shortness of breath. What changed: You were already taking a medication with the same name, and this prescription was added. Make sure you understand how and when to take each. Changed by: Chesley Mires, MD   amLODipine 5 MG tablet Commonly known as: NORVASC Take 1 tablet (5 mg total) by mouth daily.   benzonatate 200 MG capsule Commonly known as: TESSALON Take 1 capsule (200 mg total) by mouth 3 (three) times daily as needed for cough. Started by: Chesley Mires, MD   blood glucose meter kit and  supplies Dispense based on patient and insurance preference. Use up to four times daily as directed. (FOR ICD-10 E10.9, E11.9).   budesonide 0.5 MG/2ML nebulizer solution Commonly known as: PULMICORT Take 2 mLs (0.5 mg total) by nebulization 2 (two) times daily. Started by: Chesley Mires, MD   busPIRone 7.5 MG tablet Commonly known as: BUSPAR Take 7.5 mg by mouth 2 (two) times daily.   cetirizine 10 MG tablet Commonly known as: ZYRTEC Take 1 tablet (10 mg total) by mouth daily.   Cholecalciferol 1.25 MG (50000 UT) Tabs Take 1 tablet by mouth daily.   cyclobenzaprine 10 MG tablet Commonly known as: FLEXERIL Take 10 mg by mouth 3 (three) times daily as needed for muscle spasms.   escitalopram 20 MG tablet Commonly known as: LEXAPRO Take 1 tablet by mouth every evening.  famotidine 20 MG tablet Commonly known as: PEPCID Take 20 mg by mouth daily.   ferrous sulfate 325 (65 FE) MG tablet Take 325 mg by mouth 2 (two) times daily.   fluticasone 0.005 % ointment Commonly known as: CUTIVATE Apply 1 application topically daily.   fluticasone 50 MCG/ACT nasal spray Commonly known as: FLONASE Place 1 spray into both nostrils daily.   formoterol 20 MCG/2ML nebulizer solution Commonly known as: PERFOROMIST Take 2 mLs (20 mcg total) by nebulization 2 (two) times daily. Started by: Chesley Mires, MD   furosemide 20 MG tablet Commonly known as: LASIX Take 20 mg by mouth daily.   gabapentin 100 MG capsule Commonly known as: Neurontin Take 2 capsules (200 mg total) by mouth 3 (three) times daily.   hydrochlorothiazide 25 MG tablet Commonly known as: HYDRODIURIL Take 1 tablet (25 mg total) by mouth daily. For high blood pressure.   hydroxychloroquine 200 MG tablet Commonly known as: PLAQUENIL Take 1 tablet (200 mg total) by mouth 2 (two) times daily.   hydrOXYzine 25 MG tablet Commonly known as: ATARAX/VISTARIL Take 25 mg by mouth 2 (two) times daily.   ibuprofen 800 MG  tablet Commonly known as: ADVIL Take 1 tablet (800 mg total) by mouth every 8 (eight) hours as needed.   Klor-Con M20 20 MEQ tablet Generic drug: potassium chloride SA TAKE 1 TABLET BY MOUTH DAILY ONLY WHEN YOU TAKE FUROSEMIDE. What changed: See the new instructions.   levETIRAcetam 500 MG tablet Commonly known as: Keppra Take 1 tablet (500 mg total) by mouth 2 (two) times daily.   metoprolol tartrate 50 MG tablet Commonly known as: LOPRESSOR TAKE 1 TABLET 2 HR PRIOR TO CARDIAC PROCEDURE   mirtazapine 30 MG tablet Commonly known as: REMERON Take 30 mg by mouth at bedtime.   montelukast 10 MG tablet Commonly known as: SINGULAIR Take 1 tablet (10 mg total) by mouth at bedtime.   pantoprazole 40 MG tablet Commonly known as: Protonix Take 1 tablet (40 mg total) by mouth daily. Take 30-60 min before first meal of the day   prazosin 1 MG capsule Commonly known as: MINIPRESS Take 1 mg by mouth at bedtime.   predniSONE 10 MG tablet Commonly known as: DELTASONE Take 4 tablets (40 mg total) by mouth daily with breakfast for 2 days, THEN 3 tablets (30 mg total) daily with breakfast for 2 days, THEN 2 tablets (20 mg total) daily with breakfast for 2 days, THEN 1 tablet (10 mg total) daily with breakfast for 2 days. Start taking on: July 04, 2020 Started by: Chesley Mires, MD   QUEtiapine 25 MG tablet Commonly known as: SEROQUEL Take 25 mg by mouth 2 (two) times daily.   Sphygmomanometer Misc 1 each by Does not apply route daily.       Signature:  Chesley Mires, MD [place] Pager - [phone] 07/04/2020, 9:50 AM

## 2020-07-05 ENCOUNTER — Other Ambulatory Visit: Payer: Self-pay | Admitting: *Deleted

## 2020-07-05 DIAGNOSIS — F32A Depression, unspecified: Secondary | ICD-10-CM

## 2020-07-05 DIAGNOSIS — F419 Anxiety disorder, unspecified: Secondary | ICD-10-CM

## 2020-07-05 NOTE — Progress Notes (Signed)
mb

## 2020-07-09 ENCOUNTER — Ambulatory Visit (HOSPITAL_COMMUNITY)
Admission: RE | Admit: 2020-07-09 | Discharge: 2020-07-09 | Disposition: A | Discharge status: Home / Self Care | Payer: Medicaid Other | Source: Ambulatory Visit | Attending: Internal Medicine | Admitting: Internal Medicine

## 2020-07-09 ENCOUNTER — Encounter (HOSPITAL_COMMUNITY): Payer: Self-pay

## 2020-07-09 ENCOUNTER — Other Ambulatory Visit: Payer: Self-pay

## 2020-07-09 DIAGNOSIS — Z1231 Encounter for screening mammogram for malignant neoplasm of breast: Secondary | ICD-10-CM | POA: Diagnosis present

## 2020-07-09 IMAGING — MG MM DIGITAL SCREENING BILAT W/ TOMO AND CAD
8 of 11 series · 8 of 27 positions shown · non-contrast
Comparison: None.

CLINICAL DATA: Screening.

EXAM:
DIGITAL SCREENING BILATERAL MAMMOGRAM WITH TOMOSYNTHESIS AND CAD
TECHNIQUE: Bilateral screening digital craniocaudal and mediolateral oblique
mammograms were obtained. Bilateral screening digital breast
tomosynthesis was performed. The images were evaluated with
computer-aided detection.

[R CV]
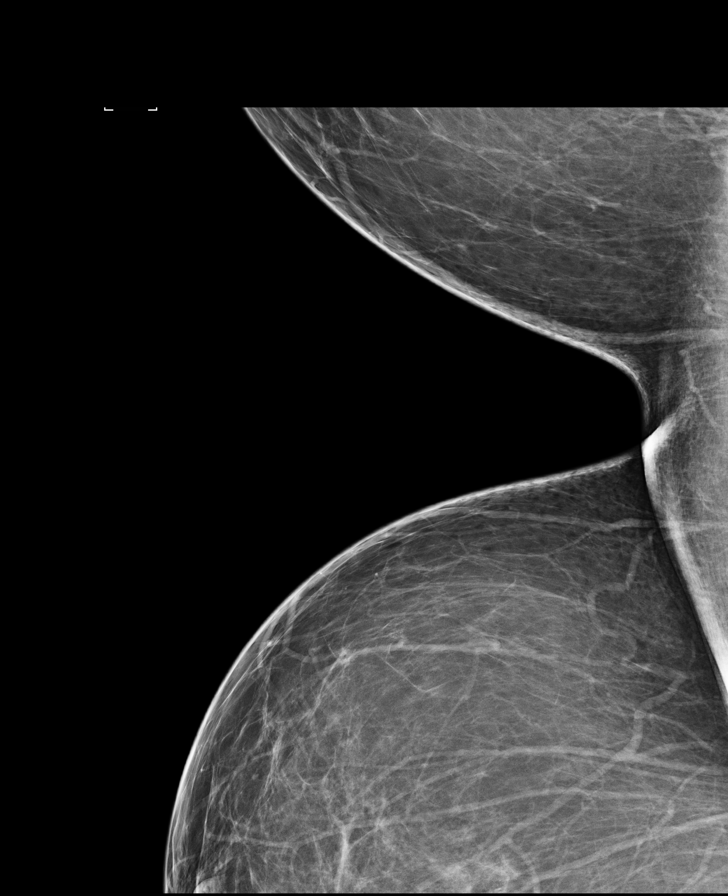

[L CC synth-2D]
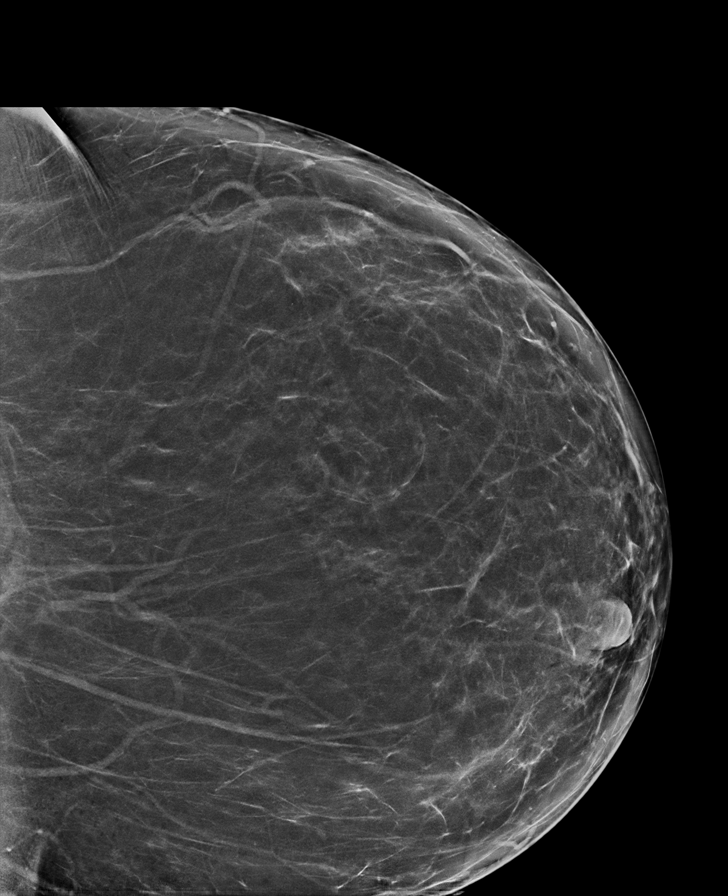

[R MLO synth-2D (1 of 2)]
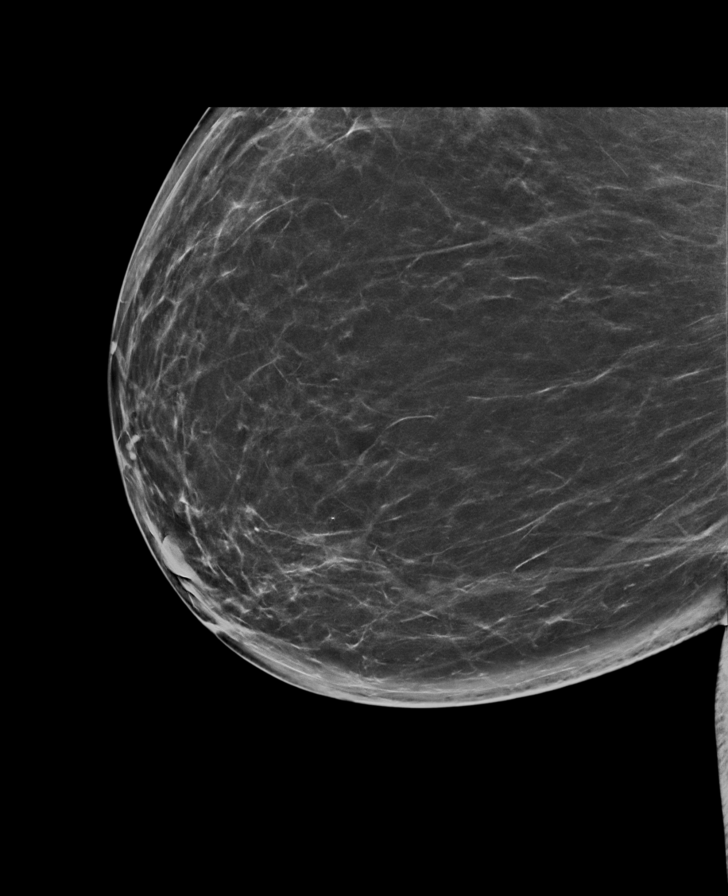

[R MLO synth-2D (2 of 2)]
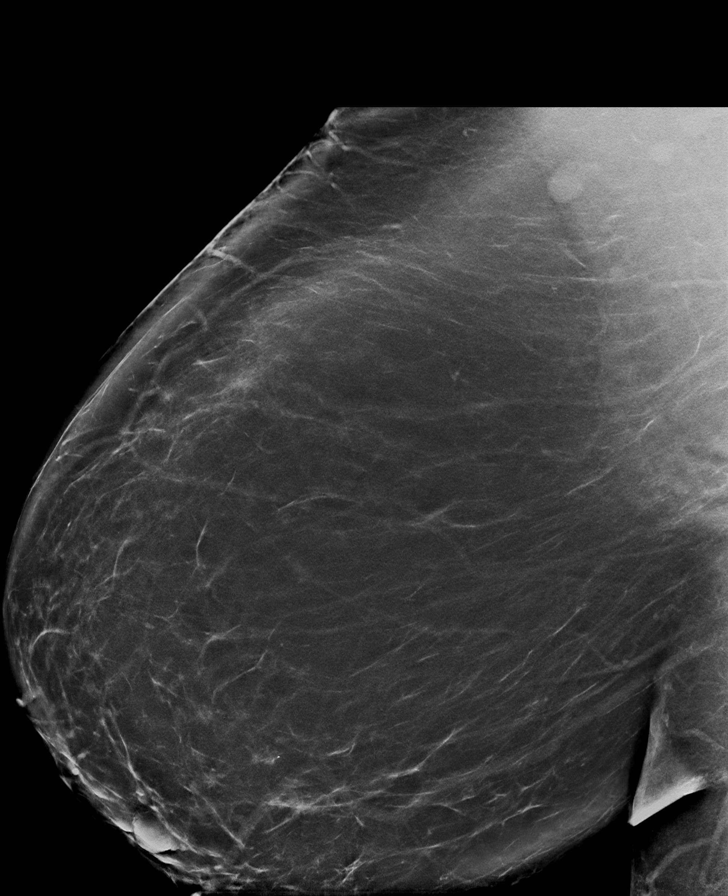

[L MLO synth-2D (1 of 2)]
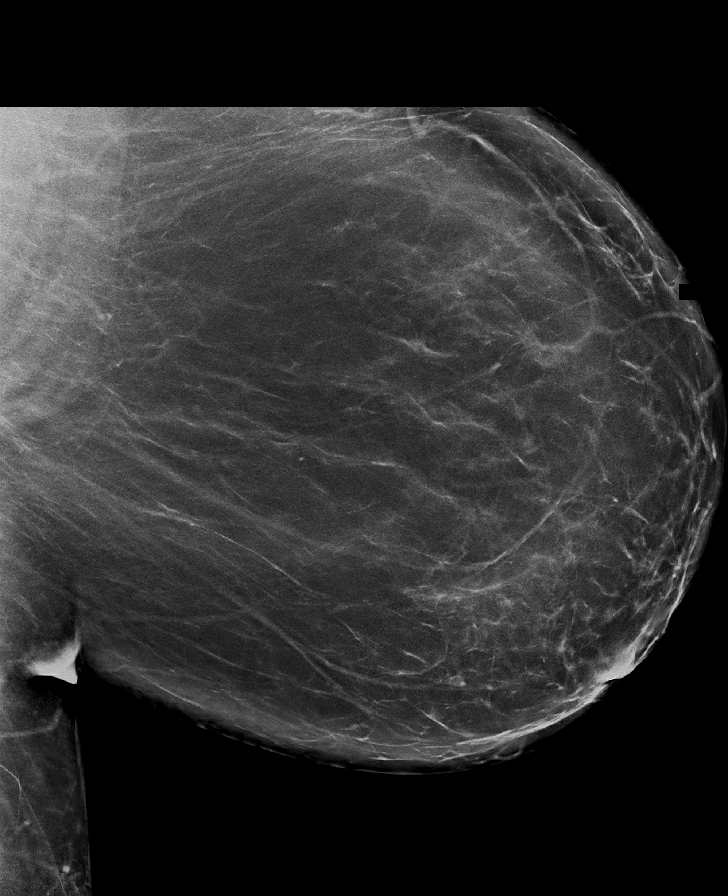

[L MLO synth-2D (2 of 2)]
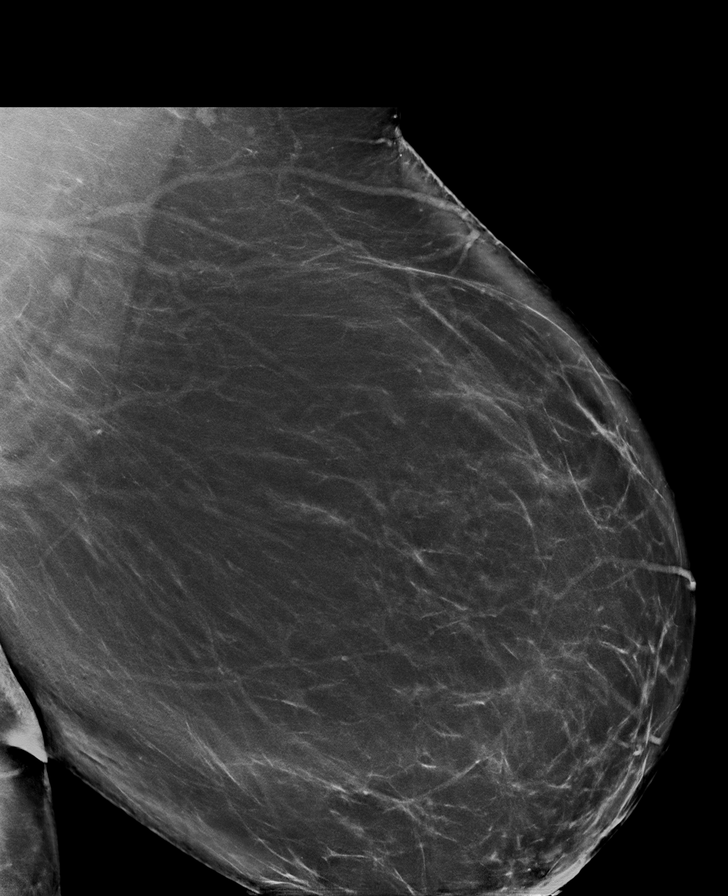

[R CC synth-2D]
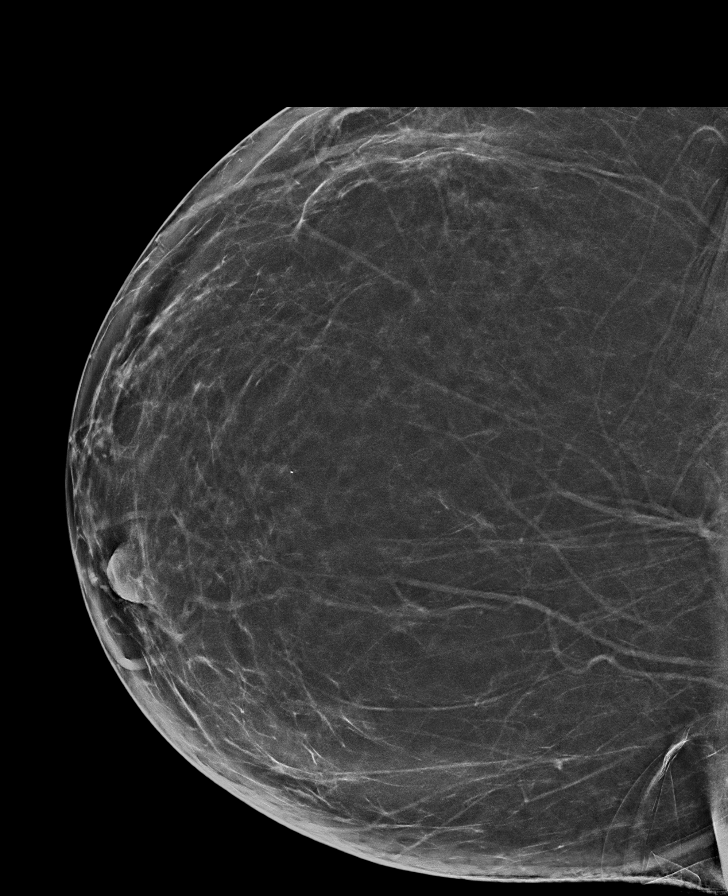

[R CC tomo · tomo slice 45/89.0]
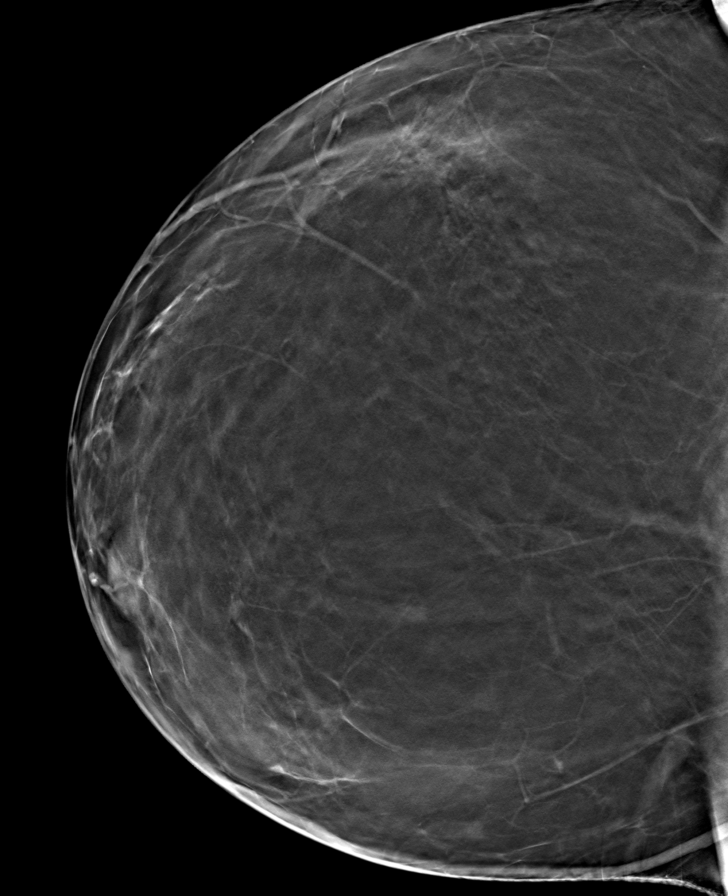

[8 of 27 positions shown; findings below may reference images not displayed]

ACR Breast Density Category b: There are scattered areas of
fibroglandular density.
FINDINGS: There are no findings suspicious for malignancy.
IMPRESSION: No mammographic evidence of malignancy. A result letter of this
screening mammogram will be mailed directly to the patient.

RECOMMENDATION:
Screening mammogram in one year. (Code:[4Q])

BI-RADS CATEGORY  1: Negative.

## 2020-07-11 ENCOUNTER — Telehealth: Payer: Self-pay | Admitting: Pulmonary Disease

## 2020-07-11 NOTE — Telephone Encounter (Signed)
Spoke with patient. She stated that the pharmacy stated that they never received an authorization for the Pulmicort or Brovana. Per the note on 07/04/20, a call was made to [employer] and a PA is not needed if the claims are ran as brand name. She stated that the pharmacy stated they never received a call from our office. I advised her I would call the pharmacy. She verbalized understanding.   [facility]ed [facility] at [phone] but was on hold for over 10 minutes. Will attempt to call back later.

## 2020-07-11 NOTE — Telephone Encounter (Signed)
Resent referral  

## 2020-07-13 LAB — ALLERGENS W/TOTAL IGE AREA 2
Alternaria Alternata IgE: <0.1 kU/L
Aspergillus Fumigatus IgE: <0.1 kU/L
Bermuda Grass IgE: <0.1 kU/L
Cedar, Mountain IgE: <0.1 kU/L
Cladosporium Herbarum IgE: <0.1 kU/L
Cockroach, German IgE: 0.13 kU/L — AB
Common Silver Birch IgE: <0.1 kU/L
Cottonwood IgE: <0.1 kU/L
D Farinae IgE: 30 kU/L — AB
D Pteronyssinus IgE: 32.5 kU/L — AB
Elm, American IgE: <0.1 kU/L
IgE (Immunoglobulin E), Serum: 884 IU/mL — ABNORMAL HIGH (ref 6–495)
Johnson Grass IgE: <0.1 kU/L
Maple/Box Elder IgE: <0.1 kU/L
Mouse Urine IgE: <0.1 kU/L
Oak, White IgE: <0.1 kU/L
Pecan, Hickory IgE: 0.5 kU/L — AB
Penicillium Chrysogen IgE: <0.1 kU/L
Pigweed, Rough IgE: <0.1 kU/L
Ragweed, Short IgE: <0.1 kU/L
Sheep Sorrel IgE Qn: <0.1 kU/L
Timothy Grass IgE: <0.1 kU/L
White Mulberry IgE: <0.1 kU/L

## 2020-07-13 LAB — ALPHA-1-ANTITRYPSIN: A-1 Antitrypsin: 147 mg/dL (ref 101–187)

## 2020-07-13 LAB — ANCA TITERS
Atypical pANCA: <1:20 {titer}
C-ANCA: <1:20 {titer}
P-ANCA: <1:20 {titer}

## 2020-07-13 LAB — ALLERGENS W/COMP RFLX AREA 2
E001-IgE Cat Dander: 25.8 kU/L — AB
E005-IgE Dog Dander: 18.1 kU/L — AB

## 2020-07-13 LAB — PANEL 606578
E094-IgE Fel d 1: 27.8 kU/L — AB
E220-IgE Fel d 2: <0.1 kU/L
E228-IgE Fel d 4: <0.1 kU/L

## 2020-07-13 LAB — PANEL 606648
E101-IgE Can f 1: 6.77 kU/L — AB
E102-IgE Can f 2: <0.1 kU/L
E221-IgE Can f 3: <0.1 kU/L
E226-IgE Can f 5: 0.21 kU/L — AB

## 2020-07-13 LAB — ASPERGILLUS PRECIPITINS
A.Fumigatus #1 Abs: NEGATIVE
Aspergillus Flavus Antibodies: NEGATIVE
Aspergillus Niger Antibodies: NEGATIVE
Aspergillus glaucus IgG: NEGATIVE
Aspergillus nidulans IgG: NEGATIVE
Aspergillus terreus IgG: NEGATIVE

## 2020-07-13 LAB — ALLERGEN COMPONENT COMMENTS

## 2020-07-16 ENCOUNTER — Encounter (HOSPITAL_COMMUNITY): Payer: Self-pay

## 2020-07-16 ENCOUNTER — Other Ambulatory Visit: Payer: Self-pay

## 2020-07-16 ENCOUNTER — Ambulatory Visit (HOSPITAL_COMMUNITY): Payer: Medicaid Other | Attending: Internal Medicine

## 2020-07-16 DIAGNOSIS — G8929 Other chronic pain: Secondary | ICD-10-CM | POA: Diagnosis present

## 2020-07-16 DIAGNOSIS — M545 Low back pain, unspecified: Secondary | ICD-10-CM | POA: Diagnosis not present

## 2020-07-16 DIAGNOSIS — R262 Difficulty in walking, not elsewhere classified: Secondary | ICD-10-CM

## 2020-07-16 DIAGNOSIS — M6281 Muscle weakness (generalized): Secondary | ICD-10-CM

## 2020-07-16 NOTE — Therapy (Signed)
[facility] [address] [place], [place], [address] Phone: [phone]   Fax:  [phone]  Physical Therapy Evaluation  Patient Details  Name: Elizabeth Bautista MRN: [id number] Date of Birth: 1976/03/05 Referring Provider (PT): Dr. Allena Katz   Encounter Date: 07/16/2020   PT End of Session - 07/16/20 0950    Visit Number 1    Number of Visits 12    Date for PT Re-Evaluation 08/27/20    Authorization Type Medicaid of [place]    Authorization - Visit Number 1    Authorization - Number of Visits 3    Progress Note Due on Visit 10    PT Start Time 0944    PT Stop Time 1020    PT Time Calculation (min) 36 min    Activity Tolerance Patient limited by fatigue;Patient limited by pain    Behavior During Therapy [facility] for tasks assessed/performed           Past Medical History:  Diagnosis Date  . Anemia   . Asthma   . Diabetes (HCC)    on Metformin  . Eczema   . Edema   . 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 HTN (hypertension)   . Rheumatoid arthritis (HCC)    on Plaquenil   . Seizures (HCC)    last seizure 2019  . Sleep apnea   . Sleep apnea     Past Surgical History:  Procedure Laterality Date  . APPENDECTOMY    . CESAREAN SECTION     x3    There were no vitals filed for this visit.    Subjective Assessment - 07/16/20 0945    Subjective Patient reports left LE pain and back pain and notes this has occured with onset of seizure activity x 3 years.  Patient reports onset of seizures is brought about by stress. Reports back pain all the time and has experienced a couple of falls in the past where she feels like she injured her back. Patient reports using a straight cane since 2019 and a rollator when her back is bothering are bothering her.    Limitations Sitting;Lifting;Standing;Walking    How long can you sit comfortably? 5-10 min    How long can  you stand comfortably? 5-10 min    How long can you walk comfortably? 5-10 min    Currently in Pain? Yes    Pain Score 8     Pain Location Back    Pain Orientation Lower    Pain Descriptors / Indicators Aching;Radiating    Pain Type Chronic pain    Pain Radiating Towards LLE    Pain Onset More than a month ago    Pain Frequency Intermittent              [facility] PT Assessment - 07/16/20 0001      Assessment   Medical Diagnosis chronic LBP    Referring Provider (PT) Dr. Allena Katz    Next MD Visit 08/22/20      Balance Screen   Has the patient fallen in the past 6 months Yes    How many times? 1    Has the patient had a decrease in activity level because of a fear of falling?  Yes    Is the patient reluctant to leave their home because of a fear of falling?  Yes      Home  [employer] residence    Living Arrangements Children    Available Help at Discharge Family    Type of Home Apartment   duplex   Home Access Stairs to enter    Entrance Stairs-Number of Steps 4    Entrance Stairs-Rails Right    Home Layout Two level    Alternate Level Stairs-Number of Steps 12    Home Equipment Walker - 4 wheels;Cane - single point;Shower seat;Bedside commode      Prior Function   Level of Independence Requires assistive device for independence;Needs assistance with ADLs;Needs assistance with homemaking    Leisure Can't do anything because pain      Observation/Other Assessments   Other Surveys  Oswestry Disability Index    Oswestry Disability Index  38/50 indicating severe disability      ROM / Strength   AROM / PROM / Strength AROM;Strength      AROM   AROM Assessment Site Lumbar    Lumbar Flexion 75% limited    Lumbar Extension 25% limited    Lumbar - Right Side Bend WNL    Lumbar - Left Side Bend 25% limited    Lumbar - Right Rotation 25% limited    Lumbar - Left Rotation 25% limited      Strength   Strength Assessment Site Hip;Knee;Ankle;Lumbar     Right/Left Hip Right;Left    Right Hip Flexion 3+/5    Right Hip ABduction 3/5    Right Hip ADduction 3/5    Left Hip Flexion 3-/5    Left Hip ABduction 3/5    Left Hip ADduction 3/5    Right/Left Knee Right;Left    Right Knee Flexion 3+/5    Right Knee Extension 3+/5    Left Knee Flexion 3+/5    Left Knee Extension 3+/5    Right/Left Ankle Right;Left    Right Ankle Dorsiflexion 3/5    Right Ankle Plantar Flexion 3/5    Left Ankle Dorsiflexion 3/5    Left Ankle Plantar Flexion 3/5      Transfers   Transfers Sit to Stand    Sit to Stand 6: Modified independent (Device/Increase time)      Ambulation/Gait   Ambulation/Gait Yes    Ambulation/Gait Assistance 5: Supervision    Ambulation Distance (Feet) 50 Feet    Assistive device Straight cane    Gait Pattern Step-through pattern;Decreased step length - left;Decreased stride length;Antalgic;Poor foot clearance - left   dragging LLE   Gait velocity decreased    Gait Comments                      Objective measurements completed on examination: See above findings.               PT Education - 07/16/20 1020    Education Details pt educated in exam findings and verbal discussion about POC details and time line    Person(s) Educated Patient    Methods Explanation    Comprehension Verbalized understanding            PT Short Term Goals - 07/16/20 1027      PT SHORT TERM GOAL #1   Title Patient will report at least 25% improvement in symptoms for improved quality of life.    Time 3    Period Weeks    Status New    Target Date 08/06/20      PT SHORT TERM GOAL #2   Title  Patient will report back/LLE pain not exceeding 4/10 with LE strengthening to improve HEP compliance    Baseline 8/10 pain at rest and with activity    Time 3    Period Weeks    Status New    Target Date 08/06/20      PT SHORT TERM GOAL #3   Title Patient will be independent with HEP in order to improve functional  outcomes.    Time 3    Period Weeks    Status New    Target Date 08/06/20             PT Long Term Goals - 07/16/20 1031      PT LONG TERM GOAL #1   Title Patient will demonstrate improved ambulation velocity and tolerance as evidenced by 150 ft during using least restrictive AD    Baseline 50 ft with cane and dragging LLE    Time 6    Period Weeks    Status New    Target Date 08/27/20      PT LONG TERM GOAL #2   Title Patient will manifest decreased disability due to back pain as evidenced by score of 28/50 for Oswestry Low Back Pain Disability Questionnaire    Baseline 38/50 indicating "completely disabled"    Time 6    Period Weeks    Status New                  Plan - 07/16/20 1024    Clinical Impression Statement Patient is a  45 yo lady presenting to physical therapy with c/o chronic LBP. She presents with pain limited deficits in LE/trunk strength, ROM, endurance, postural impairments, spinal mobility and functional mobility with ADL. She is having to modify and restrict ADL as indicated by Oswestry Low Back Pain Disability Questionnaire score as well as subjective information and objective measures which is affecting overall participation. Patient will benefit from skilled physical therapy in order to improve function and reduce impairment.    Personal Factors and Comorbidities Comorbidity 1;Time since onset of injury/illness/exacerbation;Fitness;Past/Current Experience    Comorbidities PMH    Examination-Activity Limitations Bathing;Bed Mobility;Bend;Caring for Others;Carry;Lift;Stand;Stairs;Squat;Sit;Locomotion Level;Transfers    Examination-Participation Restrictions Church;Cleaning;Community Activity;Interpersonal Relationship;Laundry;Shop;Meal Prep    Stability/Clinical Decision Making Stable/Uncomplicated    Clinical Decision Making Low    Rehab Potential Fair    PT Frequency 2x / week    PT Duration 6 weeks    PT Treatment/Interventions ADLs/Self  Care Home Management;Aquatic Therapy;Biofeedback;Cryotherapy;Electrical Stimulation;DME Instruction;Traction;Gait training;Stair training;Functional mobility training;Therapeutic activities;Therapeutic exercise;Balance training;Patient/family education;Neuromuscular re-education;Manual techniques;Passive range of motion;Taping;Energy conservation;Dry needling;Spinal Manipulations;Joint Manipulations    PT Next Visit Plan Establish easy bed exercise routine for LE strength    PT Home Exercise Plan pt education           Patient will benefit from skilled therapeutic intervention in order to improve the following deficits and impairments:  Abnormal gait,Decreased activity tolerance,Decreased balance,Decreased mobility,Decreased knowledge of use of DME,Decreased endurance,Decreased range of motion,Decreased strength,Difficulty walking,Impaired perceived functional ability,Postural dysfunction,Improper body mechanics,Obesity,Pain  Visit Diagnosis: Chronic low back pain, unspecified back pain laterality, unspecified whether sciatica present  Difficulty in walking, not elsewhere classified  Muscle weakness (generalized)     Problem List Patient Active Problem List   Diagnosis Date Noted  . Encounter for screening fecal occult blood testing 06/04/2020  . Encounter for gynecological examination with Papanicolaou smear of cervix 06/04/2020  . Screening examination for STD (sexually transmitted disease) 06/04/2020  . Dysmenorrhea 06/04/2020  . Menorrhagia  with irregular cycle 06/04/2020  . Anxiety and depression 06/04/2020  . Encounter to establish care 05/24/2020  . Chronic low back pain 05/24/2020  . Physical deconditioning 05/24/2020  . COVID-19 virus infection 05/16/2020  . Asthma, moderate persistent 03/08/2020  . GERD (gastroesophageal reflux disease) 03/07/2020  . Anxiety 03/07/2020  . History of seizures 03/07/2020  . Upper airway cough syndrome 02/10/2020  . Swelling of lower  extremity 02/01/2020  . Hypertension 02/01/2020  . Vitamin D deficiency 02/01/2020  . Type 2 diabetes mellitus with hyperglycemia, without long-term current use of insulin (HCC) 02/01/2020  . Anemia 02/01/2020  . Class 3 severe obesity with serious comorbidity and body mass index (BMI) of 45.0 to 49.9 in adult [facility]) 10/22/2019  . OSA on CPAP 10/18/2019  . Depression 10/18/2019  . Rheumatoid arthritis (HCC) 10/18/2019    10:36 AM, 07/16/20 M. Shary Decamp, PT, DPT Physical Therapist- [place] Office Number: [phone]  [facility] [address] [place], [place], [address] Phone: [phone]   Fax:  [phone]  Name: Elizabeth Bautista MRN: [id number] Date of Birth: 02/04/1976

## 2020-07-17 NOTE — Telephone Encounter (Signed)
Called [facility] and spoke to tech and informed him to run the Pulmicort as brand name. This has been done and is $3 for 30 day. Called and spoke to pt. Informed her the medication is ready and the price. Pt verbalized understanding and denied any further questions or concerns at this time.

## 2020-07-22 ENCOUNTER — Encounter: Payer: Self-pay | Admitting: Cardiology

## 2020-07-24 ENCOUNTER — Encounter (HOSPITAL_COMMUNITY): Payer: Medicaid Other | Admitting: Physical Therapy

## 2020-07-25 ENCOUNTER — Ambulatory Visit (HOSPITAL_COMMUNITY): Payer: Medicaid Other | Admitting: Physical Therapy

## 2020-07-25 ENCOUNTER — Other Ambulatory Visit: Payer: Self-pay

## 2020-07-25 ENCOUNTER — Encounter: Payer: Self-pay | Admitting: Allergy & Immunology

## 2020-07-25 ENCOUNTER — Ambulatory Visit (INDEPENDENT_AMBULATORY_CARE_PROVIDER_SITE_OTHER): Payer: Medicaid Other | Admitting: Allergy & Immunology

## 2020-07-25 VITALS — BP 128/70 | HR 75 | Temp 98.1°F | Resp 18 | Ht 64.17 in | Wt 285.0 lb

## 2020-07-25 DIAGNOSIS — J3089 Other allergic rhinitis: Secondary | ICD-10-CM | POA: Diagnosis not present

## 2020-07-25 DIAGNOSIS — R262 Difficulty in walking, not elsewhere classified: Secondary | ICD-10-CM

## 2020-07-25 DIAGNOSIS — G8929 Other chronic pain: Secondary | ICD-10-CM

## 2020-07-25 DIAGNOSIS — T781XXD Other adverse food reactions, not elsewhere classified, subsequent encounter: Secondary | ICD-10-CM

## 2020-07-25 DIAGNOSIS — J302 Other seasonal allergic rhinitis: Secondary | ICD-10-CM

## 2020-07-25 DIAGNOSIS — J4550 Severe persistent asthma, uncomplicated: Secondary | ICD-10-CM | POA: Diagnosis not present

## 2020-07-25 DIAGNOSIS — M6281 Muscle weakness (generalized): Secondary | ICD-10-CM

## 2020-07-25 DIAGNOSIS — M545 Low back pain, unspecified: Secondary | ICD-10-CM | POA: Diagnosis not present

## 2020-07-25 MED ORDER — FLUTICASONE PROPIONATE 50 MCG/ACT NA SUSP: 1.0000 | Freq: Every day | NASAL | 5 refills | Status: DC

## 2020-07-25 MED ORDER — MONTELUKAST SODIUM 10 MG PO TABS: 10.0000 mg | ORAL_TABLET | Freq: Every day | ORAL | 5 refills | Status: DC

## 2020-07-25 NOTE — Therapy (Signed)
[facility] [address] [place], [place], [address] Phone: [phone]   Fax:  [phone]  Physical Therapy Treatment  Patient Details  Name: Elizabeth Bautista MRN: [id number] Date of Birth: Aug 16, 1975 Referring Provider (PT): Dr. Allena Katz   Encounter Date: 07/25/2020   PT End of Session - 07/25/20 0951    Visit Number 2    Number of Visits 12    Date for PT Re-Evaluation 08/27/20    Authorization Type Medicaid of [place], need to re-submit after 3rd visit    Authorization Time Period 3 visits approved 2/15-2/28    Authorization - Visit Number 1    Authorization - Number of Visits 3    Progress Note Due on Visit 10    PT Start Time 0915    PT Stop Time 0953    PT Time Calculation (min) 38 min    Activity Tolerance Patient limited by fatigue;Patient limited by pain    Behavior During Therapy [facility] for tasks assessed/performed           Past Medical History:  Diagnosis Date  . Anemia   . Asthma   . Diabetes (HCC)    on Metformin  . Eczema   . Edema   . 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 HTN (hypertension)   . Rheumatoid arthritis (HCC)    on Plaquenil   . Seizures (HCC)    last seizure 2019  . Sleep apnea   . Sleep apnea     Past Surgical History:  Procedure Laterality Date  . APPENDECTOMY    . CESAREAN SECTION     x3    There were no vitals filed for this visit.   Subjective Assessment - 07/25/20 0925    Subjective pt states she is having 8/10 pain in her Lt hip knee and Lower back.    Currently in Pain? Yes    Pain Score 8     Pain Location Back    Pain Orientation Left;Lower    Pain Descriptors / Indicators Aching                             [facility] Adult PT Treatment/Exercise - 07/25/20 0001      Knee/Hip Exercises: Standing   Gait Training with SPC 75'X2      Knee/Hip Exercises: Seated   Long Arc Quad  Both;10 reps    Other Seated Knee/Hip Exercises heel and toe raises 10X each    Marching Both;10 reps    Sit to Sand 5 reps;with UE support      Knee/Hip Exercises: Supine   Bridges Other (comment)    Bridges Limitations attempted, unable to get Lt LE to bend up    Other Supine Knee/Hip Exercises bed mobility, logroll technqiue                  PT Education - 07/25/20 0950    Education Details review of goals and POC moving forward.  importance of increasing activity; pushing forward.  Initiated HEP    Person(s) Educated Patient    Methods Explanation;Demonstration;Tactile cues;Verbal cues;Handout    Comprehension Verbalized understanding;Returned demonstration;Verbal cues required;Tactile cues required            PT Short Term Goals - 07/25/20 0943      PT SHORT TERM GOAL #1  Title Patient will report at least 25% improvement in symptoms for improved quality of life.    Time 3    Period Weeks    Status On-going    Target Date 08/06/20      PT SHORT TERM GOAL #2   Title Patient will report back/LLE pain not exceeding 4/10 with LE strengthening to improve HEP compliance    Baseline 8/10 pain at rest and with activity    Time 3    Period Weeks    Status On-going    Target Date 08/06/20      PT SHORT TERM GOAL #3   Title Patient will be independent with HEP in order to improve functional outcomes.    Time 3    Period Weeks    Status On-going    Target Date 08/06/20             PT Long Term Goals - 07/25/20 0943      PT LONG TERM GOAL #1   Title Patient will demonstrate improved ambulation velocity and tolerance as evidenced by 150 ft during using least restrictive AD    Baseline 50 ft with cane and dragging LLE    Time 6    Period Weeks    Status On-going      PT LONG TERM GOAL #2   Title Patient will manifest decreased disability due to back pain as evidenced by score of 28/50 for Oswestry Low Back Pain Disability Questionnaire    Baseline  38/50 indicating "completely disabled"    Time 6    Period Weeks    Status On-going                 Plan - 07/25/20 1148    Clinical Impression Statement Pt comes to therapy today with extreme antalgic gait walking back to clinic using SPC.   Pt tends to drag Lt LE with ambulation.  Began seated therex with minimal movement completed with Lt LE.  Attempted sit to stand, however pt declined initially stating she could no do this without major UE support.  Completed with bil UE assist.   Assumed supine positioning (layed straight down and up without logroll technique).  Instructed with logroll, however did not utilize this.  Attempted bent knee bridge but pt with extreme jerkiness attempting to bend knee but unable to maintain bent knee positioning.  Therapist attempted assistance into flexion, however pt guarded and unable to relax mm for therapist to passively place into flexion.  Concluded with education and seated march with minimal Lt LE rise.   Pt visualized with good clearance of Lt LE when transferring into vehicle upon leaving clinic.   Pt issued HEP to include sit to stands, seated heel and toe raises and long arc quads (also with jerky movements).   Encouraged to increase her activity at home, use walker when needed rather than dragging her Lt LE as would be a tripping hazard.    Personal Factors and Comorbidities Comorbidity 1;Time since onset of injury/illness/exacerbation;Fitness;Past/Current Experience    Comorbidities PMH    Examination-Activity Limitations Bathing;Bed Mobility;Bend;Caring for Others;Carry;Lift;Stand;Stairs;Squat;Sit;Locomotion Level;Transfers    Examination-Participation Restrictions Church;Cleaning;Community Activity;Interpersonal Relationship;Laundry;Shop;Meal Prep    Stability/Clinical Decision Making Stable/Uncomplicated    Rehab Potential Fair    PT Frequency 2x / week    PT Duration 6 weeks    PT Treatment/Interventions ADLs/Self Care Home  Management;Aquatic Therapy;Biofeedback;Cryotherapy;Electrical Stimulation;DME Instruction;Traction;Gait training;Stair training;Functional mobility training;Therapeutic activities;Therapeutic exercise;Balance training;Patient/family education;Neuromuscular re-education;Manual techniques;Passive range of motion;Taping;Energy conservation;Dry needling;Spinal  Manipulations;Joint Manipulations    PT Next Visit Plan continue to progress exercise routine for LE strength; may try standing as supine was difficult for patient.    PT Home Exercise Plan 2/23:  seated heel/toe raise, LAQ, sit to stands           Patient will benefit from skilled therapeutic intervention in order to improve the following deficits and impairments:  Abnormal gait,Decreased activity tolerance,Decreased balance,Decreased mobility,Decreased knowledge of use of DME,Decreased endurance,Decreased range of motion,Decreased strength,Difficulty walking,Impaired perceived functional ability,Postural dysfunction,Improper body mechanics,Obesity,Pain  Visit Diagnosis: Difficulty in walking, not elsewhere classified  Muscle weakness (generalized)  Chronic low back pain, unspecified back pain laterality, unspecified whether sciatica present     Problem List Patient Active Problem List   Diagnosis Date Noted  . Encounter for screening fecal occult blood testing 06/04/2020  . Encounter for gynecological examination with Papanicolaou smear of cervix 06/04/2020  . Screening examination for STD (sexually transmitted disease) 06/04/2020  . Dysmenorrhea 06/04/2020  . Menorrhagia with irregular cycle 06/04/2020  . Anxiety and depression 06/04/2020  . Encounter to establish care 05/24/2020  . Chronic low back pain 05/24/2020  . Physical deconditioning 05/24/2020  . COVID-19 virus infection 05/16/2020  . Asthma, moderate persistent 03/08/2020  . GERD (gastroesophageal reflux disease) 03/07/2020  . Anxiety 03/07/2020  . History of  seizures 03/07/2020  . Upper airway cough syndrome 02/10/2020  . Swelling of lower extremity 02/01/2020  . Essential hypertension 02/01/2020  . Vitamin D deficiency 02/01/2020  . Type 2 diabetes mellitus with hyperglycemia, without long-term current use of insulin (HCC) 02/01/2020  . Anemia 02/01/2020  . Class 3 severe obesity with serious comorbidity and body mass index (BMI) of 45.0 to 49.9 in adult [facility]) 10/22/2019  . OSA on CPAP 10/18/2019  . Depression 10/18/2019  . Rheumatoid arthritis (HCC) 10/18/2019   Lurena Nida, PTA/CLT [phone]  Lurena Nida 07/25/2020, 11:55 AM  [facility] [address] [place], [place], [address] Phone: [phone]   Fax:  [phone]  Name: Elizabeth Bautista MRN: [id number] Date of Birth: 1976/01/09

## 2020-07-25 NOTE — Patient Instructions (Addendum)
1. Seasonal and perennial allergic rhinitis - Testing in the past showed: grasses, ragweed, weeds, trees, indoor molds, outdoor molds, dust mites, cat and cockroach - Copy of test results provided.  - Avoidance measures provided. - Continue with: Singulair (montelukast) 10mg  daily and Zyrtec (cetirizine) 10mg  tablet once daily - Start taking: Flonase (fluticasone) one spray per nostril daily - You can use an extra dose of the antihistamine, if needed, for breakthrough symptoms.  - Consider nasal saline rinses 1-2 times daily to remove allergens from the nasal cavities as well as help with mucous clearance (this is especially helpful to do before the nasal sprays are given) - Consider allergy shots as a means of long-term control. - Allergy shots "re-train" and "reset" the immune system to ignore environmental allergens and decrease the resulting immune response to those allergens (sneezing, itchy watery eyes, runny nose, nasal congestion, etc).    - Allergy shots improve symptoms in 75-85% of patients.  - I want your breathing under better control if you want to start allergy shots, so we are going to hold off.   2. Severe persistent asthma, uncomplicated - Lung testing showed an FEV1 of 41%.  - Pick up the nebulizer medications that Dr. recommended and start those.  - Strongly consider adding on Nucala (mepolizumab) monthly to help with breathing.  - I do not want to do this without Dr. 08-20-1995 blessing since he is managing your breathing. - Daily controller medication(s): nebulizer medications twice daily + Singulair (montelukast) 10mg  daily - Prior to physical activity: albuterol 2 puffs 10-15 minutes before physical activity. - Rescue medications: albuterol 4 puffs every 4-6 hours as needed - Asthma control goals:  * Full participation in all desired activities (may need albuterol before activity) * Albuterol use two time or less a week on average (not counting use with activity) *  Cough interfering with sleep two time or less a month * Oral steroids no more than once a year * No hospitalizations  3. Return in about 3 months (around 10/22/2020).    Please inform Evlyn Courier of any Emergency Department visits, hospitalizations, or changes in symptoms. Call before going to the ED for breathing or allergy symptoms since we might be able to fit you in for a sick visit. Feel free to contact 10/24/2020 anytime with any questions, problems, or concerns.  It was a pleasure to see you again today!  Websites that have reliable patient information: 1. American Academy of Asthma, Allergy, and Immunology: www.aaaai.org 2. Food Allergy Research and Education (FARE): foodallergy.org 3. Mothers of Asthmatics: http://www.asthmacommunitynetwork.org 4. American College of Allergy, Asthma, and Immunology: www.acaai.org   COVID-19 Vaccine Information can be found at: [place] For questions related to vaccine distribution or appointments, please email [email or web address] or call [phone].   We realize that you might be concerned about having an allergic reaction to the COVID19 vaccines. To help with that concern, WE ARE OFFERING THE COVID19 VACCINES IN OUR OFFICE! Ask the front desk for dates!     "Like" [place] on Facebook and Instagram for our latest updates!      A healthy democracy works best when [email or web address] participate! Make sure you are registered to vote! If you have moved or changed any of your contact information, you will need to get this updated before voting!  In some cases, you MAY be able to register to vote online: [phone]

## 2020-07-25 NOTE — Patient Instructions (Signed)
KNEE: Extension, Long Arc Quads - Sitting    Raise leg until knee is straight.  _10__ reps per set, _2__ sets per day  Functional Quadriceps: Sit to Stand    Sit on edge of chair, feet flat on floor. Stand upright, extending knees fully. Repeat _5___ times per set. Do _2__ sets__2__ sessions per day.   Heel Raise (Sitting)    Raise heels, keeping toes on floor. Repeat _10___ times per set. do __2__ sessions per day.

## 2020-07-25 NOTE — Progress Notes (Signed)
FOLLOW UP  Date of Service/Encounter:  07/25/20   Assessment:   Severe persistent asthma, uncomplicated - with eosinophilic endotype (AEC 500 in September 2021)  Seasonal and perennial allergic rhinitis (grasses, ragweed, weeds, trees, indoor molds, outdoor molds, dust mites, cat and cockroach) - good candidate for allergen immunotherapy, but we want to get her breathing under better control first  COVID-19 pneumonia (November 2021) - with bilateral groundglass opacities on chest CT  COVID-19 vaccine hesitant  Rheumatoid arthritis - on Plaquenil   It is really hard to figure out how she is doing.  She has not started her new breathing medications, which were prescribed by pulmonology.  She is going to pick things up today.  She has wheezing throughout, but this was her baseline.  Her spirometry looks terrible.  From a rhinitis perspective, she would really benefit from allergen immunotherapy.  But as before, I would like to get her breathing under better control first.  She will make an excellent anti-IL-5 candidate.  She prefers to go with mepolizumab given the longer safety profile.  We did provide her with information about this.  I want to get Dr. Evlyn Courier blessing of this before proceeding.  Plan/Recommendations:   1. Seasonal and perennial allergic rhinitis - Testing in the past showed: grasses, ragweed, weeds, trees, indoor molds, outdoor molds, dust mites, cat and cockroach - Copy of test results provided.  - Avoidance measures provided. - Continue with: Singulair (montelukast) 10mg  daily and Zyrtec (cetirizine) 10mg  tablet once daily - Start taking: Flonase (fluticasone) one spray per nostril daily - You can use an extra dose of the antihistamine, if needed, for breakthrough symptoms.  - Consider nasal saline rinses 1-2 times daily to remove allergens from the nasal cavities as well as help with mucous clearance (this is especially helpful to do before the nasal sprays are  given) - Consider allergy shots as a means of long-term control. - Allergy shots "re-train" and "reset" the immune system to ignore environmental allergens and decrease the resulting immune response to those allergens (sneezing, itchy watery eyes, runny nose, nasal congestion, etc).    - Allergy shots improve symptoms in 75-85% of patients.  - I want your breathing under better control if you want to start allergy shots, so we are going to hold off.   2. Severe persistent asthma, uncomplicated - Lung testing showed an FEV1 of 41%.  - Pick up the nebulizer medications that Dr. recommended and start those.  - Strongly consider adding on Nucala (mepolizumab) monthly to help with breathing.  - I do not want to do this without Dr. [birth year]1995 blessing since he is managing your breathing. - Daily controller medication(s): nebulizer medications twice daily + Singulair (montelukast) 10mg  daily - Prior to physical activity: albuterol 2 puffs 10-15 minutes before physical activity. - Rescue medications: albuterol 4 puffs every 4-6 hours as needed - Asthma control goals:  * Full participation in all desired activities (may need albuterol before activity) * Albuterol use two time or less a week on average (not counting use with activity) * Cough interfering with sleep two time or less a month * Oral steroids no more than once a year * No hospitalizations  3. Return in about 3 months (around 10/22/2020).   Subjective:   Elizabeth Bautista is a 45 y.o. female presenting today for follow up of  Chief Complaint  Patient presents with  . Asthma    Elizabeth Bautista has a history of the following: Patient Active  Problem List   Diagnosis Date Noted  . Encounter for screening fecal occult blood testing 06/04/2020  . Encounter for gynecological examination with Papanicolaou smear of cervix 06/04/2020  . Screening examination for STD (sexually transmitted disease) 06/04/2020  . Dysmenorrhea 06/04/2020  . Menorrhagia  with irregular cycle 06/04/2020  . Anxiety and depression 06/04/2020  . Encounter to establish care 05/24/2020  . Chronic low back pain 05/24/2020  . Physical deconditioning 05/24/2020  . COVID-19 virus infection 05/16/2020  . Asthma, moderate persistent 03/08/2020  . GERD (gastroesophageal reflux disease) 03/07/2020  . Anxiety 03/07/2020  . History of seizures 03/07/2020  . Upper airway cough syndrome 02/10/2020  . Swelling of lower extremity 02/01/2020  . Essential hypertension 02/01/2020  . Vitamin D deficiency 02/01/2020  . Type 2 diabetes mellitus with hyperglycemia, without long-term current use of insulin (HCC) 02/01/2020  . Anemia 02/01/2020  . Class 3 severe obesity with serious comorbidity and body mass index (BMI) of 45.0 to 49.9 in adult [facility]) 10/22/2019  . OSA on CPAP 10/18/2019  . Depression 10/18/2019  . Rheumatoid arthritis (HCC) 10/18/2019    History obtained from: chart review and patient.  Elizabeth Bautista is a 45 y.o. female presenting for a follow up visit.  She was last seen in December 2021.  At that time, we diagnosed her with severe persistent asthma.  She does have an elevated absolute eosinophil count.  For her allergic rhinitis, she was interested in allergen immunotherapy but we want to get her breathing under better control.  She has history of COVID-19 pneumonia November 2021 with bilateral groundglass opacities on her chest CT.  She also has rheumatoid arthritis and is on Plaquenil.  For her asthma, we continued her on Breztri 2 puffs twice daily with a spacer and Singulair.  We did discuss the use of a biologic as well.  Her allergies, we continue Singulair and Zyrtec.  Since last visit, she has mostly done well.  Asthma/Respiratory Symptom History: She has been having issues with her breathing. She was placed on a new medicine and they did not have it initially. She saw Dr. Craige Cotta earlier in February and she was switched to all nebulized medications. She has not  started them since the pharmacy was not able to get them. She has never had breathing tests.   Allergic Rhinitis Symptom History: She still has the same issues. She remains on the montelukast as well as the Zyrtec. Her issues are still there but she is not 100% better. She had a nose spray prescribed but she never started it because Medicaid did not cover it. She did try an OTC nasal spray but this was not Flonase. It burned however and she only used it a few times.   Otherwise, there have been no changes to her past medical history, surgical history, family history, or social history.    Review of Systems  Constitutional: Negative.  Negative for chills, fever, malaise/fatigue and weight loss.  HENT: Negative.  Negative for congestion, ear discharge and ear pain.   Eyes: Negative for pain, discharge and redness.  Respiratory: Negative for cough, sputum production, shortness of breath and wheezing.   Cardiovascular: Negative.  Negative for chest pain and palpitations.  Gastrointestinal: Negative for abdominal pain, constipation, diarrhea, heartburn, nausea and vomiting.  Skin: Negative.  Negative for itching and rash.  Neurological: Negative for dizziness and headaches.  Endo/Heme/Allergies: Negative for environmental allergies. Does not bruise/bleed easily.       Objective:   Blood pressure 128/70,  pulse 75, temperature 98.1 F (36.7 C), temperature source Temporal, resp. rate 18, height 5' 4.17" (1.63 m), weight 285 lb (129.3 kg), last menstrual period 07/09/2020, SpO2 98 %. Body mass index is 48.66 kg/m.   Physical Exam:  Physical Exam Constitutional:      Appearance: She is well-developed.  HENT:     Head: Normocephalic and atraumatic.     Right Ear: Tympanic membrane, ear canal and external ear normal.     Left Ear: Tympanic membrane, ear canal and external ear normal.     Nose: No nasal deformity, septal deviation, mucosal edema, rhinorrhea or epistaxis.     Right  Turbinates: Enlarged and swollen.     Left Turbinates: Enlarged and swollen.     Right Sinus: No maxillary sinus tenderness or frontal sinus tenderness.     Left Sinus: No maxillary sinus tenderness or frontal sinus tenderness.     Mouth/Throat:     Mouth: Oropharynx is clear and moist. Mucous membranes are not pale and not dry.     Pharynx: Uvula midline.  Eyes:     General:        Right eye: No discharge.        Left eye: No discharge.     Extraocular Movements: EOM normal.     Conjunctiva/sclera: Conjunctivae normal.     Right eye: Right conjunctiva is not injected. No chemosis.    Left eye: Left conjunctiva is not injected. No chemosis.    Pupils: Pupils are equal, round, and reactive to light.  Cardiovascular:     Rate and Rhythm: Normal rate and regular rhythm.     Heart sounds: Normal heart sounds.  Pulmonary:     Effort: Pulmonary effort is normal. No tachypnea, accessory muscle usage or respiratory distress.     Breath sounds: Wheezing present. No rhonchi or rales.     Comments: Audibly wheezing.  Somewhat decreased air movement at bases.  No increased work of breathing. Chest:     Chest wall: No tenderness.  Lymphadenopathy:     Cervical: No cervical adenopathy.  Skin:    General: Skin is warm.     Capillary Refill: Capillary refill takes less than 2 seconds.     Coloration: Skin is not pale.     Findings: No abrasion, erythema, petechiae or rash. Rash is not papular, urticarial or vesicular.     Comments: No eczematous or urticarial lesions noted.  Neurological:     Mental Status: She is alert.  Psychiatric:        Mood and Affect: Mood and affect normal.        Behavior: Behavior is cooperative.      Diagnostic studies:    Spirometry: results abnormal (FEV1: 0.99/41%, FVC: 1.17/39%, FEV1/FVC: 85%).    Spirometry consistent with possible restrictive disease.   Allergy Studies: none        Malachi Bonds, MD  [facility]

## 2020-07-30 ENCOUNTER — Telehealth (HOSPITAL_COMMUNITY): Payer: Self-pay | Admitting: Physical Therapy

## 2020-07-30 ENCOUNTER — Encounter (HOSPITAL_COMMUNITY): Payer: Self-pay

## 2020-07-30 ENCOUNTER — Ambulatory Visit (HOSPITAL_COMMUNITY): Payer: Medicaid Other | Admitting: Physical Therapy

## 2020-07-30 NOTE — Telephone Encounter (Signed)
pt had to cx due to a family emergency

## 2020-07-31 ENCOUNTER — Other Ambulatory Visit: Payer: Self-pay | Admitting: *Deleted

## 2020-07-31 DIAGNOSIS — F419 Anxiety disorder, unspecified: Secondary | ICD-10-CM

## 2020-07-31 DIAGNOSIS — F339 Major depressive disorder, recurrent, unspecified: Secondary | ICD-10-CM

## 2020-08-01 ENCOUNTER — Telehealth (HOSPITAL_COMMUNITY): Payer: Self-pay

## 2020-08-01 ENCOUNTER — Ambulatory Visit (HOSPITAL_COMMUNITY): Payer: Medicaid Other

## 2020-08-01 NOTE — Telephone Encounter (Signed)
cx lack of transportation

## 2020-08-02 ENCOUNTER — Ambulatory Visit: Payer: Medicaid Other | Admitting: Pulmonary Disease

## 2020-08-06 ENCOUNTER — Ambulatory Visit (HOSPITAL_COMMUNITY): Payer: Medicaid Other | Attending: Internal Medicine | Admitting: Physical Therapy

## 2020-08-06 ENCOUNTER — Telehealth (HOSPITAL_COMMUNITY): Payer: Self-pay | Admitting: Physical Therapy

## 2020-08-06 DIAGNOSIS — M545 Low back pain, unspecified: Secondary | ICD-10-CM | POA: Insufficient documentation

## 2020-08-06 DIAGNOSIS — G8929 Other chronic pain: Secondary | ICD-10-CM | POA: Insufficient documentation

## 2020-08-06 DIAGNOSIS — R262 Difficulty in walking, not elsewhere classified: Secondary | ICD-10-CM | POA: Insufficient documentation

## 2020-08-06 DIAGNOSIS — M6281 Muscle weakness (generalized): Secondary | ICD-10-CM | POA: Insufficient documentation

## 2020-08-06 NOTE — Telephone Encounter (Signed)
Called patient about missed appointments. Patient said she could not make it today because she is having pain and recently had several seizures. She will try to make next appointment. Reminder about upcoming visit on Wednesday 08/08/20.  5:55 PM, 08/06/20 Georges Lynch PT DPT  Physical Therapist with [facility]  [phone]

## 2020-08-08 ENCOUNTER — Telehealth: Payer: Self-pay | Admitting: *Deleted

## 2020-08-08 ENCOUNTER — Ambulatory Visit (HOSPITAL_COMMUNITY): Payer: Medicaid Other

## 2020-08-08 MED ORDER — FLUCONAZOLE 150 MG PO TABS: ORAL_TABLET | ORAL | 1 refills | Status: DC

## 2020-08-08 NOTE — Telephone Encounter (Signed)
L/M for patient to return call to discuss starting Nucala. Have obtained approval and Dr Dellis Anes advised to reach to patient regarding start of therapy

## 2020-08-08 NOTE — Telephone Encounter (Signed)
Will rx diflucan  

## 2020-08-08 NOTE — Telephone Encounter (Signed)
Patient states she started her cycle 3 and today her vaginal area is very swollen along with internal and external itching. States her flow is still pretty heavy so she is unable to tell if there is a discharge but thinks she may have a yeast infection.  She is requesting Diflucan be sent in since she is on her period and does not want to use Monistat. Advised to check back with pharmacy later today if she did not hear back from our office.

## 2020-08-08 NOTE — Addendum Note (Signed)
Addended by: Cyril Mourning A on: 08/08/2020 02:13 PM   Modules accepted: Orders

## 2020-08-13 ENCOUNTER — Ambulatory Visit (HOSPITAL_COMMUNITY): Payer: Medicaid Other | Admitting: Physical Therapy

## 2020-08-13 ENCOUNTER — Telehealth (HOSPITAL_COMMUNITY): Payer: Self-pay | Admitting: Physical Therapy

## 2020-08-13 NOTE — Telephone Encounter (Signed)
pt cancelled appt for today because her asthma is bad

## 2020-08-14 ENCOUNTER — Telehealth: Payer: Self-pay

## 2020-08-14 ENCOUNTER — Other Ambulatory Visit: Payer: Self-pay | Admitting: Internal Medicine

## 2020-08-14 DIAGNOSIS — L309 Dermatitis, unspecified: Secondary | ICD-10-CM

## 2020-08-14 MED ORDER — TRIAMCINOLONE ACETONIDE 0.1 % EX CREA: 1.0000 "application " | TOPICAL_CREAM | Freq: Two times a day (BID) | CUTANEOUS | 0 refills | Status: DC

## 2020-08-14 NOTE — Telephone Encounter (Signed)
Pt called stating that in [place] she was prescribed Triamcinolone ointment in a tub for her eczema. She had refills on this but they have expired. She wanted to know if this is something you could send in for her to [facility] in [place]. Please advise.

## 2020-08-14 NOTE — Telephone Encounter (Signed)
Sent. Thanks.   

## 2020-08-14 NOTE — Telephone Encounter (Signed)
Pt informed via voicemail

## 2020-08-15 ENCOUNTER — Encounter: Payer: Self-pay | Admitting: Adult Health

## 2020-08-15 ENCOUNTER — Ambulatory Visit (INDEPENDENT_AMBULATORY_CARE_PROVIDER_SITE_OTHER): Payer: Medicaid Other | Admitting: Adult Health

## 2020-08-15 ENCOUNTER — Other Ambulatory Visit (HOSPITAL_COMMUNITY)
Admission: RE | Admit: 2020-08-15 | Discharge: 2020-08-15 | Disposition: A | Discharge status: Home / Self Care | Payer: Medicaid Other | Source: Ambulatory Visit | Attending: Adult Health | Admitting: Adult Health

## 2020-08-15 ENCOUNTER — Ambulatory Visit (HOSPITAL_COMMUNITY): Payer: Medicaid Other | Admitting: Physical Therapy

## 2020-08-15 ENCOUNTER — Other Ambulatory Visit: Payer: Self-pay

## 2020-08-15 VITALS — BP 143/95 | HR 92 | Ht 65.0 in | Wt 290.0 lb

## 2020-08-15 DIAGNOSIS — Z113 Encounter for screening for infections with a predominantly sexual mode of transmission: Secondary | ICD-10-CM | POA: Diagnosis present

## 2020-08-15 DIAGNOSIS — N898 Other specified noninflammatory disorders of vagina: Secondary | ICD-10-CM | POA: Diagnosis not present

## 2020-08-15 NOTE — Progress Notes (Signed)
  Subjective:     Patient ID: Elizabeth Bautista, female   DOB: 07-07-1975, 45 y.o.   MRN: [id number]  HPI Elizabeth Bautista is a 45 year old black female,single, G5P5 in complaining of vaginal itching.she has taken Diflucan and it helped some. PCP is Dr Allena Katz.  Review of Systems +vaginal itching   Reviewed past medical,surgical, social and family history. Reviewed medications and allergies.     Objective:   Physical Exam BP (!) 143/95 (BP Location: Left Arm, Patient Position: Sitting, Cuff Size: Large)   Pulse 92   Ht 5\' 5"  (1.651 m)   Wt 290 lb (131.5 kg)   LMP 08/06/2020   BMI 48.26 kg/m  Skin warm and dry.Pelvic: external genitalia is normal in appearance no lesions, vagina: white discharge without odor,urethra has no lesions or masses noted, cervix:smooth and bulbous, uterus: normal size, shape and contour, non tender, no masses felt, adnexa: no masses or tenderness noted. Bladder is non tender and no masses felt. CV swab obtained.    Fall risk is high, uses cane  [facility] - 08/15/20 1220      Pregnancy Intention Screening   Does the patient want to become pregnant in the next year? No    Does the patient's partner want to become pregnant in the next year? No    Would the patient like to discuss contraceptive options today? No      Contraception Wrap Up   Current Method Female Sterilization    End Method Female Sterilization    Contraception Counseling Provided No          Assessment:     1. Vaginal itching CV swab sent   2. Screening examination for STD (sexually transmitted disease) CV sent for GC/CHL,trich,BV and yeast     Plan:     Follow up prn

## 2020-08-16 LAB — CERVICOVAGINAL ANCILLARY ONLY
Bacterial Vaginitis (gardnerella): NEGATIVE
Candida Glabrata: NEGATIVE
Candida Vaginitis: NEGATIVE
Chlamydia: NEGATIVE
Comment: NEGATIVE
Comment: NEGATIVE
Comment: NEGATIVE
Comment: NEGATIVE
Comment: NEGATIVE
Comment: NORMAL
Neisseria Gonorrhea: NEGATIVE
Trichomonas: NEGATIVE

## 2020-08-17 ENCOUNTER — Telehealth: Payer: Self-pay

## 2020-08-17 NOTE — Telephone Encounter (Signed)
Im not sure what cream she is talking about can you please call and ask the name of the cream

## 2020-08-17 NOTE — Telephone Encounter (Signed)
Please send in the Ointment  Not the Cream   Need to place order 455 grams for Jar

## 2020-08-18 ENCOUNTER — Other Ambulatory Visit: Payer: Self-pay

## 2020-08-18 ENCOUNTER — Emergency Department (HOSPITAL_COMMUNITY)
Admission: EM | Admit: 2020-08-18 | Discharge: 2020-08-18 | Disposition: A | Discharge status: Home / Self Care | Payer: Medicaid Other | Attending: Emergency Medicine | Admitting: Emergency Medicine

## 2020-08-18 ENCOUNTER — Encounter (HOSPITAL_COMMUNITY): Payer: Self-pay

## 2020-08-18 DIAGNOSIS — J45909 Unspecified asthma, uncomplicated: Secondary | ICD-10-CM | POA: Diagnosis not present

## 2020-08-18 DIAGNOSIS — Z87891 Personal history of nicotine dependence: Secondary | ICD-10-CM | POA: Diagnosis not present

## 2020-08-18 DIAGNOSIS — E1165 Type 2 diabetes mellitus with hyperglycemia: Secondary | ICD-10-CM | POA: Diagnosis not present

## 2020-08-18 DIAGNOSIS — Z8616 Personal history of COVID-19: Secondary | ICD-10-CM | POA: Insufficient documentation

## 2020-08-18 DIAGNOSIS — Z79899 Other long term (current) drug therapy: Secondary | ICD-10-CM | POA: Insufficient documentation

## 2020-08-18 DIAGNOSIS — H00015 Hordeolum externum left lower eyelid: Secondary | ICD-10-CM | POA: Diagnosis not present

## 2020-08-18 DIAGNOSIS — Z7951 Long term (current) use of inhaled steroids: Secondary | ICD-10-CM | POA: Insufficient documentation

## 2020-08-18 DIAGNOSIS — H02826 Cysts of left eye, unspecified eyelid: Secondary | ICD-10-CM | POA: Diagnosis present

## 2020-08-18 DIAGNOSIS — I10 Essential (primary) hypertension: Secondary | ICD-10-CM | POA: Diagnosis not present

## 2020-08-18 MED ORDER — AZITHROMYCIN 1 % OP SOLN: 1.0000 [drp] | Freq: Every day | OPHTHALMIC | 0 refills | Status: DC

## 2020-08-18 MED ORDER — TRIAMCINOLONE ACETONIDE 0.5 % EX OINT: 1.0000 "application " | TOPICAL_OINTMENT | CUTANEOUS | 0 refills | Status: DC | PRN

## 2020-08-18 NOTE — ED Provider Notes (Signed)
[facility] EMERGENCY DEPARTMENT Provider Note   CSN: [id number] Arrival date & time: 08/18/20  1403     History Chief Complaint  Patient presents with  . Eye Pain    Elizabeth Bautista is a 45 y.o. female.  HPI   Patient with significant medical history of eczema, hypertension presents with chief complaint of cyst on left eyelid.  She endorses she first noticed this on Tuesday and has gotten worse since then.  She describes it as a bump on her left lower eyelid, she had spontaneous drainage in that eye, describes it as a clear drainage, she endorses irritation with movement of eyelid, denies decreased visual acuity, states she had some crusting of her eye.  She denies recent trauma to the area, has never had this happen before, does not wear contacts.  Patient denies  alleviating factors.  Patient denies headaches, fevers, chills, shortness of breath, chest pain, abdominal pain, nausea, vomiting, diarrhea, worsening pedal edema.  Past Medical History:  Diagnosis Date  . Anemia   . Asthma   . Diabetes ([place])    on Metformin  . Eczema   . Edema   . 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 HTN (hypertension)   . Rheumatoid arthritis ([place])    on Plaquenil   . Seizures ([place])    last seizure 2019  . Sleep apnea   . Sleep apnea     Patient Active Problem List   Diagnosis Date Noted  . Encounter for screening fecal occult blood testing 06/04/2020  . Encounter for gynecological examination with Papanicolaou smear of cervix 06/04/2020  . Screening examination for STD (sexually transmitted disease) 06/04/2020  . Dysmenorrhea 06/04/2020  . Menorrhagia with irregular cycle 06/04/2020  . Anxiety and depression 06/04/2020  . Encounter to establish care 05/24/2020  . Chronic low back pain 05/24/2020  . Physical deconditioning 05/24/2020  . COVID-19 virus infection 05/16/2020  . Asthma, moderate persistent  03/08/2020  . GERD (gastroesophageal reflux disease) 03/07/2020  . Anxiety 03/07/2020  . History of seizures 03/07/2020  . Upper airway cough syndrome 02/10/2020  . Swelling of lower extremity 02/01/2020  . Essential hypertension 02/01/2020  . Vitamin D deficiency 02/01/2020  . Type 2 diabetes mellitus with hyperglycemia, without long-term current use of insulin ([place]) 02/01/2020  . Anemia 02/01/2020  . Class 3 severe obesity with serious comorbidity and body mass index (BMI) of 45.0 to 49.9 in adult [facility]) 10/22/2019  . OSA on CPAP 10/18/2019  . Depression 10/18/2019  . Rheumatoid arthritis ([place]) 10/18/2019    Past Surgical History:  Procedure Laterality Date  . APPENDECTOMY    . CESAREAN SECTION     x3     OB History    Gravida  5   Para  5   Term  5   Preterm      AB      Living  5     SAB      IAB      Ectopic      Multiple      Live Births              Family History  Problem Relation Age of Onset  . High blood pressure Mother   . Diabetes Father   . Diabetes Sister   . Heart Problems Sister   . Diabetes Brother   . Diabetes Paternal Grandmother   .  Diabetes Other        father's side "everybody died from Diabetes"  . High blood pressure Other        mother's side, multiple siblings with this   . Heart attack Other        family member on mother's side   . Diabetes Paternal Aunt   . Seizures Cousin        not sibings to the other cousins with seizures  . Breast cancer Cousin   . Seizures Cousin        not sibings to the other cousins with seizures  . Seizures Cousin        not sibings to the other cousins with seizures  . Cervical cancer Maternal Aunt   . Breast cancer Cousin   . Dementia Maternal Aunt   . Asthma Maternal Aunt   . Heart Problems Maternal Grandmother   . Asthma Child   . Asthma Child   . Asthma Daughter   . Angioedema Daughter   . Asthma Son     Social History   Tobacco Use  . Smoking status: Former Smoker     Years: 10.00    Types: Cigarettes    Quit date: 06/02/2014    Years since quitting: 6.2  . Smokeless tobacco: Never Used  . Tobacco comment: during the 10 years of smoking, smoked 2-3 cigarettes/day  Vaping Use  . Vaping Use: Never used  Substance Use Topics  . Alcohol use: Not Currently  . Drug use: Not Currently    Home Medications Prior to Admission medications   Medication Sig Start Date End Date Taking? Authorizing Provider  azithromycin (AZASITE) 1 % ophthalmic solution Place 1 drop into the left eye daily for 5 days. 08/18/20 08/23/20 Yes Marcello Fennel, PA-C  triamcinolone ointment (KENALOG) 0.5 % Apply 1 application topically as needed. 08/18/20  Yes Marcello Fennel, PA-C  albuterol [facility]) 108 [phone] Base) MCG/ACT inhaler 2 puffs every 4 hours as needed only  if your can't catch your breath 02/09/20   Tanda Rockers, MD  albuterol (PROVENTIL) (2.5 MG/3ML) 0.083% nebulizer solution Take 3 mLs (2.5 mg total) by nebulization every 6 (six) hours as needed for wheezing or shortness of breath. 07/04/20   Chesley Mires, MD  amLODipine (NORVASC) 5 MG tablet Take 1 tablet (5 mg total) by mouth daily. 03/22/20   Doree Albee, MD  benzonatate (TESSALON) 200 MG capsule Take 1 capsule (200 mg total) by mouth 3 (three) times daily as needed for cough. 07/04/20   Chesley Mires, MD  blood glucose meter kit and supplies Dispense based on patient and insurance preference. Use up to four times daily as directed. (FOR ICD-10 E10.9, E11.9). 02/01/20   Ailene Ards, NP  Blood Pressure Monitoring [facility]) MISC 1 each by Does not apply route daily. 01/18/20   Ailene Ards, NP  budesonide (PULMICORT) 0.5 MG/2ML nebulizer solution Take 2 mLs (0.5 mg total) by nebulization 2 (two) times daily. 07/04/20   Chesley Mires, MD  busPIRone (BUSPAR) 7.5 MG tablet Take 7.5 mg by mouth 2 (two) times daily. 09/05/19   [provider]  cetirizine (ZYRTEC) 10 MG tablet Take 1 tablet (10 mg total) by  mouth daily. 06/08/20   Valentina Shaggy, MD  Cholecalciferol 1.25 MG (50000 UT) TABS Take 1 tablet by mouth daily. 02/01/20   Ailene Ards, NP  cyclobenzaprine (FLEXERIL) 10 MG tablet Take 10 mg by mouth 3 (three) times daily as  needed for muscle spasms.  08/27/19   [provider]  escitalopram (LEXAPRO) 20 MG tablet Take 1 tablet by mouth every evening. 02/29/20   [provider]  famotidine (PEPCID) 20 MG tablet Take 20 mg by mouth daily. 07/31/19   [provider]  ferrous sulfate 325 (65 FE) MG tablet Take 325 mg by mouth 2 (two) times daily. 05/17/19   [provider]  fluconazole (DIFLUCAN) 150 MG tablet Take 1 now and 1 in 3 days 08/08/20   Derrek Monaco A, NP  fluticasone (CUTIVATE) 0.005 % ointment Apply 1 application topically daily. 10/11/19   [provider]  fluticasone (FLONASE) 50 MCG/ACT nasal spray Place 1 spray into both nostrils daily. 07/25/20   Valentina Shaggy, MD  formoterol (PERFOROMIST) 20 MCG/2ML nebulizer solution Take 2 mLs (20 mcg total) by nebulization 2 (two) times daily. 07/04/20   Chesley Mires, MD  furosemide (LASIX) 20 MG tablet Take 20 mg by mouth daily. 03/09/20   [provider]  gabapentin (NEURONTIN) 100 MG capsule Take 2 capsules (200 mg total) by mouth 3 (three) times daily. 06/12/20   Ward Givens, NP  hydrochlorothiazide (HYDRODIURIL) 25 MG tablet Take 1 tablet (25 mg total) by mouth daily. For high blood pressure. 02/01/20   Ailene Ards, NP  hydroxychloroquine (PLAQUENIL) 200 MG tablet Take 1 tablet (200 mg total) by mouth 2 (two) times daily. 02/20/20   Ailene Ards, NP  hydrOXYzine (ATARAX/VISTARIL) 25 MG tablet Take 25 mg by mouth 2 (two) times daily. 02/01/20   [provider]  ibuprofen (ADVIL) 800 MG tablet Take 1 tablet (800 mg total) by mouth every 8 (eight) hours as needed. 02/20/20   Ailene Ards, NP  KLOR-CON M20 20 MEQ tablet TAKE 1 TABLET BY MOUTH DAILY ONLY WHEN YOU TAKE  FUROSEMIDE. Patient taking differently: 20 mEq daily. Only when you take Furosemide. 03/05/20   Doree Albee, MD  levETIRAcetam (KEPPRA) 500 MG tablet Take 1 tablet (500 mg total) by mouth 2 (two) times daily. 03/13/20 04/12/20  Manuella Ghazi, Pratik D, DO  metoprolol tartrate (LOPRESSOR) 50 MG tablet TAKE 1 TABLET 2 HR PRIOR TO CARDIAC PROCEDURE 04/16/20   Buford Dresser, MD  mirtazapine (REMERON) 30 MG tablet Take 30 mg by mouth at bedtime. 03/30/20   [provider]  montelukast (SINGULAIR) 10 MG tablet Take 1 tablet (10 mg total) by mouth at bedtime. 07/25/20 08/24/20  Valentina Shaggy, MD  pantoprazole (PROTONIX) 40 MG tablet Take 1 tablet (40 mg total) by mouth daily. Take 30-60 min before first meal of the day 02/09/20   Tanda Rockers, MD  prazosin (MINIPRESS) 1 MG capsule Take 1 mg by mouth at bedtime. 02/29/20   [provider]  QUEtiapine (SEROQUEL) 25 MG tablet Take 25 mg by mouth 2 (two) times daily.  09/05/19   [provider]  triamcinolone (KENALOG) 0.1 % Apply 1 application topically 2 (two) times daily. 08/14/20   [name], MD    Allergies    Phenytoin and Phenylbutazones  Review of Systems   Review of Systems  Constitutional: Negative for chills and fever.  HENT: Negative for congestion and sore throat.   Eyes: Positive for pain, discharge and itching. Negative for photophobia, redness and visual disturbance.  Respiratory: Negative for shortness of breath.   Cardiovascular: Negative for chest pain.  Gastrointestinal: Negative for abdominal pain.  Genitourinary: Negative for enuresis.  Musculoskeletal: Negative for back pain.  Skin: Negative for rash.  Neurological: Negative for dizziness.  Hematological: Does not bruise/bleed easily.    Physical Exam Updated Vital Signs BP 139/88   Pulse 86   Temp 98.2 F (36.8 C)   Resp 19   LMP 08/06/2020   SpO2 98%   Physical Exam Vitals and nursing note reviewed.  Constitutional:       General: She is not in acute distress.    Appearance: She is not ill-appearing.  HENT:     Head: Normocephalic and atraumatic.     Nose: No congestion.  Eyes:     General: No scleral icterus.       Right eye: No discharge.        Left eye: No discharge.     Extraocular Movements: Extraocular movements intact.     Conjunctiva/sclera: Conjunctivae normal.     Pupils: Pupils are equal, round, and reactive to light.     Comments: Patient's left eye was visualized, there is noted crusting along the bottom left eyelid, with a papule along the medial one third of the lower left eyelid.  There is no surrounding erythema, no drainage or discharge present.  There is no noted scleral injection, EOMs were intact, PERRLA, visual acuity intact.  She was slightly tender to palpation under her left eyelid, no fluctuance or induration present.  Cardiovascular:     Rate and Rhythm: Normal rate and regular rhythm.  Pulmonary:     Effort: Pulmonary effort is normal.  Musculoskeletal:     Comments: Moving all 4 extremities at difficulty  Skin:    General: Skin is warm and dry.  Neurological:     Mental Status: She is alert.  Psychiatric:        Mood and Affect: Mood normal.     ED Results / Procedures / Treatments   Labs (all labs ordered are listed, but only abnormal results are displayed) Labs Reviewed - No data to display  EKG None  Radiology No results found.  Procedures Procedures   Medications Ordered in ED Medications - No data to display  ED Course  I have reviewed the triage vital signs and the nursing notes.  Pertinent labs & imaging results that were available during my care of the patient were reviewed by me and considered in my medical decision making (see chart for details).    MDM Rules/Calculators/A&P                         Initial impression-patient presents with irritation of left eyelid.  She is alert, does not appear in acute distress, vital signs  reassuring.  Work-up-due to well-appearing patient, benign physical exam, further lab work and imaging not warranted at this time.  Rule out- I have low suspicion for orbital cellulitis or periseptal cellulitis as patient had no pain with EOMs, there is no induration, fluctuance noted  around eyes.  Low suspicion for keratitis as patient does not wear contacts, eyes were visualized there is no visible dendritic lesion noted, there is no severe amount of purulent discharge noted.  Low suspicion for glaucoma as patient denies painful visual changes.  Low suspicion for hyphema as patient denies recent trauma to the area no blood noted in the anterior chamber.   Plan-suspect patient suffering from a stye, will encourage warm compresses daily, prophylactic placed on azithromycin for infection, have her follow-up with ophthalmology 3 days time if symptoms do not resolve  Vital signs have remained stable, no indication for hospital admission.  Patient given at home care as well strict return precautions.  Patient verbalized that they understood agreed to said plan.   Final Clinical Impression(s) / ED Diagnoses Final diagnoses:  Hordeolum externum of left lower eyelid    Rx / DC Orders ED Discharge Orders         Ordered    azithromycin (AZASITE) 1 % ophthalmic solution  Daily        08/18/20 1454    triamcinolone ointment (KENALOG) 0.5 %  As needed        08/18/20 1454           Aron Baba 08/18/20 1525    Hayden Rasmussen, MD 08/18/20 1728

## 2020-08-18 NOTE — Discharge Instructions (Signed)
You have a stye on your left eye.  These generally will go away on their own, I need you to apply warm compresses to the area 5 times a day and  try to massage the area to help expel the stuff inside.  You may take over-the-counter pain medications like ibuprofen and/or Tylenol every 6 hours as needed please follow dosing on back of bottle.  I have also started you on antibiotics please apply 1 drop to the eye 1 time a day for next 5 days.  If it does not go away by Monday I want you to follow-up with ophthalmology for further evaluation.  Given the contact information above please call to schedule an appointment  Come back to the emergency department if you develop chest pain, shortness of breath, severe abdominal pain, uncontrolled nausea, vomiting, diarrhea.

## 2020-08-18 NOTE — ED Triage Notes (Signed)
Pt presents with complaints of left eye pain and irritation. Patient woke up to a stye on her left eye Tuesday morning. Reports she has had continued swelling, pain and drainage since. Pt has not used any medication at home, just warm compresses. Patient does not have glasses, unable to complete visual acuity.  Patient is also requesting a medication for her eczema.

## 2020-08-20 ENCOUNTER — Other Ambulatory Visit: Payer: Self-pay

## 2020-08-20 ENCOUNTER — Ambulatory Visit (HOSPITAL_COMMUNITY): Payer: Medicaid Other | Admitting: Physical Therapy

## 2020-08-20 ENCOUNTER — Encounter (HOSPITAL_COMMUNITY): Payer: Self-pay | Admitting: Physical Therapy

## 2020-08-20 DIAGNOSIS — R262 Difficulty in walking, not elsewhere classified: Secondary | ICD-10-CM

## 2020-08-20 DIAGNOSIS — G8929 Other chronic pain: Secondary | ICD-10-CM

## 2020-08-20 DIAGNOSIS — M6281 Muscle weakness (generalized): Secondary | ICD-10-CM

## 2020-08-20 DIAGNOSIS — M545 Low back pain, unspecified: Secondary | ICD-10-CM

## 2020-08-20 NOTE — Telephone Encounter (Signed)
L/m for patient to contact me  

## 2020-08-20 NOTE — Therapy (Addendum)
[facility] [address] [place], [place], [address] Phone: [phone]   Fax:  [phone]  Physical Therapy Treatment  Patient Details  Name: Elizabeth Bautista MRN: [id number] Date of Birth: 22-Sep-1975 Referring Provider (PT): Patterson Hammersmith  Progress Note Reporting Period 07/16/20 to 08/20/20  See note below for Objective Data and Assessment of Progress/Goals.       Encounter Date: 08/20/2020   PT End of Session - 08/20/20 1757    Visit Number 3    Number of Visits 12    Date for PT Re-Evaluation 09/17/20    Authorization Type Medicaid [place]    Authorization Time Period 12 approved 3/7-4/17    Authorization - Visit Number 1    Authorization - Number of Visits 12    Progress Note Due on Visit 10    PT Start Time 1555    PT Stop Time 1635    PT Time Calculation (min) 40 min    Activity Tolerance Patient limited by fatigue;Patient limited by pain    Behavior During Therapy [facility] for tasks assessed/performed           Past Medical History:  Diagnosis Date  . Anemia   . Asthma   . Diabetes (HCC)    on Metformin  . Eczema   . Edema   . 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 HTN (hypertension)   . Rheumatoid arthritis (HCC)    on Plaquenil   . Seizures (HCC)    last seizure 2019  . Sleep apnea   . Sleep apnea     Past Surgical History:  Procedure Laterality Date  . APPENDECTOMY    . CESAREAN SECTION     x3    There were no vitals filed for this visit.   Subjective Assessment - 08/20/20 1750    Subjective Patient states she has been unable to attend therapy visits over the past month per ongoing medical issues. She says she has had approximately 8 seizures in that time frame and has been in a lot of pain as a result. She also has been having increased difficulty walking and with mobility due to increased leg weakness following bouts of  seizures. She says she has been doing home exercises issues from evaluation and initial therapy visits. She reports overall 0% improvement in function since starting therapy, with a total of 3 visits with last visit attended 07/25/20.    Limitations Sitting;Lifting;Standing;Walking    How long can you sit comfortably? 5-10 min    How long can you stand comfortably? 5-10 min    How long can you walk comfortably? 5-10 min    Currently in Pain? Yes    Pain Score 10-Worst pain ever    Pain Location Back    Pain Orientation Left;Posterior;Lower    Pain Descriptors / Indicators Aching;Sharp;Shooting    Pain Type Chronic pain    Pain Onset More than a month ago    Pain Frequency Constant    Aggravating Factors  Walking, WB, Seizures    Pain Relieving Factors Rest, meds    Effect of Pain on Daily Activities Limits    Multiple Pain Sites Yes    Pain Score 10    Pain Location Leg    Pain Orientation Left    Pain Descriptors / Indicators Aching;Burning;Sharp;Shooting    Pain Type Chronic pain  Pain Onset More than a month ago    Pain Frequency Constant    Aggravating Factors  Walking, WB, Seizures    Pain Relieving Factors Rest, meds    Effect of Pain on Daily Activities Limits              [facility] PT Assessment - 08/20/20 0001      Assessment   Medical Diagnosis chronic LBP    Referring Provider (PT) Rutwick Patel      Precautions   Precautions Fall      Balance Screen   Has the patient fallen in the past 6 months Yes    How many times? 1    Has the patient had a decrease in activity level because of a fear of falling?  Yes    Is the patient reluctant to leave their home because of a fear of falling?  Yes      Home Environment   Living Environment Private residence    Living Arrangements Children    Available Help at Discharge Family      Prior Function   Level of Independence Requires assistive device for independence;Needs assistance with ADLs;Needs assistance with  homemaking      Cognition   Overall Cognitive Status Within Functional Limits for tasks assessed      Strength   Right Hip Flexion 3/5   noted cogwheeling   Left Hip Flexion 3-/5   noted cogwheeling   Right Knee Extension 3/5   noted cogwheeling   Left Knee Extension 3-/5   noted cogwheeling   Right Ankle Dorsiflexion 3/5    Left Ankle Dorsiflexion 3-/5      Transfers   Transfers Sit to Stand    Sit to Stand 6: Modified independent (Device/Increase time)    Comments Heavy use of both arms and AD      Ambulation/Gait   Ambulation/Gait Yes    Ambulation/Gait Assistance 5: Supervision    Ambulation Distance (Feet) 51 Feet    Assistive device Straight cane    Gait Pattern Decreased hip/knee flexion - left;Decreased dorsiflexion - left;Decreased dorsiflexion - right;Decreased stance time - left;Decreased step length - right;Decreased step length - left;Decreased stride length;Antalgic;Poor foot clearance - left    Gait Comments                     Adult Aquatic Therapy - 08/20/20 1821      Treatment   Exercises heel raise (unilateral) x 10 each, toe raise (unilateral) x 10 each, knee flexion x 10 each, sidestepping at wall x 3, standing balance 2 x 15", gait with pool dumbbells 20 feet, lunge stretch 10 x 5"                      PT Education - 08/20/20 1757    Education Details On reassessment findings and POC    Person(s) Educated Patient    Methods Explanation    Comprehension Verbalized understanding            PT Short Term Goals - 08/20/20 1759      PT SHORT TERM GOAL #1   Title Patient will report at least 25% improvement in symptoms for improved quality of life.    Baseline Reports 0%    Time 3    Period Weeks    Status On-going    Target Date 08/06/20      PT SHORT TERM GOAL #2   Title Patient will  report back/LLE pain not exceeding 4/10 with LE strengthening to improve HEP compliance    Baseline 10/10 pain average    Time 3     Period Weeks    Status On-going    Target Date 08/06/20      PT SHORT TERM GOAL #3   Title Patient will be independent with HEP in order to improve functional outcomes.    Baseline Reports compliance    Time 3    Period Weeks    Status Achieved    Target Date 08/06/20             PT Long Term Goals - 08/20/20 1800      PT LONG TERM GOAL #1   Title Patient will demonstrate improved ambulation velocity and tolerance as evidenced by 150 ft during using least restrictive AD    Baseline Currently 51 ft with cane and gait deviations    Time 6    Period Weeks    Status On-going      PT LONG TERM GOAL #2   Title Patient will manifest decreased disability due to back pain as evidenced by score of 28/50 for Oswestry Low Back Pain Disability Questionnaire    Baseline Reduced function presently per recent medical issues    Time 6    Period Weeks    Status Deferred                 Plan - 08/20/20 1808    Clinical Impression Statement Patient returns to therapy after 4 week absence due to medical issues. Patient shows slight decrease in functional level and presents with increased pain. Patient has recently started new medication for seizures and associated pain and would like to continue with physical therapy for pain and for improving functional mobility and gait. Patient continues to be significantly limited by decreased strength, poor balance, significant gait deficit and elevated pain which are negatively impacting ability to perform ADLs. Patient would continue to benefit from skilled therapy services to address these deficits for improved functional mobility and reduce risk for future falls.    Personal Factors and Comorbidities Comorbidity 1;Time since onset of injury/illness/exacerbation;Fitness;Past/Current Experience    Comorbidities PMH    Examination-Activity Limitations Bathing;Bed Mobility;Bend;Caring for Others;Carry;Lift;Stand;Stairs;Squat;Sit;Locomotion  Level;Transfers    Examination-Participation Restrictions Church;Cleaning;Community Activity;Interpersonal Relationship;Laundry;Shop;Meal Prep    Stability/Clinical Decision Making Stable/Uncomplicated    Rehab Potential Fair    PT Frequency 2x / week    PT Duration 6 weeks    PT Treatment/Interventions ADLs/Self Care Home Management;Aquatic Therapy;Biofeedback;Cryotherapy;Electrical Stimulation;DME Instruction;Traction;Gait training;Stair training;Functional mobility training;Therapeutic activities;Therapeutic exercise;Balance training;Patient/family education;Neuromuscular re-education;Manual techniques;Passive range of motion;Taping;Energy conservation;Dry needling;Spinal Manipulations;Joint Manipulations    PT Next Visit Plan continue to progress exercise routine for LE strength; may try standing as supine was difficult for patient.    PT Home Exercise Plan 2/23:  seated heel/toe raise, LAQ, sit to stands    Consulted and Agree with Plan of Care Patient           Patient will benefit from skilled therapeutic intervention in order to improve the following deficits and impairments:  Abnormal gait,Decreased activity tolerance,Decreased balance,Decreased mobility,Decreased knowledge of use of DME,Decreased endurance,Decreased range of motion,Decreased strength,Difficulty walking,Impaired perceived functional ability,Postural dysfunction,Improper body mechanics,Obesity,Pain  Visit Diagnosis: Difficulty in walking, not elsewhere classified - Plan: PT plan of care cert/re-cert  Muscle weakness (generalized) - Plan: PT plan of care cert/re-cert  Chronic low back pain, unspecified back pain laterality, unspecified whether sciatica present - Plan: PT plan  of care cert/re-cert     Problem List Patient Active Problem List   Diagnosis Date Noted  . Encounter for screening fecal occult blood testing 06/04/2020  . Encounter for gynecological examination with Papanicolaou smear of cervix  06/04/2020  . Screening examination for STD (sexually transmitted disease) 06/04/2020  . Dysmenorrhea 06/04/2020  . Menorrhagia with irregular cycle 06/04/2020  . Anxiety and depression 06/04/2020  . Encounter to establish care 05/24/2020  . Chronic low back pain 05/24/2020  . Physical deconditioning 05/24/2020  . COVID-19 virus infection 05/16/2020  . Asthma, moderate persistent 03/08/2020  . GERD (gastroesophageal reflux disease) 03/07/2020  . Anxiety 03/07/2020  . History of seizures 03/07/2020  . Upper airway cough syndrome 02/10/2020  . Swelling of lower extremity 02/01/2020  . Essential hypertension 02/01/2020  . Vitamin D deficiency 02/01/2020  . Type 2 diabetes mellitus with hyperglycemia, without long-term current use of insulin (HCC) 02/01/2020  . Anemia 02/01/2020  . Class 3 severe obesity with serious comorbidity and body mass index (BMI) of 45.0 to 49.9 in adult [facility]) 10/22/2019  . OSA on CPAP 10/18/2019  . Depression 10/18/2019  . Rheumatoid arthritis (HCC) 10/18/2019    6:24 PM, 08/20/20 Georges Lynch PT DPT  Physical Therapist with [place]  [facility]  [phone]   [facility] [address] [place], [place], [address] Phone: [phone]   Fax:  [phone]  Name: Tyia Binford MRN: [id number] Date of Birth: 1975/11/28

## 2020-08-20 NOTE — Telephone Encounter (Signed)
T/c from patient advised she had missed call but no messages.  Advised patient of approval and submit for Nucala to [facility] and will be in touch once delivery set to advise appt to start therapy

## 2020-08-21 ENCOUNTER — Ambulatory Visit (HOSPITAL_COMMUNITY): Payer: Medicaid Other

## 2020-08-22 ENCOUNTER — Ambulatory Visit (INDEPENDENT_AMBULATORY_CARE_PROVIDER_SITE_OTHER): Payer: Medicaid Other | Admitting: Internal Medicine

## 2020-08-22 ENCOUNTER — Other Ambulatory Visit: Payer: Self-pay

## 2020-08-22 ENCOUNTER — Encounter: Payer: Self-pay | Admitting: Internal Medicine

## 2020-08-22 VITALS — BP 161/94 | HR 85 | Resp 18 | Ht 65.0 in | Wt 290.1 lb

## 2020-08-22 DIAGNOSIS — F32A Depression, unspecified: Secondary | ICD-10-CM

## 2020-08-22 DIAGNOSIS — M069 Rheumatoid arthritis, unspecified: Secondary | ICD-10-CM | POA: Diagnosis not present

## 2020-08-22 DIAGNOSIS — M7989 Other specified soft tissue disorders: Secondary | ICD-10-CM

## 2020-08-22 DIAGNOSIS — J4540 Moderate persistent asthma, uncomplicated: Secondary | ICD-10-CM

## 2020-08-22 DIAGNOSIS — H00016 Hordeolum externum left eye, unspecified eyelid: Secondary | ICD-10-CM

## 2020-08-22 DIAGNOSIS — F419 Anxiety disorder, unspecified: Secondary | ICD-10-CM

## 2020-08-22 DIAGNOSIS — I10 Essential (primary) hypertension: Secondary | ICD-10-CM

## 2020-08-22 DIAGNOSIS — Z6841 Body Mass Index (BMI) 40.0 and over, adult: Secondary | ICD-10-CM

## 2020-08-22 DIAGNOSIS — L309 Dermatitis, unspecified: Secondary | ICD-10-CM

## 2020-08-22 MED ORDER — TOBRAMYCIN-DEXAMETHASONE 0.3-0.1 % OP OINT: 1.0000 "application " | TOPICAL_OINTMENT | Freq: Three times a day (TID) | OPHTHALMIC | 0 refills | Status: DC

## 2020-08-22 MED ORDER — HYDROCHLOROTHIAZIDE 25 MG PO TABS: 25.0000 mg | ORAL_TABLET | Freq: Every day | ORAL | 0 refills | Status: DC

## 2020-08-22 MED ORDER — TRIAMCINOLONE ACETONIDE 0.5 % EX OINT: 1.0000 "application " | TOPICAL_OINTMENT | CUTANEOUS | 0 refills | Status: DC | PRN

## 2020-08-22 MED ORDER — TRIAMCINOLONE ACETONIDE 0.025 % EX OINT: 1.0000 "application " | TOPICAL_OINTMENT | Freq: Two times a day (BID) | CUTANEOUS | 0 refills | Status: DC

## 2020-08-22 MED ORDER — ALBUTEROL SULFATE HFA 108 (90 BASE) MCG/ACT IN AERS: INHALATION_SPRAY | RESPIRATORY_TRACT | 11 refills | Status: DC

## 2020-08-22 MED ORDER — BUDESONIDE 0.5 MG/2ML IN SUSP: 0.5000 mg | Freq: Two times a day (BID) | RESPIRATORY_TRACT | 12 refills | Status: DC

## 2020-08-22 MED ORDER — AMLODIPINE BESYLATE 5 MG PO TABS: 5.0000 mg | ORAL_TABLET | Freq: Every day | ORAL | 3 refills | Status: DC

## 2020-08-22 NOTE — Patient Instructions (Signed)
Please bring your medications in the next visit.  You are being referred to Psychiatry, Rheumatology and Bariatric surgery.  Please use eye ointment for stye. Please apply warm compresses for stye as well.  Apply Kenalog ointment for eczema.

## 2020-08-22 NOTE — Telephone Encounter (Signed)
Pt came on for an appt

## 2020-08-23 ENCOUNTER — Ambulatory Visit (HOSPITAL_COMMUNITY): Payer: Medicaid Other | Admitting: Physical Therapy

## 2020-08-23 ENCOUNTER — Encounter (HOSPITAL_COMMUNITY): Payer: Self-pay

## 2020-08-24 NOTE — Assessment & Plan Note (Signed)
On multiple medications - but has not brought home medications for review Referred to Psychiatry for urgent evaluation as patient has severe depression and anxiety

## 2020-08-24 NOTE — Assessment & Plan Note (Signed)
Lasix PRN Leg elevation and compression stocking Weight loss would be beneficial as well.

## 2020-08-24 NOTE — Progress Notes (Signed)
Established Patient Office Visit  Subjective:  Patient ID: Elizabeth Bautista, female    DOB: [birth year]  Age: 45 y.o. MRN: [id number]  CC:  Chief Complaint  Patient presents with  . Follow-up    3 month follow up having pain in back and legs brought records for [place] to send to arthrits dr from referral because they keep saying they never received records     HPI Elizabeth Bautista is a 45 year old female with PMH of HTN, OSA on CPAP, asthma, GERD, RA, depression with anxiety, chronic LE swelling, seizure disorder, chronic low back pain and morbid obesity who presents for follow up of her chronic medical conditions.  She could not visit Rheumatologist, as they were not able to obtain her records. She has brought her records to the office today and they have been faxed to the Rheumatology office.  She also could not see Psychiatrist and has been feeling depressed. She was advised to contact us with her home medications in the last visit, but it has not been communicated yet. She has not brought her medications today. She has a crying affect today. She has been provided with a new Psychiatry referral.  She c/o wheezing and dyspnea for last few days. She has been using her Pulmicort and Albuterol nebs. Denies any fever, chills, sore throat, nasal congestion.  She had an ER visit for hordeolum, and was given eye drops, but she was not able to receive it as it is not in stock. She requests a different eye drop. Denies any blurry vision, but has difficulty keeping her left eye open due to stickiness and discomfort.  She also c/o upper back rash with itching. She used to use Kenalog ointment for it. She does not want to try cream that was prescribed to it.  Past Medical History:  Diagnosis Date  . Anemia   . Asthma   . COVID-19 virus infection 05/16/2020   04/27/20 dx covid-19, not hospitalized  . Diabetes ([place])    on Metformin  . Eczema   . Edema   . Heart problem    "something with the arteries on  the left side of the heart"; upcoming appt with cardiology for evaluation  . HTN (hypertension)   . Rheumatoid arthritis ([place])    on Plaquenil   . Seizures ([place])    last seizure 2019  . Sleep apnea   . Sleep apnea     Past Surgical History:  Procedure Laterality Date  . APPENDECTOMY    . CESAREAN SECTION     x3    Family History  Problem Relation Age of Onset  . High blood pressure Mother   . Diabetes Father   . Diabetes Sister   . Heart Problems Sister   . Diabetes Brother   . Diabetes Paternal Grandmother   . Diabetes Other        father's side "everybody died from Diabetes"  . High blood pressure Other        mother's side, multiple siblings with this   . Heart attack Other        family member on mother's side   . Diabetes Paternal Aunt   . Seizures Cousin        not sibings to the other cousins with seizures  . Breast cancer Cousin   . Seizures Cousin        not sibings to the other cousins with seizures  . Seizures Cousin        not  sibings to the other cousins with seizures  . Cervical cancer Maternal Aunt   . Breast cancer Cousin   . Dementia Maternal Aunt   . Asthma Maternal Aunt   . Heart Problems Maternal Grandmother   . Asthma Child   . Asthma Child   . Asthma Daughter   . Angioedema Daughter   . Asthma Son     Social History   Socioeconomic History  . Marital status: Single    Spouse name: Not on file  . Number of children: 5  . Years of education: Not on file  . Highest education level: 9th grade  Occupational History  . Not on file  Tobacco Use  . Smoking status: Former Smoker    Years: 10.00    Types: Cigarettes    Quit date: 06/02/2014    Years since quitting: 6.2  . Smokeless tobacco: Never Used  . Tobacco comment: during the 10 years of smoking, smoked 2-3 cigarettes/day  Vaping Use  . Vaping Use: Never used  Substance and Sexual Activity  . Alcohol use: Not Currently  . Drug use: Not Currently  . Sexual activity: Not  Currently    Birth control/protection: Abstinence, Surgical    Comment: tubal  Other Topics Concern  . Not on file  Social History Narrative   Divorced.Lives with 3 kids.Originally from [place].Came from [place] 7 months ago.      02/27/2020   Right handed   Caffeine: none    Social Determinants of Health   Financial Resource Strain: High Risk  . Difficulty of Paying Living Expenses: Very hard  Food Insecurity: Food Insecurity Present  . Worried About Charity fundraiser in the Last Year: Often true  . Ran Out of Food in the Last Year: Often true  Transportation Needs: No Transportation Needs  . Lack of Transportation (Medical): No  . Lack of Transportation (Non-Medical): No  Physical Activity: Inactive  . Days of Exercise per Week: 0 days  . Minutes of Exercise per Session: 0 min  Stress: Stress Concern Present  . Feeling of Stress : Very much  Social Connections: Socially Isolated  . Frequency of Communication with Friends and Family: Once a week  . Frequency of Social Gatherings with Friends and Family: Never  . Attends Religious Services: Never  . Active Member of Clubs or Organizations: No  . Attends Archivist Meetings: Never  . Marital Status: Divorced  Human resources officer Violence: Not At Risk  . Fear of Current or Ex-Partner: No  . Emotionally Abused: No  . Physically Abused: No  . Sexually Abused: No    Outpatient Medications Prior to Visit  Medication Sig Dispense Refill  . albuterol (PROVENTIL) (2.5 MG/3ML) 0.083% nebulizer solution Take 3 mLs (2.5 mg total) by nebulization every 6 (six) hours as needed for wheezing or shortness of breath. 360 mL 5  . blood glucose meter kit and supplies Dispense based on patient and insurance preference. Use up to four times daily as directed. (FOR ICD-10 E10.9, E11.9). 1 each 0  . Blood Pressure Monitoring (SPHYGMOMANOMETER) MISC 1 each by Does not apply route daily. 1 each 0  . busPIRone (BUSPAR) 7.5 MG tablet Take  7.5 mg by mouth 2 (two) times daily.    . cetirizine (ZYRTEC) 10 MG tablet Take 1 tablet (10 mg total) by mouth daily. 30 tablet 5  . Cholecalciferol 1.25 MG (50000 UT) TABS Take 1 tablet by mouth daily. 90 tablet 0  . cyclobenzaprine (FLEXERIL)  10 MG tablet Take 10 mg by mouth 3 (three) times daily as needed for muscle spasms.     Marland Kitchen escitalopram (LEXAPRO) 20 MG tablet Take 1 tablet by mouth every evening.    . famotidine (PEPCID) 20 MG tablet Take 20 mg by mouth daily.    . ferrous sulfate 325 (65 FE) MG tablet Take 325 mg by mouth 2 (two) times daily.    . fluconazole (DIFLUCAN) 150 MG tablet Take 1 now and 1 in 3 days 2 tablet 1  . fluticasone (CUTIVATE) 0.005 % ointment Apply 1 application topically daily.    . fluticasone (FLONASE) 50 MCG/ACT nasal spray Place 1 spray into both nostrils daily. 16 g 5  . formoterol (PERFOROMIST) 20 MCG/2ML nebulizer solution Take 2 mLs (20 mcg total) by nebulization 2 (two) times daily. 120 mL 11  . furosemide (LASIX) 20 MG tablet Take 20 mg by mouth daily.    Marland Kitchen gabapentin (NEURONTIN) 100 MG capsule Take 2 capsules (200 mg total) by mouth 3 (three) times daily. 180 capsule 3  . hydroxychloroquine (PLAQUENIL) 200 MG tablet Take 1 tablet (200 mg total) by mouth 2 (two) times daily. 60 tablet 1  . hydrOXYzine (ATARAX/VISTARIL) 25 MG tablet Take 25 mg by mouth 2 (two) times daily.    Marland Kitchen ibuprofen (ADVIL) 800 MG tablet Take 1 tablet (800 mg total) by mouth every 8 (eight) hours as needed. 30 tablet 0  . KLOR-CON M20 20 MEQ tablet TAKE 1 TABLET BY MOUTH DAILY ONLY WHEN YOU TAKE FUROSEMIDE. (Patient taking differently: 20 mEq daily. Only when you take Furosemide.) 30 tablet 3  . levETIRAcetam (KEPPRA) 500 MG tablet Take 1 tablet (500 mg total) by mouth 2 (two) times daily. 60 tablet 2  . metoprolol tartrate (LOPRESSOR) 50 MG tablet TAKE 1 TABLET 2 HR PRIOR TO CARDIAC PROCEDURE 1 tablet 0  . mirtazapine (REMERON) 30 MG tablet Take 30 mg by mouth at bedtime.    .  montelukast (SINGULAIR) 10 MG tablet Take 1 tablet (10 mg total) by mouth at bedtime. 30 tablet 5  . pantoprazole (PROTONIX) 40 MG tablet Take 1 tablet (40 mg total) by mouth daily. Take 30-60 min before first meal of the day 30 tablet 2  . prazosin (MINIPRESS) 1 MG capsule Take 1 mg by mouth at bedtime.    Marland Kitchen QUEtiapine (SEROQUEL) 25 MG tablet Take 25 mg by mouth 2 (two) times daily.     Marland Kitchen albuterol (PROAIR HFA) 108 (90 Base) MCG/ACT inhaler 2 puffs every 4 hours as needed only  if your can't catch your breath 18 g 11  . amLODipine (NORVASC) 5 MG tablet Take 1 tablet (5 mg total) by mouth daily. 30 tablet 3  . azithromycin (AZASITE) 1 % ophthalmic solution Place 1 drop into the left eye daily for 5 days. 0.3 mL 0  . benzonatate (TESSALON) 200 MG capsule Take 1 capsule (200 mg total) by mouth 3 (three) times daily as needed for cough. (Patient not taking: Reported on 08/22/2020) 30 capsule 1  . budesonide (PULMICORT) 0.5 MG/2ML nebulizer solution Take 2 mLs (0.5 mg total) by nebulization 2 (two) times daily. 120 mL 12  . hydrochlorothiazide (HYDRODIURIL) 25 MG tablet Take 1 tablet (25 mg total) by mouth daily. For high blood pressure. 90 tablet 0  . triamcinolone (KENALOG) 0.1 % Apply 1 application topically 2 (two) times daily. 30 g 0  . triamcinolone ointment (KENALOG) 0.5 % Apply 1 application topically as needed. 30 g 0  No facility-administered medications prior to visit.    Allergies  Allergen Reactions  . Phenytoin Anaphylaxis    Other reaction(s): swelling and heart rate decreased  . Phenylbutazones     ROS Review of Systems  Constitutional: Positive for fatigue. Negative for chills and fever.  HENT: Negative for congestion, sinus pressure, sinus pain and sore throat.   Eyes: Positive for pain and itching. Negative for discharge.  Respiratory: Negative for cough and shortness of breath.   Cardiovascular: Negative for chest pain and palpitations.  Gastrointestinal: Negative for  abdominal pain, constipation, diarrhea, nausea and vomiting.  Endocrine: Negative for polydipsia and polyuria.  Genitourinary: Negative for dysuria and hematuria.  Musculoskeletal: Positive for arthralgias and back pain. Negative for neck pain and neck stiffness.  Skin: Negative for rash.  Neurological: Negative for dizziness and weakness.  Psychiatric/Behavioral: Positive for dysphoric mood and sleep disturbance. Negative for agitation, behavioral problems and suicidal ideas. The patient is nervous/anxious.       Objective:    Physical Exam Vitals reviewed.  Constitutional:      General: She is not in acute distress.    Appearance: She is obese. She is not diaphoretic.  HENT:     Head: Normocephalic and atraumatic.     Nose: Nose normal.     Mouth/Throat:     Mouth: Mucous membranes are moist.  Eyes:     General: No scleral icterus.    Extraocular Movements: Extraocular movements intact.     Comments: Left eye hordeolum  Cardiovascular:     Rate and Rhythm: Normal rate and regular rhythm.     Pulses: Normal pulses.     Heart sounds: Normal heart sounds. No murmur heard.   Pulmonary:     Breath sounds: Wheezing (Mild, b/l) present. No rales.  Abdominal:     Palpations: Abdomen is soft.     Tenderness: There is no abdominal tenderness.  Musculoskeletal:     Cervical back: Neck supple. No tenderness.     Right lower leg: No edema.     Left lower leg: No edema.  Skin:    General: Skin is warm.     Findings: Rash (Eczematous rash over upper back) present.  Neurological:     General: No focal deficit present.     Mental Status: She is alert and oriented to person, place, and time.     Sensory: No sensory deficit.  Psychiatric:        Mood and Affect: Mood is depressed. Affect is tearful.        Behavior: Behavior is agitated.     BP (!) 161/94 (BP Location: Right Arm, Patient Position: Sitting, Cuff Size: Normal)   Pulse 85   Resp 18   Ht _0  (1.651 m)   Wt 290  lb 1.9 oz (131.6 kg)   LMP 08/06/2020   SpO2 98%   BMI 48.28 kg/m  Wt Readings from Last 3 Encounters:  08/22/20 290 lb 1.9 oz (131.6 kg)  08/15/20 290 lb (131.5 kg)  07/25/20 285 lb (129.3 kg)     Health Maintenance Due  Topic Date Due  . PNEUMOCOCCAL POLYSACCHARIDE VACCINE AGE 62-64 HIGH RISK  Never done  . FOOT EXAM  Never done  . OPHTHALMOLOGY EXAM  Never done  . TETANUS/TDAP  Never done  . HEMOGLOBIN A1C  07/20/2020    There are no preventive care reminders to display for this patient.  Lab Results  Component Value Date   TSH 1.050 02/16/2020  Lab Results  Component Value Date   WBC 10.3 05/10/2020   HGB 10.8 (L) 05/10/2020   HCT 36.4 05/10/2020   MCV 73.4 (L) 05/10/2020   PLT 220 05/10/2020   Lab Results  Component Value Date   NA 136 05/10/2020   K 3.5 05/10/2020   CO2 25 05/10/2020   GLUCOSE 91 05/10/2020   BUN 8 05/10/2020   CREATININE 0.50 06/19/2020   BILITOT 0.6 05/10/2020   ALKPHOS 62 05/10/2020   AST 15 05/10/2020   ALT 18 05/10/2020   PROT 6.7 05/10/2020   ALBUMIN 3.5 05/10/2020   CALCIUM 8.2 (L) 05/10/2020   ANIONGAP 8 05/10/2020   Lab Results  Component Value Date   CHOL 212 (H) 01/18/2020   Lab Results  Component Value Date   HDL 82 01/18/2020   Lab Results  Component Value Date   LDLCALC 111 (H) 01/18/2020   Lab Results  Component Value Date   TRIG 88 01/18/2020   Lab Results  Component Value Date   CHOLHDL 2.6 01/18/2020   Lab Results  Component Value Date   HGBA1C 6.9 (H) 01/18/2020      Assessment & Plan:   Problem List Items Addressed This Visit      Cardiovascular and Mediastinum   Essential hypertension    BP Readings from Last 1 Encounters:  08/22/20 (!) 161/94   Uncontrolled with Amlodipine and HCTZ, anxiety is likely a contributing factor. Takes Lasix for LE swelling Counseled for compliance with the medications Advised DASH diet and moderate exercise/walking, at least 150 mins/week      Relevant  Medications   hydrochlorothiazide (HYDRODIURIL) 25 MG tablet   amLODipine (NORVASC) 5 MG tablet     Respiratory   Asthma, moderate persistent    Continue Pulmicort and Albuterol (PRN) If persistent wheezing, will prescribe steroids Follows up with Pulmonology      Relevant Medications   budesonide (PULMICORT) 0.5 MG/2ML nebulizer solution   albuterol (PROAIR HFA) 108 (90 Base) MCG/ACT inhaler     Musculoskeletal and Integument   Rheumatoid arthritis (HCC) - Primary (Chronic)    On Plaquenil Flexeril and Ibuprofen PRN Rheumatology referral provided and sent documents to their office. Needs Ophthalmology evaluation due to use of Plaquenil      Relevant Orders   Ambulatory referral to Rheumatology     Other   Depression (Chronic)   Relevant Orders   Ambulatory referral to Psychiatry   Class 3 severe obesity with serious comorbidity and body mass index (BMI) of 45.0 to 49.9 in adult [facility])    Diet modification and moderate exercise as tolerated Referred to weight management clinic - but she did not prefer it. Considering her comorbidities and obesity, referred to Bariatric surgery.      Relevant Orders   Amb Referral to Bariatric Surgery   Swelling of lower extremity    Lasix PRN Leg elevation and compression stocking Weight loss would be beneficial as well.      Relevant Medications   hydrochlorothiazide (HYDRODIURIL) 25 MG tablet   Anxiety and depression    On multiple medications - but has not brought home medications for review Referred to Psychiatry for urgent evaluation as patient has severe depression and anxiety       Other Visit Diagnoses    Eczema, unspecified type       Relevant Medications   triamcinolone ointment (KENALOG) 0.5 %   triamcinolone (KENALOG) 0.025 % ointment   Hordeolum of left eye, unspecified eyelid, unspecified hordeolum type  Relevant Medications   tobramycin-dexamethasone (TOBRADEX) ophthalmic ointment      Meds ordered  this encounter  Medications  . budesonide (PULMICORT) 0.5 MG/2ML nebulizer solution    Sig: Take 2 mLs (0.5 mg total) by nebulization 2 (two) times daily.    Dispense:  120 mL    Refill:  12  . albuterol (PROAIR HFA) 108 (90 Base) MCG/ACT inhaler    Sig: 2 puffs every 4 hours as needed only  if your can't catch your breath    Dispense:  18 g    Refill:  11  . triamcinolone ointment (KENALOG) 0.5 %    Sig: Apply 1 application topically as needed.    Dispense:  30 g    Refill:  0  . tobramycin-dexamethasone (TOBRADEX) ophthalmic ointment    Sig: Place 1 application into the right eye 3 (three) times daily.    Dispense:  3.5 g    Refill:  0  . hydrochlorothiazide (HYDRODIURIL) 25 MG tablet    Sig: Take 1 tablet (25 mg total) by mouth daily. For high blood pressure.    Dispense:  90 tablet    Refill:  0  . triamcinolone (KENALOG) 0.025 % ointment    Sig: Apply 1 application topically 2 (two) times daily.    Dispense:  454 g    Refill:  0    Please cancel the prescription of 0.5% ointment.  Marland Kitchen amLODipine (NORVASC) 5 MG tablet    Sig: Take 1 tablet (5 mg total) by mouth daily.    Dispense:  90 tablet    Refill:  3    Follow-up: Return in about 4 weeks (around 09/19/2020) for Medication review.    [name], MD

## 2020-08-24 NOTE — Assessment & Plan Note (Addendum)
Continue Pulmicort and Albuterol (PRN) If persistent wheezing, will prescribe steroids Follows up with Pulmonology

## 2020-08-24 NOTE — Assessment & Plan Note (Signed)
Diet modification and moderate exercise as tolerated Referred to weight management clinic - but she did not prefer it. Considering her comorbidities and obesity, referred to Bariatric surgery.

## 2020-08-24 NOTE — Assessment & Plan Note (Signed)
BP Readings from Last 1 Encounters:  08/22/20 (!) 161/94   Uncontrolled with Amlodipine and HCTZ, anxiety is likely a contributing factor. Takes Lasix for LE swelling Counseled for compliance with the medications Advised DASH diet and moderate exercise/walking, at least 150 mins/week

## 2020-08-24 NOTE — Assessment & Plan Note (Signed)
On Plaquenil Flexeril and Ibuprofen PRN Rheumatology referral provided and sent documents to their office. Needs Ophthalmology evaluation due to use of Plaquenil

## 2020-08-27 ENCOUNTER — Ambulatory Visit (HOSPITAL_COMMUNITY): Payer: Medicaid Other | Admitting: Physical Therapy

## 2020-08-27 ENCOUNTER — Encounter (HOSPITAL_COMMUNITY): Payer: Medicaid Other

## 2020-08-28 ENCOUNTER — Telehealth (HOSPITAL_COMMUNITY): Payer: Self-pay | Admitting: Physical Therapy

## 2020-08-28 NOTE — Telephone Encounter (Signed)
Called patient about missed visit yesterday. Patient states she forgot about it. Informed patient that all current visits will be cancelled except next scheduled visit per attendance policy. Informed patient that she will need to attend this visit to scheduled further visits if wanting to continue therapy.   4:21 PM, 08/28/20 Georges Lynch PT DPT  Physical Therapist with [facility]  [phone]

## 2020-08-29 ENCOUNTER — Ambulatory Visit (HOSPITAL_COMMUNITY): Payer: Medicaid Other | Admitting: Physical Therapy

## 2020-08-29 ENCOUNTER — Ambulatory Visit: Payer: Medicaid Other | Admitting: Pulmonary Disease

## 2020-09-03 ENCOUNTER — Encounter (HOSPITAL_COMMUNITY): Payer: Self-pay | Admitting: Physical Therapy

## 2020-09-03 ENCOUNTER — Ambulatory Visit (HOSPITAL_COMMUNITY): Payer: Medicaid Other | Admitting: Physical Therapy

## 2020-09-03 NOTE — Therapy (Unsigned)
[place], [place], [address] Phone: [phone]   Fax:  [phone]  Patient Details  Name: Elvera Almario MRN: [id number] Date of Birth: 07-15-1975 Referring Provider:  No ref. provider found  Encounter Date: 09/03/2020   PHYSICAL THERAPY DISCHARGE SUMMARY  Visits from Start of Care:   Current functional level related to goals / functional outcomes: 3   Remaining deficits: Unknown as pt did not return for pt    Education / Equipment: HEP Plan: Patient agrees to discharge.  Patient goals were not met. Patient is being discharged due to not returning since the last visit.  ?????    Rayetta Humphrey, PT CLT [phone] Rayetta Humphrey, PT CLT [phone] 09/03/2020, 8:48 AM  [place], [place], [address] Phone: [phone]   Fax:  [phone]

## 2020-09-06 ENCOUNTER — Encounter (HOSPITAL_COMMUNITY): Payer: Medicaid Other | Admitting: Physical Therapy

## 2020-09-10 ENCOUNTER — Ambulatory Visit (HOSPITAL_COMMUNITY): Payer: Medicaid Other | Admitting: Physical Therapy

## 2020-09-10 ENCOUNTER — Ambulatory Visit: Payer: Medicaid Other | Admitting: Nurse Practitioner

## 2020-09-11 ENCOUNTER — Ambulatory Visit (INDEPENDENT_AMBULATORY_CARE_PROVIDER_SITE_OTHER): Payer: Medicaid Other | Admitting: Nurse Practitioner

## 2020-09-11 ENCOUNTER — Encounter: Payer: Self-pay | Admitting: Nurse Practitioner

## 2020-09-11 ENCOUNTER — Other Ambulatory Visit: Payer: Self-pay

## 2020-09-11 VITALS — BP 117/75 | HR 92 | Temp 98.3°F | Resp 22 | Ht 65.0 in | Wt 289.0 lb

## 2020-09-11 DIAGNOSIS — M25562 Pain in left knee: Secondary | ICD-10-CM

## 2020-09-11 DIAGNOSIS — J4540 Moderate persistent asthma, uncomplicated: Secondary | ICD-10-CM | POA: Diagnosis not present

## 2020-09-11 DIAGNOSIS — M5432 Sciatica, left side: Secondary | ICD-10-CM

## 2020-09-11 DIAGNOSIS — M5430 Sciatica, unspecified side: Secondary | ICD-10-CM | POA: Insufficient documentation

## 2020-09-11 MED ORDER — PREDNISONE 20 MG PO TABS: 40.0000 mg | ORAL_TABLET | Freq: Every day | ORAL | 0 refills | Status: AC

## 2020-09-11 MED ORDER — YUPELRI 175 MCG/3ML IN SOLN: 175.0000 ug | Freq: Every day | RESPIRATORY_TRACT | 0 refills | Status: DC

## 2020-09-11 MED ORDER — FORMOTEROL FUMARATE 20 MCG/2ML IN NEBU: 20.0000 ug | INHALATION_SOLUTION | Freq: Two times a day (BID) | RESPIRATORY_TRACT | 11 refills | Status: DC

## 2020-09-11 NOTE — Assessment & Plan Note (Signed)
-  Rx. Prednisone burst pack -refilled perforomist -Rx. yupelri -discussed using albuterol prior to using her maintenance meds while she is having exacerbation -O2 sat is 97% -if breathing gets worse, go to ED for eval

## 2020-09-11 NOTE — Progress Notes (Signed)
Acute Office Visit  Subjective:    Patient ID: Elizabeth Bautista, female    DOB: 12-19-1975, 45 y.o.   MRN: [id number]  Chief Complaint  Patient presents with  . Back Pain    Lower back pain worsening; stiffening x 2 weeks increasing.   . Seizures    Hx of convulsion disorder.   . Leg Pain    Pain radiates from back down to L leg and knee, at times L leg gets hot to touch and painful, and unable to move.     HPI Patient is in today for back pain that radiates down her leg.  Neurology note from 06/12/20 states, "Seizures-nonepileptic events  --We will increase gabapentin to 200 mg 3 times a day for discomfort that she experiences after seizure events. -- Discussed with Dr. Jaynee Eagles.  The patient has had a thorough work-up while in [place] and was diagnosed with pseudoseizures.  Her recent EEG in the hospital was also normal.  Not sure that we have more to offer. -- We will send back to PCP for management of gabapentin--PCP can consider possible referral to psychiatry for evaluation as well -- Follow-up on as-needed basis"  She was referred to psych and contacted them recently.  She has been wheezing profusely for the last few days.  Past Medical History:  Diagnosis Date  . Anemia   . Asthma   . COVID-19 virus infection 05/16/2020   04/27/20 dx covid-19, not hospitalized  . Diabetes ([place])    on Metformin  . Eczema   . Edema   . 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 HTN (hypertension)   . Rheumatoid arthritis ([place])    on Plaquenil   . Seizures ([place])    last seizure 2019  . Sleep apnea   . Sleep apnea     Past Surgical History:  Procedure Laterality Date  . APPENDECTOMY    . CESAREAN SECTION     x3    Family History  Problem Relation Age of Onset  . High blood pressure Mother   . Diabetes Father   . Diabetes Sister   . Heart Problems Sister   . Diabetes Brother   . Diabetes  Paternal Grandmother   . Diabetes Other        father's side "everybody died from Diabetes"  . High blood pressure Other        mother's side, multiple siblings with this   . Heart attack Other        family member on mother's side   . Diabetes Paternal Aunt   . Seizures Cousin        not sibings to the other cousins with seizures  . Breast cancer Cousin   . Seizures Cousin        not sibings to the other cousins with seizures  . Seizures Cousin        not sibings to the other cousins with seizures  . Cervical cancer Maternal Aunt   . Breast cancer Cousin   . Dementia Maternal Aunt   . Asthma Maternal Aunt   . Heart Problems Maternal Grandmother   . Asthma Child   . Asthma Child   . Asthma Daughter   . Angioedema Daughter   . Asthma Son     Social History   Socioeconomic History  . Marital status: Single    Spouse name: Not  on file  . Number of children: 5  . Years of education: Not on file  . Highest education level: 9th grade  Occupational History  . Not on file  Tobacco Use  . Smoking status: Former Smoker    Years: 10.00    Types: Cigarettes    Quit date: 06/02/2014    Years since quitting: 6.2  . Smokeless tobacco: Never Used  . Tobacco comment: during the 10 years of smoking, smoked 2-3 cigarettes/day  Vaping Use  . Vaping Use: Never used  Substance and Sexual Activity  . Alcohol use: Not Currently  . Drug use: Not Currently  . Sexual activity: Not Currently    Birth control/protection: Abstinence, Surgical    Comment: tubal  Other Topics Concern  . Not on file  Social History Narrative   Divorced.Lives with 3 kids.Originally from [place].Came from [place] 7 months ago.      02/27/2020   Right handed   Caffeine: none    Social Determinants of Health   Financial Resource Strain: High Risk  . Difficulty of Paying Living Expenses: Very hard  Food Insecurity: Food Insecurity Present  . Worried About Charity fundraiser in the Last Year: Often true   . Ran Out of Food in the Last Year: Often true  Transportation Needs: No Transportation Needs  . Lack of Transportation (Medical): No  . Lack of Transportation (Non-Medical): No  Physical Activity: Inactive  . Days of Exercise per Week: 0 days  . Minutes of Exercise per Session: 0 min  Stress: Stress Concern Present  . Feeling of Stress : Very much  Social Connections: Socially Isolated  . Frequency of Communication with Friends and Family: Once a week  . Frequency of Social Gatherings with Friends and Family: Never  . Attends Religious Services: Never  . Active Member of Clubs or Organizations: No  . Attends Archivist Meetings: Never  . Marital Status: Divorced  Human resources officer Violence: Not At Risk  . Fear of Current or Ex-Partner: No  . Emotionally Abused: No  . Physically Abused: No  . Sexually Abused: No    Outpatient Medications Prior to Visit  Medication Sig Dispense Refill  . albuterol (PROAIR HFA) 108 (90 Base) MCG/ACT inhaler 2 puffs every 4 hours as needed only  if your can't catch your breath 18 g 11  . albuterol (PROVENTIL) (2.5 MG/3ML) 0.083% nebulizer solution Take 3 mLs (2.5 mg total) by nebulization every 6 (six) hours as needed for wheezing or shortness of breath. 360 mL 5  . amLODipine (NORVASC) 5 MG tablet Take 1 tablet (5 mg total) by mouth daily. 90 tablet 3  . blood glucose meter kit and supplies Dispense based on patient and insurance preference. Use up to four times daily as directed. (FOR ICD-10 E10.9, E11.9). 1 each 0  . Blood Pressure Monitoring (SPHYGMOMANOMETER) MISC 1 each by Does not apply route daily. 1 each 0  . budesonide (PULMICORT) 0.5 MG/2ML nebulizer solution Take 2 mLs (0.5 mg total) by nebulization 2 (two) times daily. 120 mL 12  . busPIRone (BUSPAR) 7.5 MG tablet Take 7.5 mg by mouth 2 (two) times daily.    . cetirizine (ZYRTEC) 10 MG tablet Take 1 tablet (10 mg total) by mouth daily. 30 tablet 5  . Cholecalciferol 1.25 MG  (50000 UT) TABS Take 1 tablet by mouth daily. 90 tablet 0  . cyclobenzaprine (FLEXERIL) 10 MG tablet Take 10 mg by mouth 3 (three) times daily as needed  for muscle spasms.     Marland Kitchen escitalopram (LEXAPRO) 20 MG tablet Take 1 tablet by mouth every evening.    . famotidine (PEPCID) 20 MG tablet Take 20 mg by mouth daily.    . ferrous sulfate 325 (65 FE) MG tablet Take 325 mg by mouth 2 (two) times daily.    . fluticasone (CUTIVATE) 0.005 % ointment Apply 1 application topically daily.    . fluticasone (FLONASE) 50 MCG/ACT nasal spray Place 1 spray into both nostrils daily. 16 g 5  . furosemide (LASIX) 20 MG tablet Take 20 mg by mouth daily.    Marland Kitchen gabapentin (NEURONTIN) 100 MG capsule Take 2 capsules (200 mg total) by mouth 3 (three) times daily. 180 capsule 3  . hydrochlorothiazide (HYDRODIURIL) 25 MG tablet Take 1 tablet (25 mg total) by mouth daily. For high blood pressure. 90 tablet 0  . hydroxychloroquine (PLAQUENIL) 200 MG tablet Take 1 tablet (200 mg total) by mouth 2 (two) times daily. 60 tablet 1  . hydrOXYzine (ATARAX/VISTARIL) 25 MG tablet Take 25 mg by mouth 2 (two) times daily.    Marland Kitchen ibuprofen (ADVIL) 800 MG tablet Take 1 tablet (800 mg total) by mouth every 8 (eight) hours as needed. 30 tablet 0  . KLOR-CON M20 20 MEQ tablet TAKE 1 TABLET BY MOUTH DAILY ONLY WHEN YOU TAKE FUROSEMIDE. (Patient taking differently: 20 mEq daily. Only when you take Furosemide.) 30 tablet 3  . metoprolol tartrate (LOPRESSOR) 50 MG tablet TAKE 1 TABLET 2 HR PRIOR TO CARDIAC PROCEDURE 1 tablet 0  . mirtazapine (REMERON) 30 MG tablet Take 30 mg by mouth at bedtime.    . pantoprazole (PROTONIX) 40 MG tablet Take 1 tablet (40 mg total) by mouth daily. Take 30-60 min before first meal of the day 30 tablet 2  . prazosin (MINIPRESS) 1 MG capsule Take 1 mg by mouth at bedtime.    Marland Kitchen QUEtiapine (SEROQUEL) 25 MG tablet Take 25 mg by mouth 2 (two) times daily.     Marland Kitchen tobramycin-dexamethasone (TOBRADEX) ophthalmic ointment Place  1 application into the right eye 3 (three) times daily. 3.5 g 0  . triamcinolone (KENALOG) 0.025 % ointment Apply 1 application topically 2 (two) times daily. 454 g 0  . triamcinolone ointment (KENALOG) 0.5 % Apply 1 application topically as needed. 30 g 0  . fluconazole (DIFLUCAN) 150 MG tablet Take 1 now and 1 in 3 days 2 tablet 1  . formoterol (PERFOROMIST) 20 MCG/2ML nebulizer solution Take 2 mLs (20 mcg total) by nebulization 2 (two) times daily. 120 mL 11  . levETIRAcetam (KEPPRA) 500 MG tablet Take 1 tablet (500 mg total) by mouth 2 (two) times daily. 60 tablet 2  . montelukast (SINGULAIR) 10 MG tablet Take 1 tablet (10 mg total) by mouth at bedtime. 30 tablet 5   No facility-administered medications prior to visit.    Allergies  Allergen Reactions  . Phenytoin Anaphylaxis    Other reaction(s): swelling and heart rate decreased  . Phenylbutazones     Review of Systems  Constitutional: Negative.   Respiratory: Positive for chest tightness, shortness of breath and wheezing.   Cardiovascular: Negative.   Musculoskeletal: Positive for arthralgias and back pain.       Low back pain that radiates down her leg as well as left knee pain  Psychiatric/Behavioral: Positive for self-injury. Negative for suicidal ideas. The patient is nervous/anxious.        Objective:    Physical Exam Constitutional:  General: She is in acute distress.     Appearance: She is obese. She is ill-appearing.  Cardiovascular:     Rate and Rhythm: Normal rate and regular rhythm.     Pulses: Normal pulses.     Heart sounds: Normal heart sounds.  Pulmonary:     Effort: Respiratory distress present.     Breath sounds: Wheezing present.  Neurological:     Mental Status: She is alert.     Comments: Superficial tenderness when palpating her midback; endorsed pain to low back palpation as well  Psychiatric:     Comments: Anxious affect     BP 117/75   Pulse 92   Temp 98.3 F (36.8 C)   Resp (!)  22   Ht 5' 5"  (1.651 m)   Wt 289 lb (131.1 kg)   SpO2 97%   BMI 48.09 kg/m  Wt Readings from Last 3 Encounters:  09/11/20 289 lb (131.1 kg)  08/22/20 290 lb 1.9 oz (131.6 kg)  08/15/20 290 lb (131.5 kg)    Health Maintenance Due  Topic Date Due  . PNEUMOCOCCAL POLYSACCHARIDE VACCINE AGE 58-64 HIGH RISK  Never done  . FOOT EXAM  Never done  . OPHTHALMOLOGY EXAM  Never done  . TETANUS/TDAP  Never done  . HEMOGLOBIN A1C  07/20/2020    There are no preventive care reminders to display for this patient.   Lab Results  Component Value Date   TSH 1.050 02/16/2020   Lab Results  Component Value Date   WBC 10.3 05/10/2020   HGB 10.8 (L) 05/10/2020   HCT 36.4 05/10/2020   MCV 73.4 (L) 05/10/2020   PLT 220 05/10/2020   Lab Results  Component Value Date   NA 136 05/10/2020   K 3.5 05/10/2020   CO2 25 05/10/2020   GLUCOSE 91 05/10/2020   BUN 8 05/10/2020   CREATININE 0.50 06/19/2020   BILITOT 0.6 05/10/2020   ALKPHOS 62 05/10/2020   AST 15 05/10/2020   ALT 18 05/10/2020   PROT 6.7 05/10/2020   ALBUMIN 3.5 05/10/2020   CALCIUM 8.2 (L) 05/10/2020   ANIONGAP 8 05/10/2020   Lab Results  Component Value Date   CHOL 212 (H) 01/18/2020   Lab Results  Component Value Date   HDL 82 01/18/2020   Lab Results  Component Value Date   LDLCALC 111 (H) 01/18/2020   Lab Results  Component Value Date   TRIG 88 01/18/2020   Lab Results  Component Value Date   CHOLHDL 2.6 01/18/2020   Lab Results  Component Value Date   HGBA1C 6.9 (H) 01/18/2020       Assessment & Plan:   Problem List Items Addressed This Visit      Respiratory   Asthma, moderate persistent    -Rx. Prednisone burst pack -refilled perforomist -Rx. yupelri -discussed using albuterol prior to using her maintenance meds while she is having exacerbation -O2 sat is 97% -if breathing gets worse, go to ED for eval      Relevant Medications   predniSONE (DELTASONE) 20 MG tablet   formoterol  (PERFOROMIST) 20 MCG/2ML nebulizer solution   revefenacin (YUPELRI) 175 MCG/3ML nebulizer solution    Other Visit Diagnoses    Acute pain of left knee    -  Primary   Relevant Orders   Ambulatory referral to Orthopedic Surgery       Meds ordered this encounter  Medications  . predniSONE (DELTASONE) 20 MG tablet    Sig: Take 2  tablets (40 mg total) by mouth daily with breakfast for 5 days.    Dispense:  10 tablet    Refill:  0  . formoterol (PERFOROMIST) 20 MCG/2ML nebulizer solution    Sig: Take 2 mLs (20 mcg total) by nebulization 2 (two) times daily.    Dispense:  120 mL    Refill:  11  . revefenacin (YUPELRI) 175 MCG/3ML nebulizer solution    Sig: Take 3 mLs (175 mcg total) by nebulization daily.    Dispense:  90 mL    Refill:  0     Noreene Larsson, NP

## 2020-09-11 NOTE — Patient Instructions (Signed)
If breathing gets worse, go to the emergency department.

## 2020-09-11 NOTE — Assessment & Plan Note (Signed)
-  positive left straight leg raise -Rx. Prednisone -referral to ortho -has hx of conversion disorder and has psych referral pending; may need imaging to determine if structural vs psychiatric issue occurring today

## 2020-09-12 ENCOUNTER — Telehealth: Payer: Self-pay

## 2020-09-12 ENCOUNTER — Telehealth: Payer: Self-pay | Admitting: Pulmonary Disease

## 2020-09-12 ENCOUNTER — Telehealth: Payer: Self-pay | Admitting: *Deleted

## 2020-09-12 ENCOUNTER — Other Ambulatory Visit: Payer: Self-pay | Admitting: Pulmonary Disease

## 2020-09-12 DIAGNOSIS — Z9989 Dependence on other enabling machines and devices: Secondary | ICD-10-CM

## 2020-09-12 DIAGNOSIS — G4733 Obstructive sleep apnea (adult) (pediatric): Secondary | ICD-10-CM

## 2020-09-12 NOTE — Telephone Encounter (Signed)
I called and spoke with patient regarding wanting to switch DME company. Patient had reached out to [facility] sherry due to having to wait for CPAP with [facility]. Patient stated it has been 4 months and still low on [facility] waiting list. Patient called and spoke to [place] and they have machines so is wanting to switch. Informed patient if they do not have any. Patient will go back to the bottom of waiting list. Patient verbalized understand and I sent in order for new CPAP start with [facility]. Nothing further needed. Patient will call back with anymore questions/concerns.

## 2020-09-12 NOTE — Telephone Encounter (Signed)
I saw Medicaid/Medi... and thought she had dual coverage.

## 2020-09-12 NOTE — Telephone Encounter (Signed)
Ashly called from [employer] following up on Nebulizer meds. They can not bill part B for medicaid only, he has sent [facility].  [facility] must have some type of medicare before they can bill. He will call and inform the [facility] [place].

## 2020-09-12 NOTE — Telephone Encounter (Signed)
Called pt and explained wait list for cpap's.  She states that is what [employer] told her is that she is on their wait list.  Order was placed in January.  If we send to a different dme she will have to start all over.  Pt does live in [place].  I told her [facility] may have machines.  She is going to call and talk to them.  If she decides she wants to go with them she is going to call me but otherwise she wants to stay with [facility] on wait list.  Nothing further needed at this time.

## 2020-09-12 NOTE — Telephone Encounter (Signed)
L/M for patient to reach out to office 4/19 pm or later to schedule appt to start [place] due to deliver 4/19

## 2020-09-13 ENCOUNTER — Telehealth: Payer: Self-pay

## 2020-09-13 NOTE — Telephone Encounter (Signed)
Patient called she states the pharmacy is trying to reach [place] about a prior authorization on nebulizer medication ph# [phone]

## 2020-09-18 ENCOUNTER — Other Ambulatory Visit: Payer: Self-pay

## 2020-09-18 ENCOUNTER — Telehealth: Payer: Self-pay

## 2020-09-18 MED ORDER — ALBUTEROL SULFATE (2.5 MG/3ML) 0.083% IN NEBU: 2.5000 mg | INHALATION_SOLUTION | Freq: Four times a day (QID) | RESPIRATORY_TRACT | 5 refills | Status: DC | PRN

## 2020-09-18 NOTE — Telephone Encounter (Signed)
Does not look like this medication was even sent in yet. Will send to pharmacy and will check to see if it needs a PA.

## 2020-09-18 NOTE — Telephone Encounter (Signed)
Patient said her asthma [facility].  It was sent to [facility] and then should have been changed per patient.

## 2020-09-18 NOTE — Telephone Encounter (Signed)
Pt is calling she is still in pain, and has not heard anything from [facility] referral --please call

## 2020-09-18 NOTE — Telephone Encounter (Signed)
Gave referral to [facility]

## 2020-09-18 NOTE — Telephone Encounter (Signed)
[facility]

## 2020-09-19 ENCOUNTER — Other Ambulatory Visit: Payer: Self-pay

## 2020-09-19 ENCOUNTER — Encounter: Payer: Self-pay | Admitting: Internal Medicine

## 2020-09-19 ENCOUNTER — Telehealth (INDEPENDENT_AMBULATORY_CARE_PROVIDER_SITE_OTHER): Payer: Medicaid Other | Admitting: Internal Medicine

## 2020-09-19 VITALS — BP 142/96

## 2020-09-19 DIAGNOSIS — M25552 Pain in left hip: Secondary | ICD-10-CM

## 2020-09-19 DIAGNOSIS — M069 Rheumatoid arthritis, unspecified: Secondary | ICD-10-CM | POA: Diagnosis not present

## 2020-09-19 DIAGNOSIS — J4540 Moderate persistent asthma, uncomplicated: Secondary | ICD-10-CM

## 2020-09-19 MED ORDER — TRAMADOL HCL 50 MG PO TABS: 50.0000 mg | ORAL_TABLET | Freq: Three times a day (TID) | ORAL | 0 refills | Status: AC | PRN

## 2020-09-19 NOTE — Progress Notes (Signed)
Virtual Visit via Telephone Note   This visit type was conducted due to national recommendations for restrictions regarding the COVID-19 Pandemic (e.g. social distancing) in an effort to limit this patient's exposure and mitigate transmission in our community.  Due to her co-morbid illnesses, this patient is at least at moderate risk for complications without adequate follow up.  This format is felt to be most appropriate for this patient at this time.  The patient did not have access to video technology/had technical difficulties with video requiring transitioning to audio format only (telephone).  All issues noted in this document were discussed and addressed.  No physical exam could be performed with this format.  Evaluation Performed:  Follow-up visit  Date:  09/19/2020   ID:  Elizabeth Bautista, DOB September 17, 1975, MRN [id number]  Patient Location: Home Provider Location: Office/Clinic  Location of Patient: Home Location of Provider: Telehealth Consent was obtain for visit to be over via telehealth. I verified that I am speaking with the correct person using two identifiers.  PCP:  [name], MD   Chief Complaint:  Hip pain  History of Present Illness:    Elizabeth Bautista is a 45 y.o. female with PMH of HTN, OSA on CPAP, asthma, GERD, RA, depression with anxiety, chronic LE swelling, seizure disorder, chronic low back pain and morbid obesity who has a televisit for c/o acute left hip pain that has been constant for last 1 week. She denies any recent injury or fall. She was given Prednisone in the last week for acute hip/leg pain, which did not help. She denies any fever, chills.  She was prescribed Perforomist and Yupelri in the last visit by Donneta Romberg, but it was not covered by her insurance. She has an appointment with Dr Halford Chessman in the next week. She is also going to start [place] soon.  The patient does not have symptoms concerning for COVID-19 infection (fever, chills, cough, or new  shortness of breath).   Past Medical, Surgical, Social History, Allergies, and Medications have been Reviewed.  Past Medical History:  Diagnosis Date  . Anemia   . Asthma   . COVID-19 virus infection 05/16/2020   04/27/20 dx covid-19, not hospitalized  . Diabetes ([place])    on Metformin  . Eczema   . Edema   . 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 HTN (hypertension)   . Rheumatoid arthritis ([place])    on Plaquenil   . Seizures ([place])    last seizure 2019  . Sleep apnea   . Sleep apnea    Past Surgical History:  Procedure Laterality Date  . APPENDECTOMY    . CESAREAN SECTION     x3     Current Meds  Medication Sig  . albuterol (PROAIR HFA) 108 (90 Base) MCG/ACT inhaler 2 puffs every 4 hours as needed only  if your can't catch your breath  . albuterol (PROVENTIL) (2.5 MG/3ML) 0.083% nebulizer solution Take 3 mLs (2.5 mg total) by nebulization every 6 (six) hours as needed for wheezing or shortness of breath.  Marland Kitchen amLODipine (NORVASC) 5 MG tablet Take 1 tablet (5 mg total) by mouth daily.  . blood glucose meter kit and supplies Dispense based on patient and insurance preference. Use up to four times daily as directed. (FOR ICD-10 E10.9, E11.9).  Marland Kitchen Blood Pressure Monitoring (SPHYGMOMANOMETER) MISC 1 each by Does not apply route  daily.  . budesonide (PULMICORT) 0.5 MG/2ML nebulizer solution Take 2 mLs (0.5 mg total) by nebulization 2 (two) times daily.  . busPIRone (BUSPAR) 7.5 MG tablet Take 7.5 mg by mouth 2 (two) times daily.  . cetirizine (ZYRTEC) 10 MG tablet Take 1 tablet (10 mg total) by mouth daily.  . Cholecalciferol 1.25 MG (50000 UT) TABS Take 1 tablet by mouth daily.  . cyclobenzaprine (FLEXERIL) 10 MG tablet Take 10 mg by mouth 3 (three) times daily as needed for muscle spasms.   Marland Kitchen escitalopram (LEXAPRO) 20 MG tablet Take 1 tablet by mouth every evening.  . famotidine (PEPCID) 20 MG  tablet Take 20 mg by mouth daily.  . ferrous sulfate 325 (65 FE) MG tablet Take 325 mg by mouth 2 (two) times daily.  . fluticasone (CUTIVATE) 0.005 % ointment Apply 1 application topically daily.  . fluticasone (FLONASE) 50 MCG/ACT nasal spray Place 1 spray into both nostrils daily.  . furosemide (LASIX) 20 MG tablet Take 20 mg by mouth daily.  Marland Kitchen gabapentin (NEURONTIN) 100 MG capsule Take 2 capsules (200 mg total) by mouth 3 (three) times daily.  . hydrochlorothiazide (HYDRODIURIL) 25 MG tablet Take 1 tablet (25 mg total) by mouth daily. For high blood pressure.  . hydroxychloroquine (PLAQUENIL) 200 MG tablet Take 1 tablet (200 mg total) by mouth 2 (two) times daily.  . hydrOXYzine (ATARAX/VISTARIL) 25 MG tablet Take 25 mg by mouth 2 (two) times daily.  Marland Kitchen ibuprofen (ADVIL) 800 MG tablet Take 1 tablet (800 mg total) by mouth every 8 (eight) hours as needed.  Marland Kitchen KLOR-CON M20 20 MEQ tablet TAKE 1 TABLET BY MOUTH DAILY ONLY WHEN YOU TAKE FUROSEMIDE. (Patient taking differently: 20 mEq daily. Only when you take Furosemide.)  . metoprolol tartrate (LOPRESSOR) 50 MG tablet TAKE 1 TABLET 2 HR PRIOR TO CARDIAC PROCEDURE  . mirtazapine (REMERON) 30 MG tablet Take 30 mg by mouth at bedtime.  . pantoprazole (PROTONIX) 40 MG tablet Take 1 tablet (40 mg total) by mouth daily. Take 30-60 min before first meal of the day  . prazosin (MINIPRESS) 1 MG capsule Take 1 mg by mouth at bedtime.  Marland Kitchen QUEtiapine (SEROQUEL) 25 MG tablet Take 25 mg by mouth 2 (two) times daily.   Marland Kitchen tobramycin-dexamethasone (TOBRADEX) ophthalmic ointment Place 1 application into the right eye 3 (three) times daily.  . traMADol (ULTRAM) 50 MG tablet Take 1 tablet (50 mg total) by mouth every 8 (eight) hours as needed for up to 7 days for moderate pain or severe pain.  Marland Kitchen triamcinolone (KENALOG) 0.025 % ointment Apply 1 application topically 2 (two) times daily.  Marland Kitchen triamcinolone ointment (KENALOG) 0.5 % Apply 1 application topically as needed.   . [DISCONTINUED] formoterol (PERFOROMIST) 20 MCG/2ML nebulizer solution Take 2 mLs (20 mcg total) by nebulization 2 (two) times daily.  . [DISCONTINUED] revefenacin (YUPELRI) 175 MCG/3ML nebulizer solution Take 3 mLs (175 mcg total) by nebulization daily.     Allergies:   Phenytoin and Phenylbutazones   ROS:   Please see the history of present illness.     All other systems reviewed and are negative.   Labs/Other Tests and Data Reviewed:    Recent Labs: 02/16/2020: TSH 1.050 03/13/2020: Magnesium 2.1 05/10/2020: ALT 18; BUN 8; Hemoglobin 10.8; Platelets 220; Potassium 3.5; Sodium 136 06/19/2020: Creatinine, Ser 0.50   Recent Lipid Panel Lab Results  Component Value Date/Time   CHOL 212 (H) 01/18/2020 03:00 PM   TRIG 88 01/18/2020 03:00 PM   HDL  82 01/18/2020 03:00 PM   CHOLHDL 2.6 01/18/2020 03:00 PM   LDLCALC 111 (H) 01/18/2020 03:00 PM    Wt Readings from Last 3 Encounters:  09/11/20 289 lb (131.1 kg)  08/22/20 290 lb 1.9 oz (131.6 kg)  08/15/20 290 lb (131.5 kg)      ASSESSMENT & PLAN:    Rheumatoid arthritis (HCC) On Plaquenil Flexeril and Ibuprofen PRN Rheumatology referral provided Needs Ophthalmology evaluation due to use of Plaquenil  Asthma, moderate persistent Continue Pulmicort and Albuterol (PRN) Going to start [place] up with Pulmonology  Acute hip pain, left Persistent, recently had oral Prednisone States she is not able to get X-ray today, she has an appointment with Orthopedic surgery tomorrow. Needs imaging - preferably CT or MRI considering history of RA Tramadol PRN prescribed for now    Time:   Today, I have spent 16 minutes reviewing the chart, including problem list, medications, and with the patient with telehealth technology discussing the above problems.   Medication Adjustments/Labs and Tests Ordered: Current medicines are reviewed at length with the patient today.  Concerns regarding medicines are outlined above.    Tests Ordered: No orders of the defined types were placed in this encounter.   Medication Changes: Meds ordered this encounter  Medications  . traMADol (ULTRAM) 50 MG tablet    Sig: Take 1 tablet (50 mg total) by mouth every 8 (eight) hours as needed for up to 7 days for moderate pain or severe pain.    Dispense:  20 tablet    Refill:  0     Note: This dictation was prepared with Dragon dictation along with smaller phrase technology. Similar sounding words can be transcribed inadequately or may not be corrected upon review. Any transcriptional errors that result from this process are unintentional.      Disposition:  Follow up  Signed, [name], MD  09/19/2020 10:08 AM     [place]

## 2020-09-19 NOTE — Patient Instructions (Signed)
Please start taking Tramadol as prescribed for moderate/severe pain.  Please follow up with Orthopedic surgeon for evaluation of hip pain.  Please follow up with Pulmonology and Allergy/Immunology for asthma.

## 2020-09-19 NOTE — Assessment & Plan Note (Signed)
Persistent, recently had oral Prednisone States she is not able to get X-ray today, she has an appointment with Orthopedic surgery tomorrow. Needs imaging - preferably CT or MRI considering history of RA Tramadol PRN prescribed for now

## 2020-09-19 NOTE — Assessment & Plan Note (Signed)
On Plaquenil Flexeril and Ibuprofen PRN Rheumatology referral provided Needs Ophthalmology evaluation due to use of Plaquenil

## 2020-09-19 NOTE — Assessment & Plan Note (Signed)
Continue Pulmicort and Albuterol (PRN) Going to start Nucala Follows up with Pulmonology

## 2020-09-20 ENCOUNTER — Ambulatory Visit: Payer: Medicaid Other

## 2020-09-20 ENCOUNTER — Encounter: Payer: Self-pay | Admitting: Orthopaedic Surgery

## 2020-09-20 ENCOUNTER — Other Ambulatory Visit: Payer: Self-pay

## 2020-09-20 ENCOUNTER — Ambulatory Visit (INDEPENDENT_AMBULATORY_CARE_PROVIDER_SITE_OTHER): Payer: Medicaid Other | Admitting: Orthopaedic Surgery

## 2020-09-20 VITALS — BP 162/94 | HR 82 | Ht 65.0 in | Wt 295.1 lb

## 2020-09-20 DIAGNOSIS — M5442 Lumbago with sciatica, left side: Secondary | ICD-10-CM

## 2020-09-20 DIAGNOSIS — Z6841 Body Mass Index (BMI) 40.0 and over, adult: Secondary | ICD-10-CM | POA: Diagnosis not present

## 2020-09-20 DIAGNOSIS — G8929 Other chronic pain: Secondary | ICD-10-CM

## 2020-09-20 DIAGNOSIS — M25562 Pain in left knee: Secondary | ICD-10-CM

## 2020-09-20 NOTE — Progress Notes (Signed)
Subjective:    Patient ID: Elizabeth Bautista, female    DOB: [birth year], 45 y.o.   MRN: [id number]  HPI She has long complicated history. She had seizures in 2019 and was hospitalized over a month.  She developed pain and weakness on the left side of her body.  She had been in therapy often until COVID hit.  She has just recently resumed therapy.  She has pain of the left side of her back which radiates to the left buttocks, left knee and below the knee on the left.  She has no new trauma. She uses a walker. The knee is tender at times and has some swelling but no giving way, no redness.  She has been seen yesterday at [facility]. I have reviewed the notes.  She has been on prednisone for her chronic asthma and new increased dose was given yesterday as well as Toradol.  She is on multiple medications including ibuprofen and Neurontin.  She has no bowel or bladder problems.   Review of Systems  Constitutional: Positive for activity change.  Respiratory: Positive for shortness of breath and wheezing.   Musculoskeletal: Positive for arthralgias, back pain, gait problem, joint swelling and myalgias.  Allergic/Immunologic: Positive for environmental allergies.  Psychiatric/Behavioral: The patient is nervous/anxious.   All other systems reviewed and are negative.  For Review of Systems, all other systems reviewed and are negative.  The following is a summary of the past history medically, past history surgically, known current medicines, social history and family history.  This information is gathered electronically by the computer from prior information and documentation.  I review this each visit and have found including this information at this point in the chart is beneficial and informative.   Past Medical History:  Diagnosis Date  . Anemia   . Asthma   . COVID-19 virus infection 05/16/2020   04/27/20 dx covid-19, not hospitalized  . Diabetes ([place])    on Metformin  .  Eczema   . Edema   . 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 HTN (hypertension)   . Rheumatoid arthritis ([place])    on Plaquenil   . Seizures ([place])    last seizure 2019  . Sleep apnea   . Sleep apnea     Past Surgical History:  Procedure Laterality Date  . APPENDECTOMY    . CESAREAN SECTION     x3    Current Outpatient Medications on File Prior to Visit  Medication Sig Dispense Refill  . albuterol (PROAIR HFA) 108 (90 Base) MCG/ACT inhaler 2 puffs every 4 hours as needed only  if your can't catch your breath 18 g 11  . albuterol (PROVENTIL) (2.5 MG/3ML) 0.083% nebulizer solution Take 3 mLs (2.5 mg total) by nebulization every 6 (six) hours as needed for wheezing or shortness of breath. 360 mL 5  . amLODipine (NORVASC) 5 MG tablet Take 1 tablet (5 mg total) by mouth daily. 90 tablet 3  . blood glucose meter kit and supplies Dispense based on patient and insurance preference. Use up to four times daily as directed. (FOR ICD-10 E10.9, E11.9). 1 each 0  . Blood Pressure Monitoring (SPHYGMOMANOMETER) MISC 1 each by Does not apply route daily. 1 each 0  . budesonide (PULMICORT) 0.5 MG/2ML nebulizer solution Take 2 mLs (0.5 mg total) by nebulization 2 (two) times daily. 120 mL 12  . busPIRone (  BUSPAR) 7.5 MG tablet Take 7.5 mg by mouth 2 (two) times daily.    . cetirizine (ZYRTEC) 10 MG tablet Take 1 tablet (10 mg total) by mouth daily. 30 tablet 5  . Cholecalciferol 1.25 MG (50000 UT) TABS Take 1 tablet by mouth daily. 90 tablet 0  . cyclobenzaprine (FLEXERIL) 10 MG tablet Take 10 mg by mouth 3 (three) times daily as needed for muscle spasms.     Marland Kitchen escitalopram (LEXAPRO) 20 MG tablet Take 1 tablet by mouth every evening.    . famotidine (PEPCID) 20 MG tablet Take 20 mg by mouth daily.    . ferrous sulfate 325 (65 FE) MG tablet Take 325 mg by mouth 2 (two) times daily.    . fluticasone (CUTIVATE) 0.005 %  ointment Apply 1 application topically daily.    . fluticasone (FLONASE) 50 MCG/ACT nasal spray Place 1 spray into both nostrils daily. 16 g 5  . furosemide (LASIX) 20 MG tablet Take 20 mg by mouth daily.    Marland Kitchen gabapentin (NEURONTIN) 100 MG capsule Take 2 capsules (200 mg total) by mouth 3 (three) times daily. 180 capsule 3  . hydrochlorothiazide (HYDRODIURIL) 25 MG tablet Take 1 tablet (25 mg total) by mouth daily. For high blood pressure. 90 tablet 0  . hydroxychloroquine (PLAQUENIL) 200 MG tablet Take 1 tablet (200 mg total) by mouth 2 (two) times daily. 60 tablet 1  . hydrOXYzine (ATARAX/VISTARIL) 25 MG tablet Take 25 mg by mouth 2 (two) times daily.    Marland Kitchen ibuprofen (ADVIL) 800 MG tablet Take 1 tablet (800 mg total) by mouth every 8 (eight) hours as needed. 30 tablet 0  . KLOR-CON M20 20 MEQ tablet TAKE 1 TABLET BY MOUTH DAILY ONLY WHEN YOU TAKE FUROSEMIDE. (Patient taking differently: 20 mEq daily. Only when you take Furosemide.) 30 tablet 3  . metoprolol tartrate (LOPRESSOR) 50 MG tablet TAKE 1 TABLET 2 HR PRIOR TO CARDIAC PROCEDURE 1 tablet 0  . mirtazapine (REMERON) 30 MG tablet Take 30 mg by mouth at bedtime.    . pantoprazole (PROTONIX) 40 MG tablet Take 1 tablet (40 mg total) by mouth daily. Take 30-60 min before first meal of the day 30 tablet 2  . prazosin (MINIPRESS) 1 MG capsule Take 1 mg by mouth at bedtime.    Marland Kitchen QUEtiapine (SEROQUEL) 25 MG tablet Take 25 mg by mouth 2 (two) times daily.     Marland Kitchen tobramycin-dexamethasone (TOBRADEX) ophthalmic ointment Place 1 application into the right eye 3 (three) times daily. 3.5 g 0  . traMADol (ULTRAM) 50 MG tablet Take 1 tablet (50 mg total) by mouth every 8 (eight) hours as needed for up to 7 days for moderate pain or severe pain. 20 tablet 0  . triamcinolone (KENALOG) 0.025 % ointment Apply 1 application topically 2 (two) times daily. 454 g 0  . triamcinolone ointment (KENALOG) 0.5 % Apply 1 application topically as needed. 30 g 0  .  levETIRAcetam (KEPPRA) 500 MG tablet Take 1 tablet (500 mg total) by mouth 2 (two) times daily. 60 tablet 2  . montelukast (SINGULAIR) 10 MG tablet Take 1 tablet (10 mg total) by mouth at bedtime. 30 tablet 5   No current facility-administered medications on file prior to visit.    Social History   Socioeconomic History  . Marital status: Single    Spouse name: Not on file  . Number of children: 5  . Years of education: Not on file  . Highest education level: 9th grade  Occupational History  . Not on file  Tobacco Use  . Smoking status: Former Smoker    Years: 10.00    Types: Cigarettes    Quit date: 06/02/2014    Years since quitting: 6.3  . Smokeless tobacco: Never Used  . Tobacco comment: during the 10 years of smoking, smoked 2-3 cigarettes/day  Vaping Use  . Vaping Use: Never used  Substance and Sexual Activity  . Alcohol use: Not Currently  . Drug use: Not Currently  . Sexual activity: Not Currently    Birth control/protection: Abstinence, Surgical    Comment: tubal  Other Topics Concern  . Not on file  Social History Narrative   Divorced.Lives with 3 kids.Originally from [place].Came from [place] 7 months ago.      02/27/2020   Right handed   Caffeine: none    Social Determinants of Health   Financial Resource Strain: High Risk  . Difficulty of Paying Living Expenses: Very hard  Food Insecurity: Food Insecurity Present  . Worried About Charity fundraiser in the Last Year: Often true  . Ran Out of Food in the Last Year: Often true  Transportation Needs: No Transportation Needs  . Lack of Transportation (Medical): No  . Lack of Transportation (Non-Medical): No  Physical Activity: Inactive  . Days of Exercise per Week: 0 days  . Minutes of Exercise per Session: 0 min  Stress: Stress Concern Present  . Feeling of Stress : Very much  Social Connections: Socially Isolated  . Frequency of Communication with Friends and Family: Once a week  . Frequency of  Social Gatherings with Friends and Family: Never  . Attends Religious Services: Never  . Active Member of Clubs or Organizations: No  . Attends Archivist Meetings: Never  . Marital Status: Divorced  Human resources officer Violence: Not At Risk  . Fear of Current or Ex-Partner: No  . Emotionally Abused: No  . Physically Abused: No  . Sexually Abused: No    Family History  Problem Relation Age of Onset  . High blood pressure Mother   . Diabetes Father   . Diabetes Sister   . Heart Problems Sister   . Diabetes Brother   . Diabetes Paternal Grandmother   . Diabetes Other        father's side "everybody died from Diabetes"  . High blood pressure Other        mother's side, multiple siblings with this   . Heart attack Other        family member on mother's side   . Diabetes Paternal Aunt   . Seizures Cousin        not sibings to the other cousins with seizures  . Breast cancer Cousin   . Seizures Cousin        not sibings to the other cousins with seizures  . Seizures Cousin        not sibings to the other cousins with seizures  . Cervical cancer Maternal Aunt   . Breast cancer Cousin   . Dementia Maternal Aunt   . Asthma Maternal Aunt   . Heart Problems Maternal Grandmother   . Asthma Child   . Asthma Child   . Asthma Daughter   . Angioedema Daughter   . Asthma Son     BP (!) 162/94   Pulse 82   Ht 5' 5"  (1.651 m)   Wt 295 lb 2 oz (133.9 kg)   BMI 49.11 kg/m   Body  mass index is 49.11 kg/m.      Objective:   Physical Exam Vitals and nursing note reviewed. Exam conducted with a chaperone present.  Constitutional:      Appearance: She is well-developed.  HENT:     Head: Normocephalic and atraumatic.  Eyes:     Conjunctiva/sclera: Conjunctivae normal.     Pupils: Pupils are equal, round, and reactive to light.  Cardiovascular:     Rate and Rhythm: Normal rate and regular rhythm.  Pulmonary:     Effort: Pulmonary effort is normal.  Abdominal:      Palpations: Abdomen is soft.  Musculoskeletal:       Arms:     Cervical back: Normal range of motion and neck supple.       Legs:  Skin:    General: Skin is warm and dry.  Neurological:     Mental Status: She is alert and oriented to person, place, and time.     Cranial Nerves: No cranial nerve deficit.     Motor: No abnormal muscle tone.     Coordination: Coordination normal.     Deep Tendon Reflexes: Reflexes are normal and symmetric. Reflexes normal.  Psychiatric:        Behavior: Behavior normal.        Thought Content: Thought content normal.        Judgment: Judgment normal.    X-rays of lumbar spine done, reported separately.       Assessment & Plan:   Encounter Diagnoses  Name Primary?  . Chronic left-sided low back pain with left-sided sciatica Yes  . Chronic pain of left knee   . Body mass index 45.0-49.9, adult ([place])   . Morbid obesity ([place])    I will get MRI of the lumbar spine.  She had one ordered in September but it was not done as she had other medical issues.  She has had documented problems with the lower back for some time.  Return after MRI of the spine.  She declines injection to the knee.  Continue same medicines.  I will not add to it.  Call if any problem.  Precautions discussed.   Electronically Signed Sanjuana Kava, MD 4/21/202210:34 AM

## 2020-09-21 ENCOUNTER — Other Ambulatory Visit: Payer: Self-pay | Admitting: *Deleted

## 2020-09-21 MED ORDER — EPINEPHRINE 0.3 MG/0.3ML IJ SOAJ: 0.3000 mg | Freq: Once | INTRAMUSCULAR | 1 refills | Status: AC

## 2020-09-25 ENCOUNTER — Telehealth: Payer: Medicaid Other

## 2020-09-26 ENCOUNTER — Ambulatory Visit (INDEPENDENT_AMBULATORY_CARE_PROVIDER_SITE_OTHER): Payer: Medicaid Other | Admitting: Pulmonary Disease

## 2020-09-26 ENCOUNTER — Telehealth (INDEPENDENT_AMBULATORY_CARE_PROVIDER_SITE_OTHER): Payer: Medicaid Other | Admitting: Licensed Clinical Social Worker

## 2020-09-26 ENCOUNTER — Telehealth: Payer: Self-pay | Admitting: Licensed Clinical Social Worker

## 2020-09-26 ENCOUNTER — Ambulatory Visit: Payer: Medicaid Other

## 2020-09-26 ENCOUNTER — Other Ambulatory Visit: Payer: Self-pay

## 2020-09-26 ENCOUNTER — Encounter: Payer: Self-pay | Admitting: Pulmonary Disease

## 2020-09-26 VITALS — BP 158/92 | HR 72 | Temp 97.0°F | Ht 65.0 in | Wt 292.6 lb

## 2020-09-26 DIAGNOSIS — R058 Other specified cough: Secondary | ICD-10-CM

## 2020-09-26 DIAGNOSIS — J301 Allergic rhinitis due to pollen: Secondary | ICD-10-CM

## 2020-09-26 DIAGNOSIS — F32A Depression, unspecified: Secondary | ICD-10-CM

## 2020-09-26 DIAGNOSIS — F411 Generalized anxiety disorder: Secondary | ICD-10-CM

## 2020-09-26 DIAGNOSIS — Z9989 Dependence on other enabling machines and devices: Secondary | ICD-10-CM

## 2020-09-26 DIAGNOSIS — J4551 Severe persistent asthma with (acute) exacerbation: Secondary | ICD-10-CM

## 2020-09-26 DIAGNOSIS — G4733 Obstructive sleep apnea (adult) (pediatric): Secondary | ICD-10-CM | POA: Diagnosis not present

## 2020-09-26 MED ORDER — EPINEPHRINE 0.3 MG/0.3ML IJ SOAJ: 0.3000 mg | INTRAMUSCULAR | 1 refills | Status: AC | PRN

## 2020-09-26 NOTE — Progress Notes (Signed)
[facility]  Chief Complaint  Patient presents with  . Follow-up    Patient states "I'm still wheezing a lot"    Constitutional:  BP (!) 158/92 (BP Location: Left Arm, Cuff Size: Normal)   Pulse 72   Temp (!) 97 F (36.1 C) (Other (Comment)) Comment (Src): wrist  Ht _0  (1.651 m)   Wt 292 lb 9.6 oz (132.7 kg)   SpO2 99% Comment: Room air  BMI 48.69 kg/m   Past Medical History:  Asthma, Anemia, Seizures, DM type 2, HTN, GERD, RA, COVID 19 infection November 2021  Past Surgical History:  Her  has a past surgical history that includes Appendectomy and Cesarean section.  Brief Summary:  Elizabeth Bautista is a 45 y.o. female former smoker with asthma, upper airway cough, laryngopharyngeal reflux, and obstructive sleep apnea.       Subjective:   She was just on another course of prednisone.  Wheezing improved but not completely resolved.  Has intermittent cough.  No sputum or fever.  Using budesonide bid and albuterol 3 to 4 times per day.  Hasn't received CPAP yet.  She is to start nucala this afternoon.  Hasn't received script for epi pen yet.  She is waking up at night feeling choked and has acid reflux.  Physical Exam:   Appearance - well kempt   ENMT - no sinus tenderness, no oral exudate, no LAN, Mallampati 4 airway, no stridor, enlarged tongue  Respiratory - equal breath sounds bilaterally, faint bilateral wheezing  CV - s1s2 regular rate and rhythm, no murmurs  Ext - no clubbing, no edema  Skin - no rashes  Psych - normal mood and affect    Pulmonary testing:   Spirometry 08/18/17 >> FEV1 2.03 (82%), FEV1% 73  CBC 05/10/20 >> eosinophils 0.4 K/uL  Allergy test 05/30/20 >> rye/bermuda/johnson grass, mold, hickory, dog, cat, cockroach  A1AT 07/09/20 >> 147  RAST 07/09/20 >> dust mites, cats, dogs, cockroach, pecan hickory; IgE 884  Chest Imaging:   CT sinus 10/20/19 >> normal  CT angio chest 05/10/20 >> airway  thickening, faint patchy GGO in lunges  Sleep Tests:   PSG 05/23/20 >> AHI 6.7, SpO2 low 83%.  REM AHI 32.4.  Cardiac Tests:   Echo 01/24/20 >> EF 55 to 60%, grade 1 DD, mod LA dilation  Social History:  She  reports that she quit smoking about 6 years ago. Her smoking use included cigarettes. She quit after 10.00 years of use. She has never used smokeless tobacco. She reports previous alcohol use. She reports previous drug use.  Family History:  Her family history includes Angioedema in her daughter; Asthma in her child, child, daughter, maternal aunt, and son; Breast cancer in her cousin and cousin; Cervical cancer in her maternal aunt; Dementia in her maternal aunt; Diabetes in her brother, father, paternal aunt, paternal grandmother, sister, and another family member; Heart Problems in her maternal grandmother and sister; Heart attack in an other family member; High blood pressure in her mother and another family member; Seizures in her cousin, cousin, and cousin.     Assessment/Plan:   Severe, persistent allergic asthma. - breztri wasn't effective - agree with plan to start nucala through Dr. Gillermina Hu office - she hasn't received epi pen yet; script sent to her pharmacy - continue budesonide bid; advised her to rinse her mouth after each use - prn albuterol - continue singulair  Obstructive sleep apnea. - order placed through [facility] for CPAP set up - discussed how sleep apnea can contribute to reflux symptoms - advised her to sleep with her head elevated until she is able to get started on CPAP  Upper airway cough syndrome. - has perennial allergic rhinitis - followed by Dr. Ernst Bowler - continue montelukast, cetirizine, fluticasone nasal spray  Rheumatoid arthritis. - she has been on plaquenil - rheumatology referral placed by her PCP  Obesity. - discussed importance of weight loss  Time Spent Involved in Patient Care on Day of Examination:  36  minutes  Follow up:  Patient Instructions  Script for Epi Pen sent to your pharmacy  Follow up in 4 months   Medication List:   Allergies as of 09/26/2020      Reactions   Phenytoin Anaphylaxis   Other reaction(s): swelling and heart rate decreased   Phenylbutazones       Medication List       Accurate as of September 26, 2020  9:53 AM. If you have any questions, ask your nurse or doctor.        albuterol 108 (90 Base) MCG/ACT inhaler Commonly known as: ProAir HFA 2 puffs every 4 hours as needed only  if your can't catch your breath   albuterol (2.5 MG/3ML) 0.083% nebulizer solution Commonly known as: PROVENTIL Take 3 mLs (2.5 mg total) by nebulization every 6 (six) hours as needed for wheezing or shortness of breath.   amLODipine 5 MG tablet Commonly known as: NORVASC Take 1 tablet (5 mg total) by mouth daily.   blood glucose meter kit and supplies Dispense based on patient and insurance preference. Use up to four times daily as directed. (FOR ICD-10 E10.9, E11.9).   budesonide 0.5 MG/2ML nebulizer solution Commonly known as: PULMICORT Take 2 mLs (0.5 mg total) by nebulization 2 (two) times daily.   busPIRone 7.5 MG tablet Commonly known as: BUSPAR Take 7.5 mg by mouth 2 (two) times daily.   cetirizine 10 MG tablet Commonly known as: ZYRTEC Take 1 tablet (10 mg total) by mouth daily.   Cholecalciferol 1.25 MG (50000 UT) Tabs Take 1 tablet by mouth daily.   cyclobenzaprine 10 MG tablet Commonly known as: FLEXERIL Take 10 mg by mouth 3 (three) times daily as needed for muscle spasms.   EPINEPHrine 0.3 mg/0.3 mL Soaj injection Commonly known as: EPI-PEN Inject 0.3 mg into the muscle as needed for anaphylaxis. Started by: Chesley Mires, MD   escitalopram 20 MG tablet Commonly known as: LEXAPRO Take 1 tablet by mouth every evening.   famotidine 20 MG tablet Commonly known as: PEPCID Take 20 mg by mouth daily.   ferrous sulfate 325 (65 FE) MG tablet Take  325 mg by mouth 2 (two) times daily.   fluticasone 0.005 % ointment Commonly known as: CUTIVATE Apply 1 application topically daily.   fluticasone 50 MCG/ACT nasal spray Commonly known as: FLONASE Place 1 spray into both nostrils daily.   furosemide 20 MG tablet Commonly known as: LASIX Take 20 mg by mouth daily.   gabapentin 100 MG capsule Commonly known as: Neurontin Take 2 capsules (200 mg total) by mouth 3 (three) times daily.   hydrochlorothiazide 25 MG tablet Commonly known as: HYDRODIURIL Take 1 tablet (25 mg total) by mouth daily. For high blood pressure.   hydroxychloroquine 200 MG tablet Commonly known as: PLAQUENIL Take 1 tablet (200 mg total) by mouth 2 (two) times daily.   hydrOXYzine 25 MG tablet Commonly known as: ATARAX/VISTARIL Take 25 mg by mouth  2 (two) times daily.   ibuprofen 800 MG tablet Commonly known as: ADVIL Take 1 tablet (800 mg total) by mouth every 8 (eight) hours as needed.   Klor-Con M20 20 MEQ tablet Generic drug: potassium chloride SA TAKE 1 TABLET BY MOUTH DAILY ONLY WHEN YOU TAKE FUROSEMIDE. What changed: See the new instructions.   levETIRAcetam 500 MG tablet Commonly known as: Keppra Take 1 tablet (500 mg total) by mouth 2 (two) times daily.   metoprolol tartrate 50 MG tablet Commonly known as: LOPRESSOR TAKE 1 TABLET 2 HR PRIOR TO CARDIAC PROCEDURE   mirtazapine 30 MG tablet Commonly known as: REMERON Take 30 mg by mouth at bedtime.   montelukast 10 MG tablet Commonly known as: SINGULAIR Take 1 tablet (10 mg total) by mouth at bedtime.   pantoprazole 40 MG tablet Commonly known as: Protonix Take 1 tablet (40 mg total) by mouth daily. Take 30-60 min before first meal of the day   prazosin 1 MG capsule Commonly known as: MINIPRESS Take 1 mg by mouth at bedtime.   QUEtiapine 25 MG tablet Commonly known as: SEROQUEL Take 25 mg by mouth 2 (two) times daily.   Sphygmomanometer Misc 1 each by Does not apply route  daily.   tobramycin-dexamethasone ophthalmic ointment Commonly known as: TOBRADEX Place 1 application into the right eye 3 (three) times daily.   traMADol 50 MG tablet Commonly known as: ULTRAM Take 1 tablet (50 mg total) by mouth every 8 (eight) hours as needed for up to 7 days for moderate pain or severe pain.   triamcinolone ointment 0.5 % Commonly known as: KENALOG Apply 1 application topically as needed.   triamcinolone 0.025 % ointment Commonly known as: KENALOG Apply 1 application topically 2 (two) times daily.       Signature:  Chesley Mires, MD [place] Pager - [phone] 09/26/2020, 9:53 AM

## 2020-09-26 NOTE — BH Specialist Note (Signed)
[place] Initial Clinical Assessment  MRN: [id number] NAME: Elizabeth Bautista Date: 09/25/2020  Start time:  9a End time:  930a Total time:  60mn Call number:  video visit  Type of Contact:  video Patient consent obtained:  yes Reason for Visit today:  initiate   Treatment History Patient recently received Inpatient Treatment:  no  Facility/Program:    Date of discharge:   Patient currently being seen by therapist/psychiatrist:   Patient currently receiving the following services:    Past Psychiatric History/Hospitalization(s): Anxiety: No Bipolar Disorder: No Depression: No Mania: No Psychosis: No Schizophrenia: No Personality Disorder: No Hospitalization for psychiatric illness: No History of Electroconvulsive Shock Therapy: No Prior Suicide Attempts: No  Clinical Assessment:  PHQ-9 Assessments: [facility] 09/26/2020 09/19/2020 09/11/2020  Decreased Interest 2 0 2  Down, Depressed, Hopeless 2 0 2  PHQ - 2 Score 4 0 4  Altered sleeping 2 - 3  Tired, decreased energy 2 - 3  Change in appetite 2 - 3  Feeling bad or failure about yourself  2 - 3  Trouble concentrating 2 - 3  Moving slowly or fidgety/restless 1 - 3  Suicidal thoughts 0 - 0  PHQ-9 Score 15 - 22  Difficult doing work/chores Somewhat difficult Not difficult at all Very difficult    GAD-7 Assessments: GAD 7 : Generalized Anxiety Score 09/26/2020 06/04/2020  Nervous, Anxious, on Edge 2 3  Control/stop worrying 3 3  Worry too much - different things 3 3  Trouble relaxing 2 3  Restless 2 3  Easily annoyed or irritable 3 3  Afraid - awful might happen 3 3  Total GAD 7 Score 18 21  Anxiety Difficulty Very difficult -     Social Functioning Social maturity:  WNL Social judgement:  WNL  Stress Current stressors:  DX Familial stressors:  denies Sleep:  about 2 hours per night Appetite:  overeats Coping ability:  exhausted Patient taking medications as prescribed:   yes  Current medications:  Outpatient Encounter Medications as of 09/26/2020  Medication Sig  . albuterol (PROAIR HFA) 108 (90 Base) MCG/ACT inhaler 2 puffs every 4 hours as needed only  if your can't catch your breath  . albuterol (PROVENTIL) (2.5 MG/3ML) 0.083% nebulizer solution Take 3 mLs (2.5 mg total) by nebulization every 6 (six) hours as needed for wheezing or shortness of breath.  .Marland KitchenamLODipine (NORVASC) 5 MG tablet Take 1 tablet (5 mg total) by mouth daily.  . blood glucose meter kit and supplies Dispense based on patient and insurance preference. Use up to four times daily as directed. (FOR ICD-10 E10.9, E11.9).  .Marland KitchenBlood Pressure Monitoring (SPHYGMOMANOMETER) MISC 1 each by Does not apply route daily.  . budesonide (PULMICORT) 0.5 MG/2ML nebulizer solution Take 2 mLs (0.5 mg total) by nebulization 2 (two) times daily.  . busPIRone (BUSPAR) 7.5 MG tablet Take 7.5 mg by mouth 2 (two) times daily.  . cetirizine (ZYRTEC) 10 MG tablet Take 1 tablet (10 mg total) by mouth daily.  . Cholecalciferol 1.25 MG (50000 UT) TABS Take 1 tablet by mouth daily.  . cyclobenzaprine (FLEXERIL) 10 MG tablet Take 10 mg by mouth 3 (three) times daily as needed for muscle spasms.   .Marland Kitchenescitalopram (LEXAPRO) 20 MG tablet Take 1 tablet by mouth every evening.  . famotidine (PEPCID) 20 MG tablet Take 20 mg by mouth daily.  . ferrous sulfate 325 (65 FE) MG tablet Take 325 mg by mouth 2 (two) times daily.  .Marland Kitchen  fluticasone (CUTIVATE) 0.005 % ointment Apply 1 application topically daily.  . fluticasone (FLONASE) 50 MCG/ACT nasal spray Place 1 spray into both nostrils daily.  . furosemide (LASIX) 20 MG tablet Take 20 mg by mouth daily.  Marland Kitchen gabapentin (NEURONTIN) 100 MG capsule Take 2 capsules (200 mg total) by mouth 3 (three) times daily.  . hydrochlorothiazide (HYDRODIURIL) 25 MG tablet Take 1 tablet (25 mg total) by mouth daily. For high blood pressure.  . hydroxychloroquine (PLAQUENIL) 200 MG tablet Take 1 tablet (200  mg total) by mouth 2 (two) times daily.  . hydrOXYzine (ATARAX/VISTARIL) 25 MG tablet Take 25 mg by mouth 2 (two) times daily.  Marland Kitchen ibuprofen (ADVIL) 800 MG tablet Take 1 tablet (800 mg total) by mouth every 8 (eight) hours as needed.  Marland Kitchen KLOR-CON M20 20 MEQ tablet TAKE 1 TABLET BY MOUTH DAILY ONLY WHEN YOU TAKE FUROSEMIDE. (Patient taking differently: 20 mEq daily. Only when you take Furosemide.)  . levETIRAcetam (KEPPRA) 500 MG tablet Take 1 tablet (500 mg total) by mouth 2 (two) times daily.  . metoprolol tartrate (LOPRESSOR) 50 MG tablet TAKE 1 TABLET 2 HR PRIOR TO CARDIAC PROCEDURE  . mirtazapine (REMERON) 30 MG tablet Take 30 mg by mouth at bedtime.  . montelukast (SINGULAIR) 10 MG tablet Take 1 tablet (10 mg total) by mouth at bedtime.  . pantoprazole (PROTONIX) 40 MG tablet Take 1 tablet (40 mg total) by mouth daily. Take 30-60 min before first meal of the day  . prazosin (MINIPRESS) 1 MG capsule Take 1 mg by mouth at bedtime.  Marland Kitchen QUEtiapine (SEROQUEL) 25 MG tablet Take 25 mg by mouth 2 (two) times daily.   Marland Kitchen tobramycin-dexamethasone (TOBRADEX) ophthalmic ointment Place 1 application into the right eye 3 (three) times daily.  . traMADol (ULTRAM) 50 MG tablet Take 1 tablet (50 mg total) by mouth every 8 (eight) hours as needed for up to 7 days for moderate pain or severe pain.  Marland Kitchen triamcinolone (KENALOG) 0.025 % ointment Apply 1 application topically 2 (two) times daily.  Marland Kitchen triamcinolone ointment (KENALOG) 0.5 % Apply 1 application topically as needed.   No facility-administered encounter medications on file as of 09/26/2020.    Self-harm Behaviors Risk Assessment Self-harm risk factors:   Patient endorses recent thoughts of harming self:    [place] Suicide Severity Rating Scale:  C-SRSS 10-Sep-2020  1. Wish to be Dead No  2. Suicidal Thoughts No  6. Suicide Behavior Question No    Danger to Others Risk Assessment Danger to others risk factors:   Patient endorses recent thoughts of  harming others:    Dynamic Appraisal of Situational Aggression (DASA): No flowsheet data found.  Substance Use Assessment Patient recently consumed alcohol:    Alcohol Use Disorder Identification Test (AUDIT):  Alcohol Use Disorder Test (AUDIT) 06/04/2020  1. How often do you have a drink containing alcohol? 1  2. How many drinks containing alcohol do you have on a typical day when you are drinking? 0  3. How often do you have six or more drinks on one occasion? 0  AUDIT-C Score 1   Patient recently used drugs:    Opioid Risk Assessment:  Patient is concerned about dependence or abuse of substances:    ASAM Multidimensional Assessment Summary:  Dimension 1:    Dimension 1 Rating:    Dimension 2:    Dimension 2 Rating:    Dimension 3:    Dimension 3 Rating:    Dimension 4:  Dimension 4 Rating:    Dimension 5:    Dimension 5 Rating:    Dimension 6:    Dimension 6 Rating:   ASAM's Severity Rating Score:   ASAM Recommended Level of Treatment:     Goals, Interventions and Follow-up Plan Goals: Increase healthy adjustment to current life circumstances Interventions: Solution-Focused Strategies and Mindfulness or Relaxation Training Follow-up Plan: Weekly VBH services  Summary of Clinical Assessment Summary: Elizabeth Bautista is a 45 yr old woman who is single with 5 children.  She reports that she wants assistance with her rare mental health disorder.  She was diagnosed with Conversion Disorder, MDD and Anxiety. She denies that her medication is helping.  She has been hospitalized several times due to seizures but was misdiagnosed for years. She currently reports sadness, crying spell, worry, irritability, fear and being on edge.Poor sleep, about 2 hours per night, takes medication for sleep.  In the past, she was hospitalized for 6 weeks due to seizures.  She was treated for seizure disorder for about 4-5 weeks then 1 week of mental health treatment. She is open to medication and  mindfulness/relaxation training.  She moved from [place] and began the disability process.    Lubertha South, LCSW

## 2020-09-26 NOTE — BH Specialist Note (Signed)
[facility] Treatment Plan Team Note  MRN: [id number] NAME: Elizabeth Bautista  DATE: 10/02/20  Start time:  4p End time:  410p Total time:  10 min  Total number of Virtual [place] Treatment Team Plan encounters: 1/4  Treatment Team Attendees: Royal Piedra, LCSW, Dr. Modesta Messing, Psychiatrist  Diagnoses:    ICD-10-CM   1. Depression, unspecified depression type  F32.A   2. GAD (generalized anxiety disorder)  F41.1     Goals, Interventions and Follow-up Plan Goals: Increase healthy adjustment to current life circumstances Interventions: Solution-Focused Strategies Mindfulness or Relaxation Training Medication Management Recommendations: will continue to evaluate; continue current medication regimen    Follow-up Plan: Weekly VBH services  History of the present illness Presenting Problem/Current Symptoms: Continues to have depressive and anxiety symptoms  Psychiatric History  Depression: Yes Anxiety: Yes Mania: No Psychosis: No PTSD symptoms: No  Past Psychiatric History/Hospitalization(s): Hospitalization for psychiatric illness: No Prior Suicide Attempts: No Prior Self-injurious behavior: No  Psychosocial stressors seizures   Self-harm Behaviors Risk Assessment denies   Screenings PHQ-9 Assessments:  Depression screen [facility] 2/9 09/26/2020 09/19/2020 09/11/2020  Decreased Interest 2 0 2  Down, Depressed, Hopeless 2 0 2  PHQ - 2 Score 4 0 4  Altered sleeping 2 - 3  Tired, decreased energy 2 - 3  Change in appetite 2 - 3  Feeling bad or failure about yourself  2 - 3  Trouble concentrating 2 - 3  Moving slowly or fidgety/restless 1 - 3  Suicidal thoughts 0 - 0  PHQ-9 Score 15 - 22  Difficult doing work/chores Somewhat difficult Not difficult at all Very difficult   GAD-7 Assessments:  GAD 7 : Generalized Anxiety Score 09/26/2020 06/04/2020  Nervous, Anxious, on Edge 2 3  Control/stop worrying 3 3  Worry too much - different things 3 3  Trouble relaxing 2 3   Restless 2 3  Easily annoyed or irritable 3 3  Afraid - awful might happen 3 3  Total GAD 7 Score 18 21  Anxiety Difficulty Very difficult -    Past Medical History Past Medical History:  Diagnosis Date  . Anemia   . Asthma   . COVID-19 virus infection 05/16/2020   04/27/20 dx covid-19, not hospitalized  . Diabetes ([place])    on Metformin  . Eczema   . Edema   . 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 HTN (hypertension)   . Rheumatoid arthritis ([place])    on Plaquenil   . Seizures ([place])    last seizure 2019  . Sleep apnea   . Sleep apnea     Vital signs: There were no vitals filed for this visit.  Allergies:  Allergies as of 09/26/2020 - Review Complete 09/26/2020  Allergen Reaction Noted  . Phenytoin Anaphylaxis 10/18/2019  . Phenylbutazones  10/18/2019    Medication History Current medications:  Outpatient Encounter Medications as of 09/26/2020  Medication Sig  . albuterol (PROAIR HFA) 108 (90 Base) MCG/ACT inhaler 2 puffs every 4 hours as needed only  if your can't catch your breath  . albuterol (PROVENTIL) (2.5 MG/3ML) 0.083% nebulizer solution Take 3 mLs (2.5 mg total) by nebulization every 6 (six) hours as needed for wheezing or shortness of breath.  Marland Kitchen amLODipine (NORVASC) 5 MG tablet Take 1 tablet (5 mg total) by mouth daily.  . blood glucose meter kit and supplies Dispense based on patient and  insurance preference. Use up to four times daily as directed. (FOR ICD-10 E10.9, E11.9).  Marland Kitchen Blood Pressure Monitoring (SPHYGMOMANOMETER) MISC 1 each by Does not apply route daily.  . budesonide (PULMICORT) 0.5 MG/2ML nebulizer solution Take 2 mLs (0.5 mg total) by nebulization 2 (two) times daily.  . busPIRone (BUSPAR) 7.5 MG tablet Take 7.5 mg by mouth 2 (two) times daily.  . cetirizine (ZYRTEC) 10 MG tablet Take 1 tablet (10 mg total) by mouth daily.  . Cholecalciferol 1.25 MG (50000  UT) TABS Take 1 tablet by mouth daily.  . cyclobenzaprine (FLEXERIL) 10 MG tablet Take 10 mg by mouth 3 (three) times daily as needed for muscle spasms.   Marland Kitchen EPINEPHrine 0.3 mg/0.3 mL IJ SOAJ injection Inject 0.3 mg into the muscle as needed for anaphylaxis.  Marland Kitchen escitalopram (LEXAPRO) 20 MG tablet Take 1 tablet by mouth every evening.  . famotidine (PEPCID) 20 MG tablet Take 20 mg by mouth daily.  . ferrous sulfate 325 (65 FE) MG tablet Take 325 mg by mouth 2 (two) times daily.  . fluticasone (CUTIVATE) 0.005 % ointment Apply 1 application topically daily.  . fluticasone (FLONASE) 50 MCG/ACT nasal spray Place 1 spray into both nostrils daily.  . furosemide (LASIX) 20 MG tablet Take 20 mg by mouth daily.  . hydrochlorothiazide (HYDRODIURIL) 25 MG tablet Take 1 tablet (25 mg total) by mouth daily. For high blood pressure.  . hydroxychloroquine (PLAQUENIL) 200 MG tablet Take 1 tablet (200 mg total) by mouth 2 (two) times daily.  . hydrOXYzine (ATARAX/VISTARIL) 25 MG tablet Take 25 mg by mouth 2 (two) times daily.  Marland Kitchen ibuprofen (ADVIL) 800 MG tablet Take 1 tablet (800 mg total) by mouth every 8 (eight) hours as needed.  . levETIRAcetam (KEPPRA) 500 MG tablet Take 1 tablet (500 mg total) by mouth 2 (two) times daily.  . mirtazapine (REMERON) 30 MG tablet Take 30 mg by mouth at bedtime.  . pantoprazole (PROTONIX) 40 MG tablet Take 1 tablet (40 mg total) by mouth daily. Take 30-60 min before first meal of the day  . QUEtiapine (SEROQUEL) 25 MG tablet Take 25 mg by mouth 2 (two) times daily.   . [EXPIRED] traMADol (ULTRAM) 50 MG tablet Take 1 tablet (50 mg total) by mouth every 8 (eight) hours as needed for up to 7 days for moderate pain or severe pain.  Marland Kitchen triamcinolone (KENALOG) 0.025 % ointment Apply 1 application topically 2 (two) times daily.  Marland Kitchen triamcinolone ointment (KENALOG) 0.5 % Apply 1 application topically as needed.  . [DISCONTINUED] gabapentin (NEURONTIN) 100 MG capsule Take 2 capsules (200 mg  total) by mouth 3 (three) times daily.  . [DISCONTINUED] KLOR-CON M20 20 MEQ tablet TAKE 1 TABLET BY MOUTH DAILY ONLY WHEN YOU TAKE FUROSEMIDE. (Patient taking differently: 20 mEq daily. Only when you take Furosemide.)  . [DISCONTINUED] metoprolol tartrate (LOPRESSOR) 50 MG tablet TAKE 1 TABLET 2 HR PRIOR TO CARDIAC PROCEDURE  . [DISCONTINUED] montelukast (SINGULAIR) 10 MG tablet Take 1 tablet (10 mg total) by mouth at bedtime.  . [DISCONTINUED] prazosin (MINIPRESS) 1 MG capsule Take 1 mg by mouth at bedtime.  . [DISCONTINUED] tobramycin-dexamethasone (TOBRADEX) ophthalmic ointment Place 1 application into the right eye 3 (three) times daily.   No facility-administered encounter medications on file as of 09/26/2020.     Scribe for Treatment Team: Lubertha South, LCSW

## 2020-09-26 NOTE — Patient Instructions (Signed)
Script for Epi Pen sent to your pharmacy  Follow up in 4 months

## 2020-09-27 ENCOUNTER — Inpatient Hospital Stay (HOSPITAL_COMMUNITY)
Admission: EM | Admit: 2020-09-27 | Discharge: 2020-10-01 | DRG: 202 | Disposition: A | Discharge status: Home / Self Care | Payer: Medicaid Other | Attending: Family Medicine | Admitting: Family Medicine

## 2020-09-27 ENCOUNTER — Other Ambulatory Visit: Payer: Self-pay

## 2020-09-27 ENCOUNTER — Emergency Department (HOSPITAL_COMMUNITY): Payer: Medicaid Other

## 2020-09-27 ENCOUNTER — Encounter (HOSPITAL_COMMUNITY): Payer: Self-pay | Admitting: Emergency Medicine

## 2020-09-27 DIAGNOSIS — Z6841 Body Mass Index (BMI) 40.0 and over, adult: Secondary | ICD-10-CM

## 2020-09-27 DIAGNOSIS — Z20822 Contact with and (suspected) exposure to covid-19: Secondary | ICD-10-CM | POA: Diagnosis present

## 2020-09-27 DIAGNOSIS — Z803 Family history of malignant neoplasm of breast: Secondary | ICD-10-CM

## 2020-09-27 DIAGNOSIS — K219 Gastro-esophageal reflux disease without esophagitis: Secondary | ICD-10-CM | POA: Diagnosis present

## 2020-09-27 DIAGNOSIS — F419 Anxiety disorder, unspecified: Secondary | ICD-10-CM | POA: Diagnosis present

## 2020-09-27 DIAGNOSIS — G40909 Epilepsy, unspecified, not intractable, without status epilepticus: Secondary | ICD-10-CM | POA: Diagnosis present

## 2020-09-27 DIAGNOSIS — G4733 Obstructive sleep apnea (adult) (pediatric): Secondary | ICD-10-CM | POA: Diagnosis present

## 2020-09-27 DIAGNOSIS — Z87898 Personal history of other specified conditions: Secondary | ICD-10-CM | POA: Diagnosis not present

## 2020-09-27 DIAGNOSIS — L309 Dermatitis, unspecified: Secondary | ICD-10-CM | POA: Diagnosis present

## 2020-09-27 DIAGNOSIS — I10 Essential (primary) hypertension: Secondary | ICD-10-CM | POA: Diagnosis present

## 2020-09-27 DIAGNOSIS — M069 Rheumatoid arthritis, unspecified: Secondary | ICD-10-CM | POA: Diagnosis present

## 2020-09-27 DIAGNOSIS — Z8049 Family history of malignant neoplasm of other genital organs: Secondary | ICD-10-CM | POA: Diagnosis not present

## 2020-09-27 DIAGNOSIS — Z8616 Personal history of COVID-19: Secondary | ICD-10-CM

## 2020-09-27 DIAGNOSIS — Z9989 Dependence on other enabling machines and devices: Secondary | ICD-10-CM

## 2020-09-27 DIAGNOSIS — J4551 Severe persistent asthma with (acute) exacerbation: Secondary | ICD-10-CM | POA: Diagnosis present

## 2020-09-27 DIAGNOSIS — Z7952 Long term (current) use of systemic steroids: Secondary | ICD-10-CM | POA: Diagnosis not present

## 2020-09-27 DIAGNOSIS — Z79899 Other long term (current) drug therapy: Secondary | ICD-10-CM

## 2020-09-27 DIAGNOSIS — E1165 Type 2 diabetes mellitus with hyperglycemia: Secondary | ICD-10-CM | POA: Diagnosis present

## 2020-09-27 DIAGNOSIS — F32A Depression, unspecified: Secondary | ICD-10-CM | POA: Diagnosis present

## 2020-09-27 DIAGNOSIS — J45901 Unspecified asthma with (acute) exacerbation: Secondary | ICD-10-CM | POA: Diagnosis not present

## 2020-09-27 DIAGNOSIS — Z87891 Personal history of nicotine dependence: Secondary | ICD-10-CM

## 2020-09-27 DIAGNOSIS — Z825 Family history of asthma and other chronic lower respiratory diseases: Secondary | ICD-10-CM

## 2020-09-27 DIAGNOSIS — Z833 Family history of diabetes mellitus: Secondary | ICD-10-CM | POA: Diagnosis not present

## 2020-09-27 DIAGNOSIS — Z888 Allergy status to other drugs, medicaments and biological substances status: Secondary | ICD-10-CM

## 2020-09-27 DIAGNOSIS — R0602 Shortness of breath: Secondary | ICD-10-CM | POA: Diagnosis present

## 2020-09-27 LAB — BASIC METABOLIC PANEL
Anion gap: 9 (ref 5–15)
BUN: 8 mg/dL (ref 6–20)
CO2: 24 mmol/L (ref 22–32)
Calcium: 9.4 mg/dL (ref 8.9–10.3)
Chloride: 99 mmol/L (ref 98–111)
Creatinine, Ser: 0.62 mg/dL (ref 0.44–1.00)
GFR, Estimated: >60 mL/min (ref >60)
Glucose, Bld: 110 mg/dL — ABNORMAL HIGH (ref 70–99)
Potassium: 4 mmol/L (ref 3.5–5.1)
Sodium: 132 mmol/L — ABNORMAL LOW (ref 135–145)

## 2020-09-27 LAB — CBC WITH DIFFERENTIAL/PLATELET
Abs Immature Granulocytes: 0.03 10*3/uL (ref 0.00–0.07)
Basophils Absolute: 0 10*3/uL (ref 0.0–0.1)
Basophils Relative: 0 %
Eosinophils Absolute: 0.4 10*3/uL (ref 0.0–0.5)
Eosinophils Relative: 5 %
HCT: 33.8 % — ABNORMAL LOW (ref 36.0–46.0)
Hemoglobin: 9.7 g/dL — ABNORMAL LOW (ref 12.0–15.0)
Immature Granulocytes: 0 %
Lymphocytes Relative: 21 %
Lymphs Abs: 1.7 10*3/uL (ref 0.7–4.0)
MCH: 21 pg — ABNORMAL LOW (ref 26.0–34.0)
MCHC: 28.7 g/dL — ABNORMAL LOW (ref 30.0–36.0)
MCV: 73.2 fL — ABNORMAL LOW (ref 80.0–100.0)
Monocytes Absolute: 0.4 10*3/uL (ref 0.1–1.0)
Monocytes Relative: 6 %
Neutro Abs: 5.3 10*3/uL (ref 1.7–7.7)
Neutrophils Relative %: 68 %
Platelets: 232 10*3/uL (ref 150–400)
RBC: 4.62 MIL/uL (ref 3.87–5.11)
RDW: 19.7 % — ABNORMAL HIGH (ref 11.5–15.5)
WBC: 7.8 10*3/uL (ref 4.0–10.5)
nRBC: 0 % (ref 0.0–0.2)

## 2020-09-27 LAB — GLUCOSE, CAPILLARY: Glucose-Capillary: 302 mg/dL — ABNORMAL HIGH (ref 70–99)

## 2020-09-27 LAB — HEMOGLOBIN A1C
Hgb A1c MFr Bld: 7 % — ABNORMAL HIGH (ref 4.8–5.6)
Mean Plasma Glucose: 154.2 mg/dL

## 2020-09-27 LAB — BRAIN NATRIURETIC PEPTIDE: B Natriuretic Peptide: 23 pg/mL (ref 0.0–100.0)

## 2020-09-27 LAB — RESP PANEL BY RT-PCR (FLU A&B, COVID) ARPGX2
Influenza A by PCR: NEGATIVE
Influenza B by PCR: NEGATIVE
SARS Coronavirus 2 by RT PCR: NEGATIVE

## 2020-09-27 LAB — POC SARS CORONAVIRUS 2 AG -  ED: SARSCOV2ONAVIRUS 2 AG: NEGATIVE

## 2020-09-27 IMAGING — DX DG CHEST 1V PORT
2 series · 2 of 2 positions shown · non-contrast
Comparison: Chest radiograph and chest CT [DATE]

CLINICAL DATA: Shortness of breath

EXAM:
PORTABLE CHEST 1 VIEW

[chest ap]
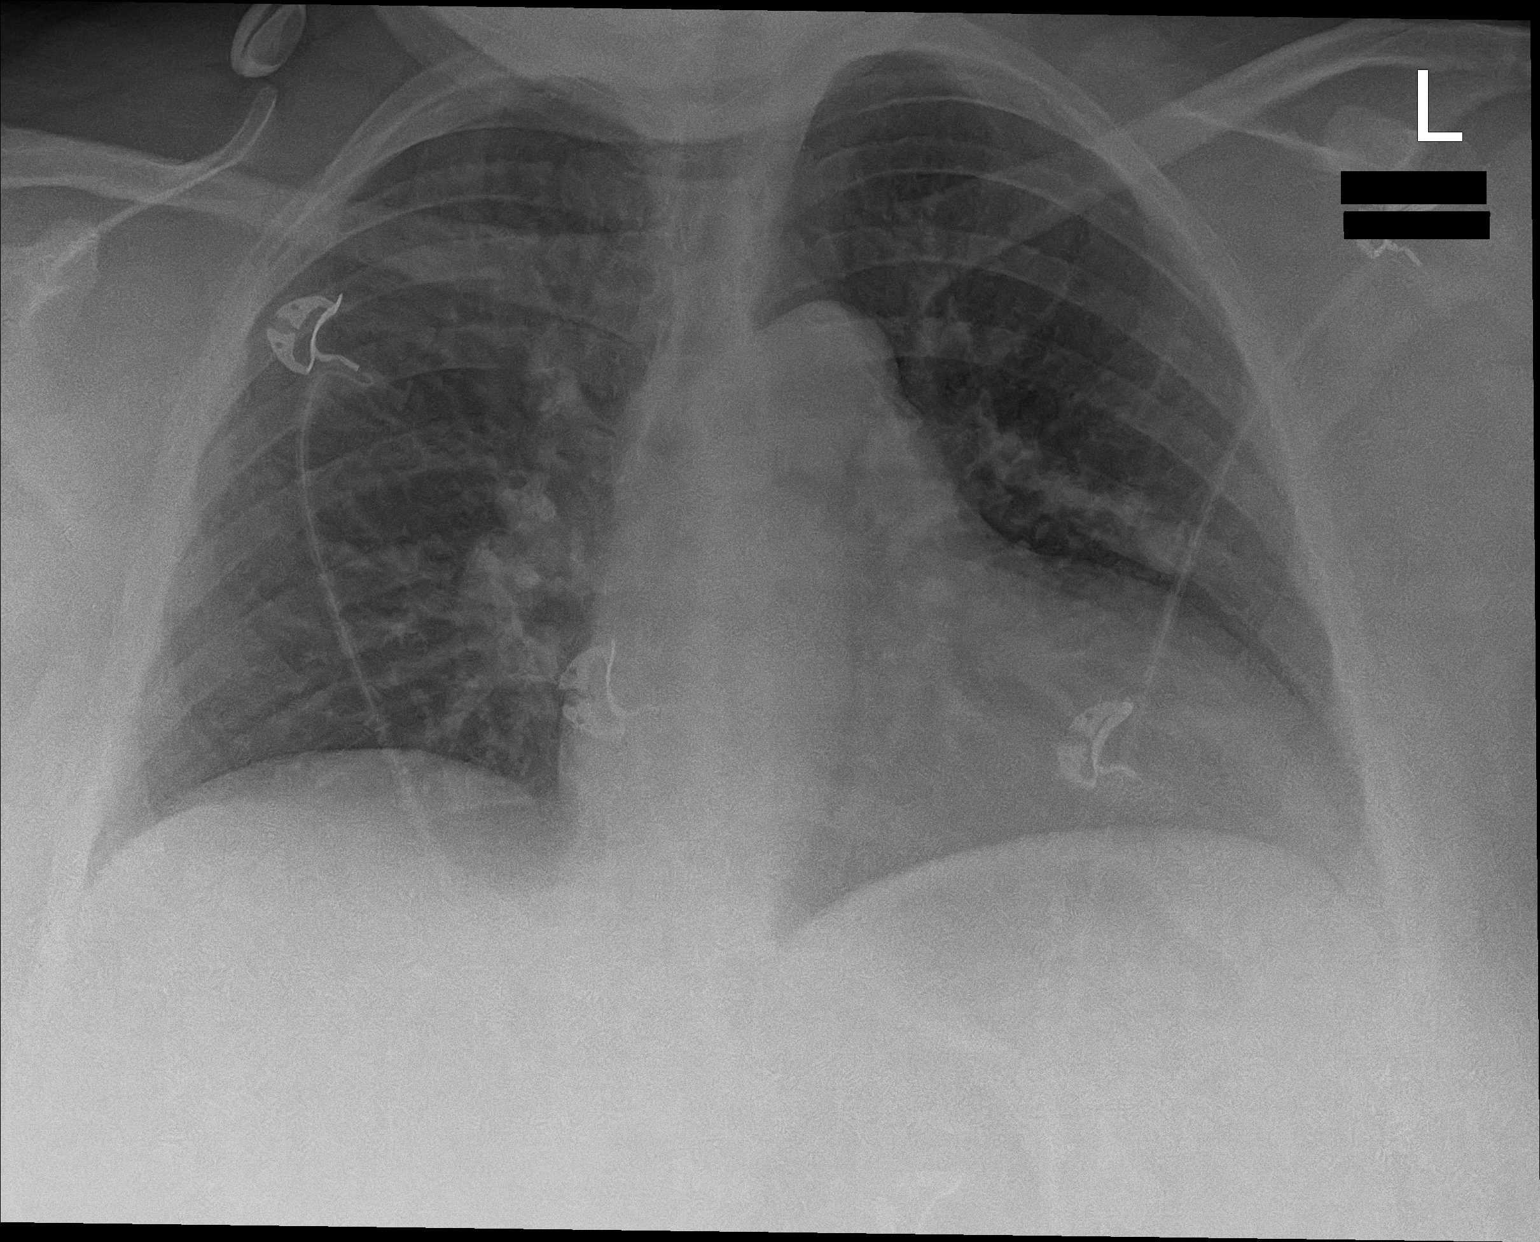

[chest ap grid]
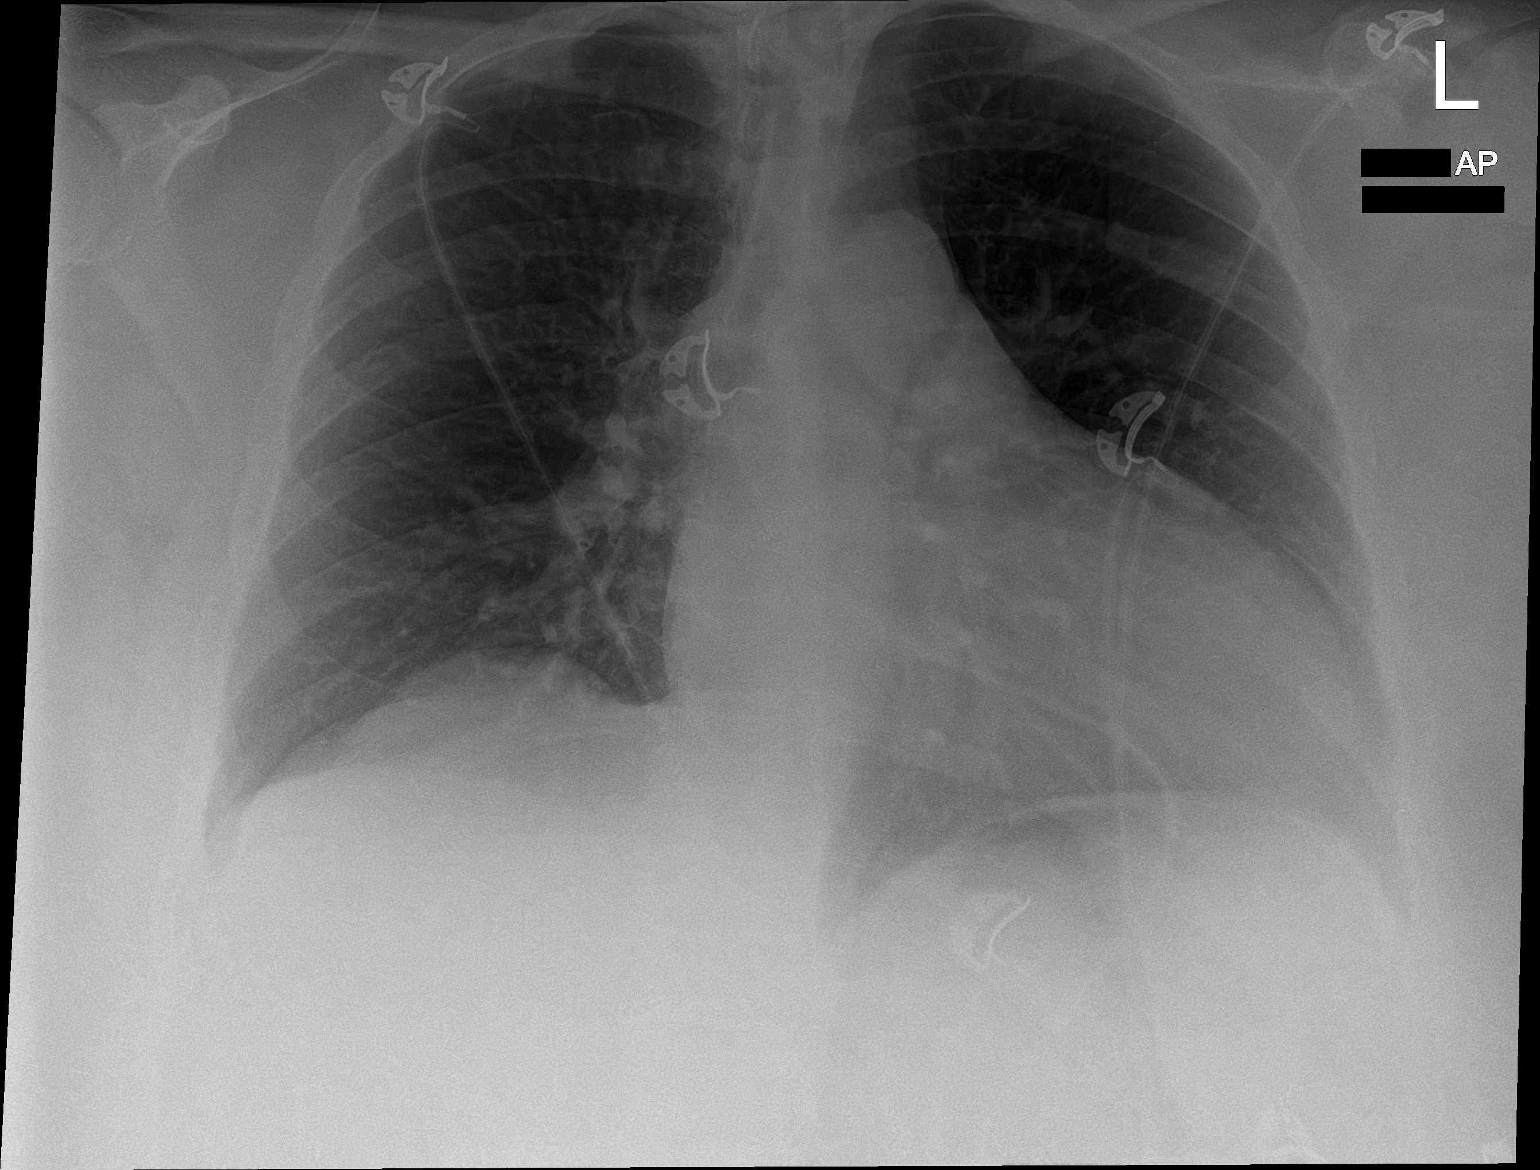

[2 of 2 positions shown; findings below may reference images not displayed]

FINDINGS: Lungs are clear. Heart is enlarged, stable, with pulmonary
vascularity normal. No adenopathy. No bone lesions.
IMPRESSION: Stable cardiomegaly.  No edema or airspace opacity.

## 2020-09-27 MED ORDER — ALBUTEROL (5 MG/ML) CONTINUOUS INHALATION SOLN
10.0000 mg/h | INHALATION_SOLUTION | Freq: Once | RESPIRATORY_TRACT | Status: AC
  Administered 2020-09-27: 10 mg/h via RESPIRATORY_TRACT
  Filled 2020-09-27: qty 20

## 2020-09-27 MED ORDER — VITAMIN D 25 MCG (1000 UNIT) PO TABS
1000.0000 [IU] | ORAL_TABLET | Freq: Every day | ORAL | Status: DC
  Administered 2020-09-27 – 2020-10-01 (×5): 1000 [IU] via ORAL
  Filled 2020-09-27 (×5): qty 1

## 2020-09-27 MED ORDER — BUDESONIDE 0.5 MG/2ML IN SUSP
0.5000 mg | Freq: Two times a day (BID) | RESPIRATORY_TRACT | Status: DC
  Administered 2020-09-27 – 2020-10-01 (×8): 0.5 mg via RESPIRATORY_TRACT
  Filled 2020-09-27 (×8): qty 2

## 2020-09-27 MED ORDER — HYDROCHLOROTHIAZIDE 25 MG PO TABS
25.0000 mg | ORAL_TABLET | Freq: Every day | ORAL | Status: DC
  Administered 2020-09-28 – 2020-10-01 (×4): 25 mg via ORAL
  Filled 2020-09-27 (×4): qty 1

## 2020-09-27 MED ORDER — BUDESONIDE 0.5 MG/2ML IN SUSP: 0.5000 mg | Freq: Two times a day (BID) | RESPIRATORY_TRACT | Status: DC

## 2020-09-27 MED ORDER — IPRATROPIUM-ALBUTEROL 0.5-2.5 (3) MG/3ML IN SOLN
3.0000 mL | Freq: Once | RESPIRATORY_TRACT | Status: AC
  Administered 2020-09-27: 3 mL via RESPIRATORY_TRACT
  Filled 2020-09-27: qty 3

## 2020-09-27 MED ORDER — HYDROXYCHLOROQUINE SULFATE 200 MG PO TABS
200.0000 mg | ORAL_TABLET | Freq: Two times a day (BID) | ORAL | Status: DC
  Administered 2020-09-28 – 2020-10-01 (×7): 200 mg via ORAL
  Filled 2020-09-27 (×15): qty 1

## 2020-09-27 MED ORDER — FLUTICASONE PROPIONATE 50 MCG/ACT NA SUSP
1.0000 | Freq: Every day | NASAL | Status: DC
  Filled 2020-09-27 (×2): qty 16

## 2020-09-27 MED ORDER — ALBUTEROL SULFATE HFA 108 (90 BASE) MCG/ACT IN AERS: 4.0000 | INHALATION_SPRAY | RESPIRATORY_TRACT | Status: DC | PRN

## 2020-09-27 MED ORDER — AMLODIPINE BESYLATE 5 MG PO TABS
5.0000 mg | ORAL_TABLET | Freq: Every day | ORAL | Status: DC
  Administered 2020-09-27 – 2020-10-01 (×5): 5 mg via ORAL
  Filled 2020-09-27 (×5): qty 1

## 2020-09-27 MED ORDER — METHYLPREDNISOLONE SODIUM SUCC 125 MG IJ SOLR
125.0000 mg | Freq: Once | INTRAMUSCULAR | Status: AC
  Administered 2020-09-27: 125 mg via INTRAVENOUS
  Filled 2020-09-27: qty 2

## 2020-09-27 MED ORDER — ESCITALOPRAM OXALATE 10 MG PO TABS
20.0000 mg | ORAL_TABLET | Freq: Every evening | ORAL | Status: DC
  Administered 2020-09-27 – 2020-09-30 (×4): 20 mg via ORAL
  Filled 2020-09-27 (×4): qty 2

## 2020-09-27 MED ORDER — METHYLPREDNISOLONE SODIUM SUCC 125 MG IJ SOLR
60.0000 mg | Freq: Four times a day (QID) | INTRAMUSCULAR | Status: DC
  Administered 2020-09-27 – 2020-10-01 (×15): 60 mg via INTRAVENOUS
  Filled 2020-09-27 (×15): qty 2

## 2020-09-27 MED ORDER — INSULIN ASPART 100 UNIT/ML IJ SOLN
0.0000 [IU] | Freq: Every day | INTRAMUSCULAR | Status: DC
  Administered 2020-09-27: 4 [IU] via SUBCUTANEOUS
  Administered 2020-09-28 – 2020-09-29 (×2): 3 [IU] via SUBCUTANEOUS
  Administered 2020-09-30: 5 [IU] via SUBCUTANEOUS

## 2020-09-27 MED ORDER — FUROSEMIDE 20 MG PO TABS
20.0000 mg | ORAL_TABLET | Freq: Every day | ORAL | Status: DC
  Administered 2020-09-27 – 2020-10-01 (×5): 20 mg via ORAL
  Filled 2020-09-27 (×5): qty 1

## 2020-09-27 MED ORDER — INSULIN ASPART 100 UNIT/ML IJ SOLN
0.0000 [IU] | Freq: Three times a day (TID) | INTRAMUSCULAR | Status: DC
  Administered 2020-09-28: 5 [IU] via SUBCUTANEOUS
  Administered 2020-09-28: 3 [IU] via SUBCUTANEOUS
  Administered 2020-09-28: 5 [IU] via SUBCUTANEOUS
  Administered 2020-09-29: 3 [IU] via SUBCUTANEOUS
  Administered 2020-09-29: 5 [IU] via SUBCUTANEOUS
  Administered 2020-09-29: 3 [IU] via SUBCUTANEOUS
  Administered 2020-09-30: 8 [IU] via SUBCUTANEOUS
  Administered 2020-09-30 – 2020-10-01 (×3): 5 [IU] via SUBCUTANEOUS

## 2020-09-27 MED ORDER — MAGNESIUM SULFATE 2 GM/50ML IV SOLN
2.0000 g | Freq: Once | INTRAVENOUS | Status: AC
  Administered 2020-09-27: 2 g via INTRAVENOUS
  Filled 2020-09-27: qty 50

## 2020-09-27 MED ORDER — GABAPENTIN 100 MG PO CAPS
200.0000 mg | ORAL_CAPSULE | Freq: Three times a day (TID) | ORAL | Status: DC
  Filled 2020-09-27 (×2): qty 2

## 2020-09-27 MED ORDER — ALBUTEROL SULFATE (2.5 MG/3ML) 0.083% IN NEBU
2.5000 mg | INHALATION_SOLUTION | Freq: Once | RESPIRATORY_TRACT | Status: AC
  Administered 2020-09-27: 2.5 mg via RESPIRATORY_TRACT
  Filled 2020-09-27: qty 3

## 2020-09-27 MED ORDER — ONDANSETRON HCL 4 MG/2ML IJ SOLN
INTRAMUSCULAR | Status: AC
  Filled 2020-09-27: qty 2

## 2020-09-27 MED ORDER — METHYLPREDNISOLONE SODIUM SUCC 125 MG IJ SOLR: 60.0000 mg | Freq: Three times a day (TID) | INTRAMUSCULAR | Status: DC

## 2020-09-27 MED ORDER — PRAZOSIN HCL 1 MG PO CAPS
1.0000 mg | ORAL_CAPSULE | Freq: Every day | ORAL | Status: DC
  Filled 2020-09-27 (×7): qty 1

## 2020-09-27 MED ORDER — FERROUS SULFATE 325 (65 FE) MG PO TABS
325.0000 mg | ORAL_TABLET | Freq: Two times a day (BID) | ORAL | Status: DC
  Administered 2020-09-28 – 2020-10-01 (×7): 325 mg via ORAL
  Filled 2020-09-27 (×8): qty 1

## 2020-09-27 MED ORDER — ENOXAPARIN SODIUM 80 MG/0.8ML IJ SOSY
0.5000 mg/kg | PREFILLED_SYRINGE | INTRAMUSCULAR | Status: DC
  Administered 2020-09-27 – 2020-09-30 (×4): 65 mg via SUBCUTANEOUS
  Filled 2020-09-27 (×5): qty 0.8

## 2020-09-27 MED ORDER — FAMOTIDINE 20 MG PO TABS
20.0000 mg | ORAL_TABLET | Freq: Every day | ORAL | Status: DC
  Administered 2020-09-27 – 2020-10-01 (×5): 20 mg via ORAL
  Filled 2020-09-27 (×5): qty 1

## 2020-09-27 MED ORDER — IPRATROPIUM-ALBUTEROL 0.5-2.5 (3) MG/3ML IN SOLN: 3.0000 mL | RESPIRATORY_TRACT | Status: DC

## 2020-09-27 MED ORDER — BUSPIRONE HCL 5 MG PO TABS
7.5000 mg | ORAL_TABLET | Freq: Two times a day (BID) | ORAL | Status: DC
  Administered 2020-09-28 – 2020-10-01 (×7): 7.5 mg via ORAL
  Filled 2020-09-27 (×8): qty 2

## 2020-09-27 MED ORDER — PANTOPRAZOLE SODIUM 40 MG PO TBEC
40.0000 mg | DELAYED_RELEASE_TABLET | Freq: Two times a day (BID) | ORAL | Status: DC
  Administered 2020-09-28 – 2020-10-01 (×7): 40 mg via ORAL
  Filled 2020-09-27 (×7): qty 1

## 2020-09-27 MED ORDER — MIRTAZAPINE 30 MG PO TABS
30.0000 mg | ORAL_TABLET | Freq: Every day | ORAL | Status: DC
  Administered 2020-09-28 – 2020-09-30 (×3): 30 mg via ORAL
  Filled 2020-09-27 (×4): qty 1

## 2020-09-27 MED ORDER — TOBRAMYCIN-DEXAMETHASONE 0.3-0.1 % OP OINT
1.0000 "application " | TOPICAL_OINTMENT | Freq: Three times a day (TID) | OPHTHALMIC | Status: DC
  Administered 2020-09-27: 1 via OPHTHALMIC
  Filled 2020-09-27: qty 3.5

## 2020-09-27 MED ORDER — CYCLOBENZAPRINE HCL 10 MG PO TABS: 10.0000 mg | ORAL_TABLET | Freq: Three times a day (TID) | ORAL | Status: DC | PRN

## 2020-09-27 MED ORDER — ONDANSETRON HCL 4 MG/2ML IJ SOLN
4.0000 mg | Freq: Four times a day (QID) | INTRAMUSCULAR | Status: DC | PRN
  Administered 2020-09-27: 4 mg via INTRAVENOUS

## 2020-09-27 MED ORDER — IPRATROPIUM-ALBUTEROL 0.5-2.5 (3) MG/3ML IN SOLN
3.0000 mL | RESPIRATORY_TRACT | Status: DC
  Administered 2020-09-27 – 2020-09-28 (×5): 3 mL via RESPIRATORY_TRACT
  Filled 2020-09-27 (×5): qty 3

## 2020-09-27 MED ORDER — ACETAMINOPHEN 325 MG PO TABS
650.0000 mg | ORAL_TABLET | Freq: Four times a day (QID) | ORAL | Status: DC | PRN
  Administered 2020-09-27 – 2020-09-30 (×4): 650 mg via ORAL
  Filled 2020-09-27 (×4): qty 2

## 2020-09-27 MED ORDER — ENOXAPARIN SODIUM 40 MG/0.4ML IJ SOSY: 40.0000 mg | PREFILLED_SYRINGE | INTRAMUSCULAR | Status: DC

## 2020-09-27 NOTE — H&P (Signed)
TRH H&P   Patient Demographics:    Elizabeth Bautista, is a 45 y.o. female  MRN: [id number]   DOB - 04-20-1976  Admit Date - 09/27/2020  Outpatient Primary MD for the patient is [name], MD  Referring MD/NP/PA: PA Idol  Outpatient Specialists: Pulmonary Dr Halford Chessman    Patient coming from: Home  Chief Complaint  Patient presents with  . Shortness of Breath      HPI:    Elizabeth Bautista  is a 45 y.o. female, with medical history significant forasthma, anemia, seizures, sleep apnea,hypertension, diabetes mellitus, rheumatoid arthritis,.  History of COVID infection in 2021, patient presents to ED secondary for shortness of breath, and with known history of asthma exacerbation in the past, requiring dilatation twice last year, patient reports he has been compliant with her medications including Pulmicort, albuterol and nebulizer treatment, just finished prednisone taper day before yesterday, she was seen by her pulmonary yesterday which she was feeling better, report this morning she has significantly worsening dyspnea and wheezing which prompted her to come to ED, she denies fever, chills, chest pain but she does report tightness in her chest, no nausea or vomiting, no abdominal pain. - in ED there is no hypoxia, but she is with audible wheezing, with minimal improvement with 1 hour continuous nebulizer treatment, and 125 mg of IV Solu-Medrol, chest x-ray with no pneumonia, significant only for cardiomegaly, [facility] hospitalist consulted to admit.    Review of systems:    In addition to the HPI above,  No Fever-chills, No Headache, No changes with Vision or hearing, No problems swallowing food or Liquids, Presents with cough, nonproductive, and worsening dyspnea. No Abdominal pain, No Nausea or Vommitting, Bowel movements are regular, No Blood in stool or Urine, No dysuria, No new skin  rashes or bruises, No new joints pains-aches,  No new weakness, tingling, numbness in any extremity, No recent weight gain or loss, No polyuria, polydypsia or polyphagia, No significant Mental Stressors.  A full 10 point Review of Systems was done, except as stated above, all other Review of Systems were negative.   With Past History of the following :    Past Medical History:  Diagnosis Date  . Anemia   . Asthma   . COVID-19 virus infection 05/16/2020   04/27/20 dx covid-19, not hospitalized  . Diabetes ([place])    on Metformin  . Eczema   . Edema   . 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 HTN (hypertension)   . Rheumatoid arthritis ([place])    on Plaquenil   . Seizures ([place])    last seizure 2019  . Sleep apnea   . Sleep apnea       Past Surgical History:  Procedure Laterality Date  . APPENDECTOMY    . CESAREAN  SECTION     x3      Social History:     Social History   Tobacco Use  . Smoking status: Former Smoker    Years: 10.00    Types: Cigarettes    Quit date: 06/02/2014    Years since quitting: 6.3  . Smokeless tobacco: Never Used  . Tobacco comment: during the 10 years of smoking, smoked 2-3 cigarettes/day  Substance Use Topics  . Alcohol use: Not Currently      Family History :     Family History  Problem Relation Age of Onset  . High blood pressure Mother   . Diabetes Father   . Diabetes Sister   . Heart Problems Sister   . Diabetes Brother   . Diabetes Paternal Grandmother   . Diabetes Other        father's side "everybody died from Diabetes"  . High blood pressure Other        mother's side, multiple siblings with this   . Heart attack Other        family member on mother's side   . Diabetes Paternal Aunt   . Seizures Cousin        not sibings to the other cousins with seizures  . Breast cancer Cousin   . Seizures Cousin        not sibings to the  other cousins with seizures  . Seizures Cousin        not sibings to the other cousins with seizures  . Cervical cancer Maternal Aunt   . Breast cancer Cousin   . Dementia Maternal Aunt   . Asthma Maternal Aunt   . Heart Problems Maternal Grandmother   . Asthma Child   . Asthma Child   . Asthma Daughter   . Angioedema Daughter   . Asthma Son     Home Medications:   Prior to Admission medications   Medication Sig Start Date End Date Taking? Authorizing Provider  albuterol (PROAIR HFA) 108 (90 Base) MCG/ACT inhaler 2 puffs every 4 hours as needed only  if your can't catch your breath 08/22/20   [name], MD  albuterol (PROVENTIL) (2.5 MG/3ML) 0.083% nebulizer solution Take 3 mLs (2.5 mg total) by nebulization every 6 (six) hours as needed for wheezing or shortness of breath. 09/18/20   [name], MD  amLODipine (NORVASC) 5 MG tablet Take 1 tablet (5 mg total) by mouth daily. 08/22/20   [name], MD  blood glucose meter kit and supplies Dispense based on patient and insurance preference. Use up to four times daily as directed. (FOR ICD-10 E10.9, E11.9). 02/01/20   Ailene Ards, NP  Blood Pressure Monitoring [facility]) MISC 1 each by Does not apply route daily. 01/18/20   Ailene Ards, NP  budesonide (PULMICORT) 0.5 MG/2ML nebulizer solution Take 2 mLs (0.5 mg total) by nebulization 2 (two) times daily. 08/22/20   [name], MD  busPIRone (BUSPAR) 7.5 MG tablet Take 7.5 mg by mouth 2 (two) times daily. 09/05/19   [provider]  cetirizine (ZYRTEC) 10 MG tablet Take 1 tablet (10 mg total) by mouth daily. 06/08/20   Valentina Shaggy, MD  Cholecalciferol 1.25 MG (50000 UT) TABS Take 1 tablet by mouth daily. 02/01/20   Ailene Ards, NP  cyclobenzaprine (FLEXERIL) 10 MG tablet Take 10 mg by mouth 3 (three) times daily as needed for muscle spasms.  08/27/19   [provider]  EPINEPHrine 0.3  mg/0.3 mL IJ SOAJ injection Inject 0.3 mg into the muscle as  needed for anaphylaxis. 09/26/20   Chesley Mires, MD  escitalopram (LEXAPRO) 20 MG tablet Take 1 tablet by mouth every evening. 02/29/20   [provider]  famotidine (PEPCID) 20 MG tablet Take 20 mg by mouth daily. 07/31/19   [provider]  ferrous sulfate 325 (65 FE) MG tablet Take 325 mg by mouth 2 (two) times daily. 05/17/19   [provider]  fluticasone (CUTIVATE) 0.005 % ointment Apply 1 application topically daily. 10/11/19   [provider]  fluticasone (FLONASE) 50 MCG/ACT nasal spray Place 1 spray into both nostrils daily. 07/25/20   Valentina Shaggy, MD  furosemide (LASIX) 20 MG tablet Take 20 mg by mouth daily. 03/09/20   [provider]  gabapentin (NEURONTIN) 100 MG capsule Take 2 capsules (200 mg total) by mouth 3 (three) times daily. 06/12/20   Ward Givens, NP  hydrochlorothiazide (HYDRODIURIL) 25 MG tablet Take 1 tablet (25 mg total) by mouth daily. For high blood pressure. 08/22/20   [name], MD  hydroxychloroquine (PLAQUENIL) 200 MG tablet Take 1 tablet (200 mg total) by mouth 2 (two) times daily. 02/20/20   Ailene Ards, NP  hydrOXYzine (ATARAX/VISTARIL) 25 MG tablet Take 25 mg by mouth 2 (two) times daily. 02/01/20   [provider]  ibuprofen (ADVIL) 800 MG tablet Take 1 tablet (800 mg total) by mouth every 8 (eight) hours as needed. 02/20/20   Ailene Ards, NP  KLOR-CON M20 20 MEQ tablet TAKE 1 TABLET BY MOUTH DAILY ONLY WHEN YOU TAKE FUROSEMIDE. Patient taking differently: 20 mEq daily. Only when you take Furosemide. 03/05/20   Doree Albee, MD  levETIRAcetam (KEPPRA) 500 MG tablet Take 1 tablet (500 mg total) by mouth 2 (two) times daily. 03/13/20 04/12/20  Manuella Ghazi, Pratik D, DO  metoprolol tartrate (LOPRESSOR) 50 MG tablet TAKE 1 TABLET 2 HR PRIOR TO CARDIAC PROCEDURE 04/16/20   Buford Dresser, MD  mirtazapine (REMERON) 30 MG tablet Take 30 mg by mouth at bedtime. 03/30/20   [provider]   montelukast (SINGULAIR) 10 MG tablet Take 1 tablet (10 mg total) by mouth at bedtime. 07/25/20 08/24/20  Valentina Shaggy, MD  pantoprazole (PROTONIX) 40 MG tablet Take 1 tablet (40 mg total) by mouth daily. Take 30-60 min before first meal of the day 02/09/20   Tanda Rockers, MD  prazosin (MINIPRESS) 1 MG capsule Take 1 mg by mouth at bedtime. 02/29/20   [provider]  QUEtiapine (SEROQUEL) 25 MG tablet Take 25 mg by mouth 2 (two) times daily.  09/05/19   [provider]  tobramycin-dexamethasone [facility]) ophthalmic ointment Place 1 application into the right eye 3 (three) times daily. 08/22/20   [name], MD  triamcinolone (KENALOG) 0.025 % ointment Apply 1 application topically 2 (two) times daily. 08/22/20   [name], MD  triamcinolone ointment (KENALOG) 0.5 % Apply 1 application topically as needed. 08/22/20   [name], MD     Allergies:     Allergies  Allergen Reactions  . Phenytoin Anaphylaxis    Other reaction(s): swelling and heart rate decreased  . Phenylbutazones      Physical Exam:   Vitals  Blood pressure (!) 147/73, pulse (!) 106, temperature (!) 97.5 F (36.4 C), temperature source Oral, resp. rate (!) 24, height $RemoveBe'5\' 5"'FHWscCqrY$  (1.651 m), weight 132.5 kg, SpO2 100 %.   1. General well-developed female, sitting  in bed, in mild discomfort due to dyspnea  2. Normal affect and insight, Not Suicidal or Homicidal, Awake Alert, Oriented X 3.  3. No F.N deficits, ALL C.Nerves Intact, Strength 5/5 all 4 extremities, Sensation intact all 4 extremities, Plantars down going.  4. Ears and Eyes appear Normal, Conjunctivae clear, PERRLA. Moist Oral Mucosa.  5. Supple Neck, No JVD, No cervical lymphadenopathy appriciated, No Carotid Bruits.  6. Symmetrical Chest wall movement, Minister entry bilaterally, diffuse audible wheezing, mild use of accessory muscles, tachypneic  7.  Tachycardic, No Gallops, Rubs or Murmurs, No Parasternal Heave.  8.  Positive Bowel Sounds, Abdomen Soft, No tenderness, No organomegaly appriciated,No rebound -guarding or rigidity.  9.  No Cyanosis, Normal Skin Turgor, No Skin Rash or Bruise.  10. Good muscle tone,  joints appear normal , no effusions, Normal ROM.  11. No Palpable Lymph Nodes in Neck or Axillae    Data Review:    CBC Recent Labs  Lab 09/27/20 1212  WBC 7.8  HGB 9.7*  HCT 33.8*  PLT 232  MCV 73.2*  MCH 21.0*  MCHC 28.7*  RDW 19.7*  LYMPHSABS 1.7  MONOABS 0.4  EOSABS 0.4  BASOSABS 0.0   ------------------------------------------------------------------------------------------------------------------  Chemistries  Recent Labs  Lab 09/27/20 1212  NA 132*  K 4.0  CL 99  CO2 24  GLUCOSE 110*  BUN 8  CREATININE 0.62  CALCIUM 9.4   ------------------------------------------------------------------------------------------------------------------ estimated creatinine clearance is 121 mL/min (by C-G formula based on SCr of 0.62 mg/dL). ------------------------------------------------------------------------------------------------------------------ No results for input(s): TSH, T4TOTAL, T3FREE, THYROIDAB in the last 72 hours.  Invalid input(s): FREET3  Coagulation profile No results for input(s): INR, PROTIME in the last 168 hours. ------------------------------------------------------------------------------------------------------------------- No results for input(s): DDIMER in the last 72 hours. -------------------------------------------------------------------------------------------------------------------  Cardiac Enzymes No results for input(s): CKMB, TROPONINI, MYOGLOBIN in the last 168 hours.  Invalid input(s): CK ------------------------------------------------------------------------------------------------------------------ No results found for:  BNP   ---------------------------------------------------------------------------------------------------------------  Urinalysis    Component Value Date/Time   COLORURINE YELLOW 10/19/2019 [place] 10/19/2019 1549   LABSPEC 1.030 10/19/2019 1549   PHURINE 5.0 10/19/2019 1549   GLUCOSEU 150 (A) 10/19/2019 1549   HGBUR NEGATIVE 10/19/2019 1549   BILIRUBINUR NEGATIVE 10/19/2019 1549   KETONESUR 5 (A) 10/19/2019 1549   PROTEINUR NEGATIVE 10/19/2019 1549   NITRITE NEGATIVE 10/19/2019 1549   LEUKOCYTESUR NEGATIVE 10/19/2019 1549    ----------------------------------------------------------------------------------------------------------------   Imaging Results:    DG Chest Port 1 View  Result Date: 09/27/2020 CLINICAL DATA:  Shortness of breath EXAM: PORTABLE CHEST 1 VIEW COMPARISON:  Chest radiograph and chest CT May 10, 2020 FINDINGS: Lungs are clear. Heart is enlarged, stable, with pulmonary vascularity normal. No adenopathy. No bone lesions. IMPRESSION: Stable cardiomegaly.  No edema or airspace opacity. Electronically Signed   By: Lowella Grip III M.D.   On: 09/27/2020 12:48      Assessment & Plan:    Active Problems:   Depression   Class 3 severe obesity with serious comorbidity and body mass index (BMI) of 45.0 to 49.9 in adult [facility])   Essential hypertension   GERD (gastroesophageal reflux disease)   History of seizures   Asthma exacerbation  Severe/acute asthma exacerbation -Patient with significant dyspnea, significant wheezing, with known history of asthma. -Given her significant wheezing and  increased work of breathing, she will be admitted to stepdown. -Start on IV Solu-Medrol, will keep on 60 mg IV every 6 hours -Scheduled duo nebs every 4 hours -She received IV magnesium  in ED. -Continue with Singulair. -Chest x-ray with no evidence of pneumonia, no indication for antibiotics -Hold beta-blockers(metoprolol) - will consult  pulmonary -She is supposed to start Nucala as an outpatient  OSA - contine with CPAP  morbidobesityandsleep apnea -BMI of 48  anxiety/Depression -Continue home medication include Seroquel, BuSpar and Lexapro   Hypertension -Continue with home medications, but will hold metoprolol given her severe asthma  Rheumatoid arthritis ([place]) -continue plaquenil  GERD/GI prophylaxis -Continue with Pepcid and Protonix, will increase Protonix to twice daily given significant dose of steroids  hyperglycemia/Diabetes Mellitus type 2 -Ports is controlled, not on any home medications, but I will start insulin sliding scale as anticipated to have high readings with steroids  History of seizures -Patient report she is not taking Keppra anymore as she was instructed by her neurologist due to no recurrence.    DVT Prophylaxis  Lovenox  AM Labs Ordered, also please review Full Orders  Family Communication: Admission, patients condition and plan of care including tests being ordered have been discussed with the patient  who indicate understanding and agree with the plan and Code Status.  Code Status inpatient  Likely DC to  Home  Condition GUARDED    Consults called: none    Admission status: inpatient    Time spent in minutes : 60 minutes   Phillips Climes M.D on 09/27/2020 at [place] PM   [facility] - Office  [phone]

## 2020-09-27 NOTE — ED Notes (Signed)
Pt in bed, pt has continuous neb in place, pt denies significant improvement in her breathing, pt continues to have audible wheeze. Pt has minor decrease in RR

## 2020-09-27 NOTE — ED Notes (Signed)
Pt in bed, neb complete, pt states that her chest feels more open, pt continues to have wheeze and is talking in 2-3 word sentences.

## 2020-09-27 NOTE — ED Notes (Signed)
Report called to Step down, RN Vincenza Hews

## 2020-09-27 NOTE — Progress Notes (Signed)
[facility] Psychiatric Consultant Case Review   Elizabeth Bautista is a 45 y.o. year old female with a history of non epileptic seizure, depression, anxiety, type II diabetes, hypertension, OSA, upper airway cough syndromes, RA GERD. She reportedly had COVID in Nov 2021.   Per chart review, she was evaluated by neurology due to history of seizure- EEG reportedly normal. Gabapentin 200 mg TID was started. They recommended as needed follow up.     Assessment/Provisional Diagnosis # History of depression, anxiety She is on variety of psychotropics, and their indications are unclear. Will continue to evaluate and adjust medication accordingly. Hopefully taper off quetiapine in the future to avoid its potential risks.    Recommendation - Continue current medication  - She is on lexapro 20 mg daily, mirtazapine 30 mg at night,  quetiapine 25 mg bid, buspar 7.5 mg twice a day,  prazosin 1 mg at night,  Thank you for your consult. We will continue to follow the patient. Please contact [facility]  for any questions or concerns.   The above treatment considerations and suggestions are based on consultation with the [facility] specialist and/or PCP and a review of information available in the shared registry and the patient's Electronic Health Record (EHR). I have not personally examined the patient. All recommendations should be implemented with consideration of the patient's relevant prior history and current clinical status. Please feel free to call me with any questions about the care of this patient.

## 2020-09-27 NOTE — ED Triage Notes (Signed)
Pt HX of asthma. Pt c/o of sob and productive cough since this morning.

## 2020-09-27 NOTE — ED Provider Notes (Signed)
[facility] Provider Note   CSN: [id number] Arrival date & time: 09/27/20  1142     History Chief Complaint  Patient presents with  . Shortness of Breath    Elizabeth Bautista is a 45 y.o. female with a history as outlined below, most significant for chronic asthma, diabetes, hypertension and rheumatoid arthritis, prior history of COVID-19 infection in 2021 presenting for evaluation of an acute asthma exacerbation.  She is currently on multiple medications including Pulmicort, albuterol HFA and nebulizers, and recently completed a prednisone pulse dosing 1 week ago for exacerbation of her asthma.  She woke up this morning with severe wheezing and difficulty with shortness of breath.  Her cough has been productive of a white to clear sputum.  She denies fevers or chills, denies chest pain but does endorse tightness in her chest.  She has had no nausea or vomiting, no abdominal pain.  She does report increasing fatigue secondary to her shortness of breath.  She has been admitted to the hospital in the past for asthma flares, she has never required intubation.  She denies peripheral edema.  Denies overt orthopnea, but feels a little bit better with the breathing when sitting.  HPI     Past Medical History:  Diagnosis Date  . Anemia   . Asthma   . COVID-19 virus infection 05/16/2020   04/27/20 dx covid-19, not hospitalized  . Diabetes ([place])    on Metformin  . Eczema   . Edema   . 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 HTN (hypertension)   . Rheumatoid arthritis ([place])    on Plaquenil   . Seizures ([place])    last seizure 2019  . Sleep apnea   . Sleep apnea     Patient Active Problem List   Diagnosis Date Noted  . Asthma exacerbation 09/27/2020  . Acute hip pain, left 09/19/2020  . Sciatica 09/11/2020  . Encounter for screening fecal occult blood testing 06/04/2020  . Encounter  for gynecological examination with Papanicolaou smear of cervix 06/04/2020  . Screening examination for STD (sexually transmitted disease) 06/04/2020  . Dysmenorrhea 06/04/2020  . Menorrhagia with irregular cycle 06/04/2020  . Anxiety and depression 06/04/2020  . Chronic low back pain 05/24/2020  . Physical deconditioning 05/24/2020  . Asthma, moderate persistent 03/08/2020  . GERD (gastroesophageal reflux disease) 03/07/2020  . Anxiety 03/07/2020  . History of seizures 03/07/2020  . Upper airway cough syndrome 02/10/2020  . Swelling of lower extremity 02/01/2020  . Essential hypertension 02/01/2020  . Vitamin D deficiency 02/01/2020  . Type 2 diabetes mellitus with hyperglycemia, without long-term current use of insulin ([place]) 02/01/2020  . Anemia 02/01/2020  . Class 3 severe obesity with serious comorbidity and body mass index (BMI) of 45.0 to 49.9 in adult [facility]) 10/22/2019  . OSA on CPAP 10/18/2019  . Depression 10/18/2019  . Rheumatoid arthritis ([place]) 10/18/2019    Past Surgical History:  Procedure Laterality Date  . APPENDECTOMY    . CESAREAN SECTION     x3     OB History    Gravida  5   Para  5   Term  5   Preterm      AB      Living  5     SAB      IAB      Ectopic      Multiple  Live Births              Family History  Problem Relation Age of Onset  . High blood pressure Mother   . Diabetes Father   . Diabetes Sister   . Heart Problems Sister   . Diabetes Brother   . Diabetes Paternal Grandmother   . Diabetes Other        father's side "everybody died from Diabetes"  . High blood pressure Other        mother's side, multiple siblings with this   . Heart attack Other        family member on mother's side   . Diabetes Paternal Aunt   . Seizures Cousin        not sibings to the other cousins with seizures  . Breast cancer Cousin   . Seizures Cousin        not sibings to the other cousins with seizures  . Seizures Cousin         not sibings to the other cousins with seizures  . Cervical cancer Maternal Aunt   . Breast cancer Cousin   . Dementia Maternal Aunt   . Asthma Maternal Aunt   . Heart Problems Maternal Grandmother   . Asthma Child   . Asthma Child   . Asthma Daughter   . Angioedema Daughter   . Asthma Son     Social History   Tobacco Use  . Smoking status: Former Smoker    Years: 10.00    Types: Cigarettes    Quit date: 06/02/2014    Years since quitting: 6.3  . Smokeless tobacco: Never Used  . Tobacco comment: during the 10 years of smoking, smoked 2-3 cigarettes/day  Vaping Use  . Vaping Use: Never used  Substance Use Topics  . Alcohol use: Not Currently  . Drug use: Not Currently    Home Medications Prior to Admission medications   Medication Sig Start Date End Date Taking? Authorizing Provider  albuterol (PROAIR HFA) 108 (90 Base) MCG/ACT inhaler 2 puffs every 4 hours as needed only  if your can't catch your breath 08/22/20   [name], MD  albuterol (PROVENTIL) (2.5 MG/3ML) 0.083% nebulizer solution Take 3 mLs (2.5 mg total) by nebulization every 6 (six) hours as needed for wheezing or shortness of breath. 09/18/20   [name], MD  amLODipine (NORVASC) 5 MG tablet Take 1 tablet (5 mg total) by mouth daily. 08/22/20   [name], MD  blood glucose meter kit and supplies Dispense based on patient and insurance preference. Use up to four times daily as directed. (FOR ICD-10 E10.9, E11.9). 02/01/20   Ailene Ards, NP  Blood Pressure Monitoring [facility]) MISC 1 each by Does not apply route daily. 01/18/20   Ailene Ards, NP  budesonide (PULMICORT) 0.5 MG/2ML nebulizer solution Take 2 mLs (0.5 mg total) by nebulization 2 (two) times daily. 08/22/20   [name], MD  busPIRone (BUSPAR) 7.5 MG tablet Take 7.5 mg by mouth 2 (two) times daily. 09/05/19   [provider]  cetirizine (ZYRTEC) 10 MG tablet Take 1 tablet (10 mg total) by mouth daily. 06/08/20   Valentina Shaggy, MD  Cholecalciferol 1.25 MG (50000 UT) TABS Take 1 tablet by mouth daily. 02/01/20   Ailene Ards, NP  cyclobenzaprine (FLEXERIL) 10 MG tablet Take 10 mg by mouth 3 (three) times daily as needed for muscle spasms.  08/27/19   [provider]  EPINEPHrine 0.3 mg/0.3 mL IJ SOAJ injection Inject 0.3 mg into the muscle as needed for anaphylaxis. 09/26/20   Chesley Mires, MD  escitalopram (LEXAPRO) 20 MG tablet Take 1 tablet by mouth every evening. 02/29/20   [provider]  famotidine (PEPCID) 20 MG tablet Take 20 mg by mouth daily. 07/31/19   [provider]  ferrous sulfate 325 (65 FE) MG tablet Take 325 mg by mouth 2 (two) times daily. 05/17/19   [provider]  fluticasone (CUTIVATE) 0.005 % ointment Apply 1 application topically daily. 10/11/19   [provider]  fluticasone (FLONASE) 50 MCG/ACT nasal spray Place 1 spray into both nostrils daily. 07/25/20   Valentina Shaggy, MD  furosemide (LASIX) 20 MG tablet Take 20 mg by mouth daily. 03/09/20   [provider]  gabapentin (NEURONTIN) 100 MG capsule Take 2 capsules (200 mg total) by mouth 3 (three) times daily. 06/12/20   Ward Givens, NP  hydrochlorothiazide (HYDRODIURIL) 25 MG tablet Take 1 tablet (25 mg total) by mouth daily. For high blood pressure. 08/22/20   [name], MD  hydroxychloroquine (PLAQUENIL) 200 MG tablet Take 1 tablet (200 mg total) by mouth 2 (two) times daily. 02/20/20   Ailene Ards, NP  hydrOXYzine (ATARAX/VISTARIL) 25 MG tablet Take 25 mg by mouth 2 (two) times daily. 02/01/20   [provider]  ibuprofen (ADVIL) 800 MG tablet Take 1 tablet (800 mg total) by mouth every 8 (eight) hours as needed. 02/20/20   Ailene Ards, NP  KLOR-CON M20 20 MEQ tablet TAKE 1 TABLET BY MOUTH DAILY ONLY WHEN YOU TAKE FUROSEMIDE. Patient taking differently: 20 mEq daily. Only when you take Furosemide. 03/05/20   Doree Albee, MD  levETIRAcetam (KEPPRA) 500 MG  tablet Take 1 tablet (500 mg total) by mouth 2 (two) times daily. 03/13/20 04/12/20  Manuella Ghazi, Pratik D, DO  metoprolol tartrate (LOPRESSOR) 50 MG tablet TAKE 1 TABLET 2 HR PRIOR TO CARDIAC PROCEDURE 04/16/20   Buford Dresser, MD  mirtazapine (REMERON) 30 MG tablet Take 30 mg by mouth at bedtime. 03/30/20   [provider]  montelukast (SINGULAIR) 10 MG tablet Take 1 tablet (10 mg total) by mouth at bedtime. 07/25/20 08/24/20  Valentina Shaggy, MD  pantoprazole (PROTONIX) 40 MG tablet Take 1 tablet (40 mg total) by mouth daily. Take 30-60 min before first meal of the day 02/09/20   Tanda Rockers, MD  prazosin (MINIPRESS) 1 MG capsule Take 1 mg by mouth at bedtime. 02/29/20   [provider]  QUEtiapine (SEROQUEL) 25 MG tablet Take 25 mg by mouth 2 (two) times daily.  09/05/19   [provider]  tobramycin-dexamethasone [facility]) ophthalmic ointment Place 1 application into the right eye 3 (three) times daily. 08/22/20   [name], MD  triamcinolone (KENALOG) 0.025 % ointment Apply 1 application topically 2 (two) times daily. 08/22/20   [name], MD  triamcinolone ointment (KENALOG) 0.5 % Apply 1 application topically as needed. 08/22/20   [name], MD    Allergies    Phenytoin and Phenylbutazones  Review of Systems   Review of Systems  Constitutional: Negative for chills and fever.  HENT: Negative for congestion and sore throat.   Eyes: Negative.   Respiratory: Positive for cough, shortness of breath and wheezing. Negative for chest tightness.   Cardiovascular: Negative for chest pain, palpitations and leg swelling.  Gastrointestinal: Negative for abdominal pain, nausea and vomiting.  Genitourinary: Negative.  Musculoskeletal: Negative for arthralgias, joint swelling and neck pain.  Skin: Negative.  Negative for rash and wound.  Neurological: Negative for dizziness, weakness, light-headedness, numbness and headaches.   Psychiatric/Behavioral: Negative.   All other systems reviewed and are negative.   Physical Exam Updated Vital Signs BP (!) 147/73   Pulse (!) 106   Temp (!) 97.5 F (36.4 C) (Oral)   Resp (!) 24   Ht 5' 5"  (1.651 m)   Wt 132.5 kg   SpO2 100%   BMI 48.59 kg/m   Physical Exam Vitals and nursing note reviewed.  Constitutional:      Appearance: She is well-developed.  HENT:     Head: Normocephalic and atraumatic.  Eyes:     Conjunctiva/sclera: Conjunctivae normal.  Cardiovascular:     Rate and Rhythm: Regular rhythm. Tachycardia present.     Heart sounds: Normal heart sounds.  Pulmonary:     Effort: Pulmonary effort is normal.     Breath sounds: Decreased breath sounds and wheezing present. No rhonchi or rales.     Comments: Decreased breath sounds with expiratory wheeze throughout all lung fields.  Prolonged expirations. Abdominal:     General: Bowel sounds are normal.     Palpations: Abdomen is soft.     Tenderness: There is no abdominal tenderness.  Musculoskeletal:        General: Normal range of motion.     Cervical back: Normal range of motion.     Right lower leg: No edema.     Left lower leg: No edema.  Skin:    General: Skin is warm and dry.  Neurological:     General: No focal deficit present.     Mental Status: She is alert.  Psychiatric:        Mood and Affect: Mood is anxious.     ED Results / Procedures / Treatments   Labs (all labs ordered are listed, but only abnormal results are displayed) Labs Reviewed  CBC WITH DIFFERENTIAL/PLATELET - Abnormal; Notable for the following components:      Result Value   Hemoglobin 9.7 (*)    HCT 33.8 (*)    MCV 73.2 (*)    MCH 21.0 (*)    MCHC 28.7 (*)    RDW 19.7 (*)    All other components within normal limits  BASIC METABOLIC PANEL - Abnormal; Notable for the following components:   Sodium 132 (*)    Glucose, Bld 110 (*)    All other components within normal limits  RESP PANEL BY RT-PCR (FLU A&B,  COVID) ARPGX2  BRAIN NATRIURETIC PEPTIDE  POC SARS CORONAVIRUS 2 AG -  ED    EKG None  Radiology DG Chest Port 1 View  Result Date: 09/27/2020 CLINICAL DATA:  Shortness of breath EXAM: PORTABLE CHEST 1 VIEW COMPARISON:  Chest radiograph and chest CT May 10, 2020 FINDINGS: Lungs are clear. Heart is enlarged, stable, with pulmonary vascularity normal. No adenopathy. No bone lesions. IMPRESSION: Stable cardiomegaly.  No edema or airspace opacity. Electronically Signed   By: Lowella Grip III M.D.   On: 09/27/2020 12:48    Procedures Procedures   Medications Ordered in ED Medications  ipratropium-albuterol (DUONEB) 0.5-2.5 (3) MG/3ML nebulizer solution 3 mL (has no administration in time range)  methylPREDNISolone sodium succinate (SOLU-MEDROL) 125 mg/2 mL injection 60 mg (has no administration in time range)  methylPREDNISolone sodium succinate (SOLU-MEDROL) 125 mg/2 mL injection 125 mg (125 mg Intravenous Given 09/27/20 1238)  magnesium sulfate IVPB  2 g 50 mL (0 g Intravenous Stopped 09/27/20 1541)  ipratropium-albuterol (DUONEB) 0.5-2.5 (3) MG/3ML nebulizer solution 3 mL (3 mLs Nebulization Given 09/27/20 1241)  albuterol (PROVENTIL) (2.5 MG/3ML) 0.083% nebulizer solution 2.5 mg (2.5 mg Nebulization Given 09/27/20 1241)  albuterol (PROVENTIL,VENTOLIN) solution continuous neb (10 mg/hr Nebulization Given 09/27/20 1441)    ED Course  I have reviewed the triage vital signs and the nursing notes.  Pertinent labs & imaging results that were available during my care of the patient were reviewed by me and considered in my medical decision making (see chart for details).    MDM Rules/Calculators/A&P                          Patient with acute asthma exacerbation has not responded to multiple treatments here including nebulizer treatments, Solu-Medrol injection and magnesium sulfate.  She will require admission for further management of this asthma flare.  She has not had hypoxia while  here but was fairly tachypneic when she first arrived, her rate of breathing is improved since first arrival.  Chest x-ray is clear, she does have increasing blood pressures while here, no history of CHF, no exam findings to suggest this condition, but a BNP was added to rule out.  Patient is agreeable for admission.  She has been admitted for asthma in the past, has never required intubation.  Discussed with Dr. Waldron Labs who accepts patient for admission. Final Clinical Impression(s) / ED Diagnoses Final diagnoses:  Severe asthma with exacerbation, unspecified whether persistent    Rx / DC Orders ED Discharge Orders    None       Landis Martins 09/27/20 1632    Davonna Belling, MD 09/29/20 [phone]

## 2020-09-28 ENCOUNTER — Ambulatory Visit: Payer: Medicaid Other

## 2020-09-28 DIAGNOSIS — K219 Gastro-esophageal reflux disease without esophagitis: Secondary | ICD-10-CM

## 2020-09-28 DIAGNOSIS — Z87898 Personal history of other specified conditions: Secondary | ICD-10-CM

## 2020-09-28 DIAGNOSIS — J4551 Severe persistent asthma with (acute) exacerbation: Secondary | ICD-10-CM | POA: Diagnosis not present

## 2020-09-28 LAB — BASIC METABOLIC PANEL
Anion gap: 9 (ref 5–15)
BUN: 7 mg/dL (ref 6–20)
CO2: 23 mmol/L (ref 22–32)
Calcium: 8.8 mg/dL — ABNORMAL LOW (ref 8.9–10.3)
Chloride: 104 mmol/L (ref 98–111)
Creatinine, Ser: 0.59 mg/dL (ref 0.44–1.00)
GFR, Estimated: >60 mL/min (ref >60)
Glucose, Bld: 249 mg/dL — ABNORMAL HIGH (ref 70–99)
Potassium: 4.2 mmol/L (ref 3.5–5.1)
Sodium: 136 mmol/L (ref 135–145)

## 2020-09-28 LAB — CBC
HCT: 34.3 % — ABNORMAL LOW (ref 36.0–46.0)
Hemoglobin: 9.8 g/dL — ABNORMAL LOW (ref 12.0–15.0)
MCH: 20.9 pg — ABNORMAL LOW (ref 26.0–34.0)
MCHC: 28.6 g/dL — ABNORMAL LOW (ref 30.0–36.0)
MCV: 73 fL — ABNORMAL LOW (ref 80.0–100.0)
Platelets: 241 10*3/uL (ref 150–400)
RBC: 4.7 MIL/uL (ref 3.87–5.11)
RDW: 19.7 % — ABNORMAL HIGH (ref 11.5–15.5)
WBC: 14.6 10*3/uL — ABNORMAL HIGH (ref 4.0–10.5)
nRBC: 0 % (ref 0.0–0.2)

## 2020-09-28 LAB — MRSA PCR SCREENING: MRSA by PCR: NEGATIVE

## 2020-09-28 LAB — GLUCOSE, CAPILLARY
Glucose-Capillary: 224 mg/dL — ABNORMAL HIGH (ref 70–99)
Glucose-Capillary: 198 mg/dL — ABNORMAL HIGH (ref 70–99)
Glucose-Capillary: 241 mg/dL — ABNORMAL HIGH (ref 70–99)
Glucose-Capillary: 261 mg/dL — ABNORMAL HIGH (ref 70–99)

## 2020-09-28 MED ORDER — ARFORMOTEROL TARTRATE 15 MCG/2ML IN NEBU
15.0000 ug | INHALATION_SOLUTION | Freq: Two times a day (BID) | RESPIRATORY_TRACT | Status: DC
  Administered 2020-09-28 – 2020-10-01 (×6): 15 ug via RESPIRATORY_TRACT
  Filled 2020-09-28 (×6): qty 2

## 2020-09-28 MED ORDER — GABAPENTIN 100 MG PO CAPS: 200.0000 mg | ORAL_CAPSULE | Freq: Three times a day (TID) | ORAL | Status: DC | PRN

## 2020-09-28 MED ORDER — FAMOTIDINE 20 MG PO TABS
20.0000 mg | ORAL_TABLET | Freq: Two times a day (BID) | ORAL | Status: DC
  Administered 2020-09-28 – 2020-10-01 (×4): 20 mg via ORAL
  Filled 2020-09-28 (×5): qty 1

## 2020-09-28 MED ORDER — CHLORHEXIDINE GLUCONATE CLOTH 2 % EX PADS
6.0000 | MEDICATED_PAD | Freq: Every day | CUTANEOUS | Status: DC
  Administered 2020-09-28 – 2020-10-01 (×4): 6 via TOPICAL

## 2020-09-28 MED ORDER — MONTELUKAST SODIUM 10 MG PO TABS
10.0000 mg | ORAL_TABLET | Freq: Every day | ORAL | Status: DC
  Administered 2020-09-28 – 2020-09-30 (×3): 10 mg via ORAL
  Filled 2020-09-28 (×3): qty 1

## 2020-09-28 MED ORDER — SALINE SPRAY 0.65 % NA SOLN
2.0000 | Freq: Two times a day (BID) | NASAL | Status: DC
  Administered 2020-09-28 – 2020-09-30 (×5): 2 via NASAL
  Filled 2020-09-28: qty 44

## 2020-09-28 MED ORDER — QUETIAPINE FUMARATE 25 MG PO TABS
25.0000 mg | ORAL_TABLET | Freq: Two times a day (BID) | ORAL | Status: DC
  Administered 2020-09-28 – 2020-10-01 (×6): 25 mg via ORAL
  Filled 2020-09-28 (×6): qty 1

## 2020-09-28 MED ORDER — ALBUTEROL SULFATE (2.5 MG/3ML) 0.083% IN NEBU
2.5000 mg | INHALATION_SOLUTION | RESPIRATORY_TRACT | Status: DC | PRN
  Administered 2020-09-28 – 2020-09-30 (×6): 2.5 mg via RESPIRATORY_TRACT
  Filled 2020-09-28 (×7): qty 3

## 2020-09-28 MED ORDER — AZELASTINE HCL 0.1 % NA SOLN
1.0000 | Freq: Two times a day (BID) | NASAL | Status: DC
  Filled 2020-09-28: qty 30

## 2020-09-28 MED ORDER — LEVETIRACETAM 500 MG PO TABS
500.0000 mg | ORAL_TABLET | Freq: Two times a day (BID) | ORAL | Status: DC
  Administered 2020-09-28 – 2020-10-01 (×6): 500 mg via ORAL
  Filled 2020-09-28 (×6): qty 1

## 2020-09-28 MED ORDER — REVEFENACIN 175 MCG/3ML IN SOLN
175.0000 ug | Freq: Every day | RESPIRATORY_TRACT | Status: DC
  Administered 2020-09-28 – 2020-10-01 (×4): 175 ug via RESPIRATORY_TRACT
  Filled 2020-09-28 (×5): qty 3

## 2020-09-28 MED ORDER — TRAMADOL HCL 50 MG PO TABS
50.0000 mg | ORAL_TABLET | Freq: Four times a day (QID) | ORAL | Status: DC | PRN
  Administered 2020-09-28 – 2020-09-30 (×3): 50 mg via ORAL
  Filled 2020-09-28 (×3): qty 1

## 2020-09-28 MED ORDER — LORATADINE 10 MG PO TABS
10.0000 mg | ORAL_TABLET | Freq: Every day | ORAL | Status: DC
  Administered 2020-09-29 – 2020-10-01 (×3): 10 mg via ORAL
  Filled 2020-09-28 (×3): qty 1

## 2020-09-28 NOTE — Progress Notes (Signed)
PROGRESS NOTE    Elizabeth Bautista  MWN:[id number] DOB: 1976-04-18 DOA: 09/27/2020 PCP: Anabel Halon, MD    Brief Narrative:  45 year old female with a history of severe persistent asthma, admitted with acute asthma exacerbation.  She is on IV steroids.  She is slowly improving.   Assessment & Plan:   Active Problems:   Depression   Class 3 severe obesity with serious comorbidity and body mass index (BMI) of 45.0 to 49.9 in adult [facility])   Essential hypertension   GERD (gastroesophageal reflux disease)   History of seizures   Asthma exacerbation   Severe asthma with exacerbation   Acute asthma exacerbation/severe persistent asthma with frequent exacerbations -Started on intravenous steroids -Continue inhaled steroids, bronchodilators -Continue on Singulair -She was supposed to follow-up with her allergist, Dr. Dellis Anes and start on [place], but unfortunate was not able to follow-up as of yet -Appreciate pulmonology input -We will continue current treatments through the weekend -We will likely need to discharge on prolonged steroid taper and continue on prednisone 10 mg daily until she can follow-up with her allergist.  Obstructive sleep apnea -Continue on CPAP nightly -She is hopeful to have her home CPAP machine delivered in the next several days  GERD -Continue Protonix and Pepcid  Rheumatoid arthritis -Chronically on Plaquenil  Seizure disorder -Continue on Keppra  Class III obesity -BMI 48 -Discussed the importance of diet and exercise   DVT prophylaxis: Lovenox  Code Status: Full code Family Communication: Discussed with patient Disposition Plan: Status is: Inpatient  Remains inpatient appropriate because:IV treatments appropriate due to intensity of illness or inability to take PO   Dispo: The patient is from: Home              Anticipated d/c is to: Home              Patient currently is not medically stable to d/c.   Difficult to place patient  No         Consultants:   Pulmonology  Procedures:     Antimicrobials:       Subjective: Feels that her breathing is improving.  Not quite returned to baseline yet.  Overall wheezing is better.  Objective: Vitals:   09/28/20 1400 09/28/20 1500 09/28/20 1635 09/28/20 1730  BP: (!) 144/72 140/87  (!) 152/86  Pulse: 67 84  82  Resp: (!) 33 20    Temp:   98 F (36.7 C) 98.3 F (36.8 C)  TempSrc:   Oral Oral  SpO2: 97% 99%  99%  Weight:      Height:        Intake/Output Summary (Last 24 hours) at 09/28/2020 1818 Last data filed at 09/28/2020 1300 Gross per 24 hour  Intake 480 ml  Output 1200 ml  Net -720 ml   Filed Weights   09/27/20 1201 09/27/20 1839  Weight: 132.5 kg 131 kg    Examination:  General exam: Appears calm and comfortable  Respiratory system: Mild wheeze bilaterally. Respiratory effort normal. Cardiovascular system: S1 & S2 heard, RRR. No JVD, murmurs, rubs, gallops or clicks. No pedal edema. Gastrointestinal system: Abdomen is nondistended, soft and nontender. No organomegaly or masses felt. Normal bowel sounds heard. Central nervous system: Alert and oriented. No focal neurological deficits. Extremities: Symmetric 5 x 5 power. Skin: No rashes, lesions or ulcers Psychiatry: Judgement and insight appear normal. Mood & affect appropriate.     Data Reviewed: I have personally reviewed following labs and imaging studies  CBC: Recent  Labs  Lab 09/27/20 1212 09/28/20 0448  WBC 7.8 14.6*  NEUTROABS 5.3  --   HGB 9.7* 9.8*  HCT 33.8* 34.3*  MCV 73.2* 73.0*  PLT 232 241   Basic Metabolic Panel: Recent Labs  Lab 09/27/20 1212 09/28/20 0448  NA 132* 136  K 4.0 4.2  CL 99 104  CO2 24 23  GLUCOSE 110* 249*  BUN 8 7  CREATININE 0.62 0.59  CALCIUM 9.4 8.8*   GFR: Estimated Creatinine Clearance: 120.1 mL/min (by C-G formula based on SCr of 0.59 mg/dL). Liver Function Tests: No results for input(s): AST, ALT, ALKPHOS, BILITOT,  PROT, ALBUMIN in the last 168 hours. No results for input(s): LIPASE, AMYLASE in the last 168 hours. No results for input(s): AMMONIA in the last 168 hours. Coagulation Profile: No results for input(s): INR, PROTIME in the last 168 hours. Cardiac Enzymes: No results for input(s): CKTOTAL, CKMB, CKMBINDEX, TROPONINI in the last 168 hours. BNP (last 3 results) No results for input(s): PROBNP in the last 8760 hours. HbA1C: Recent Labs    09/27/20 1242  HGBA1C 7.0*   CBG: Recent Labs  Lab 09/27/20 2138 09/28/20 0740 09/28/20 1217 09/28/20 1632  GLUCAP 302* 224* 198* 241*   Lipid Profile: No results for input(s): CHOL, HDL, LDLCALC, TRIG, CHOLHDL, LDLDIRECT in the last 72 hours. Thyroid Function Tests: No results for input(s): TSH, T4TOTAL, FREET4, T3FREE, THYROIDAB in the last 72 hours. Anemia Panel: No results for input(s): VITAMINB12, FOLATE, FERRITIN, TIBC, IRON, RETICCTPCT in the last 72 hours. Sepsis Labs: No results for input(s): PROCALCITON, LATICACIDVEN in the last 168 hours.  Recent Results (from the past 240 hour(s))  Resp Panel by RT-PCR (Flu A&B, Covid) Nasopharyngeal Swab     Status: None   Collection Time: 09/27/20 12:11 PM   Specimen: Nasopharyngeal Swab; Nasopharyngeal(NP) swabs in vial transport medium  Result Value Ref Range Status   SARS Coronavirus 2 by RT PCR NEGATIVE NEGATIVE Final    Comment: (NOTE) SARS-CoV-2 target nucleic acids are NOT DETECTED.  The SARS-CoV-2 RNA is generally detectable in upper respiratory specimens during the acute phase of infection. The lowest concentration of SARS-CoV-2 viral copies this assay can detect is 138 copies/mL. A negative result does not preclude SARS-Cov-2 infection and should not be used as the sole basis for treatment or other patient management decisions. A negative result may occur with  improper specimen collection/handling, submission of specimen other than nasopharyngeal swab, presence of viral  mutation(s) within the areas targeted by this assay, and inadequate number of viral copies(<138 copies/mL). A negative result must be combined with clinical observations, patient history, and epidemiological information. The expected result is Negative.  Fact Sheet for Patients:  BloggerCourse.com  Fact Sheet for Healthcare Providers:  SeriousBroker.it  This test is no t yet approved or cleared by the [place] FDA and  has been authorized for detection and/or diagnosis of SARS-CoV-2 by FDA under an Emergency Use Authorization (EUA). This EUA will remain  in effect (meaning this test can be used) for the duration of the COVID-19 declaration under Section 564(b)(1) of the Act, 21 U.S.C.section 360bbb-3(b)(1), unless the authorization is terminated  or revoked sooner.       Influenza A by PCR NEGATIVE NEGATIVE Final   Influenza B by PCR NEGATIVE NEGATIVE Final    Comment: (NOTE) The Xpert Xpress SARS-CoV-2/FLU/RSV plus assay is intended as an aid in the diagnosis of influenza from Nasopharyngeal swab specimens and should not be used as a sole basis for  treatment. Nasal washings and aspirates are unacceptable for Xpert Xpress SARS-CoV-2/FLU/RSV testing.  Fact Sheet for Patients: BloggerCourse.com  Fact Sheet for Healthcare Providers: SeriousBroker.it  This test is not yet approved or cleared by the [place] FDA and has been authorized for detection and/or diagnosis of SARS-CoV-2 by FDA under an Emergency Use Authorization (EUA). This EUA will remain in effect (meaning this test can be used) for the duration of the COVID-19 declaration under Section 564(b)(1) of the Act, 21 U.S.C. section 360bbb-3(b)(1), unless the authorization is terminated or revoked.  Performed at [facility], [address], [place], [place] [address]   MRSA PCR Screening     Status: None    Collection Time: 09/27/20  6:57 PM   Specimen: Nasal Mucosa; Nasopharyngeal  Result Value Ref Range Status   MRSA by PCR NEGATIVE NEGATIVE Final    Comment:        The GeneXpert MRSA Assay (FDA approved for NASAL specimens only), is one component of a comprehensive MRSA colonization surveillance program. It is not intended to diagnose MRSA infection nor to guide or monitor treatment for MRSA infections. Performed at [facility], [address] [place], [place] [address]          Radiology Studies: [facility] Chest [facility] 1 View  Result Date: 09/27/2020 CLINICAL DATA:  Shortness of breath EXAM: PORTABLE CHEST 1 VIEW COMPARISON:  Chest radiograph and chest CT May 10, 2020 FINDINGS: Lungs are clear. Heart is enlarged, stable, with pulmonary vascularity normal. No adenopathy. No bone lesions. IMPRESSION: Stable cardiomegaly.  No edema or airspace opacity. Electronically Signed   By: Bretta Bang III M.D.   On: 09/27/2020 12:48        Scheduled Meds: . amLODipine  5 mg Oral Daily  . arformoterol  15 mcg Nebulization BID  . azelastine  1 spray Each Nare BID  . budesonide  0.5 mg Nebulization BID  . busPIRone  7.5 mg Oral BID  . Chlorhexidine Gluconate Cloth  6 each Topical Daily  . cholecalciferol  1,000 Units Oral Daily  . enoxaparin (LOVENOX) injection  0.5 mg/kg Subcutaneous Q24H  . escitalopram  20 mg Oral QPM  . famotidine  20 mg Oral Daily  . famotidine  20 mg Oral BID  . ferrous sulfate  325 mg Oral BID  . fluticasone  1 spray Each Nare Daily  . furosemide  20 mg Oral Daily  . hydrochlorothiazide  25 mg Oral Daily  . hydroxychloroquine  200 mg Oral BID  . insulin aspart  0-15 Units Subcutaneous TID WC  . insulin aspart  0-5 Units Subcutaneous QHS  . levETIRAcetam  500 mg Oral BID  . loratadine  10 mg Oral Daily  . methylPREDNISolone (SOLU-MEDROL) injection  60 mg Intravenous Q6H  . mirtazapine  30 mg Oral QHS  . montelukast  10 mg Oral QHS  . pantoprazole  40  mg Oral BID WC  . prazosin  1 mg Oral QHS  . QUEtiapine  25 mg Oral BID  . revefenacin  175 mcg Nebulization Daily  . sodium chloride  2 spray Each Nare BID   Continuous Infusions:   LOS: 1 day    Time spent:    Erick Blinks, MD [facility]   If 7PM-7AM, please contact night-coverage [email or web address]  09/28/2020, 6:18 PM

## 2020-09-28 NOTE — Progress Notes (Signed)
Inpatient Diabetes Program Recommendations  AACE/ADA: New Consensus Statement on Inpatient Glycemic Control   Target Ranges:  Prepandial:   less than 140 mg/dL      Peak postprandial:   less than 180 mg/dL (1-2 hours)      Critically ill patients:  140 - 180 mg/dL  Results for KONNER, WARRIOR (MRN [id number]) as of 09/28/2020 09:20  Ref. Range 09/27/2020 21:38 09/28/2020 07:40  Glucose-Capillary Latest Ref Range: 70 - 99 mg/dL 381 (H) 017 (H)  Results for ERIC, MORGANTI (MRN [id number]) as of 09/28/2020 09:20  Ref. Range 09/27/2020 12:12  Glucose Latest Ref Range: 70 - 99 mg/dL 782 (H)   Results for DARNISE, MONTAG (MRN [id number]) as of 09/28/2020 09:20  Ref. Range 01/18/2020 15:00 09/27/2020 12:42  Hemoglobin A1C Latest Ref Range: 4.8 - 5.6 % 6.9 (H) 7.0 (H)   Review of Glycemic Control  Diabetes history: DM2 Outpatient Diabetes medications: None Current orders for Inpatient glycemic control: Novolog 0-15 units TID with meals, Novolog 0-5 units QHS; Solumedrol 60 mg Q6H  Inpatient Diabetes Program Recommendations:    Insulin: If steroids are continued as ordered, please consider ordering Levemir 10 units Q24H. If post prandial glucose is consistently over 180 mg/dl, may want to consider ordering Novolog 3 units TID with meals for meal coverage if patient eats at least 50% of meals.  Thanks, Orlando Penner, RN, MSN, CDE Diabetes Coordinator Inpatient Diabetes Program [phone] (Team Pager from 8am to 5pm)

## 2020-09-28 NOTE — TOC Initial Note (Signed)
Transition of Care [facility]) - Initial/Assessment Note    Patient Details  Name: Elizabeth Bautista MRN: [id number] Date of Birth: 03/25/1976  Transition of Care [facility]) CM/SW Contact:    Karn Cassis, LCSW Phone Number: 09/28/2020, 11:24 AM  Clinical Narrative:  Pt admitted due to asthma exacerbation. Assessment completed due to high risk readmission score. She reports she lives with her 3 adult children who assist with ADLs. Pt ambulates with walker. She uses [employer] for transportation. Pt indicates she plans to return home when medically stable. TOC will continue to follow.                  Expected Discharge Plan: Home/Self Care Barriers to Discharge: Continued Medical Work up   Patient Goals and CMS Choice Patient states their goals for this hospitalization and ongoing recovery are:: return home   Choice offered to / list presented to : Patient  Expected Discharge Plan and Services Expected Discharge Plan: Home/Self Care In-house Referral: Clinical Social Work     Living arrangements for the past 2 months: Single Family Home                 DME Arranged: N/A                    Prior Living Arrangements/Services Living arrangements for the past 2 months: Single Family Home Lives with:: Adult Children Patient language and need for interpreter reviewed:: Yes Do you feel safe going back to the place where you live?: Yes      Need for Family Participation in Patient Care: Yes (Comment) Care giver support system in place?: Yes (comment) Current home services: DME (walker, cane, BSC, shower chair) Criminal Activity/Legal Involvement Pertinent to Current Situation/Hospitalization: No - Comment as needed  Activities of Daily Living Home Assistive Devices/Equipment: Walker (specify type) ADL Screening (condition at time of admission) Patient's cognitive ability adequate to safely complete daily activities?: Yes Is the patient deaf or have difficulty hearing?: No Does  the patient have difficulty seeing, even when wearing glasses/contacts?: No Does the patient have difficulty concentrating, remembering, or making decisions?: No Patient able to express need for assistance with ADLs?: No Does the patient have difficulty dressing or bathing?: No Independently performs ADLs?: Yes (appropriate for developmental age) Does the patient have difficulty walking or climbing stairs?: Yes Weakness of Legs: None Weakness of Arms/Hands: None  Permission Sought/Granted                  Emotional Assessment   Attitude/Demeanor/Rapport: Engaged Affect (typically observed): Accepting Orientation: : Oriented to Place,Oriented to Self,Oriented to  Time,Oriented to Situation Alcohol / Substance Use: Not Applicable Psych Involvement: No (comment)  Admission diagnosis:  Asthma exacerbation [J45.901] Severe asthma with exacerbation [J45.901] Severe asthma with exacerbation, unspecified whether persistent [J45.901] Patient Active Problem List   Diagnosis Date Noted  . Asthma exacerbation 09/27/2020  . Severe asthma with exacerbation 09/27/2020  . Acute hip pain, left 09/19/2020  . Sciatica 09/11/2020  . Encounter for screening fecal occult blood testing 06/04/2020  . Encounter for gynecological examination with Papanicolaou smear of cervix 06/04/2020  . Screening examination for STD (sexually transmitted disease) 06/04/2020  . Dysmenorrhea 06/04/2020  . Menorrhagia with irregular cycle 06/04/2020  . Anxiety and depression 06/04/2020  . Chronic low back pain 05/24/2020  . Physical deconditioning 05/24/2020  . Asthma, moderate persistent 03/08/2020  . GERD (gastroesophageal reflux disease) 03/07/2020  . Anxiety 03/07/2020  . History of seizures 03/07/2020  .  Upper airway cough syndrome 02/10/2020  . Swelling of lower extremity 02/01/2020  . Essential hypertension 02/01/2020  . Vitamin D deficiency 02/01/2020  . Type 2 diabetes mellitus with hyperglycemia,  without long-term current use of insulin (HCC) 02/01/2020  . Anemia 02/01/2020  . Class 3 severe obesity with serious comorbidity and body mass index (BMI) of 45.0 to 49.9 in adult [facility]) 10/22/2019  . OSA on CPAP 10/18/2019  . Depression 10/18/2019  . Rheumatoid arthritis (HCC) 10/18/2019   PCP:  Anabel Halon, MD Pharmacy:   [facility] [phone] - [place], [place] - [address] AT [facility] [address] [place] [address] Phone: [phone] Fax: [phone]     Social Determinants of Health (SDOH) Interventions    Readmission Risk Interventions Readmission Risk Prevention Plan 09/28/2020 03/12/2020  Transportation Screening Complete Complete  HRI or Home Care Consult Complete Complete  Social Work Consult for Recovery Care Planning/Counseling Complete Complete  Palliative Care Screening Not Applicable Not Applicable  Medication Review [employer]) Complete Complete  Some recent data might be hidden

## 2020-09-28 NOTE — Consult Note (Signed)
Pulmonary and Critical Care Medicine   Patient name: Elizabeth Bautista Admit date: 09/27/2020  DOB: November 15, 1975 LOS: 1  MRN: [id number] Consult date: 09/28/2020  Referring provider: Dr. Roderic Palau, [facility]: Short of breath    History:  45 yo female former smoker presented with cough, wheeze, dyspnea, sinus congestion from asthma exacerbation.  She is followed by pulmonary and asthma/immunology for severe persistent allergic asthma with frequent exacerbations.  She was to start on nucala on 09/26/20.  Past medical history:  Asthma, Anemia, Seizures, DM type 2, HTN, GERD, RA, COVID 19 infection November 2021  Significant events:  4/29 Admit  Studies:   Spirometry 08/18/17 >> FEV1 2.03 (82%), FEV1% 73  CBC 05/10/20 >> eosinophils 0.4 K/uL  Allergy test 05/30/20 >> rye/bermuda/johnson grass, mold, hickory, dog, cat, cockroach  A1AT 07/09/20 >> 147  RAST 07/09/20 >> dust mites, cats, dogs, cockroach, pecan hickory; IgE 884  CT sinus 10/20/19 >> normal  CT angio chest 05/10/20 >> airway thickening, faint patchy GGO in lunges  PSG 05/23/20 >>AHI 6.7, SpO2 low 83%. REM AHI 32.4.  Micro:  COVID/flu 4/28 >> negative MRSA PCR 4/28 >> negative  Lines:     Antibiotics:    Consults:      Interim history:    Vital signs:  BP 133/74   Pulse 75   Temp 98.1 F (36.7 C) (Oral)   Resp 12   Ht 5' 5"  (1.651 m)   Wt 131 kg   SpO2 99%   BMI 48.06 kg/m   Intake/output:  I/O last 3 completed shifts: In: 37.1 [IV Piggyback:37.1] Out: 1200 [Urine:1200]   Physical exam:   General - alert Eyes - pupils reactive ENT - no sinus tenderness, clear nasal drainage, raspy voice Cardiac - regular rate/rhythm, no murmur Chest - decreased breath sounds b/l, faint b/l expiratory wheezing Abdomen - soft, non tender, + bowel sounds Extremities - no cyanosis, clubbing, or edema Skin - no rashes Neuro - normal strength, moves extremities, follows commands Psych -  normal mood and behavior  Best practice:   DVT - lovenox SUP - protonix Nutrition - heart healthy Mobility - OOB to chair   Assessment/plan:   Acute asthma exacerbation. Severe persistent asthma with frequent exacerbations. - continue steroid taper >> likely will need to keep her on 5 to 10 mg chronically until she can follow up with Dr. Ernst Bowler to start on nucala - pulmicort, yupelri, brovana - prn albuterol  Obstructive sleep apnea. - continue CPAP qhs; she is waiting to get CPAP set up as outpt  Upper airway cough with allergic rhinitis. - continue flonase, singulair, astelin, nasal irrigation, claritin  Laryngopharyngeal reflux. - continue protonix bid, pepcid  History of rheumatoid arthritis. - on plaquenil - her PCP was arranging for outpt rheumatology assessment  Suspect she will need to stay in hospital on IV steroids through the weekend.  PCCM will follow up on 10/01/20.  D/w Dr. Roderic Palau  Resolved hospital problems:    Goals of care/Family discussions:  Code status: full code  Labs:   CMP Latest Ref Rng & Units 09/28/2020 09/27/2020 06/19/2020  Glucose 70 - 99 mg/dL 249(H) 110(H) -  BUN 6 - 20 mg/dL 7 8 -  Creatinine 0.44 - 1.00 mg/dL 0.59 0.62 0.50  Sodium 135 - 145 mmol/L 136 132(L) -  Potassium 3.5 - 5.1 mmol/L 4.2 4.0 -  Chloride 98 - 111 mmol/L 104 99 -  CO2 22 - 32 mmol/L 23 24 -  Calcium 8.9 - 10.3  mg/dL 8.8(L) 9.4 -  Total Protein 6.5 - 8.1 g/dL - - -  Total Bilirubin 0.3 - 1.2 mg/dL - - -  Alkaline Phos 38 - 126 U/L - - -  AST 15 - 41 U/L - - -  ALT 0 - 44 U/L - - -    CBC Latest Ref Rng & Units 09/28/2020 09/27/2020 05/10/2020  WBC 4.0 - 10.5 K/uL 14.6(H) 7.8 10.3  Hemoglobin 12.0 - 15.0 g/dL 9.8(L) 9.7(L) 10.8(L)  Hematocrit 36.0 - 46.0 % 34.3(L) 33.8(L) 36.4  Platelets 150 - 400 K/uL 241 232 220    ABG    Component Value Date/Time   PHART 7.336 (L) 10/18/2019 2100   PCO2ART 28.7 (L) 10/18/2019 2100   PO2ART 165 (H) 10/18/2019 2100    HCO3 17.1 (L) 10/18/2019 2100   ACIDBASEDEF 9.8 (H) 10/18/2019 2100   O2SAT 99.1 10/18/2019 2100    CBG (last 3)  Recent Labs    09/27/20 2138 09/28/20 0740 09/28/20 1217  GLUCAP 302* 224* 198*     Past surgical history:  She  has a past surgical history that includes Appendectomy and Cesarean section.  Social history:  She  reports that she quit smoking about 6 years ago. Her smoking use included cigarettes. She quit after 10.00 years of use. She has never used smokeless tobacco. She reports previous alcohol use. She reports previous drug use.   Review of systems:  Reviewed and negative  Family history:  Her family history includes Angioedema in her daughter; Asthma in her child, child, daughter, maternal aunt, and son; Breast cancer in her cousin and cousin; Cervical cancer in her maternal aunt; Dementia in her maternal aunt; Diabetes in her brother, father, paternal aunt, paternal grandmother, sister, and another family member; Heart Problems in her maternal grandmother and sister; Heart attack in an other family member; High blood pressure in her mother and another family member; Seizures in her cousin, cousin, and cousin.    Medications:   No current facility-administered medications on file prior to encounter.   Current Outpatient Medications on File Prior to Encounter  Medication Sig  . albuterol (PROAIR HFA) 108 (90 Base) MCG/ACT inhaler 2 puffs every 4 hours as needed only  if your can't catch your breath  . albuterol (PROVENTIL) (2.5 MG/3ML) 0.083% nebulizer solution Take 3 mLs (2.5 mg total) by nebulization every 6 (six) hours as needed for wheezing or shortness of breath.  Marland Kitchen amLODipine (NORVASC) 5 MG tablet Take 1 tablet (5 mg total) by mouth daily.  . blood glucose meter kit and supplies Dispense based on patient and insurance preference. Use up to four times daily as directed. (FOR ICD-10 E10.9, E11.9).  Marland Kitchen Blood Pressure Monitoring (SPHYGMOMANOMETER) MISC 1 each by  Does not apply route daily.  . budesonide (PULMICORT) 0.5 MG/2ML nebulizer solution Take 2 mLs (0.5 mg total) by nebulization 2 (two) times daily.  . busPIRone (BUSPAR) 7.5 MG tablet Take 7.5 mg by mouth 2 (two) times daily.  . cetirizine (ZYRTEC) 10 MG tablet Take 1 tablet (10 mg total) by mouth daily.  . Cholecalciferol 1.25 MG (50000 UT) TABS Take 1 tablet by mouth daily.  . cyclobenzaprine (FLEXERIL) 10 MG tablet Take 10 mg by mouth 3 (three) times daily as needed for muscle spasms.   Marland Kitchen EPINEPHrine 0.3 mg/0.3 mL IJ SOAJ injection Inject 0.3 mg into the muscle as needed for anaphylaxis.  Marland Kitchen escitalopram (LEXAPRO) 20 MG tablet Take 1 tablet by mouth every evening.  . famotidine (PEPCID)  20 MG tablet Take 20 mg by mouth daily.  . ferrous sulfate 325 (65 FE) MG tablet Take 325 mg by mouth 2 (two) times daily.  . fluticasone (CUTIVATE) 0.005 % ointment Apply 1 application topically daily.  . fluticasone (FLONASE) 50 MCG/ACT nasal spray Place 1 spray into both nostrils daily.  . furosemide (LASIX) 20 MG tablet Take 20 mg by mouth daily.  . hydrochlorothiazide (HYDRODIURIL) 25 MG tablet Take 1 tablet (25 mg total) by mouth daily. For high blood pressure.  . hydroxychloroquine (PLAQUENIL) 200 MG tablet Take 1 tablet (200 mg total) by mouth 2 (two) times daily.  . hydrOXYzine (ATARAX/VISTARIL) 25 MG tablet Take 25 mg by mouth 2 (two) times daily.  Marland Kitchen levETIRAcetam (KEPPRA) 500 MG tablet Take 1 tablet (500 mg total) by mouth 2 (two) times daily.  . mirtazapine (REMERON) 30 MG tablet Take 30 mg by mouth at bedtime.  . montelukast (SINGULAIR) 10 MG tablet Take 1 tablet (10 mg total) by mouth at bedtime.  . pantoprazole (PROTONIX) 40 MG tablet Take 1 tablet (40 mg total) by mouth daily. Take 30-60 min before first meal of the day  . QUEtiapine (SEROQUEL) 25 MG tablet Take 25 mg by mouth 2 (two) times daily.   . traMADol (ULTRAM) 50 MG tablet Take 50 mg by mouth 2 (two) times daily.  Marland Kitchen triamcinolone  (KENALOG) 0.025 % ointment Apply 1 application topically 2 (two) times daily.  Marland Kitchen triamcinolone ointment (KENALOG) 0.5 % Apply 1 application topically as needed.  Marland Kitchen ibuprofen (ADVIL) 800 MG tablet Take 1 tablet (800 mg total) by mouth every 8 (eight) hours as needed.     Signature:  Chesley Mires, MD [place] Pager - [phone] 09/28/2020, 2:43 PM

## 2020-09-29 LAB — GLUCOSE, CAPILLARY
Glucose-Capillary: 168 mg/dL — ABNORMAL HIGH (ref 70–99)
Glucose-Capillary: 203 mg/dL — ABNORMAL HIGH (ref 70–99)
Glucose-Capillary: 169 mg/dL — ABNORMAL HIGH (ref 70–99)
Glucose-Capillary: 289 mg/dL — ABNORMAL HIGH (ref 70–99)

## 2020-09-29 NOTE — Progress Notes (Signed)
PROGRESS NOTE    Elizabeth Bautista  NWG:[id number] DOB: Mar 20, 1976 DOA: 09/27/2020 PCP: Anabel Halon, MD    Brief Narrative:  45 year old female with a history of severe persistent asthma, admitted with acute asthma exacerbation.  She is on IV steroids.  She is slowly improving.   Assessment & Plan:   Active Problems:   Depression   Class 3 severe obesity with serious comorbidity and body mass index (BMI) of 45.0 to 49.9 in adult [facility])   Essential hypertension   GERD (gastroesophageal reflux disease)   History of seizures   Asthma exacerbation   Severe asthma with exacerbation   Acute asthma exacerbation/severe persistent asthma with frequent exacerbations -Continues to feel short of breath and is wheezing -Continue on intravenous steroids -Continue inhaled steroids, bronchodilators -Continue on Singulair -She was supposed to follow-up with her allergist, Dr. Dellis Anes and start on [place], but unfortunate was not able to follow-up as of yet -Appreciate pulmonology input -We will continue current treatments through the weekend -We will likely need to discharge on prolonged steroid taper and continue on prednisone 10 mg daily until she can follow-up with her allergist.  Obstructive sleep apnea -Continue on CPAP nightly -She is hopeful to have her home CPAP machine delivered in the next several days  GERD -Continue Protonix and Pepcid  Rheumatoid arthritis -Chronically on Plaquenil  Seizure disorder -Continue on Keppra  Class III obesity -BMI 48 -Discussed the importance of diet and exercise   DVT prophylaxis: Lovenox  Code Status: Full code Family Communication: Discussed with patient Disposition Plan: Status is: Inpatient  Remains inpatient appropriate because:IV treatments appropriate due to intensity of illness or inability to take PO   Dispo: The patient is from: Home              Anticipated d/c is to: Home              Patient currently is not medically  stable to d/c.   Difficult to place patient No    Consultants:   Pulmonology  Procedures:     Antimicrobials:       Subjective: Feels more short of breath today and feels that wheezing is worse today.  Objective: Vitals:   09/29/20 0839 09/29/20 1353 09/29/20 1430 09/29/20 1816  BP:   137/76   Pulse:   79   Resp:   20   Temp:   98.2 F (36.8 C)   TempSrc:   Oral   SpO2: 98% 95% 98% 97%  Weight:      Height:        Intake/Output Summary (Last 24 hours) at 09/29/2020 1902 Last data filed at 09/29/2020 1300 Gross per 24 hour  Intake 200 ml  Output --  Net 200 ml   Filed Weights   09/27/20 1201 09/27/20 1839  Weight: 132.5 kg 131 kg    Examination:  General exam: Alert, awake, oriented x 3 Respiratory system: bilateral audible wheezing. Respiratory effort normal. Cardiovascular system:RRR. No murmurs, rubs, gallops. Gastrointestinal system: Abdomen is nondistended, soft and nontender. No organomegaly or masses felt. Normal bowel sounds heard. Central nervous system: Alert and oriented. No focal neurological deficits. Extremities: No C/C/E, +pedal pulses Skin: No rashes, lesions or ulcers Psychiatry: Judgement and insight appear normal. Mood & affect appropriate.     Data Reviewed: I have personally reviewed following labs and imaging studies  CBC: Recent Labs  Lab 09/27/20 1212 09/28/20 0448  WBC 7.8 14.6*  NEUTROABS 5.3  --   HGB 9.7*  9.8*  HCT 33.8* 34.3*  MCV 73.2* 73.0*  PLT 232 241   Basic Metabolic Panel: Recent Labs  Lab 09/27/20 1212 09/28/20 0448  NA 132* 136  K 4.0 4.2  CL 99 104  CO2 24 23  GLUCOSE 110* 249*  BUN 8 7  CREATININE 0.62 0.59  CALCIUM 9.4 8.8*   GFR: Estimated Creatinine Clearance: 120.1 mL/min (by C-G formula based on SCr of 0.59 mg/dL). Liver Function Tests: No results for input(s): AST, ALT, ALKPHOS, BILITOT, PROT, ALBUMIN in the last 168 hours. No results for input(s): LIPASE, AMYLASE in the last 168  hours. No results for input(s): AMMONIA in the last 168 hours. Coagulation Profile: No results for input(s): INR, PROTIME in the last 168 hours. Cardiac Enzymes: No results for input(s): CKTOTAL, CKMB, CKMBINDEX, TROPONINI in the last 168 hours. BNP (last 3 results) No results for input(s): PROBNP in the last 8760 hours. HbA1C: Recent Labs    09/27/20 1242  HGBA1C 7.0*   CBG: Recent Labs  Lab 09/28/20 1632 09/28/20 2149 09/29/20 0746 09/29/20 1115 09/29/20 1637  GLUCAP 241* 261* 168* 203* 169*   Lipid Profile: No results for input(s): CHOL, HDL, LDLCALC, TRIG, CHOLHDL, LDLDIRECT in the last 72 hours. Thyroid Function Tests: No results for input(s): TSH, T4TOTAL, FREET4, T3FREE, THYROIDAB in the last 72 hours. Anemia Panel: No results for input(s): VITAMINB12, FOLATE, FERRITIN, TIBC, IRON, RETICCTPCT in the last 72 hours. Sepsis Labs: No results for input(s): PROCALCITON, LATICACIDVEN in the last 168 hours.  Recent Results (from the past 240 hour(s))  Resp Panel by RT-PCR (Flu A&B, Covid) Nasopharyngeal Swab     Status: None   Collection Time: 09/27/20 12:11 PM   Specimen: Nasopharyngeal Swab; Nasopharyngeal(NP) swabs in vial transport medium  Result Value Ref Range Status   SARS Coronavirus 2 by RT PCR NEGATIVE NEGATIVE Final    Comment: (NOTE) SARS-CoV-2 target nucleic acids are NOT DETECTED.  The SARS-CoV-2 RNA is generally detectable in upper respiratory specimens during the acute phase of infection. The lowest concentration of SARS-CoV-2 viral copies this assay can detect is 138 copies/mL. A negative result does not preclude SARS-Cov-2 infection and should not be used as the sole basis for treatment or other patient management decisions. A negative result may occur with  improper specimen collection/handling, submission of specimen other than nasopharyngeal swab, presence of viral mutation(s) within the areas targeted by this assay, and inadequate number of  viral copies(<138 copies/mL). A negative result must be combined with clinical observations, patient history, and epidemiological information. The expected result is Negative.  Fact Sheet for Patients:  BloggerCourse.com  Fact Sheet for Healthcare Providers:  SeriousBroker.it  This test is no t yet approved or cleared by the [place] FDA and  has been authorized for detection and/or diagnosis of SARS-CoV-2 by FDA under an Emergency Use Authorization (EUA). This EUA will remain  in effect (meaning this test can be used) for the duration of the COVID-19 declaration under Section 564(b)(1) of the Act, 21 U.S.C.section 360bbb-3(b)(1), unless the authorization is terminated  or revoked sooner.       Influenza A by PCR NEGATIVE NEGATIVE Final   Influenza B by PCR NEGATIVE NEGATIVE Final    Comment: (NOTE) The Xpert Xpress SARS-CoV-2/FLU/RSV plus assay is intended as an aid in the diagnosis of influenza from Nasopharyngeal swab specimens and should not be used as a sole basis for treatment. Nasal washings and aspirates are unacceptable for Xpert Xpress SARS-CoV-2/FLU/RSV testing.  Fact Sheet for Patients:  BloggerCourse.com  Fact Sheet for Healthcare Providers: SeriousBroker.it  This test is not yet approved or cleared by the [place] FDA and has been authorized for detection and/or diagnosis of SARS-CoV-2 by FDA under an Emergency Use Authorization (EUA). This EUA will remain in effect (meaning this test can be used) for the duration of the COVID-19 declaration under Section 564(b)(1) of the Act, 21 U.S.C. section 360bbb-3(b)(1), unless the authorization is terminated or revoked.  Performed at [facility], [address] [place], [place] [address]   MRSA PCR Screening     Status: None   Collection Time: 09/27/20  6:57 PM   Specimen: Nasal Mucosa; Nasopharyngeal   Result Value Ref Range Status   MRSA by PCR NEGATIVE NEGATIVE Final    Comment:        The GeneXpert MRSA Assay (FDA approved for NASAL specimens only), is one component of a comprehensive MRSA colonization surveillance program. It is not intended to diagnose MRSA infection nor to guide or monitor treatment for MRSA infections. Performed at [facility], [address], [place], [place] [address]          Radiology Studies: No results found.      Scheduled Meds: . amLODipine  5 mg Oral Daily  . arformoterol  15 mcg Nebulization BID  . azelastine  1 spray Each Nare BID  . budesonide  0.5 mg Nebulization BID  . busPIRone  7.5 mg Oral BID  . Chlorhexidine Gluconate Cloth  6 each Topical Daily  . cholecalciferol  1,000 Units Oral Daily  . enoxaparin (LOVENOX) injection  0.5 mg/kg Subcutaneous Q24H  . escitalopram  20 mg Oral QPM  . famotidine  20 mg Oral Daily  . famotidine  20 mg Oral BID  . ferrous sulfate  325 mg Oral BID  . fluticasone  1 spray Each Nare Daily  . furosemide  20 mg Oral Daily  . hydrochlorothiazide  25 mg Oral Daily  . hydroxychloroquine  200 mg Oral BID  . insulin aspart  0-15 Units Subcutaneous TID WC  . insulin aspart  0-5 Units Subcutaneous QHS  . levETIRAcetam  500 mg Oral BID  . loratadine  10 mg Oral Daily  . methylPREDNISolone (SOLU-MEDROL) injection  60 mg Intravenous Q6H  . mirtazapine  30 mg Oral QHS  . montelukast  10 mg Oral QHS  . pantoprazole  40 mg Oral BID WC  . prazosin  1 mg Oral QHS  . QUEtiapine  25 mg Oral BID  . revefenacin  175 mcg Nebulization Daily  . sodium chloride  2 spray Each Nare BID   Continuous Infusions:   LOS: 2 days    Time spent:    Erick Blinks, MD [facility]   If 7PM-7AM, please contact night-coverage [email or web address]  09/29/2020, 7:02 PM

## 2020-09-30 LAB — GLUCOSE, CAPILLARY
Glucose-Capillary: 235 mg/dL — ABNORMAL HIGH (ref 70–99)
Glucose-Capillary: 221 mg/dL — ABNORMAL HIGH (ref 70–99)
Glucose-Capillary: 290 mg/dL — ABNORMAL HIGH (ref 70–99)
Glucose-Capillary: 372 mg/dL — ABNORMAL HIGH (ref 70–99)

## 2020-09-30 NOTE — Progress Notes (Signed)
PROGRESS NOTE    Elizabeth Bautista  PPI:[id number] DOB: Mar 12, 1976 DOA: 09/27/2020 PCP: Anabel Halon, MD    Brief Narrative:  45 year old female with a history of severe persistent asthma, admitted with acute asthma exacerbation.  She is on IV steroids.  She is slowly improving.   Assessment & Plan:   Active Problems:   Depression   Class 3 severe obesity with serious comorbidity and body mass index (BMI) of 45.0 to 49.9 in adult [facility])   Essential hypertension   GERD (gastroesophageal reflux disease)   History of seizures   Asthma exacerbation   Severe asthma with exacerbation   Acute asthma exacerbation/severe persistent asthma with frequent exacerbations -Continues to feel short of breath and is wheezing -Continue on intravenous steroids -Continue inhaled steroids, bronchodilators -Continue on Singulair -She was supposed to follow-up with her allergist, Dr. Dellis Anes and start on [place], but unfortunate was not able to follow-up as of yet -Appreciate pulmonology input -We will continue current treatments through the weekend -We will likely need to discharge on prolonged steroid taper and continue on prednisone 10 mg daily until she can follow-up with her allergist.  Obstructive sleep apnea -Continue on CPAP nightly -She is hopeful to have her home CPAP machine delivered in the next several days  GERD -Continue Protonix and Pepcid  Rheumatoid arthritis -Chronically on Plaquenil  Seizure disorder -Continue on Keppra  Class III obesity -BMI 48 -Discussed the importance of diet and exercise   DVT prophylaxis: Lovenox  Code Status: Full code Family Communication: Discussed with patient Disposition Plan: Status is: Inpatient  Remains inpatient appropriate because:IV treatments appropriate due to intensity of illness or inability to take PO   Dispo: The patient is from: Home              Anticipated d/c is to: Home              Patient currently is not medically  stable to d/c.   Difficult to place patient No    Consultants:   Pulmonology  Procedures:     Antimicrobials:       Subjective: Continues to complain of shortness of breath.  She is bringing up more sputum with her cough  Objective: Vitals:   09/30/20 0413 09/30/20 0808 09/30/20 1255 09/30/20 1604  BP: 130/73  135/83   Pulse: 73  70   Resp: 20  20   Temp: (!) 97.3 F (36.3 C)  98.4 F (36.9 C)   TempSrc:   Oral   SpO2: 97% 97% 98% 98%  Weight:      Height:        Intake/Output Summary (Last 24 hours) at 09/30/2020 1822 Last data filed at 09/30/2020 1700 Gross per 24 hour  Intake 400 ml  Output --  Net 400 ml   Filed Weights   09/27/20 1201 09/27/20 1839  Weight: 132.5 kg 131 kg    Examination:  General exam: Alert, awake, oriented x 3 Respiratory system: coarse breath sounds at bases, increased resp effort. Cardiovascular system:RRR. No murmurs, rubs, gallops. Gastrointestinal system: Abdomen is nondistended, soft and nontender. No organomegaly or masses felt. Normal bowel sounds heard. Central nervous system: Alert and oriented. No focal neurological deficits. Extremities: No C/C/E, +pedal pulses Skin: No rashes, lesions or ulcers Psychiatry: Judgement and insight appear normal. Mood & affect appropriate.     Data Reviewed: I have personally reviewed following labs and imaging studies  CBC: Recent Labs  Lab 09/27/20 1212 09/28/20 0448  WBC 7.8  14.6*  NEUTROABS 5.3  --   HGB 9.7* 9.8*  HCT 33.8* 34.3*  MCV 73.2* 73.0*  PLT 232 241   Basic Metabolic Panel: Recent Labs  Lab 09/27/20 1212 09/28/20 0448  NA 132* 136  K 4.0 4.2  CL 99 104  CO2 24 23  GLUCOSE 110* 249*  BUN 8 7  CREATININE 0.62 0.59  CALCIUM 9.4 8.8*   GFR: Estimated Creatinine Clearance: 120.1 mL/min (by C-G formula based on SCr of 0.59 mg/dL). Liver Function Tests: No results for input(s): AST, ALT, ALKPHOS, BILITOT, PROT, ALBUMIN in the last 168 hours. No results  for input(s): LIPASE, AMYLASE in the last 168 hours. No results for input(s): AMMONIA in the last 168 hours. Coagulation Profile: No results for input(s): INR, PROTIME in the last 168 hours. Cardiac Enzymes: No results for input(s): CKTOTAL, CKMB, CKMBINDEX, TROPONINI in the last 168 hours. BNP (last 3 results) No results for input(s): PROBNP in the last 8760 hours. HbA1C: No results for input(s): HGBA1C in the last 72 hours. CBG: Recent Labs  Lab 09/29/20 1637 09/29/20 2036 09/30/20 0742 09/30/20 1117 09/30/20 1641  GLUCAP 169* 289* 235* 221* 290*   Lipid Profile: No results for input(s): CHOL, HDL, LDLCALC, TRIG, CHOLHDL, LDLDIRECT in the last 72 hours. Thyroid Function Tests: No results for input(s): TSH, T4TOTAL, FREET4, T3FREE, THYROIDAB in the last 72 hours. Anemia Panel: No results for input(s): VITAMINB12, FOLATE, FERRITIN, TIBC, IRON, RETICCTPCT in the last 72 hours. Sepsis Labs: No results for input(s): PROCALCITON, LATICACIDVEN in the last 168 hours.  Recent Results (from the past 240 hour(s))  Resp Panel by RT-PCR (Flu A&B, Covid) Nasopharyngeal Swab     Status: None   Collection Time: 09/27/20 12:11 PM   Specimen: Nasopharyngeal Swab; Nasopharyngeal(NP) swabs in vial transport medium  Result Value Ref Range Status   SARS Coronavirus 2 by RT PCR NEGATIVE NEGATIVE Final    Comment: (NOTE) SARS-CoV-2 target nucleic acids are NOT DETECTED.  The SARS-CoV-2 RNA is generally detectable in upper respiratory specimens during the acute phase of infection. The lowest concentration of SARS-CoV-2 viral copies this assay can detect is 138 copies/mL. A negative result does not preclude SARS-Cov-2 infection and should not be used as the sole basis for treatment or other patient management decisions. A negative result may occur with  improper specimen collection/handling, submission of specimen other than nasopharyngeal swab, presence of viral mutation(s) within the areas  targeted by this assay, and inadequate number of viral copies(<138 copies/mL). A negative result must be combined with clinical observations, patient history, and epidemiological information. The expected result is Negative.  Fact Sheet for Patients:  BloggerCourse.com  Fact Sheet for Healthcare Providers:  SeriousBroker.it  This test is no t yet approved or cleared by the [place] FDA and  has been authorized for detection and/or diagnosis of SARS-CoV-2 by FDA under an Emergency Use Authorization (EUA). This EUA will remain  in effect (meaning this test can be used) for the duration of the COVID-19 declaration under Section 564(b)(1) of the Act, 21 U.S.C.section 360bbb-3(b)(1), unless the authorization is terminated  or revoked sooner.       Influenza A by PCR NEGATIVE NEGATIVE Final   Influenza B by PCR NEGATIVE NEGATIVE Final    Comment: (NOTE) The Xpert Xpress SARS-CoV-2/FLU/RSV plus assay is intended as an aid in the diagnosis of influenza from Nasopharyngeal swab specimens and should not be used as a sole basis for treatment. Nasal washings and aspirates are unacceptable for Xpert  Xpress SARS-CoV-2/FLU/RSV testing.  Fact Sheet for Patients: BloggerCourse.com  Fact Sheet for Healthcare Providers: SeriousBroker.it  This test is not yet approved or cleared by the [place] FDA and has been authorized for detection and/or diagnosis of SARS-CoV-2 by FDA under an Emergency Use Authorization (EUA). This EUA will remain in effect (meaning this test can be used) for the duration of the COVID-19 declaration under Section 564(b)(1) of the Act, 21 U.S.C. section 360bbb-3(b)(1), unless the authorization is terminated or revoked.  Performed at [facility], [address], [place], [place] [address]   MRSA PCR Screening     Status: None   Collection Time: 09/27/20  6:57 PM    Specimen: Nasal Mucosa; Nasopharyngeal  Result Value Ref Range Status   MRSA by PCR NEGATIVE NEGATIVE Final    Comment:        The GeneXpert MRSA Assay (FDA approved for NASAL specimens only), is one component of a comprehensive MRSA colonization surveillance program. It is not intended to diagnose MRSA infection nor to guide or monitor treatment for MRSA infections. Performed at [facility], [address], [place], [place] [address]          Radiology Studies: No results found.      Scheduled Meds: . amLODipine  5 mg Oral Daily  . arformoterol  15 mcg Nebulization BID  . azelastine  1 spray Each Nare BID  . budesonide  0.5 mg Nebulization BID  . busPIRone  7.5 mg Oral BID  . Chlorhexidine Gluconate Cloth  6 each Topical Daily  . cholecalciferol  1,000 Units Oral Daily  . enoxaparin (LOVENOX) injection  0.5 mg/kg Subcutaneous Q24H  . escitalopram  20 mg Oral QPM  . famotidine  20 mg Oral Daily  . famotidine  20 mg Oral BID  . ferrous sulfate  325 mg Oral BID  . fluticasone  1 spray Each Nare Daily  . furosemide  20 mg Oral Daily  . hydrochlorothiazide  25 mg Oral Daily  . hydroxychloroquine  200 mg Oral BID  . insulin aspart  0-15 Units Subcutaneous TID WC  . insulin aspart  0-5 Units Subcutaneous QHS  . levETIRAcetam  500 mg Oral BID  . loratadine  10 mg Oral Daily  . methylPREDNISolone (SOLU-MEDROL) injection  60 mg Intravenous Q6H  . mirtazapine  30 mg Oral QHS  . montelukast  10 mg Oral QHS  . pantoprazole  40 mg Oral BID WC  . prazosin  1 mg Oral QHS  . QUEtiapine  25 mg Oral BID  . revefenacin  175 mcg Nebulization Daily  . sodium chloride  2 spray Each Nare BID   Continuous Infusions:   LOS: 3 days    Time spent:    Erick Blinks, MD [facility]   If 7PM-7AM, please contact night-coverage [email or web address]  09/30/2020, 6:22 PM

## 2020-10-01 ENCOUNTER — Telehealth: Payer: Self-pay | Admitting: Pulmonary Disease

## 2020-10-01 ENCOUNTER — Telehealth: Payer: Self-pay | Admitting: *Deleted

## 2020-10-01 LAB — BASIC METABOLIC PANEL
Anion gap: 8 (ref 5–15)
BUN: 12 mg/dL (ref 6–20)
CO2: 26 mmol/L (ref 22–32)
Calcium: 8.6 mg/dL — ABNORMAL LOW (ref 8.9–10.3)
Chloride: 102 mmol/L (ref 98–111)
Creatinine, Ser: 0.56 mg/dL (ref 0.44–1.00)
GFR, Estimated: >60 mL/min (ref >60)
Glucose, Bld: 231 mg/dL — ABNORMAL HIGH (ref 70–99)
Potassium: 4.1 mmol/L (ref 3.5–5.1)
Sodium: 136 mmol/L (ref 135–145)

## 2020-10-01 LAB — GLUCOSE, CAPILLARY
Glucose-Capillary: 222 mg/dL — ABNORMAL HIGH (ref 70–99)
Glucose-Capillary: 270 mg/dL — ABNORMAL HIGH (ref 70–99)

## 2020-10-01 LAB — CBC
HCT: 31.7 % — ABNORMAL LOW (ref 36.0–46.0)
Hemoglobin: 9.1 g/dL — ABNORMAL LOW (ref 12.0–15.0)
MCH: 20.7 pg — ABNORMAL LOW (ref 26.0–34.0)
MCHC: 28.7 g/dL — ABNORMAL LOW (ref 30.0–36.0)
MCV: 72 fL — ABNORMAL LOW (ref 80.0–100.0)
Platelets: 260 10*3/uL (ref 150–400)
RBC: 4.4 MIL/uL (ref 3.87–5.11)
RDW: 19.1 % — ABNORMAL HIGH (ref 11.5–15.5)
WBC: 15 10*3/uL — ABNORMAL HIGH (ref 4.0–10.5)
nRBC: 0 % (ref 0.0–0.2)

## 2020-10-01 MED ORDER — MONTELUKAST SODIUM 10 MG PO TABS: 10.0000 mg | ORAL_TABLET | Freq: Every day | ORAL | 1 refills | Status: DC

## 2020-10-01 MED ORDER — REVEFENACIN 175 MCG/3ML IN SOLN: 175.0000 ug | Freq: Every day | RESPIRATORY_TRACT | 1 refills | Status: DC

## 2020-10-01 MED ORDER — METHYLPREDNISOLONE 4 MG PO TBPK: ORAL_TABLET | ORAL | 0 refills | Status: DC

## 2020-10-01 MED ORDER — GABAPENTIN 100 MG PO CAPS: 200.0000 mg | ORAL_CAPSULE | Freq: Three times a day (TID) | ORAL | 0 refills | Status: DC | PRN

## 2020-10-01 NOTE — Telephone Encounter (Signed)
PA has been submitted through [employer] [place] [employer] for EpiPen and is currently pending approval/denial.  

## 2020-10-01 NOTE — Plan of Care (Signed)

## 2020-10-01 NOTE — Discharge Summary (Signed)
Physician Discharge Summary [facility]    Patient: Elizabeth Bautista                   Admit date: 09/27/2020   DOB: [birth year]             Discharge date:10/01/2020/10:47 AM RDE:[id number]                          PCP: [name], MD  Disposition: HOME  Recommendations for Outpatient Follow-up:   Follow up: in 1 week -with PCP Follow-up with pulmonologist in 1 to 2 weeks    Discharge Condition: Stable   Code Status:   Code Status: Full Code  Diet recommendation: Regular healthy diet   Discharge Diagnoses:    Active Problems:   Depression   Class 3 severe obesity with serious comorbidity and body mass index (BMI) of 45.0 to 49.9 in adult [facility])   Essential hypertension   GERD (gastroesophageal reflux disease)   History of seizures   Asthma exacerbation   Severe asthma with exacerbation   History of Present Illness/ Hospital Course Kathleen Argue Summary:   45 year old female with a history of severe persistent asthma, admitted with acute asthma exacerbation.  She is on IV steroids.  She is slowly improving.   Acute asthma exacerbation/severe persistent asthma with frequent exacerbations -Continue to have audible wheezing, but states shortness of breath has improved -Continue on intravenous steroids... Switch to p.o. tapered Medrol Dosepak -Continue inhaled steroids, bronchodilators -Continue on Singulair -She was supposed to follow-up with her allergist, Dr. Ernst Bowler and start on Nucala, but unfortunate was not able to follow-up as of yet -Pulmonologist was consulted -We will likely need to discharge on  steroid taper --may need to be on continued on prednisone 10 mg daily until she can follow-up with her allergist.  Obstructive sleep apnea -Continue on CPAP nightly -She is hopeful to have her home CPAP machine delivered in the next several days  GERD -Continue Protonix and Pepcid  Rheumatoid arthritis -Chronically on Plaquenil  Seizure  disorder -Continue on Keppra  Class III obesity -BMI 48 -Discussed the importance of diet and exercise    Code Status: Full code Family Communication: Discussed with patient    Dispo: The patient is from: Home  Anticipated d/c is to: Home    Risk of unplanned readmission Score:     Discharge Instructions:   Discharge Instructions    AMB referral to pulmonary rehabilitation   Complete by: As directed    Please select a program: Respiratory Care Services   Respiratory Care Services Diagnosis: Dyspnea   After initial evaluation and assessments completed: Virtual Based Care may be provided alone or in conjunction with Pulmonary Rehab/Respiratory Care services based on patient barriers.: Yes   Activity as tolerated - No restrictions   Complete by: As directed    Diet - low sodium heart healthy   Complete by: As directed    Discharge instructions   Complete by: As directed    Continue oral prednisone taper down, continue to wear your mask at all times specially outside, Continue inhalers and nebulizer every 4-6 hours Up with pulmonologist in 1-2 weeks Avoid any stimulants to exacerbate your asthma, try to avoid natural -pollens, tobacco, smoke inhalation [place] tears with normal saline or lukewarm water daily or twice a day   Increase activity slowly   Complete by: As directed        Medication List  TAKE these medications   albuterol 108 (90 Base) MCG/ACT inhaler Commonly known as: ProAir HFA 2 puffs every 4 hours as needed only  if your can't catch your breath   albuterol (2.5 MG/3ML) 0.083% nebulizer solution Commonly known as: PROVENTIL Take 3 mLs (2.5 mg total) by nebulization every 6 (six) hours as needed for wheezing or shortness of breath.   amLODipine 5 MG tablet Commonly known as: NORVASC Take 1 tablet (5 mg total) by mouth daily.   blood glucose meter kit and supplies Dispense based on patient and insurance preference. Use up  to four times daily as directed. (FOR ICD-10 E10.9, E11.9).   budesonide 0.5 MG/2ML nebulizer solution Commonly known as: PULMICORT Take 2 mLs (0.5 mg total) by nebulization 2 (two) times daily.   busPIRone 7.5 MG tablet Commonly known as: BUSPAR Take 7.5 mg by mouth 2 (two) times daily.   cetirizine 10 MG tablet Commonly known as: ZYRTEC Take 1 tablet (10 mg total) by mouth daily.   Cholecalciferol 1.25 MG (50000 UT) Tabs Take 1 tablet by mouth daily.   cyclobenzaprine 10 MG tablet Commonly known as: FLEXERIL Take 10 mg by mouth 3 (three) times daily as needed for muscle spasms.   EPINEPHrine 0.3 mg/0.3 mL Soaj injection Commonly known as: EPI-PEN Inject 0.3 mg into the muscle as needed for anaphylaxis.   escitalopram 20 MG tablet Commonly known as: LEXAPRO Take 1 tablet by mouth every evening.   famotidine 20 MG tablet Commonly known as: PEPCID Take 20 mg by mouth daily.   ferrous sulfate 325 (65 FE) MG tablet Take 325 mg by mouth 2 (two) times daily.   fluticasone 0.005 % ointment Commonly known as: CUTIVATE Apply 1 application topically daily.   fluticasone 50 MCG/ACT nasal spray Commonly known as: FLONASE Place 1 spray into both nostrils daily.   furosemide 20 MG tablet Commonly known as: LASIX Take 20 mg by mouth daily.   gabapentin 100 MG capsule Commonly known as: Neurontin Take 2 capsules (200 mg total) by mouth 3 (three) times daily as needed (for left leg nerve pain). What changed:   when to take this  reasons to take this   hydrochlorothiazide 25 MG tablet Commonly known as: HYDRODIURIL Take 1 tablet (25 mg total) by mouth daily. For high blood pressure.   hydroxychloroquine 200 MG tablet Commonly known as: PLAQUENIL Take 1 tablet (200 mg total) by mouth 2 (two) times daily.   hydrOXYzine 25 MG tablet Commonly known as: ATARAX/VISTARIL Take 25 mg by mouth 2 (two) times daily.   ibuprofen 800 MG tablet Commonly known as: ADVIL Take 1  tablet (800 mg total) by mouth every 8 (eight) hours as needed.   levETIRAcetam 500 MG tablet Commonly known as: Keppra Take 1 tablet (500 mg total) by mouth 2 (two) times daily.   methylPREDNISolone 4 MG Tbpk tablet Commonly known as: MEDROL DOSEPAK Medrol Dosepak take as instructed   mirtazapine 30 MG tablet Commonly known as: REMERON Take 30 mg by mouth at bedtime.   montelukast 10 MG tablet Commonly known as: SINGULAIR Take 1 tablet (10 mg total) by mouth at bedtime.   pantoprazole 40 MG tablet Commonly known as: Protonix Take 1 tablet (40 mg total) by mouth daily. Take 30-60 min before first meal of the day   QUEtiapine 25 MG tablet Commonly known as: SEROQUEL Take 25 mg by mouth 2 (two) times daily.   revefenacin 175 MCG/3ML nebulizer solution Commonly known as: YUPELRI Take 3 mLs (175  mcg total) by nebulization daily. Start taking on: Oct 02, 2020   Sphygmomanometer Misc 1 each by Does not apply route daily.   traMADol 50 MG tablet Commonly known as: ULTRAM Take 50 mg by mouth 2 (two) times daily.   triamcinolone ointment 0.5 % Commonly known as: KENALOG Apply 1 application topically as needed.   triamcinolone 0.025 % ointment Commonly known as: KENALOG Apply 1 application topically 2 (two) times daily.       Allergies  Allergen Reactions  . Phenytoin Anaphylaxis    Other reaction(s): swelling and heart rate decreased  . Phenylbutazones      Procedures /Studies:   DG Lumbar Spine Complete  Result Date: 09/20/2020 Clinical:  Lower back pain, left sided sciatica. X-rays were done of the lumbar spine, five views. There is good alignment of the lumbar spine. Disc spaces are well maintained.  No fracture is noted. Bone quality is good. 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 Electronically Bradley, MD 4/21/202210:19 AM   DG Chest Port 1 View  Result Date: 09/27/2020 CLINICAL DATA:  Shortness of breath EXAM: PORTABLE CHEST 1  VIEW COMPARISON:  Chest radiograph and chest CT May 10, 2020 FINDINGS: Lungs are clear. Heart is enlarged, stable, with pulmonary vascularity normal. No adenopathy. No bone lesions. IMPRESSION: Stable cardiomegaly.  No edema or airspace opacity. Electronically Signed   By: Lowella Grip III M.D.   On: 09/27/2020 12:48    Subjective:   Patient was seen and examined 10/01/2020, 10:47 AM Patient stable today. No acute distress.  No issues overnight Stable for discharge.  Discharge Exam:    Vitals:   09/30/20 2253 10/01/20 0454 10/01/20 0700 10/01/20 0739  BP:  (!) 161/90 (!) 145/81   Pulse:  60 60   Resp:  20 20   Temp:  98.5 F (36.9 C) 97.8 F (36.6 C)   TempSrc:   Axillary   SpO2: 98% 98% 98% 98%  Weight:      Height:        General: Pt lying comfortably in bed & appears in no obvious distress. Cardiovascular: S1 & S2 heard, RRR, S1/S2 +. No murmurs, rubs, gallops or clicks. No JVD or pedal edema. Respiratory: Audibly wheezing and upper airways , otherwise clear positive breath sounds throughout lung fields, rhonchi or crackles. No increased work of breathing. Abdominal:  Non-distended, non-tender & soft. No organomegaly or masses appreciated. Normal bowel sounds heard. CNS: Alert and oriented. No focal deficits. Extremities: no edema, no cyanosis      The results of significant diagnostics from this hospitalization (including imaging, microbiology, ancillary and laboratory) are listed below for reference.      Microbiology:   Recent Results (from the past 240 hour(s))  Resp Panel by RT-PCR (Flu A&B, Covid) Nasopharyngeal Swab     Status: None   Collection Time: 09/27/20 12:11 PM   Specimen: Nasopharyngeal Swab; Nasopharyngeal(NP) swabs in vial transport medium  Result Value Ref Range Status   SARS Coronavirus 2 by RT PCR NEGATIVE NEGATIVE Final    Comment: (NOTE) SARS-CoV-2 target nucleic acids are NOT DETECTED.  The SARS-CoV-2 RNA is generally detectable in  upper respiratory specimens during the acute phase of infection. The lowest concentration of SARS-CoV-2 viral copies this assay can detect is 138 copies/mL. A negative result does not preclude SARS-Cov-2 infection and should not be used as the sole basis for treatment or other patient management decisions. A negative result may occur with  improper specimen collection/handling, submission  of specimen other than nasopharyngeal swab, presence of viral mutation(s) within the areas targeted by this assay, and inadequate number of viral copies(<138 copies/mL). A negative result must be combined with clinical observations, patient history, and epidemiological information. The expected result is Negative.  Fact Sheet for Patients:  EntrepreneurPulse.com.au  Fact Sheet for Healthcare Providers:  IncredibleEmployment.be  This test is no t yet approved or cleared by the [place] FDA and  has been authorized for detection and/or diagnosis of SARS-CoV-2 by FDA under an Emergency Use Authorization (EUA). This EUA will remain  in effect (meaning this test can be used) for the duration of the COVID-19 declaration under Section 564(b)(1) of the Act, 21 U.S.C.section 360bbb-3(b)(1), unless the authorization is terminated  or revoked sooner.       Influenza A by PCR NEGATIVE NEGATIVE Final   Influenza B by PCR NEGATIVE NEGATIVE Final    Comment: (NOTE) The Xpert Xpress SARS-CoV-2/FLU/RSV plus assay is intended as an aid in the diagnosis of influenza from Nasopharyngeal swab specimens and should not be used as a sole basis for treatment. Nasal washings and aspirates are unacceptable for Xpert Xpress SARS-CoV-2/FLU/RSV testing.  Fact Sheet for Patients: EntrepreneurPulse.com.au  Fact Sheet for Healthcare Providers: IncredibleEmployment.be  This test is not yet approved or cleared by the [place] FDA and has been  authorized for detection and/or diagnosis of SARS-CoV-2 by FDA under an Emergency Use Authorization (EUA). This EUA will remain in effect (meaning this test can be used) for the duration of the COVID-19 declaration under Section 564(b)(1) of the Act, 21 U.S.C. section 360bbb-3(b)(1), unless the authorization is terminated or revoked.  Performed at [facility], [address], [place], [place] [address]   MRSA PCR Screening     Status: None   Collection Time: 09/27/20  6:57 PM   Specimen: Nasal Mucosa; Nasopharyngeal  Result Value Ref Range Status   MRSA by PCR NEGATIVE NEGATIVE Final    Comment:        The GeneXpert MRSA Assay (FDA approved for NASAL specimens only), is one component of a comprehensive MRSA colonization surveillance program. It is not intended to diagnose MRSA infection nor to guide or monitor treatment for MRSA infections. Performed at [facility], [address] [place], [place] [address]      Labs:   CBC: Recent Labs  Lab 09/27/20 1212 09/28/20 0448 10/01/20 0444  WBC 7.8 14.6* 15.0*  NEUTROABS 5.3  --   --   HGB 9.7* 9.8* 9.1*  HCT 33.8* 34.3* 31.7*  MCV 73.2* 73.0* 72.0*  PLT 232 241 546   Basic Metabolic Panel: Recent Labs  Lab 09/27/20 1212 09/28/20 0448 10/01/20 0444  NA 132* 136 136  K 4.0 4.2 4.1  CL 99 104 102  CO2 _0 GLUCOSE 110* 249* 231*  BUN _1 CREATININE 0.62 0.59 0.56  CALCIUM 9.4 8.8* 8.6*   Liver Function Tests: No results for input(s): AST, ALT, ALKPHOS, BILITOT, PROT, ALBUMIN in the last 168 hours. BNP (last 3 results) Recent Labs    09/27/20 1212  BNP 23.0   Cardiac Enzymes: No results for input(s): CKTOTAL, CKMB, CKMBINDEX, TROPONINI in the last 168 hours. CBG: Recent Labs  Lab 09/30/20 0742 09/30/20 1117 09/30/20 1641 09/30/20 2152 10/01/20 0729  GLUCAP 235* 221* 290* 372* 222*   Hgb A1c No results for input(s): HGBA1C in the last 72 hours. Lipid Profile No results for input(s):  CHOL, HDL, LDLCALC, TRIG, CHOLHDL, LDLDIRECT in  the last 72 hours. Thyroid function studies No results for input(s): TSH, T4TOTAL, T3FREE, THYROIDAB in the last 72 hours.  Invalid input(s): FREET3 Anemia work up No results for input(s): VITAMINB12, FOLATE, FERRITIN, TIBC, IRON, RETICCTPCT in the last 72 hours. Urinalysis    Component Value Date/Time   COLORURINE YELLOW 10/19/2019 1549   APPEARANCEUR CLEAR 10/19/2019 1549   LABSPEC 1.030 10/19/2019 1549   PHURINE 5.0 10/19/2019 1549   GLUCOSEU 150 (A) 10/19/2019 1549   HGBUR NEGATIVE 10/19/2019 1549   BILIRUBINUR NEGATIVE 10/19/2019 1549   KETONESUR 5 (A) 10/19/2019 1549   PROTEINUR NEGATIVE 10/19/2019 1549   NITRITE NEGATIVE 10/19/2019 1549   LEUKOCYTESUR NEGATIVE 10/19/2019 1549         Time coordinating discharge: Over 45 minutes  SIGNED: Deatra James, MD, FACP, FHM. [facility],  Please use [email or web address] to Page If 7PM-7AM, please contact night-coverage [email or web address] [email or web address] 10/01/2020, 10:47 AM

## 2020-10-01 NOTE — Telephone Encounter (Signed)
Will sign this message off as this is a duplicate message.

## 2020-10-01 NOTE — Telephone Encounter (Signed)
I have attempted to call the pt back but the phone rings and it sounds like someone picks up but doesn't say anything x 2.

## 2020-10-01 NOTE — Progress Notes (Signed)
Inpatient Diabetes Program Recommendations  AACE/ADA: New Consensus Statement on Inpatient Glycemic Control   Target Ranges:  Prepandial:   less than 140 mg/dL      Peak postprandial:   less than 180 mg/dL (1-2 hours)      Critically ill patients:  140 - 180 mg/dL     Review of Glycemic Control Results for Elizabeth Bautista, Elizabeth Bautista (MRN [id number]) as of 10/01/2020 09:47  Ref. Range 09/30/2020 07:42 09/30/2020 11:17 09/30/2020 16:41 09/30/2020 21:52 10/01/2020 07:29  Glucose-Capillary Latest Ref Range: 70 - 99 mg/dL 409 (H) 811 (H) 914 (H) 372 (H) 222 (H)   Diabetes history: DM2 Outpatient Diabetes medications: None Current orders for Inpatient glycemic control: Novolog 0-15 units TID with meals, Novolog 0-5 units QHS; Solumedrol 60 mg Q6H  Inpatient Diabetes Program Recommendations:    -  consider ordering Levemir 10 units Q24H.   -  Consider Novolog 3 units TID with meals for meal coverage if patient eats at least 50% of meals.  Thanks, Christena Deem RN, MSN, BC-ADM Inpatient Diabetes Coordinator Team Pager [phone] (8a-5p)

## 2020-10-01 NOTE — TOC Transition Note (Signed)
Transition of Care [facility]) - CM/SW Discharge Note   Patient Details  Name: Elizabeth Bautista MRN: [id number] Date of Birth: 11/06/1975  Transition of [facility]) CM/SW Contact:  Leitha Bleak, RN Phone Number: 10/01/2020, 1:47 PM   Clinical Narrative:   Patient discharging, RN called TOC saying patient does not have transportation. TOC called [facility]. They care calling [employer]. Waiver signed and scanned in chart. ETA for cab at 1PM at front entrance.    Final next level of care: Home/Self Care Barriers to Discharge: Barriers Resolved   Patient Goals and CMS Choice Patient states their goals for this hospitalization and ongoing recovery are:: return home   Choice offered to / list presented to : Patient  Discharge Placement          Patient to be transferred to facility by: [facility]   Patient and family notified of of transfer: 10/01/20  Discharge Plan and Services In-house Referral: Clinical Social Work              DME Arranged: N/A     Readmission Risk Interventions Readmission Risk Prevention Plan 10/01/2020 09/28/2020 03/12/2020  Transportation Screening Complete Complete Complete  HRI or Home Care Consult Complete Complete Complete  Social Work Consult for Recovery Care Planning/Counseling Complete Complete Complete  Palliative Care Screening Not Applicable Not Applicable Not Applicable  Medication Review [employer]) Complete Complete Complete  Some recent data might be hidden

## 2020-10-01 NOTE — Telephone Encounter (Signed)
ATC LVMTCB x 1  

## 2020-10-02 MED ORDER — YUPELRI 175 MCG/3ML IN SOLN: 175.0000 ug | Freq: Every day | RESPIRATORY_TRACT | 0 refills | Status: DC

## 2020-10-02 NOTE — Telephone Encounter (Signed)
Called [facility] and was on hold x 6 min without speaking with anyone  [facility]

## 2020-10-02 NOTE — Telephone Encounter (Signed)
Spoke with the pt  She states [facility] will not fill the Yupelri  She thinks it may need PA I called [facility] [phone] The pharmacy is not open yet  [facility]

## 2020-10-02 NOTE — Telephone Encounter (Signed)
Spoke with the pt and notified pharm closed when I called and I will try again later today  I have left her 14 days worth of Yupelri samples up front for pick up in [place] while we work on this

## 2020-10-03 MED ORDER — YUPELRI 175 MCG/3ML IN SOLN: 175.0000 ug | Freq: Every day | RESPIRATORY_TRACT | 0 refills | Status: DC

## 2020-10-03 NOTE — Telephone Encounter (Signed)
Ov before yupelri runs out and bring all inhalers, solutions and drug formulary

## 2020-10-03 NOTE — Telephone Encounter (Signed)
Spoke with the pt and notified of response per MW. Appt schedule with [facility] for next available 11/05/20 and 2 more boxes of Yupelri given to last until next visit.

## 2020-10-03 NOTE — Telephone Encounter (Signed)
[facility]ed [facility] and spoke to tech and was advised the Yupelri isnt covered. Called [facility] at [phone] (interaction #: [id number]) and spoke to Revonda Standard, she states the below medications are covered/preferred: anoro  atrovent hfa bevespi  combivent  spiriva hh spiriva respimat stiolto Atrovent neb (generic)  Duoneb (generic)   Dr. Sherene Sires, please advise if to do a PA on Yupelri or change to a preferred. Thanks.

## 2020-10-04 NOTE — Telephone Encounter (Signed)
PA has been approved for EpiPen. Called patient and advised of approval. Patient verbalized understanding. PA form has been faxed to pharmacy, labeled, and placed in bulk scanning.

## 2020-10-05 ENCOUNTER — Ambulatory Visit: Payer: Medicaid Other

## 2020-10-05 ENCOUNTER — Ambulatory Visit (HOSPITAL_COMMUNITY): Admission: RE | Admit: 2020-10-05 | Payer: Medicaid Other | Source: Ambulatory Visit

## 2020-10-11 ENCOUNTER — Ambulatory Visit (INDEPENDENT_AMBULATORY_CARE_PROVIDER_SITE_OTHER): Payer: Medicaid Other | Admitting: Internal Medicine

## 2020-10-11 ENCOUNTER — Other Ambulatory Visit: Payer: Self-pay

## 2020-10-11 ENCOUNTER — Encounter: Payer: Self-pay | Admitting: Internal Medicine

## 2020-10-11 VITALS — BP 125/77 | HR 92 | Ht 64.0 in | Wt 294.0 lb

## 2020-10-11 DIAGNOSIS — R945 Abnormal results of liver function studies: Secondary | ICD-10-CM

## 2020-10-11 DIAGNOSIS — Z79899 Other long term (current) drug therapy: Secondary | ICD-10-CM | POA: Diagnosis not present

## 2020-10-11 DIAGNOSIS — M059 Rheumatoid arthritis with rheumatoid factor, unspecified: Secondary | ICD-10-CM

## 2020-10-11 DIAGNOSIS — R7989 Other specified abnormal findings of blood chemistry: Secondary | ICD-10-CM | POA: Insufficient documentation

## 2020-10-11 NOTE — Progress Notes (Signed)
Office Visit Note  Patient: Elizabeth Bautista             Date of Birth: Oct 07, 1975           MRN: [id number]             PCP: Anabel Halon, MD Referring: Anabel Halon, MD Visit Date: 10/11/2020   Subjective:  New Patient (Initial Visit) (Patient is transferring care from [place]. Patient is currently on PLQ for RA. Patient moved here 1 year ago and local PCP has been prescribing PLQ recently. Patient does not feel as if symptoms are well controlled. )   History of Present Illness: Elizabeth Bautista is a 45 y.o. female here for evaluation and management of rheumatoid arthritis. Symptoms started at least since 2017 with hand, wrist, and knee pain and swelling periodically and worsened over time. She did not seek medication evaluation for some time due to being very busy with work and symptoms progressively increased. She was hospitalized for severe pain and swelling especially in the left wrist and knee and workup at that time was more extensive and revealed positive RA serology suspected as the cause. She saw rheumatology since 2020 and started treatment initial with prednisone that did not control symptoms well but caused a lot of weight gain. She has had chronic steroid exposure due to refractory asthma symptoms over the years. She also started hydroxychloroquine that apparently helped symptoms reasonably well initially but has worsened. She moved form [place] a year ago due to safety circumstances and has some family in the area and is continuing HCQ treatment but overall having a lot of joint pain. Currently feels swelling and heat in the joints multiple days per week and walks with either a cane or walker for her left knee pain and instability depending on how badly it is acting up. She was recently hospitalized for asthma exacerbation on 4/28 and discharged 5/2.  Labs reviewed 10/2018 RF ~30, CCP >250 ANA neg  Imaging reviewed Chest xray 08/2020 Cardiomegaly no airspace disease  Lumbar spine xray  08/2020 No acute findings  Activities of Daily Living:  Patient reports morning stiffness for several hours.   Patient Reports nocturnal pain.  Difficulty dressing/grooming: Reports Difficulty climbing stairs: Reports Difficulty getting out of chair: Reports Difficulty using hands for taps, buttons, cutlery, and/or writing: Reports  Review of Systems  Constitutional: Positive for fatigue.  HENT: Positive for mouth dryness. Negative for mouth sores and nose dryness.   Eyes: Positive for pain and itching. Negative for visual disturbance and dryness.  Respiratory: Positive for cough, shortness of breath and difficulty breathing. Negative for hemoptysis.   Cardiovascular: Positive for chest pain, palpitations and swelling in legs/feet.  Gastrointestinal: Positive for abdominal pain. Negative for blood in stool, constipation and diarrhea.  Endocrine: Negative for increased urination.  Genitourinary: Negative for painful urination.  Musculoskeletal: Positive for arthralgias, joint pain, joint swelling, myalgias, muscle weakness, morning stiffness, muscle tenderness and myalgias.  Skin: Positive for color change, rash and redness.  Allergic/Immunologic: Negative for susceptible to infections.  Neurological: Positive for dizziness, numbness, headaches, memory loss and weakness.  Hematological: Positive for swollen glands.  Psychiatric/Behavioral: Positive for confusion and sleep disturbance.    PMFS History:  Patient Active Problem List   Diagnosis Date Noted  . Abnormal liver function test 10/11/2020  . High risk medication use 10/11/2020  . Asthma exacerbation 09/27/2020  . Severe asthma with exacerbation 09/27/2020  . Acute hip pain, left 09/19/2020  . Sciatica  09/11/2020  . Encounter for screening fecal occult blood testing 06/04/2020  . Encounter for gynecological examination with Papanicolaou smear of cervix 06/04/2020  . Screening examination for STD (sexually transmitted  disease) 06/04/2020  . Dysmenorrhea 06/04/2020  . Menorrhagia with irregular cycle 06/04/2020  . Anxiety and depression 06/04/2020  . Chronic low back pain 05/24/2020  . Physical deconditioning 05/24/2020  . Asthma, moderate persistent 03/08/2020  . GERD (gastroesophageal reflux disease) 03/07/2020  . Anxiety 03/07/2020  . History of seizures 03/07/2020  . Upper airway cough syndrome 02/10/2020  . Swelling of lower extremity 02/01/2020  . Essential hypertension 02/01/2020  . Vitamin D deficiency 02/01/2020  . Type 2 diabetes mellitus with hyperglycemia, without long-term current use of insulin (HCC) 02/01/2020  . Anemia 02/01/2020  . Class 3 severe obesity with serious comorbidity and body mass index (BMI) of 45.0 to 49.9 in adult [facility]) 10/22/2019  . OSA on CPAP 10/18/2019  . Depression 10/18/2019  . Seropositive rheumatoid arthritis (HCC) 10/18/2019    Past Medical History:  Diagnosis Date  . Anemia   . Asthma   . COVID-19 virus infection 05/16/2020   04/27/20 dx covid-19, not hospitalized  . Diabetes (HCC)    on Metformin  . Eczema   . Edema   . 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 HTN (hypertension)   . Rheumatoid arthritis (HCC)    on Plaquenil   . Seizures (HCC)    last seizure 2019  . Sleep apnea   . Sleep apnea     Family History  Problem Relation Age of Onset  . High blood pressure Mother   . Rheum arthritis Mother   . Diabetes Father   . Diabetes Sister   . Heart Problems Sister   . Diabetes Brother   . Diabetes Paternal Grandmother   . Diabetes Other        father's side "everybody died from Diabetes"  . High blood pressure Other        mother's side, multiple siblings with this   . Heart attack Other        family member on mother's side   . Diabetes Paternal Aunt   . Seizures Cousin        not sibings to the other cousins with seizures  . Breast cancer Cousin    . Seizures Cousin        not sibings to the other cousins with seizures  . Seizures Cousin        not sibings to the other cousins with seizures  . Cervical cancer Maternal Aunt   . Breast cancer Cousin   . Dementia Maternal Aunt   . Asthma Maternal Aunt   . Heart Problems Maternal Grandmother   . Diabetes Brother   . Asthma Daughter   . Angioedema Daughter   . Asthma Son    Past Surgical History:  Procedure Laterality Date  . APPENDECTOMY    . CESAREAN SECTION     x3   Social History   Social History Narrative   Divorced.Lives with 3 kids.Originally from [place].Came from [place] ,[place] 7 months ago.      02/27/2020   Right handed   Caffeine: none     There is no immunization history on file for this patient.   Objective: Vital Signs: BP 125/77 (BP Location: Right Arm, Patient Position: Sitting, Cuff Size: Large)   Pulse 92  Ht 5\' 4"  (1.626 m)   Wt 294 lb (133.4 kg)   BMI 50.46 kg/m    Physical Exam Constitutional:      Appearance: She is obese.  HENT:     Right Ear: External ear normal.     Left Ear: External ear normal.     Mouth/Throat:     Mouth: Mucous membranes are moist.     Pharynx: Oropharynx is clear.  Eyes:     Conjunctiva/sclera: Conjunctivae normal.  Cardiovascular:     Rate and Rhythm: Normal rate and regular rhythm.     Comments: Systolic heart murmur Pulmonary:     Effort: Pulmonary effort is normal.     Breath sounds: Normal breath sounds.  Skin:    General: Skin is warm and dry.     Comments: Irregular bordered flat hyperpigmented skin changes on left forearm extensor surface  Neurological:     General: No focal deficit present.     Mental Status: She is alert.  Psychiatric:        Mood and Affect: Mood normal.      Musculoskeletal Exam:  Shoulders full ROM no tenderness or swelling Elbows full ROM no tenderness or swelling Wrists full ROM left wrist swelling over dorsal aspect with tenderness to pressure right wrist  normal Fingers full ROM no tenderness or swelling Knees full ROM, bilateral patellofemoral crepitus, left knee tenderness to palpation over medial and lateral joint line and superior border of patella probable soft tissue swelling or thickening but no palpable fluid wave or patellar ballottement Ankles full ROM lateral tenderness with soft tissue fullness no ankle joint effusion MTPs full ROM no tenderness or swelling   CDAI Exam: CDAI Score: 11  Patient Global: 60 mm; Provider Global: 20 mm Swollen: 1 ; Tender: 2  Joint Exam 10/11/2020      Right  Left  Wrist     Swollen Tender  Knee      Tender     Investigation: No additional findings.  Imaging: DG Lumbar Spine Complete  Result Date: 09/20/2020 Clinical:  Lower back pain, left sided sciatica. X-rays were done of the lumbar spine, five views. There is good alignment of the lumbar spine. Disc spaces are well maintained.  No fracture is noted. Bone quality is good. 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 Electronically Signed Darreld [name], MD 4/21/202210:19 AM   DG Chest Port 1 View  Result Date: 09/27/2020 CLINICAL DATA:  Shortness of breath EXAM: PORTABLE CHEST 1 VIEW COMPARISON:  Chest radiograph and chest CT May 10, 2020 FINDINGS: Lungs are clear. Heart is enlarged, stable, with pulmonary vascularity normal. No adenopathy. No bone lesions. IMPRESSION: Stable cardiomegaly.  No edema or airspace opacity. Electronically Signed   By: Bretta Bang III M.D.   On: 09/27/2020 12:48    Recent Labs: Lab Results  Component Value Date   WBC 15.0 (H) 10/01/2020   HGB 9.1 (L) 10/01/2020   PLT 260 10/01/2020   NA 136 10/01/2020   K 4.1 10/01/2020   CL 102 10/01/2020   CO2 26 10/01/2020   GLUCOSE 231 (H) 10/01/2020   BUN 12 10/01/2020   CREATININE 0.56 10/01/2020   BILITOT 0.6 05/10/2020   ALKPHOS 62 05/10/2020   AST 15 05/10/2020   ALT 18 05/10/2020   PROT 6.7 05/10/2020   ALBUMIN 3.5 05/10/2020    CALCIUM 8.6 (L) 10/01/2020   GFRAA 121 04/16/2020    Speciality Comments: No specialty comments available.  Procedures:  No procedures  performed Allergies: Phenytoin and Phenylbutazones   Assessment / Plan:     Visit Diagnoses: Seropositive rheumatoid arthritis (HCC) - Plan: Sedimentation rate, C-reactive protein  She appears to have seropositive rheumatoid arthritis based on highly positive serologies chronic joint pain and stiffness there is looks like left wrist swelling on exam today otherwise not too much synovitis, the bilateral knees and ankles look more consistent with soft tissue thickening than joint effusions and could possibly be related to lymphedema.  She was also recently treated with a very high-dose steroids for her asthma exacerbation.  We will recheck sed rate and CRP at this time.  Plan to continue the hydroxychloroquine 400 mg daily but recommended we should try addition of a different possibly more effective agent.  Antimetabolites methotrexate and leflunomide may be unsafe as next choice of treatment with recent history of abnormal liver function testing but we can also recheck this now.  Discussed options such as Enbrel or other TNF inhibitor as well.  Abnormal liver function test  Mildly abnormal LFT a few months ago, thought to be possibly from metformin use. Rechecking today, if abnormal would not recommend antimetabolite treatment due to unsafe hepatotoxicity monitoring.  High risk medication use - Plan: CBC with Differential/Platelet, COMPLETE METABOLIC PANEL WITH GFR, Hepatitis B core antibody, IgM, Hepatitis B surface antigen, Hepatitis C antibody, IgG, IgA, IgM, QuantiFERON-TB Gold Plus, Ambulatory referral to Ophthalmology  Anticipating additional treatment for rheumatoid arthritis so checking hepatitis screening, TB test, immunoglobulin test.  Referral to ophthalmology for hydroxychloroquine eye exams she has not been getting these done so far and is about 2  years on treatment with the medication.  Also repeating CBC and CMP as above.   Orders: Orders Placed This Encounter  Procedures  . CBC with Differential/Platelet  . COMPLETE METABOLIC PANEL WITH GFR  . Hepatitis B core antibody, IgM  . Hepatitis B surface antigen  . Hepatitis C antibody  . IgG, IgA, IgM  . QuantiFERON-TB Gold Plus  . Sedimentation rate  . C-reactive protein  . Ambulatory referral to Ophthalmology   No orders of the defined types were placed in this encounter.    Follow-Up Instructions: Return in about 2 weeks (around 10/25/2020) for Seropositive RA new pt f/u HCQ-discuss starting TNF.   Fuller Plan, MD  Note - This record has been created using [employer].  Chart creation errors have been sought, but may not always  have been located. Such creation errors do not reflect on  the standard of medical care.

## 2020-10-11 NOTE — Patient Instructions (Signed)
 Hydroxychloroquine tablets What is this medicine? HYDROXYCHLOROQUINE (hye drox ee KLOR oh kwin) is used to treat rheumatoid arthritis and systemic lupus erythematosus. It is also used to treat malaria. This medicine may be used for other purposes; ask your health care provider or pharmacist if you have questions. COMMON BRAND NAME(S): Plaquenil, Quineprox What should I tell my health care provider before I take this medicine? They need to know if you have any of these conditions:  diabetes  eye disease, vision problems  G6PD deficiency  heart disease  history of irregular heartbeat  if you often drink alcohol  kidney disease  liver disease  porphyria  psoriasis  an unusual or allergic reaction to chloroquine, hydroxychloroquine, other medicines, foods, dyes, or preservatives  pregnant or trying to get pregnant  breast-feeding How should I use this medicine? Take this medicine by mouth with a glass of water. Take it as directed on the prescription label. Do not cut, crush or chew this medicine. Swallow the tablets whole. Take it with food. Do not take it more than directed. Take all of this medicine unless your health care provider tells you to stop it early. Keep taking it even if you think you are better. Take products with antacids in them at a different time of day than this medicine. Take this medicine 4 hours before or 4 hours after antacids. Talk to your health care provider if you have questions. Talk to your pediatrician regarding the use of this medicine in children. While this drug may be prescribed for selected conditions, precautions do apply. Overdosage: If you think you have taken too much of this medicine contact a poison control center or emergency room at once. NOTE: This medicine is only for you. Do not share this medicine with others. What if I miss a dose? If you miss a dose, take it as soon as you can. If it is almost time for your next dose, take only  that dose. Do not take double or extra doses. What may interact with this medicine? Do not take this medicine with any of the following medications:  cisapride  dronedarone  pimozide  thioridazine This medicine may also interact with the following medications:  ampicillin  antacids  cimetidine  cyclosporine  digoxin  kaolin  medicines for diabetes, like insulin, glipizide, glyburide  medicines for seizures like carbamazepine, phenobarbital, phenytoin  mefloquine  methotrexate  other medicines that prolong the QT interval (cause an abnormal heart rhythm)  praziquantel This list may not describe all possible interactions. Give your health care provider a list of all the medicines, herbs, non-prescription drugs, or dietary supplements you use. Also tell them if you smoke, drink alcohol, or use illegal drugs. Some items may interact with your medicine. What should I watch for while using this medicine? Visit your health care provider for regular checks on your progress. Tell your health care provider if your symptoms do not start to get better or if they get worse. You may need blood work done while you are taking this medicine. If you take other medicines that can affect heart rhythm, you may need more testing. Talk to your health care provider if you have questions. Your vision may be tested before and during use of this medicine. Tell your health care provider right away if you have any change in your eyesight. This medicine may cause serious skin reactions. They can happen weeks to months after starting the medicine. Contact your health care provider right away if   you notice fevers or flu-like symptoms with a rash. The rash may be red or purple and then turn into blisters or peeling of the skin. Or, you might notice a red rash with swelling of the face, lips or lymph nodes in your neck or under your arms. If you or your family notice any changes in your behavior, such as new  or worsening depression, thoughts of harming yourself, anxiety, or other unusual or disturbing thoughts, or memory loss, call your health care provider right away. What side effects may I notice from receiving this medicine? Side effects that you should report to your doctor or health care professional as soon as possible:  allergic reactions (skin rash, itching or hives; swelling of the face, lips, or tongue)  changes in vision  decreased hearing, ringing in the ears  heartbeat rhythm changes (trouble breathing; chest pain; dizziness; fast, irregular heartbeat; feeling faint or lightheaded, falls)  liver injury (dark yellow or brown urine; general ill feeling or flu-like symptoms; loss of appetite, right upper belly pain; unusually weak or tired, yellowing of the eyes or skin)  low blood sugar (feeling anxious; confusion; dizziness; increased hunger; unusually weak or tired; increased sweating; shakiness; cold, clammy skin; irritable; headache; blurred vision; fast heartbeat; loss of consciousness)  low red blood cell counts (trouble breathing; feeling faint; lightheaded, falls; unusually weak or tired)  muscle weakness  pain, tingling, numbness in the hands or feet  rash, fever, and swollen lymph nodes  redness, blistering, peeling or loosening of the skin, including inside the mouth  suicidal thoughts, mood changes  uncontrollable head, mouth, neck, arm, or leg movements  unusual bruising or bleeding Side effects that usually do not require medical attention (report to your doctor or health care professional if they continue or are bothersome):  diarrhea  hair loss  irritable This list may not describe all possible side effects. Call your doctor for medical advice about side effects. You may report side effects to FDA at 1-800-FDA-1088. Where should I keep my medicine? Keep out of the reach of children and pets. Store at room temperature up to 30 degrees C (86 degrees F).  Protect from light. Get rid of any unused medicine after the expiration date. To get rid of medicines that are no longer needed or have expired:  Take the medicine to a medicine take-back program. Check with your pharmacy or law enforcement to find a location.  If you cannot return the medicine, check the label or package insert to see if the medicine should be thrown out in the garbage or flushed down the toilet. If you are not sure, ask your health care provider. If it is safe to put it in the trash, empty the medicine out of the container. Mix the medicine with cat litter, dirt, coffee grounds, or other unwanted substance. Seal the mixture in a bag or container. Put it in the trash.  

## 2020-10-12 ENCOUNTER — Ambulatory Visit: Payer: Medicaid Other

## 2020-10-13 LAB — QUANTIFERON-TB GOLD PLUS
Mitogen-NIL: >10 IU/mL
NIL: 0.09 IU/mL
QuantiFERON-TB Gold Plus: NEGATIVE
TB1-NIL: <0 IU/mL
TB2-NIL: <0 IU/mL

## 2020-10-13 LAB — CBC WITH DIFFERENTIAL/PLATELET
Absolute Monocytes: 722 cells/uL (ref 200–950)
Basophils Absolute: 52 cells/uL (ref 0–200)
Basophils Relative: 0.6 %
Eosinophils Absolute: 305 cells/uL (ref 15–500)
Eosinophils Relative: 3.5 %
HCT: 30.8 % — ABNORMAL LOW (ref 35.0–45.0)
Hemoglobin: 8.8 g/dL — ABNORMAL LOW (ref 11.7–15.5)
Lymphs Abs: 1583 cells/uL (ref 850–3900)
MCH: 20.1 pg — ABNORMAL LOW (ref 27.0–33.0)
MCHC: 28.6 g/dL — ABNORMAL LOW (ref 32.0–36.0)
MCV: 70.3 fL — ABNORMAL LOW (ref 80.0–100.0)
MPV: 10.8 fL (ref 7.5–12.5)
Monocytes Relative: 8.3 %
Neutro Abs: 6038 cells/uL (ref 1500–7800)
Neutrophils Relative %: 69.4 %
Platelets: 210 10*3/uL (ref 140–400)
RBC: 4.38 10*6/uL (ref 3.80–5.10)
RDW: 17.9 % — ABNORMAL HIGH (ref 11.0–15.0)
Total Lymphocyte: 18.2 %
WBC: 8.7 10*3/uL (ref 3.8–10.8)

## 2020-10-13 LAB — COMPLETE METABOLIC PANEL WITH GFR
AG Ratio: 1.4 (calc) (ref 1.0–2.5)
ALT: 14 U/L (ref 6–29)
AST: 13 U/L (ref 10–35)
Albumin: 3.6 g/dL (ref 3.6–5.1)
Alkaline phosphatase (APISO): 86 U/L (ref 31–125)
BUN: 11 mg/dL (ref 7–25)
CO2: 25 mmol/L (ref 20–32)
Calcium: 8.8 mg/dL (ref 8.6–10.2)
Chloride: 104 mmol/L (ref 98–110)
Creat: 0.69 mg/dL (ref 0.50–1.10)
GFR, Est African American: 121 mL/min/{1.73_m2} (ref >=60)
GFR, Est Non African American: 104 mL/min/{1.73_m2} (ref >=60)
Globulin: 2.6 g/dL (calc) (ref 1.9–3.7)
Glucose, Bld: 192 mg/dL — ABNORMAL HIGH (ref 65–99)
Potassium: 4.1 mmol/L (ref 3.5–5.3)
Sodium: 138 mmol/L (ref 135–146)
Total Bilirubin: 0.3 mg/dL (ref 0.2–1.2)
Total Protein: 6.2 g/dL (ref 6.1–8.1)

## 2020-10-13 LAB — IGG, IGA, IGM
IgG (Immunoglobin G), Serum: 786 mg/dL (ref 600–1640)
IgM, Serum: 93 mg/dL (ref 50–300)
Immunoglobulin A: 249 mg/dL (ref 47–310)

## 2020-10-13 LAB — C-REACTIVE PROTEIN: CRP: 14.5 mg/L — ABNORMAL HIGH (ref <8.0)

## 2020-10-13 LAB — HEPATITIS C ANTIBODY
Hepatitis C Ab: NONREACTIVE
SIGNAL TO CUT-OFF: 0.02 (ref <1.00)

## 2020-10-13 LAB — SEDIMENTATION RATE: Sed Rate: 17 mm/h (ref 0–20)

## 2020-10-13 LAB — HEPATITIS B SURFACE ANTIGEN: Hepatitis B Surface Ag: NONREACTIVE

## 2020-10-13 LAB — HEPATITIS B CORE ANTIBODY, IGM: Hep B C IgM: NONREACTIVE

## 2020-10-15 ENCOUNTER — Telehealth: Payer: Self-pay

## 2020-10-15 ENCOUNTER — Telehealth: Payer: Self-pay | Admitting: Internal Medicine

## 2020-10-15 ENCOUNTER — Telehealth: Payer: Self-pay | Admitting: Adult Health

## 2020-10-15 NOTE — Telephone Encounter (Signed)
Attempted to contact patient, LVM advising patient ophthalmology referral has been sent to [facility], provided patient with their phone # and advised we typically check back in no sooner than a week after referral was sent.   Also advised, per 10/11/2020 office note, discussion of new medication was mentioned, however unsure of which medication specifically (Enbrel or other TNF inhibitor?) but I would reach out to Dr. Dimple Casey for clarification.   Advised patient Dr. Dimple Casey is out of the office this week and might be first of next week before I have clarification on plan.

## 2020-10-15 NOTE — Telephone Encounter (Signed)
Pt called concerned with still bleeding.  Cycle started 09/25/2020 & pt states she's still bleeding, dark slimy clots HA off & on - OTC's no help, with lots of nausea  Please advise & notify pt - pt would like advise soon as she's considering going to the ER    [facility]

## 2020-10-15 NOTE — Telephone Encounter (Signed)
Spoke with patient she has been bleeding since 09/25/20 it goes from heavy to light, and is dark red with an odor. Spoke with Victorino Dike, patient will be worked in on Wednesday at 3:15.

## 2020-10-15 NOTE — Telephone Encounter (Signed)
Pt is calling in she states that she has a headache that is getting worse, she is not sure if it is coming from her bleeding, Cycle, of if it is something else, she also has a call into GYN

## 2020-10-15 NOTE — Telephone Encounter (Signed)
Attempted to call, no answer. Sent mychart message

## 2020-10-15 NOTE — Telephone Encounter (Signed)
Pt will need an appt to discuss with provider

## 2020-10-15 NOTE — Telephone Encounter (Signed)
Per the Chart--   Elizabeth Dawley, LPN      3/88/82 1:47 PM Note Spoke with patient she has been bleeding since 09/25/20 it goes from heavy to light, and is dark red with an odor. Spoke with Victorino Dike, patient will be worked in on Wednesday at 3:15.     I have LMOM to see what the GYN advises, and if it is not related to call us back to schedule an appointment

## 2020-10-15 NOTE — Telephone Encounter (Signed)
Patient states doctor was referring her to Opthalmology, but she has not heard from anyone yet. AVS states doctor was starting her on a new medication, but she does not know what that was. Patient states she is in a lot of pain. Please call to discuss.

## 2020-10-16 ENCOUNTER — Ambulatory Visit: Payer: Medicaid Other | Admitting: Orthopaedic Surgery

## 2020-10-17 ENCOUNTER — Other Ambulatory Visit: Payer: Self-pay

## 2020-10-17 ENCOUNTER — Ambulatory Visit (INDEPENDENT_AMBULATORY_CARE_PROVIDER_SITE_OTHER): Payer: Medicaid Other | Admitting: Adult Health

## 2020-10-17 ENCOUNTER — Other Ambulatory Visit (HOSPITAL_COMMUNITY)
Admission: RE | Admit: 2020-10-17 | Discharge: 2020-10-17 | Disposition: A | Discharge status: Home / Self Care | Payer: Medicaid Other | Source: Ambulatory Visit | Attending: Adult Health | Admitting: Adult Health

## 2020-10-17 ENCOUNTER — Encounter: Payer: Self-pay | Admitting: Adult Health

## 2020-10-17 VITALS — BP 147/101 | HR 105 | Ht 64.0 in | Wt 293.0 lb

## 2020-10-17 DIAGNOSIS — D500 Iron deficiency anemia secondary to blood loss (chronic): Secondary | ICD-10-CM | POA: Insufficient documentation

## 2020-10-17 DIAGNOSIS — R110 Nausea: Secondary | ICD-10-CM | POA: Insufficient documentation

## 2020-10-17 DIAGNOSIS — G44209 Tension-type headache, unspecified, not intractable: Secondary | ICD-10-CM | POA: Diagnosis not present

## 2020-10-17 DIAGNOSIS — Z113 Encounter for screening for infections with a predominantly sexual mode of transmission: Secondary | ICD-10-CM | POA: Insufficient documentation

## 2020-10-17 DIAGNOSIS — Z3202 Encounter for pregnancy test, result negative: Secondary | ICD-10-CM

## 2020-10-17 DIAGNOSIS — N921 Excessive and frequent menstruation with irregular cycle: Secondary | ICD-10-CM

## 2020-10-17 LAB — POCT URINE PREGNANCY: Preg Test, Ur: NEGATIVE

## 2020-10-17 MED ORDER — PROMETHAZINE HCL 25 MG PO TABS: 25.0000 mg | ORAL_TABLET | Freq: Four times a day (QID) | ORAL | 1 refills | Status: DC | PRN

## 2020-10-17 NOTE — Progress Notes (Signed)
  Subjective:     Patient ID: Elizabeth Bautista, female   DOB: Aug 26, 1975, 45 y.o.   MRN: [id number]  HPI Elizabeth Bautista is a 45 year old black female, single, G5P5 in complaining of this period lasting about 3 weeks, it has been heavy in the past with clots. And having nausea and headache. She was recently in hospital with asthma, and HGB 8.8 on iron. She was supposed to get [place] in January to assess uterus and she forgot to go. PCP is Dr Allena Katz.   Review of Systems  +having long heavy periods  +nausea +headache Reviewed past medical,surgical, social and family history. Reviewed medications and allergies.     Objective:   Physical Exam BP (!) 147/101 (BP Location: Left Arm, Patient Position: Sitting, Cuff Size: Large)   Pulse (!) 105   Ht 5\' 4"  (1.626 m)   Wt 293 lb (132.9 kg)   LMP 09/25/2020   BMI 50.29 kg/m UPT is negative.   Skin warm and dry.Pelvic: external genitalia is normal in appearance no lesions, vagina: white discharge without odor,urethra has no lesions or masses noted, cervix:smooth and bulbous, uterus: normal size, shape and contour, non tender, no masses felt, adnexa: no masses or tenderness noted. Bladder is non tender and no masses felt. CV swab obtained. Fall risk is low  [facility] - 10/17/20 1604      Pregnancy Intention Screening   Does the patient want to become pregnant in the next year? N/A    Does the patient's partner want to become pregnant in the next year? N/A    Would the patient like to discuss contraceptive options today? N/A      Contraception Wrap Up   Current Method Female Sterilization    End Method Female Sterilization    Contraception Counseling Provided No          Assessment:     1. Pregnancy examination or test, negative result  2. Menorrhagia with irregular cycle Get GYN 10/19/20 10/24/20 at 11:30 am at [facility]  3. Nausea Will rx phenergan Meds ordered this encounter  Medications  . promethazine (PHENERGAN) 25 MG tablet    Sig: Take 1 tablet (25  mg total) by mouth every 6 (six) hours as needed for nausea or vomiting.    Dispense:  30 tablet    Refill:  1    Order Specific Question:   Supervising Provider    Answer:   [facility], [facility] [[id number]]    4. Tension-type headache, not intractable, unspecified chronicity pattern Follow up PCP  5. Iron deficiency anemia due to chronic blood loss Take iron bid  6. Screening examination for STD (sexually transmitted disease) CV swab sent for GC/CHL,trich and BV and yeast     Plan:       We will talk when Elizabeth Bautista back

## 2020-10-18 ENCOUNTER — Telehealth: Payer: Self-pay | Admitting: Internal Medicine

## 2020-10-18 NOTE — Telephone Encounter (Signed)
[facility] calling in reference to receiving referral for patient. They will be glad to schedule patient, but they need Diagnosis / why they are seeing patient. Please call to advise.

## 2020-10-19 LAB — CERVICOVAGINAL ANCILLARY ONLY
Bacterial Vaginitis (gardnerella): NEGATIVE
Candida Glabrata: NEGATIVE
Candida Vaginitis: NEGATIVE
Chlamydia: NEGATIVE
Comment: NEGATIVE
Comment: NEGATIVE
Comment: NEGATIVE
Comment: NEGATIVE
Comment: NEGATIVE
Comment: NORMAL
Neisseria Gonorrhea: NEGATIVE
Trichomonas: NEGATIVE

## 2020-10-19 NOTE — Telephone Encounter (Signed)
LMOM at [facility], high risk medication use for [facility] exam

## 2020-10-23 ENCOUNTER — Other Ambulatory Visit: Payer: Self-pay | Admitting: *Deleted

## 2020-10-23 MED ORDER — FERROUS SULFATE 325 (65 FE) MG PO TABS: 325.0000 mg | ORAL_TABLET | Freq: Two times a day (BID) | ORAL | 0 refills | Status: DC

## 2020-10-24 ENCOUNTER — Other Ambulatory Visit (HOSPITAL_COMMUNITY): Payer: Medicaid Other

## 2020-10-24 ENCOUNTER — Ambulatory Visit (HOSPITAL_COMMUNITY): Payer: Medicaid Other

## 2020-10-24 NOTE — Telephone Encounter (Signed)
Spoke with patient, advised, per Dr. Dimple Casey, he was discussing possibility of starting a TNF inhibitor for her RA but wanted to take another look and discuss with her since it is not clear whether she has much active disease, CRP elevation is modest and that can come from lymphedema and obesity since other markers of inflammation are not high. Dr. Dimple Casey planned to discuss this in person due to discussion of risks/benefits with medication. Patient is in agreement to discuss this at follow-up visit on 11/02/2020.

## 2020-10-24 NOTE — Telephone Encounter (Signed)
Yes I was discussing possible starting a TNF inhibitor for her RA I wanted to take another look ant discuss with her since it is not clear whether she has much active disease, CRP elevation is modest and that can come from lymphedema and obesity since other markers of inflammation are not high. I planned to discuss this in person due to discussion of risks/benefits with medication. If she feels very strongly/confident about wanting to though, we can go ahead and have Devki start the process for starting Enbrel.

## 2020-10-25 ENCOUNTER — Telehealth: Payer: Medicaid Other | Admitting: Internal Medicine

## 2020-10-25 ENCOUNTER — Other Ambulatory Visit: Payer: Self-pay

## 2020-10-26 ENCOUNTER — Ambulatory Visit: Payer: Medicaid Other

## 2020-10-26 ENCOUNTER — Ambulatory Visit: Payer: Medicaid Other | Admitting: Allergy & Immunology

## 2020-10-30 ENCOUNTER — Ambulatory Visit: Payer: Medicaid Other | Admitting: Orthopaedic Surgery

## 2020-11-01 ENCOUNTER — Encounter: Payer: Self-pay | Admitting: Internal Medicine

## 2020-11-01 ENCOUNTER — Other Ambulatory Visit: Payer: Self-pay

## 2020-11-01 ENCOUNTER — Telehealth (INDEPENDENT_AMBULATORY_CARE_PROVIDER_SITE_OTHER): Payer: Medicaid Other | Admitting: Internal Medicine

## 2020-11-01 DIAGNOSIS — G43809 Other migraine, not intractable, without status migrainosus: Secondary | ICD-10-CM | POA: Diagnosis not present

## 2020-11-01 DIAGNOSIS — Z748 Other problems related to care provider dependency: Secondary | ICD-10-CM | POA: Diagnosis not present

## 2020-11-01 MED ORDER — SUMATRIPTAN SUCCINATE 50 MG PO TABS: 50.0000 mg | ORAL_TABLET | ORAL | 0 refills | Status: DC | PRN

## 2020-11-01 NOTE — Progress Notes (Signed)
Virtual Visit via Video Note   This visit type was conducted due to national recommendations for restrictions regarding the COVID-19 Pandemic (e.g. social distancing) in an effort to limit this patient's exposure and mitigate transmission in our community.  Due to her co-morbid illnesses, this patient is at least at moderate risk for complications without adequate follow up.  This format is felt to be most appropriate for this patient at this time.  All issues noted in this document were discussed and addressed.  A limited physical exam was performed with this format.  Please refer to the patient's chart for her consent to telehealth for [facility].      Evaluation Performed:  Follow-up visit  Date:  11/01/2020   ID:  Elizabeth Bautista, DOB [birth year], MRN [id number]  Patient Location: Home Provider Location: Office/Clinic  Location of Patient: Home Location of Provider: Telehealth Consent was obtain for visit to be over via telehealth. I verified that I am speaking with the correct person using two identifiers.  PCP:  [name], MD   Chief Complaint:  Headache  History of Present Illness:    Elizabeth Bautista is a 45 y.o. female who has a televisit for c/o headache for last few days, which is constant, and is associated with intermittent numbness over face and nausea. She has h/o migraine when she was younger. She denies photo- or phonosensitivity. Denies any recent injury. Denies any seizure-like activity recently.  The patient does not have symptoms concerning for COVID-19 infection (fever, chills, cough, or new shortness of breath).   Past Medical, Surgical, Social History, Allergies, and Medications have been Reviewed.  Past Medical History:  Diagnosis Date  . Anemia   . Asthma   . COVID-19 virus infection 05/16/2020   04/27/20 dx covid-19, not hospitalized  . Diabetes ([place])    on Metformin  . Eczema   . Edema   . Heart problem    "something with the arteries on the  left side of the heart"; upcoming appt with cardiology for evaluation  . HTN (hypertension)   . Rheumatoid arthritis ([place])    on Plaquenil   . Seizures ([place])    last seizure 2019  . Sleep apnea   . Sleep apnea    Past Surgical History:  Procedure Laterality Date  . APPENDECTOMY    . CESAREAN SECTION     x3     Current Meds  Medication Sig  . albuterol (PROAIR HFA) 108 (90 Base) MCG/ACT inhaler 2 puffs every 4 hours as needed only  if your can't catch your breath  . albuterol (PROVENTIL) (2.5 MG/3ML) 0.083% nebulizer solution Take 3 mLs (2.5 mg total) by nebulization every 6 (six) hours as needed for wheezing or shortness of breath.  Marland Kitchen amLODipine (NORVASC) 5 MG tablet Take 1 tablet (5 mg total) by mouth daily.  . blood glucose meter kit and supplies Dispense based on patient and insurance preference. Use up to four times daily as directed. (FOR ICD-10 E10.9, E11.9).  Marland Kitchen Blood Pressure Monitoring (SPHYGMOMANOMETER) MISC 1 each by Does not apply route daily.  . budesonide (PULMICORT) 0.5 MG/2ML nebulizer solution Take 2 mLs (0.5 mg total) by nebulization 2 (two) times daily.  . busPIRone (BUSPAR) 7.5 MG tablet Take 7.5 mg by mouth 2 (two) times daily.  . cetirizine (ZYRTEC) 10 MG tablet Take 1 tablet (10 mg total) by mouth daily.  . Cholecalciferol 1.25 MG (50000 UT) TABS Take 1 tablet by mouth daily.  Marland Kitchen  cyclobenzaprine (FLEXERIL) 10 MG tablet Take 10 mg by mouth 3 (three) times daily as needed for muscle spasms.   Marland Kitchen EPINEPHrine 0.3 mg/0.3 mL IJ SOAJ injection Inject 0.3 mg into the muscle as needed for anaphylaxis.  Marland Kitchen escitalopram (LEXAPRO) 20 MG tablet Take 1 tablet by mouth every evening.  . famotidine (PEPCID) 20 MG tablet Take 20 mg by mouth daily.  . ferrous sulfate 325 (65 FE) MG tablet Take 1 tablet (325 mg total) by mouth 2 (two) times daily. Take 325 mg by mouth 2 (two) times daily.  . fluticasone (CUTIVATE) 0.005 % ointment Apply 1 application topically daily.  . fluticasone  (FLONASE) 50 MCG/ACT nasal spray Place 1 spray into both nostrils daily.  . furosemide (LASIX) 20 MG tablet Take 20 mg by mouth daily.  . hydrochlorothiazide (HYDRODIURIL) 25 MG tablet Take 1 tablet (25 mg total) by mouth daily. For high blood pressure.  . hydroxychloroquine (PLAQUENIL) 200 MG tablet Take 1 tablet (200 mg total) by mouth 2 (two) times daily.  . hydrOXYzine (ATARAX/VISTARIL) 25 MG tablet Take 25 mg by mouth 2 (two) times daily.  Marland Kitchen ibuprofen (ADVIL) 800 MG tablet Take 1 tablet (800 mg total) by mouth every 8 (eight) hours as needed.  . mirtazapine (REMERON) 30 MG tablet Take 30 mg by mouth at bedtime.  . pantoprazole (PROTONIX) 40 MG tablet Take 1 tablet (40 mg total) by mouth daily. Take 30-60 min before first meal of the day  . promethazine (PHENERGAN) 25 MG tablet Take 1 tablet (25 mg total) by mouth every 6 (six) hours as needed for nausea or vomiting.  Marland Kitchen QUEtiapine (SEROQUEL) 25 MG tablet Take 25 mg by mouth 2 (two) times daily.   . revefenacin (YUPELRI) 175 MCG/3ML nebulizer solution Take 3 mLs (175 mcg total) by nebulization daily.  . SUMAtriptan (IMITREX) 50 MG tablet Take 1 tablet (50 mg total) by mouth every 2 (two) hours as needed for migraine or headache. May repeat in 2 hours if headache persists or recurs.  . traMADol (ULTRAM) 50 MG tablet Take 50 mg by mouth 2 (two) times daily.  Marland Kitchen triamcinolone (KENALOG) 0.025 % ointment Apply 1 application topically 2 (two) times daily.  Marland Kitchen triamcinolone ointment (KENALOG) 0.5 % Apply 1 application topically as needed.     Allergies:   Phenytoin and Phenylbutazones   ROS:   Please see the history of present illness.     All other systems reviewed and are negative.   Labs/Other Tests and Data Reviewed:    Recent Labs: 02/16/2020: TSH 1.050 03/13/2020: Magnesium 2.1 09/27/2020: B Natriuretic Peptide 23.0 10/11/2020: ALT 14; BUN 11; Creat 0.69; Hemoglobin 8.8; Platelets 210; Potassium 4.1; Sodium 138   Recent Lipid Panel Lab  Results  Component Value Date/Time   CHOL 212 (H) 01/18/2020 03:00 PM   TRIG 88 01/18/2020 03:00 PM   HDL 82 01/18/2020 03:00 PM   CHOLHDL 2.6 01/18/2020 03:00 PM   LDLCALC 111 (H) 01/18/2020 03:00 PM    Wt Readings from Last 3 Encounters:  10/17/20 293 lb (132.9 kg)  10/11/20 294 lb (133.4 kg)  09/27/20 288 lb 12.8 oz (131 kg)      ASSESSMENT & PLAN:    Migraine Some symptoms consistent with Migraine Trial of Sumatriptan Advised to contact Neurology If persistent symptoms or new neurological symptoms, advised to go to ER immediately Last CT head unremarkable  Needs help with transportation Referred to social work  Time:   Today, I have spent 16 minutes reviewing  the chart, including problem list, medications, and with the patient with telehealth technology discussing the above problems.   Medication Adjustments/Labs and Tests Ordered: Current medicines are reviewed at length with the patient today.  Concerns regarding medicines are outlined above.   Tests Ordered: No orders of the defined types were placed in this encounter.   Medication Changes: Meds ordered this encounter  Medications  . SUMAtriptan (IMITREX) 50 MG tablet    Sig: Take 1 tablet (50 mg total) by mouth every 2 (two) hours as needed for migraine or headache. May repeat in 2 hours if headache persists or recurs.    Dispense:  10 tablet    Refill:  0     Note: This dictation was prepared with Dragon dictation along with smaller phrase technology. Similar sounding words can be transcribed inadequately or may not be corrected upon review. Any transcriptional errors that result from this process are unintentional.      Disposition:  Follow up  Signed, [name], MD  11/01/2020 2:37 PM     [place]

## 2020-11-01 NOTE — Addendum Note (Signed)
Addended by: Ishmael Holter R on: 11/01/2020 04:05 PM   Modules accepted: Orders

## 2020-11-01 NOTE — Progress Notes (Deleted)
Office Visit Note  Patient: Elizabeth Bautista             Date of Birth: 29-Apr-1976           MRN: [id number]             PCP: Anabel Halon, MD Referring: Anabel Halon, MD Visit Date: 11/02/2020   Subjective:  No chief complaint on file.   History of Present Illness: Elizabeth Bautista is a 45 y.o. female here for follow up for seropositive RA currently on hydroxychloroquine but having persistent joint pains and swelling.At our initial visit alternative treatments were discussed including antimetabolites and TNF inhibitors, with consideration for recent abnormal LFTs reviewed.***   Labs reviewed 10/2018 RF ~30, CCP >250 ANA neg  Imaging reviewed Chest xray 08/2020 Cardiomegaly no airspace disease  Lumbar spine xray 08/2020 No acute findings  No Rheumatology ROS completed.   PMFS History:  Patient Active Problem List   Diagnosis Date Noted  . Iron deficiency anemia due to chronic blood loss 10/17/2020  . Tension-type headache, not intractable 10/17/2020  . Nausea 10/17/2020  . Pregnancy examination or test, negative result 10/17/2020  . Abnormal liver function test 10/11/2020  . High risk medication use 10/11/2020  . Asthma exacerbation 09/27/2020  . Severe asthma with exacerbation 09/27/2020  . Acute hip pain, left 09/19/2020  . Sciatica 09/11/2020  . Encounter for screening fecal occult blood testing 06/04/2020  . Encounter for gynecological examination with Papanicolaou smear of cervix 06/04/2020  . Screening examination for STD (sexually transmitted disease) 06/04/2020  . Dysmenorrhea 06/04/2020  . Menorrhagia with irregular cycle 06/04/2020  . Anxiety and depression 06/04/2020  . Chronic low back pain 05/24/2020  . Physical deconditioning 05/24/2020  . Asthma, moderate persistent 03/08/2020  . GERD (gastroesophageal reflux disease) 03/07/2020  . Anxiety 03/07/2020  . History of seizures 03/07/2020  . Upper airway cough syndrome 02/10/2020  . Swelling of lower  extremity 02/01/2020  . Essential hypertension 02/01/2020  . Vitamin D deficiency 02/01/2020  . Type 2 diabetes mellitus with hyperglycemia, without long-term current use of insulin (HCC) 02/01/2020  . Anemia 02/01/2020  . Class 3 severe obesity with serious comorbidity and body mass index (BMI) of 45.0 to 49.9 in adult [facility]) 10/22/2019  . OSA on CPAP 10/18/2019  . Depression 10/18/2019  . Seropositive rheumatoid arthritis (HCC) 10/18/2019    Past Medical History:  Diagnosis Date  . Anemia   . Asthma   . COVID-19 virus infection 05/16/2020   04/27/20 dx covid-19, not hospitalized  . Diabetes (HCC)    on Metformin  . Eczema   . Edema   . 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 HTN (hypertension)   . Rheumatoid arthritis (HCC)    on Plaquenil   . Seizures (HCC)    last seizure 2019  . Sleep apnea   . Sleep apnea     Family History  Problem Relation Age of Onset  . High blood pressure Mother   . Rheum arthritis Mother   . Diabetes Father   . Diabetes Sister   . Heart Problems Sister   . Diabetes Brother   . Diabetes Paternal Grandmother   . Diabetes Other        father's side "everybody died from Diabetes"  . High blood pressure Other        mother's side, multiple siblings with this   .  Heart attack Other        family member on mother's side   . Diabetes Paternal Aunt   . Seizures Cousin        not sibings to the other cousins with seizures  . Breast cancer Cousin   . Seizures Cousin        not sibings to the other cousins with seizures  . Seizures Cousin        not sibings to the other cousins with seizures  . Cervical cancer Maternal Aunt   . Breast cancer Cousin   . Dementia Maternal Aunt   . Asthma Maternal Aunt   . Heart Problems Maternal Grandmother   . Diabetes Brother   . Asthma Daughter   . Angioedema Daughter   . Asthma Son    Past Surgical History:  Procedure  Laterality Date  . APPENDECTOMY    . CESAREAN SECTION     x3   Social History   Social History Narrative   Divorced.Lives with 3 kids.Originally from [place].Came from [place] ,[place] 7 months ago.      02/27/2020   Right handed   Caffeine: none     There is no immunization history on file for this patient.   Objective: Vital Signs: There were no vitals taken for this visit.   Physical Exam   Musculoskeletal Exam: ***  CDAI Exam: CDAI Score: -- Patient Global: --; Provider Global: -- Swollen: --; Tender: -- Joint Exam 11/02/2020   No joint exam has been documented for this visit   There is currently no information documented on the homunculus. Go to the Rheumatology activity and complete the homunculus joint exam.  Investigation: No additional findings.  Imaging: No results found.  Recent Labs: Lab Results  Component Value Date   WBC 8.7 10/11/2020   HGB 8.8 (L) 10/11/2020   PLT 210 10/11/2020   NA 138 10/11/2020   K 4.1 10/11/2020   CL 104 10/11/2020   CO2 25 10/11/2020   GLUCOSE 192 (H) 10/11/2020   BUN 11 10/11/2020   CREATININE 0.69 10/11/2020   BILITOT 0.3 10/11/2020   ALKPHOS 62 05/10/2020   AST 13 10/11/2020   ALT 14 10/11/2020   PROT 6.2 10/11/2020   ALBUMIN 3.5 05/10/2020   CALCIUM 8.8 10/11/2020   GFRAA 121 10/11/2020   QFTBGOLDPLUS NEGATIVE 10/11/2020    Speciality Comments: No specialty comments available.  Procedures:  No procedures performed Allergies: Phenytoin and Phenylbutazones   Assessment / Plan:     Visit Diagnoses: No diagnosis found.  ***  Orders: No orders of the defined types were placed in this encounter.  No orders of the defined types were placed in this encounter.    Follow-Up Instructions: No follow-ups on file.   Fuller Plan, MD  Note - This record has been created using [employer].  Chart creation errors have been sought, but may not always  have been located. Such creation errors do not  reflect on  the standard of medical care.

## 2020-11-02 ENCOUNTER — Ambulatory Visit: Payer: Medicaid Other | Admitting: Internal Medicine

## 2020-11-05 ENCOUNTER — Ambulatory Visit: Payer: Medicaid Other | Admitting: Pulmonary Disease

## 2020-11-07 ENCOUNTER — Other Ambulatory Visit: Payer: Self-pay

## 2020-11-07 ENCOUNTER — Ambulatory Visit (HOSPITAL_COMMUNITY)
Admission: RE | Admit: 2020-11-07 | Discharge: 2020-11-07 | Disposition: A | Discharge status: Home / Self Care | Payer: Medicaid Other | Source: Ambulatory Visit | Attending: Adult Health | Admitting: Adult Health

## 2020-11-07 ENCOUNTER — Ambulatory Visit (HOSPITAL_COMMUNITY)
Admission: RE | Admit: 2020-11-07 | Discharge: 2020-11-07 | Disposition: A | Discharge status: Home / Self Care | Payer: Medicaid Other | Source: Ambulatory Visit | Attending: Orthopaedic Surgery | Admitting: Orthopaedic Surgery

## 2020-11-07 DIAGNOSIS — N946 Dysmenorrhea, unspecified: Secondary | ICD-10-CM

## 2020-11-07 DIAGNOSIS — N921 Excessive and frequent menstruation with irregular cycle: Secondary | ICD-10-CM

## 2020-11-07 DIAGNOSIS — M5442 Lumbago with sciatica, left side: Secondary | ICD-10-CM | POA: Diagnosis not present

## 2020-11-07 DIAGNOSIS — G8929 Other chronic pain: Secondary | ICD-10-CM | POA: Diagnosis present

## 2020-11-07 IMAGING — MR MR LUMBAR SPINE W/O CM
5 series · 31 of 48 positions shown · non-contrast
Comparison: Prior radiograph from [DATE].

CLINICAL DATA: Initial evaluation for severe lower back pain with
left lower extremity pain.

EXAM:
MRI LUMBAR SPINE WITHOUT CONTRAST
TECHNIQUE: Multiplanar, multisequence MR imaging of the lumbar spine was
performed. No intravenous contrast was administered.

[Series 5: T2 · sagittal · 4.0mm · 0.68mm/px · 7 of 15 slices shown (1 of 2)]
[im 1/15]
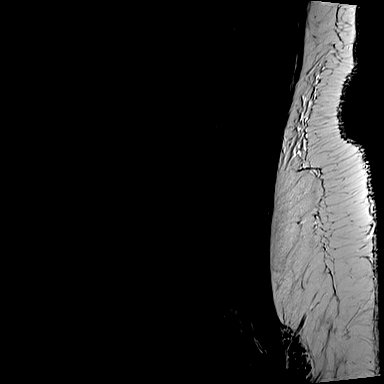
[im 3/15]
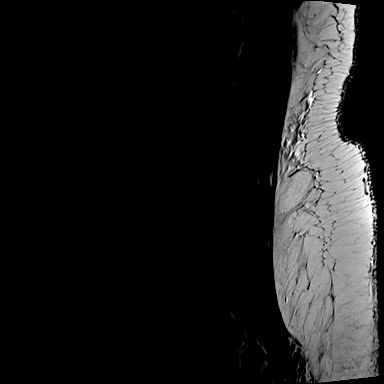
[im 5/15]
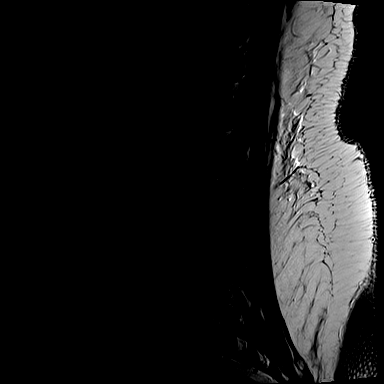
[im 8/15]
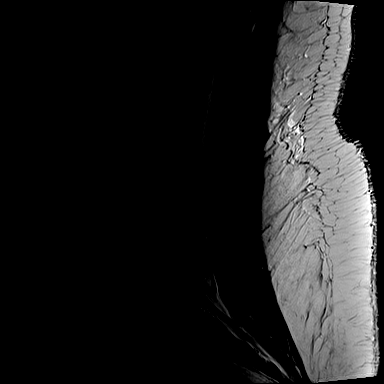
[im 10/15]
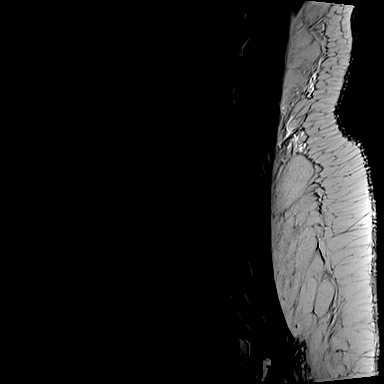
[im 12/15]
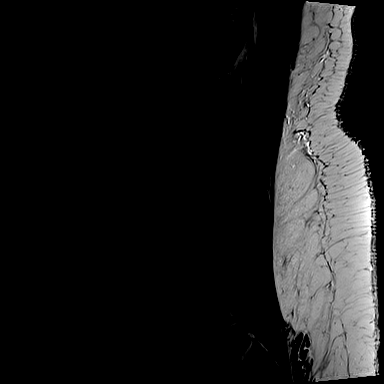
[im 15/15]
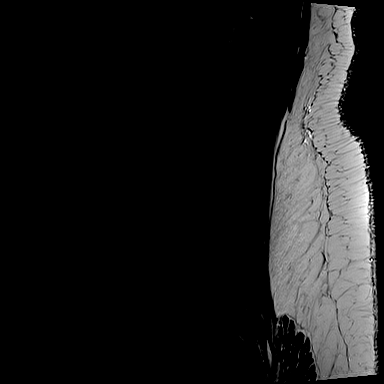

[Series 6: T1 · sagittal · 4.0mm · 0.81mm/px · 7 of 15 slices shown (1 of 2)]
[im 1/15]
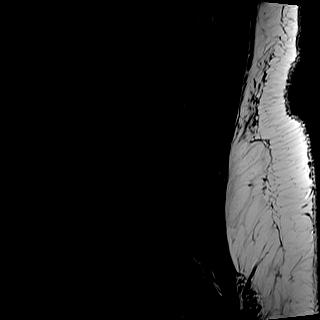
[im 3/15]
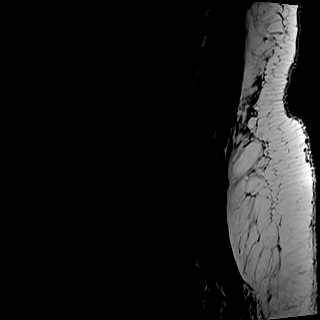
[im 5/15]
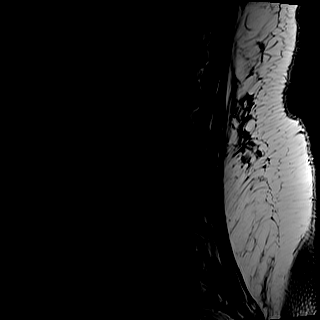
[im 8/15]
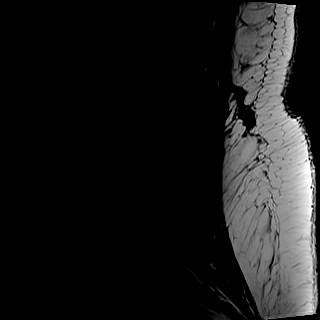
[im 10/15]
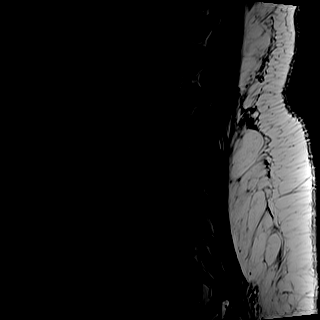
[im 12/15]
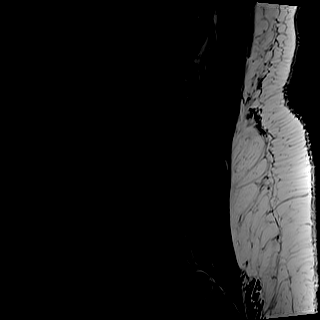
[im 15/15]
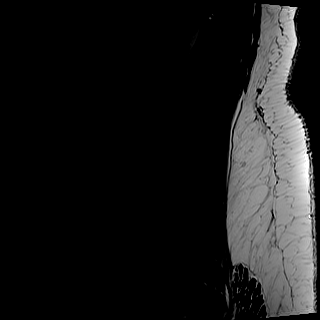

[Series 7: STIR · sagittal · 4.0mm · 0.51mm/px · 1 of 15 slices shown]
[im 1/15]
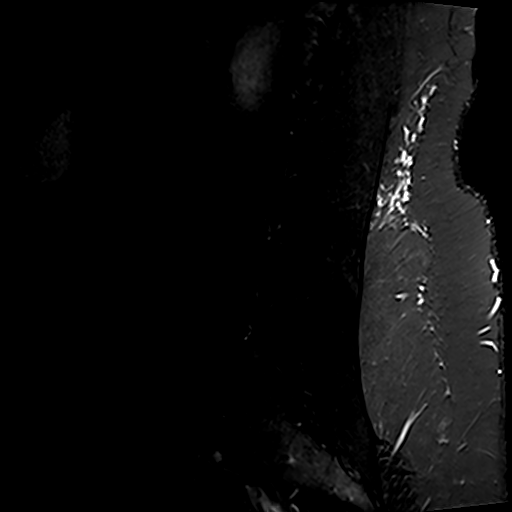

[Series 8: T2 · axial · 4.0mm · 0.70mm/px · z∈[-97,+104]mm · 8 of 33 slices shown (2 of 2)]
[im 1/33]
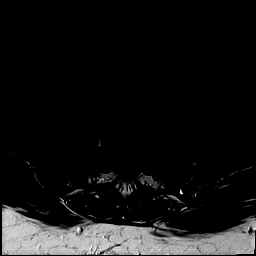
[im 5/33]
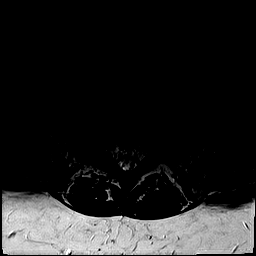
[im 10/33]
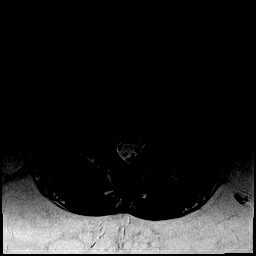
[im 15/33]
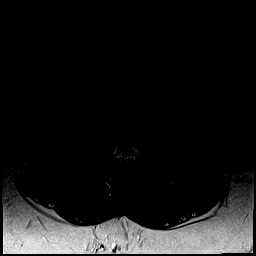
[im 18/33]
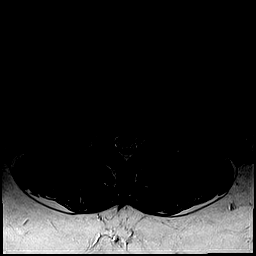
[im 23/33]
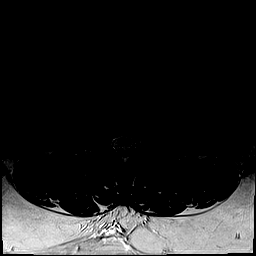
[im 28/33]
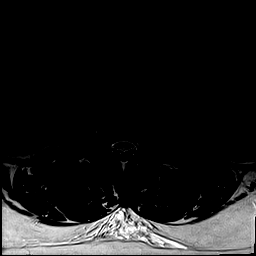
[im 33/33]
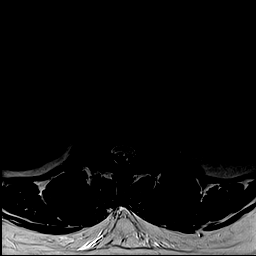

[Series 9: T1 · axial · 4.0mm · 0.35mm/px · z∈[-97,+104]mm · 8 of 33 slices shown (2 of 2)]
[im 1/33]
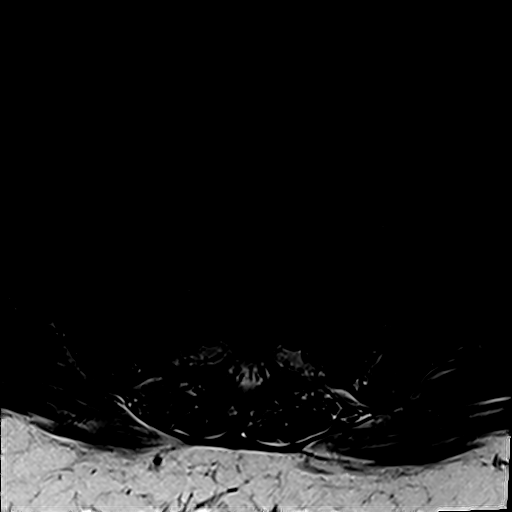
[im 5/33]
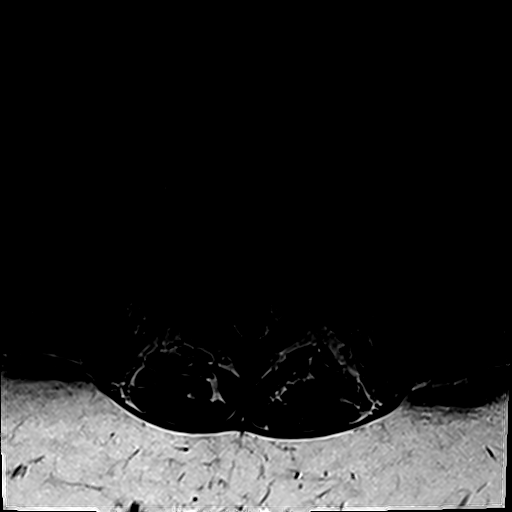
[im 10/33]
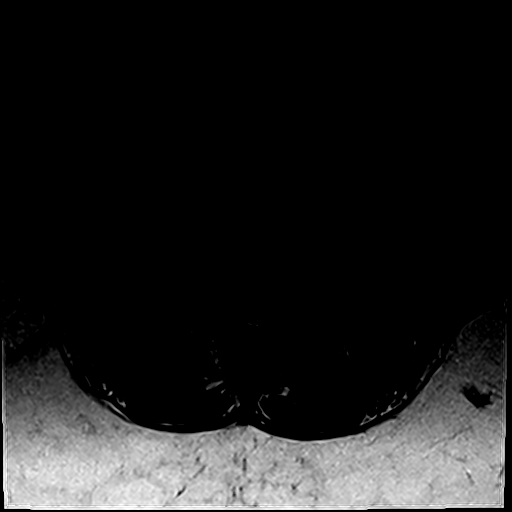
[im 15/33]
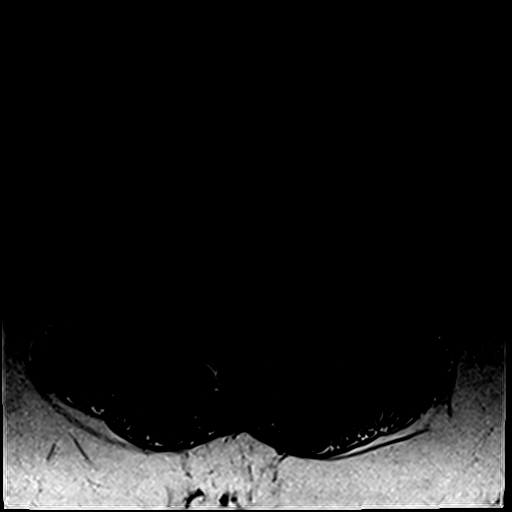
[im 18/33]
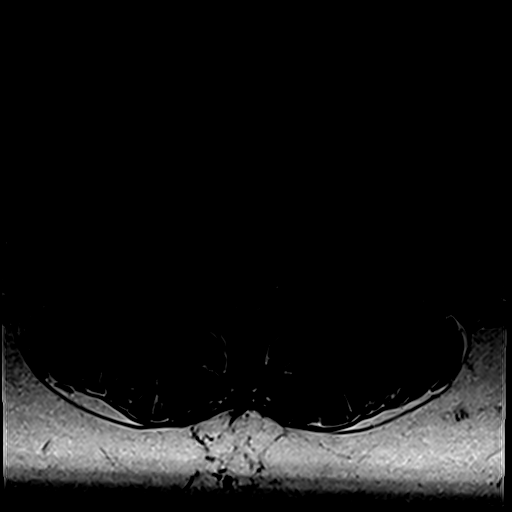
[im 23/33]
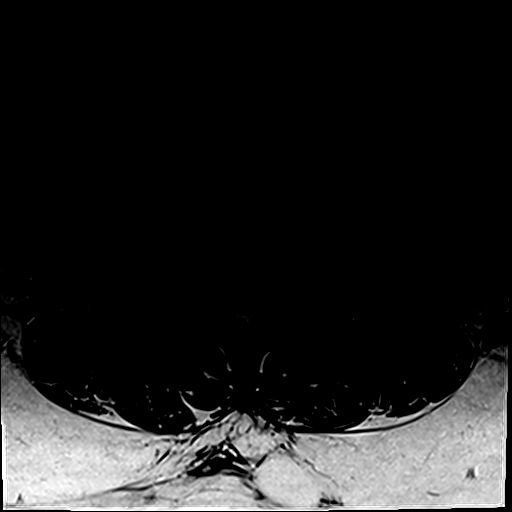
[im 28/33]
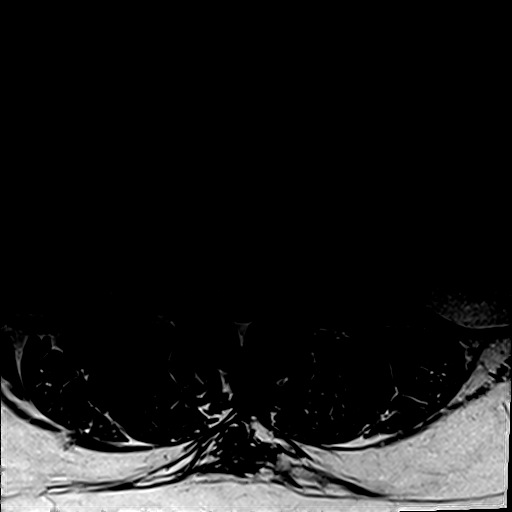
[im 33/33]
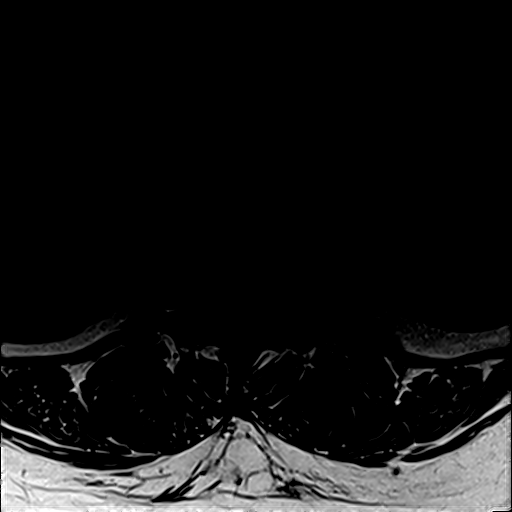

[31 of 48 positions shown; findings below may reference images not displayed]

FINDINGS: Segmentation: Standard. Lowest well-formed disc space labeled the
L5-S1 level.

Alignment: Physiologic with preservation of the normal lumbar
lordosis. No listhesis.

Vertebrae: Vertebral body height well maintained without acute or
chronic fracture. Bone marrow signal intensity within normal limits.
No discrete or worrisome osseous lesions. No abnormal marrow edema.

Conus medullaris and cauda equina: Conus extends to the L1 level.
Conus and cauda equina appear normal.

Paraspinal and other soft tissues: Paraspinous soft tissues within
normal limits. Visualized visceral structures are normal.

Disc levels:

L1-2:  Unremarkable.

L2-3:  Unremarkable.

L3-4: Mild disc bulge. Superimposed small left foraminal disc
protrusion contacts the exiting left L3 nerve root as it courses of
the left neural foramen (series 8, image 17). No spinal stenosis.
Foramina remain patent.

L4-5: Mild disc bulge. Minimal facet spurring. No spinal stenosis.
Mild bilateral L4 foraminal narrowing.

L5-S1: Disc desiccation. Broad base central disc protrusion closely
approximates the descending S1 nerve roots without frank neural
impingement or displacement (series 8, image 26). Minimal facet
spurring. No canal or lateral recess stenosis. Mild bilateral L5
foraminal stenosis.
IMPRESSION: 1. Small left foraminal disc protrusion at L3-4, contacting and
potentially irritating the exiting left L3 nerve root. Finding could
contribute to left lower extremity symptoms.
2. Broad central disc protrusion at L5-S1, closely approximating the
descending S1 nerve roots without frank neural impingement or
displacement.
3. Mild disc bulging with facet spurring at L4-5 and L5-S1 with
resultant mild bilateral L4 and L5 foraminal stenosis.

## 2020-11-07 IMAGING — US US PELVIS COMPLETE WITH TRANSVAGINAL
1 series · 14 of 25 positions shown · non-contrast
Comparison: None

CLINICAL DATA: Menorrhagia, dysmenorrhea



[Series 1: us pelvic complete with transvaginal · 14 of 110 slices shown]
[im 1/110]
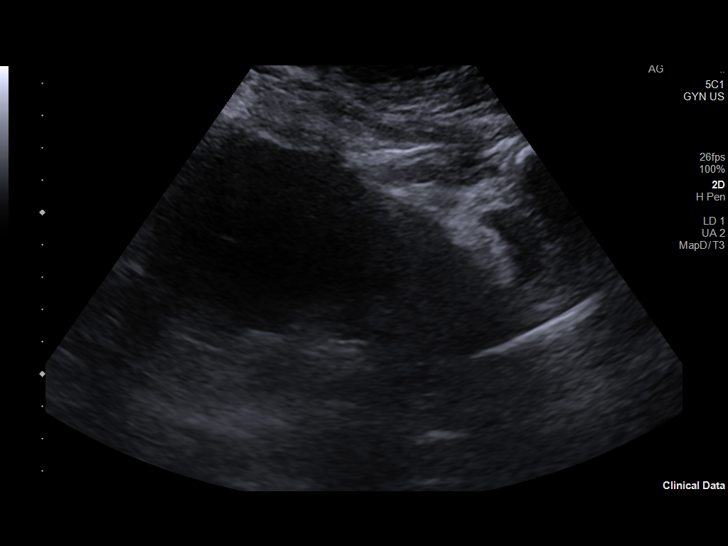
[im 10/110]
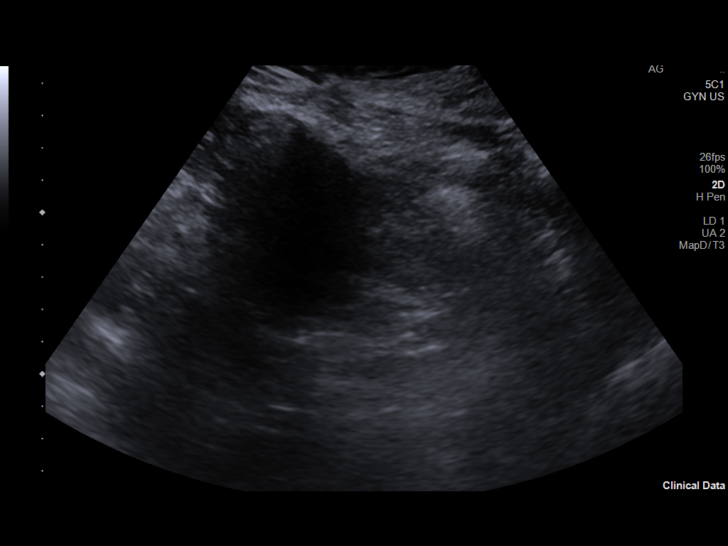
[im 19/110]
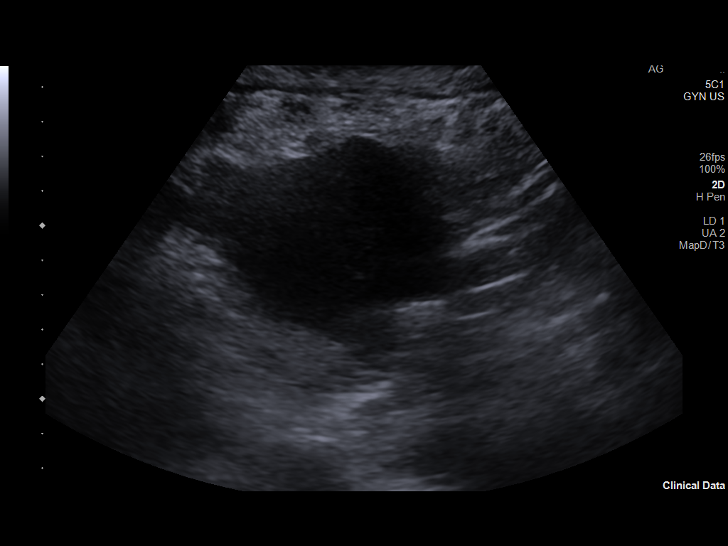
[im 28/110]
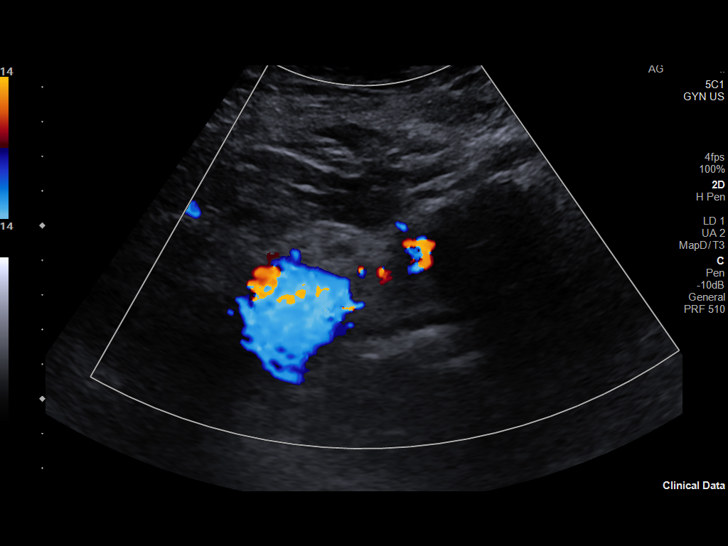
[im 37/110]
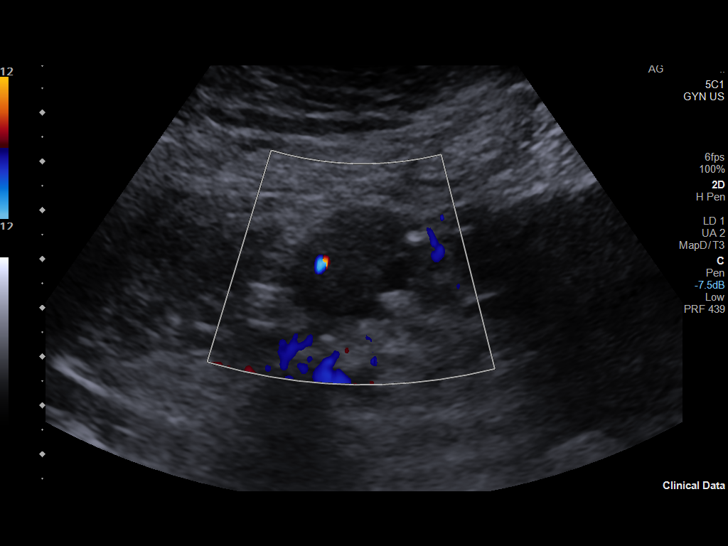
[im 41/110]
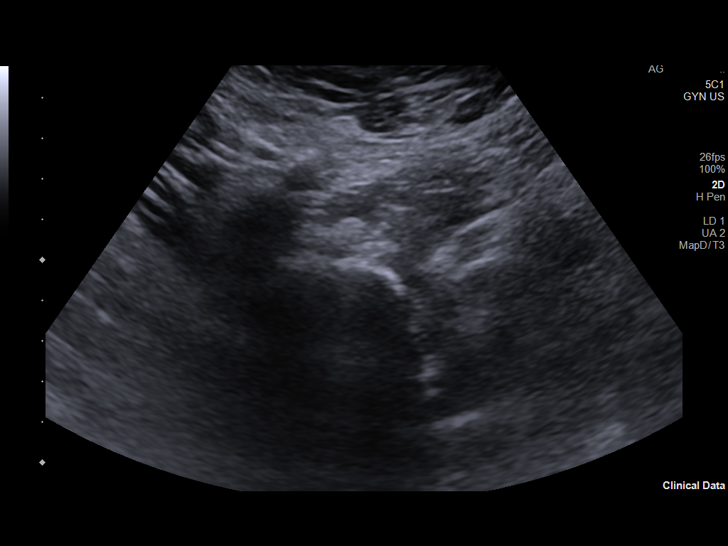
[im 50/110]
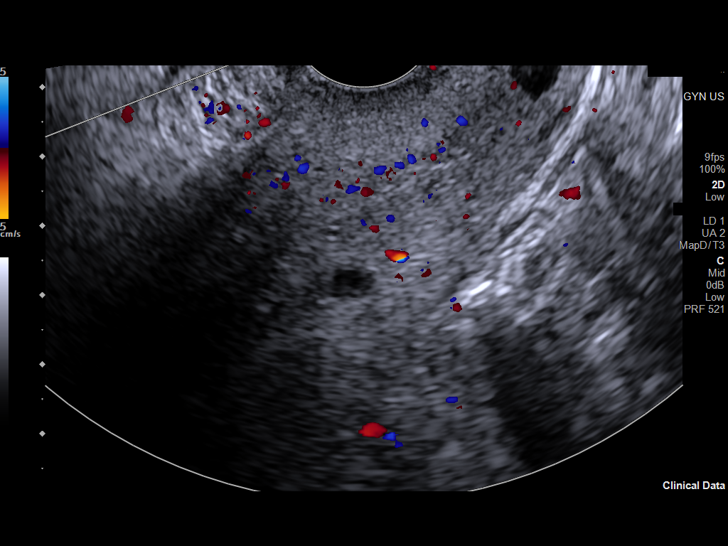
[im 60/110]
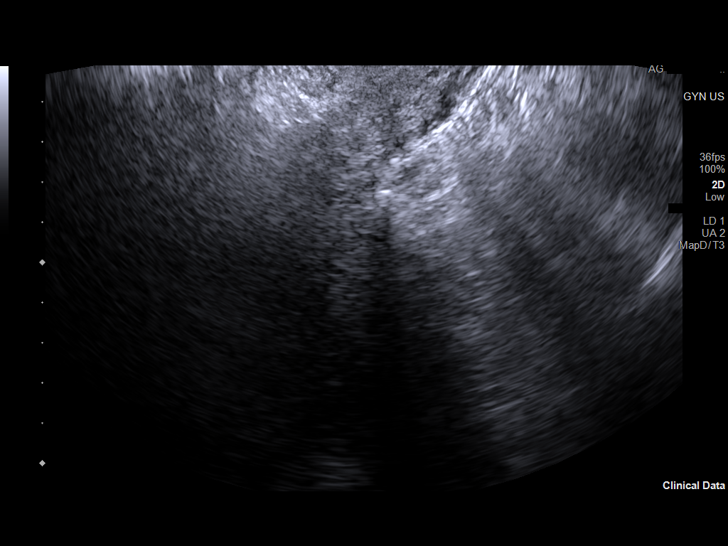
[im 69/110]
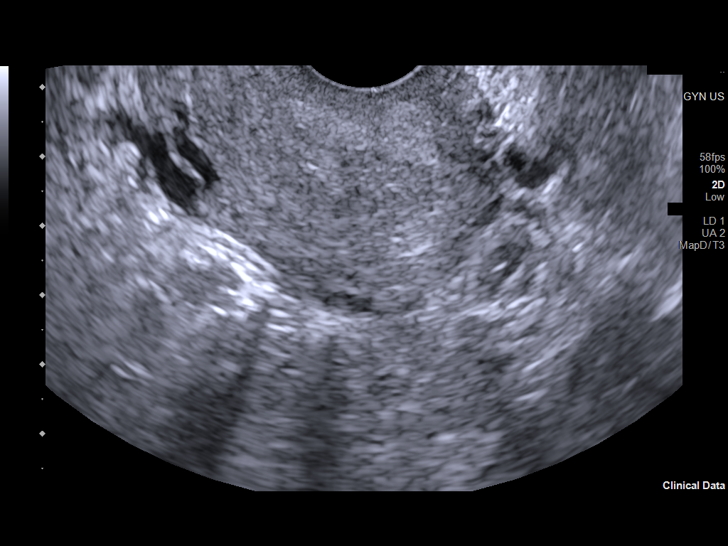
[im 73/110]
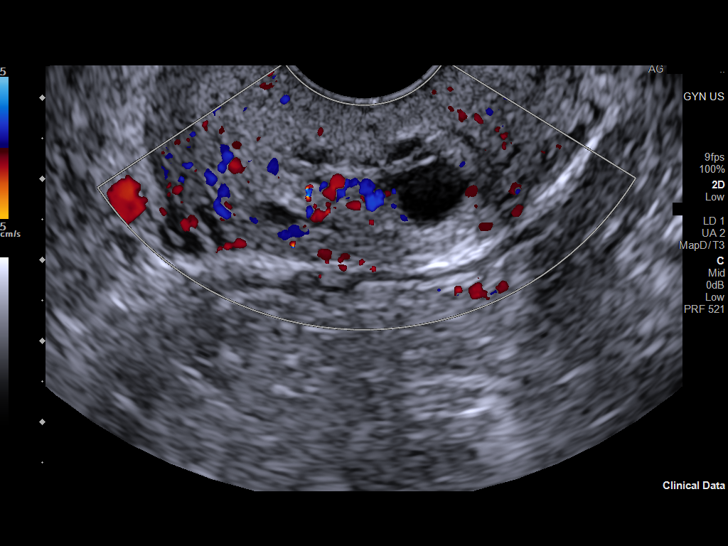
[im 82/110]
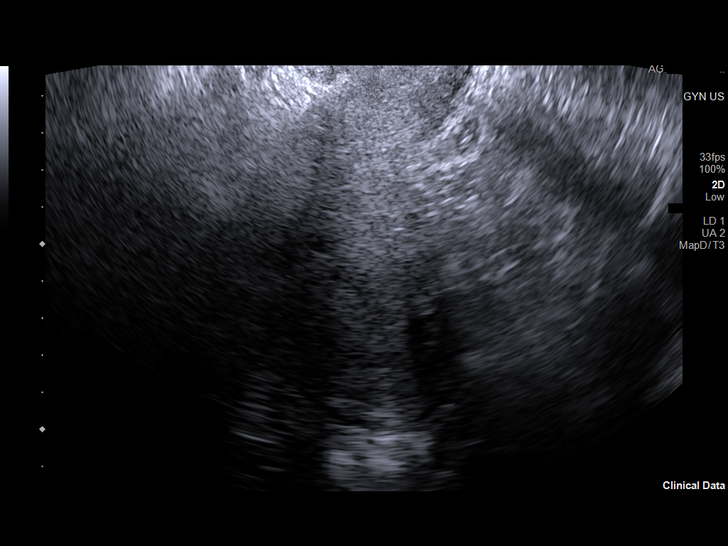
[im 91/110]
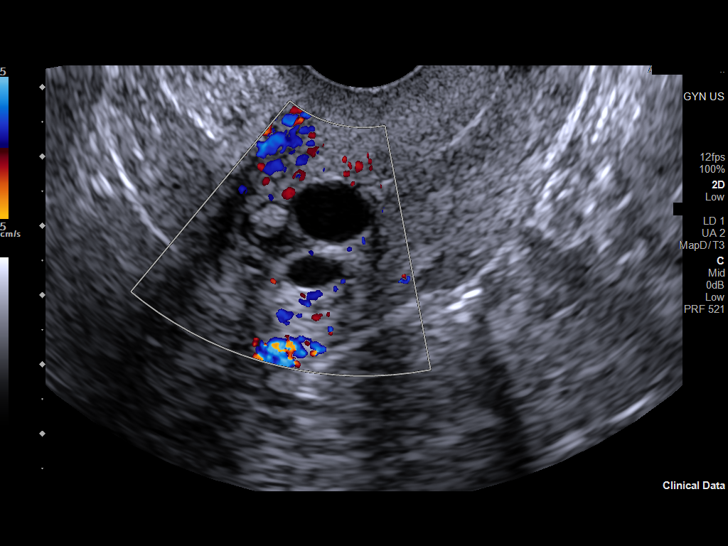
[im 100/110]
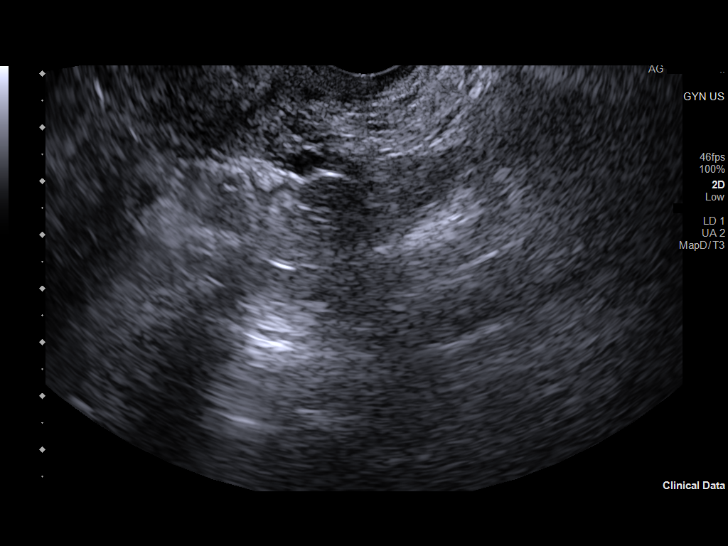
[im 110/110]
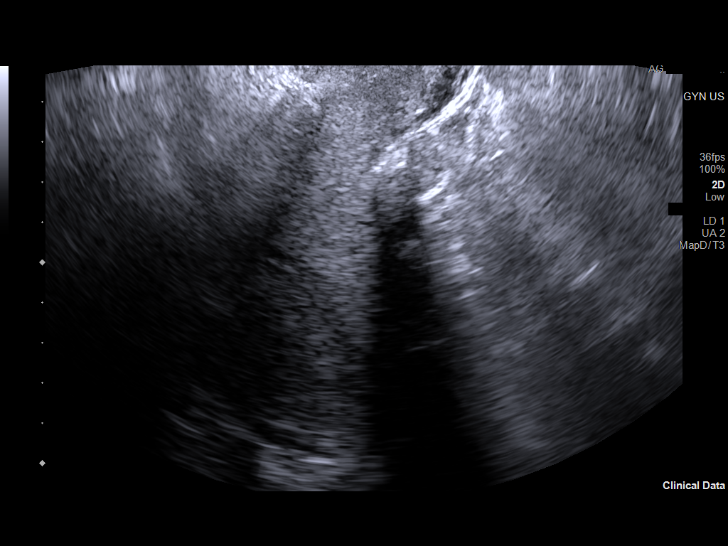

[14 of 25 positions shown; findings below may reference images not displayed]

FINDINGS: Uterus

Measurements: 10.8 x 5.8 x 6.9 cm = volume: 226 mL. Visualization is
limited due to body habitus. No focal fibroids.

Endometrium

Thickness: 19 mm in thickness. Cystic areas within the endometrium
measuring up to 1.9 cm. Heterogeneous appearance.

Right ovary

Measurements: 3.7 x 2.5 x 2.7 cm = volume: 13 mL. Normal
appearance/no adnexal mass.

Left ovary

Measurements: Not visualized.  No adnexal mass seen.

Other findings

No abnormal free fluid.
IMPRESSION: Heterogeneous, thickened appearance of the endometrium with several
cystic areas noted within the endometrium measuring up to 1.9 cm.

## 2020-11-09 ENCOUNTER — Ambulatory Visit: Payer: Medicaid Other

## 2020-11-09 ENCOUNTER — Ambulatory Visit: Payer: Medicaid Other | Admitting: Allergy & Immunology

## 2020-11-10 ENCOUNTER — Other Ambulatory Visit: Payer: Self-pay | Admitting: Pulmonary Disease

## 2020-11-13 ENCOUNTER — Ambulatory Visit: Payer: Medicaid Other | Admitting: Orthopaedic Surgery

## 2020-11-15 ENCOUNTER — Encounter: Payer: Self-pay | Admitting: Orthopaedic Surgery

## 2020-11-15 ENCOUNTER — Other Ambulatory Visit: Payer: Self-pay | Admitting: Internal Medicine

## 2020-11-15 ENCOUNTER — Ambulatory Visit (INDEPENDENT_AMBULATORY_CARE_PROVIDER_SITE_OTHER): Payer: Medicaid Other | Admitting: Orthopaedic Surgery

## 2020-11-15 ENCOUNTER — Other Ambulatory Visit: Payer: Self-pay

## 2020-11-15 VITALS — Ht 64.0 in | Wt 293.0 lb

## 2020-11-15 DIAGNOSIS — M5442 Lumbago with sciatica, left side: Secondary | ICD-10-CM

## 2020-11-15 DIAGNOSIS — G8929 Other chronic pain: Secondary | ICD-10-CM

## 2020-11-15 DIAGNOSIS — Z6841 Body Mass Index (BMI) 40.0 and over, adult: Secondary | ICD-10-CM | POA: Diagnosis not present

## 2020-11-15 MED ORDER — HYDROCODONE-ACETAMINOPHEN 5-325 MG PO TABS: ORAL_TABLET | ORAL | 0 refills | Status: DC

## 2020-11-15 NOTE — Progress Notes (Signed)
My back is still hurting bad.  She has lower back pain with left sided sciatica that is no improving.  She had MRI which showed:  IMPRESSION: 1. Small left foraminal disc protrusion at L3-4, contacting and potentially irritating the exiting left L3 nerve root. Finding could contribute to left lower extremity symptoms. 2. Broad central disc protrusion at L5-S1, closely approximating the descending S1 nerve roots without frank neural impingement or displacement. 3. Mild disc bulging with facet spurring at L4-5 and L5-S1 with resultant mild bilateral L4 and L5 foraminal stenosis.   I have explained the findings to her.  I will have her see neurosurgery.  I have independently reviewed the MRI.    She is using a cane.  She has lower back pain.  NV intact.  She has weak SLR on the left.  Encounter Diagnoses  Name Primary?   Chronic left-sided low back pain with left-sided sciatica Yes   Body mass index 45.0-49.9, adult (HCC)    Morbid obesity (HCC)    I have reviewed the [place] Controlled Substance Reporting System web site prior to prescribing narcotic medicine for this patient.  To see neurosurgery.  Call if any problem.  Precautions discussed.  Electronically Signed Darreld [name], MD 6/16/20229:49 AM

## 2020-11-19 ENCOUNTER — Telehealth: Payer: Self-pay

## 2020-11-19 ENCOUNTER — Other Ambulatory Visit: Payer: Self-pay | Admitting: *Deleted

## 2020-11-19 DIAGNOSIS — Z8669 Personal history of other diseases of the nervous system and sense organs: Secondary | ICD-10-CM

## 2020-11-19 NOTE — Telephone Encounter (Signed)
Referral placed for pt

## 2020-11-19 NOTE — Telephone Encounter (Signed)
Patient called the [facility] to setup her appointment, patient said they needed a new referral for her headaches.

## 2020-11-21 ENCOUNTER — Telehealth: Payer: Self-pay

## 2020-11-21 ENCOUNTER — Ambulatory Visit: Payer: Medicaid Other | Admitting: Internal Medicine

## 2020-11-21 NOTE — Telephone Encounter (Signed)
Spoke with patient, advised per Dr. Dimple Casey it would be most beneficial to do an in-person visit and re-examine patient. Patient is requesting to speak with Dr. Dimple Casey directly.

## 2020-11-21 NOTE — Telephone Encounter (Signed)
Patient called stating she is scheduled for an appointment this afternoon at 1:40 pm and is asking if this could be changed to a virtual appt by phone.  Patient states she is having difficulty getting transportation and doesn't want to reschedule due to needing medication to help with the pain.  Patient requested a return call.

## 2020-11-21 NOTE — Telephone Encounter (Signed)
FYI- I spoke with Ms. Logue about her symptoms she describes substantial worsening of her hand pain that she attributes to her rheumatoid arthritis activity.  This is a separate symptom from the back and leg pain for which she is seeing orthopedics.  She was not able to make her appointment today due to lack of transportation, she is currently denied transportation services which is causing numerous missed doctors appointments as her son is her only other transportation.  I recommended that if he has a day off next week we could make an opening or overbook a schedule to work with her since her symptoms sound pretty significant and we have discussed possibly increasing her treatment.

## 2020-11-21 NOTE — Progress Notes (Deleted)
Office Visit Note  Patient: Elizabeth Bautista             Date of Birth: 06/18/1975           MRN: [id number]             PCP: Anabel Halon, MD Referring: Anabel Halon, MD Visit Date: 11/21/2020   Subjective:  No chief complaint on file.   History of Present Illness: Elizabeth Bautista is a 45 y.o. 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 Since the last visit she saw orthopedics for evaluation of her chronic low back pain and sciatica with lumbar MRI and was referred to neurosurgery for this. ***    Previous HPI: Elizabeth Bautista is a 45 y.o. female here for evaluation and management of rheumatoid arthritis. Symptoms started at least since 2017 with hand, wrist, and knee pain and swelling periodically and worsened over time. She did not seek medication evaluation for some time due to being very busy with work and symptoms progressively increased. She was hospitalized for severe pain and swelling especially in the left wrist and knee and workup at that time was more extensive and revealed positive RA serology suspected as the cause. She saw rheumatology since 2020 and started treatment initial with prednisone that did not control symptoms well but caused a lot of weight gain. She has had chronic steroid exposure due to refractory asthma symptoms over the years. She also started hydroxychloroquine that apparently helped symptoms reasonably well initially but has worsened. She moved form [place] a year ago due to safety circumstances and has some family in the area and is continuing HCQ treatment but overall having a lot of joint pain. Currently feels swelling and heat in the joints multiple days per week and walks with either a cane or walker for her left knee pain and instability depending on how badly it is acting up. She was recently hospitalized for asthma exacerbation on 4/28 and discharged 5/2.   Labs reviewed 10/2018 RF ~30, CCP >250 ANA neg   Imaging reviewed Chest  xray 08/2020 Cardiomegaly no airspace disease   Lumbar spine xray 08/2020 No acute findings   No Rheumatology ROS completed.   PMFS History:  Patient Active Problem List   Diagnosis Date Noted   Iron deficiency anemia due to chronic blood loss 10/17/2020   Tension-type headache, not intractable 10/17/2020   Nausea 10/17/2020   Pregnancy examination or test, negative result 10/17/2020   Abnormal liver function test 10/11/2020   High risk medication use 10/11/2020   Asthma exacerbation 09/27/2020   Severe asthma with exacerbation 09/27/2020   Acute hip pain, left 09/19/2020   Sciatica 09/11/2020   Encounter for screening fecal occult blood testing 06/04/2020   Encounter for gynecological examination with Papanicolaou smear of cervix 06/04/2020   Screening examination for STD (sexually transmitted disease) 06/04/2020   Dysmenorrhea 06/04/2020   Menorrhagia with irregular cycle 06/04/2020   Anxiety and depression 06/04/2020   Chronic low back pain 05/24/2020   Physical deconditioning 05/24/2020   Asthma, moderate persistent 03/08/2020   GERD (gastroesophageal reflux disease) 03/07/2020   Anxiety 03/07/2020   History of seizures 03/07/2020   Upper airway cough syndrome 02/10/2020   Swelling of lower extremity 02/01/2020   Essential hypertension 02/01/2020   Vitamin D deficiency 02/01/2020   Type 2 diabetes mellitus with hyperglycemia, without long-term current use of insulin (HCC) 02/01/2020   Anemia 02/01/2020   Class 3 severe obesity with serious comorbidity  and body mass index (BMI) of 45.0 to 49.9 in adult [facility]) 10/22/2019   OSA on CPAP 10/18/2019   Depression 10/18/2019   Seropositive rheumatoid arthritis (HCC) 10/18/2019    Past Medical History:  Diagnosis Date   Anemia    Asthma    COVID-19 virus infection 05/16/2020   04/27/20 dx covid-19, not hospitalized   Diabetes (HCC)    on Metformin   Eczema    Edema    Heart problem    "something with the arteries on  the left side of the heart"; upcoming appt with cardiology for evaluation   HTN (hypertension)    Rheumatoid arthritis (HCC)    on Plaquenil    Seizures (HCC)    last seizure 2019   Sleep apnea    Sleep apnea     Family History  Problem Relation Age of Onset   High blood pressure Mother    Rheum arthritis Mother    Diabetes Father    Diabetes Sister    Heart Problems Sister    Diabetes Brother    Diabetes Paternal Grandmother    Diabetes Other        father's side "everybody died from Diabetes"   High blood pressure Other        mother's side, multiple siblings with this    Heart attack Other        family member on mother's side    Diabetes Paternal Aunt    Seizures Cousin        not sibings to the other cousins with seizures   Breast cancer Cousin    Seizures Cousin        not sibings to the other cousins with seizures   Seizures Cousin        not sibings to the other cousins with seizures   Cervical cancer Maternal Aunt    Breast cancer Cousin    Dementia Maternal Aunt    Asthma Maternal Aunt    Heart Problems Maternal Grandmother    Diabetes Brother    Asthma Daughter    Angioedema Daughter    Asthma Son    Past Surgical History:  Procedure Laterality Date   APPENDECTOMY     CESAREAN SECTION     x3   Social History   Social History Narrative   Divorced.Lives with 3 kids.Originally from [place].Came from [place] ,[place] 7 months ago.      02/27/2020   Right handed   Caffeine: none     There is no immunization history on file for this patient.   Objective: Vital Signs: There were no vitals taken for this visit.   Physical Exam   Musculoskeletal Exam:   CDAI Exam: CDAI Score: -- Patient Global: --; Provider Global: -- Swollen: --; Tender: -- Joint Exam 11/21/2020   No joint exam has been documented for this visit   There is currently no information documented on the homunculus. Go to the Rheumatology activity and complete the homunculus joint  exam.  Investigation: No additional findings.  Imaging: MR Lumbar Spine Wo Contrast  Result Date: 11/07/2020 CLINICAL DATA:  Initial evaluation for severe lower back pain with left lower extremity pain. EXAM: MRI LUMBAR SPINE WITHOUT CONTRAST TECHNIQUE: Multiplanar, multisequence MR imaging of the lumbar spine was performed. No intravenous contrast was administered. COMPARISON:  Prior radiograph from 09/20/2020. FINDINGS: Segmentation: Standard. Lowest well-formed disc space labeled the L5-S1 level. Alignment: Physiologic with preservation of the normal lumbar lordosis. No listhesis. Vertebrae: Vertebral body height well  maintained without acute or chronic fracture. Bone marrow signal intensity within normal limits. No discrete or worrisome osseous lesions. No abnormal marrow edema. Conus medullaris and cauda equina: Conus extends to the L1 level. Conus and cauda equina appear normal. Paraspinal and other soft tissues: Paraspinous soft tissues within normal limits. Visualized visceral structures are normal. Disc levels: L1-2:  Unremarkable. L2-3:  Unremarkable. L3-4: Mild disc bulge. Superimposed small left foraminal disc protrusion contacts the exiting left L3 nerve root as it courses of the left neural foramen (series 8, image 17). No spinal stenosis. Foramina remain patent. L4-5: Mild disc bulge. Minimal facet spurring. No spinal stenosis. Mild bilateral L4 foraminal narrowing. L5-S1: Disc desiccation. Broad base central disc protrusion closely approximates the descending S1 nerve roots without frank neural impingement or displacement (series 8, image 26). Minimal facet spurring. No canal or lateral recess stenosis. Mild bilateral L5 foraminal stenosis. IMPRESSION: 1. Small left foraminal disc protrusion at L3-4, contacting and potentially irritating the exiting left L3 nerve root. Finding could contribute to left lower extremity symptoms. 2. Broad central disc protrusion at L5-S1, closely approximating  the descending S1 nerve roots without frank neural impingement or displacement. 3. Mild disc bulging with facet spurring at L4-5 and L5-S1 with resultant mild bilateral L4 and L5 foraminal stenosis. Electronically Signed   By: Rise Mu M.D.   On: 11/07/2020 20:59   US PELVIC COMPLETE WITH TRANSVAGINAL  Result Date: 11/08/2020 CLINICAL DATA:  Menorrhagia, dysmenorrhea EXAM: TRANSABDOMINAL AND TRANSVAGINAL ULTRASOUND OF PELVIS TECHNIQUE: Both transabdominal and transvaginal ultrasound examinations of the pelvis were performed. Transabdominal technique was performed for global imaging of the pelvis including uterus, ovaries, adnexal regions, and pelvic cul-de-sac. It was necessary to proceed with endovaginal exam following the transabdominal exam to visualize the uterus, endometrium, ovaries and adnexa. COMPARISON:  None FINDINGS: Uterus Measurements: 10.8 x 5.8 x 6.9 cm = volume: 226 mL. Visualization is limited due to body habitus. No focal fibroids. Endometrium Thickness: 19 mm in thickness. Cystic areas within the endometrium measuring up to 1.9 cm. Heterogeneous appearance. Right ovary Measurements: 3.7 x 2.5 x 2.7 cm = volume: 13 mL. Normal appearance/no adnexal mass. Left ovary Measurements: Not visualized.  No adnexal mass seen. Other findings No abnormal free fluid. IMPRESSION: Heterogeneous, thickened appearance of the endometrium with several cystic areas noted within the endometrium measuring up to 1.9 cm. Electronically Signed   By: Charlett Nose M.D.   On: 11/08/2020 08:53    Recent Labs: Lab Results  Component Value Date   WBC 8.7 10/11/2020   HGB 8.8 (L) 10/11/2020   PLT 210 10/11/2020   NA 138 10/11/2020   K 4.1 10/11/2020   CL 104 10/11/2020   CO2 25 10/11/2020   GLUCOSE 192 (H) 10/11/2020   BUN 11 10/11/2020   CREATININE 0.69 10/11/2020   BILITOT 0.3 10/11/2020   ALKPHOS 62 05/10/2020   AST 13 10/11/2020   ALT 14 10/11/2020   PROT 6.2 10/11/2020   ALBUMIN 3.5  05/10/2020   CALCIUM 8.8 10/11/2020   GFRAA 121 10/11/2020   QFTBGOLDPLUS NEGATIVE 10/11/2020    Speciality Comments: No specialty comments available.  Procedures:  No procedures performed Allergies: Phenytoin and Phenylbutazones   Assessment / Plan:     Visit Diagnoses: No diagnosis found.  ***  Orders: No orders of the defined types were placed in this encounter.  No orders of the defined types were placed in this encounter.    Follow-Up Instructions: No follow-ups on file.   Cristal Deer  Cassie Freer, MD  Note - This record has been created using Editor, commissioning.  Chart creation errors have been sought, but may not always  have been located. Such creation errors do not reflect on  the standard of medical care.

## 2020-11-22 NOTE — Telephone Encounter (Signed)
Spoke with patient, her son will get his schedule tomorrow then she will call with availability to schedule an appointment.

## 2020-11-22 NOTE — Telephone Encounter (Signed)
Attempted to contact patient to schedule appointment, LVM advising patient to call the office.

## 2020-11-23 ENCOUNTER — Ambulatory Visit: Payer: Medicaid Other

## 2020-11-26 ENCOUNTER — Other Ambulatory Visit: Payer: Self-pay

## 2020-11-26 ENCOUNTER — Telehealth: Payer: Self-pay

## 2020-11-26 DIAGNOSIS — R5381 Other malaise: Secondary | ICD-10-CM

## 2020-11-26 DIAGNOSIS — F32A Depression, unspecified: Secondary | ICD-10-CM

## 2020-11-26 DIAGNOSIS — F339 Major depressive disorder, recurrent, unspecified: Secondary | ICD-10-CM

## 2020-11-26 DIAGNOSIS — Z87898 Personal history of other specified conditions: Secondary | ICD-10-CM

## 2020-11-26 DIAGNOSIS — F419 Anxiety disorder, unspecified: Secondary | ICD-10-CM

## 2020-11-26 NOTE — Telephone Encounter (Signed)
Spoke with pt, she was supposed to have referral for Child psychotherapist. I did not see this in her recent orders so I ordered this today.

## 2020-11-26 NOTE — Telephone Encounter (Signed)
Psych referral sent. Iron has been sent. Do you know anything about a Child psychotherapist contacting her?

## 2020-11-26 NOTE — Telephone Encounter (Signed)
I do not know of any social work consult.

## 2020-11-26 NOTE — Telephone Encounter (Signed)
Needs another referral to another Psyc dr.  Laury Axon another rx for Iron sent in   Havent heard from Social worker

## 2020-11-28 ENCOUNTER — Ambulatory Visit: Payer: Medicaid Other | Admitting: Pulmonary Disease

## 2020-11-29 ENCOUNTER — Other Ambulatory Visit: Payer: Self-pay | Admitting: Internal Medicine

## 2020-11-30 ENCOUNTER — Telehealth: Payer: Self-pay | Admitting: Internal Medicine

## 2020-11-30 NOTE — Telephone Encounter (Signed)
   Telephone encounter was:  Unsuccessful.  11/30/2020 Name: Elizabeth Bautista MRN: [id number] DOB: 03-Apr-1976  Unsuccessful outbound call made today to assist with:  Transportation Needs   Outreach Attempt:  1st Attempt  A HIPAA compliant voice message was left requesting a return call.  Instructed patient to call back at [phone]. Sent patient email with transportation information as well.   Rojelio Brenner Care Guide, Embedded Care Coordination [facility], Care Management Phone: [phone] Email: [email or web address]

## 2020-12-04 ENCOUNTER — Telehealth (INDEPENDENT_AMBULATORY_CARE_PROVIDER_SITE_OTHER): Payer: Medicaid Other | Admitting: Licensed Clinical Social Worker

## 2020-12-04 ENCOUNTER — Other Ambulatory Visit: Payer: Self-pay

## 2020-12-04 DIAGNOSIS — F339 Major depressive disorder, recurrent, unspecified: Secondary | ICD-10-CM

## 2020-12-05 ENCOUNTER — Telehealth: Payer: Self-pay | Admitting: Internal Medicine

## 2020-12-05 ENCOUNTER — Telehealth: Payer: Self-pay | Admitting: Licensed Clinical Social Worker

## 2020-12-05 DIAGNOSIS — F339 Major depressive disorder, recurrent, unspecified: Secondary | ICD-10-CM

## 2020-12-05 NOTE — Progress Notes (Signed)
[facility] Follow Up Assessment  MRN: [id number] NAME: Elizabeth Bautista Date: 12/05/20  Start time: 11a End time: 1135a Total time: 35   Type of Contact: Follow up Call  Current concerns/stressors: stress   Functional Assessment:  Sleep: poor; does not sleep well at night Appetite: poor Coping ability: overwhelmed Patient taking medications as prescribed:    Current medications:  Outpatient Encounter Medications as of 12/04/2020  Medication Sig   albuterol (PROAIR HFA) 108 (90 Base) MCG/ACT inhaler 2 puffs every 4 hours as needed only  if your can't catch your breath   albuterol (PROVENTIL) (2.5 MG/3ML) 0.083% nebulizer solution Take 3 mLs (2.5 mg total) by nebulization every 6 (six) hours as needed for wheezing or shortness of breath.   amLODipine (NORVASC) 5 MG tablet Take 1 tablet (5 mg total) by mouth daily.   blood glucose meter kit and supplies Dispense based on patient and insurance preference. Use up to four times daily as directed. (FOR ICD-10 E10.9, E11.9).   Blood Pressure Monitoring (SPHYGMOMANOMETER) MISC 1 each by Does not apply route daily.   budesonide (PULMICORT) 0.5 MG/2ML nebulizer solution Take 2 mLs (0.5 mg total) by nebulization 2 (two) times daily.   busPIRone (BUSPAR) 7.5 MG tablet Take 7.5 mg by mouth 2 (two) times daily.   cetirizine (ZYRTEC) 10 MG tablet Take 1 tablet (10 mg total) by mouth daily.   Cholecalciferol 1.25 MG (50000 UT) TABS Take 1 tablet by mouth daily.   cyclobenzaprine (FLEXERIL) 10 MG tablet Take 10 mg by mouth 3 (three) times daily as needed for muscle spasms.    EPINEPHrine 0.3 mg/0.3 mL IJ SOAJ injection Inject 0.3 mg into the muscle as needed for anaphylaxis.   escitalopram (LEXAPRO) 20 MG tablet Take 1 tablet by mouth every evening.   famotidine (PEPCID) 20 MG tablet Take 20 mg by mouth daily.   ferrous sulfate 325 (65 FE) MG tablet TAKE 1 TABLET BY MOUTH TWICE A DAY   fluticasone (CUTIVATE) 0.005 % ointment Apply 1  application topically daily.   fluticasone (FLONASE) 50 MCG/ACT nasal spray SPRAY 1 SPRAY INTO BOTH NOSTRILS DAILY.   furosemide (LASIX) 20 MG tablet Take 20 mg by mouth daily.   hydrochlorothiazide (HYDRODIURIL) 25 MG tablet Take 1 tablet (25 mg total) by mouth daily. For high blood pressure.   HYDROcodone-acetaminophen (NORCO/VICODIN) 5-325 MG tablet One tablet every four hours for pain.   hydroxychloroquine (PLAQUENIL) 200 MG tablet Take 1 tablet (200 mg total) by mouth 2 (two) times daily.   hydrOXYzine (ATARAX/VISTARIL) 25 MG tablet Take 25 mg by mouth 2 (two) times daily.   ibuprofen (ADVIL) 800 MG tablet Take 1 tablet (800 mg total) by mouth every 8 (eight) hours as needed.   mirtazapine (REMERON) 30 MG tablet Take 30 mg by mouth at bedtime.   pantoprazole (PROTONIX) 40 MG tablet Take 1 tablet (40 mg total) by mouth daily. Take 30-60 min before first meal of the day   promethazine (PHENERGAN) 25 MG tablet Take 1 tablet (25 mg total) by mouth every 6 (six) hours as needed for nausea or vomiting.   QUEtiapine (SEROQUEL) 25 MG tablet Take 25 mg by mouth 2 (two) times daily.    revefenacin (YUPELRI) 175 MCG/3ML nebulizer solution Take 3 mLs (175 mcg total) by nebulization daily.   SUMAtriptan (IMITREX) 50 MG tablet Take 1 tablet (50 mg total) by mouth every 2 (two) hours as needed for migraine or headache. May repeat in 2 hours if headache persists  or recurs.   triamcinolone (KENALOG) 0.025 % ointment Apply 1 application topically 2 (two) times daily.   triamcinolone ointment (KENALOG) 0.5 % Apply 1 application topically as needed.   No facility-administered encounter medications on file as of 12/04/2020.    Self-harm and/or Suicidal Behaviors Risk Assessment Self-harm risk factors: no Patient endorses recent self injurious thoughts and/or behaviors: No   Suicide ideations: No plan to harm self or others   Danger to Others Risk Assessment Danger to others risk factors: no Patient  endorses recent thoughts of harming others: No    Substance Use Assessment Patient recently consumed alcohol: No  Patient recently used drugs: No  Patient is concerned about dependence or abuse of substances: No    Goals, Interventions and Follow-up Plan Goals: Increase healthy adjustment to current life circumstances Interventions: Mindfulness or Relaxation Training   Summary of Clinical Assessment  Elizabeth Bautista is a 45 yr old woman referred by her PCP, Dr. Posey Pronto.  Patient reports that she did not like the Provider that she was referred to [employer].  She is requesting a referral to another provider.  She declines [facility] stating that she does not like that agency as well.  Discussion of her expectations in therapy.  Reviewed with her relaxation techniques to reduce stress induced seizures. Patient reports that she is fearful that people will not believe her and her medical problems due to a recent ED visit and the physician stating, "It is all in your head." Patient is open to weekly [facility] calls at this time.   Follow-up Plan:  weekly [facility] sessions until referred to community mental health agency [facility], LCSW

## 2020-12-05 NOTE — BH Specialist Note (Signed)
Virtual Behavioral Health Treatment Plan Team Note  MRN: [id number] NAME: Elizabeth Bautista  DATE: 12/05/20  Start time:   88 pEnd time:  355 p Total time:  10 min  Total number of Virtual [facility] Treatment Team Plan encounters: 2/4  Treatment Team Attendees: Royal Piedra, LCSW; Dr. Modesta Messing, Psychiatrist  Diagnoses: No diagnosis found.  Goals, Interventions and Follow-up Plan Goals: Increase healthy adjustment to current life circumstances Interventions: Mindfulness or Relaxation Training Medication Management Recommendations: n/a; will make referral to another [facility] agency Follow-up Plan: weekly VBH sessions until referred to community mental health agency  History of the present illness Presenting Problem/Current Symptoms: continued symptoms of her diagnosis  Psychiatric History  Depression: No Anxiety: Yes Mania: No Psychosis: No PTSD symptoms: No  Past Psychiatric History/Hospitalization(s): Hospitalization for psychiatric illness: No Prior Suicide Attempts: No Prior Self-injurious behavior: No  Psychosocial stressors unknown  Self-harm Behaviors Risk Assessment   Screenings PHQ-9 Assessments:  Depression screen [facility] 2/9 11/01/2020 09/26/2020 09/19/2020  Decreased Interest 0 2 0  Down, Depressed, Hopeless 3 2 0  PHQ - 2 Score 3 4 0  Altered sleeping 3 2 -  Tired, decreased energy 3 2 -  Change in appetite 0 2 -  Feeling bad or failure about yourself  0 2 -  Trouble concentrating 3 2 -  Moving slowly or fidgety/restless 3 1 -  Suicidal thoughts 0 0 -  PHQ-9 Score 15 15 -  Difficult doing work/chores Somewhat difficult Somewhat difficult Not difficult at all   GAD-7 Assessments:  GAD 7 : Generalized Anxiety Score 09/26/2020 06/04/2020  Nervous, Anxious, on Edge 2 3  Control/stop worrying 3 3  Worry too much - different things 3 3  Trouble relaxing 2 3  Restless 2 3  Easily annoyed or irritable 3 3  Afraid - awful might happen 3 3  Total GAD 7 Score 18 21  Anxiety  Difficulty Very difficult -    Past Medical History Past Medical History:  Diagnosis Date   Anemia    Asthma    COVID-19 virus infection 05/16/2020   04/27/20 dx covid-19, not hospitalized   Diabetes ([place])    on Metformin   Eczema    Edema    Heart problem    "something with the arteries on the left side of the heart"; upcoming appt with cardiology for evaluation   HTN (hypertension)    Rheumatoid arthritis ([place])    on Plaquenil    Seizures ([place])    last seizure 2019   Sleep apnea    Sleep apnea     Vital signs: There were no vitals filed for this visit.  Allergies:  Allergies as of 12/05/2020 - Review Complete 11/15/2020  Allergen Reaction Noted   Phenytoin Anaphylaxis 10/18/2019   Phenylbutazones  10/18/2019    Medication History Current medications:  Outpatient Encounter Medications as of 12/05/2020  Medication Sig   albuterol (PROAIR HFA) 108 (90 Base) MCG/ACT inhaler 2 puffs every 4 hours as needed only  if your can't catch your breath   albuterol (PROVENTIL) (2.5 MG/3ML) 0.083% nebulizer solution Take 3 mLs (2.5 mg total) by nebulization every 6 (six) hours as needed for wheezing or shortness of breath.   amLODipine (NORVASC) 5 MG tablet Take 1 tablet (5 mg total) by mouth daily.   blood glucose meter kit and supplies Dispense based on patient and insurance preference. Use up to four times daily as directed. (FOR ICD-10 E10.9, E11.9).   Blood Pressure Monitoring (SPHYGMOMANOMETER) MISC 1  each by Does not apply route daily.   budesonide (PULMICORT) 0.5 MG/2ML nebulizer solution Take 2 mLs (0.5 mg total) by nebulization 2 (two) times daily.   busPIRone (BUSPAR) 7.5 MG tablet Take 7.5 mg by mouth 2 (two) times daily.   cetirizine (ZYRTEC) 10 MG tablet Take 1 tablet (10 mg total) by mouth daily.   Cholecalciferol 1.25 MG (50000 UT) TABS Take 1 tablet by mouth daily.   cyclobenzaprine (FLEXERIL) 10 MG tablet Take 10 mg by mouth 3 (three) times daily as needed for muscle  spasms.    EPINEPHrine 0.3 mg/0.3 mL IJ SOAJ injection Inject 0.3 mg into the muscle as needed for anaphylaxis.   escitalopram (LEXAPRO) 20 MG tablet Take 1 tablet by mouth every evening.   famotidine (PEPCID) 20 MG tablet Take 20 mg by mouth daily.   ferrous sulfate 325 (65 FE) MG tablet TAKE 1 TABLET BY MOUTH TWICE A DAY   fluticasone (CUTIVATE) 0.005 % ointment Apply 1 application topically daily.   fluticasone (FLONASE) 50 MCG/ACT nasal spray SPRAY 1 SPRAY INTO BOTH NOSTRILS DAILY.   furosemide (LASIX) 20 MG tablet Take 20 mg by mouth daily.   gabapentin (NEURONTIN) 100 MG capsule Take 2 capsules (200 mg total) by mouth 3 (three) times daily as needed (for left leg nerve pain).   hydrochlorothiazide (HYDRODIURIL) 25 MG tablet Take 1 tablet (25 mg total) by mouth daily. For high blood pressure.   HYDROcodone-acetaminophen (NORCO/VICODIN) 5-325 MG tablet One tablet every four hours for pain.   hydroxychloroquine (PLAQUENIL) 200 MG tablet Take 1 tablet (200 mg total) by mouth 2 (two) times daily.   hydrOXYzine (ATARAX/VISTARIL) 25 MG tablet Take 25 mg by mouth 2 (two) times daily.   ibuprofen (ADVIL) 800 MG tablet Take 1 tablet (800 mg total) by mouth every 8 (eight) hours as needed.   levETIRAcetam (KEPPRA) 500 MG tablet Take 1 tablet (500 mg total) by mouth 2 (two) times daily.   mirtazapine (REMERON) 30 MG tablet Take 30 mg by mouth at bedtime.   montelukast (SINGULAIR) 10 MG tablet Take 1 tablet (10 mg total) by mouth at bedtime.   pantoprazole (PROTONIX) 40 MG tablet Take 1 tablet (40 mg total) by mouth daily. Take 30-60 min before first meal of the day   promethazine (PHENERGAN) 25 MG tablet Take 1 tablet (25 mg total) by mouth every 6 (six) hours as needed for nausea or vomiting.   QUEtiapine (SEROQUEL) 25 MG tablet Take 25 mg by mouth 2 (two) times daily.    revefenacin (YUPELRI) 175 MCG/3ML nebulizer solution Take 3 mLs (175 mcg total) by nebulization daily.   SUMAtriptan (IMITREX) 50  MG tablet Take 1 tablet (50 mg total) by mouth every 2 (two) hours as needed for migraine or headache. May repeat in 2 hours if headache persists or recurs.   triamcinolone (KENALOG) 0.025 % ointment Apply 1 application topically 2 (two) times daily.   triamcinolone ointment (KENALOG) 0.5 % Apply 1 application topically as needed.   No facility-administered encounter medications on file as of 12/05/2020.     Scribe for Treatment Team: Lubertha South, LCSW

## 2020-12-05 NOTE — Telephone Encounter (Signed)
   Telephone encounter was:  Unsuccessful.  12/05/2020 Name: Elizabeth Bautista MRN: [id number] DOB: 01-23-1976  Unsuccessful outbound call made today to assist with:  Transportation Needs   Outreach Attempt:  2nd Attempt  A HIPAA compliant voice message was left requesting a return call.  Instructed patient to call back at [phone].  Rojelio Brenner Care Guide, Embedded Care Coordination [facility], Care Management Phone: [phone] Email: [email or web address]

## 2020-12-11 ENCOUNTER — Telehealth: Payer: Self-pay | Admitting: Internal Medicine

## 2020-12-11 ENCOUNTER — Ambulatory Visit: Payer: Medicaid Other | Admitting: Pulmonary Disease

## 2020-12-11 NOTE — Telephone Encounter (Signed)
   Telephone encounter was:  Unsuccessful.  12/11/2020 Name: Elizabeth Bautista MRN: [id number] DOB: May 10, 1976  Unsuccessful outbound call made today to assist with:  Transportation Needs   Outreach Attempt:  3rd Attempt.  Referral closed unable to contact patient.  A HIPAA compliant voice message was left requesting a return call.  Instructed patient to call back at [phone].  Rojelio Brenner Care Guide, Embedded Care Coordination [facility], Care Management Phone: [phone] Email: [email or web address]

## 2020-12-17 ENCOUNTER — Other Ambulatory Visit: Payer: Self-pay | Admitting: *Deleted

## 2020-12-17 DIAGNOSIS — G43809 Other migraine, not intractable, without status migrainosus: Secondary | ICD-10-CM

## 2020-12-17 MED ORDER — SUMATRIPTAN SUCCINATE 50 MG PO TABS: 50.0000 mg | ORAL_TABLET | ORAL | 0 refills | Status: DC | PRN

## 2020-12-19 ENCOUNTER — Telehealth (INDEPENDENT_AMBULATORY_CARE_PROVIDER_SITE_OTHER): Payer: Medicaid Other | Admitting: Licensed Clinical Social Worker

## 2020-12-19 ENCOUNTER — Other Ambulatory Visit: Payer: Self-pay

## 2020-12-19 DIAGNOSIS — F32A Depression, unspecified: Secondary | ICD-10-CM

## 2020-12-19 DIAGNOSIS — F419 Anxiety disorder, unspecified: Secondary | ICD-10-CM

## 2020-12-22 ENCOUNTER — Other Ambulatory Visit: Payer: Self-pay | Admitting: Internal Medicine

## 2020-12-22 DIAGNOSIS — I10 Essential (primary) hypertension: Secondary | ICD-10-CM

## 2020-12-22 DIAGNOSIS — M7989 Other specified soft tissue disorders: Secondary | ICD-10-CM

## 2020-12-24 DIAGNOSIS — Z0271 Encounter for disability determination: Secondary | ICD-10-CM

## 2020-12-24 NOTE — Progress Notes (Signed)
Office Visit Note  Patient: Elizabeth Bautista             Date of Birth: Oct 30, 1975           MRN: [id number]             PCP: Anabel Halon, MD Referring: Anabel Halon, MD Visit Date: 12/25/2020   Subjective:  Follow-up (Patient is taking PLQ 400 mg daily and feels as if her symptoms are not well controlled. )   History of Present Illness: Elizabeth Bautista is a 45 y.o. 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 She does not feel the medication has controlled symptoms well she has persistent stiffness in her hands and knees in the mornings and knee pain persists throughout the day. Her wrist has decreased in swelling. Her left shoulder is painful with decreased range of movement limited by pain. She has had asthma exacerbations treated with prednisone but currently trying to avoid this due to upcoming bariatric surgery plans.  Previous HPI: 10/11/20 Elizabeth Bautista is a 45 y.o. female here for evaluation and management of rheumatoid arthritis. Symptoms started at least since 2017 with hand, wrist, and knee pain and swelling periodically and worsened over time. She did not seek medication evaluation for some time due to being very busy with work and symptoms progressively increased. She was hospitalized for severe pain and swelling especially in the left wrist and knee and workup at that time was more extensive and revealed positive RA serology suspected as the cause. She saw rheumatology since 2020 and started treatment initial with prednisone that did not control symptoms well but caused a lot of weight gain. She has had chronic steroid exposure due to refractory asthma symptoms over the years. She also started hydroxychloroquine that apparently helped symptoms reasonably well initially but has worsened. She moved form [place] a year ago due to safety circumstances and has some family in the area and is continuing HCQ treatment but overall having a lot of joint  pain. Currently feels swelling and heat in the joints multiple days per week and walks with either a cane or walker for her left knee pain and instability depending on how badly it is acting up. She was recently hospitalized for asthma exacerbation on 4/28 and discharged 5/2.   Labs reviewed 10/2018 RF ~30, CCP >250 ANA neg   Imaging reviewed Chest xray 08/2020 Cardiomegaly no airspace disease   Lumbar spine xray 08/2020 No acute findings  Review of Systems  Constitutional:  Positive for fatigue.  HENT:  Positive for mouth dryness. Negative for mouth sores and nose dryness.   Eyes:  Positive for visual disturbance and dryness. Negative for pain and itching.  Respiratory:  Positive for cough and shortness of breath. Negative for hemoptysis and difficulty breathing.   Cardiovascular:  Positive for chest pain and swelling in legs/feet. Negative for palpitations.  Gastrointestinal:  Negative for abdominal pain, blood in stool, constipation and diarrhea.  Endocrine: Negative for increased urination.  Genitourinary:  Negative for painful urination.  Musculoskeletal:  Positive for joint pain, joint pain, joint swelling, myalgias, muscle weakness, morning stiffness, muscle tenderness and myalgias.  Skin:  Positive for rash. Negative for color change and redness.  Allergic/Immunologic: Negative for susceptible to infections.  Neurological:  Positive for dizziness, numbness, headaches and memory loss. Negative for weakness.  Hematological:  Negative for swollen glands.  Psychiatric/Behavioral:  Positive for sleep disturbance. Negative for confusion.  PMFS History:  Patient Active Problem List   Diagnosis Date Noted   Iron deficiency anemia due to chronic blood loss 10/17/2020   Tension-type headache, not intractable 10/17/2020   Nausea 10/17/2020   Pregnancy examination or test, negative result 10/17/2020   Abnormal liver function test 10/11/2020   High risk medication use 10/11/2020    Asthma exacerbation 09/27/2020   Severe asthma with exacerbation 09/27/2020   Acute hip pain, left 09/19/2020   Sciatica 09/11/2020   Encounter for screening fecal occult blood testing 06/04/2020   Encounter for gynecological examination with Papanicolaou smear of cervix 06/04/2020   Screening examination for STD (sexually transmitted disease) 06/04/2020   Dysmenorrhea 06/04/2020   Menorrhagia with irregular cycle 06/04/2020   Anxiety and depression 06/04/2020   Chronic low back pain 05/24/2020   Physical deconditioning 05/24/2020   Asthma, moderate persistent 03/08/2020   GERD (gastroesophageal reflux disease) 03/07/2020   Anxiety 03/07/2020   History of seizures 03/07/2020   Upper airway cough syndrome 02/10/2020   Swelling of lower extremity 02/01/2020   Essential hypertension 02/01/2020   Vitamin D deficiency 02/01/2020   Type 2 diabetes mellitus with hyperglycemia, without long-term current use of insulin (HCC) 02/01/2020   Anemia 02/01/2020   Class 3 severe obesity with serious comorbidity and body mass index (BMI) of 45.0 to 49.9 in adult (HCC) 10/22/2019   OSA on CPAP 10/18/2019   Depression 10/18/2019   Seropositive rheumatoid arthritis (HCC) 10/18/2019    Past Medical History:  Diagnosis Date   Anemia    Asthma    COVID-19 virus infection 05/16/2020   04/27/20 dx covid-19, not hospitalized   Diabetes (HCC)    on Metformin   Eczema    Edema    Heart problem    "something with the arteries on the left side of the heart"; upcoming appt with cardiology for evaluation   HTN (hypertension)    Rheumatoid arthritis (HCC)    on Plaquenil    Seizures (HCC)    last seizure 2019   Sleep apnea    Sleep apnea     Family History  Problem Relation Age of Onset   High blood pressure Mother    Rheum arthritis Mother    Diabetes Father    Diabetes Sister    Heart Problems Sister    Diabetes Brother    Diabetes Paternal Grandmother    Diabetes Other        father's side  "everybody died from Diabetes"   High blood pressure Other        mother's side, multiple siblings with this    Heart attack Other        family member on mother's side    Diabetes Paternal Aunt    Seizures Cousin        not sibings to the other cousins with seizures   Breast cancer Cousin    Seizures Cousin        not sibings to the other cousins with seizures   Seizures Cousin        not sibings to the other cousins with seizures   Cervical cancer Maternal Aunt    Breast cancer Cousin    Dementia Maternal Aunt    Asthma Maternal Aunt    Heart Problems Maternal Grandmother    Diabetes Brother    Asthma Daughter    Angioedema Daughter    Asthma Son    Past Surgical History:  Procedure Laterality Date   APPENDECTOMY  CESAREAN SECTION     x3   Social History   Social History Narrative   Divorced.Lives with 3 kids.Originally from [place].Came from [place] ,[place] 7 months ago.      02/27/2020   Right handed   Caffeine: none     There is no immunization history on file for this patient.   Objective: Vital Signs: BP 132/75 (BP Location: Left Arm, Patient Position: Sitting, Cuff Size: Large)   Pulse 91   Ht 5\' 4"  (1.626 m)   Wt 285 lb (129.3 kg)   BMI 48.92 kg/m    Physical Exam Constitutional:      Appearance: She is obese.  Cardiovascular:     Rate and Rhythm: Normal rate and regular rhythm.  Pulmonary:     Effort: Pulmonary effort is normal.     Breath sounds: Wheezing present.  Skin:    General: Skin is warm and dry.     Findings: No rash.  Neurological:     Mental Status: She is alert.     Musculoskeletal Exam:  Left knee medial joint tenderness to palpation Left shoulder pain with overhead abduction and with palpation Left hand MCPs 2-3 swelling without tenderness  CDAI Exam: CDAI Score: 11  Patient Global: 50 mm; Provider Global: 10 mm Swollen: 3 ; Tender: 2  Joint Exam 12/25/2020      Right  Left  Glenohumeral      Tender  MCP 2     Swollen    MCP 3     Swollen   Knee     Swollen Tender     Investigation: No additional findings.  Imaging: No results found.  Recent Labs: Lab Results  Component Value Date   WBC 7.9 12/25/2020   HGB 8.4 (L) 12/25/2020   PLT 301 12/25/2020   NA 139 12/25/2020   K 4.1 12/25/2020   CL 105 12/25/2020   CO2 25 12/25/2020   GLUCOSE 144 (H) 12/25/2020   BUN 8 12/25/2020   CREATININE 0.70 12/25/2020   BILITOT 0.3 12/25/2020   ALKPHOS 62 05/10/2020   AST 16 12/25/2020   ALT 13 12/25/2020   PROT 6.4 12/25/2020   ALBUMIN 3.5 05/10/2020   CALCIUM 8.9 12/25/2020   GFRAA 121 10/11/2020   QFTBGOLDPLUS NEGATIVE 10/11/2020    Speciality Comments: No specialty comments available.  Procedures:  No procedures performed Allergies: Phenytoin, Pecan nut (diagnostic), and Phenylbutazones   Assessment / Plan:     Visit Diagnoses: Seropositive rheumatoid arthritis (HCC) - Plan: CBC with Differential/Platelet, COMPLETE METABOLIC PANEL WITH GFR, C-reactive protein  Seropositive RA there are several swollen joints on exam today as well as the chronic lower extremity edema and tenderness.  She has not had a large improvement on the hydroxychloroquine and is not a good candidate for antimetabolite therapy with previous abnormal liver functions so discussed starting Enbrel as a biologic DMARD monotherapy.  Continue hydroxychloroquine 400 mg p.o. daily.  Discussed risks for side effects such as increased infections, injection site reactions, and long-term use risks of lymphomas and nonmelanoma skin cancers.  Checking CBC CMP CRP and HIV antibody we will plan to start Enbrel 50 mg subcu weekly for arthritis.  High risk medication use - Plan: HIV Antibody (routine testing w rflx) Abnormal liver function test - Plan: COMPLETE METABOLIC PANEL WITH GFR  Checking baseline labs for medication safety monitoring for TNF inhibitor.  Severe persistent asthma with exacerbation  Severe persistent asthma she  currently has mild expiratory wheezes but a normal  work of breathing is trying to control this with inhalers and maintenance regimen avoiding oral prednisone.  Orders: Orders Placed This Encounter  Procedures   CBC with Differential/Platelet   COMPLETE METABOLIC PANEL WITH GFR   C-reactive protein   HIV Antibody (routine testing w rflx)   PLATELET ESTIMATION   CBC MORPHOLOGY    No orders of the defined types were placed in this encounter.    Follow-Up Instructions: No follow-ups on file.   Fuller Plan, MD  Note - This record has been created using [employer].  Chart creation errors have been sought, but may not always  have been located. Such creation errors do not reflect on  the standard of medical care.

## 2020-12-25 ENCOUNTER — Ambulatory Visit: Payer: Medicaid Other | Admitting: Internal Medicine

## 2020-12-25 ENCOUNTER — Encounter: Payer: Self-pay | Admitting: Internal Medicine

## 2020-12-25 ENCOUNTER — Other Ambulatory Visit: Payer: Self-pay

## 2020-12-25 VITALS — BP 132/75 | HR 91 | Ht 64.0 in | Wt 285.0 lb

## 2020-12-25 DIAGNOSIS — Z79899 Other long term (current) drug therapy: Secondary | ICD-10-CM

## 2020-12-25 DIAGNOSIS — R945 Abnormal results of liver function studies: Secondary | ICD-10-CM

## 2020-12-25 DIAGNOSIS — J4551 Severe persistent asthma with (acute) exacerbation: Secondary | ICD-10-CM | POA: Diagnosis not present

## 2020-12-25 DIAGNOSIS — M059 Rheumatoid arthritis with rheumatoid factor, unspecified: Secondary | ICD-10-CM

## 2020-12-25 DIAGNOSIS — R7989 Other specified abnormal findings of blood chemistry: Secondary | ICD-10-CM

## 2020-12-25 NOTE — Patient Instructions (Signed)
Etanercept Injection What is this medication? ETANERCEPT (et a NER sept) is used for the treatment of rheumatoid arthritis. The medicine is also used to treat psoriatic arthritis, ankylosing spondylitis,and psoriasis. This medicine may be used for other purposes; ask your health care provider orpharmacist if you have questions. COMMON BRAND NAME(S): Enbrel What should I tell my care team before I take this medication? They need to know if you have any of these conditions: bleeding disorder cancer diabetes granulomatosis with polyangiitis heart failure HIV or AIDs immune system problems infection such as tuberculosis (TB) or other bacterial, fungal or viral infections liver disease nervous system problems such as Guillain-Barre syndrome, multiple sclerosis or seizures recent or upcoming vaccine an unusual or allergic reaction to etanercept, other medicines, latex, rubber, food, dyes, or preservatives pregnant or trying to get pregnant breast-feeding How should I use this medication? The medicine is injected under the skin. You will be taught how to prepare and give it. Take it as directed on the prescription label. Keep taking it unlessyour health care provider tells you stop. This medicine comes with INSTRUCTIONS FOR USE. Ask your pharmacist for directions on how to use this medicine. Read the information carefully. Talk toyour pharmacist or health care provider if you have questions. If you use a pen, be sure to take off the outer needle cover before using thedose. It is important that you put your used needles and syringes in a special sharps container. Do not put them in a trash can. If you do not have a sharpscontainer, call your pharmacist or health care provider to get one. A special MedGuide will be given to you by the pharmacist with eachprescription and refill. Be sure to read this information carefully each time. Talk to your health care provider about the use of this medicine in  children. While it may be prescribed for children as young as 2 years of age for selectedconditions, precautions do apply. Overdosage: If you think you have taken too much of this medicine contact apoison control center or emergency room at once. NOTE: This medicine is only for you. Do not share this medicine with others. What if I miss a dose? If you miss a dose, take it as soon as you can. If it is almost time for yournext dose, take only that dose. Do not take double or extra doses. What may interact with this medication? Do not take this medicine with any of the following medications: biologic medicines such as adalimumab, certolizumab, golimumab, infliximab live vaccines rilonacept This medicine may also interact with the following medications: abatacept anakinra biologic medicines such as anifrolumab, baricitinib, belimumab, canakinumab, natalizumab, rituximab, sarilumab, tocilizumab, tofacitinib, upadacitinib, vedolizumab cyclophosphamide sulfasalazine This list may not describe all possible interactions. Give your health care provider a list of all the medicines, herbs, non-prescription drugs, or dietary supplements you use. Also tell them if you smoke, drink alcohol, or use illegaldrugs. Some items may interact with your medicine. What should I watch for while using this medication? Visit your health care provider for regular checks on your progress. Tell your health care provider if your symptoms do not start to get better or if they getworse. This medicine may increase your risk of getting an infection. Call your health care provider for advice if you get a fever, chills, sore throat, or other symptoms of a cold or flu. Do not treat yourself. Try to avoid being around people who are sick. If you have not had the measles or chickenpox vaccines, tell   your health care provider right away if you are around someone with theseviruses. You will be tested for tuberculosis (TB) before you start  this medicine. If your doctor prescribes any medicine for TB, you should start taking the TB medicine before starting this medicine. Make sure to finish the full course ofTB medicine. Avoid taking medicines that contain aspirin, acetaminophen, ibuprofen, naproxen, or ketoprofen unless instructed by your health care provider. Thesemedicines may hide fever. Talk to your health care provider about your risk of cancer. You may be more atrisk for certain types of cancer if you take this medicine. This medicine can decrease the response to a vaccine. If you need to get vaccinated, tell your health care provider if you have received this medicine. Extra booster doses may be needed. Talk to your health care provider to see ifa different vaccination schedule is needed. What side effects may I notice from receiving this medication? Side effects that you should report to your health care provider as soon aspossible: allergic reactions (skin rash, itching or hives; swelling of the face, lips, or tongue) changes in vision dizziness heart failure (trouble breathing; fast, irregular heartbeat; sudden weight gain; swelling of the ankles, feet, hands; unusually weak or tired) infection (fever, chills, cough, sore throat, pain or trouble passing urine) light-colored stools liver injury (dark yellow or brown urine; general ill feeling or flu-like symptoms; loss of appetite, right upper belly pain; unusually weak or tired, yellowing of the eyes or skin) low red blood cell counts (trouble breathing; feeling faint; lightheaded, falls; unusually weak or tired) pain, tingling, numbness in the hands or feet red, scaly patches or raised bumps on the skin seizures unusual bruising or bleeding weakness in the arms or legs Side effects that usually do not require medical attention (report to yourhealth care provider if they continue or are bothersome): headache nausea pain, redness, or irritation at site where  injected vomiting This list may not describe all possible side effects. Call your doctor for medical advice about side effects. You may report side effects to FDA at1-800-FDA-1088. Where should I keep my medication? Keep out of the reach of children and pets. See product for storage information. Each product may have differentinstructions. Get rid of any unused medicine after the expiration date. To get rid of medicines that are no longer needed or have expired: Take the medicine to a medicine take-back program. Check with your pharmacy or law enforcement to find a location. If you cannot return the medicine, ask your pharmacist or health care provider how to get rid of this medicine safely. NOTE: This sheet is a summary. It may not cover all possible information. If you have questions about this medicine, talk to your doctor, pharmacist, orhealth care provider.  2022 Elsevier/Gold Standard (2020-03-02 12:59:29)  

## 2020-12-26 ENCOUNTER — Other Ambulatory Visit: Payer: Self-pay | Admitting: Internal Medicine

## 2020-12-26 ENCOUNTER — Telehealth: Payer: Self-pay | Admitting: Licensed Clinical Social Worker

## 2020-12-26 DIAGNOSIS — F32A Depression, unspecified: Secondary | ICD-10-CM

## 2020-12-26 DIAGNOSIS — F419 Anxiety disorder, unspecified: Secondary | ICD-10-CM

## 2020-12-26 DIAGNOSIS — L309 Dermatitis, unspecified: Secondary | ICD-10-CM

## 2020-12-26 LAB — CBC MORPHOLOGY

## 2020-12-26 LAB — COMPLETE METABOLIC PANEL WITHOUT GFR
AG Ratio: 1.4 (calc) (ref 1.0–2.5)
ALT: 13 U/L (ref 6–29)
AST: 16 U/L (ref 10–35)
Albumin: 3.7 g/dL (ref 3.6–5.1)
Alkaline phosphatase (APISO): 78 U/L (ref 31–125)
BUN: 8 mg/dL (ref 7–25)
CO2: 25 mmol/L (ref 20–32)
Calcium: 8.9 mg/dL (ref 8.6–10.2)
Chloride: 105 mmol/L (ref 98–110)
Creat: 0.7 mg/dL (ref 0.50–0.99)
Globulin: 2.7 g/dL (ref 1.9–3.7)
Glucose, Bld: 144 mg/dL — ABNORMAL HIGH (ref 65–99)
Potassium: 4.1 mmol/L (ref 3.5–5.3)
Sodium: 139 mmol/L (ref 135–146)
Total Bilirubin: 0.3 mg/dL (ref 0.2–1.2)
Total Protein: 6.4 g/dL (ref 6.1–8.1)
eGFR: 108 mL/min/1.73m2

## 2020-12-26 LAB — CBC WITH DIFFERENTIAL/PLATELET
Absolute Monocytes: 514 cells/uL (ref 200–950)
Basophils Absolute: 47 cells/uL (ref 0–200)
Basophils Relative: 0.6 %
Eosinophils Absolute: 593 cells/uL — ABNORMAL HIGH (ref 15–500)
Eosinophils Relative: 7.5 %
HCT: 31.3 % — ABNORMAL LOW (ref 35.0–45.0)
Hemoglobin: 8.4 g/dL — ABNORMAL LOW (ref 11.7–15.5)
Lymphs Abs: 2157 cells/uL (ref 850–3900)
MCH: 18.3 pg — ABNORMAL LOW (ref 27.0–33.0)
MCHC: 26.8 g/dL — ABNORMAL LOW (ref 32.0–36.0)
MCV: 68.2 fL — ABNORMAL LOW (ref 80.0–100.0)
MPV: 10.5 fL (ref 7.5–12.5)
Monocytes Relative: 6.5 %
Neutro Abs: 4590 cells/uL (ref 1500–7800)
Neutrophils Relative %: 58.1 %
Platelets: 301 10*3/uL (ref 140–400)
RBC: 4.59 10*6/uL (ref 3.80–5.10)
RDW: 17.8 % — ABNORMAL HIGH (ref 11.0–15.0)
Total Lymphocyte: 27.3 %
WBC: 7.9 10*3/uL (ref 3.8–10.8)

## 2020-12-26 LAB — C-REACTIVE PROTEIN: CRP: 21.8 mg/L — ABNORMAL HIGH

## 2020-12-26 LAB — HIV ANTIBODY (ROUTINE TESTING W REFLEX): HIV 1&2 Ab, 4th Generation: NONREACTIVE

## 2020-12-26 NOTE — BH Specialist Note (Signed)
Virtual Behavioral Health Treatment Plan Team Note  MRN: [id number] NAME: Elizabeth Bautista  DATE: 12/26/20  Start time:   305p  End time:  310p Total time:  5 min  Total number of [facility] [place] Treatment Team Plan encounters: 3/4  Treatment Team Attendees: Royal Piedra, LCSW, Dr. Modesta Messing, Psychiatrist  Diagnoses: No diagnosis found.  Goals, Interventions and Follow-up Plan Goals: Increase healthy adjustment to current life circumstances Increase adequate support systems for patient/family Increase motivation to adhere to plan of care Interventions: Mindfulness or Relaxation Training Medication Management Recommendations: n/a refer to Psychiatry Follow-up Plan: biweekly VBh service  History of the present illness Presenting Problem/Current Symptoms:   Continued symptoms of diagnosis   Psychiatric History  Depression: Yes Anxiety: No Mania: No Psychosis: No PTSD symptoms: No  Past Psychiatric History/Hospitalization(s): Hospitalization for psychiatric illness: No Prior Suicide Attempts: No Prior Self-injurious behavior: No  Psychosocial stressors  Having seizures  Self-harm Behaviors Risk Assessment none  Screenings PHQ-9 Assessments:  Depression screen [facility] 2/9 11/01/2020 09/26/2020 09/19/2020  Decreased Interest 0 2 0  Down, Depressed, Hopeless 3 2 0  PHQ - 2 Score 3 4 0  Altered sleeping 3 2 -  Tired, decreased energy 3 2 -  Change in appetite 0 2 -  Feeling bad or failure about yourself  0 2 -  Trouble concentrating 3 2 -  Moving slowly or fidgety/restless 3 1 -  Suicidal thoughts 0 0 -  PHQ-9 Score 15 15 -  Difficult doing work/chores Somewhat difficult Somewhat difficult Not difficult at all   GAD-7 Assessments:  GAD 7 : Generalized Anxiety Score 09/26/2020 06/04/2020  Nervous, Anxious, on Edge 2 3  Control/stop worrying 3 3  Worry too much - different things 3 3  Trouble relaxing 2 3  Restless 2 3  Easily annoyed or irritable 3 3  Afraid - awful might  happen 3 3  Total GAD 7 Score 18 21  Anxiety Difficulty Very difficult -    Past Medical History Past Medical History:  Diagnosis Date   Anemia    Asthma    COVID-19 virus infection 05/16/2020   04/27/20 dx covid-19, not hospitalized   Diabetes ([place])    on Metformin   Eczema    Edema    Heart problem    "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  Allergies:  Allergies as of 12/26/2020 - Review Complete 12/25/2020  Allergen Reaction Noted   Phenytoin Anaphylaxis 10/18/2019   Pecan nut (diagnostic) Other (See Comments) 12/25/2020   Phenylbutazones  10/18/2019    Medication History Current medications:  Outpatient Encounter Medications as of 12/26/2020  Medication Sig   albuterol (PROAIR HFA) 108 (90 Base) MCG/ACT inhaler 2 puffs every 4 hours as needed only  if your can't catch your breath   albuterol (PROVENTIL) (2.5 MG/3ML) 0.083% nebulizer solution Take 3 mLs (2.5 mg total) by nebulization every 6 (six) hours as needed for wheezing or shortness of breath.   amLODipine (NORVASC) 5 MG tablet Take 1 tablet (5 mg total) by mouth daily.   blood glucose meter kit and supplies Dispense based on patient and insurance preference. Use up to four  times daily as directed. (FOR ICD-10 E10.9, E11.9).   Blood Pressure Monitoring (SPHYGMOMANOMETER) MISC 1 each by Does not apply route daily.   budesonide (PULMICORT) 0.5 MG/2ML nebulizer solution Take 2 mLs (0.5 mg total) by nebulization 2 (two) times daily.   busPIRone (BUSPAR) 7.5 MG tablet Take 7.5 mg by mouth 2 (two) times daily.   cetirizine (ZYRTEC) 10 MG tablet Take 1 tablet (10 mg total) by mouth daily.   Cholecalciferol 1.25 MG (50000 UT) TABS Take 1 tablet by mouth daily.  (Patient not taking: Reported on 12/25/2020)   cyclobenzaprine (FLEXERIL) 10 MG tablet Take 10 mg by mouth 3 (three) times daily as needed for muscle spasms.    EPINEPHrine 0.3 mg/0.3 mL IJ SOAJ injection Inject 0.3 mg into the muscle as needed for anaphylaxis.   escitalopram (LEXAPRO) 20 MG tablet Take 1 tablet by mouth every evening.   famotidine (PEPCID) 20 MG tablet Take 20 mg by mouth daily.   ferrous sulfate 325 (65 FE) MG tablet TAKE 1 TABLET BY MOUTH TWICE A DAY   fluticasone (CUTIVATE) 0.005 % ointment Apply 1 application topically daily.   fluticasone (FLONASE) 50 MCG/ACT nasal spray SPRAY 1 SPRAY INTO BOTH NOSTRILS DAILY.   furosemide (LASIX) 20 MG tablet Take 20 mg by mouth daily.   gabapentin (NEURONTIN) 100 MG capsule Take 2 capsules (200 mg total) by mouth 3 (three) times daily as needed (for left leg nerve pain).   hydrochlorothiazide (HYDRODIURIL) 25 MG tablet TAKE 1 TABLET (25 MG TOTAL) BY MOUTH DAILY. FOR HIGH BLOOD PRESSURE.   HYDROcodone-acetaminophen (NORCO/VICODIN) 5-325 MG tablet One tablet every four hours for pain.   hydroxychloroquine (PLAQUENIL) 200 MG tablet Take 1 tablet (200 mg total) by mouth 2 (two) times daily.   hydrOXYzine (ATARAX/VISTARIL) 25 MG tablet Take 25 mg by mouth 2 (two) times daily.   ibuprofen (ADVIL) 800 MG tablet Take 1 tablet (800 mg total) by mouth every 8 (eight) hours as needed.   levETIRAcetam (KEPPRA) 500 MG tablet Take 1 tablet (500 mg total) by mouth 2 (two) times daily.   mirtazapine (REMERON) 30 MG tablet Take 30 mg by mouth at bedtime.   montelukast (SINGULAIR) 10 MG tablet Take 1 tablet (10 mg total) by mouth at bedtime.   pantoprazole (PROTONIX) 40 MG tablet Take 1 tablet (40 mg total) by mouth daily. Take 30-60 min before first meal of the day   promethazine (PHENERGAN) 25 MG tablet Take 1 tablet (25 mg total) by mouth every 6 (six) hours as needed for nausea or vomiting.   QUEtiapine (SEROQUEL) 25 MG tablet Take 25 mg by mouth 2  (two) times daily.    revefenacin (YUPELRI) 175 MCG/3ML nebulizer solution Take 3 mLs (175 mcg total) by nebulization daily.   SUMAtriptan (IMITREX) 50 MG tablet Take 1 tablet (50 mg total) by mouth every 2 (two) hours as needed for migraine or headache. May repeat in 2 hours if headache persists or recurs.   triamcinolone (KENALOG) 0.025 % ointment APPLY TO AFFECTED AREA TWICE A DAY   triamcinolone ointment (KENALOG) 0.5 % Apply 1 application topically as needed.   No facility-administered encounter medications on file as of 12/26/2020.     Scribe for Treatment Team: Lubertha South, LCSW

## 2020-12-26 NOTE — Progress Notes (Signed)
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  Score cut-off points for each section are as follows: 5-9: Mild, 10-14: Moderate, 15+: Severe  PHQ-9 for Depression = 0   GAD-7 for Anxiety = 0   How difficult have these problems made it for you to do your work, take care of things at home, or get along with other people? Not at all  Functional Assessment:  Sleep: poor  Appetite: poor Coping ability: overwhelmed Patient taking medications as prescribed:  yes  Current medications:  Outpatient Encounter Medications as of 12/19/2020  Medication Sig   albuterol (PROAIR HFA) 108 (90 Base) MCG/ACT inhaler 2 puffs every 4 hours as needed only  if your can't catch your breath   albuterol (PROVENTIL) (2.5 MG/3ML) 0.083% nebulizer solution Take 3 mLs (2.5 mg total) by nebulization every 6 (six) hours as needed for wheezing or shortness of breath.   amLODipine (NORVASC) 5 MG tablet Take 1 tablet (5 mg total) by mouth daily.   blood glucose meter kit and supplies Dispense based on patient and insurance preference. Use up to four times daily as directed. (FOR ICD-10 E10.9, E11.9).   Blood Pressure Monitoring (SPHYGMOMANOMETER) MISC 1 each by Does not apply route daily.   budesonide (PULMICORT) 0.5 MG/2ML nebulizer solution Take 2 mLs (0.5 mg total) by nebulization 2 (two) times daily.   busPIRone (BUSPAR) 7.5 MG tablet Take 7.5 mg by mouth 2 (two) times daily.   cetirizine (ZYRTEC) 10 MG tablet Take 1 tablet (10 mg total) by mouth daily.   Cholecalciferol 1.25 MG (50000 UT) TABS Take 1 tablet by mouth daily. (Patient not taking: Reported on  12/25/2020)   cyclobenzaprine (FLEXERIL) 10 MG tablet Take 10 mg by mouth 3 (three) times daily as needed for muscle spasms.    EPINEPHrine 0.3 mg/0.3 mL IJ SOAJ injection Inject 0.3 mg into the muscle as needed for anaphylaxis.   escitalopram (LEXAPRO) 20 MG tablet Take 1 tablet by mouth every evening.   famotidine (PEPCID) 20 MG tablet Take 20 mg by mouth daily.   ferrous sulfate 325 (65 FE) MG tablet TAKE 1 TABLET BY MOUTH TWICE A DAY   fluticasone (CUTIVATE) 0.005 % ointment Apply 1 application topically daily.   fluticasone (FLONASE) 50 MCG/ACT nasal spray SPRAY 1 SPRAY INTO BOTH NOSTRILS DAILY.   furosemide (LASIX) 20 MG tablet Take 20 mg by mouth daily.   HYDROcodone-acetaminophen (NORCO/VICODIN) 5-325 MG tablet One tablet every four hours for pain.   hydroxychloroquine (PLAQUENIL) 200 MG tablet Take 1 tablet (200 mg total) by mouth 2 (two) times daily.   hydrOXYzine (ATARAX/VISTARIL) 25 MG tablet Take 25 mg by mouth 2 (two) times daily.   ibuprofen (ADVIL) 800 MG tablet Take 1 tablet (800 mg total) by mouth every 8 (eight) hours as needed.   mirtazapine (REMERON) 30 MG tablet Take 30 mg by mouth at bedtime.   pantoprazole (PROTONIX) 40 MG tablet Take 1 tablet (40 mg total) by mouth daily. Take 30-60 min before first meal of the day   promethazine (PHENERGAN) 25 MG tablet Take 1 tablet (25 mg total) by mouth every 6 (six) hours as needed for nausea or vomiting.  QUEtiapine (SEROQUEL) 25 MG tablet Take 25 mg by mouth 2 (two) times daily.    revefenacin (YUPELRI) 175 MCG/3ML nebulizer solution Take 3 mLs (175 mcg total) by nebulization daily.   SUMAtriptan (IMITREX) 50 MG tablet Take 1 tablet (50 mg total) by mouth every 2 (two) hours as needed for migraine or headache. May repeat in 2 hours if headache persists or recurs.   triamcinolone (KENALOG) 0.025 % ointment Apply 1 application topically 2 (two) times daily.   triamcinolone ointment (KENALOG) 0.5 % Apply 1 application topically as  needed.   [DISCONTINUED] hydrochlorothiazide (HYDRODIURIL) 25 MG tablet Take 1 tablet (25 mg total) by mouth daily. For high blood pressure.   No facility-administered encounter medications on file as of 12/19/2020.    Self-harm and/or Suicidal Behaviors Risk Assessment Self-harm risk factors: no Patient endorses recent self injurious thoughts and/or behaviors: No   Suicide ideations: No plan to harm self or others   Danger to Others Risk Assessment Danger to others risk factors: no Patient endorses recent thoughts of harming others: No    Substance Use Assessment Patient recently consumed alcohol: No  Patient recently used drugs: No  Patient is concerned about dependence or abuse of substances: No    Goals, Interventions and Follow-up Plan Goals: Increase healthy adjustment to current life circumstances, Increase adequate support systems for patient/family, and Increase motivation to adhere to plan of care Interventions: Mindfulness or Relaxation Training   Summary of Clinical Assessment  Elizabeth Bautista is a follow up for services.  Elizabeth Bautista reports continued difficulty with sleep, headaches and possible seizures.  She requested a referral to a therapist that specializes in Conversion Disorder. She became frustrated when Probation officer discussed with her mindfulness/relaxation therapy to help reduce stressors.  She reports that "doctors and therapist around here don't know nothing about my disorder, I can teach them." Normalized her feelings and discussed that we can continue to search on the "Psychology Today" website.  Attempted to provide relaxation technique, Elizabeth Bautista reports that she does not need relaation she needs help with Conversion Disorder.  Writer agreed to follow up with her and to continue searching for specialist.   Follow-up Plan:  biweekly VBh service Elizabeth South, LCSW

## 2020-12-27 ENCOUNTER — Encounter: Payer: Self-pay | Admitting: Internal Medicine

## 2020-12-28 NOTE — Progress Notes (Signed)
CRP is a marker for inflammatory signalling, this being high typically represents active inflammation or disease. Yours is increased compared to our last visit. Your red blood cell count has been decreasing slightly over time this can also sometimes indicate ongoing inflammation. I do not see any problems to start the Enbrel medication if she is okay after our discussion and reviewing the information we can proceed with this.

## 2020-12-28 NOTE — Telephone Encounter (Signed)
FYI-addressed in result note

## 2020-12-31 ENCOUNTER — Encounter: Payer: Self-pay | Admitting: Radiology

## 2021-01-01 ENCOUNTER — Other Ambulatory Visit: Payer: Self-pay | Admitting: Radiology

## 2021-01-01 DIAGNOSIS — M069 Rheumatoid arthritis, unspecified: Secondary | ICD-10-CM

## 2021-01-01 MED ORDER — HYDROXYCHLOROQUINE SULFATE 200 MG PO TABS: 200.0000 mg | ORAL_TABLET | Freq: Two times a day (BID) | ORAL | 1 refills | Status: DC

## 2021-01-01 NOTE — Progress Notes (Signed)
Yes I think waiting until afterwards would be best otherwise she would have to interrupt treatment within weeks of starting - can disregard if you already got in touch about it.

## 2021-01-01 NOTE — Telephone Encounter (Signed)
Next Visit: Nothing scheduled Last Visit: 12/25/2020  Last Fill: 02/20/2020  DX: Seropositive rheumatoid arthritis  Current Dose per office note 12/25/2020: hydroxychloroquine 400 mg p.o. daily  Labs: 12/25/2020- CBC, CMP Eye Exam: patient is scheduled  Okay to refill PLQ?

## 2021-01-02 ENCOUNTER — Other Ambulatory Visit: Payer: Self-pay

## 2021-01-02 ENCOUNTER — Telehealth (INDEPENDENT_AMBULATORY_CARE_PROVIDER_SITE_OTHER): Payer: Medicaid Other | Admitting: Licensed Clinical Social Worker

## 2021-01-02 DIAGNOSIS — F419 Anxiety disorder, unspecified: Secondary | ICD-10-CM

## 2021-01-02 DIAGNOSIS — F32A Depression, unspecified: Secondary | ICD-10-CM

## 2021-01-02 NOTE — Progress Notes (Signed)
Clinician was unable to keep this appointment; will reschedule

## 2021-01-21 ENCOUNTER — Ambulatory Visit: Payer: Medicaid Other | Admitting: Pulmonary Disease

## 2021-01-23 ENCOUNTER — Ambulatory Visit: Payer: Medicaid Other | Admitting: Internal Medicine

## 2021-02-05 DIAGNOSIS — Z6841 Body Mass Index (BMI) 40.0 and over, adult: Secondary | ICD-10-CM | POA: Insufficient documentation

## 2021-02-13 ENCOUNTER — Encounter: Payer: Self-pay | Admitting: Neurology

## 2021-02-13 ENCOUNTER — Institutional Professional Consult (permissible substitution): Payer: Medicaid Other | Admitting: Neurology

## 2021-02-13 ENCOUNTER — Ambulatory Visit (INDEPENDENT_AMBULATORY_CARE_PROVIDER_SITE_OTHER): Payer: Medicaid Other | Admitting: Licensed Clinical Social Worker

## 2021-02-13 DIAGNOSIS — F32A Depression, unspecified: Secondary | ICD-10-CM

## 2021-02-13 DIAGNOSIS — F419 Anxiety disorder, unspecified: Secondary | ICD-10-CM

## 2021-02-13 NOTE — Progress Notes (Signed)
Referred Patient to [facility] to assist with her DX of Conversion Disorder

## 2021-02-14 ENCOUNTER — Other Ambulatory Visit: Payer: Self-pay

## 2021-02-14 ENCOUNTER — Ambulatory Visit: Payer: Medicaid Other | Admitting: Internal Medicine

## 2021-02-14 ENCOUNTER — Encounter: Payer: Self-pay | Admitting: Internal Medicine

## 2021-02-14 DIAGNOSIS — Z599 Problem related to housing and economic circumstances, unspecified: Secondary | ICD-10-CM | POA: Diagnosis not present

## 2021-02-14 DIAGNOSIS — I10 Essential (primary) hypertension: Secondary | ICD-10-CM | POA: Diagnosis not present

## 2021-02-14 DIAGNOSIS — G8929 Other chronic pain: Secondary | ICD-10-CM

## 2021-02-14 DIAGNOSIS — F32A Depression, unspecified: Secondary | ICD-10-CM

## 2021-02-14 DIAGNOSIS — M5441 Lumbago with sciatica, right side: Secondary | ICD-10-CM

## 2021-02-14 DIAGNOSIS — M5442 Lumbago with sciatica, left side: Secondary | ICD-10-CM

## 2021-02-14 DIAGNOSIS — Z9114 Patient's other noncompliance with medication regimen: Secondary | ICD-10-CM

## 2021-02-14 NOTE — Patient Instructions (Signed)
You are being referred to pain clinic.  You are being referred to Child psychotherapist for medication assistance program.

## 2021-02-14 NOTE — Progress Notes (Signed)
Virtual Visit via Telephone Note   This visit type was conducted due to national recommendations for restrictions regarding the COVID-19 Pandemic (e.g. social distancing) in an effort to limit this patient's exposure and mitigate transmission in our community.  Due to her co-morbid illnesses, this patient is at least at moderate risk for complications without adequate follow up.  This format is felt to be most appropriate for this patient at this time.  The patient did not have access to video technology/had technical difficulties with video requiring transitioning to audio format only (telephone).  All issues noted in this document were discussed and addressed.  No physical exam could be performed with this format.  Evaluation Performed:  Follow-up visit  Date:  02/14/2021   ID:  Elizabeth Bautista, DOB Jul 25, 1975, MRN [id number]  Patient Location: Home Provider Location: Office/Clinic  Participants: Patient Location of Patient: Home Location of Provider: Telehealth Consent was obtain for visit to be over via telehealth. I verified that I am speaking with the correct person using two identifiers.  PCP:  [name], MD   Chief Complaint:  Back pain, headaches  History of Present Illness:    Elizabeth Bautista is a 45 y.o. female who has a televisit for follow-up of her chronic back pain, for which she was referred to spine surgery.  She states that she was offered an injection in the back, but she denied it since she was told that it may or may not help her.  She continues to have back pain, which is sharp, constant, radiating to her bilateral LE and is associated with numbness and tingling in the LE.  She is willing to see a pain specialist for it.  She had a visit with neurologist for headaches.  She has not been getting her medications because of financial constraints.  She was referred to social work in the past, but has not heard from them.  We will try to contact social worker  again.  She saw [facility] therapist for anxiety and depression, who has been trying to get her psychiatrist.  She has not had any recent visit with [facility] therapist and requests follow-up appointment.  We will try to contact [facility] therapist to have her scheduled for follow.  The patient does not have symptoms concerning for COVID-19 infection (fever, chills, cough, or new shortness of breath).   Past Medical, Surgical, Social History, Allergies, and Medications have been Reviewed.  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 side of the heart"; upcoming appt with cardiology for evaluation   HTN (hypertension)    Rheumatoid arthritis ([place])    on Plaquenil    Seizures ([place])    last seizure 2019   Sleep apnea    Sleep apnea    Past Surgical History:  Procedure Laterality Date   APPENDECTOMY     CESAREAN SECTION     x3     Current Meds  Medication Sig   albuterol (PROAIR HFA) 108 (90 Base) MCG/ACT inhaler 2 puffs every 4 hours as needed only  if your can't catch your breath   albuterol (PROVENTIL) (2.5 MG/3ML) 0.083% nebulizer solution Take 3 mLs (2.5 mg total) by nebulization every 6 (  six) hours as needed for wheezing or shortness of breath.   amLODipine (NORVASC) 5 MG tablet Take 1 tablet (5 mg total) by mouth daily.   blood glucose meter kit and supplies Dispense based on patient and insurance preference. Use up to four times daily as directed. (FOR ICD-10 E10.9, E11.9).   Blood Pressure Monitoring (SPHYGMOMANOMETER) MISC 1 each by Does not apply route daily.   budesonide (PULMICORT) 0.5 MG/2ML nebulizer solution Take 2 mLs (0.5 mg total) by nebulization 2 (two) times daily.   busPIRone (BUSPAR) 7.5 MG tablet Take 7.5 mg by mouth 2 (two) times daily.   cetirizine (ZYRTEC) 10 MG tablet  Take 1 tablet (10 mg total) by mouth daily.   Cholecalciferol 1.25 MG (50000 UT) TABS Take 1 tablet by mouth daily.   cyclobenzaprine (FLEXERIL) 10 MG tablet Take 10 mg by mouth 3 (three) times daily as needed for muscle spasms.    EPINEPHrine 0.3 mg/0.3 mL IJ SOAJ injection Inject 0.3 mg into the muscle as needed for anaphylaxis.   escitalopram (LEXAPRO) 20 MG tablet Take 1 tablet by mouth every evening.   famotidine (PEPCID) 20 MG tablet Take 20 mg by mouth daily.   ferrous sulfate 325 (65 FE) MG tablet TAKE 1 TABLET BY MOUTH TWICE A DAY   fluticasone (CUTIVATE) 0.005 % ointment Apply 1 application topically daily.   fluticasone (FLONASE) 50 MCG/ACT nasal spray SPRAY 1 SPRAY INTO BOTH NOSTRILS DAILY.   furosemide (LASIX) 20 MG tablet Take 20 mg by mouth daily.   hydrochlorothiazide (HYDRODIURIL) 25 MG tablet TAKE 1 TABLET (25 MG TOTAL) BY MOUTH DAILY. FOR HIGH BLOOD PRESSURE.   HYDROcodone-acetaminophen (NORCO/VICODIN) 5-325 MG tablet One tablet every four hours for pain.   hydroxychloroquine (PLAQUENIL) 200 MG tablet Take 1 tablet (200 mg total) by mouth 2 (two) times daily.   hydrOXYzine (ATARAX/VISTARIL) 25 MG tablet Take 25 mg by mouth 2 (two) times daily.   ibuprofen (ADVIL) 800 MG tablet Take 1 tablet (800 mg total) by mouth every 8 (eight) hours as needed.   mirtazapine (REMERON) 30 MG tablet Take 30 mg by mouth at bedtime.   pantoprazole (PROTONIX) 40 MG tablet Take 1 tablet (40 mg total) by mouth daily. Take 30-60 min before first meal of the day   promethazine (PHENERGAN) 25 MG tablet Take 1 tablet (25 mg total) by mouth every 6 (six) hours as needed for nausea or vomiting.   QUEtiapine (SEROQUEL) 25 MG tablet Take 25 mg by mouth 2 (two) times daily.    revefenacin (YUPELRI) 175 MCG/3ML nebulizer solution Take 3 mLs (175 mcg total) by nebulization daily.   SUMAtriptan (IMITREX) 50 MG tablet Take 1 tablet (50 mg total) by mouth every 2 (two) hours as needed for migraine or headache.  May repeat in 2 hours if headache persists or recurs.   triamcinolone (KENALOG) 0.025 % ointment APPLY TO AFFECTED AREA TWICE A DAY   triamcinolone ointment (KENALOG) 0.5 % Apply 1 application topically as needed.     Allergies:   Phenytoin, Pecan nut (diagnostic), and Phenylbutazones   ROS:   Please see the history of present illness.     All other systems reviewed and are negative.   Labs/Other Tests and Data Reviewed:    Recent Labs: 02/16/2020: TSH 1.050 03/13/2020: Magnesium 2.1 09/27/2020: B Natriuretic Peptide 23.0 12/25/2020: ALT 13; BUN 8; Creat 0.70; Hemoglobin 8.4; Platelets 301; Potassium 4.1; Sodium 139   Recent Lipid Panel Lab Results  Component Value Date/Time   CHOL 212 (  H) 01/18/2020 03:00 PM   TRIG 88 01/18/2020 03:00 PM   HDL 82 01/18/2020 03:00 PM   CHOLHDL 2.6 01/18/2020 03:00 PM   LDLCALC 111 (H) 01/18/2020 03:00 PM    Wt Readings from Last 3 Encounters:  12/25/20 285 lb (129.3 kg)  11/15/20 293 lb (132.9 kg)  10/17/20 293 lb (132.9 kg)     ASSESSMENT & PLAN:    HTN Has not been taking medications and states that her BP remains WNL at home (??) and that is why she does not take medications Will have to check BP in the office  Depression with anxiety Will check with [facility] therapist for follow-up Needs to see a psychiatrist Was on multiple medications in the past, has not been able to get it due to financial constraints  Chronic low back pain with sciatica Had spine surgery evaluation, denied steroid injection Willing to see a pain specialist -referral provided  Medication noncompliance due to financial constraints Had referred her to social worker in the past, will try to provide another referral for medication assistance Needs to contact social service department    Time:   Today, I have spent 18 minutes reviewing the chart, including problem list, medications, and with the patient with telehealth technology discussing the above  problems.   Medication Adjustments/Labs and Tests Ordered: Current medicines are reviewed at length with the patient today.  Concerns regarding medicines are outlined above.   Tests Ordered: Orders Placed This Encounter  Procedures   Ambulatory referral to Pain Clinic    Medication Changes: No orders of the defined types were placed in this encounter.    Note: This dictation was prepared with Dragon dictation along with smaller phrase technology. Similar sounding words can be transcribed inadequately or may not be corrected upon review. Any transcriptional errors that result from this process are unintentional.      Disposition:  Follow up  Signed, [name], MD  02/14/2021 12:41 PM     [place]

## 2021-02-22 ENCOUNTER — Other Ambulatory Visit: Payer: Self-pay

## 2021-02-22 ENCOUNTER — Encounter: Payer: Self-pay | Admitting: Pulmonary Disease

## 2021-02-22 ENCOUNTER — Ambulatory Visit (INDEPENDENT_AMBULATORY_CARE_PROVIDER_SITE_OTHER): Payer: Medicaid Other | Admitting: Pulmonary Disease

## 2021-02-22 VITALS — BP 120/80 | HR 79 | Ht 64.0 in | Wt 293.6 lb

## 2021-02-22 DIAGNOSIS — R058 Other specified cough: Secondary | ICD-10-CM | POA: Diagnosis not present

## 2021-02-22 DIAGNOSIS — J301 Allergic rhinitis due to pollen: Secondary | ICD-10-CM

## 2021-02-22 DIAGNOSIS — J4551 Severe persistent asthma with (acute) exacerbation: Secondary | ICD-10-CM

## 2021-02-22 DIAGNOSIS — G4733 Obstructive sleep apnea (adult) (pediatric): Secondary | ICD-10-CM

## 2021-02-22 DIAGNOSIS — Z9989 Dependence on other enabling machines and devices: Secondary | ICD-10-CM

## 2021-02-22 MED ORDER — TRELEGY ELLIPTA 200-62.5-25 MCG/INH IN AEPB: 1.0000 | INHALATION_SPRAY | Freq: Every day | RESPIRATORY_TRACT | 5 refills | Status: DC

## 2021-02-22 MED ORDER — AZELASTINE HCL 0.1 % NA SOLN: 1.0000 | Freq: Two times a day (BID) | NASAL | 12 refills | Status: DC

## 2021-02-22 NOTE — Progress Notes (Signed)
[facility]  Chief Complaint  Patient presents with   Follow-up    Patient reports she is about the same with her sleep and does not have any concerns.     Constitutional:  BP 120/80 (BP Location: Left Arm, Cuff Size: Normal)   Pulse 79   Ht _0  (1.626 m)   Wt 293 lb 9.6 oz (133.2 kg)   SpO2 98%   BMI 50.40 kg/m   Past Medical History:  Asthma, Anemia, Seizures, DM type 2, HTN, GERD, RA, COVID 19 infection November 2021  Past Surgical History:  Her  has a past surgical history that includes Appendectomy and Cesarean section.  Brief Summary:  Elizabeth Bautista is a 45 y.o. female former smoker with asthma, upper airway cough, laryngopharyngeal reflux, and obstructive sleep apnea.       Subjective:   She was seen recently by rheumatology.  She was told she will need to be on injection medication.  She hasn't been back to allergy office since February.  She was to start on nucala, but was scared that this could impact her immune system.  She didn't realize that repeated doses of prednisone for asthma flare ups can impact her immune system.  She was doing well recently until about a week ago.  With the season change she is getting more cough and congestion.  She has some nasal drainage.  Elizabeth Bautista has helped a lot, but she was told it isn't covered by her insurance.  She will get getting bariatric surgery at [facility].  She uses CPAP nightly without difficulty  Physical Exam:   Appearance - well kempt   ENMT - no sinus tenderness, no oral exudate, no LAN, Mallampati 4 airway, no stridor  Respiratory - equal breath sounds bilaterally, no wheezing or rales  CV - s1s2 regular rate and rhythm, no murmurs  Ext - no clubbing, no edema  Skin - no rashes  Psych - normal mood and affect     Pulmonary testing:  Spirometry 08/18/17 >> FEV1 2.03 (82%), FEV1% 73 CBC 05/10/20 >> eosinophils 0.4 K/uL Allergy test 05/30/20 >>  rye/bermuda/johnson grass, mold, hickory, dog, cat, cockroach A1AT 07/09/20 >> 147 RAST 07/09/20 >> dust mites, cats, dogs, cockroach, pecan hickory; IgE 884  Chest Imaging:  CT sinus 10/20/19 >> normal CT angio chest 05/10/20 >> airway thickening, faint patchy GGO in lunges  Sleep Tests:  PSG 05/23/20 >> AHI 6.7, SpO2 low 83%.  REM AHI 32.4. Auto CPAP 01/22/21 to 02/20/21 >> used on 24 of 30 nights with average 4 hrs 56 min.  Average AHI 0.7 with median CPAP 10 and 95 th percentile CPAP 12 cm H2O  Cardiac Tests:  Echo 01/24/20 >> EF 55 to 60%, grade 1 DD, mod LA dilation  Social History:  She  reports that she quit smoking about 7 years ago. Her smoking use included cigarettes. She has never used smokeless tobacco. She reports current alcohol use. She reports that she does not currently use drugs.  Family History:  Her family history includes Angioedema in her daughter; Asthma in her daughter, maternal aunt, and son; Breast cancer in her cousin and cousin; Cervical cancer in her maternal aunt; Dementia in her maternal aunt; Diabetes in her brother, brother, father, paternal aunt, paternal grandmother, sister, and another family member; Heart Problems in her maternal grandmother and sister; Heart attack in an other family member; High blood pressure in her mother and another family member; Rheum arthritis in  her mother; Seizures in her cousin, cousin, and cousin.     Assessment/Plan:   Severe, persistent allergic asthma. - breztri wasn't effective - yupelri is too expensive - will have her try trelegy 200 one puff daily; sample given - advised her to follow up with Dr. Gillermina Hu office to discuss whether nucala is still an option in light of rheumatology plan for injection therapy for rheumatoid arthritis - continue singulair  Obstructive sleep apnea. - she is compliant with CPAP and reports benefit from therapy - she uses [place] for her DME - continue auto CPAP 5 to 15 cm  H2O  Upper airway cough syndrome. - has perennial allergic rhinitis - followed by Dr. Ernst Bowler - continue singulair - add regular use of flonase, azelastine, cetirizine  Rheumatoid arthritis. - she has been on plaquenil - followed by Dr. [name] with [facility]  Obesity. - she will f/u at [facility] for bariatric surgery - discussed how weight loss can improve management of asthma and sleep apnea  Time Spent Involved in Patient Care on Day of Examination:  43 minutes  Follow up:   Patient Instructions  Trelegy one puff daily, and rinse your mouth after each use  Stop using pulmicort and yupelri once you start using trelegy  Flonase 1 spray in each nostril daily  Azelastine 1 spray in each nostril in the morning and in the evening  Cetirizine 10 mg pill daily  Continue singulair 10 mg pill nightly  Follow up in [place] office in 6 months  Medication List:   Allergies as of 02/22/2021       Reactions   Phenytoin Anaphylaxis   Other reaction(s): swelling and heart rate decreased   Pecan Nut (diagnostic) Other (See Comments)   Phenylbutazones Other (See Comments)        Medication List        Accurate as of February 22, 2021 10:14 AM. If you have any questions, ask your nurse or doctor.          STOP taking these medications    budesonide 0.5 MG/2ML nebulizer solution Commonly known as: PULMICORT Stopped by: Chesley Mires, MD   revefenacin 175 MCG/3ML nebulizer solution Commonly known as: YUPELRI Stopped by: Chesley Mires, MD       TAKE these medications    albuterol 108 (90 Base) MCG/ACT inhaler Commonly known as: ProAir HFA 2 puffs every 4 hours as needed only  if your can't catch your breath   albuterol (2.5 MG/3ML) 0.083% nebulizer solution Commonly known as: PROVENTIL Take 3 mLs (2.5 mg total) by nebulization every 6 (six) hours as needed for wheezing or shortness of breath.   amLODipine 5 MG tablet Commonly known as:  NORVASC Take 1 tablet (5 mg total) by mouth daily.   azelastine 0.1 % nasal spray Commonly known as: ASTELIN Place 1 spray into both nostrils 2 (two) times daily. Use in each nostril as directed Started by: Chesley Mires, MD   blood glucose meter kit and supplies Dispense based on patient and insurance preference. Use up to four times daily as directed. (FOR ICD-10 E10.9, E11.9).   busPIRone 7.5 MG tablet Commonly known as: BUSPAR Take 7.5 mg by mouth 2 (two) times daily.   cetirizine 10 MG tablet Commonly known as: ZYRTEC Take 1 tablet (10 mg total) by mouth daily.   Cholecalciferol 1.25 MG (50000 UT) Tabs Take 1 tablet by mouth daily.   cyclobenzaprine 10 MG tablet Commonly known as: FLEXERIL Take 10 mg by  mouth 3 (three) times daily as needed for muscle spasms.   EPINEPHrine 0.3 mg/0.3 mL Soaj injection Commonly known as: EPI-PEN Inject 0.3 mg into the muscle as needed for anaphylaxis.   escitalopram 20 MG tablet Commonly known as: LEXAPRO Take 1 tablet by mouth every evening.   famotidine 20 MG tablet Commonly known as: PEPCID Take 20 mg by mouth daily.   ferrous sulfate 325 (65 FE) MG tablet TAKE 1 TABLET BY MOUTH TWICE A DAY   fluticasone 0.005 % ointment Commonly known as: CUTIVATE Apply 1 application topically daily.   fluticasone 50 MCG/ACT nasal spray Commonly known as: FLONASE SPRAY 1 SPRAY INTO BOTH NOSTRILS DAILY.   furosemide 20 MG tablet Commonly known as: LASIX Take 20 mg by mouth daily.   gabapentin 100 MG capsule Commonly known as: Neurontin Take 2 capsules (200 mg total) by mouth 3 (three) times daily as needed (for left leg nerve pain).   hydrochlorothiazide 25 MG tablet Commonly known as: HYDRODIURIL TAKE 1 TABLET (25 MG TOTAL) BY MOUTH DAILY. FOR HIGH BLOOD PRESSURE.   HYDROcodone-acetaminophen 5-325 MG tablet Commonly known as: NORCO/VICODIN One tablet every four hours for pain.   hydroxychloroquine 200 MG tablet Commonly known  as: PLAQUENIL Take 1 tablet (200 mg total) by mouth 2 (two) times daily.   hydrOXYzine 25 MG tablet Commonly known as: ATARAX/VISTARIL Take 25 mg by mouth 2 (two) times daily.   ibuprofen 800 MG tablet Commonly known as: ADVIL Take 1 tablet (800 mg total) by mouth every 8 (eight) hours as needed.   levETIRAcetam 500 MG tablet Commonly known as: Keppra Take 1 tablet (500 mg total) by mouth 2 (two) times daily.   mirtazapine 30 MG tablet Commonly known as: REMERON Take 30 mg by mouth at bedtime.   montelukast 10 MG tablet Commonly known as: SINGULAIR Take 1 tablet (10 mg total) by mouth at bedtime.   pantoprazole 40 MG tablet Commonly known as: Protonix Take 1 tablet (40 mg total) by mouth daily. Take 30-60 min before first meal of the day   promethazine 25 MG tablet Commonly known as: PHENERGAN Take 1 tablet (25 mg total) by mouth every 6 (six) hours as needed for nausea or vomiting.   QUEtiapine 25 MG tablet Commonly known as: SEROQUEL Take 25 mg by mouth 2 (two) times daily.   Sphygmomanometer Misc 1 each by Does not apply route daily.   SUMAtriptan 50 MG tablet Commonly known as: Imitrex Take 1 tablet (50 mg total) by mouth every 2 (two) hours as needed for migraine or headache. May repeat in 2 hours if headache persists or recurs.   Trelegy Ellipta 200-62.5-25 MCG/INH Aepb Generic drug: Fluticasone-Umeclidin-Vilant Inhale 1 puff into the lungs daily. Started by: Chesley Mires, MD   triamcinolone ointment 0.5 % Commonly known as: KENALOG Apply 1 application topically as needed.   triamcinolone 0.025 % ointment Commonly known as: KENALOG APPLY TO AFFECTED AREA TWICE A DAY        Signature:  Chesley Mires, MD [place] Pager - [phone] 02/22/2021, 10:14 AM

## 2021-02-22 NOTE — Patient Instructions (Signed)
Trelegy one puff daily, and rinse your mouth after each use  Stop using pulmicort and yupelri once you start using trelegy  Flonase 1 spray in each nostril daily  Azelastine 1 spray in each nostril in the morning and in the evening  Cetirizine 10 mg pill daily  Continue singulair 10 mg pill nightly  Follow up in [place] office in 6 months

## 2021-03-04 ENCOUNTER — Encounter: Payer: Self-pay | Admitting: Physical Medicine & Rehabilitation

## 2021-03-06 LAB — HM DIABETES EYE EXAM

## 2021-04-01 NOTE — Progress Notes (Signed)
She doesn't have one scheduled. She canceled the last two visits. When do you want her to follow back up?   Thanks

## 2021-04-03 ENCOUNTER — Emergency Department (HOSPITAL_COMMUNITY)
Admission: EM | Admit: 2021-04-03 | Discharge: 2021-04-03 | Discharge status: Against Medical Advice | Payer: Medicaid Other | Attending: Emergency Medicine | Admitting: Emergency Medicine

## 2021-04-03 ENCOUNTER — Encounter (HOSPITAL_COMMUNITY): Payer: Self-pay

## 2021-04-03 DIAGNOSIS — R0602 Shortness of breath: Secondary | ICD-10-CM | POA: Insufficient documentation

## 2021-04-03 DIAGNOSIS — Z5321 Procedure and treatment not carried out due to patient leaving prior to being seen by health care provider: Secondary | ICD-10-CM | POA: Diagnosis not present

## 2021-04-03 NOTE — ED Notes (Signed)
Pt states all symptoms have resolved and she would like to go home. Dr. Estell Harpin informed and patient signed AMA form.

## 2021-04-03 NOTE — ED Notes (Signed)
Pt breathing regular and non-labored, showing no s/s of acute respiratory distress. Pt talking on phone. Daughter at bedside.

## 2021-04-03 NOTE — ED Triage Notes (Addendum)
Pt brought in by [facility] with c/o seizure/asthma attack. 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 Daughter describes seizure as pt shaking with eyes rolled in the back of her head and states it lasted approx 15-20 minutes. Pt has labored breathing, lung sounds clear and diminished. O2 sat 95% on room air.

## 2021-04-09 NOTE — Progress Notes (Signed)
I called to get her scheduled for a follow up visit and the patient states she is not feeling well and will call us back on tomorrow.  Thanks

## 2021-04-15 LAB — HEMOGLOBIN A1C: Hemoglobin A1C: 7.1

## 2021-04-17 ENCOUNTER — Telehealth: Payer: Self-pay | Admitting: Pulmonary Disease

## 2021-04-17 NOTE — Telephone Encounter (Signed)
Noted.  Will route to PA team.

## 2021-04-17 NOTE — Telephone Encounter (Signed)
Submitted a Prior Authorization request to [facility] MEDICAID for  Trelegy  via [place] [employer]. Will update once we receive a response.   PA# [id number] W

## 2021-04-18 ENCOUNTER — Other Ambulatory Visit: Payer: Self-pay | Admitting: Internal Medicine

## 2021-04-18 DIAGNOSIS — G43809 Other migraine, not intractable, without status migrainosus: Secondary | ICD-10-CM

## 2021-04-18 NOTE — Telephone Encounter (Signed)
Received notification from [facility] MEDICAID regarding a prior authorization for Trelegy. Authorization has been APPROVED from 04/17/21 to 04/17/22.   Called pharmacy and advised of approval. They will process once the medication come in. Called patient to advise.  Authorization # [id number]

## 2021-04-24 ENCOUNTER — Encounter: Payer: Self-pay | Admitting: Neurology

## 2021-04-24 ENCOUNTER — Ambulatory Visit (INDEPENDENT_AMBULATORY_CARE_PROVIDER_SITE_OTHER): Payer: Self-pay | Admitting: Neurology

## 2021-04-24 DIAGNOSIS — Z91199 Patient's noncompliance with other medical treatment and regimen due to unspecified reason: Secondary | ICD-10-CM

## 2021-04-24 NOTE — Progress Notes (Signed)
PATIENT NO SHOWED NEW APPOINTMENTS x 2 in neurology for new chief complaint. Appointments were for new referral, migraines. Dismissal per office policy.    History: Cc 04/24/2021: Migraines HPI 04/24/2021: Patient is here as a request by Dr. Allena Katz this time for migraines.  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  She has a past medical history of pseudoseizures, sleep apnea, hypertension, type 2 diabetes, rheumatoid arthritis, depression, severe obesity, asthma.  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  When last seen she was not driving.  We ordered MRI of the brain and EEG.  EEG in October 2021 after hospitalization for more seizure-like activity was normal.  We recommended Lyrica and/or gabapentin to help with her chronic pain, she was placed on gabapentin from the hospital, which are also antiepileptic drugs even though her episodes were likely nonepileptic she really needed a work-up because people with nonepileptic events may also have true epileptic seizures.  I do not see where she completed the MRI of the brain but I do see a CT of the head in October 2021 which appeared normal.

## 2021-05-02 ENCOUNTER — Encounter: Payer: Medicaid Other | Attending: Physical Medicine & Rehabilitation | Admitting: Physical Medicine & Rehabilitation

## 2021-05-02 ENCOUNTER — Encounter: Payer: Self-pay | Admitting: Physical Medicine & Rehabilitation

## 2021-05-02 ENCOUNTER — Other Ambulatory Visit: Payer: Self-pay

## 2021-05-02 VITALS — BP 130/84 | HR 92 | Temp 97.9°F | Ht 64.0 in | Wt 282.2 lb

## 2021-05-02 DIAGNOSIS — G8929 Other chronic pain: Secondary | ICD-10-CM | POA: Insufficient documentation

## 2021-05-02 MED ORDER — PREGABALIN 75 MG PO CAPS: 75.0000 mg | ORAL_CAPSULE | Freq: Two times a day (BID) | ORAL | 1 refills | Status: DC

## 2021-05-02 NOTE — Progress Notes (Signed)
Subjective:    Patient ID: Elizabeth Bautista, female    DOB: 1975-10-07, 45 y.o.   MRN: [id number]  HPI 45 yo female with seropositive RA who has been referred by PCP for wide spread body pain.  Also has severe low back pain and limits ambulation.  Pain is mainly around the waist down to the upper sacrum. Tried aquatic therapy in [place] (moved ~39yrs ago) RA primarily affecting hand joints, tried voltaren gel for fingers but was not helpful,  The patient also describes widespread achiness in her upper back neck mid back lower back as well as left arm both thighs and both ankles.  Pain is described as sharp stabbing tingling and aching.  Worse with walking sitting standing.  Climb steps on good days sometimes not she does not drive.  Uses a cane to ambulate and sometimes a walker on bad days.  Review of systems positive bladder control problems weakness numbness tingling trouble walking spasms negative for suicidal history of respiratory infections shortness of breath sleep apnea and wheezing.  She is currently not vomiting. She does not do any exercise she states that painful. Hx of morbid obesity pursuing bariatric surgery, date will be around 1 month Pain Inventory Average Pain 10 Pain Right Now 8 My pain is sharp, stabbing, tingling, and aching  In the last 24 hours, has pain interfered with the following? General activity 8 Relation with others 10 Enjoyment of life 8 What TIME of day is your pain at its worst? morning  and night Sleep (in general) Poor  Pain is worse with: walking, sitting, and standing Pain improves with:  nothing Relief from Meds: 2  use a cane use a walker ability to climb steps?  yes do you drive?  no  not employed: date last employed 2019 I need assistance with the following:  dressing, bathing, toileting, meal prep, household duties, and shopping  bladder control problems weakness numbness tingling trouble  walking spasms dizziness confusion depression anxiety  Any changes since last visit?  no  Any changes since last visit?  no Primary care Rutwik Patel    Family History  Problem Relation Age of Onset   High blood pressure Mother    Rheum arthritis Mother    Diabetes Father    Diabetes Sister    Heart Problems Sister    Diabetes Brother    Diabetes Paternal Grandmother    Diabetes Other        father's side "everybody died from Diabetes"   High blood pressure Other        mother's side, multiple siblings with this    Heart attack Other        family member on mother's side    Diabetes Paternal Aunt    Seizures Cousin        not sibings to the other cousins with seizures   Breast cancer Cousin    Seizures Cousin        not sibings to the other cousins with seizures   Seizures Cousin        not sibings to the other cousins with seizures   Cervical cancer Maternal Aunt    Breast cancer Cousin    Dementia Maternal Aunt    Asthma Maternal Aunt    Heart Problems Maternal Grandmother    Diabetes Brother    Asthma Daughter    Angioedema Daughter    Asthma Son    Social History   Socioeconomic History   Marital status: Single  Spouse name: Not on file   Number of children: 5   Years of education: Not on file   Highest education level: 9th grade  Occupational History   Not on file  Tobacco Use   Smoking status: Former    Years: 10.00    Types: Cigarettes    Quit date: 06/02/2013    Years since quitting: 7.9   Smokeless tobacco: Never   Tobacco comments:    during the 10 years of smoking, smoked 2-3 cigarettes/day  Vaping Use   Vaping Use: Never used  Substance and Sexual Activity   Alcohol use: Yes    Comment: occ   Drug use: Not Currently   Sexual activity: Not Currently    Birth control/protection: Abstinence, Surgical    Comment: tubal  Other Topics Concern   Not on file  Social History Narrative   Divorced.Lives with 3 kids.Originally from  [place].Came from [place] ,[place] 7 months ago.      02/27/2020   Right handed   Caffeine: none    Social Determinants of Health   Financial Resource Strain: High Risk   Difficulty of Paying Living Expenses: Very hard  Food Insecurity: Food Insecurity Present   Worried About Programme researcher, broadcasting/film/video in the Last Year: Often true   Barista in the Last Year: Often true  Transportation Needs: No Transportation Needs   Lack of Transportation (Medical): No   Lack of Transportation (Non-Medical): No  Physical Activity: Inactive   Days of Exercise per Week: 0 days   Minutes of Exercise per Session: 0 min  Stress: Stress Concern Present   Feeling of Stress : Very much  Social Connections: Socially Isolated   Frequency of Communication with Friends and Family: Once a week   Frequency of Social Gatherings with Friends and Family: Never   Attends Religious Services: Never   Database administrator or Organizations: No   Attends Engineer, structural: Never   Marital Status: Divorced   Past Surgical History:  Procedure Laterality Date   APPENDECTOMY     CESAREAN SECTION     x3   Past Medical History:  Diagnosis Date   Anemia    Asthma    COVID-19 virus infection 05/16/2020   04/27/20 dx covid-19, not hospitalized   Diabetes (HCC)    on Metformin   Eczema    Edema    Heart problem    "something with the arteries on the left side of the heart"; upcoming appt with cardiology for evaluation   HTN (hypertension)    Rheumatoid arthritis (HCC)    on Plaquenil    Seizures (HCC)    last seizure 2019   Sleep apnea    Sleep apnea    BP 130/84   Pulse 92   Temp 97.9 F (36.6 C)   Ht 5\' 4"  (1.626 m)   Wt 282 lb 3.2 oz (128 kg)   SpO2 97%   BMI 48.44 kg/m   Opioid Risk Score:   Fall Risk Score:  `1  Depression screen PHQ 2/9  Depression screen [facility] 2/9 05/02/2021 02/14/2021 11/01/2020 09/26/2020 09/19/2020 09/11/2020 08/22/2020  Decreased Interest 2 0 0 2 0 2 1  Down,  Depressed, Hopeless 2 3 3 2  0 2 1  PHQ - 2 Score 4 3 3 4  0 4 2  Altered sleeping 2 3 3 2  - 3 1  Tired, decreased energy 2 3 3 2  - 3 3  Change in appetite 1  0 0 2 - 3 0  Feeling bad or failure about yourself  3 0 0 2 - 3 2  Trouble concentrating 3 3 3 2  - 3 2  Moving slowly or fidgety/restless 0 3 3 1  - 3 2  Suicidal thoughts 1 0 0 0 - 0 0  PHQ-9 Score 16 15 15 15  - 22 12  Difficult doing work/chores - Somewhat difficult Somewhat difficult Somewhat difficult Not difficult at all Very difficult Somewhat difficult  Some recent data might be hidden     Review of Systems  Constitutional:  Positive for diaphoresis.  HENT: Negative.    Eyes: Negative.   Respiratory: Negative.    Cardiovascular:  Positive for leg swelling.  Gastrointestinal:  Positive for nausea and vomiting.  Endocrine: Negative.   Genitourinary:        Bladder control  Musculoskeletal:  Positive for back pain and gait problem.       Spasms  Skin:  Positive for rash.  Allergic/Immunologic: Negative.   Neurological:  Positive for dizziness, weakness and numbness.       Tingling  Hematological: Negative.   Psychiatric/Behavioral:  Positive for confusion and dysphoric mood. The patient is nervous/anxious.   All other systems reviewed and are negative.     Objective:   Physical Exam Vitals and nursing note reviewed.  Constitutional:      Appearance: She is obese.  HENT:     Head: Normocephalic and atraumatic.  Eyes:     Extraocular Movements: Extraocular movements intact.     Conjunctiva/sclera: Conjunctivae normal.     Pupils: Pupils are equal, round, and reactive to light.  Cardiovascular:     Rate and Rhythm: Normal rate and regular rhythm.     Heart sounds: Normal heart sounds. No murmur heard. Pulmonary:     Effort: Pulmonary effort is normal.     Breath sounds: Normal breath sounds.  Abdominal:     General: Abdomen is flat. Bowel sounds are normal.     Palpations: Abdomen is soft.  Skin:    General:  Skin is warm and dry.  Neurological:     Mental Status: She is alert and oriented to person, place, and time.     Deep Tendon Reflexes:     Reflex Scores:      Patellar reflexes are 0 on the right side and 0 on the left side.      Achilles reflexes are 1+ on the right side and 0 on the left side.    Comments: Neg SLR  Psychiatric:        Mood and Affect: Mood normal.        Behavior: Behavior normal.   5/5 Bilateral deltoid biceps triceps grip, HF, KE, ADF Sensation reduced to pinprick Left L3-4-5-S1 dermatomes,  Pain to palp lumbar spine L4-L5 paraspinal areas Amb with cane to toe drag or knee instability        Assessment & Plan:    Widespread body pain associated with RA, Probable fibromyalgia as this is a freq comorbid condition with RA and pt has multiple body areas involved Stop gabapentin start pregabalin 75 twice daily patient call in 2 weeks if still not doing well may have to go up to 3 times daily   Morbid obesity agree that bariatric surgery should help resolve some of her pain complaints as well as sleep apnea issue prediabetes and blood pressure.  Should also help with her exercise tolerance.  May reduce some lower extremity joint pain  Would avoid starting opioids prior to surgery given that this may make postoperative pain control more difficult I will see the patient back once he recovers from bariatric surgery assuming that she still has significant pain.  Would anticipate need for some physical therapy perhaps aquatic therapy would avoid medications that depress respiratory drive

## 2021-05-02 NOTE — Patient Instructions (Signed)
Please call in 2wks to let us know how you  are doing on the pregabalin medication  Stop Gabapentin

## 2021-05-03 ENCOUNTER — Telehealth: Payer: Self-pay

## 2021-05-03 DIAGNOSIS — Z8669 Personal history of other diseases of the nervous system and sense organs: Secondary | ICD-10-CM

## 2021-05-03 NOTE — Telephone Encounter (Signed)
Patient called today and asked for another referral the Neuro.  She was dismissed from [facility] because of no show.  She said [facility] did not pick her up and she missed appointments.  Can you refer her to another Neuro asap.  I told her you would not see this note till Monday 10/04/20.

## 2021-05-06 NOTE — Telephone Encounter (Signed)
Referral placed.

## 2021-05-10 LAB — TOXASSURE SELECT,+ANTIDEPR,UR

## 2021-05-12 ENCOUNTER — Encounter: Payer: Self-pay | Admitting: Physical Medicine & Rehabilitation

## 2021-05-14 ENCOUNTER — Telehealth: Payer: Self-pay | Admitting: *Deleted

## 2021-05-14 NOTE — Telephone Encounter (Signed)
Urine drug screen for this encounter is consistent for no controlled medication. 

## 2021-05-16 ENCOUNTER — Encounter: Payer: Self-pay | Admitting: Internal Medicine

## 2021-05-16 ENCOUNTER — Encounter: Payer: Self-pay | Admitting: *Deleted

## 2021-05-16 ENCOUNTER — Other Ambulatory Visit: Payer: Self-pay

## 2021-05-16 ENCOUNTER — Ambulatory Visit (INDEPENDENT_AMBULATORY_CARE_PROVIDER_SITE_OTHER): Payer: Medicaid Other | Admitting: Internal Medicine

## 2021-05-16 VITALS — BP 136/80 | HR 93 | Resp 18 | Ht 64.0 in | Wt 282.1 lb

## 2021-05-16 DIAGNOSIS — I10 Essential (primary) hypertension: Secondary | ICD-10-CM | POA: Diagnosis not present

## 2021-05-16 DIAGNOSIS — M7989 Other specified soft tissue disorders: Secondary | ICD-10-CM | POA: Diagnosis not present

## 2021-05-16 DIAGNOSIS — Z2821 Immunization not carried out because of patient refusal: Secondary | ICD-10-CM

## 2021-05-16 DIAGNOSIS — F419 Anxiety disorder, unspecified: Secondary | ICD-10-CM

## 2021-05-16 DIAGNOSIS — F32A Depression, unspecified: Secondary | ICD-10-CM

## 2021-05-16 DIAGNOSIS — E1165 Type 2 diabetes mellitus with hyperglycemia: Secondary | ICD-10-CM | POA: Diagnosis not present

## 2021-05-16 DIAGNOSIS — M059 Rheumatoid arthritis with rheumatoid factor, unspecified: Secondary | ICD-10-CM | POA: Diagnosis not present

## 2021-05-16 MED ORDER — METFORMIN HCL ER 500 MG PO TB24: 500.0000 mg | ORAL_TABLET | Freq: Every day | ORAL | 1 refills | Status: DC

## 2021-05-16 MED ORDER — HYDROCHLOROTHIAZIDE 25 MG PO TABS: 25.0000 mg | ORAL_TABLET | Freq: Every day | ORAL | 1 refills | Status: DC

## 2021-05-16 MED ORDER — AMLODIPINE BESYLATE 5 MG PO TABS: 5.0000 mg | ORAL_TABLET | Freq: Every day | ORAL | 3 refills | Status: DC

## 2021-05-16 NOTE — Assessment & Plan Note (Signed)
Lab Results  Component Value Date   HGBA1C 7.1 04/15/2021   New onset, was not started on any medication from [facility] Started Metformin 500 mg QD Advised to follow diabetic diet Will discuss about statin after her weight loss surgery F/u CMP and lipid panel Diabetic foot exam: Today Diabetic eye exam: Advised to follow up with Ophthalmology for diabetic eye exam

## 2021-05-16 NOTE — Assessment & Plan Note (Signed)
On Plaquenil Flexeril and Ibuprofen PRN Followed by Rheumatology - Dr Dimple Casey Needs Ophthalmology evaluation due to use of Plaquenil

## 2021-05-16 NOTE — Progress Notes (Signed)
Established Patient Office Visit  Subjective:  Patient ID: Elizabeth Bautista, female    DOB: 05-28-1976  Age: 45 y.o. MRN: [id number]  CC:  Chief Complaint  Patient presents with   Follow-up    3 month follow up     HPI Elizabeth Bautista is a 45 y.o. female with past medical history of HTN, OSA on CPAP, asthma, GERD, RA, depression with anxiety, chronic LE swelling, seizure disorder, chronic low back pain and morbid obesity who presents for f/u of her chronic medical conditions.  Type 2 DM: Her HbA1C was 7.1 in 04/2021. She had stopped taking Metformin as one of her providers told her not to take it due to elevated liver enzymes. She denies any polyuria or polydipsia.  HTN: BP is well-controlled. Takes HCTZ regularly, but has not been taking Amlodipine. Patient denies headache, dizziness, chest pain, dyspnea or palpitations.  She has been seeing Psychiatry for depression with anxiety. She is on Cymbalta and Buspar. She has not brought some medications today.  She follows up with weight loss clinic with [facility] and is planning to get weight loss surgery.  She has been taking Plaquenil for RA, followed by Dr Benjamine Mola.  She refused flu and pneumococcal vaccines today.   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 side of the heart"; upcoming appt with cardiology for evaluation   HTN (hypertension)    Rheumatoid arthritis ([place])    on Plaquenil    Seizures ([place])    last seizure 2019   Sleep apnea    Sleep apnea     Past Surgical History:  Procedure Laterality Date   APPENDECTOMY     CESAREAN SECTION     x3    Family History  Problem Relation Age of Onset   High blood pressure Mother    Rheum arthritis Mother    Diabetes Father    Diabetes Sister    Heart Problems  Sister    Diabetes Brother    Diabetes Paternal Grandmother    Diabetes Other        father's side "everybody died from Diabetes"   High blood pressure Other        mother's side, multiple siblings with this    Heart attack Other        family member on mother's side    Diabetes Paternal Aunt    Seizures Cousin        not sibings to the other cousins with seizures   Breast cancer Cousin    Seizures Cousin        not sibings to the other cousins with seizures   Seizures Cousin        not sibings to the other cousins with seizures   Cervical cancer Maternal Aunt    Breast cancer Cousin    Dementia Maternal Aunt    Asthma Maternal Aunt    Heart Problems Maternal Grandmother    Diabetes Brother    Asthma Daughter    Angioedema Daughter    Asthma Son     Social History   Socioeconomic History   Marital status: Single    Spouse name: Not on file   Number of children: 5  Years of education: Not on file   Highest education level: 9th grade  Occupational History   Not on file  Tobacco Use   Smoking status: Former    Years: 10.00    Types: Cigarettes    Quit date: 06/02/2013    Years since quitting: 7.9   Smokeless tobacco: Never   Tobacco comments:    during the 10 years of smoking, smoked 2-3 cigarettes/day  Vaping Use   Vaping Use: Never used  Substance and Sexual Activity   Alcohol use: Yes    Comment: occ   Drug use: Not Currently   Sexual activity: Not Currently    Birth control/protection: Abstinence, Surgical    Comment: tubal  Other Topics Concern   Not on file  Social History Narrative   Divorced.Lives with 3 kids.Originally from [place].Came from [place] 7 months ago.      02/27/2020   Right handed   Caffeine: none    Social Determinants of Health   Financial Resource Strain: High Risk   Difficulty of Paying Living Expenses: Very hard  Food Insecurity: Food Insecurity Present   Worried About Charity fundraiser in the Last Year: Often true   Arts development officer in the Last Year: Often true  Transportation Needs: No Transportation Needs   Lack of Transportation (Medical): No   Lack of Transportation (Non-Medical): No  Physical Activity: Inactive   Days of Exercise per Week: 0 days   Minutes of Exercise per Session: 0 min  Stress: Stress Concern Present   Feeling of Stress : Very much  Social Connections: Socially Isolated   Frequency of Communication with Friends and Family: Once a week   Frequency of Social Gatherings with Friends and Family: Never   Attends Religious Services: Never   Marine scientist or Organizations: No   Attends Music therapist: Never   Marital Status: Divorced  Human resources officer Violence: Not At Risk   Fear of Current or Ex-Partner: No   Emotionally Abused: No   Physically Abused: No   Sexually Abused: No    Outpatient Medications Prior to Visit  Medication Sig Dispense Refill   albuterol (PROAIR HFA) 108 (90 Base) MCG/ACT inhaler 2 puffs every 4 hours as needed only  if your can't catch your breath 18 g 11   albuterol (PROVENTIL) (2.5 MG/3ML) 0.083% nebulizer solution Take 3 mLs (2.5 mg total) by nebulization every 6 (six) hours as needed for wheezing or shortness of breath. 360 mL 5   azelastine (ASTELIN) 0.1 % nasal spray Place 1 spray into both nostrils 2 (two) times daily. Use in each nostril as directed 30 mL 12   blood glucose meter kit and supplies Dispense based on patient and insurance preference. Use up to four times daily as directed. (FOR ICD-10 E10.9, E11.9). 1 each 0   Blood Pressure Monitoring (SPHYGMOMANOMETER) MISC 1 each by Does not apply route daily. 1 each 0   busPIRone (BUSPAR) 7.5 MG tablet Take 7.5 mg by mouth 2 (two) times daily.     cetirizine (ZYRTEC) 10 MG tablet Take 1 tablet (10 mg total) by mouth daily. 30 tablet 5   Cholecalciferol 1.25 MG (50000 UT) TABS Take 1 tablet by mouth daily. 90 tablet 0   cyclobenzaprine (FLEXERIL) 10 MG tablet Take 10 mg by mouth  3 (three) times daily as needed for muscle spasms.      DULoxetine (CYMBALTA) 60 MG capsule Take 60 mg by mouth daily.  EPINEPHrine 0.3 mg/0.3 mL IJ SOAJ injection Inject 0.3 mg into the muscle as needed for anaphylaxis. 1 each 1   escitalopram (LEXAPRO) 20 MG tablet Take 1 tablet by mouth every evening.     famotidine (PEPCID) 20 MG tablet Take 20 mg by mouth daily.     ferrous sulfate 325 (65 FE) MG tablet TAKE 1 TABLET BY MOUTH TWICE A DAY 180 tablet 1   fluticasone (CUTIVATE) 0.005 % ointment Apply 1 application topically daily.     fluticasone (FLONASE) 50 MCG/ACT nasal spray SPRAY 1 SPRAY INTO BOTH NOSTRILS DAILY. 16 mL 5   Fluticasone-Umeclidin-Vilant (TRELEGY ELLIPTA) 200-62.5-25 MCG/INH AEPB Inhale 1 puff into the lungs daily. 60 each 5   hydroxychloroquine (PLAQUENIL) 200 MG tablet Take 1 tablet (200 mg total) by mouth 2 (two) times daily. 60 tablet 1   pantoprazole (PROTONIX) 40 MG tablet Take 1 tablet (40 mg total) by mouth daily. Take 30-60 min before first meal of the day 30 tablet 2   pregabalin (LYRICA) 75 MG capsule Take 1 capsule (75 mg total) by mouth 2 (two) times daily. 60 capsule 1   promethazine (PHENERGAN) 25 MG tablet Take 1 tablet (25 mg total) by mouth every 6 (six) hours as needed for nausea or vomiting. 30 tablet 1   QUEtiapine (SEROQUEL) 25 MG tablet Take 25 mg by mouth 2 (two) times daily.      SUMAtriptan (IMITREX) 50 MG tablet TAKE 1 TABLET EVERY 2 HOURS AS NEEDED FOR MIGRAINE OR HEADACHE. 10 tablet 0   triamcinolone (KENALOG) 0.025 % ointment APPLY TO AFFECTED AREA TWICE A DAY 454 g 0   triamcinolone ointment (KENALOG) 0.5 % Apply 1 application topically as needed. 30 g 0   amLODipine (NORVASC) 5 MG tablet Take 1 tablet (5 mg total) by mouth daily. 90 tablet 3   hydrochlorothiazide (HYDRODIURIL) 25 MG tablet TAKE 1 TABLET (25 MG TOTAL) BY MOUTH DAILY. FOR HIGH BLOOD PRESSURE. 90 tablet 0   hydrOXYzine (ATARAX/VISTARIL) 25 MG tablet Take 25 mg by mouth 2 (two)  times daily.     mirtazapine (REMERON) 30 MG tablet Take 30 mg by mouth at bedtime.     levETIRAcetam (KEPPRA) 500 MG tablet Take 1 tablet (500 mg total) by mouth 2 (two) times daily. 60 tablet 2   montelukast (SINGULAIR) 10 MG tablet Take 1 tablet (10 mg total) by mouth at bedtime. 30 tablet 1   furosemide (LASIX) 20 MG tablet Take 20 mg by mouth daily. (Patient not taking: Reported on 05/16/2021)     gabapentin (NEURONTIN) 100 MG capsule Take 2 capsules (200 mg total) by mouth 3 (three) times daily as needed (for left leg nerve pain). 30 capsule 0   ibuprofen (ADVIL) 800 MG tablet Take 1 tablet (800 mg total) by mouth every 8 (eight) hours as needed. (Patient not taking: Reported on 05/16/2021) 30 tablet 0   No facility-administered medications prior to visit.    Allergies  Allergen Reactions   Ferumoxytol Anaphylaxis    ~5 years ago. Tolerated IV venofer   Phenytoin Anaphylaxis    Other reaction(s): swelling and heart rate decreased   Pecan Nut (Diagnostic) Other (See Comments)   Phenylbutazones Other (See Comments)    ROS Review of Systems  Constitutional:  Positive for fatigue. Negative for chills and fever.  HENT:  Negative for congestion, sinus pressure, sinus pain and sore throat.   Eyes:  Negative for pain and discharge.  Respiratory:  Positive for shortness of breath and wheezing.  Negative for cough.   Cardiovascular:  Negative for chest pain and palpitations.  Gastrointestinal:  Negative for abdominal pain, diarrhea, nausea and vomiting.  Endocrine: Negative for polydipsia and polyuria.  Genitourinary:  Negative for dysuria and hematuria.  Musculoskeletal:  Positive for arthralgias and back pain. Negative for neck pain and neck stiffness.  Skin:  Negative for rash.  Neurological:  Positive for weakness. Negative for dizziness.  Psychiatric/Behavioral:  Positive for dysphoric mood and sleep disturbance. Negative for agitation and behavioral problems. The patient is  nervous/anxious.      Objective:    Physical Exam Vitals reviewed.  Constitutional:      General: She is not in acute distress.    Appearance: She is obese. She is not diaphoretic.  HENT:     Head: Normocephalic and atraumatic.     Nose: Nose normal. No congestion.     Mouth/Throat:     Mouth: Mucous membranes are moist.     Pharynx: No posterior oropharyngeal erythema.  Eyes:     General: No scleral icterus.    Extraocular Movements: Extraocular movements intact.  Cardiovascular:     Rate and Rhythm: Normal rate and regular rhythm.     Pulses: Normal pulses.     Heart sounds: Normal heart sounds. No murmur heard. Pulmonary:     Breath sounds: Wheezing (B/l diffuse) present. No rales.  Musculoskeletal:     Cervical back: Neck supple. No tenderness.     Right lower leg: No edema.     Left lower leg: No edema.  Skin:    General: Skin is warm.     Findings: No rash.  Neurological:     General: No focal deficit present.     Mental Status: She is alert and oriented to person, place, and time.  Psychiatric:        Mood and Affect: Mood normal.        Behavior: Behavior normal.    BP 136/80 (BP Location: Left Arm, Patient Position: Sitting, Cuff Size: Normal)    Pulse 93    Resp 18    Ht _0  (1.626 m)    Wt 282 lb 1.3 oz (128 kg)    SpO2 99%    BMI 48.42 kg/m  Wt Readings from Last 3 Encounters:  05/16/21 282 lb 1.3 oz (128 kg)  05/02/21 282 lb 3.2 oz (128 kg)  04/03/21 284 lb (128.8 kg)    Lab Results  Component Value Date   TSH 1.050 02/16/2020   Lab Results  Component Value Date   WBC 7.9 12/25/2020   HGB 8.4 (L) 12/25/2020   HCT 31.3 (L) 12/25/2020   MCV 68.2 (L) 12/25/2020   PLT 301 12/25/2020   Lab Results  Component Value Date   NA 139 12/25/2020   K 4.1 12/25/2020   CO2 25 12/25/2020   GLUCOSE 144 (H) 12/25/2020   BUN 8 12/25/2020   CREATININE 0.70 12/25/2020   BILITOT 0.3 12/25/2020   ALKPHOS 62 05/10/2020   AST 16 12/25/2020   ALT 13  12/25/2020   PROT 6.4 12/25/2020   ALBUMIN 3.5 05/10/2020   CALCIUM 8.9 12/25/2020   ANIONGAP 8 10/01/2020   EGFR 108 12/25/2020   Lab Results  Component Value Date   CHOL 212 (H) 01/18/2020   Lab Results  Component Value Date   HDL 82 01/18/2020   Lab Results  Component Value Date   LDLCALC 111 (H) 01/18/2020   Lab Results  Component Value Date  TRIG 88 01/18/2020   Lab Results  Component Value Date   CHOLHDL 2.6 01/18/2020   Lab Results  Component Value Date   HGBA1C 7.1 04/15/2021      Assessment & Plan:   Problem List Items Addressed This Visit       Cardiovascular and Mediastinum   Essential hypertension    BP Readings from Last 1 Encounters:  05/16/21 136/80  Well-controlled with HCTZ Has stopped taking Amlodipine, discontinued Counseled for compliance with the medications Advised DASH diet and moderate exercise/walking, at least 150 mins/week      Relevant Medications   hydrochlorothiazide (HYDRODIURIL) 25 MG tablet     Endocrine   Type 2 diabetes mellitus with hyperglycemia, without long-term current use of insulin ([place]) - Primary    Lab Results  Component Value Date   HGBA1C 7.1 04/15/2021  New onset, was not started on any medication from [facility] Started Metformin 500 mg QD Advised to follow diabetic diet Will discuss about statin after her weight loss surgery F/u CMP and lipid panel Diabetic foot exam: Today Diabetic eye exam: Advised to follow up with Ophthalmology for diabetic eye exam      Relevant Medications   metFORMIN (GLUCOPHAGE XR) 500 MG 24 hr tablet   Other Relevant Orders   Microalbumin, urine     Musculoskeletal and Integument   Seropositive rheumatoid arthritis ([facility])    On Plaquenil Flexeril and Ibuprofen PRN Followed by Rheumatology - Dr Benjamine Mola Needs Ophthalmology evaluation due to use of Plaquenil        Other   Swelling of lower extremity   Relevant Medications   hydrochlorothiazide (HYDRODIURIL) 25 MG  tablet   Anxiety and depression    Followed by Psychiatry Needs to bring all home meds for review      Relevant Medications   DULoxetine (CYMBALTA) 60 MG capsule   Other Visit Diagnoses     Refused influenza vaccine           Meds ordered this encounter  Medications   metFORMIN (GLUCOPHAGE XR) 500 MG 24 hr tablet    Sig: Take 1 tablet (500 mg total) by mouth daily with breakfast.    Dispense:  90 tablet    Refill:  1   DISCONTD: amLODipine (NORVASC) 5 MG tablet    Sig: Take 1 tablet (5 mg total) by mouth daily.    Dispense:  90 tablet    Refill:  3   hydrochlorothiazide (HYDRODIURIL) 25 MG tablet    Sig: Take 1 tablet (25 mg total) by mouth daily. For high blood pressure.    Dispense:  90 tablet    Refill:  1    Follow-up: Return in about 6 months (around 11/14/2021) for DM and HTN.    [name], MD

## 2021-05-16 NOTE — Assessment & Plan Note (Signed)
BP Readings from Last 1 Encounters:  05/16/21 136/80   Well-controlled with HCTZ Has stopped taking Amlodipine, discontinued Counseled for compliance with the medications Advised DASH diet and moderate exercise/walking, at least 150 mins/week

## 2021-05-16 NOTE — Assessment & Plan Note (Signed)
Followed by Psychiatry Needs to bring all home meds for review

## 2021-05-16 NOTE — Patient Instructions (Addendum)
Please start taking Metformin for diabetes.  Please continue taking medications as prescribed.  Please continue to follow low carb diet and perform moderate exercise/walking as tolerated.

## 2021-05-30 ENCOUNTER — Ambulatory Visit (INDEPENDENT_AMBULATORY_CARE_PROVIDER_SITE_OTHER): Payer: Medicaid Other | Admitting: Internal Medicine

## 2021-05-30 ENCOUNTER — Encounter: Payer: Self-pay | Admitting: Internal Medicine

## 2021-05-30 ENCOUNTER — Other Ambulatory Visit: Payer: Self-pay

## 2021-05-30 ENCOUNTER — Encounter: Payer: Self-pay | Admitting: Neurology

## 2021-05-30 VITALS — BP 118/82 | HR 91 | Resp 18 | Ht 64.0 in | Wt 281.0 lb

## 2021-05-30 DIAGNOSIS — L989 Disorder of the skin and subcutaneous tissue, unspecified: Secondary | ICD-10-CM

## 2021-05-30 DIAGNOSIS — K047 Periapical abscess without sinus: Secondary | ICD-10-CM

## 2021-05-30 DIAGNOSIS — G43709 Chronic migraine without aura, not intractable, without status migrainosus: Secondary | ICD-10-CM

## 2021-05-30 MED ORDER — PERMETHRIN 5 % EX CREA: 1.0000 "application " | TOPICAL_CREAM | Freq: Once | CUTANEOUS | 0 refills | Status: AC

## 2021-05-30 MED ORDER — AMOXICILLIN-POT CLAVULANATE 875-125 MG PO TABS
1.0000 | ORAL_TABLET | Freq: Two times a day (BID) | ORAL | 0 refills | Status: DC
Start: 2021-05-30 — End: 2021-07-25

## 2021-05-30 NOTE — Patient Instructions (Signed)
Please take Augmentin as prescribed.  Please use Permethrin cream as prescribed.

## 2021-05-30 NOTE — Progress Notes (Signed)
Acute Office Visit  Subjective:    Patient ID: Elizabeth Bautista, female    DOB: 1976/05/13, 45 y.o.   MRN: [id number]  Chief Complaint  Patient presents with   Jaw Pain    Pt has been having jaw pain radiating up to both ear for 3 weeks started around 05-02-21 also pt has sores on both feet for few months but they are getting worse    HPI Patient is in today for complaint of bilateral knee radiating to her ears for about 3 weeks.  She has intermittent jaw pain, which is worse upon touching and swallowing.  She denies any ear discharge, tinnitus or balance problem currently.  She has not seen dentist for the last 2 years.  Denies any fever, chills currently.  She reports having sores on her legs near ankles, which have been itching.  She denies any recent insect bite.  She also reports noticing blackish discoloration/bruising over her legs.  She asked for a new referral to neurology for migraine.  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 side of the heart"; upcoming appt with cardiology for evaluation   HTN (hypertension)    Rheumatoid arthritis ([place])    on Plaquenil    Seizures ([place])    last seizure 2019   Sleep apnea    Sleep apnea     Past Surgical History:  Procedure Laterality Date   APPENDECTOMY     CESAREAN SECTION     x3    Family History  Problem Relation Age of Onset   High blood pressure Mother    Rheum arthritis Mother    Diabetes Father    Diabetes Sister    Heart Problems Sister    Diabetes Brother    Diabetes Paternal Grandmother    Diabetes Other        father's side "everybody died from Diabetes"   High blood pressure Other        mother's side, multiple siblings with this    Heart attack Other        family member on mother's side     Diabetes Paternal Aunt    Seizures Cousin        not sibings to the other cousins with seizures   Breast cancer Cousin    Seizures Cousin        not sibings to the other cousins with seizures   Seizures Cousin        not sibings to the other cousins with seizures   Cervical cancer Maternal Aunt    Breast cancer Cousin    Dementia Maternal Aunt    Asthma Maternal Aunt    Heart Problems Maternal Grandmother    Diabetes Brother    Asthma Daughter    Angioedema Daughter    Asthma Son     Social History   Socioeconomic History   Marital status: Single    Spouse name: Not on file   Number of children: 5   Years of education: Not on file   Highest education level: 9th grade  Occupational History   Not on file  Tobacco Use   Smoking status: Former    Years: 10.00    Types:  Cigarettes    Quit date: 06/02/2013    Years since quitting: 7.9   Smokeless tobacco: Never   Tobacco comments:    during the 10 years of smoking, smoked 2-3 cigarettes/day  Vaping Use   Vaping Use: Never used  Substance and Sexual Activity   Alcohol use: Yes    Comment: occ   Drug use: Not Currently   Sexual activity: Not Currently    Birth control/protection: Abstinence, Surgical    Comment: tubal  Other Topics Concern   Not on file  Social History Narrative   Divorced.Lives with 3 kids.Originally from [place].Came from [place] 7 months ago.      02/27/2020   Right handed   Caffeine: none    Social Determinants of Health   Financial Resource Strain: High Risk   Difficulty of Paying Living Expenses: Very hard  Food Insecurity: Food Insecurity Present   Worried About Charity fundraiser in the Last Year: Often true   Arboriculturist in the Last Year: Often true  Transportation Needs: No Transportation Needs   Lack of Transportation (Medical): No   Lack of Transportation (Non-Medical): No  Physical Activity: Inactive   Days of Exercise per Week: 0 days   Minutes of Exercise per Session: 0  min  Stress: Stress Concern Present   Feeling of Stress : Very much  Social Connections: Socially Isolated   Frequency of Communication with Friends and Family: Once a week   Frequency of Social Gatherings with Friends and Family: Never   Attends Religious Services: Never   Marine scientist or Organizations: No   Attends Music therapist: Never   Marital Status: Divorced  Human resources officer Violence: Not At Risk   Fear of Current or Ex-Partner: No   Emotionally Abused: No   Physically Abused: No   Sexually Abused: No    Outpatient Medications Prior to Visit  Medication Sig Dispense Refill   albuterol (PROAIR HFA) 108 (90 Base) MCG/ACT inhaler 2 puffs every 4 hours as needed only  if your can't catch your breath 18 g 11   albuterol (PROVENTIL) (2.5 MG/3ML) 0.083% nebulizer solution Take 3 mLs (2.5 mg total) by nebulization every 6 (six) hours as needed for wheezing or shortness of breath. 360 mL 5   azelastine (ASTELIN) 0.1 % nasal spray Place 1 spray into both nostrils 2 (two) times daily. Use in each nostril as directed 30 mL 12   blood glucose meter kit and supplies Dispense based on patient and insurance preference. Use up to four times daily as directed. (FOR ICD-10 E10.9, E11.9). 1 each 0   Blood Pressure Monitoring (SPHYGMOMANOMETER) MISC 1 each by Does not apply route daily. 1 each 0   busPIRone (BUSPAR) 7.5 MG tablet Take 7.5 mg by mouth 2 (two) times daily.     cetirizine (ZYRTEC) 10 MG tablet Take 1 tablet (10 mg total) by mouth daily. 30 tablet 5   Cholecalciferol 1.25 MG (50000 UT) TABS Take 1 tablet by mouth daily. 90 tablet 0   cyclobenzaprine (FLEXERIL) 10 MG tablet Take 10 mg by mouth 3 (three) times daily as needed for muscle spasms.      DULoxetine (CYMBALTA) 60 MG capsule Take 60 mg by mouth daily.     EPINEPHrine 0.3 mg/0.3 mL IJ SOAJ injection Inject 0.3 mg into the muscle as needed for anaphylaxis. 1 each 1   escitalopram (LEXAPRO) 20 MG tablet  Take 1 tablet by mouth every evening.  famotidine (PEPCID) 20 MG tablet Take 20 mg by mouth daily.     ferrous sulfate 325 (65 FE) MG tablet TAKE 1 TABLET BY MOUTH TWICE A DAY 180 tablet 1   fluticasone (CUTIVATE) 0.005 % ointment Apply 1 application topically daily.     fluticasone (FLONASE) 50 MCG/ACT nasal spray SPRAY 1 SPRAY INTO BOTH NOSTRILS DAILY. 16 mL 5   Fluticasone-Umeclidin-Vilant (TRELEGY ELLIPTA) 200-62.5-25 MCG/INH AEPB Inhale 1 puff into the lungs daily. 60 each 5   hydrochlorothiazide (HYDRODIURIL) 25 MG tablet Take 1 tablet (25 mg total) by mouth daily. For high blood pressure. 90 tablet 1   hydroxychloroquine (PLAQUENIL) 200 MG tablet Take 1 tablet (200 mg total) by mouth 2 (two) times daily. 60 tablet 1   metFORMIN (GLUCOPHAGE XR) 500 MG 24 hr tablet Take 1 tablet (500 mg total) by mouth daily with breakfast. 90 tablet 1   pantoprazole (PROTONIX) 40 MG tablet Take 1 tablet (40 mg total) by mouth daily. Take 30-60 min before first meal of the day 30 tablet 2   pregabalin (LYRICA) 75 MG capsule Take 1 capsule (75 mg total) by mouth 2 (two) times daily. 60 capsule 1   promethazine (PHENERGAN) 25 MG tablet Take 1 tablet (25 mg total) by mouth every 6 (six) hours as needed for nausea or vomiting. 30 tablet 1   QUEtiapine (SEROQUEL) 25 MG tablet Take 25 mg by mouth 2 (two) times daily.      SUMAtriptan (IMITREX) 50 MG tablet TAKE 1 TABLET EVERY 2 HOURS AS NEEDED FOR MIGRAINE OR HEADACHE. 10 tablet 0   triamcinolone (KENALOG) 0.025 % ointment APPLY TO AFFECTED AREA TWICE A DAY 454 g 0   triamcinolone ointment (KENALOG) 0.5 % Apply 1 application topically as needed. 30 g 0   levETIRAcetam (KEPPRA) 500 MG tablet Take 1 tablet (500 mg total) by mouth 2 (two) times daily. 60 tablet 2   montelukast (SINGULAIR) 10 MG tablet Take 1 tablet (10 mg total) by mouth at bedtime. 30 tablet 1   No facility-administered medications prior to visit.    Allergies  Allergen Reactions    Ferumoxytol Anaphylaxis    ~5 years ago. Tolerated IV venofer   Phenytoin Anaphylaxis    Other reaction(s): swelling and heart rate decreased   Pecan Nut (Diagnostic) Other (See Comments)   Phenylbutazones Other (See Comments)    Review of Systems  Constitutional:  Positive for fatigue. Negative for chills and fever.  HENT:  Negative for congestion, sinus pressure, sinus pain and sore throat.   Eyes:  Negative for pain and discharge.  Respiratory:  Positive for shortness of breath and wheezing. Negative for cough.   Cardiovascular:  Negative for chest pain and palpitations.  Gastrointestinal:  Negative for abdominal pain, diarrhea, nausea and vomiting.  Endocrine: Negative for polydipsia and polyuria.  Genitourinary:  Negative for dysuria and hematuria.  Musculoskeletal:  Positive for arthralgias and back pain. Negative for neck pain and neck stiffness.  Skin:  Positive for rash.  Neurological:  Positive for weakness. Negative for dizziness.  Psychiatric/Behavioral:  Positive for dysphoric mood and sleep disturbance. Negative for agitation and behavioral problems. The patient is nervous/anxious.       Objective:    Physical Exam Vitals reviewed.  Constitutional:      General: She is not in acute distress.    Appearance: She is obese. She is not diaphoretic.  HENT:     Head: Normocephalic and atraumatic.     Jaw: Tenderness present.  Nose: Nose normal. No congestion.     Mouth/Throat:     Mouth: Mucous membranes are moist.     Pharynx: No posterior oropharyngeal erythema.  Eyes:     General: No scleral icterus.    Extraocular Movements: Extraocular movements intact.  Cardiovascular:     Rate and Rhythm: Normal rate and regular rhythm.     Pulses: Normal pulses.     Heart sounds: Normal heart sounds. No murmur heard. Pulmonary:     Breath sounds: No wheezing or rales.  Musculoskeletal:     Cervical back: Neck supple. No tenderness.     Right lower leg: No edema.      Left lower leg: No edema.  Skin:    General: Skin is warm.     Findings: Bruising (Blackinsh pigmentation over legs) and rash (Maculopapular rash over b/l legs) present.  Neurological:     General: No focal deficit present.     Mental Status: She is alert and oriented to person, place, and time.  Psychiatric:        Mood and Affect: Mood normal.        Behavior: Behavior normal.    BP 118/82 (BP Location: Right Arm, Patient Position: Sitting, Cuff Size: Normal)    Pulse 91    Resp 18    Ht 5' 4"  (1.626 m)    Wt 281 lb (127.5 kg)    SpO2 97%    BMI 48.23 kg/m  Wt Readings from Last 3 Encounters:  05/30/21 281 lb (127.5 kg)  05/16/21 282 lb 1.3 oz (128 kg)  05/02/21 282 lb 3.2 oz (128 kg)        Assessment & Plan:   Problem List Items Addressed This Visit    Visit Diagnoses     Dental abscess    -  Primary Jaw tenderness and radiating pain to ears likely due to dental infection Started Augmentin Advised to contact Dentist   Relevant Medications   amoxicillin-clavulanate (AUGMENTIN) 875-125 MG tablet   Sore on leg     Could be due to insect bite - permethrin cream prescribed Referred to Dermatology Leg bruising/blackish discoloration could be due to medication induced (Plaquenil), benign   Relevant Medications   permethrin (ELIMITE) 5 % cream   Other Relevant Orders   Ambulatory referral to Dermatology   Chronic migraine without aura without status migrainosus, not intractable       Referred to Neurology   Relevant Orders   Ambulatory referral to Neurology        Meds ordered this encounter  Medications   amoxicillin-clavulanate (AUGMENTIN) 875-125 MG tablet    Sig: Take 1 tablet by mouth 2 (two) times daily.    Dispense:  14 tablet    Refill:  0   permethrin (ELIMITE) 5 % cream    Sig: Apply 1 application topically once for 1 dose. Apply it on legs at nighttime and remove it in the morning.    Dispense:  60 g    Refill:  0     Renny Gunnarson Keith Rake, MD

## 2021-06-03 ENCOUNTER — Encounter: Payer: Self-pay | Admitting: *Deleted

## 2021-06-06 ENCOUNTER — Telehealth: Payer: Self-pay

## 2021-06-06 MED ORDER — PREGABALIN 75 MG PO CAPS: 75.0000 mg | ORAL_CAPSULE | Freq: Three times a day (TID) | ORAL | 1 refills | Status: DC

## 2021-06-06 NOTE — Addendum Note (Signed)
Addended by: Casilda Carls on: 06/06/2021 04:40 PM   Modules accepted: Orders

## 2021-06-06 NOTE — Telephone Encounter (Signed)
Pregabalin 75 mg TID phone into [facility]

## 2021-06-06 NOTE — Telephone Encounter (Signed)
Patient left voicemail stating the medication is not working since starting it. She states she is in a lot of pain. Please advise

## 2021-06-18 ENCOUNTER — Other Ambulatory Visit: Payer: Self-pay | Admitting: *Deleted

## 2021-06-18 ENCOUNTER — Telehealth: Payer: Self-pay | Admitting: Internal Medicine

## 2021-06-18 DIAGNOSIS — L309 Dermatitis, unspecified: Secondary | ICD-10-CM

## 2021-06-18 DIAGNOSIS — L989 Disorder of the skin and subcutaneous tissue, unspecified: Secondary | ICD-10-CM

## 2021-06-18 NOTE — Telephone Encounter (Signed)
Not sure who she spoke with last call about bariatric was referral which was 07-04-20 looks like the referral was sent 08-22-20 but has not even had surgery clearance appt for weight loss and we have not received anything that has been scanned in chart pt can ask for them to resend this to [place] and then we will set up appt for clearance if needed please let her know

## 2021-06-18 NOTE — Telephone Encounter (Signed)
Pt called in regard to surgery clearance letter   Pt states that [facility] still has not received clearance   Pt states that provider stated it was faxed a month or so ago   Pt would like call back to discuss

## 2021-06-19 NOTE — Telephone Encounter (Signed)
noted 

## 2021-06-19 NOTE — Telephone Encounter (Addendum)
Spoke with pt and informed her per last tele  Pt states that she does not need appt and provider from bariatrics faxed pw   Can call pt and discuss

## 2021-06-19 NOTE — Telephone Encounter (Signed)
LVM for pt to call office in regards

## 2021-07-04 ENCOUNTER — Telehealth: Payer: Self-pay

## 2021-07-04 ENCOUNTER — Telehealth: Payer: Self-pay | Admitting: *Deleted

## 2021-07-04 NOTE — Telephone Encounter (Signed)
Elizabeth Bautista called and reports her medication is not working and is in "so much pain". Please advise.  She doe not have an ppt scheduled at this time.

## 2021-07-04 NOTE — Telephone Encounter (Signed)
error 

## 2021-07-04 NOTE — Telephone Encounter (Signed)
Patient advised Dr. Dimple Casey is on in the office. Patient advised if she is in that much pain she will need an evaluation at urgent care or the emergency room. Patient states she has a video visit set up with her PCP this afternoon. Patient states she is on PLQ and taking it as prescribed. Patient states she is due to schedule surgery which is why she has been unable to start Enbrel until after the surgery. Patient has been scheduled an appointment in our office to discuss on 07/10/2021.

## 2021-07-04 NOTE — Telephone Encounter (Signed)
Patient called stating she is in "excruciating" pain.  Patient states Dr. Benjamine Mola didn't want to start her on any medication until she has her surgery.  Patient is scheduled for a consultation on 07/11/21, but is in a lot of pain and not sure she can wait any longer.  Patient requested a return call to let her know if there is something that can be prescribed.

## 2021-07-10 ENCOUNTER — Ambulatory Visit: Payer: Medicaid Other | Admitting: Internal Medicine

## 2021-07-11 ENCOUNTER — Telehealth: Payer: Self-pay | Admitting: Physical Medicine & Rehabilitation

## 2021-07-11 ENCOUNTER — Other Ambulatory Visit: Payer: Self-pay | Admitting: Internal Medicine

## 2021-07-11 ENCOUNTER — Other Ambulatory Visit: Payer: Self-pay

## 2021-07-11 ENCOUNTER — Encounter: Payer: Self-pay | Admitting: Physical Medicine & Rehabilitation

## 2021-07-11 ENCOUNTER — Encounter: Payer: Medicaid Other | Attending: Physical Medicine & Rehabilitation | Admitting: Physical Medicine & Rehabilitation

## 2021-07-11 VITALS — BP 125/80 | HR 85 | Ht 64.25 in | Wt 274.0 lb

## 2021-07-11 DIAGNOSIS — M069 Rheumatoid arthritis, unspecified: Secondary | ICD-10-CM

## 2021-07-11 DIAGNOSIS — G8929 Other chronic pain: Secondary | ICD-10-CM | POA: Diagnosis not present

## 2021-07-11 MED ORDER — PREGABALIN 150 MG PO CAPS: 150.0000 mg | ORAL_CAPSULE | Freq: Two times a day (BID) | ORAL | 2 refills | Status: DC

## 2021-07-11 NOTE — Telephone Encounter (Signed)
Next Visit: Nothing scheduled Last Visit: 12/25/2020   Last Fill: 01/02/2020   DX: Seropositive rheumatoid arthritis   Current Dose per office note 12/25/2020: hydroxychloroquine 400 mg p.o. daily   Labs: 12/25/2020- CBC, CMP Eye Exam:not on file.    Okay to refill PLQ? When do you want patient to follow up?

## 2021-07-11 NOTE — Progress Notes (Signed)
Subjective:    Patient ID: Elizabeth Bautista, female    DOB: [birth year], 45 y.o.   MRN: [id number] Telehealth visit, video Physician in the office Patient at home HPI 46 year old female with seropositive RA that has primary complaint of widespread body pain.  She has been diagnosed with fibromyalgia syndrome as well.  She complains today of right shoulder swelling and pain as well as right greater than left forearm pain. The patient has not had no falls or trauma.  No other new medical issues.  She is now on pregabalin 75 mg 3 times per day She will be undergoing bariatric surgery soon she thinks either at the end of February or the beginning of March.,  She has lost 25 pounds in the last 6 months with dieting. Pain Inventory Average Pain 10 Pain Right Now 10 My pain is constant, sharp, stabbing, tingling, and at its worst it is excruciating  In the last 24 hours, has pain interfered with the following? General activity 10 Relation with others 9 Enjoyment of life 10 What TIME of day is your pain at its worst? daytime and night Sleep (in general) Poor  Pain is worse with: walking, bending, sitting, inactivity, standing, and some activites Pain improves with: medication Relief from Meds: 8  Family History  Problem Relation Age of Onset   High blood pressure Mother    Rheum arthritis Mother    Diabetes Father    Diabetes Sister    Heart Problems Sister    Diabetes Brother    Diabetes Paternal Grandmother    Diabetes Other        father's side "everybody died from Diabetes"   High blood pressure Other        mother's side, multiple siblings with this    Heart attack Other        family member on mother's side    Diabetes Paternal Aunt    Seizures Cousin        not sibings to the other cousins with seizures   Breast cancer Cousin    Seizures Cousin        not sibings to the other cousins with seizures   Seizures Cousin        not sibings to the other cousins with seizures    Cervical cancer Maternal Aunt    Breast cancer Cousin    Dementia Maternal Aunt    Asthma Maternal Aunt    Heart Problems Maternal Grandmother    Diabetes Brother    Asthma Daughter    Angioedema Daughter    Asthma Son    Social History   Socioeconomic History   Marital status: Single    Spouse name: Not on file   Number of children: 5   Years of education: Not on file   Highest education level: 9th grade  Occupational History   Not on file  Tobacco Use   Smoking status: Former    Years: 10.00    Types: Cigarettes    Quit date: 06/02/2013    Years since quitting: 8.1   Smokeless tobacco: Never   Tobacco comments:    during the 10 years of smoking, smoked 2-3 cigarettes/day  Vaping Use   Vaping Use: Never used  Substance and Sexual Activity   Alcohol use: Yes    Comment: occ   Drug use: Not Currently   Sexual activity: Not Currently    Birth control/protection: Abstinence, Surgical    Comment: tubal  Other Topics Concern  Not on file  Social History Narrative   Divorced.Lives with 3 kids.Originally from [place].      02/27/2020   Right handed   Caffeine: none    Social Determinants of Health   Financial Resource Strain: Not on file  Food Insecurity: Not on file  Transportation Needs: Not on file  Physical Activity: Not on file  Stress: Not on file  Social Connections: Not on file   Past Surgical History:  Procedure Laterality Date   APPENDECTOMY     CESAREAN SECTION     x3   Past Surgical History:  Procedure Laterality Date   APPENDECTOMY     CESAREAN SECTION     x3   Past Medical History:  Diagnosis Date   Anemia    Asthma    COVID-19 virus infection 05/16/2020   04/27/20 dx covid-19, not hospitalized   Diabetes ([facility])    on Metformin   Eczema    Edema    Heart problem    "something with the arteries on the left side of the heart"; upcoming appt with cardiology for evaluation   HTN (hypertension)     Rheumatoid arthritis ([facility])    on Plaquenil    Seizures ([facility])    last seizure 2019   Sleep apnea    Sleep apnea    There were no vitals taken for this visit.  Opioid Risk Score:   Fall Risk Score:  `1  Depression screen PHQ 2/9  Depression screen [facility] 2/9 05/30/2021 05/16/2021 05/02/2021 02/14/2021 11/01/2020 09/26/2020 09/19/2020  Decreased Interest 0 3 2 0 0 2 0  Down, Depressed, Hopeless 3 3 2 3 3 2  0  PHQ - 2 Score 3 6 4 3 3 4  0  Altered sleeping 3 3 2 3 3 2  -  Tired, decreased energy 3 3 2 3 3 2  -  Change in appetite 1 1 1  0 0 2 -  Feeling bad or failure about yourself  3 3 3  0 0 2 -  Trouble concentrating 3 3 3 3 3 2  -  Moving slowly or fidgety/restless 3 3 0 3 3 1  -  Suicidal thoughts 0 0 1 0 0 0 -  PHQ-9 Score 19 22 16 15 15 15  -  Difficult doing work/chores Very difficult Somewhat difficult - Somewhat difficult Somewhat difficult Somewhat difficult Not difficult at all  Some recent data might be hidden     Review of Systems  Constitutional: Negative.   HENT: Negative.    Eyes: Negative.   Respiratory: Negative.    Cardiovascular: Negative.   Gastrointestinal: Negative.   Endocrine: Negative.   Genitourinary: Negative.   Musculoskeletal:  Positive for joint swelling and myalgias.       Right shoulder and right hand  Skin: Negative.   Allergic/Immunologic: Negative.   Neurological:  Positive for weakness and numbness.       Tingling  Hematological: Negative.   Psychiatric/Behavioral:  Positive for dysphoric mood.   All other systems reviewed and are negative.     Objective:   Physical Exam Patient is alert and oriented No evidence of dysarthria No evidence of facial droop Observation of the shoulders right arm dominant certainly more muscular development on the right side but no clear-cut signs of joint swelling. She is able to raise her arms over her head Remainder of exam limited by video format  No evidence of swelling in the fingers or wrist joints  Assessment & Plan:   #1.  Chronic widespread body pain most consistent with fibromyalgia syndrome, she has underlying seropositive RA but does not appear to have any active synovitis at least in the hands and wrist area, difficult to say about the shoulder at this point due to lack of physical examination. We discussed making an in person visit for next visit. Will increase pregabalin to 150 mg twice daily Physical medicine rehab follow-up in 3 months

## 2021-07-11 NOTE — Telephone Encounter (Signed)
Patient wanted to let Dr. Wynn BankerKirsteins know that she is having her surgery on March 2.  I have scheduled her to follow up with you in mid April.

## 2021-07-11 NOTE — Telephone Encounter (Signed)
Okay to refill, we were planning to follow up once she can start the Enbrel which has been delayed a lot anticipating bariatric surgery. Does she know when this will be yet?

## 2021-07-12 NOTE — Telephone Encounter (Signed)
Patient states she will be having her surgery on 08/01/2021. Patient has also had her PLQ eye exam and will call the eye doctor to have them send the results.

## 2021-07-17 ENCOUNTER — Inpatient Hospital Stay (HOSPITAL_COMMUNITY)
Admission: EM | Admit: 2021-07-17 | Discharge: 2021-07-25 | DRG: 208 | Disposition: A | Discharge status: Home / Self Care | Payer: Medicaid Other | Attending: Family Medicine | Admitting: Family Medicine

## 2021-07-17 ENCOUNTER — Emergency Department (HOSPITAL_COMMUNITY): Payer: Medicaid Other

## 2021-07-17 ENCOUNTER — Encounter (HOSPITAL_COMMUNITY): Payer: Self-pay

## 2021-07-17 ENCOUNTER — Other Ambulatory Visit: Payer: Self-pay

## 2021-07-17 DIAGNOSIS — E871 Hypo-osmolality and hyponatremia: Secondary | ICD-10-CM | POA: Diagnosis present

## 2021-07-17 DIAGNOSIS — Z91010 Allergy to peanuts: Secondary | ICD-10-CM

## 2021-07-17 DIAGNOSIS — Z7951 Long term (current) use of inhaled steroids: Secondary | ICD-10-CM | POA: Diagnosis not present

## 2021-07-17 DIAGNOSIS — J45901 Unspecified asthma with (acute) exacerbation: Secondary | ICD-10-CM | POA: Diagnosis present

## 2021-07-17 DIAGNOSIS — Z978 Presence of other specified devices: Secondary | ICD-10-CM

## 2021-07-17 DIAGNOSIS — E559 Vitamin D deficiency, unspecified: Secondary | ICD-10-CM | POA: Diagnosis present

## 2021-07-17 DIAGNOSIS — Z87898 Personal history of other specified conditions: Secondary | ICD-10-CM | POA: Diagnosis not present

## 2021-07-17 DIAGNOSIS — R739 Hyperglycemia, unspecified: Secondary | ICD-10-CM | POA: Diagnosis not present

## 2021-07-17 DIAGNOSIS — I110 Hypertensive heart disease with heart failure: Secondary | ICD-10-CM | POA: Diagnosis present

## 2021-07-17 DIAGNOSIS — J9602 Acute respiratory failure with hypercapnia: Secondary | ICD-10-CM | POA: Diagnosis present

## 2021-07-17 DIAGNOSIS — I5030 Unspecified diastolic (congestive) heart failure: Secondary | ICD-10-CM | POA: Diagnosis present

## 2021-07-17 DIAGNOSIS — Z79899 Other long term (current) drug therapy: Secondary | ICD-10-CM

## 2021-07-17 DIAGNOSIS — I5032 Chronic diastolic (congestive) heart failure: Secondary | ICD-10-CM | POA: Diagnosis present

## 2021-07-17 DIAGNOSIS — Z8249 Family history of ischemic heart disease and other diseases of the circulatory system: Secondary | ICD-10-CM

## 2021-07-17 DIAGNOSIS — Z789 Other specified health status: Secondary | ICD-10-CM

## 2021-07-17 DIAGNOSIS — R569 Unspecified convulsions: Secondary | ICD-10-CM | POA: Diagnosis present

## 2021-07-17 DIAGNOSIS — E1165 Type 2 diabetes mellitus with hyperglycemia: Secondary | ICD-10-CM | POA: Diagnosis present

## 2021-07-17 DIAGNOSIS — F419 Anxiety disorder, unspecified: Secondary | ICD-10-CM | POA: Diagnosis present

## 2021-07-17 DIAGNOSIS — Z7984 Long term (current) use of oral hypoglycemic drugs: Secondary | ICD-10-CM

## 2021-07-17 DIAGNOSIS — Z825 Family history of asthma and other chronic lower respiratory diseases: Secondary | ICD-10-CM

## 2021-07-17 DIAGNOSIS — J9601 Acute respiratory failure with hypoxia: Secondary | ICD-10-CM

## 2021-07-17 DIAGNOSIS — Z4659 Encounter for fitting and adjustment of other gastrointestinal appliance and device: Secondary | ICD-10-CM

## 2021-07-17 DIAGNOSIS — R0609 Other forms of dyspnea: Secondary | ICD-10-CM | POA: Diagnosis not present

## 2021-07-17 DIAGNOSIS — Z87892 Personal history of anaphylaxis: Secondary | ICD-10-CM | POA: Diagnosis not present

## 2021-07-17 DIAGNOSIS — Z794 Long term (current) use of insulin: Secondary | ICD-10-CM | POA: Diagnosis not present

## 2021-07-17 DIAGNOSIS — F458 Other somatoform disorders: Secondary | ICD-10-CM | POA: Diagnosis present

## 2021-07-17 DIAGNOSIS — J4552 Severe persistent asthma with status asthmaticus: Principal | ICD-10-CM | POA: Diagnosis present

## 2021-07-17 DIAGNOSIS — M059 Rheumatoid arthritis with rheumatoid factor, unspecified: Secondary | ICD-10-CM | POA: Diagnosis present

## 2021-07-17 DIAGNOSIS — T380X5A Adverse effect of glucocorticoids and synthetic analogues, initial encounter: Secondary | ICD-10-CM | POA: Diagnosis present

## 2021-07-17 DIAGNOSIS — Z6841 Body Mass Index (BMI) 40.0 and over, adult: Secondary | ICD-10-CM

## 2021-07-17 DIAGNOSIS — G4733 Obstructive sleep apnea (adult) (pediatric): Secondary | ICD-10-CM

## 2021-07-17 DIAGNOSIS — T171XXA Foreign body in nostril, initial encounter: Secondary | ICD-10-CM

## 2021-07-17 DIAGNOSIS — Z20822 Contact with and (suspected) exposure to covid-19: Secondary | ICD-10-CM | POA: Diagnosis present

## 2021-07-17 DIAGNOSIS — R058 Other specified cough: Secondary | ICD-10-CM | POA: Diagnosis not present

## 2021-07-17 DIAGNOSIS — Z8616 Personal history of COVID-19: Secondary | ICD-10-CM

## 2021-07-17 DIAGNOSIS — K219 Gastro-esophageal reflux disease without esophagitis: Secondary | ICD-10-CM | POA: Diagnosis present

## 2021-07-17 DIAGNOSIS — I10 Essential (primary) hypertension: Secondary | ICD-10-CM | POA: Diagnosis not present

## 2021-07-17 DIAGNOSIS — R079 Chest pain, unspecified: Secondary | ICD-10-CM

## 2021-07-17 DIAGNOSIS — Z8261 Family history of arthritis: Secondary | ICD-10-CM

## 2021-07-17 DIAGNOSIS — Z87891 Personal history of nicotine dependence: Secondary | ICD-10-CM

## 2021-07-17 DIAGNOSIS — Z9989 Dependence on other enabling machines and devices: Secondary | ICD-10-CM | POA: Diagnosis not present

## 2021-07-17 DIAGNOSIS — Z888 Allergy status to other drugs, medicaments and biological substances status: Secondary | ICD-10-CM

## 2021-07-17 DIAGNOSIS — E66813 Obesity, class 3: Secondary | ICD-10-CM

## 2021-07-17 DIAGNOSIS — J9690 Respiratory failure, unspecified, unspecified whether with hypoxia or hypercapnia: Secondary | ICD-10-CM

## 2021-07-17 DIAGNOSIS — J4551 Severe persistent asthma with (acute) exacerbation: Secondary | ICD-10-CM

## 2021-07-17 DIAGNOSIS — Z9911 Dependence on respirator [ventilator] status: Secondary | ICD-10-CM | POA: Diagnosis not present

## 2021-07-17 DIAGNOSIS — Z833 Family history of diabetes mellitus: Secondary | ICD-10-CM

## 2021-07-17 LAB — CBC WITH DIFFERENTIAL/PLATELET
Abs Immature Granulocytes: 0.03 10*3/uL (ref 0.00–0.07)
Basophils Absolute: 0 10*3/uL (ref 0.0–0.1)
Basophils Relative: 0 %
Eosinophils Absolute: 0.6 10*3/uL — ABNORMAL HIGH (ref 0.0–0.5)
Eosinophils Relative: 8 %
HCT: 40.1 % (ref 36.0–46.0)
Hemoglobin: 12.6 g/dL (ref 12.0–15.0)
Immature Granulocytes: 0 %
Lymphocytes Relative: 22 %
Lymphs Abs: 1.5 10*3/uL (ref 0.7–4.0)
MCH: 26.4 pg (ref 26.0–34.0)
MCHC: 31.4 g/dL (ref 30.0–36.0)
MCV: 83.9 fL (ref 80.0–100.0)
Monocytes Absolute: 0.5 10*3/uL (ref 0.1–1.0)
Monocytes Relative: 7 %
Neutro Abs: 4.3 10*3/uL (ref 1.7–7.7)
Neutrophils Relative %: 63 %
Platelets: 214 10*3/uL (ref 150–400)
RBC: 4.78 MIL/uL (ref 3.87–5.11)
RDW: 19 % — ABNORMAL HIGH (ref 11.5–15.5)
WBC: 6.9 10*3/uL (ref 4.0–10.5)
nRBC: 0 % (ref 0.0–0.2)

## 2021-07-17 LAB — COMPREHENSIVE METABOLIC PANEL
ALT: 38 U/L (ref 0–44)
AST: 35 U/L (ref 15–41)
Albumin: 3.6 g/dL (ref 3.5–5.0)
Alkaline Phosphatase: 101 U/L (ref 38–126)
Anion gap: 12 (ref 5–15)
BUN: 8 mg/dL (ref 6–20)
CO2: 21 mmol/L — ABNORMAL LOW (ref 22–32)
Calcium: 8.8 mg/dL — ABNORMAL LOW (ref 8.9–10.3)
Chloride: 108 mmol/L (ref 98–111)
Creatinine, Ser: 0.61 mg/dL (ref 0.44–1.00)
GFR, Estimated: >60 mL/min (ref >60)
Glucose, Bld: 127 mg/dL — ABNORMAL HIGH (ref 70–99)
Potassium: 3.7 mmol/L (ref 3.5–5.1)
Sodium: 141 mmol/L (ref 135–145)
Total Bilirubin: 0.2 mg/dL — ABNORMAL LOW (ref 0.3–1.2)
Total Protein: 7 g/dL (ref 6.5–8.1)

## 2021-07-17 LAB — HCG, SERUM, QUALITATIVE: Preg, Serum: NEGATIVE

## 2021-07-17 LAB — CBG MONITORING, ED: Glucose-Capillary: 217 mg/dL — ABNORMAL HIGH (ref 70–99)

## 2021-07-17 LAB — RESP PANEL BY RT-PCR (FLU A&B, COVID) ARPGX2
Influenza A by PCR: NEGATIVE
Influenza B by PCR: NEGATIVE
SARS Coronavirus 2 by RT PCR: NEGATIVE

## 2021-07-17 LAB — GLUCOSE, CAPILLARY
Glucose-Capillary: 147 mg/dL — ABNORMAL HIGH (ref 70–99)
Glucose-Capillary: 226 mg/dL — ABNORMAL HIGH (ref 70–99)

## 2021-07-17 LAB — HEMOGLOBIN A1C
Hgb A1c MFr Bld: 5.8 % — ABNORMAL HIGH (ref 4.8–5.6)
Mean Plasma Glucose: 119.76 mg/dL

## 2021-07-17 LAB — PROCALCITONIN: Procalcitonin: <0.1 ng/mL

## 2021-07-17 IMAGING — DX DG CHEST 1V SAME DAY
1 series · 1 of 1 positions shown · non-contrast
Comparison: Chest x-ray from same day.

CLINICAL DATA: Follow-up possible foreign body.

EXAM:
CHEST - 1 VIEW SAME DAY

[chest ap]
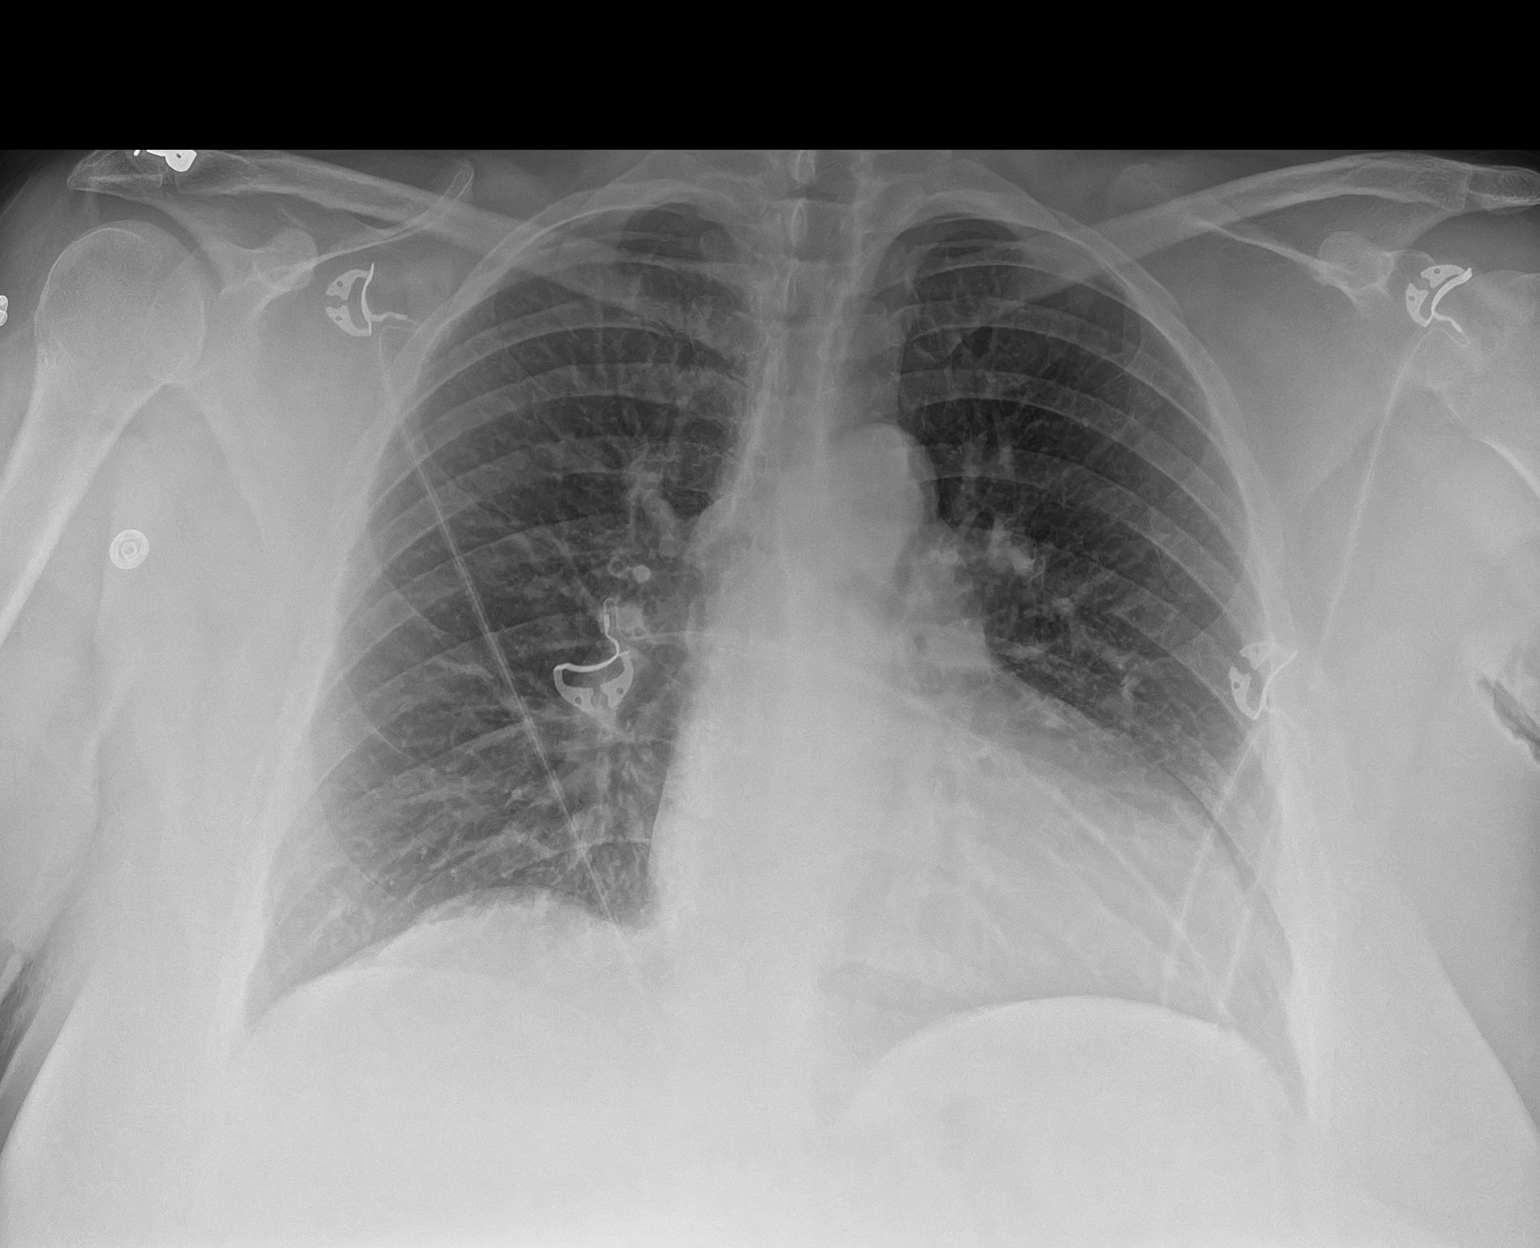

[1 of 1 positions shown; findings below may reference images not displayed]

FINDINGS: Previously seen radiopaque density in the superior mediastinum is no
longer identified. Unchanged mild cardiomegaly. Normal pulmonary
vascularity. No focal consolidation, pleural effusion, or
pneumothorax. No acute osseous abnormality.
IMPRESSION: 1. Previously seen radiopaque density in the superior mediastinum is
no longer identified, consistent with external location.
2. No active disease.

## 2021-07-17 IMAGING — DX DG CHEST 1V PORT
1 series · 1 of 1 positions shown · non-contrast
Comparison: [DATE]

CLINICAL DATA: Dizziness scratch set dyspnea.  Shortness of breath.

EXAM:
PORTABLE CHEST 1 VIEW

[chest ap]
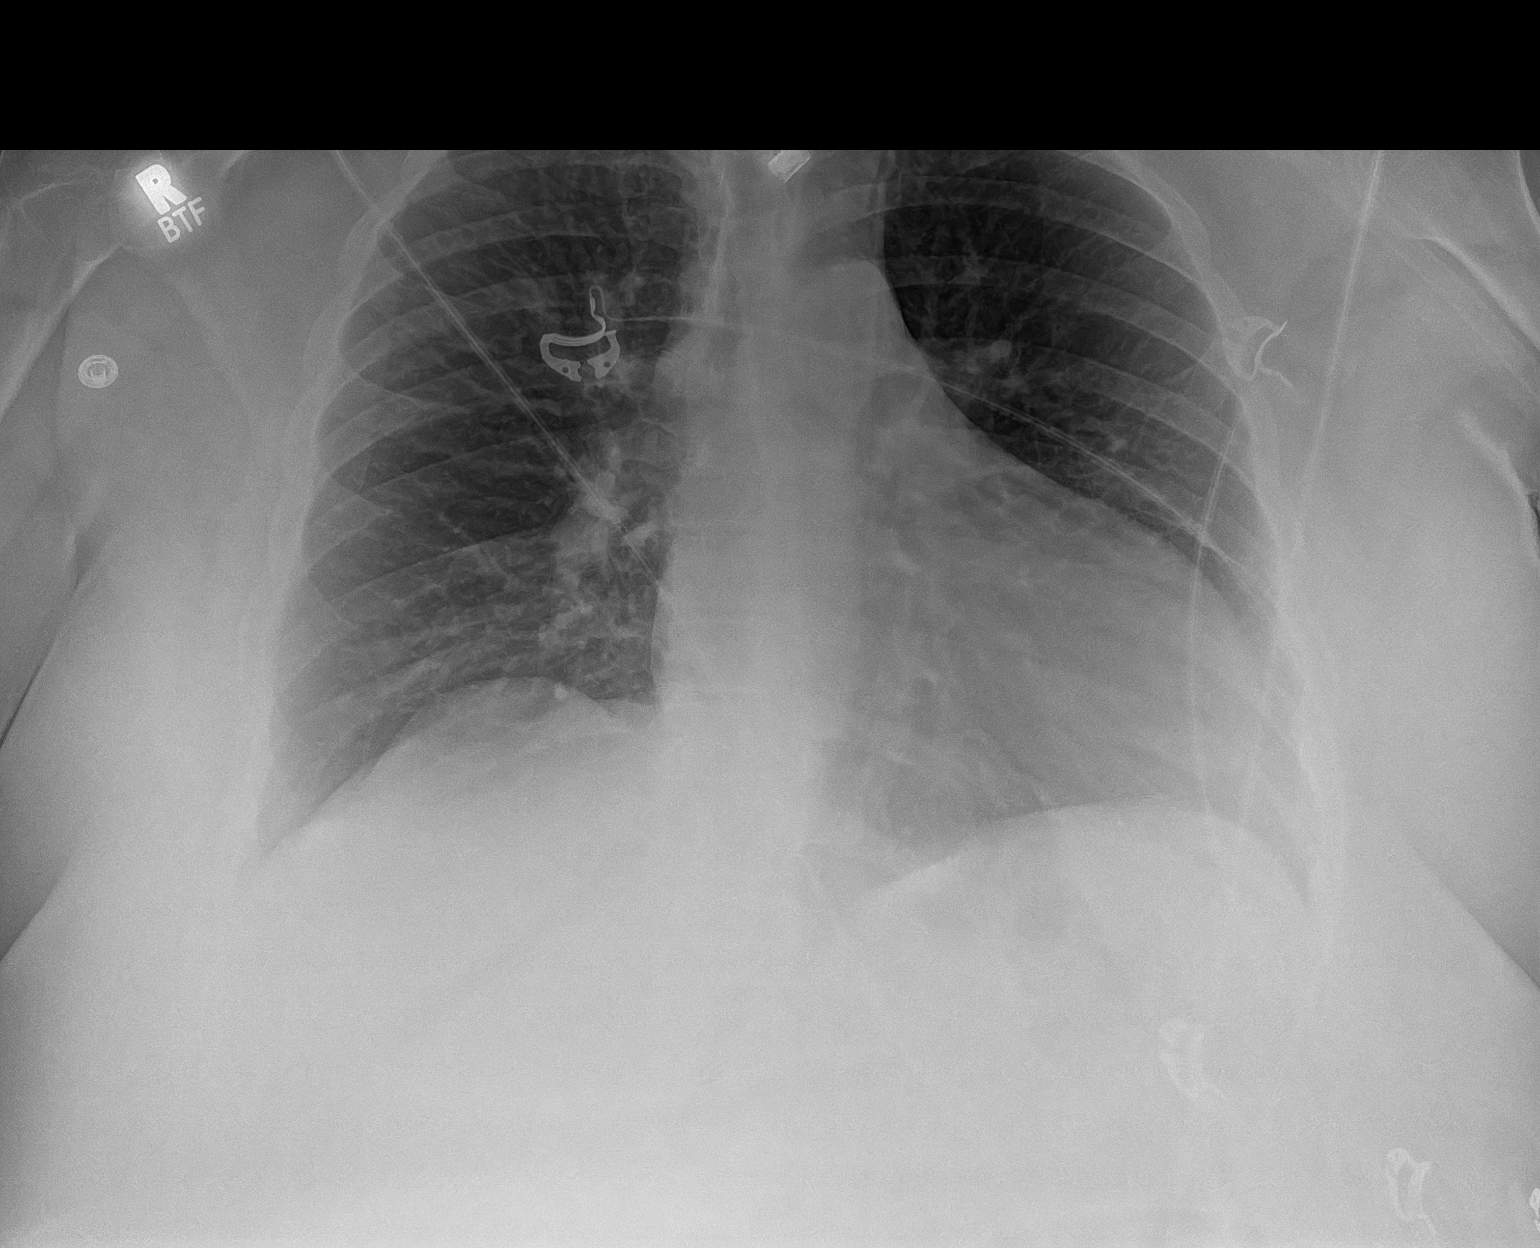

[1 of 1 positions shown; findings below may reference images not displayed]

FINDINGS: Indeterminate radiodensity in the projection of the superior
mediastinum measures 1 cm in length. The exact location cannot be
determined based on single view of the chest.

Stable cardiac enlargement. No pleural effusion or edema. No
airspace opacities identified. Visualized osseous structures appear
unremarkable.
IMPRESSION: 1. Stable cardiac enlargement.
2. Indeterminate radiodensity in the projection of the superior
mediastinum. Although this may be external to the patient, this
cannot be confirmed on single frontal projection of the chest.
Correlation with physical exam findings. If there is a clinical
concern for ingested or aspirated foreign body lateral chest
radiograph may be helpful to determine exact location of this
radiodensity.

## 2021-07-17 MED ORDER — METHYLPREDNISOLONE SODIUM SUCC 125 MG IJ SOLR
125.0000 mg | Freq: Once | INTRAMUSCULAR | Status: AC
  Administered 2021-07-17: 125 mg via INTRAVENOUS
  Filled 2021-07-17: qty 2

## 2021-07-17 MED ORDER — INSULIN ASPART 100 UNIT/ML IJ SOLN
8.0000 [IU] | Freq: Three times a day (TID) | INTRAMUSCULAR | Status: DC
  Administered 2021-07-17 (×2): 8 [IU] via SUBCUTANEOUS
  Filled 2021-07-17: qty 1

## 2021-07-17 MED ORDER — ALBUTEROL SULFATE (2.5 MG/3ML) 0.083% IN NEBU
INHALATION_SOLUTION | RESPIRATORY_TRACT | Status: AC
  Administered 2021-07-17: 15 mg
  Filled 2021-07-17: qty 21

## 2021-07-17 MED ORDER — HYDROXYCHLOROQUINE SULFATE 200 MG PO TABS
200.0000 mg | ORAL_TABLET | Freq: Two times a day (BID) | ORAL | Status: DC
  Administered 2021-07-17 – 2021-07-20 (×6): 200 mg via ORAL
  Filled 2021-07-17 (×13): qty 1

## 2021-07-17 MED ORDER — BUSPIRONE HCL 5 MG PO TABS
7.5000 mg | ORAL_TABLET | Freq: Two times a day (BID) | ORAL | Status: DC
  Administered 2021-07-17 – 2021-07-20 (×7): 7.5 mg via ORAL
  Filled 2021-07-17 (×7): qty 2

## 2021-07-17 MED ORDER — VITAMIN D 25 MCG (1000 UNIT) PO TABS
5000.0000 [IU] | ORAL_TABLET | Freq: Every day | ORAL | Status: DC
  Administered 2021-07-17 – 2021-07-20 (×4): 5000 [IU] via ORAL
  Filled 2021-07-17 (×5): qty 5

## 2021-07-17 MED ORDER — QUETIAPINE FUMARATE 25 MG PO TABS
25.0000 mg | ORAL_TABLET | Freq: Two times a day (BID) | ORAL | Status: DC
  Administered 2021-07-17 – 2021-07-20 (×6): 25 mg via ORAL
  Filled 2021-07-17 (×6): qty 1

## 2021-07-17 MED ORDER — OXYCODONE HCL 5 MG PO TABS
5.0000 mg | ORAL_TABLET | ORAL | Status: DC | PRN
  Administered 2021-07-17 – 2021-07-20 (×7): 5 mg via ORAL
  Filled 2021-07-17 (×8): qty 1

## 2021-07-17 MED ORDER — INSULIN ASPART 100 UNIT/ML IJ SOLN
0.0000 [IU] | Freq: Three times a day (TID) | INTRAMUSCULAR | Status: DC
  Administered 2021-07-17: 3 [IU] via SUBCUTANEOUS
  Administered 2021-07-17: 7 [IU] via SUBCUTANEOUS
  Administered 2021-07-18: 4 [IU] via SUBCUTANEOUS
  Administered 2021-07-18: 7 [IU] via SUBCUTANEOUS
  Administered 2021-07-18 – 2021-07-19 (×2): 3 [IU] via SUBCUTANEOUS
  Administered 2021-07-19: 4 [IU] via SUBCUTANEOUS
  Administered 2021-07-20 (×2): 3 [IU] via SUBCUTANEOUS
  Filled 2021-07-17: qty 1

## 2021-07-17 MED ORDER — METHYLPREDNISOLONE SODIUM SUCC 125 MG IJ SOLR
60.0000 mg | Freq: Two times a day (BID) | INTRAMUSCULAR | Status: DC
  Administered 2021-07-17 – 2021-07-20 (×6): 60 mg via INTRAVENOUS
  Filled 2021-07-17 (×6): qty 2

## 2021-07-17 MED ORDER — IPRATROPIUM BROMIDE 0.02 % IN SOLN
RESPIRATORY_TRACT | Status: AC
  Administered 2021-07-17: 0.5 mg
  Filled 2021-07-17: qty 2.5

## 2021-07-17 MED ORDER — ALBUTEROL (5 MG/ML) CONTINUOUS INHALATION SOLN
15.0000 mg/h | INHALATION_SOLUTION | Freq: Once | RESPIRATORY_TRACT | Status: DC
  Filled 2021-07-17: qty 20

## 2021-07-17 MED ORDER — CHLORHEXIDINE GLUCONATE CLOTH 2 % EX PADS
6.0000 | MEDICATED_PAD | Freq: Every day | CUTANEOUS | Status: DC
  Administered 2021-07-18 – 2021-07-23 (×6): 6 via TOPICAL

## 2021-07-17 MED ORDER — BISACODYL 5 MG PO TBEC: 5.0000 mg | DELAYED_RELEASE_TABLET | Freq: Every day | ORAL | Status: DC | PRN

## 2021-07-17 MED ORDER — ONDANSETRON HCL 4 MG/2ML IJ SOLN
4.0000 mg | Freq: Four times a day (QID) | INTRAMUSCULAR | Status: DC | PRN
Start: 2021-07-17 — End: 2021-07-25
  Administered 2021-07-23 – 2021-07-25 (×2): 4 mg via INTRAVENOUS
  Filled 2021-07-17 (×2): qty 2

## 2021-07-17 MED ORDER — ONDANSETRON HCL 4 MG PO TABS: 4.0000 mg | ORAL_TABLET | Freq: Four times a day (QID) | ORAL | Status: DC | PRN

## 2021-07-17 MED ORDER — INSULIN ASPART 100 UNIT/ML IJ SOLN
0.0000 [IU] | Freq: Every day | INTRAMUSCULAR | Status: DC
  Administered 2021-07-17 – 2021-07-18 (×2): 2 [IU] via SUBCUTANEOUS

## 2021-07-17 MED ORDER — ARFORMOTEROL TARTRATE 15 MCG/2ML IN NEBU
15.0000 ug | INHALATION_SOLUTION | Freq: Two times a day (BID) | RESPIRATORY_TRACT | Status: DC
  Administered 2021-07-17 – 2021-07-25 (×16): 15 ug via RESPIRATORY_TRACT
  Filled 2021-07-17 (×18): qty 2

## 2021-07-17 MED ORDER — DULOXETINE HCL 30 MG PO CPEP
60.0000 mg | ORAL_CAPSULE | Freq: Every day | ORAL | Status: DC
Start: 2021-07-17 — End: 2021-07-21
  Administered 2021-07-17 – 2021-07-20 (×4): 60 mg via ORAL
  Filled 2021-07-17: qty 1
  Filled 2021-07-17 (×2): qty 2
  Filled 2021-07-17 (×2): qty 1

## 2021-07-17 MED ORDER — FLUTICASONE PROPIONATE 50 MCG/ACT NA SUSP
2.0000 | Freq: Every day | NASAL | Status: DC
  Administered 2021-07-17: 2 via NASAL
  Filled 2021-07-17: qty 16

## 2021-07-17 MED ORDER — ALBUTEROL SULFATE (2.5 MG/3ML) 0.083% IN NEBU
2.5000 mg | INHALATION_SOLUTION | RESPIRATORY_TRACT | Status: DC
  Administered 2021-07-17 – 2021-07-24 (×43): 2.5 mg via RESPIRATORY_TRACT
  Filled 2021-07-17 (×45): qty 3

## 2021-07-17 MED ORDER — LEVETIRACETAM 500 MG PO TABS
500.0000 mg | ORAL_TABLET | Freq: Two times a day (BID) | ORAL | Status: DC
  Administered 2021-07-17 – 2021-07-20 (×7): 500 mg via ORAL
  Filled 2021-07-17 (×7): qty 1

## 2021-07-17 MED ORDER — ACETAMINOPHEN 325 MG PO TABS
650.0000 mg | ORAL_TABLET | Freq: Four times a day (QID) | ORAL | Status: DC | PRN
  Administered 2021-07-17 – 2021-07-19 (×2): 650 mg via ORAL
  Filled 2021-07-17 (×2): qty 2

## 2021-07-17 MED ORDER — CYCLOBENZAPRINE HCL 10 MG PO TABS
5.0000 mg | ORAL_TABLET | Freq: Three times a day (TID) | ORAL | Status: DC | PRN
  Administered 2021-07-19 (×2): 5 mg via ORAL
  Filled 2021-07-17 (×3): qty 1

## 2021-07-17 MED ORDER — PREGABALIN 50 MG PO CAPS
100.0000 mg | ORAL_CAPSULE | Freq: Two times a day (BID) | ORAL | Status: DC
  Administered 2021-07-17 – 2021-07-20 (×6): 100 mg via ORAL
  Filled 2021-07-17 (×2): qty 1
  Filled 2021-07-17: qty 2
  Filled 2021-07-17: qty 1
  Filled 2021-07-17: qty 2
  Filled 2021-07-17: qty 1

## 2021-07-17 MED ORDER — MAGNESIUM SULFATE 2 GM/50ML IV SOLN
2.0000 g | Freq: Once | INTRAVENOUS | Status: AC
  Administered 2021-07-17: 2 g via INTRAVENOUS
  Filled 2021-07-17: qty 50

## 2021-07-17 MED ORDER — ALBUTEROL SULFATE (2.5 MG/3ML) 0.083% IN NEBU
INHALATION_SOLUTION | RESPIRATORY_TRACT | Status: AC
  Administered 2021-07-17: 10 mg
  Filled 2021-07-17: qty 12

## 2021-07-17 MED ORDER — MONTELUKAST SODIUM 10 MG PO TABS
10.0000 mg | ORAL_TABLET | Freq: Every day | ORAL | Status: DC
  Administered 2021-07-17 – 2021-07-19 (×3): 10 mg via ORAL
  Filled 2021-07-17 (×3): qty 1

## 2021-07-17 MED ORDER — IPRATROPIUM BROMIDE 0.02 % IN SOLN
1.0000 mg | Freq: Once | RESPIRATORY_TRACT | Status: AC
  Administered 2021-07-17: 1 mg via RESPIRATORY_TRACT
  Filled 2021-07-17: qty 5

## 2021-07-17 MED ORDER — LORATADINE 10 MG PO TABS
10.0000 mg | ORAL_TABLET | Freq: Every day | ORAL | Status: DC
  Administered 2021-07-17 – 2021-07-20 (×4): 10 mg via ORAL
  Filled 2021-07-17 (×4): qty 1

## 2021-07-17 MED ORDER — HYDROCHLOROTHIAZIDE 25 MG PO TABS
25.0000 mg | ORAL_TABLET | Freq: Every day | ORAL | Status: DC
Start: 2021-07-17 — End: 2021-07-20
  Administered 2021-07-17 – 2021-07-20 (×4): 25 mg via ORAL
  Filled 2021-07-17 (×4): qty 1

## 2021-07-17 MED ORDER — ACETAMINOPHEN 650 MG RE SUPP: 650.0000 mg | Freq: Four times a day (QID) | RECTAL | Status: DC | PRN

## 2021-07-17 MED ORDER — BUDESONIDE 0.25 MG/2ML IN SUSP
0.2500 mg | Freq: Two times a day (BID) | RESPIRATORY_TRACT | Status: DC
  Administered 2021-07-17 – 2021-07-20 (×6): 0.25 mg via RESPIRATORY_TRACT
  Filled 2021-07-17 (×6): qty 2

## 2021-07-17 MED ORDER — REVEFENACIN 175 MCG/3ML IN SOLN
175.0000 ug | Freq: Every day | RESPIRATORY_TRACT | Status: DC
  Administered 2021-07-17 – 2021-07-25 (×9): 175 ug via RESPIRATORY_TRACT
  Filled 2021-07-17 (×11): qty 3

## 2021-07-17 MED ORDER — PANTOPRAZOLE SODIUM 40 MG PO TBEC
40.0000 mg | DELAYED_RELEASE_TABLET | Freq: Two times a day (BID) | ORAL | Status: DC
  Administered 2021-07-17 – 2021-07-18 (×3): 40 mg via ORAL
  Filled 2021-07-17 (×3): qty 1

## 2021-07-17 MED ORDER — ENOXAPARIN SODIUM 60 MG/0.6ML IJ SOSY
60.0000 mg | PREFILLED_SYRINGE | Freq: Every day | INTRAMUSCULAR | Status: DC
  Administered 2021-07-17 – 2021-07-25 (×9): 60 mg via SUBCUTANEOUS
  Filled 2021-07-17 (×9): qty 0.6

## 2021-07-17 NOTE — Assessment & Plan Note (Signed)
--  protonix ordered for GI protection  ?

## 2021-07-17 NOTE — Hospital Course (Addendum)
46 year old female with long history of severe persistent allergic asthma with history of multiple hospitalizations for asthma exacerbation in addition to history of intubation from asthma most recently discharged from the hospital in April 2022 for asthma exacerbation.  She presented to the emergency department with 3 days of progressive wheezing dyspnea and lightheadedness.  She has been seen by Dr. Halford Chessman in pulmonology who reported that breztri was not effective for her and yupelri was too expensive. She had been placed on a trial of Trelegy 200 one puff daily at her last office visit.  She said that she had been doing fairly well on trelegy until the weather started warming up and pollen counts began to rise.  She is having a persistent cough and chest congestion and loud audible wheezing with associated dyspnea on exertion.  She denies fever and chills.  She has OSA and is compliant with nightly CPAP.  She also has rheumatoid arthritis and has been on Plaquenil in the past and followed by Dr. [name] with [facility].  She also has morbid obesity and has had [facility].    Pt was placed on continuous albuterol bronchodilator therapy in ED on arrival.  She had some improvement but not back to baseline and with her history of rapid decompensation it was thought best practice that she be monitored in hospital under observation.  Her CXR did not show any findings of pneumonia.  Her sars 2 coronavirus testing was negative.    07/18/2021 : reports not feeling any better, still with excess wheezing, pulmonary consult requested. Dr. Melvyn Novas will see her today.   07/19/2021:  some improvement but not back to baseline.  Ok to transfer out of stepdown ICU.  Continue current management.    07/20/2021:  Pt had severe SOB when lying recumbent.  Still with diffuse wheezing, but patient reports overall some improvement from admission. Had some pleuritic chest pain  symptoms from coughing.  CXR and EKG no acute changes.  2D echo ordered.

## 2021-07-17 NOTE — Assessment & Plan Note (Addendum)
-  continue nightly CPAP therapy - she has been compliant using in hospital

## 2021-07-17 NOTE — Assessment & Plan Note (Signed)
-   Pt had plans to follow up with [facility].

## 2021-07-17 NOTE — Assessment & Plan Note (Signed)
-   monitoring closely with daily labs.Marland KitchenMarland Kitchen

## 2021-07-17 NOTE — Progress Notes (Signed)
Pt's CAT completed.  BBS wheezes inspiratory and expiratory with good air movement.  HR 116, sats 97% on RA.  RT will continue to monitor.  PT states she feels a little better.

## 2021-07-17 NOTE — Assessment & Plan Note (Addendum)
-   continue home HCTZ therapy/add amlodipine 5 mg

## 2021-07-17 NOTE — Assessment & Plan Note (Addendum)
-   pt having steroid induced hyperglycemia - ordered SSI coverage and frequent CBG monitoring - increased prandial novolog to 12 units TIDAC, resistant SSI, add semglee 30 units CBG (last 3)  Recent Labs    07/20/21 0256 07/20/21 0731 07/20/21 1117  GLUCAP 157* 149* 149*

## 2021-07-17 NOTE — H&P (Signed)
History and Physical    Patient: Elizabeth Bautista DJS:[id number] DOB: [birth year] DOA: 07/17/2021 DOS: the patient was seen and examined on 07/17/2021 PCP: [name], MD  Patient coming from: Home  Chief Complaint:  Chief Complaint  Patient presents with   Asthma    HPI: Elizabeth Bautista is a 46 year old female with long history of severe persistent allergic asthma with history of multiple hospitalizations for asthma exacerbation in addition to history of intubation from asthma most recently discharged from the hospital in April 2022 for asthma exacerbation.  She presented to the emergency department with 3 days of progressive wheezing dyspnea and lightheadedness.  She has been seen by Dr. Halford Chessman in pulmonology who reported that breztri was not effective for her and yupelri was too expensive. She had been placed on a trial of Trelegy 200 one puff daily at her last office visit.  She said that she had been doing fairly well on trelegy until the weather started warming up and pollen counts began to rise.  She is having a persistent cough and chest congestion and loud audible wheezing with associated dyspnea on exertion.  She denies fever and chills.  She has OSA and is compliant with nightly CPAP.  She also has rheumatoid arthritis and has been on Plaquenil in the past and followed by Dr. [name] with [facility].  She also has morbid obesity and has had [facility].    Pt was placed on continuous albuterol bronchodilator therapy in ED on arrival.  She had some improvement but not back to baseline and with her history of rapid decompensation it was thought best practice that she be monitored in hospital under observation.  Her CXR did not show any findings of pneumonia.  Her sars 2 coronavirus testing was negative.    Review of Systems: Review of Systems  Constitutional: Negative.   HENT: Negative.    Eyes: Negative.   Respiratory:  Positive for  cough, sputum production, shortness of breath and wheezing.   Cardiovascular:  Positive for palpitations.  Gastrointestinal:  Positive for heartburn. Negative for abdominal pain, constipation, diarrhea, nausea and vomiting.  Genitourinary: Negative.   Musculoskeletal:  Positive for joint pain and myalgias.  Skin: Negative.   Neurological:  Positive for tingling and seizures. Negative for focal weakness and loss of consciousness.  Endo/Heme/Allergies: Negative.   Psychiatric/Behavioral:  Positive for depression. The patient is nervous/anxious.   All other systems reviewed and are negative.  Past Medical History:  Diagnosis Date   Anemia    Asthma    COVID-19 virus infection 05/16/2020   04/27/20 dx covid-19, not hospitalized   Diabetes ([place])    on Metformin   Eczema    Edema    Heart problem    "something with the arteries on the left side of the heart"; upcoming appt with cardiology for evaluation   HTN (hypertension)    Rheumatoid arthritis ([place])    on Plaquenil    Seizures ([place])    last seizure 2019   Sleep apnea    Sleep apnea    Past Surgical History:  Procedure Laterality Date   APPENDECTOMY     CESAREAN SECTION     x3   Social History:  reports that she quit smoking about 8 years ago. Her smoking use included cigarettes. She has never used smokeless tobacco. She reports current alcohol use. She reports that she does not currently use drugs.  Allergies  Allergen Reactions  Ferumoxytol Anaphylaxis    ~5 years ago. Tolerated IV venofer   Phenytoin Anaphylaxis    Other reaction(s): swelling and heart rate decreased   Pecan Nut (Diagnostic) Other (See Comments)   Phenylbutazones Other (See Comments)    Family History  Problem Relation Age of Onset   High blood pressure Mother    Rheum arthritis Mother    Diabetes Father    Diabetes Sister    Heart Problems Sister    Diabetes Brother    Diabetes Paternal Grandmother    Diabetes Other        father's side  "everybody died from Diabetes"   High blood pressure Other        mother's side, multiple siblings with this    Heart attack Other        family member on mother's side    Diabetes Paternal Aunt    Seizures Cousin        not sibings to the other cousins with seizures   Breast cancer Cousin    Seizures Cousin        not sibings to the other cousins with seizures   Seizures Cousin        not sibings to the other cousins with seizures   Cervical cancer Maternal Aunt    Breast cancer Cousin    Dementia Maternal Aunt    Asthma Maternal Aunt    Heart Problems Maternal Grandmother    Diabetes Brother    Asthma Daughter    Angioedema Daughter    Asthma Son     Prior to Admission medications   Medication Sig Start Date End Date Taking? Authorizing Provider  albuterol (PROAIR HFA) 108 (90 Base) MCG/ACT inhaler 2 puffs every 4 hours as needed only  if your can't catch your breath Patient taking differently: Inhale 1-2 puffs into the lungs every 4 (four) hours as needed for wheezing or shortness of breath (only if you can't catch your breath). 08/22/20  Yes [name], MD  albuterol (PROVENTIL) (2.5 MG/3ML) 0.083% nebulizer solution Take 3 mLs (2.5 mg total) by nebulization every 6 (six) hours as needed for wheezing or shortness of breath. 09/18/20  Yes [name], MD  amoxicillin-clavulanate (AUGMENTIN) 875-125 MG tablet Take 1 tablet by mouth 2 (two) times daily. Patient not taking: Reported on 07/17/2021 05/30/21   [name], MD  azelastine (ASTELIN) 0.1 % nasal spray Place 1 spray into both nostrils 2 (two) times daily. Use in each nostril as directed Patient not taking: Reported on 07/17/2021 02/22/21   Chesley Mires, MD  blood glucose meter kit and supplies Dispense based on patient and insurance preference. Use up to four times daily as directed. (FOR ICD-10 E10.9, E11.9). 02/01/20   Ailene Ards, NP  Blood Pressure Monitoring [facility]) MISC 1 each by Does not apply  route daily. 01/18/20   Ailene Ards, NP  busPIRone (BUSPAR) 7.5 MG tablet Take 7.5 mg by mouth 2 (two) times daily. 09/05/19   [provider]  cetirizine (ZYRTEC) 10 MG tablet Take 1 tablet (10 mg total) by mouth daily. 06/08/20   Valentina Shaggy, MD  Cholecalciferol 1.25 MG (50000 UT) TABS Take 1 tablet by mouth daily. 02/01/20   Ailene Ards, NP  cyclobenzaprine (FLEXERIL) 10 MG tablet Take 10 mg by mouth 3 (three) times daily as needed for muscle spasms.  08/27/19   [provider]  DULoxetine (CYMBALTA) 60 MG capsule Take 60 mg by mouth daily. 04/17/21  [provider]  EPINEPHrine 0.3 mg/0.3 mL IJ SOAJ injection Inject 0.3 mg into the muscle as needed for anaphylaxis. 09/26/20   Chesley Mires, MD  escitalopram (LEXAPRO) 20 MG tablet Take 1 tablet by mouth every evening. 02/29/20   [provider]  famotidine (PEPCID) 20 MG tablet Take 20 mg by mouth daily. 07/31/19   [provider]  ferrous sulfate 325 (65 FE) MG tablet TAKE 1 TABLET BY MOUTH TWICE A DAY 11/29/20   [name], MD  fluticasone (CUTIVATE) 0.005 % ointment Apply 1 application topically daily. 10/11/19   [provider]  fluticasone (FLONASE) 50 MCG/ACT nasal spray SPRAY 1 SPRAY INTO BOTH NOSTRILS DAILY. 11/14/20   Valentina Shaggy, MD  Fluticasone-Umeclidin-Vilant (TRELEGY ELLIPTA) 200-62.5-25 MCG/INH AEPB Inhale 1 puff into the lungs daily. 02/22/21   Chesley Mires, MD  hydrochlorothiazide (HYDRODIURIL) 25 MG tablet Take 1 tablet (25 mg total) by mouth daily. For high blood pressure. 05/16/21   [name], MD  hydroxychloroquine (PLAQUENIL) 200 MG tablet TAKE 1 TABLET BY MOUTH TWICE A DAY 07/11/21   Rice, Resa Miner, MD  levETIRAcetam (KEPPRA) 500 MG tablet Take 1 tablet (500 mg total) by mouth 2 (two) times daily. 03/13/20 04/12/20  Manuella Ghazi, Pratik D, DO  metFORMIN (GLUCOPHAGE XR) 500 MG 24 hr tablet Take 1 tablet (500 mg total) by mouth daily with breakfast. 05/16/21    [name], MD  montelukast (SINGULAIR) 10 MG tablet Take 1 tablet (10 mg total) by mouth at bedtime. 10/01/20 10/31/20  ShahmehdiValeria Batman, MD  pantoprazole (PROTONIX) 40 MG tablet Take 1 tablet (40 mg total) by mouth daily. Take 30-60 min before first meal of the day 02/09/20   Tanda Rockers, MD  pregabalin (LYRICA) 150 MG capsule Take 1 capsule (150 mg total) by mouth 2 (two) times daily. 07/11/21   Kirsteins, Luanna Salk, MD  promethazine (PHENERGAN) 25 MG tablet Take 1 tablet (25 mg total) by mouth every 6 (six) hours as needed for nausea or vomiting. 10/17/20   Estill Dooms, NP  QUEtiapine (SEROQUEL) 25 MG tablet Take 25 mg by mouth 2 (two) times daily.  09/05/19   [provider]  SUMAtriptan (IMITREX) 50 MG tablet TAKE 1 TABLET EVERY 2 HOURS AS NEEDED FOR MIGRAINE OR HEADACHE. 04/18/21   [name], MD  triamcinolone (KENALOG) 0.025 % ointment APPLY TO AFFECTED AREA TWICE A DAY 12/26/20   [name], MD  triamcinolone ointment (KENALOG) 0.5 % Apply 1 application topically as needed. 08/22/20   [name], MD    Physical Exam: Vitals:   07/17/21 0740 07/17/21 0800 07/17/21 0830 07/17/21 1030  BP: (!) 138/50 (!) 147/106 (!) 141/78 (!) 142/84  Pulse: 95 92 90 (!) 112  Resp: 17 (!) 27 19 (!) 39  Temp:      SpO2: 99% 97% 100% 94%  Weight:      Height:       Physical Exam Vitals and nursing note reviewed.  Constitutional:      General: She is not in acute distress.    Appearance: She is obese. She is ill-appearing. She is not toxic-appearing or diaphoretic.     Interventions: She is not intubated. HENT:     Head: Normocephalic and atraumatic.     Nose: Rhinorrhea present. No congestion.     Mouth/Throat:     Mouth: Mucous membranes are moist.     Pharynx: No oropharyngeal exudate or posterior oropharyngeal erythema.  Eyes:  Extraocular Movements: Extraocular movements intact.     Pupils: Pupils are equal, round, and reactive to light.  Cardiovascular:      Rate and Rhythm: Tachycardia present.     Heart sounds: Normal heart sounds.  Pulmonary:     Effort: Tachypnea, accessory muscle usage and prolonged expiration present. No respiratory distress or retractions. She is not intubated.     Breath sounds: Stridor and transmitted upper airway sounds present. No decreased air movement. Examination of the right-upper field reveals wheezing and rales. Examination of the left-upper field reveals wheezing and rhonchi. Examination of the right-lower field reveals wheezing. Examination of the left-lower field reveals wheezing. Wheezing, rhonchi and rales present. No decreased breath sounds.  Abdominal:     General: Bowel sounds are normal. There is no distension.     Palpations: Abdomen is soft.     Tenderness: There is no abdominal tenderness. There is no guarding.  Musculoskeletal:     Cervical back: Normal range of motion and neck supple.  Skin:    General: Skin is warm and dry.     Capillary Refill: Capillary refill takes less than 2 seconds.     Coloration: Skin is not jaundiced.     Findings: No erythema.  Neurological:     General: No focal deficit present.     Mental Status: She is alert and oriented to person, place, and time.     Cranial Nerves: No cranial nerve deficit.     Motor: No weakness.  Psychiatric:        Mood and Affect: Mood normal.        Thought Content: Thought content normal.   Data Reviewed:  All admission labs and tests have been reviewed.  Assessment and Plan: * Severe asthma with exacerbation- (present on admission) - recurrent severe asthma exacerbation - she is followed by [facility] - Dr Halford Chessman Maretta Bees noted to be too expensive.  - At last OV was placed on trial of trelegy  - admit for IV steroids, bronchodilators and respiratory support.  - remains full code    High risk medication use - monitoring closely with daily labs...  History of seizures - resume home keppra 500 mg BID   Anxiety-  (present on admission) - Resume home behavioral health medications.   GERD (gastroesophageal reflux disease)- (present on admission) - protonix ordered for GI protection   Type 2 diabetes mellitus with hyperglycemia, without long-term current use of insulin ([place])- (present on admission) - anticipating steroid induced hyperglycemia - ordered SSI coverage and frequent CBG monitoring  Vitamin D deficiency- (present on admission) - restarted daily vitamin D supplementation   Essential hypertension- (present on admission) - resume home HCTZ therapy.   Class 3 severe obesity with serious comorbidity and body mass index (BMI) of 45.0 to 49.9 in adult [facility]) - Pt had plans to follow up with [facility].   Seropositive rheumatoid arthritis ([place])- (present on admission) - She is followed by an outpatient rheumatologist - Resume home plaquenil therapy   OSA on CPAP -continue nightly CPAP therapy    Advance Care Planning:   Code Status: Full Code   Consults:   Family Communication: patient updated with plan of care at bedside   Severity of Illness: The appropriate patient status for this patient is OBSERVATION. Observation status is judged to be reasonable and necessary in order to provide the required intensity of service to ensure the patient's safety. The patient's presenting symptoms, physical exam findings, and  initial radiographic and laboratory data in the context of their medical condition is felt to place them at decreased risk for further clinical deterioration. Furthermore, it is anticipated that the patient will be medically stable for discharge from the hospital within 2 midnights of admission.   Author: Irwin Brakeman, MD 07/17/2021 11:00 AM How to contact the [facility] Attending or Consulting provider [place] or covering provider during after hours [place], for this patient?  Check the care team in [facility] and look for a) attending/consulting TR[facility] provider listed  and b) the [facility] team listed Log into [email or web address] and use [facility]'s universal password to access. If you do not have the password, please contact the hospital operator. Locate the [facility] provider you are looking for under [facility] and page to a number that you can be directly reached. If you still have difficulty reaching the provider, please page the [facility] (Director on Call) for the Hospitalists listed on amion for assistance.   For on call review [email or web address].

## 2021-07-17 NOTE — Assessment & Plan Note (Addendum)
-   She is followed by an outpatient rheumatologist - Resume home plaquenil therapy

## 2021-07-17 NOTE — Progress Notes (Signed)
PHARMACIST - PHYSICIAN COMMUNICATION  CONCERNING:  Enoxaparin (Lovenox) for DVT Prophylaxis    RECOMMENDATION: Patient was prescribed enoxaprin 40mg  q24 hours for VTE prophylaxis.   Filed Weights   07/17/21 0644  Weight: 125 kg (275 lb 9.2 oz)    Body mass index is 47.3 kg/m.  Estimated Creatinine Clearance: 113.6 mL/min (by C-G formula based on SCr of 0.61 mg/dL).   Based on [facility] policy patient is candidate for enoxaparin 0.5mg /kg TBW SQ every 24 hours based on BMI being >30.  DESCRIPTION: Pharmacy has adjusted enoxaparin dose per [facility] policy.  Patient is now receiving enoxaparin 60 mg every 24 hours    Wayland Denis, PharmD Clinical Pharmacist  07/17/2021 10:44 AM

## 2021-07-17 NOTE — Assessment & Plan Note (Addendum)
-   recurrent severe asthma exacerbation - she is followed by [facility] - Dr Craige Cotta Mikael Spray noted to be too expensive for outpatient use.  - At last OV was placed on trial of trelegy which she reports is helping.  - continue IV steroids, bronchodilators and respiratory support.  - remains full code  - Dr. Sherene Sires consulted and recommended sending home on stiolto and stopping trelegy.   - 2D echo ordered 2/18 due to dyspnea exacerbated lying flat.

## 2021-07-17 NOTE — Progress Notes (Signed)
Put CPAP together for patient and it is at bedside with her settings programmed.  Patient likes to put herself on when she is ready for bed.  Again, notified patient of how to turn on machine.  Setting at 5 of pressure.

## 2021-07-17 NOTE — ED Triage Notes (Signed)
Pt POV with extreme wheezing. Pt has asthma and stats it feels like she is having a attack. Denies sickness. Pt has taken breathing treatments at home with no relief.

## 2021-07-17 NOTE — ED Notes (Signed)
Pt is inspiratory and expiratory wheezing. EDP made aware of pt breathing and condition. Respiratory is at bedside at this time to assess pt.

## 2021-07-17 NOTE — Assessment & Plan Note (Signed)
-   restarted daily vitamin D supplementation

## 2021-07-17 NOTE — Assessment & Plan Note (Addendum)
-   Resumed home behavioral health medications.

## 2021-07-17 NOTE — ED Provider Notes (Signed)
[facility] EMERGENCY DEPARTMENT Provider Note   CSN: [id number] Arrival date & time: 07/17/21  7616     History  Chief Complaint  Patient presents with   Asthma    Elizabeth Bautista is a 46 y.o. female.  HPI 46 year old female presents with shortness of breath.  It started yesterday.  Feels like prior asthma exacerbations.  She is tried nebulizers at home a couple times with no relief.  She presents today with severe shortness of breath.  Feels like times where she has had to be admitted.  No fevers, vomiting, or cough.  Her chest is tight and she has been having shortness of breath and wheezing.  No leg swelling or sick contacts.  Home Medications Prior to Admission medications   Medication Sig Start Date End Date Taking? Authorizing Provider  albuterol (PROAIR HFA) 108 (90 Base) MCG/ACT inhaler 2 puffs every 4 hours as needed only  if your can't catch your breath 08/22/20   [name], MD  albuterol (PROVENTIL) (2.5 MG/3ML) 0.083% nebulizer solution Take 3 mLs (2.5 mg total) by nebulization every 6 (six) hours as needed for wheezing or shortness of breath. 09/18/20   [name], MD  amoxicillin-clavulanate (AUGMENTIN) 875-125 MG tablet Take 1 tablet by mouth 2 (two) times daily. 05/30/21   [name], MD  azelastine (ASTELIN) 0.1 % nasal spray Place 1 spray into both nostrils 2 (two) times daily. Use in each nostril as directed 02/22/21   Chesley Mires, MD  blood glucose meter kit and supplies Dispense based on patient and insurance preference. Use up to four times daily as directed. (FOR ICD-10 E10.9, E11.9). 02/01/20   Ailene Ards, NP  Blood Pressure Monitoring [facility]) MISC 1 each by Does not apply route daily. 01/18/20   Ailene Ards, NP  busPIRone (BUSPAR) 7.5 MG tablet Take 7.5 mg by mouth 2 (two) times daily. 09/05/19   [provider]  cetirizine (ZYRTEC) 10 MG tablet Take 1 tablet (10 mg total) by mouth daily. 06/08/20   Valentina Shaggy, MD   Cholecalciferol 1.25 MG (50000 UT) TABS Take 1 tablet by mouth daily. 02/01/20   Ailene Ards, NP  cyclobenzaprine (FLEXERIL) 10 MG tablet Take 10 mg by mouth 3 (three) times daily as needed for muscle spasms.  08/27/19   [provider]  DULoxetine (CYMBALTA) 60 MG capsule Take 60 mg by mouth daily. 04/17/21   [provider]  EPINEPHrine 0.3 mg/0.3 mL IJ SOAJ injection Inject 0.3 mg into the muscle as needed for anaphylaxis. 09/26/20   Chesley Mires, MD  escitalopram (LEXAPRO) 20 MG tablet Take 1 tablet by mouth every evening. 02/29/20   [provider]  famotidine (PEPCID) 20 MG tablet Take 20 mg by mouth daily. 07/31/19   [provider]  ferrous sulfate 325 (65 FE) MG tablet TAKE 1 TABLET BY MOUTH TWICE A DAY 11/29/20   [name], MD  fluticasone (CUTIVATE) 0.005 % ointment Apply 1 application topically daily. 10/11/19   [provider]  fluticasone (FLONASE) 50 MCG/ACT nasal spray SPRAY 1 SPRAY INTO BOTH NOSTRILS DAILY. 11/14/20   Valentina Shaggy, MD  Fluticasone-Umeclidin-Vilant (TRELEGY ELLIPTA) 200-62.5-25 MCG/INH AEPB Inhale 1 puff into the lungs daily. 02/22/21   Chesley Mires, MD  hydrochlorothiazide (HYDRODIURIL) 25 MG tablet Take 1 tablet (25 mg total) by mouth daily. For high blood pressure. 05/16/21   [name], MD  hydroxychloroquine (PLAQUENIL) 200 MG tablet TAKE 1 TABLET BY MOUTH  TWICE A DAY 07/11/21   Rice, Resa Miner, MD  levETIRAcetam (KEPPRA) 500 MG tablet Take 1 tablet (500 mg total) by mouth 2 (two) times daily. 03/13/20 04/12/20  Manuella Ghazi, Pratik D, DO  metFORMIN (GLUCOPHAGE XR) 500 MG 24 hr tablet Take 1 tablet (500 mg total) by mouth daily with breakfast. 05/16/21   [name], MD  montelukast (SINGULAIR) 10 MG tablet Take 1 tablet (10 mg total) by mouth at bedtime. 10/01/20 10/31/20  ShahmehdiValeria Batman, MD  pantoprazole (PROTONIX) 40 MG tablet Take 1 tablet (40 mg total) by mouth daily. Take 30-60 min before first meal of  the day 02/09/20   Tanda Rockers, MD  pregabalin (LYRICA) 150 MG capsule Take 1 capsule (150 mg total) by mouth 2 (two) times daily. 07/11/21   Kirsteins, Luanna Salk, MD  promethazine (PHENERGAN) 25 MG tablet Take 1 tablet (25 mg total) by mouth every 6 (six) hours as needed for nausea or vomiting. 10/17/20   Estill Dooms, NP  QUEtiapine (SEROQUEL) 25 MG tablet Take 25 mg by mouth 2 (two) times daily.  09/05/19   [provider]  SUMAtriptan (IMITREX) 50 MG tablet TAKE 1 TABLET EVERY 2 HOURS AS NEEDED FOR MIGRAINE OR HEADACHE. 04/18/21   [name], MD  triamcinolone (KENALOG) 0.025 % ointment APPLY TO AFFECTED AREA TWICE A DAY 12/26/20   [name], MD  triamcinolone ointment (KENALOG) 0.5 % Apply 1 application topically as needed. 08/22/20   [name], MD      Allergies    Ferumoxytol, Phenytoin, Pecan nut (diagnostic), and Phenylbutazones    Review of Systems   Review of Systems  Constitutional:  Negative for fever.  Respiratory:  Positive for chest tightness and shortness of breath. Negative for cough.   Cardiovascular:  Negative for chest pain and leg swelling.  Gastrointestinal:  Negative for vomiting.   Physical Exam Updated Vital Signs BP (!) 141/78    Pulse 90    Temp 98.4 F (36.9 C)    Resp 19    Ht 5' 4" (1.626 m)    Wt 125 kg    SpO2 100%    BMI 47.30 kg/m  Physical Exam Vitals and nursing note reviewed.  Constitutional:      Appearance: She is well-developed. She is obese. She is not diaphoretic.     Comments: Currently on a breathing treatment  HENT:     Head: Normocephalic and atraumatic.  Cardiovascular:     Rate and Rhythm: Normal rate and regular rhythm.     Heart sounds: Normal heart sounds.  Pulmonary:     Effort: Pulmonary effort is normal. Tachypnea present. No accessory muscle usage.     Breath sounds: Wheezing (diffuse, expiratory) present.  Abdominal:     General: There is no distension.  Musculoskeletal:     Right lower leg: No  edema.     Left lower leg: No edema.  Skin:    General: Skin is warm and dry.  Neurological:     Mental Status: She is alert.    ED Results / Procedures / Treatments   Labs (all labs ordered are listed, but only abnormal results are displayed) Labs Reviewed  COMPREHENSIVE METABOLIC PANEL - Abnormal; Notable for the following components:      Result Value   CO2 21 (*)    Glucose, Bld 127 (*)    Calcium 8.8 (*)    Total Bilirubin 0.2 (*)    All other components  within normal limits  CBC WITH DIFFERENTIAL/PLATELET - Abnormal; Notable for the following components:   RDW 19.0 (*)    Eosinophils Absolute 0.6 (*)    All other components within normal limits  RESP PANEL BY RT-PCR (FLU A&B, COVID) ARPGX2  HCG, SERUM, QUALITATIVE    EKG EKG Interpretation  Date/Time:  Wednesday July 17 2021 06:50:51 EST Ventricular Rate:  90 PR Interval:  150 QRS Duration: 88 QT Interval:  378 QTC Calculation: 463 R Axis:   43 Text Interpretation: Sinus rhythm Baseline wander in lead(s) V1 Confirmed by Veryl Speak [phone]) on 07/17/2021 6:54:37 AM  Radiology DG Chest 1V REPEAT Same Day  Result Date: 07/17/2021 CLINICAL DATA:  Follow-up possible foreign body. EXAM: CHEST - 1 VIEW SAME DAY COMPARISON:  Chest x-ray from same day. FINDINGS: Previously seen radiopaque density in the superior mediastinum is no longer identified. Unchanged mild cardiomegaly. Normal pulmonary vascularity. No focal consolidation, pleural effusion, or pneumothorax. No acute osseous abnormality. IMPRESSION: 1. Previously seen radiopaque density in the superior mediastinum is no longer identified, consistent with external location. 2. No active disease. Electronically Signed   By: Titus Dubin M.D.   On: 07/17/2021 08:11   DG Chest Portable 1 View  Result Date: 07/17/2021 CLINICAL DATA:  Dizziness scratch set dyspnea.  Shortness of breath. EXAM: PORTABLE CHEST 1 VIEW COMPARISON:  09/27/20 FINDINGS: Indeterminate  radiodensity in the projection of the superior mediastinum measures 1 cm in length. The exact location cannot be determined based on single view of the chest. Stable cardiac enlargement. No pleural effusion or edema. No airspace opacities identified. Visualized osseous structures appear unremarkable. IMPRESSION: 1. Stable cardiac enlargement. 2. Indeterminate radiodensity in the projection of the superior mediastinum. Although this may be external to the patient, this cannot be confirmed on single frontal projection of the chest. Correlation with physical exam findings. If there is a clinical concern for ingested or aspirated foreign body lateral chest radiograph may be helpful to determine exact location of this radiodensity. Electronically Signed   By: Kerby Moors M.D.   On: 07/17/2021 07:39    Procedures .Critical Care Performed by: Sherwood Gambler, MD Authorized by: Sherwood Gambler, MD   Critical care provider statement:    Critical care time (minutes):  40   Critical care time was exclusive of:  Separately billable procedures and treating other patients   Critical care was necessary to treat or prevent imminent or life-threatening deterioration of the following conditions:  Respiratory failure   Critical care was time spent personally by me on the following activities:  Development of treatment plan with patient or surrogate, discussions with consultants, evaluation of patient's response to treatment, examination of patient, ordering and review of laboratory studies, ordering and review of radiographic studies, ordering and performing treatments and interventions, pulse oximetry, re-evaluation of patient's condition and review of old charts    Medications Ordered in ED Medications  albuterol (PROVENTIL,VENTOLIN) solution continuous neb (15 mg/hr Nebulization Not Given 07/17/21 0812)  ipratropium (ATROVENT) 0.02 % nebulizer solution (0.5 mg  Given 07/17/21 0653)  albuterol (PROVENTIL) (2.5  MG/3ML) 0.083% nebulizer solution (10 mg  Given 07/17/21 0653)  methylPREDNISolone sodium succinate (SOLU-MEDROL) 125 mg/2 mL injection 125 mg (125 mg Intravenous Given 07/17/21 0741)  magnesium sulfate IVPB 2 g 50 mL (0 g Intravenous Stopped 07/17/21 0837)  ipratropium (ATROVENT) nebulizer solution 1 mg (1 mg Nebulization Given 07/17/21 0812)  albuterol (PROVENTIL) (2.5 MG/3ML) 0.083% nebulizer solution (15 mg  Given 07/17/21 5465)    ED  Course/ Medical Decision Making/ A&P Clinical Course as of 07/17/21 1013  Wed Jul 17, 2021  0737 Patient is still having a lot of expiratory wheezing despite finishing the albuterol neb started by respiratory.  We will give a continuous albuterol with some Atrovent and reassess. [SG]  1448 Chest xray images have been reviewed by myself.  There is no obvious pneumonia on my read.  However there is an apparent foreign body.  No foreign bodies to the anterior chest but posteriorly she has different metal clips in her hair and I wonder if 1 of these was captured on the x-ray.  We will repeat with her hair above her head [SG]    Clinical Course User Index [SG] Sherwood Gambler, MD                           Medical Decision Making Amount and/or Complexity of Data Reviewed Labs: ordered. Radiology: ordered.  Risk Prescription drug management. Decision regarding hospitalization.   Patient presents with recurrent asthma exacerbation. She is improving with albuterol, though still wheezing and short of breath. Due to this, we will admit. I discussed with Dr. Wynetta Emery for admission. Labs reviewed/interpreted, has normal white blood cell count and hemoglobin and COVID/flu testing is negative.  Metabolic panel unremarkable.  Chest x-ray images reviewed as above, repeat x-ray images show no foreign body and I suspect it was related to her hair clips.  Otherwise, she was given continuous albuterol, Solu-Medrol, and magnesium.  She has improved but will need further treatment in  the hospital.  Low suspicion for occult bacterial pneumonia, PE, ACS, etc.  Admit.        Final Clinical Impression(s) / ED Diagnoses Final diagnoses:  Severe persistent asthma with exacerbation    Rx / DC Orders ED Discharge Orders     None         Sherwood Gambler, MD 07/17/21 1024

## 2021-07-17 NOTE — Assessment & Plan Note (Signed)
-   resume home keppra 500 mg BID

## 2021-07-18 ENCOUNTER — Encounter: Payer: Self-pay | Admitting: Pulmonary Disease

## 2021-07-18 DIAGNOSIS — Z87898 Personal history of other specified conditions: Secondary | ICD-10-CM

## 2021-07-18 DIAGNOSIS — Z79899 Other long term (current) drug therapy: Secondary | ICD-10-CM

## 2021-07-18 DIAGNOSIS — R058 Other specified cough: Secondary | ICD-10-CM

## 2021-07-18 DIAGNOSIS — J4551 Severe persistent asthma with (acute) exacerbation: Secondary | ICD-10-CM

## 2021-07-18 DIAGNOSIS — Z9989 Dependence on other enabling machines and devices: Secondary | ICD-10-CM

## 2021-07-18 DIAGNOSIS — G4733 Obstructive sleep apnea (adult) (pediatric): Secondary | ICD-10-CM

## 2021-07-18 DIAGNOSIS — K219 Gastro-esophageal reflux disease without esophagitis: Secondary | ICD-10-CM

## 2021-07-18 DIAGNOSIS — Z6841 Body Mass Index (BMI) 40.0 and over, adult: Secondary | ICD-10-CM

## 2021-07-18 DIAGNOSIS — I10 Essential (primary) hypertension: Secondary | ICD-10-CM

## 2021-07-18 DIAGNOSIS — M059 Rheumatoid arthritis with rheumatoid factor, unspecified: Secondary | ICD-10-CM

## 2021-07-18 LAB — MRSA NEXT GEN BY PCR, NASAL: MRSA by PCR Next Gen: DETECTED — AB

## 2021-07-18 LAB — BASIC METABOLIC PANEL
Anion gap: 10 (ref 5–15)
BUN: 10 mg/dL (ref 6–20)
CO2: 19 mmol/L — ABNORMAL LOW (ref 22–32)
Calcium: 9 mg/dL (ref 8.9–10.3)
Chloride: 108 mmol/L (ref 98–111)
Creatinine, Ser: 0.64 mg/dL (ref 0.44–1.00)
GFR, Estimated: >60 mL/min (ref >60)
Glucose, Bld: 196 mg/dL — ABNORMAL HIGH (ref 70–99)
Potassium: 4.3 mmol/L (ref 3.5–5.1)
Sodium: 137 mmol/L (ref 135–145)

## 2021-07-18 LAB — BRAIN NATRIURETIC PEPTIDE: B Natriuretic Peptide: 63 pg/mL (ref 0.0–100.0)

## 2021-07-18 LAB — GLUCOSE, CAPILLARY
Glucose-Capillary: 162 mg/dL — ABNORMAL HIGH (ref 70–99)
Glucose-Capillary: 220 mg/dL — ABNORMAL HIGH (ref 70–99)
Glucose-Capillary: 227 mg/dL — ABNORMAL HIGH (ref 70–99)

## 2021-07-18 LAB — PROCALCITONIN: Procalcitonin: <0.1 ng/mL

## 2021-07-18 LAB — MAGNESIUM: Magnesium: 2 mg/dL (ref 1.7–2.4)

## 2021-07-18 MED ORDER — AMLODIPINE BESYLATE 5 MG PO TABS
5.0000 mg | ORAL_TABLET | Freq: Every day | ORAL | Status: DC
  Administered 2021-07-18 – 2021-07-20 (×3): 5 mg via ORAL
  Filled 2021-07-18 (×3): qty 1

## 2021-07-18 MED ORDER — MUPIROCIN 2 % EX OINT
1.0000 "application " | TOPICAL_OINTMENT | Freq: Two times a day (BID) | CUTANEOUS | Status: AC
  Administered 2021-07-18 – 2021-07-22 (×10): 1 via NASAL
  Filled 2021-07-18 (×3): qty 22

## 2021-07-18 MED ORDER — MAGNESIUM SULFATE 2 GM/50ML IV SOLN
2.0000 g | Freq: Once | INTRAVENOUS | Status: AC
  Administered 2021-07-18: 2 g via INTRAVENOUS
  Filled 2021-07-18: qty 50

## 2021-07-18 MED ORDER — ALBUTEROL SULFATE (2.5 MG/3ML) 0.083% IN NEBU
10.0000 mg/h | INHALATION_SOLUTION | RESPIRATORY_TRACT | Status: DC
  Administered 2021-07-18: 10 mg/h via RESPIRATORY_TRACT
  Filled 2021-07-18: qty 12

## 2021-07-18 MED ORDER — INSULIN GLARGINE-YFGN 100 UNIT/ML ~~LOC~~ SOLN
24.0000 [IU] | Freq: Every day | SUBCUTANEOUS | Status: DC
  Administered 2021-07-18: 24 [IU] via SUBCUTANEOUS
  Filled 2021-07-18 (×3): qty 0.24

## 2021-07-18 MED ORDER — INSULIN ASPART 100 UNIT/ML IJ SOLN
10.0000 [IU] | Freq: Three times a day (TID) | INTRAMUSCULAR | Status: DC
  Administered 2021-07-18 (×2): 10 [IU] via SUBCUTANEOUS

## 2021-07-18 MED ORDER — PANTOPRAZOLE SODIUM 40 MG PO TBEC
40.0000 mg | DELAYED_RELEASE_TABLET | Freq: Two times a day (BID) | ORAL | Status: DC
  Administered 2021-07-18 – 2021-07-20 (×4): 40 mg via ORAL
  Filled 2021-07-18 (×4): qty 1

## 2021-07-18 NOTE — Progress Notes (Signed)
Critical result from lab MRSA PCR detected. Dr. Wynetta Emery notified. Standing positive orders placed.

## 2021-07-18 NOTE — Progress Notes (Signed)
°  Transition of Care [facility]) Screening Note   Patient Details  Name: Elizabeth Bautista Date of Birth: 1975-10-19   Transition of Care [facility]) CM/SW Contact:    Boneta Lucks, RN Phone Number: 07/18/2021, 1:01 PM    Transition of Care Department [facility]) has reviewed patient and no TOC needs have been identified at this time. We will continue to monitor patient advancement through interdisciplinary progression rounds. If new patient transition needs arise, please place a TOC consult.

## 2021-07-18 NOTE — Assessment & Plan Note (Signed)
Pulmonary consultation requested.

## 2021-07-18 NOTE — Progress Notes (Signed)
PROGRESS NOTE   Elizabeth Bautista  [id number] DOB: 1975-11-21 DOA: 07/17/2021 PCP: [name], MD   Chief Complaint  Patient presents with   Asthma   Level of care: Stepdown  Brief Admission History:  46 year old female with long history of severe persistent allergic asthma with history of multiple hospitalizations for asthma exacerbation in addition to history of intubation from asthma most recently discharged from the hospital in April 2022 for asthma exacerbation.  She presented to the emergency department with 3 days of progressive wheezing dyspnea and lightheadedness.  She has been seen by Dr. Halford Chessman in pulmonology who reported that breztri was not effective for her and yupelri was too expensive. She had been placed on a trial of Trelegy 200 one puff daily at her last office visit.  She said that she had been doing fairly well on trelegy until the weather started warming up and pollen counts began to rise.  She is having a persistent cough and chest congestion and loud audible wheezing with associated dyspnea on exertion.  She denies fever and chills.  She has OSA and is compliant with nightly CPAP.  She also has rheumatoid arthritis and has been on Plaquenil in the past and followed by Dr. [name] with [facility] rheumatology.  She also has morbid obesity and has had plans [facility].    Pt was placed on continuous albuterol bronchodilator therapy in ED on arrival.  She had some improvement but not back to baseline and with her history of rapid decompensation it was thought best practice that she be monitored in hospital under observation.  Her CXR did not show any findings of pneumonia.  Her sars 2 coronavirus testing was negative.    07/18/2021 : reports not feeling any better, still with excess wheezing, pulmonary consult requested. Dr. Melvyn Novas will see her today.    Assessment and Plan: * Severe asthma with exacerbation- (present on admission) -  recurrent severe asthma exacerbation - she is followed by [facility] - Dr Halford Chessman Maretta Bees noted to be too expensive for outpatient use.  - At last OV was placed on trial of trelegy which she reports is helping.  - continue IV steroids, bronchodilators and respiratory support.  - remains full code    Severe asthma with acute exacerbation- (present on admission) Pulmonary consultation requested.   High risk medication use - monitoring closely with daily labs...  History of seizures - resume home keppra 500 mg BID   Anxiety- (present on admission) - Resume home behavioral health medications.   GERD (gastroesophageal reflux disease)- (present on admission) - protonix ordered for GI protection   Type 2 diabetes mellitus with hyperglycemia, without long-term current use of insulin ([place])- (present on admission) - anticipating steroid induced hyperglycemia - ordered SSI coverage and frequent CBG monitoring - increased prandial novolog to 8 units TIDAC, resistant SSI, add semglee 24 units  Vitamin D deficiency- (present on admission) - restarted daily vitamin D supplementation   Essential hypertension- (present on admission) - resume home HCTZ therapy/add amlodipine 5 mg  Class 3 severe obesity with serious comorbidity and body mass index (BMI) of 45.0 to 49.9 in adult [facility]) - Pt had plans to follow up with [facility].   Seropositive rheumatoid arthritis ([place])- (present on admission) - She is followed by an outpatient rheumatologist - Resume home plaquenil therapy   OSA on CPAP -continue nightly CPAP therapy - she has been compliant using in hospital  DVT prophylaxis: enoxaparin Code Status: full  Family Communication: bedside update 2/16 Disposition: Status is: Inpatient Remains inpatient appropriate because: IV steroids required    Consultants:  Pulmonologist  Procedures:   Antimicrobials:    Subjective: Pt reports no improvement since  admission.    Objective: Vitals:   07/18/21 1100 07/18/21 1158 07/18/21 1200 07/18/21 1202  BP:    (!) 146/68  Pulse:  98 90 98  Resp: (!) 25 (!) 23 (!) 23 (!) 21  Temp:  97.9 F (36.6 C)    TempSrc:  Axillary    SpO2:  96% 96% 98%  Weight:      Height:        Intake/Output Summary (Last 24 hours) at 07/18/2021 1242 Last data filed at 07/18/2021 0600 Gross per 24 hour  Intake 420 ml  Output --  Net 420 ml   Filed Weights   07/17/21 0644 07/17/21 1749 07/18/21 0600  Weight: 125 kg 125.6 kg 125.4 kg   Examination:  General exam: Appears calm and comfortable  Respiratory system: diffuse inspiratory and expiratory wheezing, moderated increased work of breathing. Cardiovascular system: normal S1 & S2 heard. Tachycardic rate.  No JVD, murmurs, rubs, gallops or clicks. No pedal edema. Gastrointestinal system: Abdomen is nondistended, soft and nontender. No organomegaly or masses felt. Normal bowel sounds heard. Central nervous system: Alert and oriented. No focal neurological deficits. Extremities: Symmetric 5 x 5 power. Skin: No rashes, lesions or ulcers. Psychiatry: Judgement and insight appear normal. Mood & affect appropriate.   Data Reviewed: I have personally reviewed following labs and imaging studies  CBC: Recent Labs  Lab 07/17/21 0712  WBC 6.9  NEUTROABS 4.3  HGB 12.6  HCT 40.1  MCV 83.9  PLT 214    Basic Metabolic Panel: Recent Labs  Lab 07/17/21 0712 07/18/21 0346  NA 141 137  K 3.7 4.3  CL 108 108  CO2 21* 19*  GLUCOSE 127* 196*  BUN 8 10  CREATININE 0.61 0.64  CALCIUM 8.8* 9.0  MG  --  2.0    CBG: Recent Labs  Lab 07/17/21 1155 07/17/21 1815 07/17/21 2204 07/18/21 0810 07/18/21 1156  GLUCAP 217* 147* 226* 162* 220*    Recent Results (from the past 240 hour(s))  Resp Panel by RT-PCR (Flu A&B, Covid) Nasopharyngeal Swab     Status: None   Collection Time: 07/17/21  7:12 AM   Specimen: Nasopharyngeal Swab; Nasopharyngeal(NP) swabs in  vial transport medium  Result Value Ref Range Status   SARS Coronavirus 2 by RT PCR NEGATIVE NEGATIVE Final    Comment: (NOTE) SARS-CoV-2 target nucleic acids are NOT DETECTED.  The SARS-CoV-2 RNA is generally detectable in upper respiratory specimens during the acute phase of infection. The lowest concentration of SARS-CoV-2 viral copies this assay can detect is 138 copies/mL. A negative result does not preclude SARS-Cov-2 infection and should not be used as the sole basis for treatment or other patient management decisions. A negative result may occur with  improper specimen collection/handling, submission of specimen other than nasopharyngeal swab, presence of viral mutation(s) within the areas targeted by this assay, and inadequate number of viral copies(<138 copies/mL). A negative result must be combined with clinical observations, patient history, and epidemiological information. The expected result is Negative.  Fact Sheet for Patients:  BloggerCourse.com  Fact Sheet for Healthcare Providers:  SeriousBroker.it  This test is no t yet approved or cleared by the [place] FDA and  has been authorized for detection and/or diagnosis of  SARS-CoV-2 by FDA under an Emergency Use Authorization (EUA). This EUA will remain  in effect (meaning this test can be used) for the duration of the COVID-19 declaration under Section 564(b)(1) of the Act, 21 U.S.C.section 360bbb-3(b)(1), unless the authorization is terminated  or revoked sooner.       Influenza A by PCR NEGATIVE NEGATIVE Final   Influenza B by PCR NEGATIVE NEGATIVE Final    Comment: (NOTE) The Xpert Xpress SARS-CoV-2/FLU/RSV plus assay is intended as an aid in the diagnosis of influenza from Nasopharyngeal swab specimens and should not be used as a sole basis for treatment. Nasal washings and aspirates are unacceptable for Xpert Xpress SARS-CoV-2/FLU/RSV testing.  Fact  Sheet for Patients: EntrepreneurPulse.com.au  Fact Sheet for Healthcare Providers: IncredibleEmployment.be  This test is not yet approved or cleared by the [place] FDA and has been authorized for detection and/or diagnosis of SARS-CoV-2 by FDA under an Emergency Use Authorization (EUA). This EUA will remain in effect (meaning this test can be used) for the duration of the COVID-19 declaration under Section 564(b)(1) of the Act, 21 U.S.C. section 360bbb-3(b)(1), unless the authorization is terminated or revoked.  Performed at [facility], [address], [place], [place] [address]   MRSA Next Gen by PCR, Nasal     Status: Abnormal   Collection Time: 07/17/21  5:55 PM   Specimen: Nasal Mucosa; Nasal Swab  Result Value Ref Range Status   MRSA by PCR Next Gen DETECTED (A) NOT DETECTED Final    Comment: RESULT CALLED TO, READ BACK BY AND VERIFIED WITH: Shaune Spittle RN [phone] K FORSYTH (NOTE) The GeneXpert MRSA Assay (FDA approved for NASAL specimens only), is one component of a comprehensive MRSA colonization surveillance program. It is not intended to diagnose MRSA infection nor to guide or monitor treatment for MRSA infections. Test performance is not FDA approved in patients less than 73 years old. Performed at [facility], [address], [place], [place] [address]      Radiology Studies: DG Chest 1V REPEAT Same Day  Result Date: 07/17/2021 CLINICAL DATA:  Follow-up possible foreign body. EXAM: CHEST - 1 VIEW SAME DAY COMPARISON:  Chest x-ray from same day. FINDINGS: Previously seen radiopaque density in the superior mediastinum is no longer identified. Unchanged mild cardiomegaly. Normal pulmonary vascularity. No focal consolidation, pleural effusion, or pneumothorax. No acute osseous abnormality. IMPRESSION: 1. Previously seen radiopaque density in the superior mediastinum is no longer identified, consistent with external location. 2. No  active disease. Electronically Signed   By: Titus Dubin M.D.   On: 07/17/2021 08:11   DG Chest Portable 1 View  Result Date: 07/17/2021 CLINICAL DATA:  Dizziness scratch set dyspnea.  Shortness of breath. EXAM: PORTABLE CHEST 1 VIEW COMPARISON:  09/27/20 FINDINGS: Indeterminate radiodensity in the projection of the superior mediastinum measures 1 cm in length. The exact location cannot be determined based on single view of the chest. Stable cardiac enlargement. No pleural effusion or edema. No airspace opacities identified. Visualized osseous structures appear unremarkable. IMPRESSION: 1. Stable cardiac enlargement. 2. Indeterminate radiodensity in the projection of the superior mediastinum. Although this may be external to the patient, this cannot be confirmed on single frontal projection of the chest. Correlation with physical exam findings. If there is a clinical concern for ingested or aspirated foreign body lateral chest radiograph may be helpful to determine exact location of this radiodensity. Electronically Signed   By: Kerby Moors M.D.   On: 07/17/2021 07:39  Scheduled Meds:  albuterol  2.5 mg Nebulization Q4H   albuterol  15 mg/hr Nebulization Once   amLODipine  5 mg Oral Daily   arformoterol  15 mcg Nebulization BID   budesonide (PULMICORT) nebulizer solution  0.25 mg Nebulization BID   busPIRone  7.5 mg Oral BID   Chlorhexidine Gluconate Cloth  6 each Topical Daily   cholecalciferol  5,000 Units Oral Daily   DULoxetine  60 mg Oral Daily   enoxaparin (LOVENOX) injection  60 mg Subcutaneous Daily   fluticasone  2 spray Each Nare Daily   hydrochlorothiazide  25 mg Oral Daily   hydroxychloroquine  200 mg Oral BID   insulin aspart  0-20 Units Subcutaneous TID WC   insulin aspart  0-5 Units Subcutaneous QHS   insulin aspart  10 Units Subcutaneous TID WC   insulin glargine-yfgn  24 Units Subcutaneous Daily   levETIRAcetam  500 mg Oral BID   loratadine  10 mg Oral Daily    methylPREDNISolone (SOLU-MEDROL) injection  60 mg Intravenous Q12H   montelukast  10 mg Oral QHS   mupirocin ointment  1 application Nasal BID   pantoprazole  40 mg Oral BID   pregabalin  100 mg Oral BID   QUEtiapine  25 mg Oral BID   revefenacin  175 mcg Nebulization Daily   Continuous Infusions:   LOS: 1 day   Critical Care Procedure Note Authorized and Performed by: Murvin Natal MD  Total Critical Care time:  55 mins Due to a high probability of clinically significant, life threatening deterioration, the patient required my highest level of preparedness to intervene emergently and I personally spent this critical care time directly and personally managing the patient.  This critical care time included obtaining a history; examining the patient, pulse oximetry; ordering and review of studies; arranging urgent treatment with development of a management plan; evaluation of patient's response of treatment; frequent reassessment; and discussions with other providers.  This critical care time was performed to assess and manage the high probability of imminent and life threatening deterioration that could result in multi-organ failure.  It was exclusive of separately billable procedures and treating other patients and teaching time.   Irwin Brakeman, MD How to contact the [facility] Attending or Consulting provider [employer] or covering provider during after hours [place], for this patient?  Check the care team in [facility] and look for a) attending/consulting [facility] provider listed and b) the [facility] team listed Log into [email or web address] and use [facility]'s universal password to access. If you do not have the password, please contact the hospital operator. Locate the [facility] provider you are looking for under [facility] and page to a number that you can be directly reached. If you still have difficulty reaching the provider, please page the [facility] (Director on Call) for the Hospitalists listed on amion for  assistance.  07/18/2021, 12:42 PM

## 2021-07-18 NOTE — Consult Note (Addendum)
NAMEAshlea Bautista, MRN:  [id number], DOB:  Dec 10, 1975, LOS: 1 ADMISSION DATE:  07/17/2021, CONSULTATION DATE:  07/17/21  REFERRING MD:  Triad, CHIEF COMPLAINT:  status asthmaticus vs VCD   History of Present Illness:  45 yobf  Quit smoking 06/2014  with allergies as child and new onset "asthma" during difficult delivery 03-04-99 which resulted in death of her baby and "never right since' despite quit smoking in 2016 much worse since summer 2020 req freq prednisone rx and now back in [facility] with severe hoaseness / refractory wheeze abupt onset 07/12/21 while maint on trelegy and PCCM service asked to consult am 2/16     Pertinent  Medical History  Anemia Asthma Diabetes  Eczema Edema  HTN Rheumatoid arthritis Seizures   Sleep apnea   Significant Hospital Events: Including procedures, antibiotic start and stop dates in addition to other pertinent events      Scheduled Meds:  albuterol  2.5 mg Nebulization Q4H   albuterol  15 mg/hr Nebulization Once   amLODipine  5 mg Oral Daily   arformoterol  15 mcg Nebulization BID   budesonide (PULMICORT) nebulizer solution  0.25 mg Nebulization BID   busPIRone  7.5 mg Oral BID   Chlorhexidine Gluconate Cloth  6 each Topical Daily   cholecalciferol  5,000 Units Oral Daily   DULoxetine  60 mg Oral Daily   enoxaparin (LOVENOX) injection  60 mg Subcutaneous Daily   fluticasone  2 spray Each Nare Daily   hydrochlorothiazide  25 mg Oral Daily   hydroxychloroquine  200 mg Oral BID   insulin aspart  0-20 Units Subcutaneous TID WC   insulin aspart  0-5 Units Subcutaneous QHS   insulin aspart  10 Units Subcutaneous TID WC   insulin glargine-yfgn  24 Units Subcutaneous Daily   levETIRAcetam  500 mg Oral BID   loratadine  10 mg Oral Daily   methylPREDNISolone (SOLU-MEDROL) injection  60 mg Intravenous Q12H   montelukast  10 mg Oral QHS   mupirocin ointment  1 application Nasal BID   pantoprazole  40 mg Oral BID   pregabalin  100 mg Oral BID    QUEtiapine  25 mg Oral BID   revefenacin  175 mcg Nebulization Daily   Continuous Infusions: PRN Meds:.acetaminophen **OR** acetaminophen, bisacodyl, cyclobenzaprine, ondansetron **OR** ondansetron (ZOFRAN) IV, oxyCODONE    Interim History / Subjective:  Minimally improved on bipap / very difficult to understand her speech   Objective   Blood pressure (!) 146/68, pulse 98, temperature 97.9 F (36.6 C), temperature source Axillary, resp. rate (!) 21, height 5' 4"  (1.626 m), weight 125.4 kg, SpO2 98 %.        Intake/Output Summary (Last 24 hours) at 07/18/2021 1303 Last data filed at 07/18/2021 0600 Gross per 24 hour  Intake 420 ml  Output --  Net 420 ml   Filed Weights   07/17/21 0644 07/17/21 1749 07/18/21 0600  Weight: 125 kg 125.6 kg 125.4 kg    Examination: General: obese hoarse bf with wheezing heard across the room  Tmax 98 HENT: cpap in place ok on RA  Lungs: distant wheeze mostly transmitted from upper airway  Cardiovascular: RRR no s3 Abdomen: obese/benign Extremities: warm/ no edema Neuro: intact     I personally reviewed images and agree with radiology impression as follows:  CXR:   07/18/21 Mild CM/ no air trapping       Assessment & Plan:  1) 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   2) cpap dep for now, on CPAP at home per SOOD and usually well compensated - could change to bipap if needed     3) low HC03 most likely from acute on chronic hyperventilation syndrome. Labs   CBC: Recent Labs  Lab  07/17/21 0712  WBC 6.9  NEUTROABS 4.3  HGB 12.6  HCT 40.1  MCV 83.9  PLT 735    Basic Metabolic Panel: Recent Labs  Lab 07/17/21 0712 07/18/21 0346  NA 141 137  K 3.7 4.3  CL 108 108  CO2 21* 19*  GLUCOSE 127* 196*  BUN 8 10  CREATININE 0.61 0.64  CALCIUM 8.8* 9.0  MG  --  2.0   GFR: Estimated Creatinine Clearance: 113.9 mL/min (by C-G formula based on SCr of 0.64 mg/dL). Recent Labs  Lab 07/17/21 0712 07/18/21 0346  PROCALCITON <0.10 <0.10  WBC 6.9  --     Liver Function Tests: Recent Labs  Lab 07/17/21 0712  AST 35  ALT 38  ALKPHOS 101  BILITOT 0.2*  PROT 7.0  ALBUMIN 3.6   No results for input(s): LIPASE, AMYLASE in the last 168 hours. No results for input(s): AMMONIA in the last 168 hours.  ABG    Component Value Date/Time   PHART 7.336 (L) 10/18/2019 2100   PCO2ART 28.7 (L) 10/18/2019 2100   PO2ART 165 (H) 10/18/2019 2100   HCO3 17.1 (L) 10/18/2019 2100   ACIDBASEDEF 9.8 (H) 10/18/2019 2100   O2SAT 99.1 10/18/2019 2100     Coagulation Profile: No results for input(s): INR, PROTIME in the last 168 hours.  Cardiac Enzymes: No results for input(s): CKTOTAL, CKMB, CKMBINDEX, TROPONINI in the last 168 hours.  HbA1C: Hemoglobin A1C  Date/Time Value Ref Range Status  04/15/2021 12:00 AM 7.1  Final   Hgb A1c MFr Bld  Date/Time Value Ref Range Status  07/17/2021 07:12 AM 5.8 (H) 4.8 - 5.6 % Final    Comment:    (NOTE) Pre diabetes:          5.7%-6.4%  Diabetes:              >6.4%  Glycemic control for   <7.0% adults with diabetes   09/27/2020 12:42 PM 7.0 (H) 4.8 - 5.6 % Final    Comment:    (NOTE) Pre diabetes:          5.7%-6.4%  Diabetes:              >6.4%  Glycemic control for   <7.0% adults with diabetes     CBG: Recent Labs  Lab 07/17/21 1155 07/17/21 1815 07/17/21 2204 07/18/21 0810 07/18/21 1156  GLUCAP 217* 147* 226* 162* 220*       Past Medical History:  She,  has a past medical history of Anemia,  Asthma, COVID-19 virus infection (05/16/2020), Diabetes ([place]), Eczema, Edema, Heart problem, HTN (hypertension), Rheumatoid arthritis ([place]), Seizures ([place]), Sleep apnea, and Sleep apnea.   Surgical History:   Past  Surgical History:  Procedure Laterality Date   APPENDECTOMY     CESAREAN SECTION     x3     Social History:   reports that she quit smoking about 8 years ago. Her smoking use included cigarettes. She has never used smokeless tobacco. She reports current alcohol use. She reports that she does not currently use drugs.   Family History:  Her family history includes Angioedema in her daughter; Asthma in her daughter, maternal aunt, and son; Breast cancer in her cousin and cousin; Cervical cancer in her maternal aunt; Dementia in her maternal aunt; Diabetes in her brother, brother, father, paternal aunt, paternal grandmother, sister, and another family member; Heart Problems in her maternal grandmother and sister; Heart attack in an other family member; High blood pressure in her mother and another family member; Rheum arthritis in her mother; Seizures in her cousin, cousin, and cousin.   Allergies Allergies  Allergen Reactions   Ferumoxytol Anaphylaxis    ~5 years ago. Tolerated IV venofer   Phenytoin Anaphylaxis    Other reaction(s): swelling and heart rate decreased   Pecan Nut (Diagnostic) Other (See Comments)   Phenylbutazones Other (See Comments)     Home Medications  Prior to Admission medications   Medication Sig Start Date End Date Taking? Authorizing Provider  albuterol (PROAIR HFA) 108 (90 Base) MCG/ACT inhaler 2 puffs every 4 hours as needed only  if your can't catch your breath Patient taking differently: Inhale 1-2 puffs into the lungs every 4 (four) hours as needed for wheezing or shortness of breath (only if you can't catch your breath). 08/22/20  Yes [name], MD  albuterol (PROVENTIL) (2.5 MG/3ML) 0.083% nebulizer solution Take 3 mLs (2.5 mg total) by  nebulization every 6 (six) hours as needed for wheezing or shortness of breath. 09/18/20  Yes [name], MD  blood glucose meter kit and supplies Dispense based on patient and insurance preference. Use up to four times daily as directed. (FOR ICD-10 E10.9, E11.9). 02/01/20  Yes Ailene Ards, NP  Blood Pressure Monitoring [facility]) MISC 1 each by Does not apply route daily. 01/18/20  Yes Ailene Ards, NP  busPIRone (BUSPAR) 7.5 MG tablet Take 7.5 mg by mouth 2 (two) times daily. 09/05/19  Yes [provider]  cetirizine (ZYRTEC) 10 MG tablet Take 1 tablet (10 mg total) by mouth daily. 06/08/20  Yes Valentina Shaggy, MD  Cholecalciferol 1.25 MG (50000 UT) TABS Take 1 tablet by mouth daily. 02/01/20  Yes Ailene Ards, NP  cyclobenzaprine (FLEXERIL) 10 MG tablet Take 10 mg by mouth 3 (three) times daily as needed for muscle spasms.  08/27/19  Yes [provider]  DULoxetine (CYMBALTA) 60 MG capsule Take 60 mg by mouth daily. 04/17/21  Yes [provider]  EPINEPHrine 0.3 mg/0.3 mL IJ SOAJ injection Inject 0.3 mg into the muscle as needed for anaphylaxis. 09/26/20  Yes Chesley Mires, MD  escitalopram (LEXAPRO) 20 MG tablet Take 1 tablet by mouth every evening. 02/29/20  Yes [provider]  famotidine (PEPCID) 20 MG tablet Take 20 mg by mouth daily. 07/31/19  Yes [provider]  ferrous sulfate 325 (65 FE) MG tablet TAKE 1 TABLET BY MOUTH TWICE A DAY 11/29/20  Yes Patel, Colin Broach, MD  fluticasone (FLONASE) 50 MCG/ACT nasal spray SPRAY 1 SPRAY INTO BOTH NOSTRILS DAILY. 11/14/20  Yes Valentina Shaggy, MD  Fluticasone-Umeclidin-Vilant (TRELEGY ELLIPTA) 200-62.5-25 MCG/INH AEPB Inhale 1 puff into the lungs daily. 02/22/21  Yes Chesley Mires, MD  hydrochlorothiazide (HYDRODIURIL) 25 MG tablet Take 1 tablet (25 mg total) by mouth daily. For high blood pressure. 05/16/21  Yes [name], MD  hydroxychloroquine (PLAQUENIL) 200 MG tablet TAKE 1 TABLET BY  MOUTH TWICE A DAY 07/11/21  Yes Rice, Resa Miner, MD  levETIRAcetam (KEPPRA) 500 MG tablet Take 1 tablet (500 mg total) by mouth 2 (two) times daily. 03/13/20 07/18/21 Yes Shah, Pratik D, DO  metFORMIN (GLUCOPHAGE XR) 500 MG 24 hr tablet Take 1 tablet (500 mg total) by mouth daily with breakfast. 05/16/21  Yes Patel, Colin Broach, MD  montelukast (SINGULAIR) 10 MG tablet Take 1 tablet (10 mg total) by mouth at bedtime. 10/01/20 07/18/21 Yes Shahmehdi, Seyed A, MD  pantoprazole (PROTONIX) 40 MG tablet Take 1 tablet (40 mg total) by mouth daily. Take 30-60 min before first meal of the day 02/09/20  Yes Tanda Rockers, MD  pregabalin (LYRICA) 150 MG capsule Take 1 capsule (150 mg total) by mouth 2 (two) times daily. 07/11/21  Yes Kirsteins, Luanna Salk, MD  promethazine (PHENERGAN) 25 MG tablet Take 1 tablet (25 mg total) by mouth every 6 (six) hours as needed for nausea or vomiting. 10/17/20  Yes Derrek Monaco A, NP  QUEtiapine (SEROQUEL) 100 MG tablet Take 50-100 mg by mouth at bedtime. 06/24/21  Yes [provider]  SUMAtriptan (IMITREX) 50 MG tablet TAKE 1 TABLET EVERY 2 HOURS AS NEEDED FOR MIGRAINE OR HEADACHE. 04/18/21  Yes [name], MD  traZODone (DESYREL) 100 MG tablet Take 100 mg by mouth at bedtime. 06/24/21  Yes [provider]  triamcinolone (KENALOG) 0.025 % ointment APPLY TO AFFECTED AREA TWICE A DAY 12/26/20  Yes [name], MD  amoxicillin-clavulanate (AUGMENTIN) 875-125 MG tablet Take 1 tablet by mouth 2 (two) times daily. Patient not taking: Reported on 07/17/2021 05/30/21   [name], MD  azelastine (ASTELIN) 0.1 % nasal spray Place 1 spray into both nostrils 2 (two) times daily. Use in each nostril as directed Patient not taking: Reported on 07/17/2021 02/22/21   Chesley Mires, MD  triamcinolone ointment (KENALOG) 0.5 % Apply 1 application topically as needed. Patient not taking: Reported on 07/18/2021 08/22/20   [name], MD      The patient is critically  ill with multiple organ systems failure and requires high complexity decision making for assessment and support, frequent evaluation and titration of therapies, application of advanced monitoring technologies and extensive interpretation of multiple databases. Critical Care Time devoted to patient care services described in this note is 45  minutes.   Christinia Gully, MD Pulmonary and [place] [phone]   After 7:00 pm call Elink  [phone]

## 2021-07-19 ENCOUNTER — Other Ambulatory Visit: Payer: Self-pay

## 2021-07-19 DIAGNOSIS — I10 Essential (primary) hypertension: Secondary | ICD-10-CM | POA: Diagnosis not present

## 2021-07-19 DIAGNOSIS — K219 Gastro-esophageal reflux disease without esophagitis: Secondary | ICD-10-CM | POA: Diagnosis not present

## 2021-07-19 DIAGNOSIS — R058 Other specified cough: Secondary | ICD-10-CM | POA: Diagnosis not present

## 2021-07-19 DIAGNOSIS — J4551 Severe persistent asthma with (acute) exacerbation: Secondary | ICD-10-CM | POA: Diagnosis not present

## 2021-07-19 DIAGNOSIS — Z87898 Personal history of other specified conditions: Secondary | ICD-10-CM | POA: Diagnosis not present

## 2021-07-19 DIAGNOSIS — E1165 Type 2 diabetes mellitus with hyperglycemia: Secondary | ICD-10-CM

## 2021-07-19 LAB — GLUCOSE, CAPILLARY
Glucose-Capillary: 103 mg/dL — ABNORMAL HIGH (ref 70–99)
Glucose-Capillary: 164 mg/dL — ABNORMAL HIGH (ref 70–99)
Glucose-Capillary: 121 mg/dL — ABNORMAL HIGH (ref 70–99)
Glucose-Capillary: 127 mg/dL — ABNORMAL HIGH (ref 70–99)

## 2021-07-19 MED ORDER — INSULIN ASPART 100 UNIT/ML IJ SOLN
12.0000 [IU] | Freq: Three times a day (TID) | INTRAMUSCULAR | Status: DC
  Administered 2021-07-19 – 2021-07-20 (×4): 12 [IU] via SUBCUTANEOUS

## 2021-07-19 MED ORDER — INSULIN GLARGINE-YFGN 100 UNIT/ML ~~LOC~~ SOLN
30.0000 [IU] | Freq: Every day | SUBCUTANEOUS | Status: DC
  Administered 2021-07-19 – 2021-07-20 (×2): 30 [IU] via SUBCUTANEOUS
  Filled 2021-07-19 (×3): qty 0.3

## 2021-07-19 MED ORDER — ALBUTEROL SULFATE (2.5 MG/3ML) 0.083% IN NEBU
INHALATION_SOLUTION | RESPIRATORY_TRACT | Status: AC
Start: 2021-07-19 — End: 2021-07-19
  Administered 2021-07-19: 2.5 mg
  Filled 2021-07-19: qty 3

## 2021-07-19 MED ORDER — ALBUTEROL SULFATE (2.5 MG/3ML) 0.083% IN NEBU
2.5000 mg | INHALATION_SOLUTION | RESPIRATORY_TRACT | Status: DC | PRN
  Administered 2021-07-22: 2.5 mg via RESPIRATORY_TRACT
  Filled 2021-07-19: qty 3

## 2021-07-19 NOTE — Progress Notes (Signed)
[name], MRN:  [id number], DOB:  1975/10/28, LOS: 2 ADMISSION DATE:  07/17/2021, CONSULTATION DATE:  07/17/21  REFERRING MD:  Triad, CHIEF COMPLAINT:  status asthmaticus vs VCD   History of Present Illness:  42 yobf  Quit smoking 06/2014  with allergies as child and new onset "asthma" during difficult delivery Mar 06, 1999 which resulted in death of her baby and "never right since' despite quit smoking in 2016 much worse since summer 2020 req freq prednisone rx and now back in [facility] with severe hoaseness / refractory wheeze abupt onset 07/12/21 while maint on trelegy and PCCM service asked to consult am 2/16     Pharmacy review done 2/17 and indicates refilling saba hfa and neb more than Trelegy though she said the opposite and that her breathing was "just fine" up until the day PTA   Pertinent  Medical History  Anemia Asthma Diabetes  Eczema Edema  HTN Rheumatoid arthritis Seizures   Sleep apnea   Significant Hospital Events: Including procedures, antibiotic start and stop dates in addition to other pertinent events      Scheduled Meds:  albuterol  2.5 mg Nebulization Q4H   albuterol  15 mg/hr Nebulization Once   amLODipine  5 mg Oral Daily   arformoterol  15 mcg Nebulization BID   budesonide (PULMICORT) nebulizer solution  0.25 mg Nebulization BID   busPIRone  7.5 mg Oral BID   Chlorhexidine Gluconate Cloth  6 each Topical Daily   cholecalciferol  5,000 Units Oral Daily   DULoxetine  60 mg Oral Daily   enoxaparin (LOVENOX) injection  60 mg Subcutaneous Daily   fluticasone  2 spray Each Nare Daily   hydrochlorothiazide  25 mg Oral Daily   hydroxychloroquine  200 mg Oral BID   insulin aspart  0-20 Units Subcutaneous TID WC   insulin aspart  0-5 Units Subcutaneous QHS   insulin aspart  12 Units Subcutaneous TID WC   insulin glargine-yfgn  30 Units Subcutaneous Daily   levETIRAcetam  500 mg Oral BID   loratadine  10 mg Oral Daily   methylPREDNISolone (SOLU-MEDROL)  injection  60 mg Intravenous Q12H   montelukast  10 mg Oral QHS   mupirocin ointment  1 application Nasal BID   pantoprazole  40 mg Oral BID AC   pregabalin  100 mg Oral BID   QUEtiapine  25 mg Oral BID   revefenacin  175 mcg Nebulization Daily   Continuous Infusions:  albuterol 10 mg/hr (07/18/21 1436)   PRN Meds:.acetaminophen **OR** acetaminophen, bisacodyl, cyclobenzaprine, ondansetron **OR** ondansetron (ZOFRAN) IV, oxyCODONE    Interim History / Subjective:  No longer on cpap/ biap/ still very hoarse with congested cough / unusual affect to point of being reluctant to answer questions directly "you and I both know the answer to that question"   Objective   Blood pressure (!) 146/69, pulse 72, temperature 97.8 F (36.6 C), temperature source Oral, resp. rate 19, height 5\' 4"  (1.626 m), weight 125.4 kg, SpO2 94 %.        Intake/Output Summary (Last 24 hours) at 07/19/2021 1359 Last data filed at 07/19/2021 0600 Gross per 24 hour  Intake 585.65 ml  Output --  Net 585.65 ml   Filed Weights   07/17/21 0644 07/17/21 1749 07/18/21 0600  Weight: 125 kg 125.6 kg 125.4 kg    Examination: Obese bf / congested rattling cough  Tmax 98 Sats 92% on 2lpm NP  HENT: orophx clear Lungs: distant wheeze mostly transmitted from  upper airway  Cardiovascular:   RRR no S3 Abdomen: obese/benign Extremities: warm, no edema  Neuro: sensorium intact, affect somber/ reserved      I personally reviewed images and agree with radiology impression as follows:  CXR:   07/18/21 Mild CM/ no air trapping       Assessment & Plan:  1) 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  Ok to progress to floor and d/c to f/u with Dr Craige CottaSood with all meds in hand using a trust but verify approach to confirm accurate Medication  Reconciliation The principal here is that until we are certain that the  patients are doing what we've asked, it makes no sense to ask them to do more.    Labs   CBC: Recent Labs  Lab 07/17/21 0712  WBC 6.9  NEUTROABS 4.3  HGB 12.6  HCT 40.1  MCV 83.9  PLT 214    Basic Metabolic Panel: Recent Labs  Lab 07/17/21 0712 07/18/21 0346  NA 141 137  K 3.7 4.3  CL 108 108  CO2 21* 19*  GLUCOSE 127* 196*  BUN 8 10  CREATININE 0.61 0.64  CALCIUM 8.8* 9.0  MG  --  2.0   GFR: Estimated Creatinine Clearance: 113.9 mL/min (by C-G formula based on SCr of 0.64 mg/dL). Recent Labs  Lab 07/17/21 0712 07/18/21 0346  PROCALCITON <0.10 <0.10  WBC 6.9  --     Liver Function Tests: Recent Labs  Lab 07/17/21 0712  AST 35  ALT 38  ALKPHOS 101  BILITOT 0.2*  PROT 7.0  ALBUMIN 3.6   No results for input(s): LIPASE, AMYLASE in the last 168 hours. No results for input(s): AMMONIA in the last 168 hours.  ABG    Component Value Date/Time   PHART 7.336 (L) 10/18/2019 2100   PCO2ART 28.7 (L) 10/18/2019 2100   PO2ART 165 (H) 10/18/2019 2100   HCO3 17.1 (L) 10/18/2019 2100   ACIDBASEDEF 9.8 (H) 10/18/2019 2100   O2SAT 99.1 10/18/2019 2100     Coagulation Profile: No results for input(s): INR, PROTIME in the last 168 hours.  Cardiac Enzymes: No results for input(s): CKTOTAL, CKMB, CKMBINDEX, TROPONINI in the last 168 hours.  HbA1C: Hemoglobin A1C  Date/Time Value Ref Range Status  04/15/2021 12:00 AM 7.1  Final   Hgb A1c MFr Bld  Date/Time Value Ref Range Status  07/17/2021 07:12 AM 5.8 (H) 4.8 - 5.6 % Final    Comment:    (NOTE) Pre diabetes:          5.7%-6.4%  Diabetes:               >6.4%  Glycemic control for   <7.0% adults with diabetes   09/27/2020 12:42 PM 7.0 (H) 4.8 - 5.6 % Final    Comment:    (NOTE) Pre diabetes:          5.7%-6.4%  Diabetes:              >  6.4%  Glycemic control for   <7.0% adults with diabetes     CBG: Recent Labs  Lab 07/18/21 0810 07/18/21 1156 07/18/21 2134 07/19/21 0745 07/19/21 1138  GLUCAP 162* 220* 227* 103* 164*     Sandrea Hughs, MD Pulmonary and Critical Care Medicine [facility] [phone]   After 7:00 pm call Elink  [phone]

## 2021-07-19 NOTE — Progress Notes (Signed)
PROGRESS NOTE   Elizabeth Bautista  [id number] DOB: [birth year] DOA: 07/17/2021 PCP: [name], MD   Chief Complaint  Patient presents with   Asthma   Level of care: Telemetry  Brief Admission History:  46 year old female with long history of severe persistent allergic asthma with history of multiple hospitalizations for asthma exacerbation in addition to history of intubation from asthma most recently discharged from the hospital in April 2022 for asthma exacerbation.  She presented to the emergency department with 3 days of progressive wheezing dyspnea and lightheadedness.  She has been seen by Dr. Halford Chessman in pulmonology who reported that breztri was not effective for her and yupelri was too expensive. She had been placed on a trial of Trelegy 200 one puff daily at her last office visit.  She said that she had been doing fairly well on trelegy until the weather started warming up and pollen counts began to rise.  She is having a persistent cough and chest congestion and loud audible wheezing with associated dyspnea on exertion.  She denies fever and chills.  She has OSA and is compliant with nightly CPAP.  She also has rheumatoid arthritis and has been on Plaquenil in the past and followed by Dr. [name] with [facility] rheumatology.  She also has morbid obesity and has [facility]    Pt was placed on continuous albuterol bronchodilator therapy in ED on arrival.  She had some improvement but not back to baseline and with her history of rapid decompensation it was thought best practice that she be monitored in hospital under observation.  Her CXR did not show any findings of pneumonia.  Her sars 2 coronavirus testing was negative.    07/18/2021 : reports not feeling any better, still with excess wheezing, pulmonary consult requested. Dr. Melvyn Novas will see her today.   07/19/2021:  some improvement but not back to baseline.  Ok to transfer out of  stepdown ICU.  Continue current management.     Assessment and Plan: * Severe asthma with exacerbation- (present on admission) - recurrent severe asthma exacerbation - she is followed by [facility] - Dr Halford Chessman Maretta Bees noted to be too expensive for outpatient use.  - At last OV was placed on trial of trelegy which she reports is helping.  - continue IV steroids, bronchodilators and respiratory support.  - remains full code  - Dr. Melvyn Novas consulted and recommended sending home on stiolto and stopping trelegy.    High risk medication use - monitoring closely with daily labs...  History of seizures - resume home keppra 500 mg BID   Anxiety- (present on admission) - Resume home behavioral health medications.   GERD (gastroesophageal reflux disease)- (present on admission) - protonix ordered for GI protection   Type 2 diabetes mellitus with hyperglycemia, without long-term current use of insulin ([place])- (present on admission) - pt having steroid induced hyperglycemia - ordered SSI coverage and frequent CBG monitoring - increased prandial novolog to 12 units TIDAC, resistant SSI, add semglee 30 units CBG (last 3)  Recent Labs    07/19/21 0745 07/19/21 1138 07/19/21 1644  GLUCAP 103* 164* 121*     Vitamin D deficiency- (present on admission) - restarted daily vitamin D supplementation   Essential hypertension- (present on admission) - continue home HCTZ therapy/add amlodipine 5 mg  Class 3 severe obesity with serious comorbidity and body mass index (BMI) of 45.0 to 49.9 in adult [facility]) - Pt  had plans to follow up with [facility].   Seropositive rheumatoid arthritis ([place])- (present on admission) - She is followed by an outpatient rheumatologist - Resume home plaquenil therapy   OSA on CPAP -continue nightly CPAP therapy - she has been compliant using in hospital   DVT prophylaxis: enoxaparin Code Status: full  Family Communication: bedside  update 2/16 Disposition: Status is: Inpatient Remains inpatient appropriate because: IV steroids required    Consultants:  Pulmonologist  Procedures:   Antimicrobials:    Subjective: Pt reports the continuous neb helped yesterday.    Objective: Vitals:   07/19/21 1600 07/19/21 1605 07/19/21 1624 07/19/21 1629  BP: 138/79   137/70  Pulse: 83 88  72  Resp: 16 20  20   Temp:  97.9 F (36.6 C)  97.8 F (36.6 C)  TempSrc:  Oral  Oral  SpO2: 94% 96%  93%  Weight:   124.2 kg   Height:   5\' 4"  (1.626 m)     Intake/Output Summary (Last 24 hours) at 07/19/2021 1731 Last data filed at 07/19/2021 0600 Gross per 24 hour  Intake 540 ml  Output --  Net 540 ml   Filed Weights   07/17/21 1749 07/18/21 0600 07/19/21 1624  Weight: 125.6 kg 125.4 kg 124.2 kg   Examination:  General exam: Appears calm and comfortable  Respiratory system: diffuse wheezing slightly improved from prior exam, moderated increased work of breathing. Cardiovascular system: normal S1 & S2 heard. Tachycardic rate.  No JVD, murmurs, rubs, gallops or clicks. No pedal edema. Gastrointestinal system: Abdomen is nondistended, soft and nontender. No organomegaly or masses felt. Normal bowel sounds heard. Central nervous system: Alert and oriented. No focal neurological deficits. Extremities: Symmetric 5 x 5 power. Skin: No rashes, lesions or ulcers. Psychiatry: Judgement and insight appear normal. Mood & affect appropriate.   Data Reviewed: I have personally reviewed following labs and imaging studies  CBC: Recent Labs  Lab 07/17/21 0712  WBC 6.9  NEUTROABS 4.3  HGB 12.6  HCT 40.1  MCV 83.9  PLT [id number]    Basic Metabolic Panel: Recent Labs  Lab 07/17/21 0712 07/18/21 0346  NA 141 137  K 3.7 4.3  CL 108 108  CO2 21* 19*  GLUCOSE 127* 196*  BUN 8 10  CREATININE 0.61 0.64  CALCIUM 8.8* 9.0  MG  --  2.0    CBG: Recent Labs  Lab 07/18/21 1156 07/18/21 2134 07/19/21 0745 07/19/21 1138  07/19/21 1644  GLUCAP 220* 227* 103* 164* 121*    Recent Results (from the past 240 hour(s))  Resp Panel by RT-PCR (Flu A&B, Covid) Nasopharyngeal Swab     Status: None   Collection Time: 07/17/21  7:12 AM   Specimen: Nasopharyngeal Swab; Nasopharyngeal(NP) swabs in vial transport medium  Result Value Ref Range Status   SARS Coronavirus 2 by RT PCR NEGATIVE NEGATIVE Final    Comment: (NOTE) SARS-CoV-2 target nucleic acids are NOT DETECTED.  The SARS-CoV-2 RNA is generally detectable in upper respiratory specimens during the acute phase of infection. The lowest concentration of SARS-CoV-2 viral copies this assay can detect is 138 copies/mL. A negative result does not preclude SARS-Cov-2 infection and should not be used as the sole basis for treatment or other patient management decisions. A negative result may occur with  improper specimen collection/handling, submission of specimen other than nasopharyngeal swab, presence of viral mutation(s) within the areas targeted by this assay, and inadequate number of viral copies(<138 copies/mL). A negative result  must be combined with clinical observations, patient history, and epidemiological information. The expected result is Negative.  Fact Sheet for Patients:  EntrepreneurPulse.com.au  Fact Sheet for Healthcare Providers:  IncredibleEmployment.be  This test is no t yet approved or cleared by the [place] FDA and  has been authorized for detection and/or diagnosis of SARS-CoV-2 by FDA under an Emergency Use Authorization (EUA). This EUA will remain  in effect (meaning this test can be used) for the duration of the COVID-19 declaration under Section 564(b)(1) of the Act, 21 U.S.C.section 360bbb-3(b)(1), unless the authorization is terminated  or revoked sooner.       Influenza A by PCR NEGATIVE NEGATIVE Final   Influenza B by PCR NEGATIVE NEGATIVE Final    Comment: (NOTE) The Xpert  Xpress SARS-CoV-2/FLU/RSV plus assay is intended as an aid in the diagnosis of influenza from Nasopharyngeal swab specimens and should not be used as a sole basis for treatment. Nasal washings and aspirates are unacceptable for Xpert Xpress SARS-CoV-2/FLU/RSV testing.  Fact Sheet for Patients: EntrepreneurPulse.com.au  Fact Sheet for Healthcare Providers: IncredibleEmployment.be  This test is not yet approved or cleared by the [place] FDA and has been authorized for detection and/or diagnosis of SARS-CoV-2 by FDA under an Emergency Use Authorization (EUA). This EUA will remain in effect (meaning this test can be used) for the duration of the COVID-19 declaration under Section 564(b)(1) of the Act, 21 U.S.C. section 360bbb-3(b)(1), unless the authorization is terminated or revoked.  Performed at [facility], [address], [place], [place] [address]   MRSA Next Gen by PCR, Nasal     Status: Abnormal   Collection Time: 07/17/21  5:55 PM   Specimen: Nasal Mucosa; Nasal Swab  Result Value Ref Range Status   MRSA by PCR Next Gen DETECTED (A) NOT DETECTED Final    Comment: RESULT CALLED TO, READ BACK BY AND VERIFIED WITH: Shaune Spittle RN [phone] K FORSYTH (NOTE) The GeneXpert MRSA Assay (FDA approved for NASAL specimens only), is one component of a comprehensive MRSA colonization surveillance program. It is not intended to diagnose MRSA infection nor to guide or monitor treatment for MRSA infections. Test performance is not FDA approved in patients less than 79 years old. Performed at [facility], [address] [place], [place] [address]      Radiology Studies: No results found.  Scheduled Meds:  albuterol  2.5 mg Nebulization Q4H   albuterol  15 mg/hr Nebulization Once   amLODipine  5 mg Oral Daily   arformoterol  15 mcg Nebulization BID   budesonide (PULMICORT) nebulizer solution  0.25 mg Nebulization BID   busPIRone  7.5 mg Oral  BID   Chlorhexidine Gluconate Cloth  6 each Topical Daily   cholecalciferol  5,000 Units Oral Daily   DULoxetine  60 mg Oral Daily   enoxaparin (LOVENOX) injection  60 mg Subcutaneous Daily   fluticasone  2 spray Each Nare Daily   hydrochlorothiazide  25 mg Oral Daily   hydroxychloroquine  200 mg Oral BID   insulin aspart  0-20 Units Subcutaneous TID WC   insulin aspart  0-5 Units Subcutaneous QHS   insulin aspart  12 Units Subcutaneous TID WC   insulin glargine-yfgn  30 Units Subcutaneous Daily   levETIRAcetam  500 mg Oral BID   loratadine  10 mg Oral Daily   methylPREDNISolone (SOLU-MEDROL) injection  60 mg Intravenous Q12H   montelukast  10 mg Oral QHS   mupirocin ointment  1 application Nasal  BID   pantoprazole  40 mg Oral BID AC   pregabalin  100 mg Oral BID   QUEtiapine  25 mg Oral BID   revefenacin  175 mcg Nebulization Daily   Continuous Infusions:  albuterol 10 mg/hr (07/18/21 1436)     LOS: 2 days   Time spent: 35 mins   Idolina Mantell Wynetta Emery, MD How to contact the [facility] Attending or Consulting provider [place] or covering provider during after hours [place], for this patient?  Check the care team in [facility] and look for a) attending/consulting [facility] provider listed and b) the [facility] team listed Log into [email or web address] and use [place]'s universal password to access. If you do not have the password, please contact the hospital operator. Locate the [facility] provider you are looking for under [facility] and page to a number that you can be directly reached. If you still have difficulty reaching the provider, please page the [facility] (Director on Call) for the Hospitalists listed on amion for assistance.  07/19/2021, 5:31 PM

## 2021-07-19 NOTE — Telephone Encounter (Signed)
Please advise 

## 2021-07-20 ENCOUNTER — Inpatient Hospital Stay (HOSPITAL_COMMUNITY): Payer: Medicaid Other

## 2021-07-20 DIAGNOSIS — K219 Gastro-esophageal reflux disease without esophagitis: Secondary | ICD-10-CM | POA: Diagnosis not present

## 2021-07-20 DIAGNOSIS — R739 Hyperglycemia, unspecified: Secondary | ICD-10-CM

## 2021-07-20 DIAGNOSIS — I10 Essential (primary) hypertension: Secondary | ICD-10-CM | POA: Diagnosis not present

## 2021-07-20 DIAGNOSIS — I5032 Chronic diastolic (congestive) heart failure: Secondary | ICD-10-CM

## 2021-07-20 DIAGNOSIS — I5030 Unspecified diastolic (congestive) heart failure: Secondary | ICD-10-CM | POA: Diagnosis present

## 2021-07-20 DIAGNOSIS — J9601 Acute respiratory failure with hypoxia: Secondary | ICD-10-CM | POA: Diagnosis not present

## 2021-07-20 DIAGNOSIS — Z87898 Personal history of other specified conditions: Secondary | ICD-10-CM | POA: Diagnosis not present

## 2021-07-20 DIAGNOSIS — J4551 Severe persistent asthma with (acute) exacerbation: Secondary | ICD-10-CM | POA: Diagnosis not present

## 2021-07-20 LAB — RENAL FUNCTION PANEL
Albumin: 3.4 g/dL — ABNORMAL LOW (ref 3.5–5.0)
Anion gap: 12 (ref 5–15)
BUN: 14 mg/dL (ref 6–20)
CO2: 28 mmol/L (ref 22–32)
Calcium: 8.6 mg/dL — ABNORMAL LOW (ref 8.9–10.3)
Chloride: 97 mmol/L — ABNORMAL LOW (ref 98–111)
Creatinine, Ser: 0.78 mg/dL (ref 0.44–1.00)
GFR, Estimated: >60 mL/min (ref >60)
Glucose, Bld: 103 mg/dL — ABNORMAL HIGH (ref 70–99)
Phosphorus: 5.6 mg/dL — ABNORMAL HIGH (ref 2.5–4.6)
Potassium: 3.4 mmol/L — ABNORMAL LOW (ref 3.5–5.1)
Sodium: 137 mmol/L (ref 135–145)

## 2021-07-20 LAB — POCT I-STAT 7, (LYTES, BLD GAS, ICA,H+H)
Acid-Base Excess: 7 mmol/L — ABNORMAL HIGH (ref 0.0–2.0)
Bicarbonate: 32.4 mmol/L — ABNORMAL HIGH (ref 20.0–28.0)
Calcium, Ion: 1.16 mmol/L (ref 1.15–1.40)
HCT: 41 % (ref 36.0–46.0)
Hemoglobin: 13.9 g/dL (ref 12.0–15.0)
O2 Saturation: 97 %
Potassium: 3.3 mmol/L — ABNORMAL LOW (ref 3.5–5.1)
Sodium: 135 mmol/L (ref 135–145)
TCO2: 34 mmol/L — ABNORMAL HIGH (ref 22–32)
pCO2 arterial: 49.1 mmHg — ABNORMAL HIGH (ref 32–48)
pH, Arterial: 7.427 (ref 7.35–7.45)
pO2, Arterial: 94 mmHg (ref 83–108)

## 2021-07-20 LAB — BLOOD GAS, ARTERIAL
Acid-Base Excess: 9.6 mmol/L — ABNORMAL HIGH (ref 0.0–2.0)
Acid-Base Excess: 11 mmol/L — ABNORMAL HIGH (ref 0.0–2.0)
Bicarbonate: 30.1 mmol/L — ABNORMAL HIGH (ref 20.0–28.0)
Bicarbonate: 35.1 mmol/L — ABNORMAL HIGH (ref 20.0–28.0)
Drawn by: 27016
Drawn by: 27016
FIO2: 21 %
FIO2: 100 %
O2 Saturation: 98.5 %
O2 Saturation: 100 %
Patient temperature: 37
Patient temperature: 37.2
pCO2 arterial: 28 mmHg — ABNORMAL LOW (ref 32–48)
pCO2 arterial: 43 mmHg (ref 32–48)
pH, Arterial: 7.64 (ref 7.35–7.45)
pH, Arterial: 7.52 — ABNORMAL HIGH (ref 7.35–7.45)
pO2, Arterial: 74 mmHg — ABNORMAL LOW (ref 83–108)
pO2, Arterial: 186 mmHg — ABNORMAL HIGH (ref 83–108)

## 2021-07-20 LAB — TROPONIN I (HIGH SENSITIVITY): Troponin I (High Sensitivity): 3 ng/L (ref <18)

## 2021-07-20 LAB — D-DIMER, QUANTITATIVE: D-Dimer, Quant: 0.4 ug/mL-FEU (ref 0.00–0.50)

## 2021-07-20 LAB — GLUCOSE, CAPILLARY
Glucose-Capillary: 125 mg/dL — ABNORMAL HIGH (ref 70–99)
Glucose-Capillary: 157 mg/dL — ABNORMAL HIGH (ref 70–99)
Glucose-Capillary: 149 mg/dL — ABNORMAL HIGH (ref 70–99)
Glucose-Capillary: 149 mg/dL — ABNORMAL HIGH (ref 70–99)
Glucose-Capillary: 124 mg/dL — ABNORMAL HIGH (ref 70–99)
Glucose-Capillary: 110 mg/dL — ABNORMAL HIGH (ref 70–99)
Glucose-Capillary: 94 mg/dL (ref 70–99)
Glucose-Capillary: 138 mg/dL — ABNORMAL HIGH (ref 70–99)

## 2021-07-20 LAB — MAGNESIUM: Magnesium: 2.2 mg/dL (ref 1.7–2.4)

## 2021-07-20 IMAGING — DX DG CHEST 1V SAME DAY
2 series · 2 of 2 positions shown · non-contrast
Comparison: Earlier same day radiograph at [Y1] hours

CLINICAL DATA: Post intubation. Earlier same day radiograph
demonstrated right mainstem bronchus intubation.

EXAM:
CHEST - 1 VIEW SAME DAY

[chest ap (1 of 2)]
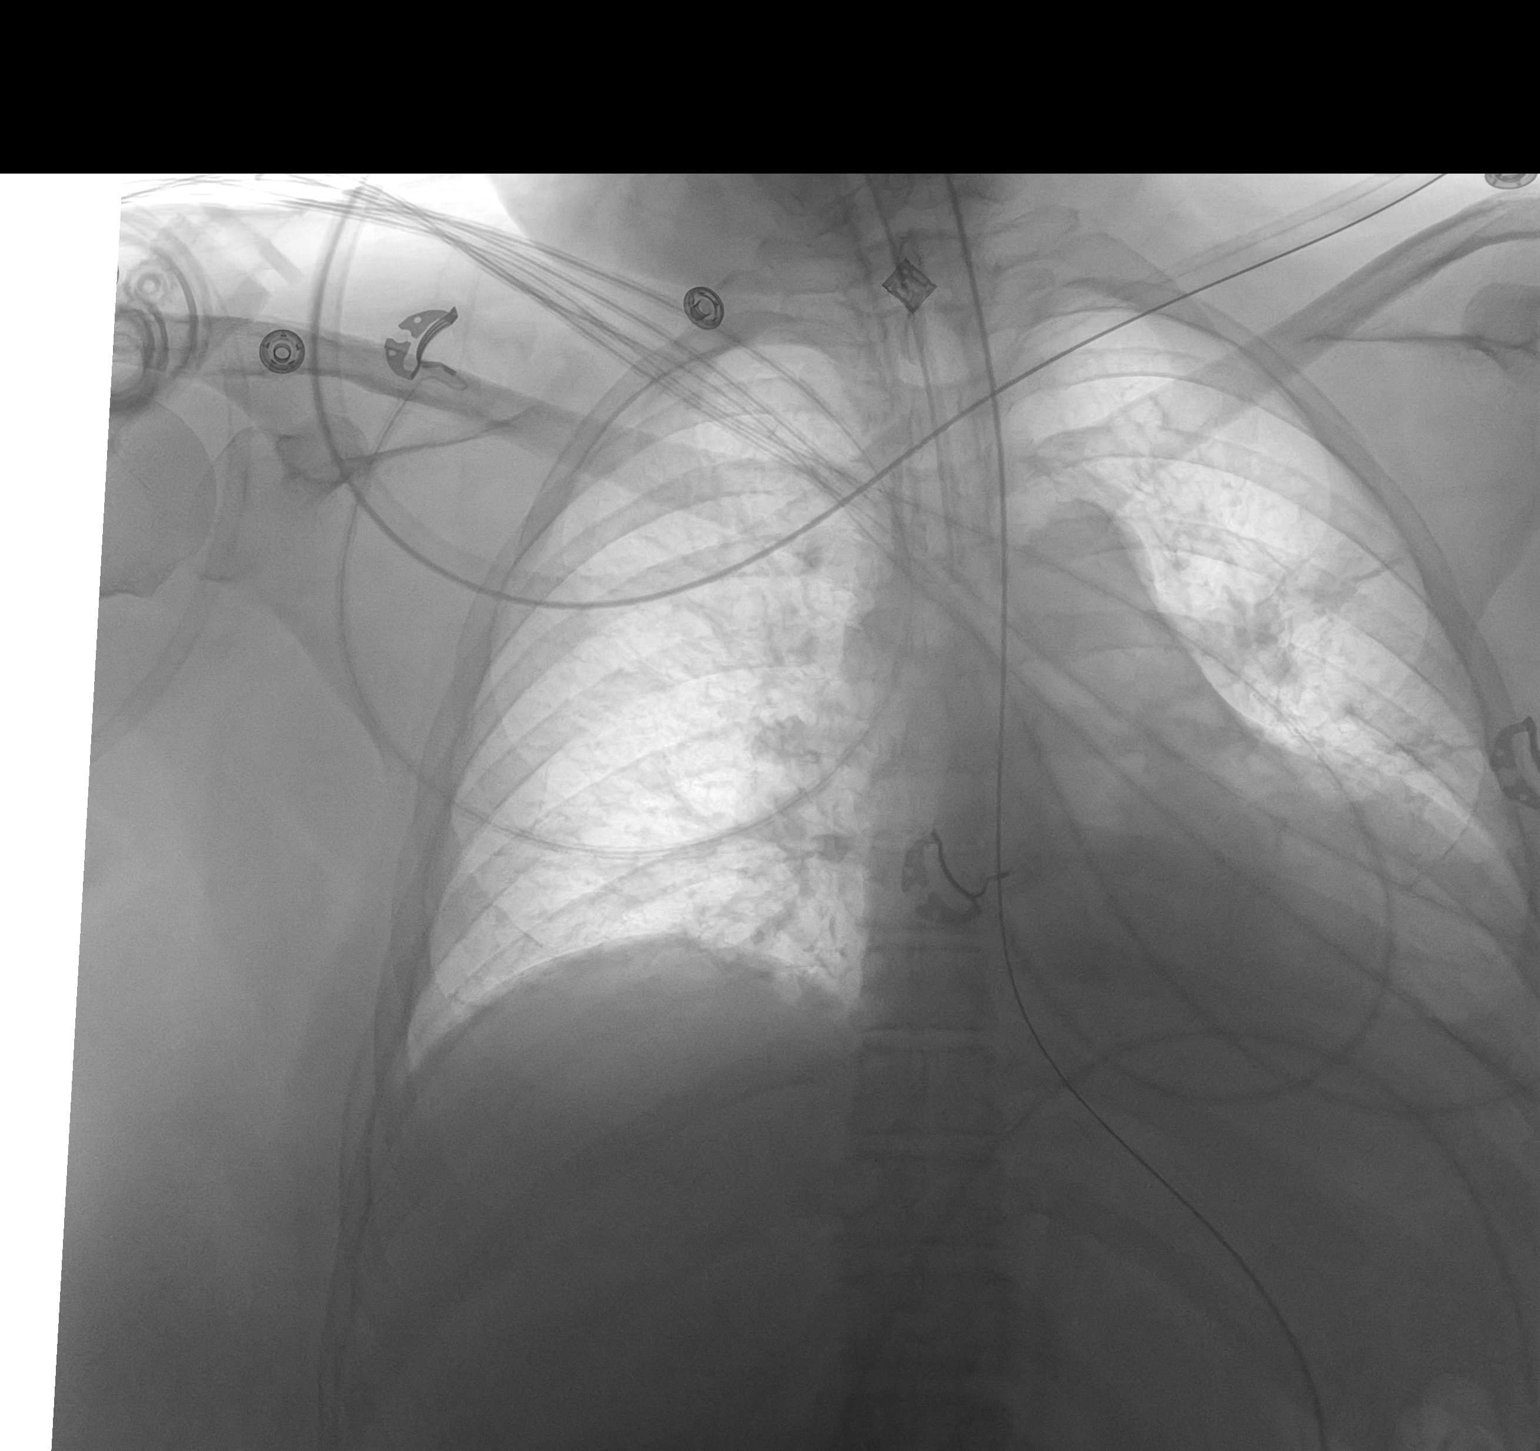

[chest ap (2 of 2)]
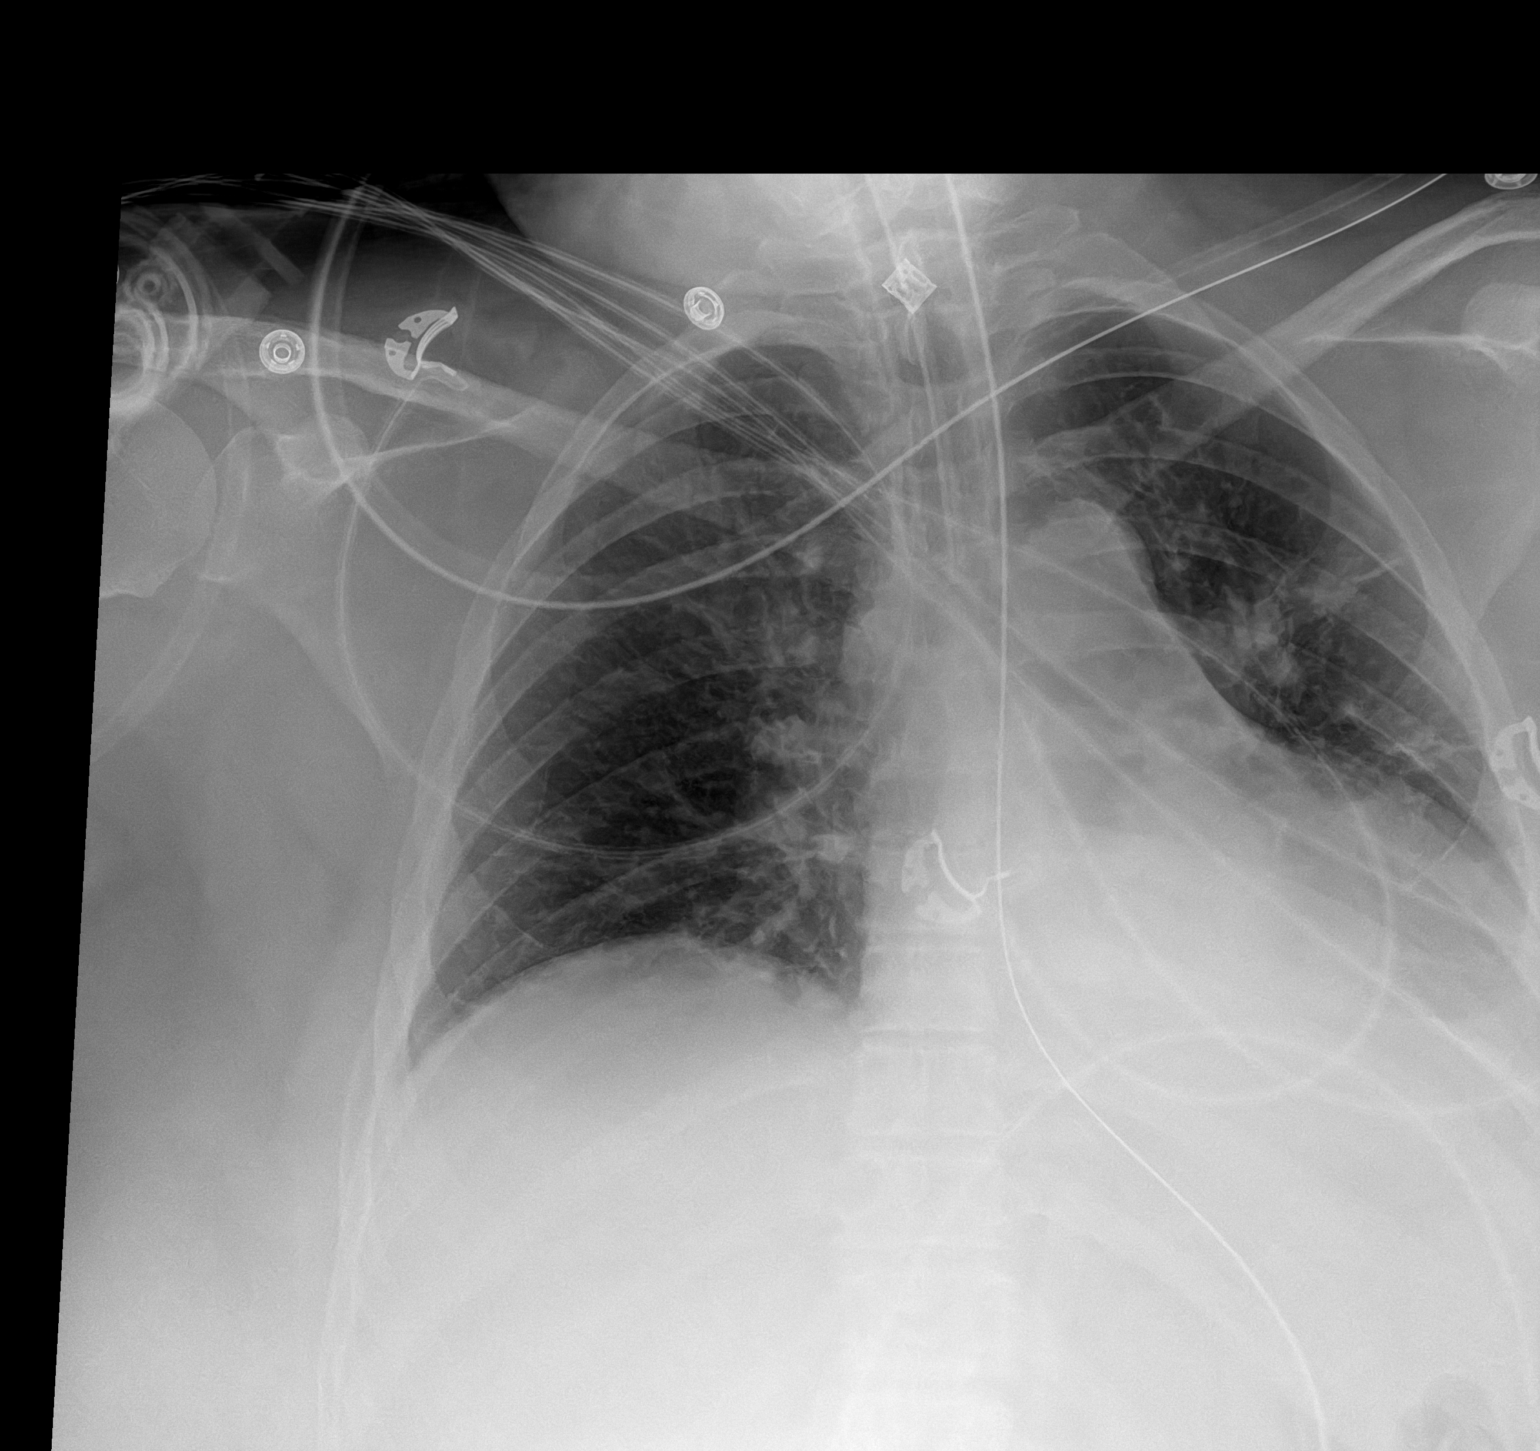

[2 of 2 positions shown; findings below may reference images not displayed]

FINDINGS: Of note, the left costophrenic angle is not included on the field of
view.

Interval retraction of the endotracheal tube with the tip now
projecting approximately 0.9 cm above the carina.

Enteric tube tip is below the diaphragm but not included on the
image.

Unchanged left basilar opacity. Thin linear subsegmental atelectasis
in the left upper and mid lung. The right lung is clear. No
pneumothorax.
IMPRESSION: Interval retraction of the endotracheal tube with the tip now
projecting approximately 0.9 cm above the carina. Remainder of the
exam is stable.

## 2021-07-20 IMAGING — DX DG CHEST 1V SAME DAY
1 series · 1 of 1 positions shown · non-contrast
Comparison: Earlier today

CLINICAL DATA: Status post intubation for shortness of breath.
History of hypertension and diabetes.

EXAM:
CHEST - 1 VIEW SAME DAY

[chest ap]
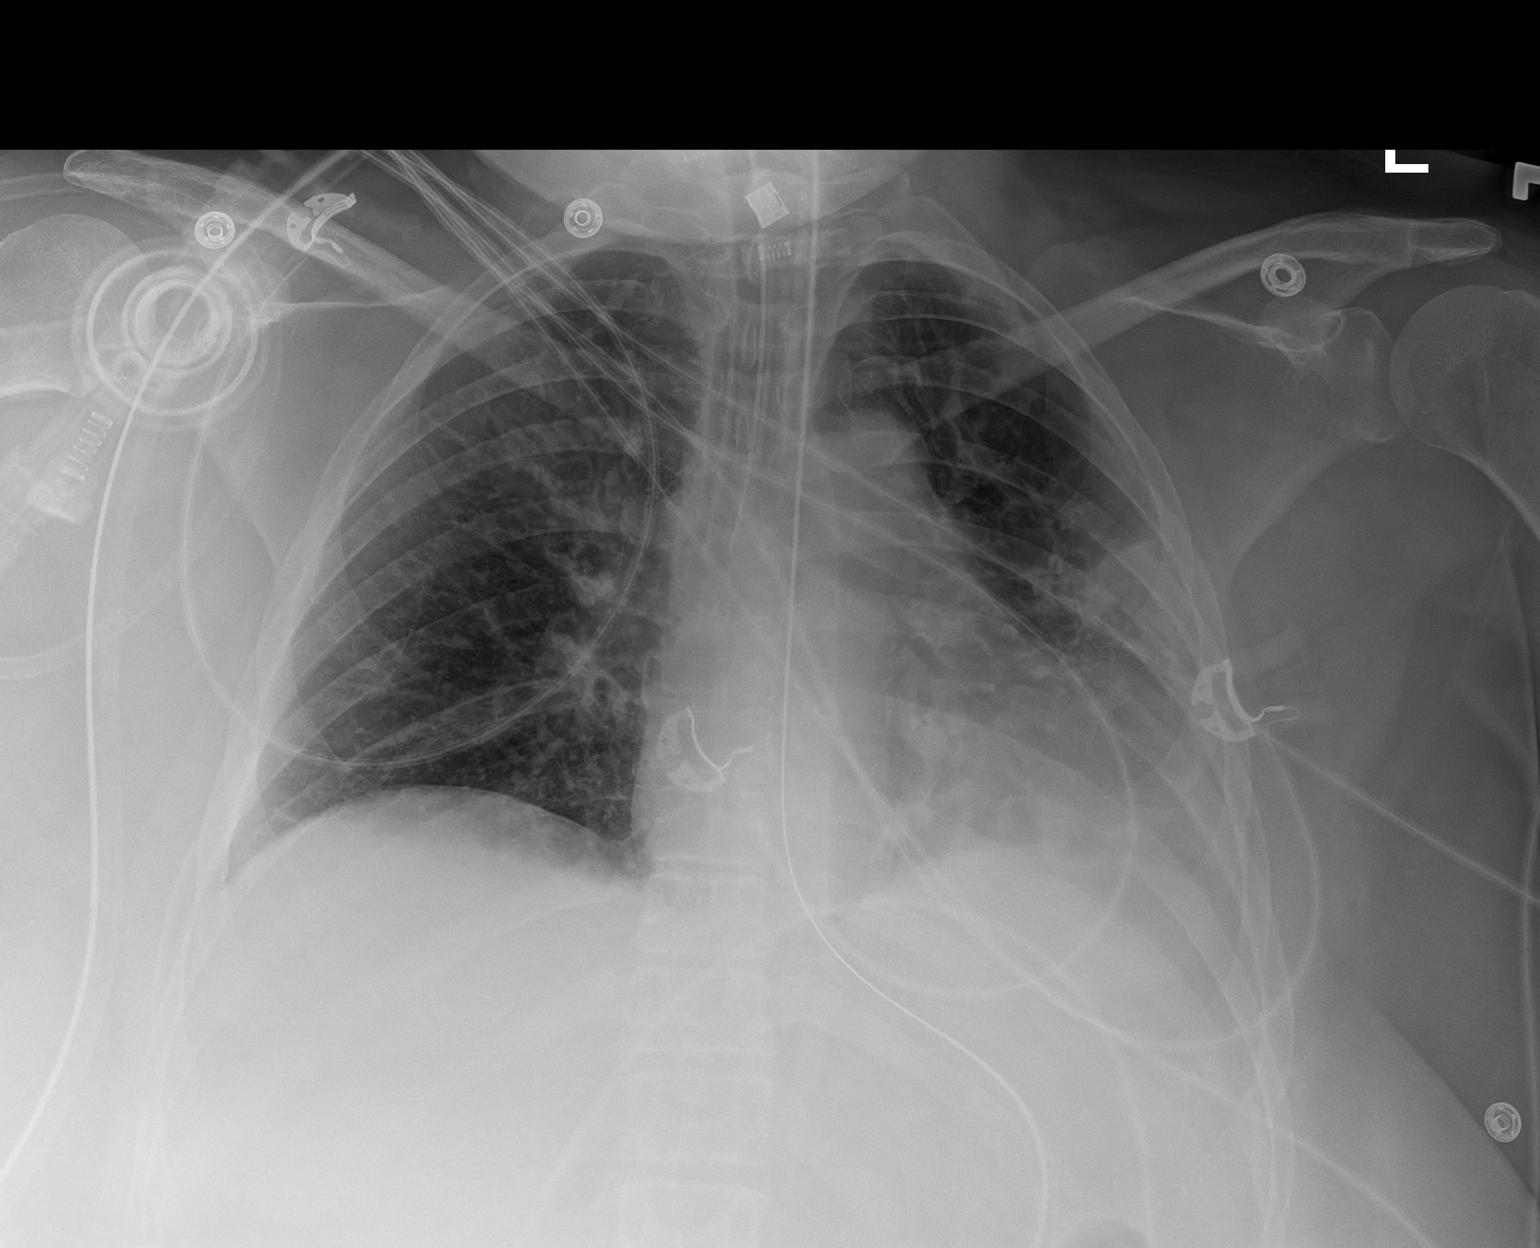

[1 of 1 positions shown; findings below may reference images not displayed]

FINDINGS: The ET tube tip is in the right mainstem bronchus. Recommend
withdrawing by at least 2 cm. Nasogastric tube courses below the
level of the diaphragms with tip below the field of view. Left
midlung and left base opacities are new from the previous exam.
Right lung appears clear.
IMPRESSION: 1. Right mainstem bronchus intubation. Recommend withdrawing by at
least 2 cm.
2. New left midlung and left base opacities compatible with
atelectasis and/or airspace consolidation.

## 2021-07-20 IMAGING — DX DG CHEST 1V PORT
1 series · 1 of 1 positions shown · non-contrast
Comparison: Earlier same day

CLINICAL DATA: Ventilator dependence.

EXAM:
PORTABLE CHEST 1 VIEW

[chest ap]
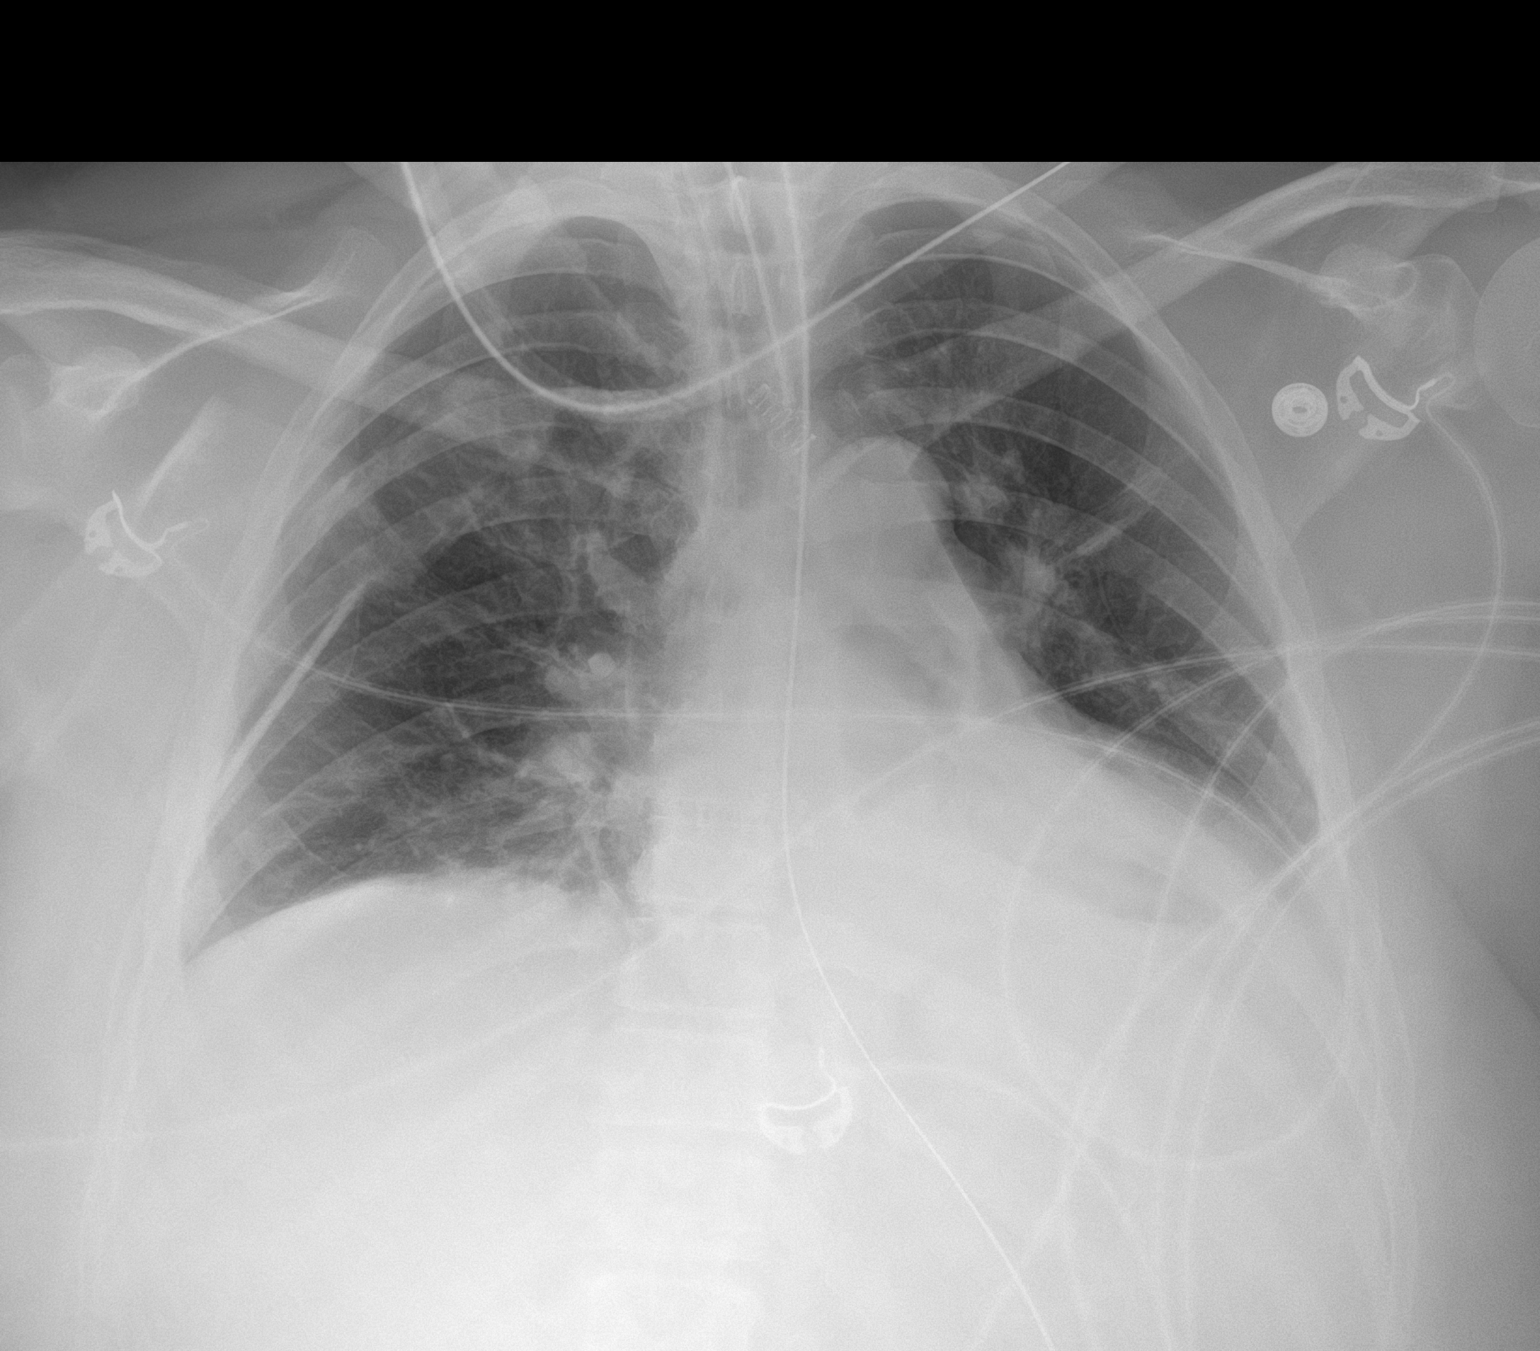

[1 of 1 positions shown; findings below may reference images not displayed]

FINDINGS: [N7] hours. Endotracheal tube tip is 3.7 cm above the base of the
carina. The NG tube passes into the stomach although the distal tip
position is not included on the film. Low lung volumes with
retrocardiac left base collapse/consolidation. Cardiopericardial
silhouette is at upper limits of normal for size. Telemetry leads
overlie the chest.
IMPRESSION: 1. Endotracheal tube tip is 3.7 cm above the base of the carina.
2. Low volume film with retrocardiac left base
collapse/consolidation.

## 2021-07-20 IMAGING — DX DG CHEST 1V PORT
1 series · 1 of 1 positions shown · non-contrast
Comparison: [DATE]

CLINICAL DATA: Shortness of breath, chest pain

EXAM:
PORTABLE CHEST 1 VIEW

[chest ap]
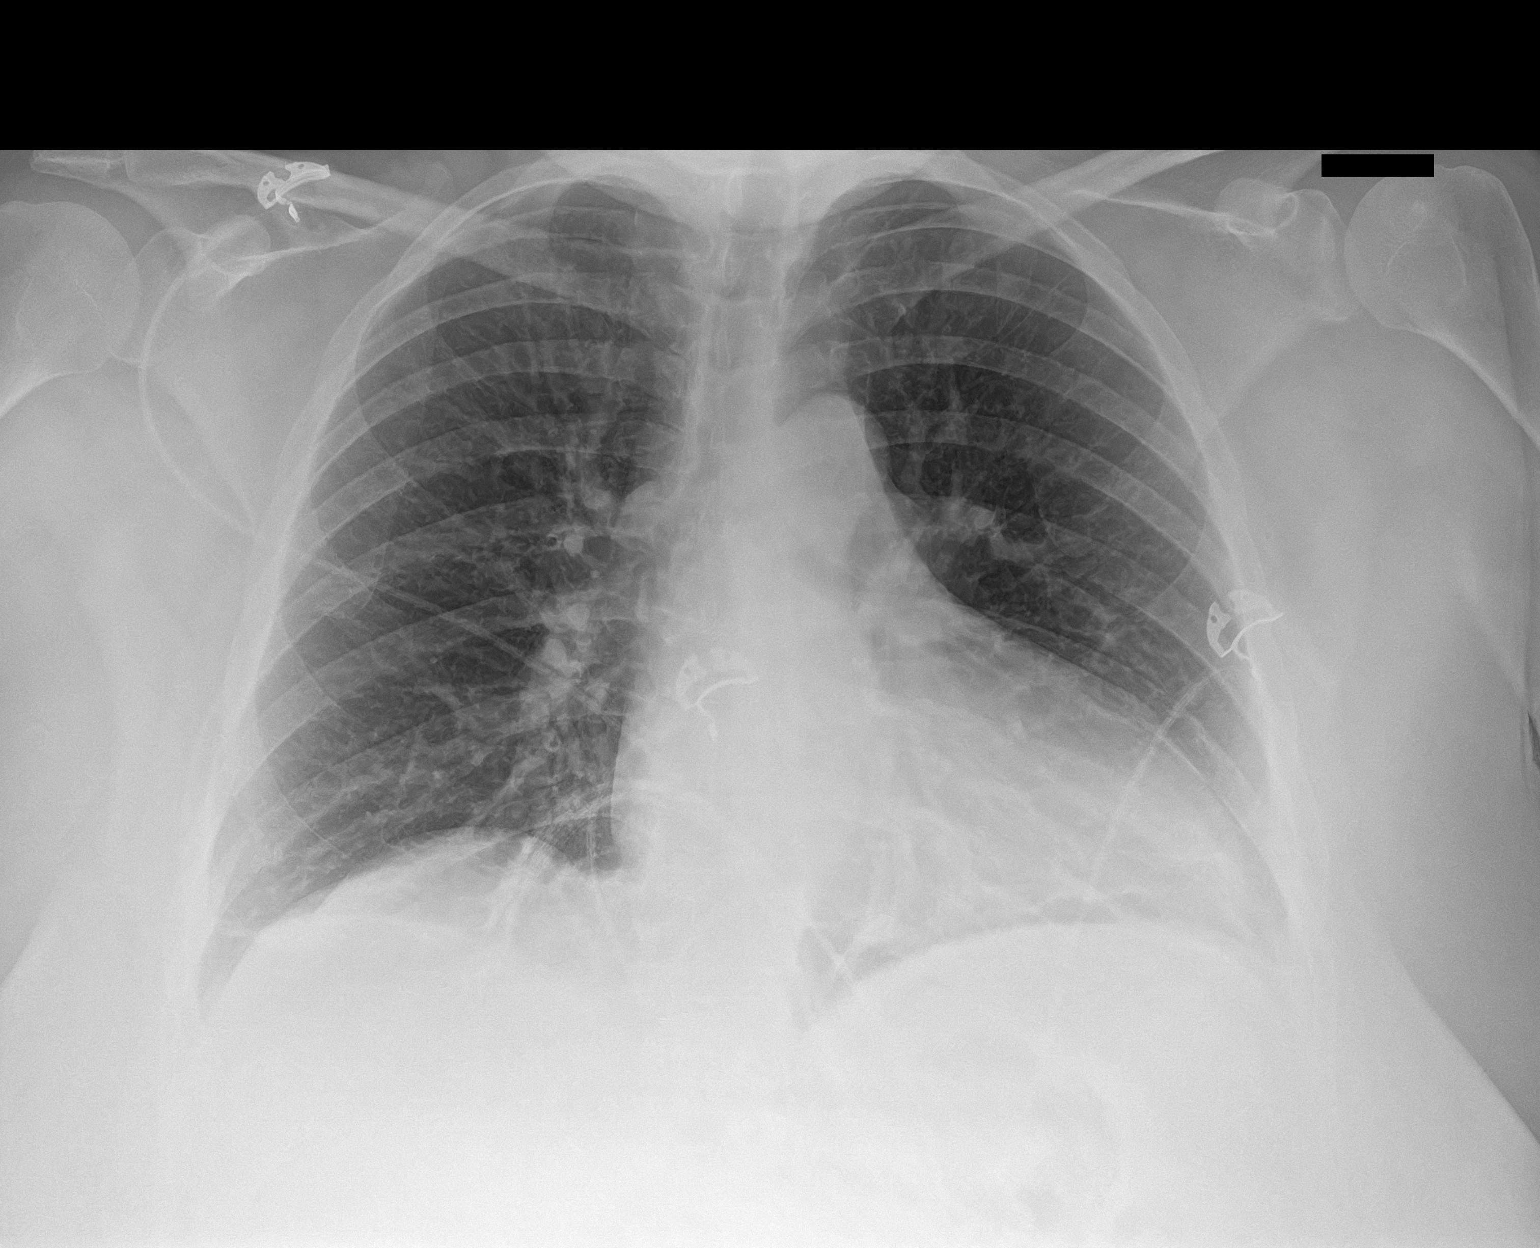

[1 of 1 positions shown; findings below may reference images not displayed]

FINDINGS: Cardiomegaly. Both lungs are clear. The visualized skeletal
structures are unremarkable.
IMPRESSION: Cardiomegaly without acute abnormality of the lungs in AP portable
projection.

## 2021-07-20 MED ORDER — HYDROXYCHLOROQUINE SULFATE 200 MG PO TABS
200.0000 mg | ORAL_TABLET | Freq: Two times a day (BID) | ORAL | Status: DC
  Administered 2021-07-20 – 2021-07-22 (×5): 200 mg
  Filled 2021-07-20 (×10): qty 1

## 2021-07-20 MED ORDER — ORAL CARE MOUTH RINSE
15.0000 mL | OROMUCOSAL | Status: DC
  Administered 2021-07-20 – 2021-07-22 (×22): 15 mL via OROMUCOSAL

## 2021-07-20 MED ORDER — FENTANYL BOLUS VIA INFUSION
50.0000 ug | INTRAVENOUS | Status: DC | PRN
  Administered 2021-07-20: 50 ug via INTRAVENOUS
  Filled 2021-07-20: qty 100

## 2021-07-20 MED ORDER — ALUM & MAG HYDROXIDE-SIMETH 200-200-20 MG/5ML PO SUSP
30.0000 mL | Freq: Once | ORAL | Status: AC
  Administered 2021-07-20: 30 mL via ORAL
  Filled 2021-07-20: qty 30

## 2021-07-20 MED ORDER — FENTANYL CITRATE (PF) 100 MCG/2ML IJ SOLN
INTRAMUSCULAR | Status: AC
  Administered 2021-07-20: 100 ug
  Filled 2021-07-20: qty 2

## 2021-07-20 MED ORDER — PROPOFOL 1000 MG/100ML IV EMUL
0.0000 ug/kg/min | INTRAVENOUS | Status: DC
  Administered 2021-07-20: 30 ug/kg/min via INTRAVENOUS
  Administered 2021-07-20 (×2): 45 ug/kg/min via INTRAVENOUS
  Administered 2021-07-20: 5 ug/kg/min via INTRAVENOUS
  Administered 2021-07-21 (×2): 40 ug/kg/min via INTRAVENOUS
  Administered 2021-07-21: 30 ug/kg/min via INTRAVENOUS
  Administered 2021-07-21: 40 ug/kg/min via INTRAVENOUS
  Administered 2021-07-21 (×2): 30 ug/kg/min via INTRAVENOUS
  Administered 2021-07-22 (×3): 40 ug/kg/min via INTRAVENOUS
  Filled 2021-07-20 (×2): qty 200
  Filled 2021-07-20 (×5): qty 100
  Filled 2021-07-20: qty 200
  Filled 2021-07-20 (×2): qty 100

## 2021-07-20 MED ORDER — FENTANYL 2500MCG IN NS 250ML (10MCG/ML) PREMIX INFUSION
50.0000 ug/h | INTRAVENOUS | Status: DC
  Administered 2021-07-20: 50 ug/h via INTRAVENOUS
  Administered 2021-07-21: 100 ug/h via INTRAVENOUS
  Filled 2021-07-20 (×2): qty 250

## 2021-07-20 MED ORDER — METHYLPREDNISOLONE SODIUM SUCC 125 MG IJ SOLR
40.0000 mg | Freq: Two times a day (BID) | INTRAMUSCULAR | Status: DC
  Administered 2021-07-21: 40 mg via INTRAVENOUS
  Filled 2021-07-20 (×2): qty 2

## 2021-07-20 MED ORDER — POTASSIUM CHLORIDE CRYS ER 20 MEQ PO TBCR: 20.0000 meq | EXTENDED_RELEASE_TABLET | Freq: Once | ORAL | Status: DC

## 2021-07-20 MED ORDER — LORATADINE 10 MG PO TABS
10.0000 mg | ORAL_TABLET | Freq: Every day | ORAL | Status: DC
  Administered 2021-07-21 – 2021-07-22 (×2): 10 mg via NASOGASTRIC
  Filled 2021-07-20 (×2): qty 1

## 2021-07-20 MED ORDER — LEVETIRACETAM IN NACL 500 MG/100ML IV SOLN
500.0000 mg | Freq: Two times a day (BID) | INTRAVENOUS | Status: DC
  Administered 2021-07-20 – 2021-07-21 (×3): 500 mg via INTRAVENOUS
  Filled 2021-07-20 (×3): qty 100

## 2021-07-20 MED ORDER — ETOMIDATE 2 MG/ML IV SOLN
20.0000 mg | Freq: Once | INTRAVENOUS | Status: AC
  Administered 2021-07-20: 20 mg via INTRAVENOUS

## 2021-07-20 MED ORDER — DOCUSATE SODIUM 50 MG/5ML PO LIQD
100.0000 mg | Freq: Two times a day (BID) | ORAL | Status: DC
  Administered 2021-07-20 – 2021-07-22 (×5): 100 mg
  Filled 2021-07-20 (×5): qty 10

## 2021-07-20 MED ORDER — PREGABALIN 25 MG PO CAPS: 50.0000 mg | ORAL_CAPSULE | Freq: Two times a day (BID) | ORAL | Status: DC

## 2021-07-20 MED ORDER — PANTOPRAZOLE 2 MG/ML SUSPENSION
40.0000 mg | Freq: Two times a day (BID) | ORAL | Status: DC
  Administered 2021-07-20 – 2021-07-22 (×4): 40 mg
  Filled 2021-07-20 (×4): qty 20

## 2021-07-20 MED ORDER — QUETIAPINE FUMARATE 25 MG PO TABS: 25.0000 mg | ORAL_TABLET | Freq: Every day | ORAL | Status: DC

## 2021-07-20 MED ORDER — BUDESONIDE 0.5 MG/2ML IN SUSP
0.5000 mg | Freq: Two times a day (BID) | RESPIRATORY_TRACT | Status: DC
  Administered 2021-07-20 – 2021-07-25 (×10): 0.5 mg via RESPIRATORY_TRACT
  Filled 2021-07-20 (×10): qty 2

## 2021-07-20 MED ORDER — MIDAZOLAM HCL 2 MG/2ML IJ SOLN
INTRAMUSCULAR | Status: AC
  Administered 2021-07-20: 2 mg
  Filled 2021-07-20: qty 2

## 2021-07-20 MED ORDER — ROCURONIUM BROMIDE 50 MG/5ML IV SOLN
100.0000 mg | Freq: Once | INTRAVENOUS | Status: AC
  Administered 2021-07-20: 90 mg via INTRAVENOUS

## 2021-07-20 MED ORDER — INSULIN ASPART 100 UNIT/ML IJ SOLN
0.0000 [IU] | INTRAMUSCULAR | Status: DC
  Administered 2021-07-20: 2 [IU] via SUBCUTANEOUS
  Administered 2021-07-21: 3 [IU] via SUBCUTANEOUS
  Administered 2021-07-21 (×5): 2 [IU] via SUBCUTANEOUS
  Administered 2021-07-22: 3 [IU] via SUBCUTANEOUS
  Administered 2021-07-22 (×4): 2 [IU] via SUBCUTANEOUS
  Administered 2021-07-23 (×3): 3 [IU] via SUBCUTANEOUS
  Administered 2021-07-23 – 2021-07-24 (×2): 2 [IU] via SUBCUTANEOUS
  Administered 2021-07-24 (×3): 3 [IU] via SUBCUTANEOUS
  Administered 2021-07-24 – 2021-07-25 (×2): 2 [IU] via SUBCUTANEOUS
  Administered 2021-07-25 (×2): 3 [IU] via SUBCUTANEOUS
  Administered 2021-07-25: 2 [IU] via SUBCUTANEOUS

## 2021-07-20 MED ORDER — BUSPIRONE HCL 15 MG PO TABS
7.5000 mg | ORAL_TABLET | Freq: Two times a day (BID) | ORAL | Status: DC
  Administered 2021-07-20 – 2021-07-22 (×5): 7.5 mg via NASOGASTRIC
  Filled 2021-07-20 (×5): qty 1

## 2021-07-20 MED ORDER — METHYLPREDNISOLONE SODIUM SUCC 125 MG IJ SOLR
40.0000 mg | Freq: Two times a day (BID) | INTRAMUSCULAR | Status: DC
  Administered 2021-07-20: 40 mg via INTRAVENOUS
  Filled 2021-07-20: qty 2

## 2021-07-20 MED ORDER — FENTANYL CITRATE PF 50 MCG/ML IJ SOSY
50.0000 ug | PREFILLED_SYRINGE | INTRAMUSCULAR | Status: DC | PRN
  Administered 2021-07-20: 50 ug via INTRAVENOUS
  Administered 2021-07-20 – 2021-07-22 (×3): 100 ug via INTRAVENOUS
  Filled 2021-07-20 (×2): qty 2

## 2021-07-20 MED ORDER — CHLORHEXIDINE GLUCONATE 0.12% ORAL RINSE (MEDLINE KIT)
15.0000 mL | Freq: Two times a day (BID) | OROMUCOSAL | Status: DC
  Administered 2021-07-20 – 2021-07-22 (×4): 15 mL via OROMUCOSAL

## 2021-07-20 MED ORDER — FENTANYL CITRATE (PF) 100 MCG/2ML IJ SOLN: 50.0000 ug | Freq: Once | INTRAMUSCULAR | Status: DC

## 2021-07-20 MED ORDER — FUROSEMIDE 10 MG/ML IJ SOLN
30.0000 mg | Freq: Once | INTRAMUSCULAR | Status: AC
  Administered 2021-07-20: 30 mg via INTRAVENOUS
  Filled 2021-07-20: qty 4

## 2021-07-20 MED ORDER — FENTANYL CITRATE PF 50 MCG/ML IJ SOSY
50.0000 ug | PREFILLED_SYRINGE | INTRAMUSCULAR | Status: DC | PRN
  Administered 2021-07-22: 50 ug via INTRAVENOUS

## 2021-07-20 MED ORDER — POLYETHYLENE GLYCOL 3350 17 G PO PACK
17.0000 g | PACK | Freq: Every day | ORAL | Status: DC
  Administered 2021-07-20 – 2021-07-22 (×3): 17 g
  Filled 2021-07-20 (×3): qty 1

## 2021-07-20 MED ORDER — MONTELUKAST SODIUM 10 MG PO TABS
10.0000 mg | ORAL_TABLET | Freq: Every day | ORAL | Status: DC
  Administered 2021-07-20 – 2021-07-22 (×3): 10 mg
  Filled 2021-07-20 (×3): qty 1

## 2021-07-20 NOTE — Progress Notes (Signed)
PROGRESS NOTE   Elizabeth Bautista  [id number] DOB: 07/24/1975 DOA: 07/17/2021 PCP: [name], MD   Chief Complaint  Patient presents with   Asthma   Level of care: Telemetry  Brief Admission History:  46 year old female with long history of severe persistent allergic asthma with history of multiple hospitalizations for asthma exacerbation in addition to history of intubation from asthma most recently discharged from the hospital in April 2022 for asthma exacerbation.  She presented to the emergency department with 3 days of progressive wheezing dyspnea and lightheadedness.  She has been seen by Dr. Halford Chessman in pulmonology who reported that breztri was not effective for her and yupelri was too expensive. She had been placed on a trial of Trelegy 200 one puff daily at her last office visit.  She said that she had been doing fairly well on trelegy until the weather started warming up and pollen counts began to rise.  She is having a persistent cough and chest congestion and loud audible wheezing with associated dyspnea on exertion.  She denies fever and chills.  She has OSA and is compliant with nightly CPAP.  She also has rheumatoid arthritis and has been on Plaquenil in the past and followed by Dr. [name] with [facility] rheumatology.  She also has morbid obesity and has [facility]    Pt was placed on continuous albuterol bronchodilator therapy in ED on arrival.  She had some improvement but not back to baseline and with her history of rapid decompensation it was thought best practice that she be monitored in hospital under observation.  Her CXR did not show any findings of pneumonia.  Her sars 2 coronavirus testing was negative.    07/18/2021 : reports not feeling any better, still with excess wheezing, pulmonary consult requested. Dr. Melvyn Novas will see her today.   07/19/2021:  some improvement but not back to baseline.  Ok to transfer out of  stepdown ICU.  Continue current management.    07/20/2021:  Pt had severe SOB when lying recumbent.  Still with diffuse wheezing, but patient reports overall some improvement from admission. Had some pleuritic chest pain symptoms from coughing.  CXR and EKG no acute changes.  2D echo ordered.    Assessment and Plan: * Severe asthma with exacerbation- (present on admission) - recurrent severe asthma exacerbation - she is followed by [facility] Pulmonology - Dr Halford Chessman Maretta Bees noted to be too expensive for outpatient use.  - At last OV was placed on trial of trelegy which she reports is helping.  - continue IV steroids, bronchodilators and respiratory support.  - remains full code  - Dr. Melvyn Novas consulted and recommended sending home on stiolto and stopping trelegy.   - 2D echo ordered 2/18 due to dyspnea exacerbated lying flat.    (HFpEF) heart failure with preserved ejection fraction ([place])- (present on admission) Pt complaining of worsening dyspnea when lying flat.   Update TTE.  IV lasix x 1 dose given.  CXR shows no pulm edema.   History of seizures - resume home keppra 500 mg BID   GERD (gastroesophageal reflux disease)- (present on admission) - protonix ordered for GI protection   Type 2 diabetes mellitus with hyperglycemia, without long-term current use of insulin ([place])- (present on admission) - pt having steroid induced hyperglycemia - ordered SSI coverage and frequent CBG monitoring - increased prandial novolog to 12 units TIDAC, resistant SSI, add semglee 30 units CBG (last  3)  Recent Labs    07/20/21 0256 07/20/21 0731 07/20/21 1117  GLUCAP 157* 149* 149*     Vitamin D deficiency- (present on admission) - restarted daily vitamin D supplementation   Essential hypertension- (present on admission) - continue home HCTZ therapy/add amlodipine 5 mg  Class 3 severe obesity with serious comorbidity and body mass index (BMI) of 45.0 to 49.9 in adult [facility]) - Pt had plans to  follow up with [facility].   Seropositive rheumatoid arthritis ([place])- (present on admission) - She is followed by an outpatient rheumatologist - Resume home plaquenil therapy   OSA on CPAP -continue nightly CPAP therapy - she has been compliant using in hospital   Upper airway cough syndrome- (present on admission) This is a long-standing issue and she is followed by a pulmonologist.   High risk medication use - monitoring closely with daily labs...  Anxiety- (present on admission) - Resumed home behavioral health medications.   DVT prophylaxis: enoxaparin Code Status: full  Family Communication: bedside update 2/16 Disposition: Status is: Inpatient Remains inpatient appropriate because: IV steroids required    Consultants:  Pulmonologist  Procedures:   Antimicrobials:    Subjective: Pt reports she became severely SOB this morning when lying flat in bed.     Objective: Vitals:   07/20/21 0718 07/20/21 0726 07/20/21 0830 07/20/21 1143  BP:   127/73   Pulse:   73   Resp:   (!) 23   Temp:   98.2 F (36.8 C)   TempSrc:   Oral   SpO2: 94% 94% 95% 93%  Weight:      Height:        Intake/Output Summary (Last 24 hours) at 07/20/2021 1156 Last data filed at 07/20/2021 1100 Gross per 24 hour  Intake 960 ml  Output 850 ml  Net 110 ml   Filed Weights   07/17/21 1749 07/18/21 0600 07/19/21 1624  Weight: 125.6 kg 125.4 kg 124.2 kg   Examination:  General exam: morbidly obese female lying on side, Appears calm but is uncomfortable.  No distress seen.  Respiratory system: diffuse wheezing heard bilaterally,worse today, moderated increased work of breathing. Cardiovascular system: normal S1 & S2 heard. Tachycardic rate.  No JVD, murmurs, rubs, gallops or clicks. No pedal edema. Gastrointestinal system: Abdomen is nondistended, soft and nontender. No organomegaly or masses felt. Normal bowel sounds heard. Central nervous system: Alert and oriented. No  focal neurological deficits. Extremities: Symmetric 5 x 5 power. Skin: No rashes, lesions or ulcers. Psychiatry: Judgement and insight appear normal. Mood & affect appropriate.   Data Reviewed: I have personally reviewed following labs and imaging studies  CBC: Recent Labs  Lab 07/17/21 0712  WBC 6.9  NEUTROABS 4.3  HGB 12.6  HCT 40.1  MCV 83.9  PLT [id number]    Basic Metabolic Panel: Recent Labs  Lab 07/17/21 0712 07/18/21 0346  NA 141 137  K 3.7 4.3  CL 108 108  CO2 21* 19*  GLUCOSE 127* 196*  BUN 8 10  CREATININE 0.61 0.64  CALCIUM 8.8* 9.0  MG  --  2.0    CBG: Recent Labs  Lab 07/19/21 1644 07/19/21 2015 07/20/21 0256 07/20/21 0731 07/20/21 1117  GLUCAP 121* 127* 157* 149* 149*    Recent Results (from the past 240 hour(s))  Resp Panel by RT-PCR (Flu A&B, Covid) Nasopharyngeal Swab     Status: None   Collection Time: 07/17/21  7:12 AM   Specimen: Nasopharyngeal Swab; Nasopharyngeal(NP) swabs  in vial transport medium  Result Value Ref Range Status   SARS Coronavirus 2 by RT PCR NEGATIVE NEGATIVE Final    Comment: (NOTE) SARS-CoV-2 target nucleic acids are NOT DETECTED.  The SARS-CoV-2 RNA is generally detectable in upper respiratory specimens during the acute phase of infection. The lowest concentration of SARS-CoV-2 viral copies this assay can detect is 138 copies/mL. A negative result does not preclude SARS-Cov-2 infection and should not be used as the sole basis for treatment or other patient management decisions. A negative result may occur with  improper specimen collection/handling, submission of specimen other than nasopharyngeal swab, presence of viral mutation(s) within the areas targeted by this assay, and inadequate number of viral copies(<138 copies/mL). A negative result must be combined with clinical observations, patient history, and epidemiological information. The expected result is Negative.  Fact Sheet for Patients:   EntrepreneurPulse.com.au  Fact Sheet for Healthcare Providers:  IncredibleEmployment.be  This test is no t yet approved or cleared by the [place] FDA and  has been authorized for detection and/or diagnosis of SARS-CoV-2 by FDA under an Emergency Use Authorization (EUA). This EUA will remain  in effect (meaning this test can be used) for the duration of the COVID-19 declaration under Section 564(b)(1) of the Act, 21 U.S.C.section 360bbb-3(b)(1), unless the authorization is terminated  or revoked sooner.       Influenza A by PCR NEGATIVE NEGATIVE Final   Influenza B by PCR NEGATIVE NEGATIVE Final    Comment: (NOTE) The Xpert Xpress SARS-CoV-2/FLU/RSV plus assay is intended as an aid in the diagnosis of influenza from Nasopharyngeal swab specimens and should not be used as a sole basis for treatment. Nasal washings and aspirates are unacceptable for Xpert Xpress SARS-CoV-2/FLU/RSV testing.  Fact Sheet for Patients: EntrepreneurPulse.com.au  Fact Sheet for Healthcare Providers: IncredibleEmployment.be  This test is not yet approved or cleared by the [place] FDA and has been authorized for detection and/or diagnosis of SARS-CoV-2 by FDA under an Emergency Use Authorization (EUA). This EUA will remain in effect (meaning this test can be used) for the duration of the COVID-19 declaration under Section 564(b)(1) of the Act, 21 U.S.C. section 360bbb-3(b)(1), unless the authorization is terminated or revoked.  Performed at [facility], [address], [place], [place] [address]   MRSA Next Gen by PCR, Nasal     Status: Abnormal   Collection Time: 07/17/21  5:55 PM   Specimen: Nasal Mucosa; Nasal Swab  Result Value Ref Range Status   MRSA by PCR Next Gen DETECTED (A) NOT DETECTED Final    Comment: RESULT CALLED TO, READ BACK BY AND VERIFIED WITH: Shaune Spittle RN [phone] K FORSYTH (NOTE) The  GeneXpert MRSA Assay (FDA approved for NASAL specimens only), is one component of a comprehensive MRSA colonization surveillance program. It is not intended to diagnose MRSA infection nor to guide or monitor treatment for MRSA infections. Test performance is not FDA approved in patients less than 44 years old. Performed at [facility], [address], [place], [place] [address]      Radiology Studies: DG CHEST PORT 1 VIEW  Result Date: 07/20/2021 CLINICAL DATA:  Shortness of breath, chest pain EXAM: PORTABLE CHEST 1 VIEW COMPARISON:  07/17/2021 FINDINGS: Cardiomegaly. Both lungs are clear. The visualized skeletal structures are unremarkable. IMPRESSION: Cardiomegaly without acute abnormality of the lungs in AP portable projection. Electronically Signed   By: Delanna Ahmadi M.D.   On: 07/20/2021 10:35    Scheduled Meds:  albuterol  2.5 mg Nebulization Q4H   amLODipine  5 mg Oral Daily   arformoterol  15 mcg Nebulization BID   budesonide (PULMICORT) nebulizer solution  0.25 mg Nebulization BID   busPIRone  7.5 mg Oral BID   Chlorhexidine Gluconate Cloth  6 each Topical Daily   cholecalciferol  5,000 Units Oral Daily   DULoxetine  60 mg Oral Daily   enoxaparin (LOVENOX) injection  60 mg Subcutaneous Daily   fluticasone  2 spray Each Nare Daily   hydrochlorothiazide  25 mg Oral Daily   hydroxychloroquine  200 mg Oral BID   insulin aspart  0-20 Units Subcutaneous TID WC   insulin aspart  0-5 Units Subcutaneous QHS   insulin aspart  12 Units Subcutaneous TID WC   insulin glargine-yfgn  30 Units Subcutaneous Daily   levETIRAcetam  500 mg Oral BID   loratadine  10 mg Oral Daily   methylPREDNISolone (SOLU-MEDROL) injection  60 mg Intravenous Q12H   montelukast  10 mg Oral QHS   mupirocin ointment  1 application Nasal BID   pantoprazole  40 mg Oral BID AC   pregabalin  100 mg Oral BID   QUEtiapine  25 mg Oral BID   revefenacin  175 mcg Nebulization Daily   Continuous Infusions:   LOS:  3 days   Time spent: 35 mins   Ettore Trebilcock Wynetta Emery, MD How to contact the [facility] Attending or Consulting provider 7A - 7P or covering provider during after hours [place], for this patient?  Check the care team in [facility] and look for a) attending/consulting TRH provider listed and b) the [facility] team listed Log into [email or web address] and use [place]'s universal password to access. If you do not have the password, please contact the hospital operator. Locate the [facility] provider you are looking for under [facility] and page to a number that you can be directly reached. If you still have difficulty reaching the provider, please page the [facility] (Director on Call) for the Hospitalists listed on amion for assistance.  07/20/2021, 11:56 AM

## 2021-07-20 NOTE — Assessment & Plan Note (Signed)
This is a long-standing issue and she is followed by a pulmonologist.

## 2021-07-20 NOTE — Progress Notes (Signed)
[name], MRN:  [id number], DOB:  07/14/1975, LOS: 3 ADMISSION DATE:  07/17/2021, CONSULTATION DATE:  07/17/21  REFERRING MD:  [facility], CHIEF COMPLAINT:  status asthmaticus vs VCD   History of Present Illness:  63 yoF with prior hx significant for severe persistent allergic asthma, former smoker, OSA on CPAP, RA on plaquenil, and upper airway cough syndrome who presented to [facility] with severe hoaseness / refractory wheeze abupt onset 07/12/21 while maint on trelegy and PCCM service asked to consult am 2/16.  She is followed in our office by Dr. Craige Cotta.   Pharmacy review done 2/17 and indicates refilling saba hfa and neb more than Trelegy though she said the opposite and that her breathing was "just fine" up until the day PTA   Pertinent  Medical History  Anemia Asthma Diabetes  Eczema Edema  HTN Rheumatoid arthritis Seizures  Sleep apnea  Former smoker- quit 2020 Laryngopharyngeal reflux   Significant Hospital Events: Including procedures, antibiotic start and stop dates in addition to other pertinent events   2/15 admitted to [facility] 2/18 resp distress/ AMS requiring intubation, tx to [facility]   Interim History / Subjective:  Worsening respiratory distress and altered mental status after lying flat at [facility] requiring intubation.  Transferred to [facility] for critical care management.   Objective   Blood pressure 133/80, pulse 62, temperature 98.4 F (36.9 C), temperature source Oral, resp. rate 16, height  (1.626 m), weight 124.2 kg, SpO2 99 %.    Vent Mode: PRVC FiO2 (%):  [40 %-100 %] 40 % Set Rate:  [18 bmp] 18 bmp Vt Set:  [440 mL] 440 mL PEEP:  [5 cmH20] 5 cmH20 Plateau Pressure:  [17 cmH20-22 cmH20] 17 cmH20   Intake/Output Summary (Last 24 hours) at 07/20/2021 1740 Last data filed at 07/20/2021 1600 Gross per 24 hour  Intake 975.53 ml  Output 1400 ml  Net -424.47 ml   Filed Weights   07/17/21 1749 07/18/21 0600 07/19/21 1624  Weight: 125.6 kg 125.4 kg 124.2 kg    Examination: Propofol at 45, received fentanyl IVP prior to arrival General:  critically ill obese adult female sedated on MV HEENT: MM pink/moist, ETT (22cm at lip)/ OGT, pupils 3/reactive Neuro:  sedated, minimally opens eyes but does not f/c or move spont CV: rr, NSR, no murmur PULM:  non- labored on MV, clear throughout, no wheezing, no secretions, plat 17, dp 12 GI: soft, bs+, ND, foley Extremities: warm/dry, no LE edema  Skin: no rashes, tattoos   Labs reviewed: normal d-dimer, BNP, trop hs neg, PCT neg, cbc w/diff wnl except for ab eosinophils elevated     Assessment & Plan:   Acute hypoxic and hypercapnic respiratory failure mostly likely in the setting of VCD and possible status asthmaticus.   Hx severe persistent allergic asthma  Hx OSA on CPAP  - noted to have insp and exp wheezing prior to intubation which could be transmitted sounds from VCD.  Unclear post intubation if she had wheezing or if resolved.  Currently she has no wheezing, volumes and pressures on MV are at goal.  Considered component of OHS, however her bicarb trend does not support this. - previously supposed to be on nucala, unclear if she has restarted this P:  - Continue MV support, 8cc/kg IBW with goal Pplat <30 and DP<15  - VAP prevention protocol/ PPI - PAD protocol for sedation> propofol/ fentanyl gtt  - wean FiO2 as able for SpO2 >92%  - daily SAT &  SBT starting in am  - CXR and ABG now - hold further solumedrol for now - continue with albuterol, yupelri, brovanna and budesonide nebs  - continue Claritin and singular  - low suspicon of PE given normal d-dimer/ BNP/ trop hs  HFpEF (POA) HTN - 01/24/20 TTE LVEF 55-60%, G1DD, normal RV and PASP - s/p lasix at [facility] 2/18.   CXR without pulmonary edema  - cont amlodipine/ HCTZ - monitor volume status / renal indices/ UOP/ I/Os  Rheumatoid arthritis - cont home plaquenil   Seizures - cont home keppra 500mg  BID  GERD - PPI   DMT2 -  exacerbated by steroids - NPO now so will go to SSI moderate, hold semglee.  May need to adjust.   Obesity- BMI 47 - plans for bariatric surgery at [facility]   Best Practice (right click and "Reselect all SmartList Selections" daily)   Diet/type: NPO, if not extubated 2/18, start TF DVT prophylaxis: LMWH GI prophylaxis: PPI Lines: N/A Foley:  Yes, and it is still needed Code Status:  full code Last date of multidisciplinary goals of care discussion [pending]  Labs   CBC: Recent Labs  Lab 07/17/21 0712  WBC 6.9  NEUTROABS 4.3  HGB 12.6  HCT 40.1  MCV 83.9  PLT 214    Basic Metabolic Panel: Recent Labs  Lab 07/17/21 0712 07/18/21 0346  NA 141 137  K 3.7 4.3  CL 108 108  CO2 21* 19*  GLUCOSE 127* 196*  BUN 8 10  CREATININE 0.61 0.64  CALCIUM 8.8* 9.0  MG  --  2.0   GFR: Estimated Creatinine Clearance: 113.2 mL/min (by C-G formula based on SCr of 0.64 mg/dL). Recent Labs  Lab 07/17/21 0712 07/18/21 0346  PROCALCITON <0.10 <0.10  WBC 6.9  --     Liver Function Tests: Recent Labs  Lab 07/17/21 0712  AST 35  ALT 38  ALKPHOS 101  BILITOT 0.2*  PROT 7.0  ALBUMIN 3.6   No results for input(s): LIPASE, AMYLASE in the last 168 hours. No results for input(s): AMMONIA in the last 168 hours.  ABG    Component Value Date/Time   PHART 7.52 (H) 07/20/2021 1448   PCO2ART 43 07/20/2021 1448   PO2ART 186 (H) 07/20/2021 1448   HCO3 35.1 (H) 07/20/2021 1448   ACIDBASEDEF 9.8 (H) 10/18/2019 2100   O2SAT 100 07/20/2021 1448     Coagulation Profile: No results for input(s): INR, PROTIME in the last 168 hours.  Cardiac Enzymes: No results for input(s): CKTOTAL, CKMB, CKMBINDEX, TROPONINI in the last 168 hours.  HbA1C: Hemoglobin A1C  Date/Time Value Ref Range Status  04/15/2021 12:00 AM 7.1  Final   Hgb A1c MFr Bld  Date/Time Value Ref Range Status  07/17/2021 07:12 AM 5.8 (H) 4.8 - 5.6 % Final    Comment:    (NOTE) Pre diabetes:           5.7%-6.4%  Diabetes:              >6.4%  Glycemic control for   <7.0% adults with diabetes   09/27/2020 12:42 PM 7.0 (H) 4.8 - 5.6 % Final    Comment:    (NOTE) Pre diabetes:          5.7%-6.4%  Diabetes:              >6.4%  Glycemic control for   <7.0% adults with diabetes     CBG: Recent Labs  Lab 07/20/21 0256 07/20/21 0731 07/20/21  1117 07/20/21 1233 07/20/21 1717  GLUCAP 157* 149* 149* 124* 110*   CCT: 40 mins    Posey Boyer, ACNP [facility] 07/20/2021, 5:40 PM  See Amion for pager If no response to pager, please call PCCM consult pager After 7:00 pm call Elink

## 2021-07-20 NOTE — Progress Notes (Signed)
Called to room for pt c/o chest pain. Family member states,  "She said she felt funny and then her eyes rolled back in her head." Pt with eyes closed, will respond to questions and commands appropriately. Pt states she feels weak. Pt with upper airway insp and exp wheezes, no cough. SaO2 93% on room air. MD Laural BenesJohnson notified of pt's c/o.  EKG obtained, pt placed on 4 lpm [place] while lying flat during EKG. EKG completed, shows NSR 80's. MD Laural BenesJohnson notified that EKG completed. Pt remains awake, following commands, continues with insp and exp[ wheezes upper airway. SaO2 97% on room air. Skin warm and dry. States feels like a weight on her chest. MD Laural BenesJohnson updated on current c/o and VSS. Awaiting response.

## 2021-07-20 NOTE — Progress Notes (Addendum)
Rapid Response Note  Pt was on telemetry unit with sister and had been doing fairly well but then after having some patient personal care she was flat on back for a brief time and after that began to have more work of breathing.  Sister called out to RN that she noticed patient more confused and lethargic and patient reported heaviness on chest. We ordered CXR and EKG which showed no acute changes. Pt had increased work of breathing.  I asked for an ABG.  I came to see her and found her to be tachypneic and somnolent.  She appeared to be tiring out.  D dimer and troponins ordered. I spoke with her sister from [place] who was at bedside and told her that we were going to intubate patient due to increasing respiratory distress.  We moved back to stepdown ICU and called for intubation.  ED provider and RT came and successfully intubated patient.  Will call to critical care team at [facility] due to no critical care available at [facility] on weekends.  I called and spoke with Dr. Caprice Red with PCCM at [facility] and discussed case and she accepted patient to [facility]. Discussed with Tim, [facility] on today and reported bed should be available at [facility].  I updated sister and son who were present at bedside.    Critical Care Procedure Note Authorized and Performed by: Maryln Manuel MD  Total Critical Care time:  70 mins Due to a high probability of clinically significant, life threatening deterioration, the patient required my highest level of preparedness to intervene emergently and I personally spent this critical care time directly and personally managing the patient.  This critical care time included obtaining a history; examining the patient, pulse oximetry; ordering and review of studies; arranging urgent treatment with development of a management plan; evaluation of patient's response of treatment; frequent reassessment; and discussions with other providers.  This critical care time was performed to assess and manage the high probability  of imminent and life threatening deterioration that could result in multi-organ failure.  It was exclusive of separately billable procedures and treating other patients and teaching time.

## 2021-07-20 NOTE — Progress Notes (Signed)
Dr Laural Benes placed transfer orders to move patient to step down at 1259.patient and patients family member at bedside notified of transfer. Patient transferred at 1320 from 300 to stepdown. Receiving ICU nurse Lowanda Foster Foley RN) took report at bedside.

## 2021-07-20 NOTE — ED Provider Notes (Signed)
Department of Emergency Medicine     CONSULT NOTE  Chief Complaint: respiratory distress  Level V Caveat: severe respiratory distress  History of present illness: I was contacted by the hospital for acute respiratory failure of patient.  Discussed with hospitalist at bedside.  Patient with severe asthma exacerbation.  Increased work of breathing, showing evidence of exhaustion.  Mental status deteriorating.  Recommend emergent intubation.  ROS: Unable to obtain, Level V caveat  Scheduled Meds:  albuterol  2.5 mg Nebulization Q4H   arformoterol  15 mcg Nebulization BID   budesonide (PULMICORT) nebulizer solution  0.25 mg Nebulization BID   busPIRone  7.5 mg Per NG tube BID   chlorhexidine gluconate (MEDLINE KIT)  15 mL Mouth Rinse BID   Chlorhexidine Gluconate Cloth  6 each Topical Daily   cholecalciferol  5,000 Units Oral Daily   docusate  100 mg Per Tube BID   DULoxetine  60 mg Oral Daily   enoxaparin (LOVENOX) injection  60 mg Subcutaneous Daily   fluticasone  2 spray Each Nare Daily   hydroxychloroquine  200 mg Per Tube BID   insulin aspart  0-20 Units Subcutaneous TID WC   insulin aspart  0-5 Units Subcutaneous QHS   insulin glargine-yfgn  30 Units Subcutaneous Daily   [START ON 07/21/2021] loratadine  10 mg Per NG tube Daily   mouth rinse  15 mL Mouth Rinse 10 times per day   methylPREDNISolone (SOLU-MEDROL) injection  60 mg Intravenous Q12H   montelukast  10 mg Per Tube QHS   mupirocin ointment  1 application Nasal BID   pantoprazole sodium  40 mg Per Tube Q12H   polyethylene glycol  17 g Per Tube Daily   revefenacin  175 mcg Nebulization Daily   Continuous Infusions:  levETIRAcetam     propofol (DIPRIVAN) infusion 45 mcg/kg/min (07/20/21 1514)   PRN Meds:.acetaminophen **OR** acetaminophen, albuterol, bisacodyl, fentaNYL (SUBLIMAZE) injection, fentaNYL (SUBLIMAZE) injection, ondansetron **OR** ondansetron (ZOFRAN) IV Past Medical History:  Diagnosis Date    Anemia    Asthma    COVID-19 virus infection 05/16/2020   04/27/20 dx covid-19, not hospitalized   Diabetes ([place])    on Metformin   Eczema    Edema    Heart problem    "something with the arteries on the left side of the heart"; upcoming appt with cardiology for evaluation   HTN (hypertension)    Rheumatoid arthritis ([place])    on Plaquenil    Seizures ([place])    last seizure 2019   Sleep apnea    Sleep apnea    Past Surgical History:  Procedure Laterality Date   APPENDECTOMY     CESAREAN SECTION     x3   Social History   Socioeconomic History   Marital status: Single    Spouse name: Not on file   Number of children: 5   Years of education: Not on file   Highest education level: 9th grade  Occupational History   Not on file  Tobacco Use   Smoking status: Former    Years: 10.00    Types: Cigarettes    Quit date: 06/02/2013    Years since quitting: 8.1   Smokeless tobacco: Never   Tobacco comments:    during the 10 years of smoking, smoked 2-3 cigarettes/day  Vaping Use   Vaping Use: Never used  Substance and Sexual Activity   Alcohol use: Yes    Comment: occ   Drug use: Not Currently  Sexual activity: Not Currently    Birth control/protection: Abstinence, Surgical    Comment: tubal  Other Topics Concern   Not on file  Social History Narrative   Divorced.Lives with 3 kids.Originally from [place].Came from [place] 7 months ago.      02/27/2020   Right handed   Caffeine: none    Social Determinants of Health   Financial Resource Strain: Not on file  Food Insecurity: Not on file  Transportation Needs: Not on file  Physical Activity: Not on file  Stress: Not on file  Social Connections: Not on file  Intimate Partner Violence: Not on file   Allergies  Allergen Reactions   Ferumoxytol Anaphylaxis    ~5 years ago. Tolerated IV venofer   Phenytoin Anaphylaxis    Other reaction(s): swelling and heart rate decreased   Pecan Nut (Diagnostic) Other (See  Comments)   Phenylbutazones Other (See Comments)    Last set of Vital Signs (not current) Vitals:   07/20/21 1548 07/20/21 1600  BP:  133/80  Pulse: 68 69  Resp: 18 18  Temp:    SpO2: 100% 99%      Physical Exam  Gen: Reduced responsiveness, obese Cardiovascular: Tachycardic Resp: Diffuse wheezing bilaterally.  Accessory muscle use, increased work of breathing, decreased breath sounds b/l Abd: nondistended  Neuro: GCS 14, patient appears sleepy HEENT: Tongue appears enlarged.  No lip swelling.  Reduced neck movement. Neck: No crepitus  Musculoskeletal: No deformity  Skin: warm   Procedure Name: Intubation Date/Time: 07/20/2021 1:30 PM Performed by: Jeanell Sparrow, DO Pre-anesthesia Checklist: Patient identified, Patient being monitored, Emergency Drugs available, Timeout performed and Suction available Oxygen Delivery Method: Ambu bag Preoxygenation: Pre-oxygenation with 100% oxygen Induction Type: Rapid sequence Ventilation: Mask ventilation without difficulty Laryngoscope Size: Mac and 3 Grade View: Grade II Tube size: 7.5 mm Number of attempts: 1 Placement Confirmation: ETT inserted through vocal cords under direct vision, CO2 detector and Breath sounds checked- equal and bilateral Secured at: 24 cm Tube secured with: ETT holder Dental Injury: Teeth and Oropharynx as per pre-operative assessment  Difficulty Due To: Difficulty was anticipated, Difficult Airway- due to large tongue and Difficult Airway- due to reduced neck mobility Future Recommendations: Recommend- induction with short-acting agent, and alternative techniques readily available        Medical Decision making  Patient with acute respiratory failure likely secondary to asthma exacerbation.  Patient required intubation.  Intubation completed successfully, see procedure note.  RT at bedside.  Assessment and Plan  Recommend transfer to ICU. D/w hospitalist at bedside Dr Wynetta Emery.     Wynona Dove  A, DO 07/20/21 1608

## 2021-07-20 NOTE — Progress Notes (Signed)
05:36 Patient experienced increased SOB when placed flat for hygiene. Patient placed on Supplemental O2 at 4 LPM and given scheduled solumedrol. 06:23 oxygen decreased back to room air. Patient comfortable at rest with HOB elevated.. Remains Yellow MEWS 2 for tachypnea.

## 2021-07-20 NOTE — Progress Notes (Signed)
100 mcg of Fentanyl PRN push pulled and given to [employer] for the patient on the ride to [employer]. 50 mcg given here at [facility], documented, attending [facility] RN has other 50 mcg.

## 2021-07-20 NOTE — Progress Notes (Signed)
[facility] present to transport patient to [facility]. Report called and given to Nate, RN on 2-M.

## 2021-07-20 NOTE — Assessment & Plan Note (Signed)
Pt complaining of worsening dyspnea when lying flat.   Update TTE.  IV lasix x 1 dose given.  CXR shows no pulm edema.

## 2021-07-21 ENCOUNTER — Inpatient Hospital Stay (HOSPITAL_COMMUNITY): Payer: Medicaid Other

## 2021-07-21 DIAGNOSIS — R0609 Other forms of dyspnea: Secondary | ICD-10-CM | POA: Diagnosis not present

## 2021-07-21 DIAGNOSIS — Z9911 Dependence on respirator [ventilator] status: Secondary | ICD-10-CM | POA: Diagnosis not present

## 2021-07-21 DIAGNOSIS — J4551 Severe persistent asthma with (acute) exacerbation: Secondary | ICD-10-CM | POA: Diagnosis not present

## 2021-07-21 LAB — GLUCOSE, CAPILLARY
Glucose-Capillary: 146 mg/dL — ABNORMAL HIGH (ref 70–99)
Glucose-Capillary: 124 mg/dL — ABNORMAL HIGH (ref 70–99)
Glucose-Capillary: 121 mg/dL — ABNORMAL HIGH (ref 70–99)
Glucose-Capillary: 142 mg/dL — ABNORMAL HIGH (ref 70–99)
Glucose-Capillary: 134 mg/dL — ABNORMAL HIGH (ref 70–99)
Glucose-Capillary: 180 mg/dL — ABNORMAL HIGH (ref 70–99)

## 2021-07-21 LAB — ECHOCARDIOGRAM COMPLETE
AR max vel: 3.07 cm2
AV Area VTI: 3 cm2
AV Area mean vel: 3.01 cm2
AV Mean grad: 5 mmHg
AV Peak grad: 9.1 mmHg
Ao pk vel: 1.51 m/s
Height: 64 in
S' Lateral: 3.1 cm
Weight: 4380.98 oz

## 2021-07-21 LAB — CBC
HCT: 40.9 % (ref 36.0–46.0)
Hemoglobin: 12.5 g/dL (ref 12.0–15.0)
MCH: 25.6 pg — ABNORMAL LOW (ref 26.0–34.0)
MCHC: 30.6 g/dL (ref 30.0–36.0)
MCV: 83.8 fL (ref 80.0–100.0)
Platelets: 259 10*3/uL (ref 150–400)
RBC: 4.88 MIL/uL (ref 3.87–5.11)
RDW: 18.9 % — ABNORMAL HIGH (ref 11.5–15.5)
WBC: 12.4 10*3/uL — ABNORMAL HIGH (ref 4.0–10.5)
nRBC: 0 % (ref 0.0–0.2)

## 2021-07-21 LAB — BASIC METABOLIC PANEL
Anion gap: 11 (ref 5–15)
BUN: 14 mg/dL (ref 6–20)
CO2: 27 mmol/L (ref 22–32)
Calcium: 8.4 mg/dL — ABNORMAL LOW (ref 8.9–10.3)
Chloride: 95 mmol/L — ABNORMAL LOW (ref 98–111)
Creatinine, Ser: 0.78 mg/dL (ref 0.44–1.00)
GFR, Estimated: >60 mL/min (ref >60)
Glucose, Bld: 155 mg/dL — ABNORMAL HIGH (ref 70–99)
Potassium: 3.9 mmol/L (ref 3.5–5.1)
Sodium: 133 mmol/L — ABNORMAL LOW (ref 135–145)

## 2021-07-21 LAB — TRIGLYCERIDES: Triglycerides: 94 mg/dL (ref <150)

## 2021-07-21 LAB — MAGNESIUM: Magnesium: 2.4 mg/dL (ref 1.7–2.4)

## 2021-07-21 IMAGING — DX DG CHEST 1V PORT
1 series · 1 of 1 positions shown · non-contrast
Comparison: [DATE]

CLINICAL DATA: ET tube present

EXAM:
PORTABLE CHEST 1 VIEW

[chest]
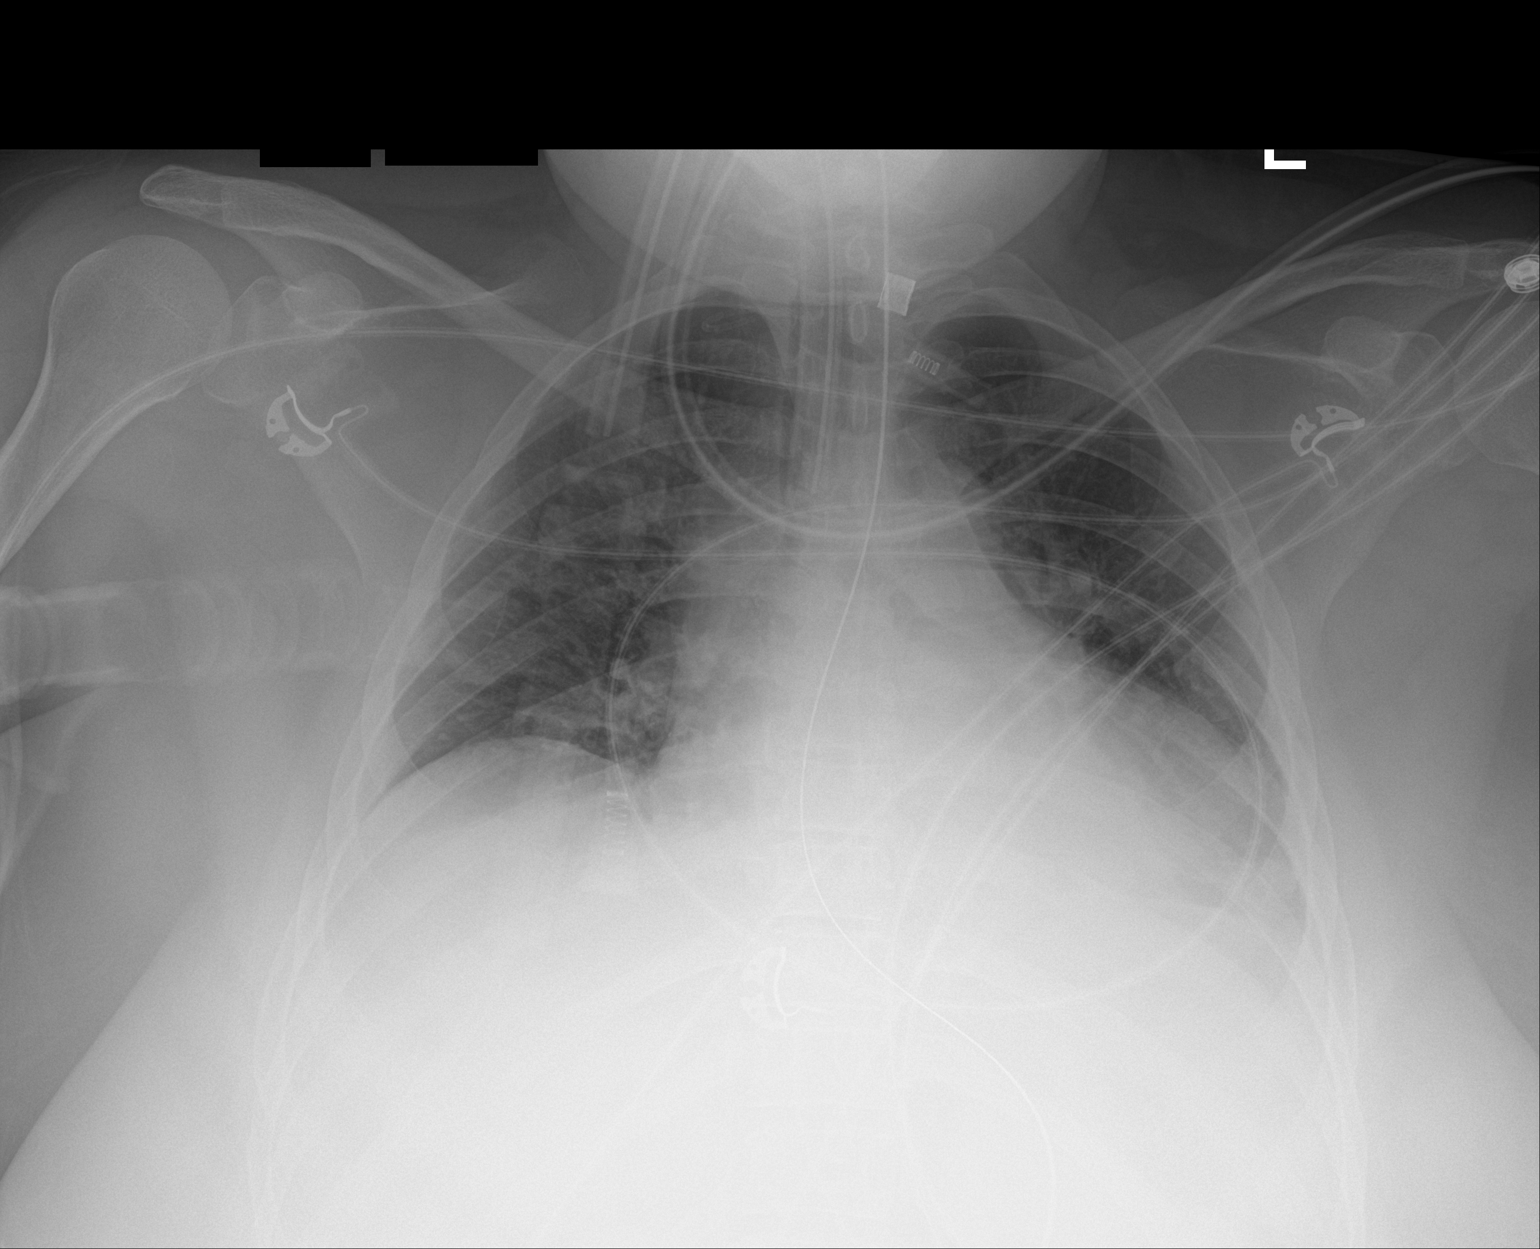

[1 of 1 positions shown; findings below may reference images not displayed]

FINDINGS: Endotracheal tube overlies the trachea proximally 2.0 cm above the
carina. Low lung volumes. Bibasilar airspace opacities left greater
than right, not significant changed from prior. Probable trace left
effusion. No visible pneumothorax. No acute osseous abnormality.
IMPRESSION: Endotracheal tube tip is approximately 2.0 cm above the carina.

Bibasilar airspace opacities, left greater than right, not
significantly changed from prior exam.

## 2021-07-21 MED ORDER — FUROSEMIDE 10 MG/ML IJ SOLN
40.0000 mg | Freq: Two times a day (BID) | INTRAMUSCULAR | Status: DC
  Administered 2021-07-21 (×2): 40 mg via INTRAVENOUS
  Filled 2021-07-21 (×3): qty 4

## 2021-07-21 MED ORDER — VANCOMYCIN HCL IN DEXTROSE 1-5 GM/200ML-% IV SOLN
1000.0000 mg | Freq: Two times a day (BID) | INTRAVENOUS | Status: DC
  Administered 2021-07-21 – 2021-07-23 (×4): 1000 mg via INTRAVENOUS
  Filled 2021-07-21 (×4): qty 200

## 2021-07-21 MED ORDER — VANCOMYCIN HCL 2000 MG/400ML IV SOLN
2000.0000 mg | Freq: Once | INTRAVENOUS | Status: AC
  Administered 2021-07-21: 2000 mg via INTRAVENOUS
  Filled 2021-07-21: qty 400

## 2021-07-21 MED ORDER — ACETAMINOPHEN 650 MG RE SUPP: 650.0000 mg | Freq: Four times a day (QID) | RECTAL | Status: DC | PRN

## 2021-07-21 MED ORDER — METHYLPREDNISOLONE SODIUM SUCC 40 MG IJ SOLR
20.0000 mg | INTRAMUSCULAR | Status: AC
  Administered 2021-07-21 – 2021-07-22 (×4): 20 mg via INTRAVENOUS
  Filled 2021-07-21 (×4): qty 0.5

## 2021-07-21 MED ORDER — HYDRALAZINE HCL 20 MG/ML IJ SOLN: 10.0000 mg | Freq: Four times a day (QID) | INTRAMUSCULAR | Status: DC | PRN

## 2021-07-21 MED ORDER — ACETAMINOPHEN 325 MG PO TABS: 650.0000 mg | ORAL_TABLET | Freq: Four times a day (QID) | ORAL | Status: DC | PRN

## 2021-07-21 MED ORDER — VANCOMYCIN HCL 1000 MG/200ML IV SOLN: 1000.0000 mg | Freq: Two times a day (BID) | INTRAVENOUS | Status: DC

## 2021-07-21 MED ORDER — METHYLPREDNISOLONE SODIUM SUCC 125 MG IJ SOLR
INTRAMUSCULAR | Status: AC
  Filled 2021-07-21: qty 2

## 2021-07-21 MED ORDER — VITAMIN D 25 MCG (1000 UNIT) PO TABS
5000.0000 [IU] | ORAL_TABLET | Freq: Every day | ORAL | Status: DC
  Administered 2021-07-21 – 2021-07-22 (×2): 5000 [IU]
  Filled 2021-07-21: qty 5

## 2021-07-21 MED ORDER — SODIUM CHLORIDE 0.9 % IV SOLN
2.0000 g | Freq: Three times a day (TID) | INTRAVENOUS | Status: DC
  Administered 2021-07-21 – 2021-07-23 (×7): 2 g via INTRAVENOUS
  Filled 2021-07-21 (×7): qty 2

## 2021-07-21 NOTE — Procedures (Deleted)
Extubation Procedure Note  Patient Details:   Name: Elizabeth Bautista DOB: 05/13/1976 MRN: [id number]   Airway Documentation:    Vent end date: 07/21/21 Vent end time: 1545   Evaluation  O2 sats:  terminal extubation Complications: No apparent complications Patient terminal extubation tolerate procedure well. Bilateral Breath Sounds: Rhonchi, Diminished   No  Pt was terminally extubated per CCM order.   Merlene Laughteratalie  Blayne Garlick 07/21/2021, 3:46 PM

## 2021-07-21 NOTE — Progress Notes (Signed)
Pharmacy Antibiotic Note  Elizabeth Bautista is a 46 y.o. female admitted on 07/17/2021 with pneumonia.  Pharmacy has been consulted for Vancomycin/cefepime dosing.  Scr 0.78 No previous culture data MRSA nares +  Plan: Vancomycin 2000mg  LD x1 followed by vancomycin 1000mg  q12hr (eAUC 472) Cefepime 2gm q8hr Plan to obtain levels at steady state if therapy continued Will monitor for acute changes in renal function and adjust as needed F/u cultures results and de-escalate as appropriate   Height: 5\' 4"  (162.6 cm) Weight: 124.2 kg (273 lb 13 oz) IBW/kg (Calculated) : 54.7  Temp (24hrs), Avg:98.6 F (37 C), Min:98.1 F (36.7 C), Max:99.1 F (37.3 C)  Recent Labs  Lab 07/17/21 0712 07/18/21 0346 07/20/21 1903 07/21/21 0211  WBC 6.9  --   --  12.4*  CREATININE 0.61 0.64 0.78 0.78    Estimated Creatinine Clearance: 113.2 mL/min (by C-G formula based on SCr of 0.78 mg/dL).    Allergies  Allergen Reactions   Ferumoxytol Anaphylaxis    ~5 years ago. Tolerated IV venofer   Phenytoin Anaphylaxis    Other reaction(s): swelling and heart rate decreased   Pecan Nut (Diagnostic) Other (See Comments)   Phenylbutazones Other (See Comments)    Thank you for allowing pharmacy to be a part of this patients care.  Tomie China, PharmD Clinical Pharmacist  Please check AMION for all [facility] Pharmacy numbers After 10:00 PM, call Main Pharmacy [phone]

## 2021-07-21 NOTE — Progress Notes (Signed)
°  Echocardiogram 2D Echocardiogram has been performed.  Elizabeth Bautista 07/21/2021, 8:45 AM

## 2021-07-21 NOTE — Progress Notes (Signed)
NAMEJerelene Bautista, MRN:  [id number], DOB:  June 08, 1975, LOS: 4 ADMISSION DATE:  07/17/2021, CONSULTATION DATE:  07/17/21  REFERRING MD:  [facility], CHIEF COMPLAINT:  status asthmaticus vs VCD   History of Present Illness:  38 yoF with prior hx significant for severe persistent allergic asthma, former smoker, OSA on CPAP, RA on plaquenil, and upper airway cough syndrome who presented to [facility] with severe hoaseness / refractory wheeze abupt onset 07/12/21 while maint on trelegy and PCCM service asked to consult am 2/16.  She is followed in our office by Dr. Halford Chessman.   Pharmacy review done 2/17 and indicates refilling saba hfa and neb more than Trelegy though she said the opposite and that her breathing was "just fine" up until the day PTA   Pertinent  Medical History  Anemia Asthma Diabetes  Eczema Edema  HTN Rheumatoid arthritis Seizures  Sleep apnea  Former smoker- quit 2020 Laryngopharyngeal reflux   Significant Hospital Events: Including procedures, antibiotic start and stop dates in addition to other pertinent events   2/15 admitted to [facility] - Cl[facility] 2/18 resp distress/ AMS requiring intubation, tx to [facility]   Interim History / Subjective:  Overnight no acute events. Some wheezing today. Sister at bedside corroborated history from yesterday's events-- she was triggered by laying flat on her back while on CPAP and feeling SOB, and the second time she was repositioning herself in bed. She was laying with her eyes closed but was able to talk and answer questions after this before she was intubated. Her sister reports she does not normally sleep on her back and she had been told her sister needed "vocal cord surgery".  Objective   Blood pressure (!) 152/75, pulse 68, temperature 99.1 F (37.3 C), temperature source Axillary, resp. rate 18, height 5\' 4"  (1.626 m), weight 124.2 kg, SpO2 95 %.    Vent Mode: PSV;CPAP FiO2 (%):  [40 %-100 %] 40 % Set Rate:  [18 bmp] 18 bmp Vt Set:  [440 mL] 440 mL PEEP:   [5 cmH20] 5 cmH20 Pressure Support:  [5 cmH20] 5 cmH20 Plateau Pressure:  [11 cmH20-22 cmH20] 13 cmH20   Intake/Output Summary (Last 24 hours) at 07/21/2021 [id number] Last data filed at 07/21/2021 0600 Gross per 24 hour  Intake 686.56 ml  Output 1750 ml  Net -1063.44 ml    Filed Weights   07/17/21 1749 07/18/21 0600 07/19/21 1624  Weight: 125.6 kg 125.4 kg 124.2 kg   Examination: General:  critically ill appearing woman lying in bed in NAD HEENT: [place]/AT, eyes anicteric, ETT in place Neuro:  RASS -4, localizes to ETT, strong cough CV: S1S2, RRR PULM: rhonchi on the R cleared with suctioning, faint expiratory wheezing on the left, no obstruction on vent waveforms. GI: obese, soft, NT Extremities: warm, dry, no significant peripheral edema Skin: warm, dry, no diffuse rashes   Na+ 133 BUN 14 Cr 0.78 WBC 12.4 H.H 12.5/40.9 CXR personally reviewed>  low lung volumes, possible infiltrate in LLL  Assessment & Plan:   Acute hypoxic and hypercapnic respiratory failure mostly likely in the setting of VCD, asthma. Worry about possible pulmonary edema with orthopnea component. Pneumonia remains possible. Hx severe persistent allergic asthma  Hx OSA on CPAP  obesity -Noted to have insp and exp wheezing prior to intubation which could be transmitted sounds from VCD.  Unclear post intubation if she had wheezing or if resolved.  Vent remains without obstructed waveforms and she has minimal wheezing on exam, although more than last night's exam.  OHS is possible, but she does not appear to have chronic hypercapnia to support this. -LTVV, 4-8cc/kg IBW with goal Pplat<30 and DP<15 -VAP prevention protocol -trach aspirate, MRSA nare swab -PAD protocol for sedation -daily SAT & SBT -Cuff leak test> failed today unfortunately. Steroids Q4h tonight and tomorrow before hopeful extubation tomorrow morning. -Starting empiric HAP antibiotics  -Reviewed chart to get more information regarding possible VC  pathology her sister is reporting-- no records available that she has seen ENT in [facility] records or care everywhere. -con't aggressive bronchodilators-- brovana, yupelri, pulmicort -con't steroids -con't claritin and singulair -lasix x 2 doses today -needs nocturnal CPAP after extubation -Long term goal for weight loss is important for optimizing GERD, asthma control, and OSA.  HFpEF (POA) HTN -lasix x 2 doses today -not currently needing antihypertensives; adding hydralazine PRN  Hyponatremia, possibly hypervolemic -diuresis -monitor  Rheumatoid arthritis -con't PTA plaquenil  History of seizures -con't PTA keppra  GERD -PPI  DMT2, A1c 5.8. Controlled hyperglycemia. -SSI PRN -can add long-acting if needed -goal BG 140-180 while in ICU  Obesity- BMI 47 -plans for bariatric surgery at [facility] (right click and "Reselect all SmartList Selections" daily)   Diet/type: NPO DVT prophylaxis: LMWH GI prophylaxis: PPI Lines: N/A Foley:  Yes, and it is still needed Code Status:  full code Last date of multidisciplinary goals of care discussion [ sister updated at bedside 2/19]  Labs   CBC: Recent Labs  Lab 07/17/21 0712 07/20/21 1743 07/21/21 0211  WBC 6.9  --  12.4*  NEUTROABS 4.3  --   --   HGB 12.6 13.9 12.5  HCT 40.1 41.0 40.9  MCV 83.9  --  83.8  PLT 214  --  259     Basic Metabolic Panel: Recent Labs  Lab 07/17/21 0712 07/18/21 0346 07/20/21 1743 07/20/21 1903 07/21/21 0211  NA 141 137 135 137 133*  K 3.7 4.3 3.3* 3.4* 3.9  CL 108 108  --  97* 95*  CO2 21* 19*  --  28 27  GLUCOSE 127* 196*  --  103* 155*  BUN 8 10  --  14 14  CREATININE 0.61 0.64  --  0.78 0.78  CALCIUM 8.8* 9.0  --  8.6* 8.4*  MG  --  2.0  --  2.2 2.4  PHOS  --   --   --  5.6*  --      This patient is critically ill with multiple organ system failure which requires frequent high complexity decision making, assessment, support, evaluation, and titration of  therapies. This was completed through the application of advanced monitoring technologies and extensive interpretation of multiple databases. During this encounter critical care time was devoted to patient care services described in this note for 38 minutes.  Julian Hy, DO 07/21/21 8:51 AM [facility]

## 2021-07-21 NOTE — Progress Notes (Signed)
Cuff leak performed by RT. No cuff leak noted or decrease in tidal volume. RN at bedside to witness. No complications noted with cuff leak check.

## 2021-07-22 ENCOUNTER — Telehealth: Payer: Self-pay | Admitting: Internal Medicine

## 2021-07-22 DIAGNOSIS — J4551 Severe persistent asthma with (acute) exacerbation: Secondary | ICD-10-CM | POA: Diagnosis not present

## 2021-07-22 LAB — CBC WITH DIFFERENTIAL/PLATELET
Abs Immature Granulocytes: 0.24 10*3/uL — ABNORMAL HIGH (ref 0.00–0.07)
Basophils Absolute: 0 10*3/uL (ref 0.0–0.1)
Basophils Relative: 0 %
Eosinophils Absolute: 0 10*3/uL (ref 0.0–0.5)
Eosinophils Relative: 0 %
HCT: 40.3 % (ref 36.0–46.0)
Hemoglobin: 12.8 g/dL (ref 12.0–15.0)
Immature Granulocytes: 2 %
Lymphocytes Relative: 8 %
Lymphs Abs: 1.2 10*3/uL (ref 0.7–4.0)
MCH: 26.7 pg (ref 26.0–34.0)
MCHC: 31.8 g/dL (ref 30.0–36.0)
MCV: 84.1 fL (ref 80.0–100.0)
Monocytes Absolute: 0.9 10*3/uL (ref 0.1–1.0)
Monocytes Relative: 6 %
Neutro Abs: 12.4 10*3/uL — ABNORMAL HIGH (ref 1.7–7.7)
Neutrophils Relative %: 84 %
Platelets: 268 10*3/uL (ref 150–400)
RBC: 4.79 MIL/uL (ref 3.87–5.11)
RDW: 17.7 % — ABNORMAL HIGH (ref 11.5–15.5)
WBC: 14.7 10*3/uL — ABNORMAL HIGH (ref 4.0–10.5)
nRBC: 0 % (ref 0.0–0.2)

## 2021-07-22 LAB — BASIC METABOLIC PANEL
Anion gap: 10 (ref 5–15)
BUN: 22 mg/dL — ABNORMAL HIGH (ref 6–20)
CO2: 30 mmol/L (ref 22–32)
Calcium: 8.6 mg/dL — ABNORMAL LOW (ref 8.9–10.3)
Chloride: 96 mmol/L — ABNORMAL LOW (ref 98–111)
Creatinine, Ser: 0.9 mg/dL (ref 0.44–1.00)
GFR, Estimated: >60 mL/min (ref >60)
Glucose, Bld: 152 mg/dL — ABNORMAL HIGH (ref 70–99)
Potassium: 4.1 mmol/L (ref 3.5–5.1)
Sodium: 136 mmol/L (ref 135–145)

## 2021-07-22 LAB — GLUCOSE, CAPILLARY
Glucose-Capillary: 138 mg/dL — ABNORMAL HIGH (ref 70–99)
Glucose-Capillary: 133 mg/dL — ABNORMAL HIGH (ref 70–99)
Glucose-Capillary: 169 mg/dL — ABNORMAL HIGH (ref 70–99)
Glucose-Capillary: 105 mg/dL — ABNORMAL HIGH (ref 70–99)
Glucose-Capillary: 132 mg/dL — ABNORMAL HIGH (ref 70–99)
Glucose-Capillary: 132 mg/dL — ABNORMAL HIGH (ref 70–99)

## 2021-07-22 LAB — MAGNESIUM: Magnesium: 2.8 mg/dL — ABNORMAL HIGH (ref 1.7–2.4)

## 2021-07-22 MED ORDER — PANTOPRAZOLE 2 MG/ML SUSPENSION
40.0000 mg | Freq: Every day | ORAL | Status: DC
  Filled 2021-07-22: qty 20

## 2021-07-22 MED ORDER — ORAL CARE MOUTH RINSE
15.0000 mL | Freq: Two times a day (BID) | OROMUCOSAL | Status: DC
  Administered 2021-07-23 – 2021-07-24 (×4): 15 mL via OROMUCOSAL

## 2021-07-22 MED ORDER — FUROSEMIDE 10 MG/ML IJ SOLN
40.0000 mg | Freq: Every day | INTRAMUSCULAR | Status: DC
  Administered 2021-07-22 – 2021-07-25 (×4): 40 mg via INTRAVENOUS
  Filled 2021-07-22 (×4): qty 4

## 2021-07-22 MED ORDER — LEVETIRACETAM 100 MG/ML PO SOLN
500.0000 mg | Freq: Two times a day (BID) | ORAL | Status: DC
  Administered 2021-07-22 (×2): 500 mg
  Filled 2021-07-22 (×2): qty 5

## 2021-07-22 MED ORDER — WHITE PETROLATUM EX OINT
TOPICAL_OINTMENT | CUTANEOUS | Status: DC | PRN
  Filled 2021-07-22 (×3): qty 28.35

## 2021-07-22 MED ORDER — SENNOSIDES-DOCUSATE SODIUM 8.6-50 MG PO TABS
1.0000 | ORAL_TABLET | Freq: Two times a day (BID) | ORAL | Status: DC
  Administered 2021-07-22: 1
  Filled 2021-07-22: qty 1

## 2021-07-22 NOTE — Progress Notes (Signed)
Transfer order placed. [facility] to take over as primary at 7 am on 2/21.

## 2021-07-22 NOTE — Progress Notes (Addendum)
NAMLegrand Como:  Elizabeth Bautista, MRN:  [id number], DOB:  02/15/1976, LOS: 5 ADMISSION DATE:  07/17/2021, CONSULTATION DATE:  07/17/21  REFERRING MD:  [facility], CHIEF COMPLAINT:  status asthmaticus vs VCD   History of Present Illness:  [age] yoF with prior hx significant for severe persistent allergic asthma, former smoker, OSA on CPAP, RA on plaquenil, and upper airway cough syndrome who presented to [facility] with severe hoaseness / refractory wheeze abupt onset 07/12/21 while maint on trelegy and PCCM service asked to consult am 2/16.  She is followed in our office by Dr. Craige CottaSood.   Pharmacy review done 2/17 and indicates refilling saba hfa and neb more than Trelegy though she said the opposite and that her breathing was "just fine" up until the day PTA   Pertinent  Medical History  Anemia Asthma Diabetes  Eczema Edema  HTN Rheumatoid arthritis Seizures  Sleep apnea  Former smoker- quit 2020 Laryngopharyngeal reflux   Significant Hospital Events: Including procedures, antibiotic start and stop dates in addition to other pertinent events   [facility] resp distress/ AMS requiring intubation, tx to [facility]   Interim History / Subjective:  No acute events overnight Sister at bedside.  Objective   Blood pressure 137/90, pulse 77, temperature 99.4 F (37.4 C), temperature source Oral, resp. rate 15, height 5\' 4"  (1.626 m), weight 124.2 kg, SpO2 94 %.    Vent Mode: PRVC FiO2 (%):  [40 %] 40 % Set Rate:  [18 bmp] 18 bmp Vt Set:  [440 mL] 440 mL PEEP:  [5 cmH20] 5 cmH20 Pressure Support:  [5 cmH20] 5 cmH20 Plateau Pressure:  [10 cmH20-21 cmH20] 21 cmH20   Intake/Output Summary (Last 24 hours) at 07/22/2021 0904 Last data filed at 07/22/2021 0700 Gross per 24 hour  Intake 2139.72 ml  Output 3100 ml  Net -960.28 ml   Filed Weights   07/17/21 1749 07/18/21 0600 07/19/21 1624  Weight: 125.6 kg 125.4 kg 124.2 kg   Examination: General:  In bed on vent HENT: NCAT ETT in place PULM: No  adventitious breath sounds on bilateral upper lung fields, decreased breath sounds in bilateral lower lobes , no wheezing CV: RRR, no mgr GI: BS+, soft, nontender MSK: normal bulk and tone Neuro: sedated on vent  Attending: some wheezes on R lung field,vent waveforms c/w mild bronchospasm.  + cuff leak.  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  -VAP prevention protocol -sedation with goal RASS 0. Wean propofol and fentanyl. Will start on Precedex with goal of getting patient reading for SBT today. Cuff leak on exam.  -daily SAT & SBT  -maintain net negative fluid balance, Changed Lasix 40mg  from BID to daily -Continue bronchodilators, Brovana, Yupelri, Pulmicort -Continue Claritin and Singulair -Was on steroids 2/15 through 2/19.  Stop for now. -After extubation will need nocturnal CPAP -[name] optimizing GERD asthma control OSA  HFpEF  HTN -Roughly net even on admission ,continue Lasix 40 mg daily - Normotensive , continue to hold home hydralazine  Rheumatoid arthritis, chronic  - Continue PTA Plaquenil  Seizures, chronic -con't PTA keppra  GERD, chronic -PPI  Diabetes mellitus, type II -Hemoglobin A1c, 5.8.  Diagnosis  05/2021. -On metformin at home -Required 13 units aspart yesterday, in setting of steroids -Continue SSI -Notable oal BG 140-180  Obesity -  BMI 47 -plans for bariatric surgery at [facility]   [facility] (right click and "Reselect all SmartList Selections" daily)   Diet/type: NPO DVT prophylaxis: LMWH GI prophylaxis: PPI Lines: N/A Foley:  Yes, and it is still needed Code Status:  full code Last date of multidisciplinary  goals of care discussion [ sister updated at bedside 2/19]  07/22/2021 I saw and evaluated the patient. Discussed with resident and agree with resident's findings and plan as documented in the resident's note.  Edits made to plan above.  Overall improved, work toward extubation today.  Will speak with [facility] and see if we can get her on xolair as an outpatient, her TH2 labs are extremely high (medicaid may not approve due to weight but can check).  34 min cc time  Myrla Halsted MD [facility] Prefer epic messenger for cross cover needs If after hours, please call [facility]

## 2021-07-22 NOTE — Procedures (Signed)
Extubation Procedure Note  Patient Details:   Name: Arilene Metters DOB: September 11, 1975 MRN: [id number]   Airway Documentation:    Vent end date: 07/22/21 Vent end time: 0940   Evaluation  O2 sats: stable throughout Complications: No apparent complications Patient did tolerate procedure well. Bilateral Breath Sounds: Expiratory wheezes   Yes  Toula Moos 07/22/2021, 9:47 AM

## 2021-07-22 NOTE — Telephone Encounter (Signed)
Appt in 2-3 weeks for posthospitalization asthma followup at [facility], re-consideration for biologic if eligible.

## 2021-07-22 NOTE — Progress Notes (Signed)
[facility] Physician-Brief Progress Note Patient Name: Elizabeth Bautista DOB: [birth year] MRN: [id number]   Date of Service  07/22/2021  HPI/Events of Note  Request for home CPAP orders. Patient says she is on CPAP = 5.  eICU Interventions  Plan: CPAP = 5 Q HS. RT to adjust as needed. Keep sat > 92%.     Intervention Category Major Interventions: Other:  Lenell Antu 07/22/2021, 8:36 PM

## 2021-07-23 DIAGNOSIS — J4551 Severe persistent asthma with (acute) exacerbation: Secondary | ICD-10-CM | POA: Diagnosis not present

## 2021-07-23 LAB — GLUCOSE, CAPILLARY
Glucose-Capillary: 104 mg/dL — ABNORMAL HIGH (ref 70–99)
Glucose-Capillary: 119 mg/dL — ABNORMAL HIGH (ref 70–99)
Glucose-Capillary: 160 mg/dL — ABNORMAL HIGH (ref 70–99)
Glucose-Capillary: 151 mg/dL — ABNORMAL HIGH (ref 70–99)
Glucose-Capillary: 154 mg/dL — ABNORMAL HIGH (ref 70–99)
Glucose-Capillary: 125 mg/dL — ABNORMAL HIGH (ref 70–99)
Glucose-Capillary: 158 mg/dL — ABNORMAL HIGH (ref 70–99)

## 2021-07-23 LAB — BASIC METABOLIC PANEL WITH GFR
Anion gap: 10 (ref 5–15)
BUN: 23 mg/dL — ABNORMAL HIGH (ref 6–20)
CO2: 28 mmol/L (ref 22–32)
Calcium: 8.6 mg/dL — ABNORMAL LOW (ref 8.9–10.3)
Chloride: 96 mmol/L — ABNORMAL LOW (ref 98–111)
Creatinine, Ser: 0.67 mg/dL (ref 0.44–1.00)
GFR, Estimated: >60 mL/min
Glucose, Bld: 103 mg/dL — ABNORMAL HIGH (ref 70–99)
Potassium: 3.3 mmol/L — ABNORMAL LOW (ref 3.5–5.1)
Sodium: 134 mmol/L — ABNORMAL LOW (ref 135–145)

## 2021-07-23 LAB — CULTURE, RESPIRATORY W GRAM STAIN: Culture: NORMAL

## 2021-07-23 LAB — MAGNESIUM: Magnesium: 2.5 mg/dL — ABNORMAL HIGH (ref 1.7–2.4)

## 2021-07-23 LAB — PROCALCITONIN: Procalcitonin: <0.1 ng/mL

## 2021-07-23 MED ORDER — MONTELUKAST SODIUM 10 MG PO TABS
10.0000 mg | ORAL_TABLET | Freq: Every day | ORAL | Status: DC
  Administered 2021-07-23 – 2021-07-24 (×2): 10 mg via ORAL
  Filled 2021-07-23 (×2): qty 1

## 2021-07-23 MED ORDER — ACETAMINOPHEN 325 MG PO TABS
650.0000 mg | ORAL_TABLET | Freq: Four times a day (QID) | ORAL | Status: DC | PRN
  Administered 2021-07-23: 650 mg via ORAL
  Filled 2021-07-23 (×2): qty 2

## 2021-07-23 MED ORDER — BUSPIRONE HCL 5 MG PO TABS
7.5000 mg | ORAL_TABLET | Freq: Two times a day (BID) | ORAL | Status: DC
  Administered 2021-07-23 – 2021-07-25 (×5): 7.5 mg via ORAL
  Filled 2021-07-23: qty 2
  Filled 2021-07-23: qty 1
  Filled 2021-07-23 (×3): qty 2

## 2021-07-23 MED ORDER — LEVETIRACETAM 100 MG/ML PO SOLN
500.0000 mg | Freq: Two times a day (BID) | ORAL | Status: DC
Start: 2021-07-23 — End: 2021-07-24
  Administered 2021-07-23 (×2): 500 mg via ORAL
  Filled 2021-07-23 (×3): qty 5

## 2021-07-23 MED ORDER — LORATADINE 10 MG PO TABS
10.0000 mg | ORAL_TABLET | Freq: Every day | ORAL | Status: DC
  Administered 2021-07-23 – 2021-07-25 (×3): 10 mg via ORAL
  Filled 2021-07-23 (×3): qty 1

## 2021-07-23 MED ORDER — HYDROXYCHLOROQUINE SULFATE 200 MG PO TABS
200.0000 mg | ORAL_TABLET | Freq: Two times a day (BID) | ORAL | Status: DC
  Administered 2021-07-23 – 2021-07-25 (×5): 200 mg via ORAL
  Filled 2021-07-23 (×6): qty 1

## 2021-07-23 MED ORDER — SENNOSIDES-DOCUSATE SODIUM 8.6-50 MG PO TABS
1.0000 | ORAL_TABLET | Freq: Two times a day (BID) | ORAL | Status: DC
  Administered 2021-07-23 – 2021-07-25 (×5): 1 via ORAL
  Filled 2021-07-23 (×5): qty 1

## 2021-07-23 MED ORDER — POTASSIUM CHLORIDE CRYS ER 20 MEQ PO TBCR
20.0000 meq | EXTENDED_RELEASE_TABLET | ORAL | Status: AC
  Administered 2021-07-23 (×2): 20 meq via ORAL
  Filled 2021-07-23 (×2): qty 1

## 2021-07-23 MED ORDER — PANTOPRAZOLE 2 MG/ML SUSPENSION
40.0000 mg | Freq: Every day | ORAL | Status: DC
  Administered 2021-07-23: 40 mg via ORAL
  Filled 2021-07-23 (×3): qty 20

## 2021-07-23 MED ORDER — ACETAMINOPHEN 650 MG RE SUPP: 650.0000 mg | Freq: Four times a day (QID) | RECTAL | Status: DC | PRN

## 2021-07-23 MED ORDER — POTASSIUM CHLORIDE 10 MEQ/100ML IV SOLN
10.0000 meq | INTRAVENOUS | Status: AC
  Administered 2021-07-23 (×3): 10 meq via INTRAVENOUS
  Filled 2021-07-23 (×4): qty 100

## 2021-07-23 MED ORDER — POTASSIUM CHLORIDE 10 MEQ/100ML IV SOLN
10.0000 meq | Freq: Once | INTRAVENOUS | Status: AC
  Administered 2021-07-23: 10 meq via INTRAVENOUS

## 2021-07-23 MED ORDER — VITAMIN D 25 MCG (1000 UNIT) PO TABS
5000.0000 [IU] | ORAL_TABLET | Freq: Every day | ORAL | Status: DC
  Administered 2021-07-23 – 2021-07-25 (×3): 5000 [IU] via ORAL
  Filled 2021-07-23 (×3): qty 5

## 2021-07-23 NOTE — Progress Notes (Addendum)
PROGRESS NOTE    Elizabeth Bautista  RUE:[id number] DOB: July 02, 1975 DOA: 07/17/2021 PCP: Anabel Halon, MD   Brief Narrative:  46 year old female with long history of severe persistent allergic asthma with history of multiple hospitalizations for asthma exacerbation in addition to history of intubation from asthma. She presented to the emergency department with 3 days of progressive wheezing dyspnea and lightheadedness.  She has been seen by Dr. Craige Cotta in pulmonology who reported that breztri was not effective for her and yupelri was too expensive. She had been placed on a trial of Trelegy 200 one puff daily at her last office visit.  She said that she had been doing fairly well on trelegy until the weather started warming up and pollen counts began to rise.  She denies fever and chills.  She has OSA and is compliant with nightly CPAP.  She also has rheumatoid arthritis and has been on Plaquenil in the past and followed by Dr. Sheliah Hatch with [facility] rheumatology.  She also has morbid obesity and has [facility]     Pt was placed on continuous albuterol bronchodilator therapy in ED on arrival.  She had some improvement but not back to baseline and with her history of rapid decompensation it was thought best practice that she be monitored in hospital under observation.  Her CXR did not show any findings of pneumonia.  Her sars 2 coronavirus testing was negative.  Subsequently on 07/20/2021, her respiratory status got worse and she needed to be intubated.  After that she was transferred to [facility], ICU.  Extubated on 07/22/2021 and transferred under TRH on 07/23/2021.  Assessment & Plan:   Principal Problem:   Severe asthma with exacerbation Active Problems:   OSA on CPAP   Seropositive rheumatoid arthritis (HCC)   Class 3 severe obesity with serious comorbidity and body mass index (BMI) of 45.0 to 49.9 in adult [facility])   Essential hypertension   Vitamin  D deficiency   Type 2 diabetes mellitus with hyperglycemia, without long-term current use of insulin (HCC)   Upper airway cough syndrome   GERD (gastroesophageal reflux disease)   Anxiety   History of seizures   High risk medication use   Body mass index (BMI) 45.0-49.9, adult (HCC)   (HFpEF) heart failure with preserved ejection fraction (HCC)   Acute respiratory failure with hypoxia (HCC)  Acute hypoxic respiratory failure secondary to severe asthma with exacerbation- (present on admission): Patient needed to be intubated, finally extubated on 07/22/2021.  Details as above.  Currently on 3 L but still has some shortness of breath.  Wheezy on exam. Continue bronchodilators, Brovana, Yupelri, Pulmicort, Claritin and Singulair.  We will try to wean.  We will continue IV Lasix for another day. Was on steroids 2/15 through 2/19.  Stop for now. Continue nocturnal CPAP -[name] optimizing GERD asthma control OSA Sputum culture negative.  Procalcitonin unremarkable.  She is afebrile.  Will discontinue antibiotics.  History of seizures: Continue Keppra 500 mg p.o. twice daily.   Anxiety- (present on admission): Continue home medications.   GERD (gastroesophageal reflux disease)- (present on admission): Continue Protonix.  Type 2 diabetes mellitus with hyperglycemia, without long-term current use of insulin (HCC)- (present on admission): Hemoglobin A1c 5.8 on 07/17/2021.  Continue to hold metformin.  Blood sugar control will continue SSI.   Essential hypertension- (present on admission): Appears to be taking only hydrochlorothiazide at home which is on hold.  Blood pressure controlled.   Class 3 severe obesity with serious comorbidity and body mass index (BMI) of 45.0 to 49.9 in adult [facility]) - Pt had plans to follow up with [facility].    Seropositive rheumatoid arthritis (HCC)- (present on admission) - She is followed by an outpatient rheumatologist -  Resume home plaquenil therapy    OSA on CPAP -continue nightly CPAP therapy - she has been compliant using in hospital   Nausea: As needed Zofran.  DVT prophylaxis:    Code Status: Full Code  Family Communication:  None present at bedside.  Plan of care discussed with patient in length and he/she verbalized understanding and agreed with it.  Status is: Inpatient Remains inpatient appropriate because: Still with respiratory distress.   Estimated body mass index is 47 kg/m as calculated from the following:   Height as of this encounter:  (1.626 m).   Weight as of this encounter: 124.2 kg.    Nutritional Assessment: Body mass index is 47 kg/m.Marland Kitchen Seen by dietician.  I agree with the assessment and plan as outlined below: Nutrition Status:    Skin Assessment: I have examined the patient's skin and I agree with the wound assessment as performed by the wound care RN as outlined below:    Consultants:  None  Procedures:  Intubation  Antimicrobials:  Anti-infectives (From admission, onward)    Start     Dose/Rate Route Frequency Ordered Stop   07/23/21 1000  hydroxychloroquine (PLAQUENIL) tablet 200 mg        200 mg Oral 2 times daily 07/23/21 0447     07/21/21 2230  vancomycin (VANCOCIN) IVPB 1000 mg/200 mL premix  Status:  Discontinued        1,000 mg 200 mL/hr over 60 Minutes Intravenous Every 12 hours 07/21/21 2140 07/23/21 1146   07/21/21 2200  vancomycin (VANCOREADY) IVPB 1000 mg/200 mL  Status:  Discontinued        1,000 mg 200 mL/hr over 60 Minutes Intravenous Every 12 hours 07/21/21 1012 07/21/21 2140   07/21/21 1100  vancomycin (VANCOREADY) IVPB 2000 mg/400 mL        2,000 mg 200 mL/hr over 120 Minutes Intravenous  Once 07/21/21 1011 07/21/21 1430   07/21/21 1000  ceFEPIme (MAXIPIME) 2 g in sodium chloride 0.9 % 100 mL IVPB  Status:  Discontinued        2 g 200 mL/hr over 30 Minutes Intravenous Every 8 hours 07/21/21 0846 07/23/21 1146   07/20/21 2200   hydroxychloroquine (PLAQUENIL) tablet 200 mg  Status:  Discontinued        200 mg Per Tube 2 times daily 07/20/21 1553 07/23/21 0447   07/17/21 2200  hydroxychloroquine (PLAQUENIL) tablet 200 mg  Status:  Discontinued        200 mg Oral 2 times daily 07/17/21 1056 07/20/21 1553         Subjective: Seen and examined.  She was complaining of nausea.  Some shortness of breath.  No other complaint.  Objective: Vitals:   07/23/21 1000 07/23/21 1100 07/23/21 1125 07/23/21 1217  BP: 129/82 (!) 150/91  131/81  Pulse: 65 80  83  Resp: Temp:  98.8 F (37.1 C)  97.9 F (36.6 C)  TempSrc:  Oral  Oral  SpO2: 99% 100% 98% 97%  Weight:      Height:        Intake/Output Summary (Last 24 hours) at 07/23/2021 1302 Last data filed  at 07/23/2021 1100 Gross per 24 hour  Intake 1055.91 ml  Output 2450 ml  Net -1394.09 ml   Filed Weights   07/17/21 1749 07/18/21 0600 07/19/21 1624  Weight: 125.6 kg 125.4 kg 124.2 kg    Examination:  General exam: Appears calm and comfortable, obese Respiratory system: Diffuse expiratory wheezes bilaterally.  Poor inspiratory effort. Cardiovascular system: S1 & S2 heard, RRR. No JVD, murmurs, rubs, gallops or clicks. No pedal edema. Gastrointestinal system: Abdomen is nondistended, soft and nontender. No organomegaly or masses felt. Normal bowel sounds heard. Central nervous system: Alert and oriented. No focal neurological deficits. Extremities: Symmetric 5 x 5 power. Skin: No rashes, lesions or ulcers Psychiatry: Judgement and insight appear normal. Mood & affect appropriate.    Data Reviewed: I have personally reviewed following labs and imaging studies  CBC: Recent Labs  Lab 07/17/21 0712 07/20/21 1743 07/21/21 0211 07/22/21 0705  WBC 6.9  --  12.4* 14.7*  NEUTROABS 4.3  --   --  12.4*  HGB 12.6 13.9 12.5 12.8  HCT 40.1 41.0 40.9 40.3  MCV 83.9  --  83.8 84.1  PLT 214  --  259 268   Basic Metabolic Panel: Recent Labs  Lab  07/18/21 0346 07/20/21 1743 07/20/21 1903 07/21/21 0211 07/22/21 0705 07/23/21 0125  NA 137 135 137 133* 136 134*  K 4.3 3.3* 3.4* 3.9 4.1 3.3*  CL 108  --  97* 95* 96* 96*  CO2 19*  --  28 27 30 28   GLUCOSE 196*  --  103* 155* 152* 103*  BUN 10  --  14 14 22* 23*  CREATININE 0.64  --  0.78 0.78 0.90 0.67  CALCIUM 9.0  --  8.6* 8.4* 8.6* 8.6*  MG 2.0  --  2.2 2.4 2.8* 2.5*  PHOS  --   --  5.6*  --   --   --    GFR: Estimated Creatinine Clearance: 113.2 mL/min (by C-G formula based on SCr of 0.67 mg/dL). Liver Function Tests: Recent Labs  Lab 07/17/21 0712 07/20/21 1903  AST 35  --   ALT 38  --   ALKPHOS 101  --   BILITOT 0.2*  --   PROT 7.0  --   ALBUMIN 3.6 3.4*   No results for input(s): LIPASE, AMYLASE in the last 168 hours. No results for input(s): AMMONIA in the last 168 hours. Coagulation Profile: No results for input(s): INR, PROTIME in the last 168 hours. Cardiac Enzymes: No results for input(s): CKTOTAL, CKMB, CKMBINDEX, TROPONINI in the last 168 hours. BNP (last 3 results) No results for input(s): PROBNP in the last 8760 hours. HbA1C: No results for input(s): HGBA1C in the last 72 hours. CBG: Recent Labs  Lab 07/22/21 2324 07/23/21 0337 07/23/21 0707 07/23/21 1107 07/23/21 1231  GLUCAP 132* 104* 119* 160* 151*   Lipid Profile: Recent Labs    07/21/21 0211  TRIG 94   Thyroid Function Tests: No results for input(s): TSH, T4TOTAL, FREET4, T3FREE, THYROIDAB in the last 72 hours. Anemia Panel: No results for input(s): VITAMINB12, FOLATE, FERRITIN, TIBC, IRON, RETICCTPCT in the last 72 hours. Sepsis Labs: Recent Labs  Lab 07/17/21 0712 07/18/21 0346 07/23/21 0125  PROCALCITON <0.10 <0.10 <0.10    Recent Results (from the past 240 hour(s))  Resp Panel by RT-PCR (Flu A&B, Covid) Nasopharyngeal Swab     Status: None   Collection Time: 07/17/21  7:12 AM   Specimen: Nasopharyngeal Swab; Nasopharyngeal(NP) swabs in vial transport medium  Result  Value Ref Range Status   SARS Coronavirus 2 by RT PCR NEGATIVE NEGATIVE Final    Comment: (NOTE) SARS-CoV-2 target nucleic acids are NOT DETECTED.  The SARS-CoV-2 RNA is generally detectable in upper respiratory specimens during the acute phase of infection. The lowest concentration of SARS-CoV-2 viral copies this assay can detect is 138 copies/mL. A negative result does not preclude SARS-Cov-2 infection and should not be used as the sole basis for treatment or other patient management decisions. A negative result may occur with  improper specimen collection/handling, submission of specimen other than nasopharyngeal swab, presence of viral mutation(s) within the areas targeted by this assay, and inadequate number of viral copies(<138 copies/mL). A negative result must be combined with clinical observations, patient history, and epidemiological information. The expected result is Negative.  Fact Sheet for Patients:  BloggerCourse.com  Fact Sheet for Healthcare Providers:  SeriousBroker.it  This test is no t yet approved or cleared by the [place] FDA and  has been authorized for detection and/or diagnosis of SARS-CoV-2 by FDA under an Emergency Use Authorization (EUA). This EUA will remain  in effect (meaning this test can be used) for the duration of the COVID-19 declaration under Section 564(b)(1) of the Act, 21 U.S.C.section 360bbb-3(b)(1), unless the authorization is terminated  or revoked sooner.       Influenza A by PCR NEGATIVE NEGATIVE Final   Influenza B by PCR NEGATIVE NEGATIVE Final    Comment: (NOTE) The Xpert Xpress SARS-CoV-2/FLU/RSV plus assay is intended as an aid in the diagnosis of influenza from Nasopharyngeal swab specimens and should not be used as a sole basis for treatment. Nasal washings and aspirates are unacceptable for Xpert Xpress SARS-CoV-2/FLU/RSV testing.  Fact Sheet for  Patients: BloggerCourse.com  Fact Sheet for Healthcare Providers: SeriousBroker.it  This test is not yet approved or cleared by the [place] FDA and has been authorized for detection and/or diagnosis of SARS-CoV-2 by FDA under an Emergency Use Authorization (EUA). This EUA will remain in effect (meaning this test can be used) for the duration of the COVID-19 declaration under Section 564(b)(1) of the Act, 21 U.S.C. section 360bbb-3(b)(1), unless the authorization is terminated or revoked.  Performed at [facility], [address] [place], [place] [address]   MRSA Next Gen by PCR, Nasal     Status: Abnormal   Collection Time: 07/17/21  5:55 PM   Specimen: Nasal Mucosa; Nasal Swab  Result Value Ref Range Status   MRSA by PCR Next Gen DETECTED (A) NOT DETECTED Final    Comment: RESULT CALLED TO, READ BACK BY AND VERIFIED WITH: Aura Fey RN [phone] K FORSYTH (NOTE) The GeneXpert MRSA Assay (FDA approved for NASAL specimens only), is one component of a comprehensive MRSA colonization surveillance program. It is not intended to diagnose MRSA infection nor to guide or monitor treatment for MRSA infections. Test performance is not FDA approved in patients less than 60 years old. Performed at [facility], [address] [place], [place] [address]   Culture, Respiratory w Gram Stain     Status: None   Collection Time: 07/21/21  8:26 AM   Specimen: Tracheal Aspirate; Respiratory  Result Value Ref Range Status   Specimen Description TRACHEAL ASPIRATE  Final   Special Requests NONE  Final   Gram Stain   Final    RARE WBC PRESENT,BOTH PMN AND MONONUCLEAR NO ORGANISMS SEEN    Culture   Final    RARE Normal respiratory flora-no Staph aureus  or Pseudomonas seen Performed at [facility], [address] [place], [place] [address]    Report Status 07/23/2021 FINAL  Final     Radiology Studies: No results found.  Scheduled  Meds:  albuterol  2.5 mg Nebulization Q4H   arformoterol  15 mcg Nebulization BID   budesonide (PULMICORT) nebulizer solution  0.5 mg Nebulization BID   busPIRone  7.5 mg Oral BID   Chlorhexidine Gluconate Cloth  6 each Topical Daily   cholecalciferol  5,000 Units Oral Daily   enoxaparin (LOVENOX) injection  60 mg Subcutaneous Daily   furosemide  40 mg Intravenous Daily   hydroxychloroquine  200 mg Oral BID   insulin aspart  0-15 Units Subcutaneous Q4H   levETIRAcetam  500 mg Oral BID   loratadine  10 mg Oral Daily   mouth rinse  15 mL Mouth Rinse BID   montelukast  10 mg Oral QHS   pantoprazole sodium  40 mg Oral Daily   revefenacin  175 mcg Nebulization Daily   senna-docusate  1 tablet Oral BID   Continuous Infusions:   LOS: 6 days   Time spent: 34-minute  Hughie Closs, MD [facility]  07/23/2021, 1:02 PM  Please page via Loretha Stapler and do not message via secure chat for urgent patient care matters. Secure chat can be used for non urgent patient care matters.  How to contact the [facility] Attending or Consulting provider 7A - 7P or covering provider during after hours 7P -7A, for this patient?  Check the care team in [facility] and look for a) attending/consulting [facility] provider listed and b) the [facility] team listed. Page or secure chat 7A-7P. Log into [email or web address] and use [place]'s universal password to access. If you do not have the password, please contact the hospital operator. Locate the [facility] provider you are looking for under [facility] and page to a number that you can be directly reached. If you still have difficulty reaching the provider, please page the [facility] (Director on Call) for the Hospitalists listed on amion for assistance.

## 2021-07-23 NOTE — Telephone Encounter (Signed)
Called and spoke with patient. She stated that she is ok with coming to [place]. I was able to get her schedule to see Beth on 08/06/21 at 2pm. She is aware that the OV will be in [place].   Nothing further needed at time of call.

## 2021-07-23 NOTE — Evaluation (Signed)
Physical Therapy Evaluation Patient Details Name: Elizabeth Bautista MRN: [id number] DOB: 1976-05-09 Today's Date: 07/23/2021  History of Present Illness  46 year old female presented to presented to [facility] ED 2/15 with 3 days of progressive wheezing dyspnea and lightheadedness. placed on continuous albuterol bronchodilator therapy in ED. 07/20/2021, her respiratory status got worse and she needed to be intubated.  After that she was transferred to [facility], ICU.  Extubated on 07/22/2021   PMH: severe persistent allergic asthma with history of multiple hospitalizations for asthma exacerbation in addition to history of intubation from asthma most recently discharged from the hospital in April 2022 for asthma exacerbation.  Clinical Impression  PTA pt living in two story home with 4 steps to enter and 8 steps to second floor with her 2 sons who work leaving her home for numerous hours during the morning. Pt report using SPC, RW and Rollator for household ambulation, pt has transportation come and get her for appointments. Pt daughter assists with bathing, family assists for iADLs. Pt is currently severely limited by pain in L hip and back, which causes L LE tremors with PROM. Pt also has decreased strength. Pt refuses bed mobility today due to pain. PT recommending SNF level rehab at this time due to lack of mobility. Pt will continue to work with pt toward discharge home with HHPT if pain can be better controlled.        Recommendations for follow up therapy are one component of a multi-disciplinary discharge planning process, led by the attending physician.  Recommendations may be updated based on patient status, additional functional criteria and insurance authorization.  Follow Up Recommendations Skilled nursing-short term rehab (<3 hours/day)    Assistance Recommended at Discharge Frequent or constant Supervision/Assistance  Patient can return home with the following  A lot of help with walking  and/or transfers;A lot of help with bathing/dressing/bathroom;Assistance with cooking/housework;Direct supervision/assist for medications management;Direct supervision/assist for financial management;Assist for transportation;Help with stairs or ramp for entrance    Equipment Recommendations None recommended by PT (has necessary equipment)     Functional Status Assessment Patient has had a recent decline in their functional status and demonstrates the ability to make significant improvements in function in a reasonable and predictable amount of time.     Precautions / Restrictions Precautions Precautions: Fall (during seizures) Restrictions Weight Bearing Restrictions: No      Mobility  Bed Mobility               General bed mobility comments: deferred due to increased back pain with ranging L LE                        Pertinent Vitals/Pain Pain Assessment Pain Assessment: 0-10 Pain Score: 10-Worst pain ever Pain Location: R hand/arm with K infusion, L back and hip nerve pain with any movement Pain Descriptors / Indicators: Sharp, Shooting, Constant, Tingling, Numbness Pain Intervention(s): Limited activity within patient's tolerance, Monitored during session, Repositioned    Home Living Family/patient expects to be discharged to:: Private residence Living Arrangements: Children Available Help at Discharge: Family;Available PRN/intermittently Type of Home: House Home Access: Stairs to enter Entrance Stairs-Rails: Left Entrance Stairs-Number of Steps: 4 Alternate Level Stairs-Number of Steps: 8 Home Layout: Two level;Bed/bath upstairs;Able to live on main level with bedroom/bathroom Home Equipment: Cane - single point;Rolling Walker (2 wheels);Rollator (4 wheels);Tub bench      Prior Function Prior Level of Function : Needs assist  Physical Assist : Mobility (physical);ADLs (physical) Mobility (physical): Stairs;Gait ADLs (physical):  IADLs;Bathing Mobility Comments: ambulates with SPC, RW and Rollator as back and L hip pain allows ADLs Comments: uses transport company for transportation to appointements, pt needs assist from daughter for some bathing and lower body dressing        Extremity/Trunk Assessment   Upper Extremity Assessment Upper Extremity Assessment: Defer to OT evaluation (complaints of R forearm pain due to K infustion)    Lower Extremity Assessment Lower Extremity Assessment: LLE deficits/detail;RLE deficits/detail RLE Deficits / Details: WFL, strength grossly 4/5 LLE Deficits / Details: L ankle AROM WFL, AAROM of L knee caused severe pain and tremoring unable to assist hip due to pain with movement LLE: Unable to fully assess due to pain LLE Sensation: decreased light touch       Communication   Communication: No difficulties;Other (comment) (decreased phonation due to pain and fatigue)  Cognition Arousal/Alertness: Lethargic Behavior During Therapy: Flat affect Overall Cognitive Status: Within Functional Limits for tasks assessed                                          General Comments General comments (skin integrity, edema, etc.): VSS on 3L O2     Assessment/Plan    PT Assessment Patient needs continued PT services  PT Problem List Decreased strength;Decreased range of motion;Decreased activity tolerance;Decreased balance;Decreased mobility;Decreased cognition;Decreased coordination;Cardiopulmonary status limiting activity;Pain;Impaired sensation       PT Treatment Interventions DME instruction;Gait training;Stair training;Functional mobility training;Therapeutic activities;Therapeutic exercise;Balance training;Cognitive remediation;Patient/family education    PT Goals (Current goals can be found in the Care Plan section)  Acute Rehab PT Goals Patient Stated Goal: have less pain PT Goal Formulation: With patient Time For Goal Achievement: 08/06/21 Potential to  Achieve Goals: Fair    Frequency Min 2X/week        AM-PAC PT "6 Clicks" Mobility  Outcome Measure Help needed turning from your back to your side while in a flat bed without using bedrails?: A Lot Help needed moving from lying on your back to sitting on the side of a flat bed without using bedrails?: Total Help needed moving to and from a bed to a chair (including a wheelchair)?: Total Help needed standing up from a chair using your arms (e.g., wheelchair or bedside chair)?: Total Help needed to walk in hospital room?: Total Help needed climbing 3-5 steps with a railing? : Total 6 Click Score: 7    End of Session Equipment Utilized During Treatment: Oxygen Activity Tolerance: Patient limited by pain Patient left: in bed;with bed alarm set;with call bell/phone within reach;with nursing/sitter in room Nurse Communication: Mobility status (request for heat packs) PT Visit Diagnosis: Unsteadiness on feet (R26.81);Other abnormalities of gait and mobility (R26.89);Repeated falls (R29.6);Muscle weakness (generalized) (M62.81);History of falling (Z91.81);Difficulty in walking, not elsewhere classified (R26.2);Pain Pain - Right/Left: Left Pain - part of body: Hip;Leg (back)    Time: 4098-1191 PT Time Calculation (min) (ACUTE ONLY): 16 min   Charges:   PT Evaluation $PT Eval Moderate Complexity: 1 Mod          Keijuan Schellhase B. Beverely Risen PT, DPT Acute Rehabilitation Services Pager [phone] Office [phone]   Elon Alas Fleet 07/23/2021, 2:35 PM

## 2021-07-23 NOTE — Progress Notes (Signed)
[facility] ADULT ICU REPLACEMENT PROTOCOL   The patient does apply for the [facility] Adult ICU Electrolyte Replacment Protocol based on the criteria listed below:   1.Exclusion criteria: TCTS patients, ECMO patients, and Dialysis patients 2. Is GFR >/= 30 ml/min? Yes.    Patient's GFR today is >60 3. Is SCr </= 2? Yes.   Patient's SCr is 0.67 mg/dL 4. Did SCr increase >/= 0.5 in 24 hours? No. 5.Pt's weight >40kg  Yes.   6. Abnormal electrolyte(s): K+3.3  7. Electrolytes replaced per protocol 8.  Call MD STAT for K+ </= 2.5, Phos </= 1, or Mag </= 1 Physician:  Kendell Bane 07/23/2021 4:18 AM

## 2021-07-23 NOTE — Telephone Encounter (Signed)
I don't think I have any availability in [place] office until the last week of March.  Can you see if she can come to [facility] and be seen by me or an NP.  She has been followed by Dr. Dellis Anes with asthma, and was approved through their office for nucala previously.

## 2021-07-24 DIAGNOSIS — J4551 Severe persistent asthma with (acute) exacerbation: Secondary | ICD-10-CM | POA: Diagnosis not present

## 2021-07-24 LAB — MAGNESIUM: Magnesium: 2.1 mg/dL (ref 1.7–2.4)

## 2021-07-24 LAB — BASIC METABOLIC PANEL WITH GFR
Anion gap: 9 (ref 5–15)
BUN: 15 mg/dL (ref 6–20)
CO2: 30 mmol/L (ref 22–32)
Calcium: 8.4 mg/dL — ABNORMAL LOW (ref 8.9–10.3)
Chloride: 96 mmol/L — ABNORMAL LOW (ref 98–111)
Creatinine, Ser: 0.7 mg/dL (ref 0.44–1.00)
GFR, Estimated: >60 mL/min
Glucose, Bld: 135 mg/dL — ABNORMAL HIGH (ref 70–99)
Potassium: 3.7 mmol/L (ref 3.5–5.1)
Sodium: 135 mmol/L (ref 135–145)

## 2021-07-24 LAB — GLUCOSE, CAPILLARY
Glucose-Capillary: 125 mg/dL — ABNORMAL HIGH (ref 70–99)
Glucose-Capillary: 146 mg/dL — ABNORMAL HIGH (ref 70–99)
Glucose-Capillary: 152 mg/dL — ABNORMAL HIGH (ref 70–99)
Glucose-Capillary: 153 mg/dL — ABNORMAL HIGH (ref 70–99)
Glucose-Capillary: 157 mg/dL — ABNORMAL HIGH (ref 70–99)
Glucose-Capillary: 172 mg/dL — ABNORMAL HIGH (ref 70–99)

## 2021-07-24 MED ORDER — LEVETIRACETAM 500 MG PO TABS
500.0000 mg | ORAL_TABLET | Freq: Two times a day (BID) | ORAL | Status: DC
  Administered 2021-07-24 – 2021-07-25 (×3): 500 mg via ORAL
  Filled 2021-07-24 (×3): qty 1

## 2021-07-24 MED ORDER — ALBUTEROL SULFATE (2.5 MG/3ML) 0.083% IN NEBU
2.5000 mg | INHALATION_SOLUTION | Freq: Four times a day (QID) | RESPIRATORY_TRACT | Status: DC
  Administered 2021-07-24 – 2021-07-25 (×2): 2.5 mg via RESPIRATORY_TRACT
  Filled 2021-07-24 (×2): qty 3

## 2021-07-24 NOTE — TOC Initial Note (Addendum)
Transition of Care [facility]) - Initial/Assessment Note    Patient Details  Name: Elizabeth Bautista MRN: [id number] Date of Birth: 09/10/1975  Transition of Care [facility] CM/SW Contact:    Elizabeth Bautista Elizabeth Bautista, Student-Social Work Phone Number: 07/24/2021, 3:51 PM  Clinical Narrative:                 CSW intern visited patient at bedside with their cousin in the room, the patient was okay with their cousin remaining in the room while we discussed her discharge plans. Physical therapy is currently recommending SNF however, the patient is declining a SNF placement and wishes to go home. The patient states they live at home with their two sons who will be able to care for her, the patient currently has a bedside commode, Elizabeth Bautista, cane, nebulizer and cpap machine at home.The patient would like referrals for PCS through her medicaid. CSW will follow up with patient to answer any questions that may arise. The patient also expressed their concerns on the care they have recieved while in the hospital.  Expected Discharge Plan: Skilled Nursing Facility Barriers to Discharge: Continued Medical Work up   Patient Goals and CMS Choice Patient states their goals for this hospitalization and ongoing recovery are:: Building their strength back up.  Expected Discharge Plan and Services Expected Discharge Plan: Skilled Nursing Facility In-house Referral: Clinical Social Work Discharge Planning Services: NA Post Acute Care Choice: Home Health Living arrangements for the past 2 months: Single Family Home                                      Prior Living Arrangements/Services Living arrangements for the past 2 months: Single Family Home Lives with:: Self, Adult Children Patient language and need for interpreter reviewed:: Yes Do you feel safe going back to the place where you live?: Yes      Need for Family Participation in Patient Care: Yes (Comment) Care giver support system in place?: Yes (comment) Current home  services: DME (Bedside commode, Elizabeth Bautista, and cane) Criminal Activity/Legal Involvement Pertinent to Current Situation/Hospitalization: No - Comment as needed  Activities of Daily Living Home Assistive Devices/Equipment: Cane (specify quad or straight), Elizabeth Bautista (specify type), Shower chair with back, Nebulizer, Eyeglasses, CPAP, Bedside commode/3-in-1, Brace (specify type) ADL Screening (condition at time of admission) Patient's cognitive ability adequate to safely complete daily activities?: Yes Is the patient deaf or have difficulty hearing?: No Does the patient have difficulty seeing, even when wearing glasses/contacts?: No Does the patient have difficulty concentrating, remembering, or making decisions?: No Patient able to express need for assistance with ADLs?: Yes Does the patient have difficulty dressing or bathing?: Yes Independently performs ADLs?: No Communication: Independent Dressing (OT): Needs assistance Is this a change from baseline?: Pre-admission baseline Grooming: Needs assistance Is this a change from baseline?: Pre-admission baseline Feeding: Independent Bathing: Needs assistance Is this a change from baseline?: Pre-admission baseline Toileting: Needs assistance Is this a change from baseline?: Pre-admission baseline In/Out Bed: Needs assistance Is this a change from baseline?: Pre-admission baseline Walks in Home: Independent with device (comment) Does the patient have difficulty walking or climbing stairs?: Yes Weakness of Legs: Left Weakness of Arms/Hands: Right  Permission Sought/Granted Permission sought to share information with : Case Manager                Emotional Assessment Appearance:: Developmentally appropriate Attitude/Demeanor/Rapport: Engaged Affect (typically observed): Appropriate Orientation: :  Oriented to Self, Oriented to Place, Oriented to  Time, Oriented to Situation   Psych Involvement: No (comment)  Admission diagnosis:  FB  (nasal foreign body) [T17.1XXA] Severe persistent asthma with exacerbation [J45.51] Asthma exacerbation [J45.901] Severe asthma with acute exacerbation [J45.901] Patient Active Problem List   Diagnosis Date Noted   (HFpEF) heart failure with preserved ejection fraction (HCC) 07/20/2021   Acute respiratory failure with hypoxia (HCC)    Body mass index (BMI) 45.0-49.9, adult (HCC) 02/05/2021   Iron deficiency anemia due to chronic blood loss 10/17/2020   Tension-type headache, not intractable 10/17/2020   High risk medication use 10/11/2020   Severe asthma with exacerbation 09/27/2020   Acute hip pain, left 09/19/2020   Sciatica 09/11/2020   Dysmenorrhea 06/04/2020   Menorrhagia with irregular cycle 06/04/2020   Anxiety and depression 06/04/2020   Chronic low back pain 05/24/2020   Physical deconditioning 05/24/2020   Asthma, moderate persistent 03/08/2020   GERD (gastroesophageal reflux disease) 03/07/2020   Anxiety 03/07/2020   History of seizures 03/07/2020   Upper airway cough syndrome 02/10/2020   Swelling of lower extremity 02/01/2020   Essential hypertension 02/01/2020   Vitamin D deficiency 02/01/2020   Type 2 diabetes mellitus with hyperglycemia, without long-term current use of insulin (HCC) 02/01/2020   Anemia 02/01/2020   Class 3 severe obesity with serious comorbidity and body mass index (BMI) of 45.0 to 49.9 in adult (HCC) 10/22/2019   OSA on CPAP 10/18/2019   Seropositive rheumatoid arthritis (HCC) 10/18/2019   PCP:  Elizabeth Halon, MD Pharmacy:   [facility] [phone] - [place], [place] - [address] AT [facility] [address] [place] [address] Phone: [phone] Fax: [phone]     Social Determinants of Health (SDOH) Interventions    Readmission Risk Interventions Readmission Risk Prevention Plan 10/01/2020 09/28/2020 03/12/2020  Transportation Screening Complete Complete Complete  HRI or Home Care Consult Complete Complete Complete   Social Work Consult for Recovery Care Planning/Counseling Complete Complete Complete  Palliative Care Screening Not Applicable Not Applicable Not Applicable  Medication Review Oceanographer) Complete Complete Complete  Some recent data might be hidden

## 2021-07-24 NOTE — Progress Notes (Signed)
Pt stated she could place self on cpap unit when ready for bed.

## 2021-07-24 NOTE — Progress Notes (Signed)
PROGRESS NOTE    Elizabeth Bautista  [id number] DOB: 12/26/1975 DOA: 07/17/2021 PCP: [name], MD   Brief Narrative:  46 year old female with long history of severe persistent allergic asthma with history of multiple hospitalizations for asthma exacerbation in addition to history of intubation from asthma. She presented to the [facility] emergency department with 3 days of progressive wheezing dyspnea and lightheadedness.  She has been seen by Dr. Halford Chessman in pulmonology who reported that breztri was not effective for her and yupelri was too expensive. She had been placed on a trial of Trelegy 200 one puff daily at her last office visit.  She said that she had been doing fairly well on trelegy until the weather started warming up and pollen counts began to rise.  She has OSA and is compliant with nightly CPAP.  She also has rheumatoid arthritis and has been on Plaquenil in the past and followed by Dr. [name] with [facility]ology.  She also has morbid obesity and has [facility].     Pt was placed on continuous albuterol bronchodilator therapy in ED on arrival.  She had some improvement but not back to baseline and with her history of rapid decompensation it was thought best practice that she be monitored in hospital under observation.  Her CXR did not show any findings of pneumonia.  Her sars 2 coronavirus testing was negative.  Subsequently on 07/20/2021, her respiratory status got worse and she needed to be intubated.  After that she was transferred to [facility], ICU.  Extubated on 07/22/2021 and transferred under [address] on 07/23/2021.  Assessment & Plan:   Principal Problem:   Severe asthma with exacerbation Active Problems:   OSA on CPAP   Seropositive rheumatoid arthritis (HCC)   Class 3 severe obesity with serious comorbidity and body mass index (BMI) of 45.0 to 49.9 in adult [facility])   Essential hypertension   Vitamin D deficiency   Type 2  diabetes mellitus with hyperglycemia, without long-term current use of insulin (HCC)   Upper airway cough syndrome   GERD (gastroesophageal reflux disease)   Anxiety   History of seizures   High risk medication use   Body mass index (BMI) 45.0-49.9, adult (HCC)   (HFpEF) heart failure with preserved ejection fraction (HCC)   Acute respiratory failure with hypoxia (HCC)  Acute hypoxic respiratory failure secondary to severe asthma with exacerbation- (present on admission): Patient needed to be intubated, finally extubated on 07/22/2021.  Details as above.  Currently on 3 L but still has some shortness of breath.  Wheezy on exam. Continue bronchodilators, Brovana, Yupelri, Pulmicort, Claritin and Singulair.  We will try to wean.  In order to keep her dry, we will continue IV Lasix for another day.  On my visit today, she was on 2 L of oxygen and saturating 100%, nursing has been advised to wean her down to room air. Was on steroids 2/15 through 2/19.  Continue nocturnal CPAP -[name] optimizing GERD asthma control OSA Sputum culture negative.  Procalcitonin unremarkable.  She is afebrile.  Will discontinue antibiotics.  History of seizures: Continue Keppra 500 mg p.o. twice daily.   Anxiety- (present on admission): Continue home medications.   GERD (gastroesophageal reflux disease)- (present on admission): Continue Protonix.  Type 2 diabetes mellitus with hyperglycemia, without long-term current use of insulin ([place])- (present on admission): Hemoglobin A1c 5.8 on 07/17/2021.  Continue to hold metformin.  Blood sugar  control will continue SSI.   Essential hypertension- (present on admission): Appears to be taking only hydrochlorothiazide at home which is on hold.  Blood pressure controlled.   Class 3 severe obesity with serious comorbidity and body mass index (BMI) of 45.0 to 49.9 in adult [facility]) - Pt had plans to follow up with [facility].     Seropositive rheumatoid arthritis ([place])- (present on admission) - She is followed by an outpatient rheumatologist - Resume home plaquenil therapy    OSA on CPAP -continue nightly CPAP therapy - she has been compliant using in hospital   Nausea: As needed Zofran.  Disposition: Has been seen by PT OT who recommended SNF.  Patient is very adamant that she wants to go home.  Her sister was at the bedside who was also trying to convince her to agree to go to SNF.  Patient remained firm and her decision.  I left her with my advised to reconsider her decision and she said she will.  We are going to give her another day here in the hospital, if she continues to refuse to go to SNF, she could be discharged potentially home tomorrow.  DVT prophylaxis:    Code Status: Full Code  Family Communication: Sister present at bedside.  Plan of care discussed with patient in length and he/she verbalized understanding and agreed with it.  Status is: Inpatient Remains inpatient appropriate because: Still with respiratory distress.   Estimated body mass index is 47 kg/m as calculated from the following:   Height as of this encounter: 5\' 4"  (1.626 m).   Weight as of this encounter: 124.2 kg.    Nutritional Assessment: Body mass index is 47 kg/m.Marland Kitchen Seen by dietician.  I agree with the assessment and plan as outlined below: Nutrition Status:    Skin Assessment: I have examined the patient's skin and I agree with the wound assessment as performed by the wound care RN as outlined below:    Consultants:  None  Procedures:  Intubation  Antimicrobials:  Anti-infectives (From admission, onward)    Start     Dose/Rate Route Frequency Ordered Stop   07/23/21 1000  hydroxychloroquine (PLAQUENIL) tablet 200 mg        200 mg Oral 2 times daily 07/23/21 0447     07/21/21 2230  vancomycin (VANCOCIN) IVPB 1000 mg/200 mL premix  Status:  Discontinued        1,000 mg 200 mL/hr over 60 Minutes Intravenous  Every 12 hours 07/21/21 2140 07/23/21 1146   07/21/21 2200  vancomycin (VANCOREADY) IVPB 1000 mg/200 mL  Status:  Discontinued        1,000 mg 200 mL/hr over 60 Minutes Intravenous Every 12 hours 07/21/21 1012 07/21/21 2140   07/21/21 1100  vancomycin (VANCOREADY) IVPB 2000 mg/400 mL        2,000 mg 200 mL/hr over 120 Minutes Intravenous  Once 07/21/21 1011 07/21/21 1430   07/21/21 1000  ceFEPIme (MAXIPIME) 2 g in sodium chloride 0.9 % 100 mL IVPB  Status:  Discontinued        2 g 200 mL/hr over 30 Minutes Intravenous Every 8 hours 07/21/21 0846 07/23/21 1146   07/20/21 2200  hydroxychloroquine (PLAQUENIL) tablet 200 mg  Status:  Discontinued        200 mg Per Tube 2 times daily 07/20/21 1553 07/23/21 0447   07/17/21 2200  hydroxychloroquine (PLAQUENIL) tablet 200 mg  Status:  Discontinued        200 mg  Oral 2 times daily 07/17/21 1056 07/20/21 1553         Subjective: Seen and examined.  She states that her breathing is much better now.  She had no other complaint.  She was sitting in the recliner today.  Objective: Vitals:   07/24/21 0731 07/24/21 0732 07/24/21 0759 07/24/21 1131  BP:      Pulse:   95   Resp:   15   Temp:   98 F (36.7 C)   TempSrc:   Oral   SpO2: 100% 100% 97% 93%  Weight:      Height:        Intake/Output Summary (Last 24 hours) at 07/24/2021 1152 Last data filed at 07/24/2021 1000 Gross per 24 hour  Intake --  Output 1825 ml  Net -1825 ml    Filed Weights   07/17/21 1749 07/18/21 0600 07/19/21 1624  Weight: 125.6 kg 125.4 kg 124.2 kg    Examination:  General exam: Appears calm and comfortable, obese Respiratory system: Clear to auscultation. Respiratory effort normal. Cardiovascular system: S1 & S2 heard, RRR. No JVD, murmurs, rubs, gallops or clicks.  Trace pitting edema bilateral lower extremity Gastrointestinal system: Abdomen is nondistended, soft and nontender. No organomegaly or masses felt. Normal bowel sounds heard. Central nervous  system: Alert and oriented. No focal neurological deficits. Extremities: Symmetric 5 x 5 power. Skin: No rashes, lesions or ulcers.  Psychiatry: Judgement and insight appear normal. Mood & affect appropriate.   Data Reviewed: I have personally reviewed following labs and imaging studies  CBC: Recent Labs  Lab 07/20/21 1743 07/21/21 0211 07/22/21 0705  WBC  --  12.4* 14.7*  NEUTROABS  --   --  12.4*  HGB 13.9 12.5 12.8  HCT 41.0 40.9 40.3  MCV  --  83.8 84.1  PLT  --  259 [id number]    Basic Metabolic Panel: Recent Labs  Lab 07/20/21 1903 07/21/21 0211 07/22/21 0705 07/23/21 0125 07/24/21 0306  NA 137 133* 136 134* 135  K 3.4* 3.9 4.1 3.3* 3.7  CL 97* 95* 96* 96* 96*  CO2 28 27 30 28 30   GLUCOSE 103* 155* 152* 103* 135*  BUN 14 14 22* 23* 15  CREATININE 0.78 0.78 0.90 0.67 0.70  CALCIUM 8.6* 8.4* 8.6* 8.6* 8.4*  MG 2.2 2.4 2.8* 2.5* 2.1  PHOS 5.6*  --   --   --   --     GFR: Estimated Creatinine Clearance: 113.2 mL/min (by C-G formula based on SCr of 0.7 mg/dL). Liver Function Tests: Recent Labs  Lab 07/20/21 1903  ALBUMIN 3.4*    No results for input(s): LIPASE, AMYLASE in the last 168 hours. No results for input(s): AMMONIA in the last 168 hours. Coagulation Profile: No results for input(s): INR, PROTIME in the last 168 hours. Cardiac Enzymes: No results for input(s): CKTOTAL, CKMB, CKMBINDEX, TROPONINI in the last 168 hours. BNP (last 3 results) No results for input(s): PROBNP in the last 8760 hours. HbA1C: No results for input(s): HGBA1C in the last 72 hours. CBG: Recent Labs  Lab 07/23/21 1628 07/23/21 1925 07/23/21 2317 07/24/21 0313 07/24/21 0815  GLUCAP 154* 125* 158* 125* 172*    Lipid Profile: No results for input(s): CHOL, HDL, LDLCALC, TRIG, CHOLHDL, LDLDIRECT in the last 72 hours.  Thyroid Function Tests: No results for input(s): TSH, T4TOTAL, FREET4, T3FREE, THYROIDAB in the last 72 hours. Anemia Panel: No results for input(s):  VITAMINB12, FOLATE, FERRITIN, TIBC, IRON, RETICCTPCT in the last 72  hours. Sepsis Labs: Recent Labs  Lab 07/18/21 0346 07/23/21 0125  PROCALCITON <0.10 <0.10     Recent Results (from the past 240 hour(s))  Resp Panel by RT-PCR (Flu A&B, Covid) Nasopharyngeal Swab     Status: None   Collection Time: 07/17/21  7:12 AM   Specimen: Nasopharyngeal Swab; Nasopharyngeal(NP) swabs in vial transport medium  Result Value Ref Range Status   SARS Coronavirus 2 by RT PCR NEGATIVE NEGATIVE Final    Comment: (NOTE) SARS-CoV-2 target nucleic acids are NOT DETECTED.  The SARS-CoV-2 RNA is generally detectable in upper respiratory specimens during the acute phase of infection. The lowest concentration of SARS-CoV-2 viral copies this assay can detect is 138 copies/mL. A negative result does not preclude SARS-Cov-2 infection and should not be used as the sole basis for treatment or other patient management decisions. A negative result may occur with  improper specimen collection/handling, submission of specimen other than nasopharyngeal swab, presence of viral mutation(s) within the areas targeted by this assay, and inadequate number of viral copies(<138 copies/mL). A negative result must be combined with clinical observations, patient history, and epidemiological information. The expected result is Negative.  Fact Sheet for Patients:  EntrepreneurPulse.com.au  Fact Sheet for Healthcare Providers:  IncredibleEmployment.be  This test is no t yet approved or cleared by the [place] FDA and  has been authorized for detection and/or diagnosis of SARS-CoV-2 by FDA under an Emergency Use Authorization (EUA). This EUA will remain  in effect (meaning this test can be used) for the duration of the COVID-19 declaration under Section 564(b)(1) of the Act, 21 U.S.C.section 360bbb-3(b)(1), unless the authorization is terminated  or revoked sooner.        Influenza A by PCR NEGATIVE NEGATIVE Final   Influenza B by PCR NEGATIVE NEGATIVE Final    Comment: (NOTE) The Xpert Xpress SARS-CoV-2/FLU/RSV plus assay is intended as an aid in the diagnosis of influenza from Nasopharyngeal swab specimens and should not be used as a sole basis for treatment. Nasal washings and aspirates are unacceptable for Xpert Xpress SARS-CoV-2/FLU/RSV testing.  Fact Sheet for Patients: EntrepreneurPulse.com.au  Fact Sheet for Healthcare Providers: IncredibleEmployment.be  This test is not yet approved or cleared by the [place] FDA and has been authorized for detection and/or diagnosis of SARS-CoV-2 by FDA under an Emergency Use Authorization (EUA). This EUA will remain in effect (meaning this test can be used) for the duration of the COVID-19 declaration under Section 564(b)(1) of the Act, 21 U.S.C. section 360bbb-3(b)(1), unless the authorization is terminated or revoked.  Performed at [facility], [address], [place], [place] [address]   MRSA Next Gen by PCR, Nasal     Status: Abnormal   Collection Time: 07/17/21  5:55 PM   Specimen: Nasal Mucosa; Nasal Swab  Result Value Ref Range Status   MRSA by PCR Next Gen DETECTED (A) NOT DETECTED Final    Comment: RESULT CALLED TO, READ BACK BY AND VERIFIED WITH: Shaune Spittle RN [phone] K FORSYTH (NOTE) The GeneXpert MRSA Assay (FDA approved for NASAL specimens only), is one component of a comprehensive MRSA colonization surveillance program. It is not intended to diagnose MRSA infection nor to guide or monitor treatment for MRSA infections. Test performance is not FDA approved in patients less than 73 years old. Performed at [facility], [address] [place], [place] [address]   Culture, Respiratory w Gram Stain     Status: None   Collection Time: 07/21/21  8:26 AM  Specimen: Tracheal Aspirate; Respiratory  Result Value Ref Range Status   Specimen Description  TRACHEAL ASPIRATE  Final   Special Requests NONE  Final   Gram Stain   Final    RARE WBC PRESENT,BOTH PMN AND MONONUCLEAR NO ORGANISMS SEEN    Culture   Final    RARE Normal respiratory flora-no Staph aureus or Pseudomonas seen Performed at [facility], [address] [place], [place] [address]    Report Status 07/23/2021 FINAL  Final      Radiology Studies: No results found.  Scheduled Meds:  albuterol  2.5 mg Nebulization Q4H   arformoterol  15 mcg Nebulization BID   budesonide (PULMICORT) nebulizer solution  0.5 mg Nebulization BID   busPIRone  7.5 mg Oral BID   Chlorhexidine Gluconate Cloth  6 each Topical Daily   cholecalciferol  5,000 Units Oral Daily   enoxaparin (LOVENOX) injection  60 mg Subcutaneous Daily   furosemide  40 mg Intravenous Daily   hydroxychloroquine  200 mg Oral BID   insulin aspart  0-15 Units Subcutaneous Q4H   levETIRAcetam  500 mg Oral BID   loratadine  10 mg Oral Daily   mouth rinse  15 mL Mouth Rinse BID   montelukast  10 mg Oral QHS   pantoprazole sodium  40 mg Oral Daily   revefenacin  175 mcg Nebulization Daily   senna-docusate  1 tablet Oral BID   Continuous Infusions:   LOS: 7 days   Time spent: 28-minute  Darliss Cheney, MD [facility]  07/24/2021, 11:52 AM  Please page via [facility] and do not message via secure chat for urgent patient care matters. Secure chat can be used for non urgent patient care matters.  How to contact the [facility] Attending or Consulting provider [place] or covering provider during after hours [place], for this patient?  Check the care team in [facility] and look for a) attending/consulting TRH provider listed and b) the [facility] team listed. Page or secure chat [phone]. Log into [email or web address] and use [place]'s universal password to access. If you do not have the password, please contact the hospital operator. Locate the [facility] provider you are looking for under [facility] and page to a number that you can be  directly reached. If you still have difficulty reaching the provider, please page the [facility] (Director on Call) for the Hospitalists listed on amion for assistance.

## 2021-07-24 NOTE — Evaluation (Signed)
Occupational Therapy Evaluation Patient Details Name: Elizabeth Bautista MRN: [id number] DOB: 02-Jul-1975 Today's Date: 07/24/2021   History of Present Illness 46 year old female presented to presented to [facility] ED 2/15 with 3 days of progressive wheezing dyspnea and lightheadedness. placed on continuous albuterol bronchodilator therapy in ED. 07/20/2021, her respiratory status got worse and she needed to be intubated.  After that she was transferred to [facility], ICU.  Extubated on 07/22/2021   PMH: severe persistent allergic asthma with history of multiple hospitalizations for asthma exacerbation in addition to history of intubation from asthma most recently discharged from the hospital in April 2022 for asthma exacerbation.   Clinical Impression    Pt admitted as above with deficits as stated below. PTA, pt lived with son whom works during the day. Daughter assist with bathing and LB dressing/ADL's. She is currently supervision with increased time for bed mobility with HOB elevated and +2 Mod Assist for sit to stand and transfer to chair with RW. Family assist with IADL's at baseline. Pt would benefit from tub bench.   Pt states that she mostly uses rollator for household ambulation, uses transportation for appointments. Functional mobility is limited by day to day pain in L hip. Pt with overall generalized weakness. OT recommending HHOT at this time if family is able to provide necessary assist. If family is unable to provide support, will recommend SNF Rehab.             Recommendations for follow up therapy are one component of a multi-disciplinary discharge planning process, led by the attending physician.  Recommendations may be updated based on patient status, additional functional criteria and insurance authorization.   Follow Up Recommendations  Home health OT (Currently recommending HHOT is pt family able to provide necessary assist. If not, then SNF Rehab prior to return home)     Assistance Recommended at Discharge Frequent or constant Supervision/Assistance  Patient can return home with the following A lot of help with walking and/or transfers;A lot of help with bathing/dressing/bathroom;Assistance with cooking/housework;Assist for transportation;Help with stairs or ramp for entrance    Functional Status Assessment  Patient has had a recent decline in their functional status and demonstrates the ability to make significant improvements in function in a reasonable and predictable amount of time.  Equipment Recommendations  Tub/shower bench    Recommendations for Other Services       Precautions / Restrictions Precautions Precautions: Fall Precaution Comments: During seizures      Mobility Bed Mobility Overal bed mobility: Needs Assistance Bed Mobility: Supine to Sit     Supine to sit: Supervision, Modified independent (Device/Increase time)          Transfers Overall transfer level: Needs assistance Equipment used: Rolling walker (2 wheels) Transfers: Sit to/from Stand, Bed to chair/wheelchair/BSC Sit to Stand: +2 physical assistance, Mod assist Stand pivot transfers: Mod assist, +2 physical assistance         General transfer comment: VC's for safety and sequencing during transfer secondary to h/o LLE weakness/numness/pain      Balance Overall balance assessment: Needs assistance Sitting-balance support: Single extremity supported, No upper extremity supported, Feet supported Sitting balance-Leahy Scale: Fair     Standing balance support: Bilateral upper extremity supported, Reliant on assistive device for balance Standing balance-Leahy Scale: Fair         ADL either performed or assessed with clinical judgement   ADL Overall ADL's : Needs assistance/impaired Eating/Feeding: Set up;Bed level;Sitting   Grooming: Wash/dry hands;Wash/dry face;Set up;Sitting;Bed  level   Upper Body Bathing: Set up;Sitting   Lower Body Bathing:  Moderate assistance;Sitting/lateral leans;Sit to/from stand;+2 for physical assistance Lower Body Bathing Details (indicate cue type and reason): +2 assist for safety during sit to stand Upper Body Dressing : Set up;Minimal assistance;Sitting   Lower Body Dressing: Moderate assistance;+2 for physical assistance;Sitting/lateral leans;Sit to/from stand   Toilet Transfer: +2 for physical assistance;Moderate assistance;Rolling walker (2 wheels);Requires wide/bariatric   Toileting- Clothing Manipulation and Hygiene: Moderate assistance;+2 for physical assistance;Sit to/from stand;Sitting/lateral lean       Functional mobility during ADLs: Moderate assistance;+2 for physical assistance;Rolling walker (2 wheels);Cueing for sequencing;Cueing for safety General ADL Comments: Pt able to stand today with +2 mod assist and transfer from bed to chair. Pt plans to return home with family assist PRN. PTA, pt was assisted for bathing, LB dressing, homemaking by daughter. Pt reports that she has a shower chair that is difficult to transfer into/out of tub, she may benefit from tub bench at home for increased safety. High fall risk secondary to seizures and L hip pain/numbness.     Vision Baseline Vision/History: 1 Wears glasses Patient Visual Report: No change from baseline              Pertinent Vitals/Pain Pain Assessment Pain Assessment: 0-10 Pain Score: 6  Pain Location: R hand/arm with K infusion, L hip nerve pain Pain Descriptors / Indicators: Sharp, Shooting, Constant, Tingling, Numbness Pain Intervention(s): Limited activity within patient's tolerance, Monitored during session, Repositioned, Other (comment) (Issued hot pack for pt to use PRN)     Hand Dominance     Extremity/Trunk Assessment Upper Extremity Assessment Upper Extremity Assessment: Overall WFL for tasks assessed (Grossly 4/5; C/o R forearm pain secondary to K infusion)   Lower Extremity Assessment Lower Extremity  Assessment: Defer to PT evaluation       Communication Communication Communication: No difficulties;Other (comment)   Cognition Arousal/Alertness: Awake/alert Behavior During Therapy: Flat affect Overall Cognitive Status: Within Functional Limits for tasks assessed                  Home Living Family/patient expects to be discharged to:: Private residence Living Arrangements: Children Available Help at Discharge: Family;Available PRN/intermittently Type of Home: House Home Access: Stairs to enter [employer] of Steps: 4 Entrance Stairs-Rails: Left Home Layout: Two level;Bed/bath upstairs;Able to live on main level with bedroom/bathroom Alternate Level Stairs-Number of Steps: 8 Alternate Level Stairs-Rails: Right Bathroom Shower/Tub: Tub/shower unit   Bathroom Toilet: Standard Bathroom Accessibility: Yes   Home Equipment: Cane - single point;Rolling Walker (2 wheels);Rollator (4 wheels);Shower seat          Prior Functioning/Environment Prior Level of Function : Needs assist       Physical Assist : Mobility (physical);ADLs (physical) Mobility (physical): Stairs;Gait ADLs (physical): IADLs;Bathing Mobility Comments: ambulates with SPC, RW and Rollator as back and L hip pain allows ADLs Comments: uses transport company for transportation to appointements, pt needs assist from daughter for some bathing and lower body dressing        OT Problem List: Decreased strength;Decreased activity tolerance;Obesity;Impaired balance (sitting and/or standing);Pain      OT Treatment/Interventions: Self-care/ADL training;Therapeutic exercise;Patient/family education;Therapeutic activities;DME and/or AE instruction    OT Goals(Current goals can be found in the care plan section) Acute Rehab OT Goals Patient Stated Goal: Go home (with family assist PRN) OT Goal Formulation: With patient Time For Goal Achievement: 08/14/21 Potential to Achieve Goals: Good  OT  Frequency: Min 2X/week  AM-PAC OT "6 Clicks" Daily Activity     Outcome Measure Help from another person eating meals?: None Help from another person taking care of personal grooming?: A Little Help from another person toileting, which includes using toliet, bedpan, or urinal?: A Lot Help from another person bathing (including washing, rinsing, drying)?: A Lot Help from another person to put on and taking off regular upper body clothing?: A Little Help from another person to put on and taking off regular lower body clothing?: A Lot 6 Click Score: 16   End of Session Equipment Utilized During Treatment: Gait belt;Rolling walker (2 wheels);Oxygen Nurse Communication: Mobility status  Activity Tolerance: Patient tolerated treatment well Patient left: in chair;with call bell/phone within reach;with nursing/sitter in room;with family/visitor present  OT Visit Diagnosis: Muscle weakness (generalized) (M62.81);Unsteadiness on feet (R26.81);Pain Pain - Right/Left: Left Pain - part of body: Hip                Time: [id number] OT Time Calculation (min): 31 min Charges:  OT General Charges $OT Visit: 1 Visit OT Evaluation $OT Eval Low Complexity: 1 Low OT Treatments $Therapeutic Activity: 8-22 mins  Thuan Tippett Beth Dixon, OTR/L 07/24/2021, 10:16 AM

## 2021-07-25 LAB — BASIC METABOLIC PANEL
Anion gap: 8 (ref 5–15)
BUN: 14 mg/dL (ref 6–20)
CO2: 33 mmol/L — ABNORMAL HIGH (ref 22–32)
Calcium: 8.7 mg/dL — ABNORMAL LOW (ref 8.9–10.3)
Chloride: 94 mmol/L — ABNORMAL LOW (ref 98–111)
Creatinine, Ser: 0.74 mg/dL (ref 0.44–1.00)
GFR, Estimated: >60 mL/min (ref >60)
Glucose, Bld: 125 mg/dL — ABNORMAL HIGH (ref 70–99)
Potassium: 3.2 mmol/L — ABNORMAL LOW (ref 3.5–5.1)
Sodium: 135 mmol/L (ref 135–145)

## 2021-07-25 LAB — GLUCOSE, CAPILLARY
Glucose-Capillary: 137 mg/dL — ABNORMAL HIGH (ref 70–99)
Glucose-Capillary: 134 mg/dL — ABNORMAL HIGH (ref 70–99)
Glucose-Capillary: 197 mg/dL — ABNORMAL HIGH (ref 70–99)

## 2021-07-25 LAB — MAGNESIUM: Magnesium: 2.2 mg/dL (ref 1.7–2.4)

## 2021-07-25 MED ORDER — REVEFENACIN 175 MCG/3ML IN SOLN
175.0000 ug | Freq: Every day | RESPIRATORY_TRACT | 0 refills | Status: DC
Start: 2021-07-26 — End: 2022-03-07

## 2021-07-25 MED ORDER — ALBUTEROL SULFATE (2.5 MG/3ML) 0.083% IN NEBU
2.5000 mg | INHALATION_SOLUTION | Freq: Three times a day (TID) | RESPIRATORY_TRACT | Status: DC
  Administered 2021-07-25: 2.5 mg via RESPIRATORY_TRACT
  Filled 2021-07-25: qty 3

## 2021-07-25 MED ORDER — PANTOPRAZOLE SODIUM 40 MG PO TBEC
40.0000 mg | DELAYED_RELEASE_TABLET | Freq: Every day | ORAL | Status: DC
  Administered 2021-07-25: 40 mg via ORAL
  Filled 2021-07-25: qty 1

## 2021-07-25 MED ORDER — PANTOPRAZOLE SODIUM 40 MG PO TBEC: 40.0000 mg | DELAYED_RELEASE_TABLET | Freq: Every day | ORAL | 0 refills | Status: DC

## 2021-07-25 NOTE — Discharge Summary (Signed)
Physician Discharge Summary  [facility] RWE:[id number] DOB: 01/08/1976 DOA: 07/17/2021  PCP: Elizabeth Spar, MD  Admit date: 07/17/2021 Discharge date: 07/25/2021  Time spent: 45 minutes  Recommendations for Outpatient Follow-up:  Follow-up with primary care physician in 1 to 2 weeks Follow-up pulmonologist 2 to 4 weeks  Discharge Diagnoses:  Principal Problem:   Severe asthma with exacerbation Active Problems:   OSA on CPAP   Seropositive rheumatoid arthritis ([facility])   Class 3 severe obesity with serious comorbidity and body mass index (BMI) of 45.0 to 49.9 in adult [facility])   Essential hypertension   Vitamin D deficiency   Type 2 diabetes mellitus with hyperglycemia, without long-term current use of insulin (HCC)   Upper airway cough syndrome   GERD (gastroesophageal reflux disease)   Anxiety   History of seizures   High risk medication use   Body mass index (BMI) 45.0-49.9, adult (HCC)   (HFpEF) heart failure with preserved ejection fraction ([facility])   Acute respiratory failure with hypoxia [facility])   Discharge Condition: Improved and stable   Filed Weights   07/17/21 1749 07/18/21 0600 07/19/21 1624  Weight: 125.6 kg 125.4 kg 124.2 kg    History of present illness:  46 year old female with long history of severe persistent allergic asthma with history of multiple hospitalizations for asthma exacerbation in addition to history of intubation from asthma. She presented to the [facility] emergency department with 3 days of progressive wheezing dyspnea and lightheadedness.  She has been seen by Dr. Halford Chessman in pulmonology who reported that breztri was not effective for her and yupelri was too expensive. She had been placed on a trial of Trelegy 200 one puff daily at her last office visit.  She said that she had been doing fairly well on trelegy until the weather started warming up and pollen counts began to rise.  She has OSA and is compliant with nightly CPAP.  She also has rheumatoid arthritis and  has been on Plaquenil in the past and followed by Dr. Vernelle [facility] with [facility].  She also has morbid obesity and has had [facility].     Pt was placed on continuous albuterol bronchodilator therapy in ED on arrival.  She had some improvement but not back to baseline and with her history of rapid decompensation it was thought best practice that she be monitored in [facility] under observation.  Her CXR did not show any findings of pneumonia.  Her sars 2 coronavirus testing was negative.  Subsequently on 07/20/2021, her respiratory status got worse and she needed to be intubated.  After that she was transferred to [facility], ICU.  Extubated on 07/22/2021 and transferred under [place] on 07/23/2021.  [facility] Course:  Acute hypoxic respiratory failure secondary to severe asthma with exacerbation- (present on admission): Patient needed to be intubated, finally extubated on 07/22/2021.  Details as above.  Currently on 3 L but still has some shortness of breath.  Wheezy on exam. Continue bronchodilators, Brovana, Yupelri, Pulmicort, Claritin and Singulair.  We will try to wean.  In order to keep her dry, we will continue IV Lasix for another day. Patient weaned down to room air since yesterday with normal O2 sats. Was on steroids 2/15 through 2/19.  Continue nocturnal CPAP -[name] optimizing GERD asthma control OSA Sputum culture negative.  Procalcitonin unremarkable.  She is afebrile.  Will discontinue antibiotics.   History of seizures: Continue Keppra 500 mg p.o. twice  daily.   Anxiety- (present on admission): Continue home medications.   GERD (gastroesophageal reflux disease)- (present on admission): Continue Protonix.   Type 2 diabetes mellitus with hyperglycemia, without long-term current use of insulin ([facility])- (present on admission): Hemoglobin A1c 5.8 on 07/17/2021.  Resume metformin at discharge  Essential hypertension-  (present on admission): Resume home meds  Class 3 severe obesity with serious comorbidity and body mass index (BMI) of 45.0 to 49.9 in adult [facility]) - Pt had plans to follow up with [facility].    Seropositive rheumatoid arthritis ([facility])- (present on admission) - She is followed by an outpatient rheumatologist - Resume home plaquenil therapy    OSA on CPAP -continue nightly CPAP therapy - she has been compliant using in [facility]   Nausea: As needed Zofran.   Disposition: Has been seen by PT OT who recommended SNF.  Patient is very adamant that she wants to go home.  Her sister was at the bedside who was also trying to convince her to agree to go to SNF.  Patient remained firm and her decision.  Many times patient offered short-term rehab which she continued to refuse.  Patient being discharged home with home health and physical therapy.  Discharge Exam: Vitals:   07/25/21 1218 07/25/21 1442  BP: 114/77   Pulse: 86 88  Resp: 18 14  Temp: 98.6 F (37 C)   SpO2: 90%     General: Alert and oriented no apparent distress Cardiovascular: Regular rate and rhythm without murmurs rubs or gallops Respiratory: Clear to auscultation bilaterally no wheezes rhonchi or rales  Discharge Instructions   Discharge Instructions     Ambulatory referral to Physical Therapy   Complete by: As directed    Diet - low sodium heart healthy   Complete by: As directed    Increase activity slowly   Complete by: As directed       Allergies as of 07/25/2021       Reactions   Ferumoxytol Anaphylaxis   ~5 years ago. Tolerated IV venofer   Phenytoin Anaphylaxis   Other reaction(s): swelling and heart rate decreased   Pecan Nut (diagnostic) Other (See Comments)   Phenylbutazones Other (See Comments)        Medication List     STOP taking these medications    amoxicillin-clavulanate 875-125 MG tablet Commonly known as: AUGMENTIN       TAKE these medications     albuterol 108 (90 Base) MCG/ACT inhaler Commonly known as: ProAir HFA 2 puffs every 4 hours as needed only  if your can't catch your breath What changed:  how much to take how to take this when to take this reasons to take this additional instructions   albuterol (2.5 MG/3ML) 0.083% nebulizer solution Commonly known as: PROVENTIL Take 3 mLs (2.5 mg total) by nebulization every 6 (six) hours as needed for wheezing or shortness of breath. What changed: Another medication with the same name was changed. Make sure you understand how and when to take each.   azelastine 0.1 % nasal spray Commonly known as: ASTELIN Place 1 spray into both nostrils 2 (two) times daily. Use in each nostril as directed   blood glucose meter kit and supplies Dispense based on patient and insurance preference. Use up to four times daily as directed. (FOR ICD-10 E10.9, E11.9).   busPIRone 7.5 MG tablet Commonly known as: BUSPAR Take 7.5 mg by mouth 2 (two) times daily.   cetirizine 10 MG tablet Commonly known  as: ZYRTEC Take 1 tablet (10 mg total) by mouth daily.   Cholecalciferol 1.25 MG (50000 UT) Tabs Take 1 tablet by mouth daily.   cyclobenzaprine 10 MG tablet Commonly known as: FLEXERIL Take 10 mg by mouth 3 (three) times daily as needed for muscle spasms.   DULoxetine 60 MG capsule Commonly known as: CYMBALTA Take 60 mg by mouth daily.   EPINEPHrine 0.3 mg/0.3 mL Soaj injection Commonly known as: EPI-PEN Inject 0.3 mg into the muscle as needed for anaphylaxis.   escitalopram 20 MG tablet Commonly known as: LEXAPRO Take 1 tablet by mouth every evening.   famotidine 20 MG tablet Commonly known as: PEPCID Take 20 mg by mouth daily.   ferrous sulfate 325 (65 FE) MG tablet TAKE 1 TABLET BY MOUTH TWICE A DAY   fluticasone 50 MCG/ACT nasal spray Commonly known as: FLONASE SPRAY 1 SPRAY INTO BOTH NOSTRILS DAILY.   hydrochlorothiazide 25 MG tablet Commonly known as: HYDRODIURIL Take  1 tablet (25 mg total) by mouth daily. For high blood pressure.   hydroxychloroquine 200 MG tablet Commonly known as: PLAQUENIL TAKE 1 TABLET BY MOUTH TWICE A DAY   levETIRAcetam 500 MG tablet Commonly known as: Keppra Take 1 tablet (500 mg total) by mouth 2 (two) times daily.   metFORMIN 500 MG 24 hr tablet Commonly known as: Glucophage XR Take 1 tablet (500 mg total) by mouth daily with breakfast.   montelukast 10 MG tablet Commonly known as: SINGULAIR Take 1 tablet (10 mg total) by mouth at bedtime.   pantoprazole 40 MG tablet Commonly known as: Protonix Take 1 tablet (40 mg total) by mouth daily. Take 30-60 min before first meal of the day What changed: Another medication with the same name was added. Make sure you understand how and when to take each.   pantoprazole 40 MG tablet Commonly known as: PROTONIX Take 1 tablet (40 mg total) by mouth daily. Start taking on: July 26, 2021 What changed: You were already taking a medication with the same name, and this prescription was added. Make sure you understand how and when to take each.   pregabalin 150 MG capsule Commonly known as: LYRICA Take 1 capsule (150 mg total) by mouth 2 (two) times daily.   promethazine 25 MG tablet Commonly known as: PHENERGAN Take 1 tablet (25 mg total) by mouth every 6 (six) hours as needed for nausea or vomiting.   QUEtiapine 100 MG tablet Commonly known as: SEROQUEL Take 50-100 mg by mouth at bedtime.   revefenacin 175 MCG/3ML nebulizer solution Commonly known as: YUPELRI Take 3 mLs (175 mcg total) by nebulization daily. Start taking on: July 26, 2021   Sphygmomanometer Misc 1 each by Does not apply route daily.   SUMAtriptan 50 MG tablet Commonly known as: IMITREX TAKE 1 TABLET EVERY 2 HOURS AS NEEDED FOR MIGRAINE OR HEADACHE.   traZODone 100 MG tablet Commonly known as: DESYREL Take 100 mg by mouth at bedtime.   Trelegy Ellipta 200-62.5-25 MCG/ACT Aepb Generic drug:  Fluticasone-Umeclidin-Vilant Inhale 1 puff into the lungs daily.   triamcinolone ointment 0.5 % Commonly known as: KENALOG Apply 1 application topically as needed.   triamcinolone 0.025 % ointment Commonly known as: KENALOG APPLY TO AFFECTED AREA TWICE A DAY               Durable Medical Equipment  (From admission, onward)           Start     Ordered   07/25/21 1108  For home use only DME Other see comment  Once       Comments: Transfer bench  Question:  Length of Need  Answer:  Lifetime   07/25/21 1107           Allergies  Allergen Reactions   Ferumoxytol Anaphylaxis    ~5 years ago. Tolerated IV venofer   Phenytoin Anaphylaxis    Other reaction(s): swelling and heart rate decreased   Pecan Nut (Diagnostic) Other (See Comments)   Phenylbutazones Other (See Comments)    Follow-up Information     [facility]. Schedule an appointment as soon as possible for a visit.   Specialty: Rehabilitation Why: Call for an appointment for physical therapy Contact information: [place] [phone] [address] [place] [phone]                 The results of significant diagnostics from this hospitalization (including imaging, microbiology, ancillary and laboratory) are listed below for reference.    Significant Diagnostic Studies: DG Chest 1V REPEAT Same Day  Result Date: 07/20/2021 CLINICAL DATA:  Post intubation. Earlier same day radiograph demonstrated right mainstem bronchus intubation. EXAM: CHEST - 1 VIEW SAME DAY COMPARISON:  Earlier same day radiograph at 1402 hours FINDINGS: Of note, the left costophrenic angle is not included on the field of view. Interval retraction of the endotracheal tube with the tip now projecting approximately 0.9 cm above the carina. Enteric tube tip is below the diaphragm but not included on the image. Unchanged left basilar opacity. Thin linear subsegmental  atelectasis in the left upper and mid lung. The right lung is clear. No pneumothorax. IMPRESSION: Interval retraction of the endotracheal tube with the tip now projecting approximately 0.9 cm above the carina. Remainder of the exam is stable. Electronically Signed   By: Ileana Roup M.D.   On: 07/20/2021 15:40   DG Chest 1V REPEAT Same Day  Result Date: 07/20/2021 CLINICAL DATA:  Status post intubation for shortness of breath. History of hypertension and diabetes. EXAM: CHEST - 1 VIEW SAME DAY COMPARISON:  Earlier today FINDINGS: The ET tube tip is in the right mainstem bronchus. Recommend withdrawing by at least 2 cm. Nasogastric tube courses below the level of the diaphragms with tip below the field of view. Left midlung and left base opacities are new from the previous exam. Right lung appears clear. IMPRESSION: 1. Right mainstem bronchus intubation. Recommend withdrawing by at least 2 cm. 2. New left midlung and left base opacities compatible with atelectasis and/or airspace consolidation. Electronically Signed   By: Kerby Moors M.D.   On: 07/20/2021 14:15   DG Chest 1V REPEAT Same Day  Result Date: 07/17/2021 CLINICAL DATA:  Follow-up possible foreign body. EXAM: CHEST - 1 VIEW SAME DAY COMPARISON:  Chest x-ray from same day. FINDINGS: Previously seen radiopaque density in the superior mediastinum is no longer identified. Unchanged mild cardiomegaly. Normal pulmonary vascularity. No focal consolidation, pleural effusion, or pneumothorax. No acute osseous abnormality. IMPRESSION: 1. Previously seen radiopaque density in the superior mediastinum is no longer identified, consistent with external location. 2. No active disease. Electronically Signed   By: Titus Dubin M.D.   On: 07/17/2021 08:11   DG Chest Port 1 View  Result Date: 07/21/2021 CLINICAL DATA:  ET tube present EXAM: PORTABLE CHEST 1 VIEW COMPARISON:  07/20/2021 FINDINGS: Endotracheal tube overlies the trachea proximally 2.0 cm above  the carina. Low lung volumes. Bibasilar airspace opacities left greater than  right, not significant changed from prior. Probable [facility] left effusion. No visible pneumothorax. No acute osseous abnormality. IMPRESSION: Endotracheal tube tip is approximately 2.0 cm above the carina. Bibasilar airspace opacities, left greater than right, not significantly changed from prior exam. Electronically Signed   By: Maurine Simmering M.D.   On: 07/21/2021 08:31   DG Chest Port 1 View  Result Date: 07/20/2021 CLINICAL DATA:  Ventilator dependence. EXAM: PORTABLE CHEST 1 VIEW COMPARISON:  Earlier same day FINDINGS: 1741 hours. Endotracheal tube tip is 3.7 cm above the base of the carina. The NG tube passes into the stomach although the distal tip position is not included on the film. Low lung volumes with retrocardiac left base collapse/consolidation. Cardiopericardial silhouette is at upper limits of normal for size. Telemetry leads overlie the chest. IMPRESSION: 1. Endotracheal tube tip is 3.7 cm above the base of the carina. 2. Low volume film with retrocardiac left base collapse/consolidation. Electronically Signed   By: Misty Stanley M.D.   On: 07/20/2021 17:56   DG CHEST PORT 1 VIEW  Result Date: 07/20/2021 CLINICAL DATA:  Shortness of breath, chest pain EXAM: PORTABLE CHEST 1 VIEW COMPARISON:  07/17/2021 FINDINGS: Cardiomegaly. Both lungs are clear. The visualized skeletal structures are unremarkable. IMPRESSION: Cardiomegaly without acute abnormality of the lungs in [facility] portable projection. Electronically Signed   By: Delanna Ahmadi M.D.   On: 07/20/2021 10:35   DG Chest Portable 1 View  Result Date: 07/17/2021 CLINICAL DATA:  Dizziness scratch set dyspnea.  Shortness of breath. EXAM: PORTABLE CHEST 1 VIEW COMPARISON:  09/27/20 FINDINGS: Indeterminate radiodensity in the projection of the superior mediastinum measures 1 cm in length. The exact location cannot be determined based on single view of the chest. Stable  cardiac enlargement. No pleural effusion or edema. No airspace opacities identified. Visualized osseous structures appear unremarkable. IMPRESSION: 1. Stable cardiac enlargement. 2. Indeterminate radiodensity in the projection of the superior mediastinum. Although this may be external to the patient, this cannot be confirmed on single frontal projection of the chest. Correlation with physical exam findings. If there is a clinical concern for ingested or aspirated foreign body lateral chest radiograph may be helpful to determine exact location of this radiodensity. Electronically Signed   By: Kerby Moors M.D.   On: 07/17/2021 07:39   ECHOCARDIOGRAM COMPLETE  Result Date: 07/21/2021    ECHOCARDIOGRAM REPORT   Patient Name:   Elizabeth [facility] Date of Exam: 07/21/2021 Medical Rec #:  [id number]  Height:       64.0 in Accession #:    [id number] Weight:       273.8 lb Date of Birth:  Feb 28, 1976 BSA:          2.236 m Patient Age:    46 years   BP:           152/75 mmHg Patient Gender: F          HR:           87 bpm. Exam Location:  Inpatient Procedure: 2D Echo, Cardiac Doppler and Color Doppler Indications:    Dyspnea  History:        Patient has prior history of Echocardiogram examinations, most                 recent 01/24/2020. Risk Factors:Sleep Apnea and Former Smoker.  Sonographer:    Clayton Lefort RDCS (AE) Referring Phys: [id number] Murlean Iba  Sonographer Comments: Patient is morbidly obese, echo performed with patient supine and on artificial  respirator and no subcostal window. Image acquisition challenging due to patient body habitus. IMPRESSIONS  1. Left ventricular ejection fraction, by estimation, is 60 to 65%. The left ventricle has normal function. The left ventricle has no regional wall motion abnormalities. There is moderate left ventricular hypertrophy. Left ventricular diastolic parameters are consistent with Grade I diastolic dysfunction (impaired relaxation).  2. Right ventricular systolic function is  normal. The right ventricular size is normal. Tricuspid regurgitation signal is inadequate for assessing PA pressure.  3. The mitral valve is normal in structure. No evidence of mitral valve regurgitation. No evidence of mitral stenosis.  4. The aortic valve is tricuspid. There is mild calcification of the aortic valve. There is mild thickening of the aortic valve. Aortic valve regurgitation is mild. No aortic stenosis is present. FINDINGS  Left Ventricle: Left ventricular ejection fraction, by estimation, is 60 to 65%. The left ventricle has normal function. The left ventricle has no regional wall motion abnormalities. The left ventricular internal cavity size was normal in size. There is  moderate left ventricular hypertrophy. Left ventricular diastolic parameters are consistent with Grade I diastolic dysfunction (impaired relaxation). Indeterminate filling pressures. Right Ventricle: The right ventricular size is normal. No increase in right ventricular wall thickness. Right ventricular systolic function is normal. Tricuspid regurgitation signal is inadequate for assessing PA pressure. Left [facility]: Left atrial size was normal in size. Right [facility]: Right atrial size was normal in size. Pericardium: The pericardium was not well visualized. Mitral Valve: The mitral valve is normal in structure. There is mild thickening of the mitral valve leaflet(s). There is mild calcification of the mitral valve leaflet(s). Mild mitral annular calcification. No evidence of mitral valve regurgitation. No evidence of mitral valve stenosis. Tricuspid Valve: The tricuspid valve is not well visualized. Tricuspid valve regurgitation is not demonstrated. No evidence of tricuspid stenosis. Aortic Valve: The aortic valve is tricuspid. There is mild calcification of the aortic valve. There is mild thickening of the aortic valve. There is mild aortic valve annular calcification. Aortic valve regurgitation is mild. No aortic stenosis is  present. Aortic valve mean gradient measures 5.0 mmHg. Aortic valve peak gradient measures 9.1 mmHg. Aortic valve area, by VTI measures 3.00 cm. Pulmonic Valve: The pulmonic valve was not well visualized. Pulmonic valve regurgitation is not visualized. No evidence of pulmonic stenosis. Aorta: The aortic root is normal in size and structure. IAS/Shunts: The interatrial septum was not well visualized.  LEFT VENTRICLE PLAX 2D LVIDd:         4.40 cm LVIDs:         3.10 cm LV PW:         1.20 cm LV IVS:        1.30 cm LVOT diam:     2.10 cm LV SV:         73 LV SV Index:   33 LVOT Area:     3.46 cm  RIGHT VENTRICLE RV Basal diam:  3.20 cm RV S prime:     29.00 cm/s TAPSE (M-mode): 3.5 cm LEFT ATRIUM             Index        RIGHT ATRIUM           Index LA diam:        3.60 cm 1.61 cm/m   RA Area:     15.70 cm LA Vol (A2C):   61.1 ml 27.33 ml/m  RA Volume:   38.40 ml  17.18 ml/m  LA Vol (A4C):   53.0 ml 23.71 ml/m LA Biplane Vol: 60.5 ml 27.06 ml/m  AORTIC VALVE AV Area (Vmax):    3.07 cm AV Area (Vmean):   3.01 cm AV Area (VTI):     3.00 cm AV Vmax:           151.00 cm/s AV Vmean:          104.000 cm/s AV VTI:            0.244 m AV Peak Grad:      9.1 mmHg AV Mean Grad:      5.0 mmHg LVOT Vmax:         134.00 cm/s LVOT Vmean:        90.300 cm/s LVOT VTI:          0.211 m LVOT/AV VTI ratio: 0.86  AORTA Ao Root diam: 3.00 cm Ao Asc diam:  3.30 cm  SHUNTS Systemic VTI:  0.21 m Systemic Diam: 2.10 cm Carlyle Dolly MD Electronically signed by Carlyle Dolly MD Signature Date/Time: 07/21/2021/10:59:45 AM    Final     Microbiology: Recent Results (from the past 240 hour(s))  Resp Panel by RT-PCR (Flu A&B, Covid) Nasopharyngeal Swab     Status: None   Collection Time: 07/17/21  7:12 AM   Specimen: Nasopharyngeal Swab; Nasopharyngeal(NP) swabs in vial transport medium  Result Value Ref Range Status   SARS Coronavirus 2 by RT PCR NEGATIVE NEGATIVE Final    Comment: (NOTE) SARS-CoV-2 target nucleic acids are  NOT DETECTED.  The SARS-CoV-2 RNA is generally detectable in upper respiratory specimens during the acute phase of infection. The lowest concentration of SARS-CoV-2 viral copies this assay can detect is 138 copies/mL. A negative result does not preclude SARS-Cov-2 infection and should not be used as the sole basis for treatment or other patient management decisions. A negative result may occur with  improper specimen collection/handling, submission of specimen other than nasopharyngeal swab, presence of viral mutation(s) within the areas targeted by this assay, and inadequate number of viral copies(<138 copies/mL). A negative result must be combined with clinical observations, patient history, and epidemiological information. The expected result is Negative.  Fact Sheet for Patients:  EntrepreneurPulse.com.au  Fact Sheet for Healthcare Providers:  IncredibleEmployment.be  This test is no t yet approved or cleared by the [place] FDA and  has been authorized for detection and/or diagnosis of SARS-CoV-2 by FDA under an Emergency Use Authorization (EUA). This EUA will remain  in effect (meaning this test can be used) for the duration of the COVID-19 declaration under Section 564(b)(1) of the Act, 21 U.S.C.section 360bbb-3(b)(1), unless the authorization is terminated  or revoked sooner.       Influenza A by PCR NEGATIVE NEGATIVE Final   Influenza B by PCR NEGATIVE NEGATIVE Final    Comment: (NOTE) The Xpert Xpress SARS-CoV-2/FLU/RSV plus assay is intended as an aid in the diagnosis of influenza from Nasopharyngeal swab specimens and should not be used as a sole basis for treatment. Nasal washings and aspirates are unacceptable for Xpert Xpress SARS-CoV-2/FLU/RSV testing.  Fact Sheet for Patients: EntrepreneurPulse.com.au  Fact Sheet for Healthcare Providers: IncredibleEmployment.be  This test is not  yet approved or cleared by the [place] FDA and has been authorized for detection and/or diagnosis of SARS-CoV-2 by FDA under an Emergency Use Authorization (EUA). This EUA will remain in effect (meaning this test can be used) for the duration of the COVID-19 declaration under Section 564(b)(1) of the Act, 21  U.S.C. section 360bbb-3(b)(1), unless the authorization is terminated or revoked.  Performed at [facility] [address] [place], [place] [address]   MRSA Next Gen by PCR, Nasal     Status: Abnormal   Collection Time: 07/17/21  5:55 PM   Specimen: Nasal Mucosa; Nasal Swab  Result Value Ref Range Status   MRSA by PCR Next Gen DETECTED (A) NOT DETECTED Final    Comment: RESULT CALLED TO, READ BACK BY AND VERIFIED WITH: Shaune Spittle RN [phone] K FORSYTH (NOTE) The GeneXpert MRSA Assay (FDA approved for NASAL specimens only), is one component of a comprehensive MRSA colonization surveillance program. It is not intended to diagnose MRSA infection nor to guide or monitor treatment for MRSA infections. Test performance is not FDA approved in patients less than 24 years old. Performed at [facility], [address] [place], [place] [address]   Culture, Respiratory w Gram Stain     Status: None   Collection Time: 07/21/21  8:26 AM   Specimen: Tracheal Aspirate; Respiratory  Result Value Ref Range Status   Specimen Description TRACHEAL ASPIRATE  Final   Special Requests NONE  Final   Gram Stain   Final    RARE WBC PRESENT,BOTH PMN AND MONONUCLEAR NO ORGANISMS SEEN    Culture   Final    RARE Normal respiratory flora-no Staph aureus or Pseudomonas seen Performed at [facility], [address], [place], [place] [address]    Report Status 07/23/2021 FINAL  Final     Labs: Basic Metabolic Panel: Recent Labs  Lab 07/20/21 1903 07/21/21 0211 07/22/21 0705 07/23/21 0125 07/24/21 0306 07/25/21 0402  NA 137 133* 136 134* 135 135  K 3.4* 3.9 4.1 3.3* 3.7 3.2*  CL  97* 95* 96* 96* 96* 94*  CO2 28 27 30 28 30  33*  GLUCOSE 103* 155* 152* 103* 135* 125*  BUN 14 14 22* 23* 15 14  CREATININE 0.78 0.78 0.90 0.67 0.70 0.74  CALCIUM 8.6* 8.4* 8.6* 8.6* 8.4* 8.7*  MG 2.2 2.4 2.8* 2.5* 2.1 2.2  PHOS 5.6*  --   --   --   --   --    Liver Function Tests: Recent Labs  Lab 07/20/21 1903  ALBUMIN 3.4*   No results for input(s): LIPASE, AMYLASE in the last 168 hours. No results for input(s): AMMONIA in the last 168 hours. CBC: Recent Labs  Lab 07/20/21 1743 07/21/21 0211 07/22/21 0705  WBC  --  12.4* 14.7*  NEUTROABS  --   --  12.4*  HGB 13.9 12.5 12.8  HCT 41.0 40.9 40.3  MCV  --  83.8 84.1  PLT  --  259 268   Cardiac Enzymes: No results for input(s): CKTOTAL, CKMB, CKMBINDEX, TROPONINI in the last 168 hours. BNP: BNP (last 3 results) Recent Labs    09/27/20 1212 07/18/21 0347  BNP 23.0 63.0    ProBNP (last 3 results) No results for input(s): PROBNP in the last 8760 hours.  CBG: Recent Labs  Lab 07/24/21 1943 07/24/21 2354 07/25/21 0334 07/25/21 0832 07/25/21 1218  GLUCAP 146* 153* 137* 134* 197*       Signed:  Woodruff Skirvin A MD.  [facility] 07/25/2021, 3:07 PM

## 2021-07-25 NOTE — TOC Transition Note (Signed)
Transition of Care [facility]) - CM/SW Discharge Note   Patient Details  Name: Elizabeth [facility] MRN: [id number] Date of Birth: 1975-10-27  Transition of Care [facility]) CM/SW Contact:  Cyndi Bender, RN Phone Number: 07/25/2021, 11:09 AM   Clinical Narrative:    Patient stable for discharge.  Order for home health. Due to living in [place] and having medicaid I have been unable to find Home health for this patient. Patient and MD are aware. Patient is agreeable to go to outpatient rehab. Referral has been made for outpatient rehab. Order for transfer bench. Patient agreeable to use in house provider for DME needs. Spoke to [place] with [employer] and ordered transfer bench to be delivered to room prior to discharge.  Patient states her son or cousin can transport her home today.  Address, Phone number and PCP verified.     Final next level of care: OP Rehab Barriers to Discharge: Barriers Resolved   Patient Goals and CMS Choice Patient states their goals for this hospitalization and ongoing recovery are:: rerturn home CMS Medicare.gov Compare Post Acute Care list provided to:: Patient Choice offered to / list presented to : Patient  Discharge Placement             home          Discharge Plan and Services In-house Referral: Clinical Social Work Discharge Planning Services: CM Consult Post Acute Care Choice:  (outpt therapy rehab)                               Social Determinants of Health (SDOH) Interventions     Readmission Risk Interventions Readmission Risk Prevention Plan 10/01/2020 09/28/2020 03/12/2020  Transportation Screening Complete Complete Complete  HRI or Home Care Consult Complete Complete Complete  Social Work Consult for [place] Planning/Counseling Complete Complete Complete  Palliative Care Screening Not Applicable Not Applicable Not Applicable  Medication Review Press photographer) Complete Complete Complete  Some recent data might be hidden

## 2021-07-25 NOTE — TOC Progression Note (Addendum)
Transition of Care [facility]) - Progression Note    Patient Details  Name: Elizabeth [facility] MRN: [id number] Date of Birth: Jul 09, 1975  Transition of Care [facility]) CM/SW Contact  Mearl Latin, LCSW Phone Number: 07/25/2021, 10:28 AM  Clinical Narrative:    CSW faxed expedited Personal Care Services Form to [facility] (f. [phone]).  CSW received call from [facility] stating they have accepted patient's referral and will reach out to home care agencies regarding availability.    Expected Discharge Plan: Home/Self Care Barriers to Discharge: Barriers Resolved  Expected Discharge Plan and Services Expected Discharge Plan: Home/Self Care In-house Referral: Clinical Social Work Discharge Planning Services: CM Consult Post Acute Care Choice: Home Health Living arrangements for the past 2 months: Single Family Home Expected Discharge Date: 07/25/21                                     Social Determinants of Health (SDOH) Interventions    Readmission Risk Interventions Readmission Risk Prevention Plan 10/01/2020 09/28/2020 03/12/2020  Transportation Screening Complete Complete Complete  HRI or Home Care Consult Complete Complete Complete  Social Work Consult for Recovery Care Planning/Counseling Complete Complete Complete  Palliative Care Screening Not Applicable Not Applicable Not Applicable  Medication Review Oceanographer) Complete Complete Complete  Some recent data might be hidden

## 2021-07-25 NOTE — Progress Notes (Signed)
PT Cancellation Note  Patient Details Name: Elizabeth [facility] MRN: [id number] DOB: 1975-06-29   Cancelled Treatment:    Reason Eval/Treat Not Completed: Other (comment)  Patient has orders to discharge home today (pt has refused SNF as previous PT recommended). She refused PT at this time because she wants to save her strength for going home. Discussed concern re: getting up her 4 steps at entry and 8 additional steps to her room. Patient is not concerned and feels her son will be able to help her up the steps. "I'll just take my time and rest when I need to."    Jerolyn Center, PT Acute Rehabilitation Services  Pager [phone] Office [phone]   Elizabeth Bautista 07/25/2021, 1:29 PM

## 2021-07-26 ENCOUNTER — Telehealth: Payer: Self-pay

## 2021-07-26 NOTE — Progress Notes (Signed)
2/24: CSW received notification that patient is calling stating she did not receive a tub bench. CSW spoke with patient [phone]) and she stated her sister was told she could receive a tub bench but not a shower chair. CSW contacted [employer] to inquire and [employer] reported that patient cannot get tub bench or shower chair since she has received one in the past five years and they are in the same DME category. CSW called patient back and left a voicemail informing her of this information and provided contact number for [employer] if she would like to private pay for one.   Joaquin CourtsNadia Marlaine Arey LCSW, MSW, [facility]

## 2021-07-26 NOTE — Telephone Encounter (Signed)
Transition Care Management Unsuccessful Follow-up Telephone Call  Date of discharge and from where:  07/25/21 [place]  Attempts:  1st Attempt  Reason for unsuccessful TCM follow-up call:  Left voice message

## 2021-07-26 NOTE — Telephone Encounter (Signed)
Transition Care Management Unsuccessful Follow-up Telephone Call  Date of discharge and from where:  07/25/21 [place]  Attempts:  2nd Attempt  Reason for unsuccessful TCM follow-up call:  Left voice message

## 2021-07-30 ENCOUNTER — Telehealth: Payer: Self-pay | Admitting: Pulmonary Disease

## 2021-07-30 NOTE — Telephone Encounter (Signed)
Spoke to patient.  Recent admission 07/17/2021-07/30/2021. Yesterday she developed increased sob with exertion, prod cough with thick clear sputum and wheezing. Using albuterol solution Q4H and trelegy once daily.  She is requesting samples of Yupelri, as it seemed to work better for her.  Denied f/c/s or additional sx.   Dr. Craige Cotta, please advise.

## 2021-07-30 NOTE — Telephone Encounter (Signed)
Spoke to The ServiceMaster CompanyMeagan, she will leave samples up front for pick up in [place] office.  Meagan, please contact patient when samples are ready for pickup.

## 2021-07-30 NOTE — Telephone Encounter (Signed)
Okay to provide samples of yupelri.

## 2021-07-31 NOTE — Telephone Encounter (Signed)
Called and notified patient that yupelri samples are available at [facility] office for pick up. She states she will pick them up today.  ?

## 2021-08-02 ENCOUNTER — Telehealth (INDEPENDENT_AMBULATORY_CARE_PROVIDER_SITE_OTHER): Payer: Medicaid Other | Admitting: Nurse Practitioner

## 2021-08-02 ENCOUNTER — Encounter: Payer: Self-pay | Admitting: Nurse Practitioner

## 2021-08-02 ENCOUNTER — Other Ambulatory Visit: Payer: Self-pay

## 2021-08-02 DIAGNOSIS — Z09 Encounter for follow-up examination after completed treatment for conditions other than malignant neoplasm: Secondary | ICD-10-CM

## 2021-08-02 DIAGNOSIS — J4551 Severe persistent asthma with (acute) exacerbation: Secondary | ICD-10-CM

## 2021-08-02 NOTE — Progress Notes (Signed)
Virtual Visit via Telephone Note ? ?I connected with Elizabeth Bautista @ on 08/02/21 at 1030 by video and verified that I am speaking with the correct person using two identifiers.  I spent 15 minutes on this video encounter. ? ?Location: ?Patient: home ?Provider: office ?  ?I discussed the limitations, risks, security and privacy concerns of performing an evaluation and management service by telephone and the availability of in person appointments. I also discussed with the patient that there may be a patient responsible charge related to this service. The patient expressed understanding and agreed to proceed. ? ? ?History of Present Illness: ?Patient with medical history of obstructive sleep apnea, essential hypertension, class III severe obesity, GERD, anxiety presents for follow-up for hospital admission for severe asthma with exacerbation.  Patient elected for video visit today due to lack of transportation.  Patient was on hospital admission from 07/17/2021 to 07/25/2021 ?She has had multiple hospitalizations for asthma exacerbation in the past patient was intubated and extubated on 07/22/2021 ? ?Pt stated that she is still not feeling 100 % well, she does not have anything to check oxygen with. She started wheezing 3 days ago,SOB and cough comes with wheezing. She wears CPAP at night , she called pulmonary doctor and the gave her some medicines. States that the medications are helping. Has an upcoming appointment with pulmonology on 3/7. Pt states that she does not smoke no one smokes around her.  ?She stated she will start outpatient rehab for PT, she has an appointment with rehab on Monday  ? ?Patient reports that she is taking all her medications as prescribed, she however declined to go over her medication list.  ? ?Observations/Objective: ?Patient is alert oriented x4, patient does not appear to be in respiratory distress during todays visit.  ? ?Assessment and Plan: ?Severe asthma with exacerbation. ?Continue  albuterol nebulizer every 6 hours as needed for wheezing shortness of breath, Trelegy Ellipta 1 puff into the lungs daily,REVEFEBNACIN 137mcg/3ml solution daily.  ?Patient encouraged to maintain close follow-up with pulmonology, she verbalized understanding. ?Patient advised to call this office, the pulmonology if her condition gets worse she verbalized understanding, patient encouraged to go back to the ED if her condition gets worse over the weekend. ? ?Hospital discharge follow-up ?Hospital admission on 07/17/2021 to 07/25/2021 ?She has had multiple hospitalizations for asthma exacerbation in the past patient was intubated and extubated on 07/22/2021 ?Discharge summary reviewed,labs and imaging results reviewed ?Patient has an upcoming appointment with pulmonology on 3/7 ?Has an upcoming appointment with physical therapy next week Monday ?Patient states she is feeling better but not 100% back to baseline ?Currently on room air at home. ? ?Follow Up Instructions: ? ? ?I discussed the assessment and treatment plan with the patient. The patient was provided an opportunity to ask questions and all were answered. The patient agreed with the plan and demonstrated an understanding of the instructions. ?  ?The patient was advised to call back or seek an in-person evaluation if the symptoms worsen or if the condition fails to improve as anticipated.  ?

## 2021-08-02 NOTE — Assessment & Plan Note (Addendum)
Continue albuterol nebulizer every 6 hours as needed for wheezing shortness of breath, Trelegy Ellipta 1 puff into the lungs daily,REVEFEBNACIN 161mcg/3ml solution daily  ?Patient encouraged to maintain close follow-up with pulmonology, she verbalized understanding. ?Patient advised to call this office, the pulmonology if her condition gets worse she verbalized understanding, patient encouraged to go back to the ED if her condition gets worse over the weekend. ? ?

## 2021-08-02 NOTE — Assessment & Plan Note (Signed)
Hospital admission on 07/17/2021 to 07/25/2021 ?She has had multiple hospitalizations for asthma exacerbation in the past patient was intubated and extubated on 07/22/2021 ?Discharge summary reviewed, labs and imaging results reviewed. ?Patient has an upcoming appointment with pulmonology on 3/7 ?Has an upcoming appointment with physical therapy next week Monday ?Patient states she is feeling better but not 100% back to baseline ?Currently on room air at home. ?

## 2021-08-06 ENCOUNTER — Other Ambulatory Visit: Payer: Self-pay

## 2021-08-06 ENCOUNTER — Telehealth: Payer: Self-pay

## 2021-08-06 ENCOUNTER — Other Ambulatory Visit: Payer: Self-pay | Admitting: Physical Medicine & Rehabilitation

## 2021-08-06 ENCOUNTER — Ambulatory Visit (INDEPENDENT_AMBULATORY_CARE_PROVIDER_SITE_OTHER): Payer: Medicaid Other | Admitting: Primary Care

## 2021-08-06 ENCOUNTER — Encounter: Payer: Self-pay | Admitting: Primary Care

## 2021-08-06 ENCOUNTER — Other Ambulatory Visit: Payer: Medicaid Other | Admitting: Women's Health

## 2021-08-06 VITALS — BP 134/74 | HR 96 | Temp 97.9°F | Ht 64.0 in | Wt 279.6 lb

## 2021-08-06 DIAGNOSIS — J4550 Severe persistent asthma, uncomplicated: Secondary | ICD-10-CM

## 2021-08-06 DIAGNOSIS — Z9989 Dependence on other enabling machines and devices: Secondary | ICD-10-CM

## 2021-08-06 DIAGNOSIS — J4540 Moderate persistent asthma, uncomplicated: Secondary | ICD-10-CM

## 2021-08-06 DIAGNOSIS — G4733 Obstructive sleep apnea (adult) (pediatric): Secondary | ICD-10-CM

## 2021-08-06 DIAGNOSIS — J9601 Acute respiratory failure with hypoxia: Secondary | ICD-10-CM | POA: Diagnosis not present

## 2021-08-06 DIAGNOSIS — K219 Gastro-esophageal reflux disease without esophagitis: Secondary | ICD-10-CM

## 2021-08-06 DIAGNOSIS — J301 Allergic rhinitis due to pollen: Secondary | ICD-10-CM

## 2021-08-06 DIAGNOSIS — Z6841 Body Mass Index (BMI) 40.0 and over, adult: Secondary | ICD-10-CM

## 2021-08-06 DIAGNOSIS — M059 Rheumatoid arthritis with rheumatoid factor, unspecified: Secondary | ICD-10-CM

## 2021-08-06 MED ORDER — IPRATROPIUM-ALBUTEROL 0.5-2.5 (3) MG/3ML IN SOLN
3.0000 mL | Freq: Four times a day (QID) | RESPIRATORY_TRACT | 2 refills | Status: DC | PRN
Start: 2021-08-06 — End: 2022-04-15

## 2021-08-06 MED ORDER — CETIRIZINE HCL 10 MG PO TABS: 10.0000 mg | ORAL_TABLET | Freq: Every day | ORAL | 5 refills | Status: DC

## 2021-08-06 MED ORDER — TRELEGY ELLIPTA 200-62.5-25 MCG/ACT IN AEPB: 1.0000 | INHALATION_SPRAY | Freq: Every day | RESPIRATORY_TRACT | 5 refills | Status: DC

## 2021-08-06 MED ORDER — PREGABALIN 225 MG PO CAPS: 225.0000 mg | ORAL_CAPSULE | Freq: Two times a day (BID) | ORAL | 2 refills | Status: DC

## 2021-08-06 MED ORDER — ACETAMINOPHEN 325 MG PO TABS: 650.0000 mg | ORAL_TABLET | Freq: Four times a day (QID) | ORAL | 0 refills | Status: DC | PRN

## 2021-08-06 NOTE — Telephone Encounter (Signed)
Will await forms from lawyer please let her know  ?

## 2021-08-06 NOTE — Patient Instructions (Addendum)
Recommendations: ?Resume Trelegy - take one puff daily (refill sent) ?Stop Yupelri  ?Use ipratropium-albtueril neb every 6 hours as needed for wheezing/shortness of breath  ?Continue Singulair 10mg  at bedtime ?Continue CPAP nightly for sleep apnea ?Strongly encourage weight loss  ?Continue Protonix twice a day and follow GERD diet   ?Continue plaquenil therapy and follow with rheumatology as directed  ?Please follow up with Dr. Dellis Anes  ?You can get oximeter to monitor O2 levels at drug store if insurance does not cover through durable medical equipment store ? ?Orders: ?Change pressure 8-18cm h20 ? ?Referral: ?ENT re: vocal cord dysfunction (consider speech or vocal therapy) ? ?Follow-up: ?4-8 weeks with Dr. Craige Cotta (April 19th or May 1st- surgical clearance for bariatric surgery) ? ? ? ? ?

## 2021-08-06 NOTE — Telephone Encounter (Signed)
Patient aware.

## 2021-08-06 NOTE — Telephone Encounter (Signed)
FYI Patient called said she needs a letter saying not able to work, per Clinical research associate, said lawyer will fax over forms.  ?

## 2021-08-06 NOTE — Telephone Encounter (Signed)
Patient called to state that the Lyrica is not helping her pain. Spoke with patient and she said that she was in the hospital for almost 2 weeks and they were giving her Lyrica and Oxycodone and her pain was still there. She stated someone advised her to call the clinic when she was discharged to let Dr. Letta Pate know. Please advise ?

## 2021-08-06 NOTE — Progress Notes (Signed)
@Patient  ID: Elizabeth Bautista, female    DOB: 1975-10-14, 46 y.o.   MRN: [id number]  Chief Complaint  Patient presents with   Hospitalization [facility] follow up. Pt states she went to the hospital for her asthma.     Referring provider: [name], MD  HPI: 46 year old female, former smoker quit in 2017 (ten-pack-year history).  Past medical history significant for OSA on CPAP, severe persistent asthma, hypertension, GERD, anemia, seizures, type 2 diabetes, rheumatoid arthritis, class III obesity. Followed by Dr. Halford Chessman. Maintained on Trelegy 277mg, Singulair and plaquenil.   08/06/2021- interim hx  Patient presents today for hospital follow-up. She was admitted from 07/17/21-07/25/21 for acute hypoxic respiratory failure secondary to severe asthma with acute exacerbation. She required intubation. Maintained on [place] pulmicort and Yupelri. Weaned to room air. She was compliant with CPAP during hospitalization. She was discharged home, PT/OT recommend SNF but patient refused. She was referred to outpatient physical therapy, she called today to schedule appointment.   She is feeling better, no longer wheezing. She has only been using samples of Yupeli and albuterol. Not currently using Trelegy. She can not afford Yupelri. Not currently wearing her CPAP. States that she feels she is suffocating with use, having trouble sleeping. She normally tolerates CPAP and states that it helps. She is also having some sinus congestion and headache last several days. She is compliant with Singulair. Not using nasal spray or taking any other over the counter antihistamine or tylenol.  Scheduled for baractic surgery in May, she will need pre-op clearance prior.   Airview download 07/07/21-08/05/21 Usage 1/30 days used Average usage days used 2 hours 33 mins Pressure 5-15cm h20 (13cm h20- 95%) AHI 0.7  Allergies  Allergen Reactions   Ferumoxytol Anaphylaxis    ~5 years ago. Tolerated IV venofer    Phenytoin Anaphylaxis    Other reaction(s): swelling and heart rate decreased   Pecan Nut (Diagnostic) Other (See Comments)   Phenylbutazones Other (See Comments)     There is no immunization history on file for this patient.  Past Medical History:  Diagnosis Date   Anemia    Asthma    COVID-19 virus infection 05/16/2020   04/27/20 dx covid-19, not hospitalized   Diabetes ([place]    on Metformin   Eczema    Edema    Heart problem    "something with the arteries on the left side of the heart"; upcoming appt with cardiology for evaluation   HTN (hypertension)    Rheumatoid arthritis ([place]    on Plaquenil    Seizures ([place]    last seizure 2019   Sleep apnea    Sleep apnea     Tobacco History: Social History   Tobacco Use  Smoking Status Former   Years: 10.00   Types: Cigarettes   Quit date: 06/02/2013   Years since quitting: 8.1  Smokeless Tobacco Never  Tobacco Comments   during the 10 years of smoking, smoked 2-3 cigarettes/day   Counseling given: Not Answered Tobacco comments: during the 10 years of smoking, smoked 2-3 cigarettes/day   Outpatient Medications Prior to Visit  Medication Sig Dispense Refill   albuterol (PROAIR HFA) 108 (90 Base) MCG/ACT inhaler 2 puffs every 4 hours as needed only  if your can't catch your breath (Patient taking differently: Inhale 1-2 puffs into the lungs every 4 (four) hours as needed for wheezing or shortness of breath (only if you can't catch your breath).) 18 g 11  azelastine (ASTELIN) 0.1 % nasal spray Place 1 spray into both nostrils 2 (two) times daily. Use in each nostril as directed 30 mL 12   blood glucose meter kit and supplies Dispense based on patient and insurance preference. Use up to four times daily as directed. (FOR ICD-10 E10.9, E11.9). 1 each 0   Blood Pressure Monitoring (SPHYGMOMANOMETER) MISC 1 each by Does not apply route daily. 1 each 0   busPIRone (BUSPAR) 7.5 MG tablet Take 7.5 mg by mouth 2 (two) times  daily.     Cholecalciferol 1.25 MG (50000 UT) TABS Take 1 tablet by mouth daily. 90 tablet 0   cyclobenzaprine (FLEXERIL) 10 MG tablet Take 10 mg by mouth 3 (three) times daily as needed for muscle spasms.      DULoxetine (CYMBALTA) 60 MG capsule Take 60 mg by mouth daily.     EPINEPHrine 0.3 mg/0.3 mL IJ SOAJ injection Inject 0.3 mg into the muscle as needed for anaphylaxis. 1 each 1   escitalopram (LEXAPRO) 20 MG tablet Take 1 tablet by mouth every evening.     famotidine (PEPCID) 20 MG tablet Take 20 mg by mouth daily.     ferrous sulfate 325 (65 FE) MG tablet TAKE 1 TABLET BY MOUTH TWICE A DAY 180 tablet 1   fluticasone (FLONASE) 50 MCG/ACT nasal spray SPRAY 1 SPRAY INTO BOTH NOSTRILS DAILY. 16 mL 5   Fluticasone-Umeclidin-Vilant (TRELEGY ELLIPTA) 200-62.5-25 MCG/INH AEPB Inhale 1 puff into the lungs daily. 60 each 5   hydrochlorothiazide (HYDRODIURIL) 25 MG tablet Take 1 tablet (25 mg total) by mouth daily. For high blood pressure. 90 tablet 1   hydroxychloroquine (PLAQUENIL) 200 MG tablet TAKE 1 TABLET BY MOUTH TWICE A DAY 60 tablet 1   metFORMIN (GLUCOPHAGE XR) 500 MG 24 hr tablet Take 1 tablet (500 mg total) by mouth daily with breakfast. 90 tablet 1   pantoprazole (PROTONIX) 40 MG tablet Take 1 tablet (40 mg total) by mouth daily. Take 30-60 min before first meal of the day 30 tablet 2   pantoprazole (PROTONIX) 40 MG tablet Take 1 tablet (40 mg total) by mouth daily. 30 tablet 0   pregabalin (LYRICA) 225 MG capsule Take 1 capsule (225 mg total) by mouth 2 (two) times daily. 60 capsule 2   promethazine (PHENERGAN) 25 MG tablet Take 1 tablet (25 mg total) by mouth every 6 (six) hours as needed for nausea or vomiting. 30 tablet 1   QUEtiapine (SEROQUEL) 100 MG tablet Take 50-100 mg by mouth at bedtime.     revefenacin (YUPELRI) 175 MCG/3ML nebulizer solution Take 3 mLs (175 mcg total) by nebulization daily. 30 mL 0   SUMAtriptan (IMITREX) 50 MG tablet TAKE 1 TABLET EVERY 2 HOURS AS NEEDED  FOR MIGRAINE OR HEADACHE. 10 tablet 0   traZODone (DESYREL) 100 MG tablet Take 100 mg by mouth at bedtime.     triamcinolone (KENALOG) 0.025 % ointment APPLY TO AFFECTED AREA TWICE A DAY 454 g 0   triamcinolone ointment (KENALOG) 0.5 % Apply 1 application topically as needed. 30 g 0   albuterol (PROVENTIL) (2.5 MG/3ML) 0.083% nebulizer solution Take 3 mLs (2.5 mg total) by nebulization every 6 (six) hours as needed for wheezing or shortness of breath. 360 mL 5   cetirizine (ZYRTEC) 10 MG tablet Take 1 tablet (10 mg total) by mouth daily. 30 tablet 5   levETIRAcetam (KEPPRA) 500 MG tablet Take 1 tablet (500 mg total) by mouth 2 (two) times daily. 60 tablet 2  montelukast (SINGULAIR) 10 MG tablet Take 1 tablet (10 mg total) by mouth at bedtime. 30 tablet 1   No facility-administered medications prior to visit.   Review of Systems  Review of Systems  Constitutional: Negative.   HENT:  Positive for congestion and sinus pressure.   Respiratory:  Negative for chest tightness, shortness of breath and wheezing.   Cardiovascular: Negative.   Neurological:  Positive for headaches.    Physical Exam  BP 134/74 (BP Location: Left Arm, Patient Position: Sitting, Cuff Size: Normal)    Pulse 96    Temp 97.9 F (36.6 C) (Oral)    Ht 5' 4"  (1.626 m)    Wt 279 lb 9.6 oz (126.8 kg)    LMP 07/28/2021 (Exact Date)    SpO2 96%    BMI 47.99 kg/m  Physical Exam Constitutional:      General: She is not in acute distress.    Appearance: Normal appearance. She is obese. She is not ill-appearing.  HENT:     Head: Normocephalic and atraumatic.     Right Ear: Tympanic membrane and ear canal normal. There is no impacted cerumen.     Left Ear: Tympanic membrane and ear canal normal. There is no impacted cerumen.     Mouth/Throat:     Mouth: Mucous membranes are moist.     Pharynx: Oropharynx is clear.  Cardiovascular:     Rate and Rhythm: Normal rate and regular rhythm.  Pulmonary:     Effort: Pulmonary  effort is normal.     Breath sounds: Normal breath sounds. No wheezing, rhonchi or rales.  Musculoskeletal:        General: Normal range of motion.     Cervical back: Normal range of motion and neck supple.     Comments: Amb with rolling walker   Skin:    General: Skin is warm and dry.  Neurological:     General: No focal deficit present.     Mental Status: She is alert and oriented to person, place, and time. Mental status is at baseline.  Psychiatric:        Mood and Affect: Mood normal.        Behavior: Behavior normal.        Thought Content: Thought content normal.        Judgment: Judgment normal.     Lab Results:  CBC    Component Value Date/Time   WBC 14.7 (H) 07/22/2021 0705   RBC 4.79 07/22/2021 0705   HGB 12.8 07/22/2021 0705   HGB 12.9 02/16/2020 1600   HCT 40.3 07/22/2021 0705   HCT 41.9 02/16/2020 1600   PLT 268 07/22/2021 0705   PLT 329 02/16/2020 1600   MCV 84.1 07/22/2021 0705   MCV 72 (L) 02/16/2020 1600   MCH 26.7 07/22/2021 0705   MCHC 31.8 07/22/2021 0705   RDW 17.7 (H) 07/22/2021 0705   RDW 17.7 (H) 02/16/2020 1600   LYMPHSABS 1.2 07/22/2021 0705   LYMPHSABS 2.5 02/16/2020 1600   MONOABS 0.9 07/22/2021 0705   EOSABS 0.0 07/22/2021 0705   EOSABS 0.5 (H) 02/16/2020 1600   BASOSABS 0.0 07/22/2021 0705   BASOSABS 0.1 02/16/2020 1600    BMET    Component Value Date/Time   NA 135 07/25/2021 0402   NA 142 04/16/2020 1153   K 3.2 (L) 07/25/2021 0402   CL 94 (L) 07/25/2021 0402   CO2 33 (H) 07/25/2021 0402   GLUCOSE 125 (H) 07/25/2021 0402   BUN 14  07/25/2021 0402   BUN 7 04/16/2020 1153   CREATININE 0.74 07/25/2021 0402   CREATININE 0.70 12/25/2020 1029   CALCIUM 8.7 (L) 07/25/2021 0402   GFRNONAA >60 07/25/2021 0402   GFRNONAA 104 10/11/2020 1006   GFRAA 121 10/11/2020 1006    BNP    Component Value Date/Time   BNP 63.0 07/18/2021 0347    ProBNP No results found for: PROBNP  Imaging: DG Chest 1V REPEAT Same Day  Result Date:  07/20/2021 CLINICAL DATA:  Post intubation. Earlier same day radiograph demonstrated right mainstem bronchus intubation. EXAM: CHEST - 1 VIEW SAME DAY COMPARISON:  Earlier same day radiograph at 1402 hours FINDINGS: Of note, the left costophrenic angle is not included on the field of view. Interval retraction of the endotracheal tube with the tip now projecting approximately 0.9 cm above the carina. Enteric tube tip is below the diaphragm but not included on the image. Unchanged left basilar opacity. Thin linear subsegmental atelectasis in the left upper and mid lung. The right lung is clear. No pneumothorax. IMPRESSION: Interval retraction of the endotracheal tube with the tip now projecting approximately 0.9 cm above the carina. Remainder of the exam is stable. Electronically Signed   By: Ileana Roup M.D.   On: 07/20/2021 15:40   DG Chest 1V REPEAT Same Day  Result Date: 07/20/2021 CLINICAL DATA:  Status post intubation for shortness of breath. History of hypertension and diabetes. EXAM: CHEST - 1 VIEW SAME DAY COMPARISON:  Earlier today FINDINGS: The ET tube tip is in the right mainstem bronchus. Recommend withdrawing by at least 2 cm. Nasogastric tube courses below the level of the diaphragms with tip below the field of view. Left midlung and left base opacities are new from the previous exam. Right lung appears clear. IMPRESSION: 1. Right mainstem bronchus intubation. Recommend withdrawing by at least 2 cm. 2. New left midlung and left base opacities compatible with atelectasis and/or airspace consolidation. Electronically Signed   By: Kerby Moors M.D.   On: 07/20/2021 14:15   DG Chest 1V REPEAT Same Day  Result Date: 07/17/2021 CLINICAL DATA:  Follow-up possible foreign body. EXAM: CHEST - 1 VIEW SAME DAY COMPARISON:  Chest x-ray from same day. FINDINGS: Previously seen radiopaque density in the superior mediastinum is no longer identified. Unchanged mild cardiomegaly. Normal pulmonary vascularity.  No focal consolidation, pleural effusion, or pneumothorax. No acute osseous abnormality. IMPRESSION: 1. Previously seen radiopaque density in the superior mediastinum is no longer identified, consistent with external location. 2. No active disease. Electronically Signed   By: Titus Dubin M.D.   On: 07/17/2021 08:11   DG Chest Port 1 View  Result Date: 07/21/2021 CLINICAL DATA:  ET tube present EXAM: PORTABLE CHEST 1 VIEW COMPARISON:  07/20/2021 FINDINGS: Endotracheal tube overlies the trachea proximally 2.0 cm above the carina. Low lung volumes. Bibasilar airspace opacities left greater than right, not significant changed from prior. Probable trace left effusion. No visible pneumothorax. No acute osseous abnormality. IMPRESSION: Endotracheal tube tip is approximately 2.0 cm above the carina. Bibasilar airspace opacities, left greater than right, not significantly changed from prior exam. Electronically Signed   By: Maurine Simmering M.D.   On: 07/21/2021 08:31   DG Chest Port 1 View  Result Date: 07/20/2021 CLINICAL DATA:  Ventilator dependence. EXAM: PORTABLE CHEST 1 VIEW COMPARISON:  Earlier same day FINDINGS: 1741 hours. Endotracheal tube tip is 3.7 cm above the base of the carina. The NG tube passes into the stomach although the distal tip  position is not included on the film. Low lung volumes with retrocardiac left base collapse/consolidation. Cardiopericardial silhouette is at upper limits of normal for size. Telemetry leads overlie the chest. IMPRESSION: 1. Endotracheal tube tip is 3.7 cm above the base of the carina. 2. Low volume film with retrocardiac left base collapse/consolidation. Electronically Signed   By: Misty Stanley M.D.   On: 07/20/2021 17:56   DG CHEST PORT 1 VIEW  Result Date: 07/20/2021 CLINICAL DATA:  Shortness of breath, chest pain EXAM: PORTABLE CHEST 1 VIEW COMPARISON:  07/17/2021 FINDINGS: Cardiomegaly. Both lungs are clear. The visualized skeletal structures are unremarkable.  IMPRESSION: Cardiomegaly without acute abnormality of the lungs in AP portable projection. Electronically Signed   By: Delanna Ahmadi M.D.   On: 07/20/2021 10:35   DG Chest Portable 1 View  Result Date: 07/17/2021 CLINICAL DATA:  Dizziness scratch set dyspnea.  Shortness of breath. EXAM: PORTABLE CHEST 1 VIEW COMPARISON:  09/27/20 FINDINGS: Indeterminate radiodensity in the projection of the superior mediastinum measures 1 cm in length. The exact location cannot be determined based on single view of the chest. Stable cardiac enlargement. No pleural effusion or edema. No airspace opacities identified. Visualized osseous structures appear unremarkable. IMPRESSION: 1. Stable cardiac enlargement. 2. Indeterminate radiodensity in the projection of the superior mediastinum. Although this may be external to the patient, this cannot be confirmed on single frontal projection of the chest. Correlation with physical exam findings. If there is a clinical concern for ingested or aspirated foreign body lateral chest radiograph may be helpful to determine exact location of this radiodensity. Electronically Signed   By: Kerby Moors M.D.   On: 07/17/2021 07:39   ECHOCARDIOGRAM COMPLETE  Result Date: 07/21/2021    ECHOCARDIOGRAM REPORT   Patient Name:   IVETT LUEBBE Date of Exam: 07/21/2021 Medical Rec #:  [id number]  Height:       64.0 in Accession #:    [id number] Weight:       273.8 lb Date of Birth:  1975/07/03 BSA:          2.236 m Patient Age:    45 years   BP:           152/75 mmHg Patient Gender: F          HR:           87 bpm. Exam Location:  Inpatient Procedure: 2D Echo, Cardiac Doppler and Color Doppler Indications:    Dyspnea  History:        Patient has prior history of Echocardiogram examinations, most                 recent 01/24/2020. Risk Factors:Sleep Apnea and Former Smoker.  Sonographer:    Clayton Lefort RDCS (AE) Referring Phys: [id number] Murlean Iba  Sonographer Comments: Patient is morbidly obese, echo  performed with patient supine and on artificial respirator and no subcostal window. Image acquisition challenging due to patient body habitus. IMPRESSIONS  1. Left ventricular ejection fraction, by estimation, is 60 to 65%. The left ventricle has normal function. The left ventricle has no regional wall motion abnormalities. There is moderate left ventricular hypertrophy. Left ventricular diastolic parameters are consistent with Grade I diastolic dysfunction (impaired relaxation).  2. Right ventricular systolic function is normal. The right ventricular size is normal. Tricuspid regurgitation signal is inadequate for assessing PA pressure.  3. The mitral valve is normal in structure. No evidence of mitral valve regurgitation. No evidence of mitral stenosis.  4. The  aortic valve is tricuspid. There is mild calcification of the aortic valve. There is mild thickening of the aortic valve. Aortic valve regurgitation is mild. No aortic stenosis is present. FINDINGS  Left Ventricle: Left ventricular ejection fraction, by estimation, is 60 to 65%. The left ventricle has normal function. The left ventricle has no regional wall motion abnormalities. The left ventricular internal cavity size was normal in size. There is  moderate left ventricular hypertrophy. Left ventricular diastolic parameters are consistent with Grade I diastolic dysfunction (impaired relaxation). Indeterminate filling pressures. Right Ventricle: The right ventricular size is normal. No increase in right ventricular wall thickness. Right ventricular systolic function is normal. Tricuspid regurgitation signal is inadequate for assessing PA pressure. Left Atrium: Left atrial size was normal in size. Right Atrium: Right atrial size was normal in size. Pericardium: The pericardium was not well visualized. Mitral Valve: The mitral valve is normal in structure. There is mild thickening of the mitral valve leaflet(s). There is mild calcification of the mitral valve  leaflet(s). Mild mitral annular calcification. No evidence of mitral valve regurgitation. No evidence of mitral valve stenosis. Tricuspid Valve: The tricuspid valve is not well visualized. Tricuspid valve regurgitation is not demonstrated. No evidence of tricuspid stenosis. Aortic Valve: The aortic valve is tricuspid. There is mild calcification of the aortic valve. There is mild thickening of the aortic valve. There is mild aortic valve annular calcification. Aortic valve regurgitation is mild. No aortic stenosis is present. Aortic valve mean gradient measures 5.0 mmHg. Aortic valve peak gradient measures 9.1 mmHg. Aortic valve area, by VTI measures 3.00 cm. Pulmonic Valve: The pulmonic valve was not well visualized. Pulmonic valve regurgitation is not visualized. No evidence of pulmonic stenosis. Aorta: The aortic root is normal in size and structure. IAS/Shunts: The interatrial septum was not well visualized.  LEFT VENTRICLE PLAX 2D LVIDd:         4.40 cm LVIDs:         3.10 cm LV PW:         1.20 cm LV IVS:        1.30 cm LVOT diam:     2.10 cm LV SV:         73 LV SV Index:   33 LVOT Area:     3.46 cm  RIGHT VENTRICLE RV Basal diam:  3.20 cm RV S prime:     29.00 cm/s TAPSE (M-mode): 3.5 cm LEFT ATRIUM             Index        RIGHT ATRIUM           Index LA diam:        3.60 cm 1.61 cm/m   RA Area:     15.70 cm LA Vol (A2C):   61.1 ml 27.33 ml/m  RA Volume:   38.40 ml  17.18 ml/m LA Vol (A4C):   53.0 ml 23.71 ml/m LA Biplane Vol: 60.5 ml 27.06 ml/m  AORTIC VALVE AV Area (Vmax):    3.07 cm AV Area (Vmean):   3.01 cm AV Area (VTI):     3.00 cm AV Vmax:           151.00 cm/s AV Vmean:          104.000 cm/s AV VTI:            0.244 m AV Peak Grad:      9.1 mmHg AV Mean Grad:      5.0 mmHg LVOT Vmax:  134.00 cm/s LVOT Vmean:        90.300 cm/s LVOT VTI:          0.211 m LVOT/AV VTI ratio: 0.86  AORTA Ao Root diam: 3.00 cm Ao Asc diam:  3.30 cm  SHUNTS Systemic VTI:  0.21 m Systemic Diam: 2.10 cm  Carlyle Dolly MD Electronically signed by Carlyle Dolly MD Signature Date/Time: 07/21/2021/10:59:45 AM    Final      Assessment & Plan:   Severe persistent asthma - Admitted for acute asthma exacerbation in February requiring intubation. She is feeling better, no longer wheezing. She can not afford nebulizer's. Recommend resuming Trelegy 257mg and using ipratropium-albuterol q6 hours as needed for breakthrough sob/wheezing. Continue Singulair 183mat bedtime. Advised she follow-up with Dr. GaErnst Bowlero discuss need for Nucala.   Acute respiratory failure with hypoxia (HCC) - Resolved; Required intubated during most recent admission. Weaned to room air.   GERD (gastroesophageal reflux disease) - Possible irritable larynx/vocal cord dysfunction could be exacerbating asthma symptoms. Continue Protonix twice daily and GERD diet. Referring to ENT to assess.   OSA on CPAP - Struggling with compliance, she typically tolerates CPAP well and reports benefit from use. Feels she is suffocating. Recommend adjusting pressure 8-18cm h20. Reinforced importance of use, advised she aim to wear everynight 4-6 hours or longer.   Body mass index (BMI) 45.0-49.9, [facility] Patient is planning for bariatric surgery in May, she will need pre-op clearance prior d/t asthma and OSA   Seropositive rheumatoid arthritis ([place] - Continue plaquenil and follow with rheumatology as directed   Allergic rhinitis - Start Zyrtec 1031maily and prn tylenol 650m2mery 6 hours for sinus headache  40 mins spent on case: > 50% face to face with patient  ElizMartyn Ehrich 08/07/2021

## 2021-08-07 ENCOUNTER — Ambulatory Visit
Admission: EM | Admit: 2021-08-07 | Discharge: 2021-08-07 | Disposition: A | Discharge status: Home / Self Care | Payer: Medicaid Other | Attending: Urgent Care | Admitting: Urgent Care

## 2021-08-07 ENCOUNTER — Other Ambulatory Visit: Payer: Self-pay

## 2021-08-07 ENCOUNTER — Encounter: Payer: Self-pay | Admitting: Emergency Medicine

## 2021-08-07 ENCOUNTER — Encounter: Payer: Self-pay | Admitting: Primary Care

## 2021-08-07 DIAGNOSIS — E119 Type 2 diabetes mellitus without complications: Secondary | ICD-10-CM | POA: Diagnosis not present

## 2021-08-07 DIAGNOSIS — J4550 Severe persistent asthma, uncomplicated: Secondary | ICD-10-CM

## 2021-08-07 DIAGNOSIS — J309 Allergic rhinitis, unspecified: Secondary | ICD-10-CM | POA: Diagnosis not present

## 2021-08-07 DIAGNOSIS — J019 Acute sinusitis, unspecified: Secondary | ICD-10-CM

## 2021-08-07 DIAGNOSIS — R451 Restlessness and agitation: Secondary | ICD-10-CM

## 2021-08-07 MED ORDER — AMOXICILLIN 500 MG PO CAPS: 500.0000 mg | ORAL_CAPSULE | Freq: Three times a day (TID) | ORAL | 0 refills | Status: DC

## 2021-08-07 NOTE — Assessment & Plan Note (Addendum)
-   Admitted for acute asthma exacerbation in February requiring intubation. She is feeling better, no longer wheezing. She can not afford nebulizer's. Recommend resuming Trelegy and using ipratropium-albuterol q6 hours as needed for breakthrough sob/wheezing. Continue Singulair 10mg  at bedtime. Advised she follow-up with Dr. Dellis Anes to discuss need for Nucala.  ?

## 2021-08-07 NOTE — Assessment & Plan Note (Signed)
-   Patient is planning for bariatric surgery in May, she will need pre-op clearance prior d/t asthma and OSA  ?

## 2021-08-07 NOTE — ED Triage Notes (Addendum)
Pt reports headache since Friday and states sinus pressure, nausea, emesis, light/sound sensitivity. Pt reports hx of migraines but reports medication is not working. Denies any known head injury.  ? ?Pt reports was discharged last week from hospital after severe asthma exacerbation and being on ventilator. Pt denies having headache at discharge from hospital.  ? ?Pt reports was at followup appt with pulmonologist yesterday and states provider was calling in abx for headache/sinus infection but pharmacy never received px. Also contacted pcp and was told to come to [facility] today as no available appts at their practice. ?

## 2021-08-07 NOTE — Assessment & Plan Note (Signed)
-   Start Zyrtec 10mg  daily and prn tylenol 650mg  every 6 hours for sinus headache ?

## 2021-08-07 NOTE — Assessment & Plan Note (Signed)
-   Continue plaquenil and follow with rheumatology as directed  ?

## 2021-08-07 NOTE — Assessment & Plan Note (Addendum)
-   Possible irritable larynx/vocal cord dysfunction could be exacerbating asthma symptoms. Continue Protonix twice daily and GERD diet. Referring to ENT to assess.  ?

## 2021-08-07 NOTE — Progress Notes (Signed)
Reviewed and agree with assessment/plan. ? ? ?Shakayla Hickox, MD ?[facility] ?08/07/2021, 9:58 AM ?Pager:  [phone] ? ?

## 2021-08-07 NOTE — Assessment & Plan Note (Addendum)
-   Struggling with compliance, she typically tolerates CPAP well and reports benefit from use. Feels she is suffocating. Recommend adjusting pressure 8-18cm h20. Reinforced importance of use, advised she aim to wear everynight 4-6 hours or longer.  ?

## 2021-08-07 NOTE — ED Provider Notes (Signed)
[place]   MRN: [id number] DOB: 1975-12-25  Subjective:   Elizabeth Bautista is a 46 y.o. female presenting for 5-day history of acute onset persistent sinus congestion, sinus pressure, facial pain, ear pain.  Has concerns about a sinus infection.  She just had a visit with a pulmonologist in was also in contact with her PCP.  According to the patient, reports that both had concerns about a sinus infection that warranted antibiotics but unfortunately I found no documentation of this.  Patient is not a smoker.  She is compliant with her medications for her asthma.  Was recently hospitalized for this 07/17/2021-07/25/2021.  No current facility-administered medications for this encounter.  Current Outpatient Medications:    acetaminophen (TYLENOL) 325 MG tablet, Take 2 tablets (650 mg total) by mouth every 6 (six) hours as needed for headache., Disp: 30 tablet, Rfl: 0   albuterol (PROAIR HFA) 108 (90 Base) MCG/ACT inhaler, 2 puffs every 4 hours as needed only  if your can't catch your breath (Patient taking differently: Inhale 1-2 puffs into the lungs every 4 (four) hours as needed for wheezing or shortness of breath (only if you can't catch your breath).), Disp: 18 g, Rfl: 11   azelastine (ASTELIN) 0.1 % nasal spray, Place 1 spray into both nostrils 2 (two) times daily. Use in each nostril as directed, Disp: 30 mL, Rfl: 12   blood glucose meter kit and supplies, Dispense based on patient and insurance preference. Use up to four times daily as directed. (FOR ICD-10 E10.9, E11.9)., Disp: 1 each, Rfl: 0   Blood Pressure Monitoring (SPHYGMOMANOMETER) MISC, 1 each by Does not apply route daily., Disp: 1 each, Rfl: 0   busPIRone (BUSPAR) 7.5 MG tablet, Take 7.5 mg by mouth 2 (two) times daily., Disp: , Rfl:    cetirizine (ZYRTEC) 10 MG tablet, Take 1 tablet (10 mg total) by mouth daily., Disp: 30 tablet, Rfl: 5   Cholecalciferol 1.25 MG (50000 UT) TABS, Take 1 tablet by mouth daily., Disp:  90 tablet, Rfl: 0   cyclobenzaprine (FLEXERIL) 10 MG tablet, Take 10 mg by mouth 3 (three) times daily as needed for muscle spasms. , Disp: , Rfl:    DULoxetine (CYMBALTA) 60 MG capsule, Take 60 mg by mouth daily., Disp: , Rfl:    EPINEPHrine 0.3 mg/0.3 mL IJ SOAJ injection, Inject 0.3 mg into the muscle as needed for anaphylaxis., Disp: 1 each, Rfl: 1   escitalopram (LEXAPRO) 20 MG tablet, Take 1 tablet by mouth every evening., Disp: , Rfl:    famotidine (PEPCID) 20 MG tablet, Take 20 mg by mouth daily., Disp: , Rfl:    ferrous sulfate 325 (65 FE) MG tablet, TAKE 1 TABLET BY MOUTH TWICE A DAY, Disp: 180 tablet, Rfl: 1   fluticasone (FLONASE) 50 MCG/ACT nasal spray, SPRAY 1 SPRAY INTO BOTH NOSTRILS DAILY., Disp: 16 mL, Rfl: 5   Fluticasone-Umeclidin-Vilant (TRELEGY ELLIPTA) 200-62.5-25 MCG/ACT AEPB, Inhale 1 puff into the lungs daily., Disp: 28 each, Rfl: 5   Fluticasone-Umeclidin-Vilant (TRELEGY ELLIPTA) 200-62.5-25 MCG/INH AEPB, Inhale 1 puff into the lungs daily., Disp: 60 each, Rfl: 5   hydrochlorothiazide (HYDRODIURIL) 25 MG tablet, Take 1 tablet (25 mg total) by mouth daily. For high blood pressure., Disp: 90 tablet, Rfl: 1   hydroxychloroquine (PLAQUENIL) 200 MG tablet, TAKE 1 TABLET BY MOUTH TWICE A DAY, Disp: 60 tablet, Rfl: 1   ipratropium-albuterol (DUONEB) 0.5-2.5 (3) MG/3ML SOLN, Take 3 mLs by nebulization every 6 (six) hours as needed., Disp: 360 mL,  Rfl: 2   levETIRAcetam (KEPPRA) 500 MG tablet, Take 1 tablet (500 mg total) by mouth 2 (two) times daily., Disp: 60 tablet, Rfl: 2   metFORMIN (GLUCOPHAGE XR) 500 MG 24 hr tablet, Take 1 tablet (500 mg total) by mouth daily with breakfast., Disp: 90 tablet, Rfl: 1   montelukast (SINGULAIR) 10 MG tablet, Take 1 tablet (10 mg total) by mouth at bedtime., Disp: 30 tablet, Rfl: 1   pantoprazole (PROTONIX) 40 MG tablet, Take 1 tablet (40 mg total) by mouth daily. Take 30-60 min before first meal of the day, Disp: 30 tablet, Rfl: 2    pantoprazole (PROTONIX) 40 MG tablet, Take 1 tablet (40 mg total) by mouth daily., Disp: 30 tablet, Rfl: 0   pregabalin (LYRICA) 225 MG capsule, Take 1 capsule (225 mg total) by mouth 2 (two) times daily., Disp: 60 capsule, Rfl: 2   promethazine (PHENERGAN) 25 MG tablet, Take 1 tablet (25 mg total) by mouth every 6 (six) hours as needed for nausea or vomiting., Disp: 30 tablet, Rfl: 1   QUEtiapine (SEROQUEL) 100 MG tablet, Take 50-100 mg by mouth at bedtime., Disp: , Rfl:    revefenacin (YUPELRI) 175 MCG/3ML nebulizer solution, Take 3 mLs (175 mcg total) by nebulization daily., Disp: 30 mL, Rfl: 0   SUMAtriptan (IMITREX) 50 MG tablet, TAKE 1 TABLET EVERY 2 HOURS AS NEEDED FOR MIGRAINE OR HEADACHE., Disp: 10 tablet, Rfl: 0   traZODone (DESYREL) 100 MG tablet, Take 100 mg by mouth at bedtime., Disp: , Rfl:    triamcinolone (KENALOG) 0.025 % ointment, APPLY TO AFFECTED AREA TWICE A DAY, Disp: 454 g, Rfl: 0   triamcinolone ointment (KENALOG) 0.5 %, Apply 1 application topically as needed., Disp: 30 g, Rfl: 0   Allergies  Allergen Reactions   Ferumoxytol Anaphylaxis    ~5 years ago. Tolerated IV venofer   Phenytoin Anaphylaxis    Other reaction(s): swelling and heart rate decreased   Pecan Nut (Diagnostic) Other (See Comments)   Phenylbutazones Other (See Comments)    Past Medical History:  Diagnosis Date   Anemia    Asthma    COVID-19 virus infection 05/16/2020   04/27/20 dx covid-19, not hospitalized   Diabetes ([place])    on Metformin   Eczema    Edema    Heart problem    "something with the arteries on the left side of the heart"; upcoming appt with cardiology for evaluation   HTN (hypertension)    Rheumatoid arthritis ([place])    on Plaquenil    Seizures ([place])    last seizure 2019   Sleep apnea    Sleep apnea      Past Surgical History:  Procedure Laterality Date   APPENDECTOMY     CESAREAN SECTION     x3    Family History  Problem Relation Age of Onset   High blood  pressure Mother    Rheum arthritis Mother    Diabetes Father    Diabetes Sister    Heart Problems Sister    Diabetes Brother    Diabetes Paternal Grandmother    Diabetes Other        father's side "everybody died from Diabetes"   High blood pressure Other        mother's side, multiple siblings with this    Heart attack Other        family member on mother's side    Diabetes Paternal Aunt    Seizures Cousin  not sibings to the other cousins with seizures   Breast cancer Cousin    Seizures Cousin        not sibings to the other cousins with seizures   Seizures Cousin        not sibings to the other cousins with seizures   Cervical cancer Maternal Aunt    Breast cancer Cousin    Dementia Maternal Aunt    Asthma Maternal Aunt    Heart Problems Maternal Grandmother    Diabetes Brother    Asthma Daughter    Angioedema Daughter    Asthma Son     Social History   Tobacco Use   Smoking status: Former    Years: 10.00    Types: Cigarettes    Quit date: 06/02/2013    Years since quitting: 8.1   Smokeless tobacco: Never   Tobacco comments:    during the 10 years of smoking, smoked 2-3 cigarettes/day  Vaping Use   Vaping Use: Never used  Substance Use Topics   Alcohol use: Not Currently    Comment: occ   Drug use: Not Currently    ROS   Objective:   Vitals: BP 136/86 (BP Location: Right Arm)    Pulse 92    Temp 99 F (37.2 C) (Oral)    Resp 20    Ht 5' 4"  (1.626 m)    Wt 276 lb (125.2 kg)    LMP 07/28/2021 (Exact Date)    SpO2 96%    BMI 47.38 kg/m   Physical Exam Constitutional:      General: She is not in acute distress.    Appearance: Normal appearance. She is well-developed. She is obese. She is not ill-appearing, toxic-appearing or diaphoretic.  HENT:     Head: Normocephalic and atraumatic.     Nose: Congestion present. No rhinorrhea.     Mouth/Throat:     Mouth: Mucous membranes are moist.     Pharynx: No oropharyngeal exudate or posterior  oropharyngeal erythema.  Eyes:     General: No scleral icterus.       Right eye: No discharge.        Left eye: No discharge.     Extraocular Movements: Extraocular movements intact.  Cardiovascular:     Rate and Rhythm: Normal rate.     Heart sounds: No murmur heard.   No friction rub. No gallop.  Pulmonary:     Effort: Pulmonary effort is normal. No respiratory distress.     Breath sounds: No stridor. No wheezing, rhonchi or rales.  Chest:     Chest wall: No tenderness.  Skin:    General: Skin is warm and dry.  Neurological:     Mental Status: She is alert and oriented to person, place, and time.  Psychiatric:     Comments: Agitation.     Assessment and Plan :   PDMP not reviewed this encounter.  1. Acute non-recurrent sinusitis, unspecified location   2. Allergic rhinitis, unspecified seasonality, unspecified trigger   3. Severe persistent asthma without complication   4. Type 2 diabetes mellitus treated without insulin ([place])   5. Agitation    Due to patient's agitation I limited my exam.  Her concern was primarily for having a sinus infection.  She reports that her PCP and pulmonologist wanted to do antibiotics for this.  I suspect there is a primary viral syndrome but to avoid escalation of her visit, I will provide her with amoxicillin 3 times daily for 5  days at a dose of 500 mg.  This is in line with recommendations as an UpToDate.  Recommended follow-up with her PCP as soon as possible.   Jaynee Eagles, PA-C 08/07/21 1144

## 2021-08-07 NOTE — Assessment & Plan Note (Signed)
-   Resolved; Required intubated during most recent admission. Weaned to room air.  ?

## 2021-08-08 ENCOUNTER — Encounter: Payer: Self-pay | Admitting: Adult Health

## 2021-08-08 ENCOUNTER — Other Ambulatory Visit (HOSPITAL_COMMUNITY)
Admission: RE | Admit: 2021-08-08 | Discharge: 2021-08-08 | Disposition: A | Discharge status: Home / Self Care | Payer: Medicaid Other | Source: Ambulatory Visit | Attending: Adult Health | Admitting: Adult Health

## 2021-08-08 ENCOUNTER — Ambulatory Visit (INDEPENDENT_AMBULATORY_CARE_PROVIDER_SITE_OTHER): Payer: Medicaid Other | Admitting: Adult Health

## 2021-08-08 VITALS — BP 145/86 | HR 83 | Ht 64.0 in | Wt 267.0 lb

## 2021-08-08 DIAGNOSIS — N946 Dysmenorrhea, unspecified: Secondary | ICD-10-CM

## 2021-08-08 DIAGNOSIS — Z1211 Encounter for screening for malignant neoplasm of colon: Secondary | ICD-10-CM | POA: Diagnosis not present

## 2021-08-08 DIAGNOSIS — D500 Iron deficiency anemia secondary to blood loss (chronic): Secondary | ICD-10-CM

## 2021-08-08 DIAGNOSIS — N921 Excessive and frequent menstruation with irregular cycle: Secondary | ICD-10-CM | POA: Diagnosis not present

## 2021-08-08 DIAGNOSIS — Z113 Encounter for screening for infections with a predominantly sexual mode of transmission: Secondary | ICD-10-CM

## 2021-08-08 DIAGNOSIS — Z01419 Encounter for gynecological examination (general) (routine) without abnormal findings: Secondary | ICD-10-CM | POA: Diagnosis not present

## 2021-08-08 LAB — HEMOCCULT GUIAC POC 1CARD (OFFICE): Fecal Occult Blood, POC: NEGATIVE

## 2021-08-08 NOTE — Progress Notes (Signed)
Patient ID: Elizabeth Bautista, female   DOB: March 28, 1976, 46 y.o.   MRN: [id number] ?History of Present Illness: ?Elizabeth Bautista is a 46 year old black female,single, [id number] in for a well woman gyn exam and she request pap and STD testing. She was in ICU recently on vent for asthma attack. ?She is anemic and says it is related to her heavy periods, may bleed 2 weeks. ?She was seen at Urgent Care for sinus infection,headache and ear pain yesterday, and is on antibiotics. ?She is using a cane and is short of breath at times.  ?PCP is Dr Allena KatzPatel. ? ? ?Current Medications, Allergies, Past Medical History, Past Surgical History, Family History and Social History were reviewed in [facility] electronic medical record.   ? ? ?Review of Systems: ?Patient denies any  hearing loss, fatigue, blurred vision,  chest pain, abdominal pain, problems with bowel movements, urination, or intercourse. No joint pain or mood swings.  ?See HPI for positives. ? ? ?Physical Exam:BP (!) 145/86 (BP Location: Left Arm, Patient Position: Sitting, Cuff Size: Large)   Pulse 83   Ht 5\' 4"  (1.626 m)   Wt 267 lb (121.1 kg)   LMP 07/28/2021 (Exact Date)   BMI 45.83 kg/m?   ?General:  Well developed, well nourished, no acute distress ?Skin:  Warm and dry ?Neck:  Midline trachea, normal thyroid, good ROM, no lymphadenopathy ?Lungs; Clear to auscultation bilaterally ?Breast:  No dominant palpable mass, retraction, or nipple discharge ?Cardiovascular: Regular rate and rhythm ?Abdomen:  Soft, non tender, no hepatosplenomegaly ?Pelvic:  External genitalia is normal in appearance, no lesions.  The vagina is normal in appearance. Urethra has no lesions or masses. The cervix is bulbous.Pap with GC/CHL and HR HPV genotyping performed.  Uterus is felt to be normal size, shape, and contour.  No adnexal masses or tenderness noted.Bladder is non tender, no masses felt. ?Rectal: Good sphincter tone, no polyps, or hemorrhoids felt.  Hemoccult  negative. ?Extremities/musculoskeletal:  No swelling or varicosities noted, no clubbing or cyanosis ?Psych:  No mood changes, alert and cooperative,seems happy ?AA is 2 ?Fall risk is high ?[facility] 08/02/2021 07/11/2021  ?Decreased Interest 3 3 2   ?Down, Depressed, Hopeless 3 3 2   ?PHQ - 2 Score 6 6 4   ?Altered sleeping 3 3 -  ?Tired, decreased energy 3 3 -  ?Change in appetite 2 1 -  ?Feeling bad or failure about yourself  3 3 -  ?Trouble concentrating 3 1 -  ?Moving slowly or fidgety/restless 2 0 -  ?Suicidal thoughts 0 0 -  ?PHQ-9 Score 22 17 -  ?Difficult doing work/chores - Very difficult -  ?Some recent data might be hidden  ? She is on meds ?GAD 7 : Generalized Anxiety Score 08/08/2021 09/26/2020 06/04/2020  ?Nervous, Anxious, on Edge 3 2 3   ?Control/stop worrying 3 3 3   ?Worry too much - different things 3 3 3   ?Trouble relaxing 3 2 3   ?Restless 3 2 3   ?Easily annoyed or irritable 3 3 3   ?Afraid - awful might happen 3 3 3   ?Total GAD 7 Score 21 18 21   ?Anxiety Difficulty - Very difficult -  ? ? Upstream - 08/08/21 1446   ? ?  ? Pregnancy Intention Screening  ? Does the patient want to become pregnant in the next year? N/A   ? Does the patient's partner want to become pregnant in the next year? N/A   ? Would the patient like to discuss contraceptive options  today? Yes   ?  ? Contraception Wrap Up  ? Current Method Female Sterilization   ? End Method Female Sterilization   ? Contraception Counseling Provided Yes   ? ?  ?  ? ?  ? Examination chaperoned by Clint Bolder RN ? ?  ? ?Impression and Plan: ?1. Encounter for gynecological examination with Papanicolaou smear of cervix ?Pap sent  ?Physical in 1 year ?Pap in 3 if normal  ?Labs with PCP ? ?2. Encounter for screening fecal occult blood testing ? ?3. Menorrhagia with irregular cycle ?Will get appt with Dr Charlotta Newton to discuss possible endometrial ablation, handout given ? ?4. Iron deficiency anemia due to chronic blood loss ?On iron ? ?5.  Dysmenorrhea ?Talk with Dr Charlotta Newton in about a week about options ? ?6. Screening examination for STD (sexually transmitted disease) ?Check HIV and RPR ? ? ? ?  ?  ?

## 2021-08-09 ENCOUNTER — Other Ambulatory Visit: Payer: Self-pay

## 2021-08-09 ENCOUNTER — Ambulatory Visit (INDEPENDENT_AMBULATORY_CARE_PROVIDER_SITE_OTHER): Payer: Medicaid Other | Admitting: Family Medicine

## 2021-08-09 DIAGNOSIS — I5032 Chronic diastolic (congestive) heart failure: Secondary | ICD-10-CM | POA: Diagnosis not present

## 2021-08-09 DIAGNOSIS — J019 Acute sinusitis, unspecified: Secondary | ICD-10-CM

## 2021-08-09 DIAGNOSIS — E1165 Type 2 diabetes mellitus with hyperglycemia: Secondary | ICD-10-CM | POA: Diagnosis not present

## 2021-08-09 NOTE — Patient Instructions (Signed)
Please schedule in office visit with her  PCP, Dr Posey Pronto , when he is soonest available to review  new diagnoses following recent hospitalization, specifically heart failure ad diabetes, please call pt with appointment information this afternoon ? ?Continue antibiotic that you are currently taking for sinus infection, you report that your headache and sinus symptoms are alreadfy improving, which is  good ? ?Thanks for choosing [facility], we consider it a privelige to serve you. ? ? ? ?

## 2021-08-10 LAB — HIV ANTIBODY (ROUTINE TESTING W REFLEX): HIV Screen 4th Generation wRfx: NONREACTIVE

## 2021-08-10 LAB — RPR, QUANT+TP ABS (REFLEX)
Rapid Plasma Reagin, Quant: 1:1 {titer} — ABNORMAL HIGH
T Pallidum Abs: NONREACTIVE

## 2021-08-10 LAB — RPR: RPR Ser Ql: REACTIVE — AB

## 2021-08-11 ENCOUNTER — Encounter: Payer: Self-pay | Admitting: Family Medicine

## 2021-08-11 DIAGNOSIS — J329 Chronic sinusitis, unspecified: Secondary | ICD-10-CM | POA: Insufficient documentation

## 2021-08-11 NOTE — Assessment & Plan Note (Signed)
Continue antibiotic course prescribed, reports symptom improvement I 24 hour period ?

## 2021-08-11 NOTE — Assessment & Plan Note (Signed)
Needs to discuses the diagnosis and management with PCP , reports being unaware that she is diabetic , HBA1C is 7.1 in 11.2022 ?

## 2021-08-11 NOTE — Progress Notes (Signed)
Virtual Visit via Telephone Note ? ?I connected with Elizabeth Bautista on 08/09/2021 at 11:40 AM EST by telephone and verified that I am speaking with the correct person using two identifiers. ? ?Location: ?Patient: home ?Provider: office ?  ?I discussed the limitations, risks, security and privacy concerns of performing an evaluation and management service by telephone and the availability of in person appointments. I also discussed with the patient that there may be a patient responsible charge related to this service. The patient expressed understanding and agreed to proceed. ? ? ?History of Present Illness: ?F/u from UC visit 1 day ago for headache due to sinusitis. States inproved slightly and believes the antibiotics will work.  ?Record review from UC states that the  Provider needed  to de escalate the encounter by prescribing antibiotics as opinion was that the infection was viral ?Main concern is that at [facility] , Provider told her that she was diabetic and has heart failure , states she is unaware of uny of this and want clarification ?States father died of heart failure so she wants to know if she does have heart failure and she is scared ?  ?Observations/Objective: ? ?LMP 07/28/2021 (Exact Date)  ?Good communication with no confusion and intact memory. ?Alert and oriented x 3 ?No signs of respiratory distress during speech ?Pt sounds anxious and concerned about her medical diiagnoses ?Assessment and Plan: ? ?Sinusitis ?Continue antibiotic course prescribed, reports symptom improvement I 24 hour period ? ?Type 2 diabetes mellitus with hyperglycemia, without long-term current use of insulin (HCC) ?Needs to discuses the diagnosis and management with PCP , reports being unaware that she is diabetic , HBA1C is 7.1 in 11.2022 ? ?(HFpEF) heart failure with preserved ejection fraction (HCC) ?Needs face to face  visit with PCP to explain diagnosis and management, states unaware ? ?Follow Up Instructions: ? ?  ?I discussed  the assessment and treatment plan with the patient. The patient was provided an opportunity to ask questions and all were answered. The patient agreed with the plan and demonstrated an understanding of the instructions. ?  ?The patient was advised to call back or seek an in-person evaluation if the symptoms worsen or if the condition fails to improve as anticipated. ? ?I provided 12 minutes of non-face-to-face time during this encounter. ? ? ?Syliva Overman, MD ? ?

## 2021-08-11 NOTE — Assessment & Plan Note (Signed)
Needs face to face  visit with PCP to explain diagnosis and management, states unaware ?

## 2021-08-12 ENCOUNTER — Telehealth: Payer: Self-pay | Admitting: *Deleted

## 2021-08-12 NOTE — Telephone Encounter (Signed)
Ms Vandersteen has called back again about her pain stating she called last week and has not heard from anyone. She is having a lot of pain and the lyrica is not helping.  ?

## 2021-08-12 NOTE — Telephone Encounter (Signed)
Her bariatric surgery is scheduled for 10/14/21. ?

## 2021-08-13 LAB — CYTOLOGY - PAP
Adequacy: ABSENT
Chlamydia: NEGATIVE
Comment: NEGATIVE
Comment: NEGATIVE
Comment: NORMAL
Diagnosis: NEGATIVE
High risk HPV: NEGATIVE
Neisseria Gonorrhea: NEGATIVE

## 2021-08-13 MED ORDER — PREGABALIN 300 MG PO CAPS: 300.0000 mg | ORAL_CAPSULE | Freq: Two times a day (BID) | ORAL | 0 refills | Status: DC

## 2021-08-15 ENCOUNTER — Ambulatory Visit: Payer: Medicaid Other | Admitting: Obstetrics & Gynecology

## 2021-08-16 ENCOUNTER — Other Ambulatory Visit: Payer: Self-pay | Admitting: Allergy & Immunology

## 2021-08-20 ENCOUNTER — Ambulatory Visit (INDEPENDENT_AMBULATORY_CARE_PROVIDER_SITE_OTHER): Payer: Medicaid Other | Admitting: Obstetrics & Gynecology

## 2021-08-20 ENCOUNTER — Other Ambulatory Visit: Payer: Self-pay

## 2021-08-20 ENCOUNTER — Encounter: Payer: Self-pay | Admitting: Obstetrics & Gynecology

## 2021-08-20 VITALS — BP 144/82 | HR 86 | Wt 277.6 lb

## 2021-08-20 DIAGNOSIS — Z9851 Tubal ligation status: Secondary | ICD-10-CM

## 2021-08-20 DIAGNOSIS — N920 Excessive and frequent menstruation with regular cycle: Secondary | ICD-10-CM

## 2021-08-20 DIAGNOSIS — Z98891 History of uterine scar from previous surgery: Secondary | ICD-10-CM

## 2021-08-20 DIAGNOSIS — Z6841 Body Mass Index (BMI) 40.0 and over, adult: Secondary | ICD-10-CM

## 2021-08-20 MED ORDER — NORETHINDRONE ACETATE 5 MG PO TABS: 5.0000 mg | ORAL_TABLET | Freq: Every day | ORAL | 4 refills | Status: DC

## 2021-08-20 NOTE — Progress Notes (Signed)
? ?  GYN VISIT ?Patient name: Elizabeth Bautista MRN [id number]  Date of birth: 1975/08/11 ?Chief Complaint:   ?Discuss ablation ? ?History of Present Illness:   ?Elizabeth Bautista is a 46 y.o. [id number]  female being seen today for the following: ? ?HMB: Long standing issue with menses.  Menses last for about 2 weeks.  No longer using pads due to the heaviness- wearing always secret plus tampons.  Typically has to change frequently due to the heaviness. Plum-sized clots that just fall out in the shower. ?Taking Advil for dysmenorrhea- both lower pelvic and back pain.   ? ?Contraception: tubal ligation ?C-sections x 3 ? ?[place] completed- 10.8cm with cystic areas within endometrium- heterogenous appearance.  Normal ovaries bilaterally. ? ?Patient's last menstrual period was 07/28/2021 (exact date). ? ?Depression screen [facility] 2/9 08/08/2021 08/02/2021 07/11/2021 05/30/2021 05/16/2021  ?Decreased Interest 3 3 2  0 3  ?Down, Depressed, Hopeless 3 3 2 3 3   ?PHQ - 2 Score 6 6 4 3 6   ?Altered sleeping 3 3 - 3 3  ?Tired, decreased energy 3 3 - 3 3  ?Change in appetite 2 1 - 1 1  ?Feeling bad or failure about yourself  3 3 - 3 3  ?Trouble concentrating 3 1 - 3 3  ?Moving slowly or fidgety/restless 2 0 - 3 3  ?Suicidal thoughts 0 0 - 0 0  ?PHQ-9 Score 22 17 - 19 22  ?Difficult doing work/chores - Very difficult - Very difficult Somewhat difficult  ?Some recent data might be hidden  ? ? ? ?Review of Systems:   ?Pertinent items are noted in HPI ?Denies fever/chills, dizziness, headaches, visual disturbances, fatigue, shortness of breath, chest pain, abdominal pain, vomiting, see HPI regarding problems with periods, bowel movements, urination, or intercourse unless otherwise stated above.  ?Pertinent History Reviewed:  ?Reviewed past medical,surgical, social, obstetrical and family history.  ?Reviewed problem list, medications and allergies. ?Physical Assessment:  ? ?Vitals:  ? 08/20/21 1515  ?BP: (!) 144/82  ?Pulse: 86  ?Weight: 277 lb 9.6 oz (125.9 kg)   ?Body mass index is 47.65 kg/m?. ? ?     Physical Examination:  ? General appearance: alert, well appearing, and in no distress ? Psych: mood appropriate, normal affect ? Skin: warm & dry  ? Cardiovascular: normal heart rate noted ? Respiratory: normal respiratory effort, no distress ? Abdomen: obese, soft, non-tender, no rebound, no guarding ? Pelvic: examination not indicated ? Extremities: no edema  ? ?Chaperone: N/A   ? ?Assessment & Plan:  ?1) HMB ?- reviewed all management options including POPs, Lysteda, IUDs ?-options were considered based on her prior surgical and medical history ?-discussed surgical intervention.  Pt never wants a hysterectomy ?-each option was reviewed including risk/benefit ?-after much discussion trail of POP ?-f/u in 54mos ? ?Meds ordered this encounter  ?Medications  ? norethindrone (AYGESTIN) 5 MG tablet  ?  Sig: Take 1 tablet (5 mg total) by mouth daily.  ?  Dispense:  90 tablet  ?  Refill:  4  ? ? ? ?Elizabeth Hidalgo, DO ?Attending Obstetrician & Gynecologist, Faculty Practice ?[facility] [employer], [facility] ? ? ? ?

## 2021-08-20 NOTE — Progress Notes (Signed)
? ?NEUROLOGY CONSULTATION NOTE ? ?Elizabeth Bautista ?MRN: [id number] ?DOB: 1975-09-02 ? ?Referring provider: Ihor Dow, MD ?Primary care provider: Ihor Dow, MD ? ?Reason for consult:  migraine ? ?Assessment/Plan:  ? ?Migraine without aura, without status migrainosus, not intractable ?Memory deficits ?Left sided numbness ? ?Migraine prevention:  Start zonisamide 43m daily and titrate to 1040mdaily.  Would not use beta blocker as she has severe asthma.  Would not use an antidepressant as her current antidepressant regimen is being adjusted by her other physicians.  Would not use topiramate as she already endorses memory deficits and paresthesias. ?Migraine rescue:  Stop sumatriptan and promethazine.  Start rizatriptan MLT 1067mnd Zofran ODT 8mg7mdue to severe nausea and vomiting, will require sublingual form ?Due to her symptoms (left sided numbness and memory deficits), check MRI of brain with and without contrast and B12 level ?Limit use of pain relievers to no more than 2 days out of week to prevent risk of rebound or medication-overuse headache. ?Keep headache diary ?Follow up 5 months. ? ? ? ?Subjective:  ?Elizabeth Bautista 45 y50r old right-handed female with asthma. HTN, DM II, RA, iron-deficiency anemia, sleep apnea and pseudoseizures who presents for migraines.  History supplemented by referring provider's note. ? ?History of migraines since childhood but resolved in young adulthood.  They returned around 2021 but progressively has gotten worse.  Severe diffuse squeezing and stabbing headache including the neck.  Back of neck gets stiff.  Nausea, vomiting, blurred vision/or vision goes dark; She saw an ophthalmologist and had an unremarkable eye exam.  Sometimes bilateral hand tingling, photophobia and phonophobia.  Usually last all day.  They occur usually 4 days a week.  No known triggers.  Nothing helps them.  She also reports short term memory problems in 2021 as well.  She says she will sometimes leave  the stove on or may forget conversations.  Prior thyroid tests okay.  [place] and [place] the back of her head.  Has a lump on back of head. ? ?Current NSAIDS/analgesics:  acetaminophen ?Current triptans:  sumatriptan 50mg76mrrent ergotamine:  none ?Current anti-emetic:  Phenergan 25mg 91mrent muscle relaxants:  Flexeril ?Current Antihypertensive medications:  HCTZ ?Current Antidepressant medications:  Cymbalta 60mg d88m, trazodone, Lexapro 20mg da79m(currently being tapered off of Lexapro and trazodone) ?Current Anticonvulsant medications:  Lyrica 300mg BID56mrrent anti-CGRP:  none ?Current Antihistamines/Decongestants:  Zyrtec ?Other therapy:  none ?Hormone/birth control:  none ?Other medications:  Plaquenil, Seroquel ? ?Past NSAIDS/analgesics:  tramadol, ibuprofen, naproxen ?Past abortive triptans:  none ?Past abortive ergotamine:  none ?Past muscle relaxants:  none ?Past anti-emetic:  Reglan, Zofran 8mg ?Past6mtihypertensive medications:  amlodipine, Lasix.  Beta blockers contraindicated due to severe asthma.   ?Past antidepressant medications:  none ?Past anticonvulsant medications:  Keppra, gabapentin ?Past anti-CGRP:  none ?Past antihistamines/decongestants:  Flonase ?Other past therapies:  none ? ?Caffeine:  no ?Alcohol:  occasional ?Smoker:  no ?Diet:  Drinks a lot of water.  Planning to have bariatric surgery ?Exercise:  she does but limited due to chronic pain (RA and fibromyalgia) ?Depression:  yes; Anxiety:  yes.  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 CT head on 03/10/2020 personally reviewed normal.  EEG on 03/12/2020 normal. ?Other pain:  Chronic pain syndrome - has RA but also underlying fibromyalgia suspected.  Followed by physical medicine and rehab ?Sleep hygiene:  Poor sleep.  May be related to stress.  She has OSA.  CPAP  recently adjusted. ?Family history of headache:  Mother (migraines) ? ?  ? ? ?PAST MEDICAL HISTORY: ?Past  Medical History:  ?Diagnosis Date  ? Anemia   ? Asthma   ? COVID-19 virus infection 05/16/2020  ? 04/27/20 dx covid-19, not hospitalized  ? Diabetes ([place])   ? on Metformin  ? Eczema   ? Edema   ? Heart problem   ? "something with the arteries on the left side of the heart"; upcoming appt with cardiology for evaluation  ? HTN (hypertension)   ? Rheumatoid arthritis ([place])   ? on Plaquenil   ? Seizures ([place])   ? last seizure 2019  ? Sleep apnea   ? Sleep apnea   ? ? ?PAST SURGICAL HISTORY: ?Past Surgical History:  ?Procedure Laterality Date  ? APPENDECTOMY    ? CESAREAN SECTION    ? x3  ? ? ?MEDICATIONS: ?Current Outpatient Medications on File Prior to Visit  ?Medication Sig Dispense Refill  ? acetaminophen (TYLENOL) 325 MG tablet Take 2 tablets (650 mg total) by mouth every 6 (six) hours as needed for headache. 30 tablet 0  ? albuterol (PROAIR HFA) 108 (90 Base) MCG/ACT inhaler 2 puffs every 4 hours as needed only  if your can't catch your breath (Patient taking differently: Inhale 1-2 puffs into the lungs every 4 (four) hours as needed for wheezing or shortness of breath (only if you can't catch your breath).) 18 g 11  ? amoxicillin (AMOXIL) 500 MG capsule Take 1 capsule (500 mg total) by mouth 3 (three) times daily. 15 capsule 0  ? azelastine (ASTELIN) 0.1 % nasal spray Place 1 spray into both nostrils 2 (two) times daily. Use in each nostril as directed 30 mL 12  ? blood glucose meter kit and supplies Dispense based on patient and insurance preference. Use up to four times daily as directed. (FOR ICD-10 E10.9, E11.9). 1 each 0  ? Blood Pressure Monitoring (SPHYGMOMANOMETER) MISC 1 each by Does not apply route daily. 1 each 0  ? busPIRone (BUSPAR) 7.5 MG tablet Take 7.5 mg by mouth 2 (two) times daily.    ? cetirizine (ZYRTEC) 10 MG tablet Take 1 tablet (10 mg total) by mouth daily. 30 tablet 5  ? Cholecalciferol 1.25 MG (50000 UT) TABS Take 1 tablet by mouth daily. 90 tablet 0  ? cyclobenzaprine (FLEXERIL) 10 MG  tablet Take 10 mg by mouth 3 (three) times daily as needed for muscle spasms.     ? DULoxetine (CYMBALTA) 60 MG capsule Take 60 mg by mouth daily.    ? EPINEPHrine 0.3 mg/0.3 mL IJ SOAJ injection Inject 0.3 mg into the muscle as needed for anaphylaxis. 1 each 1  ? escitalopram (LEXAPRO) 20 MG tablet Take 1 tablet by mouth every evening.    ? famotidine (PEPCID) 20 MG tablet Take 20 mg by mouth daily.    ? ferrous sulfate 325 (65 FE) MG tablet TAKE 1 TABLET BY MOUTH TWICE A DAY 180 tablet 1  ? fluticasone (FLONASE) 50 MCG/ACT nasal spray SPRAY 1 SPRAY INTO BOTH NOSTRILS DAILY. 16 mL 5  ? Fluticasone-Umeclidin-Vilant (TRELEGY ELLIPTA) 200-62.5-25 MCG/ACT AEPB Inhale 1 puff into the lungs daily. 28 each 5  ? Fluticasone-Umeclidin-Vilant (TRELEGY ELLIPTA) 200-62.5-25 MCG/INH AEPB Inhale 1 puff into the lungs daily. 60 each 5  ? hydrochlorothiazide (HYDRODIURIL) 25 MG tablet Take 1 tablet (25 mg total) by mouth daily. For high blood pressure. 90 tablet 1  ? hydroxychloroquine (PLAQUENIL) 200 MG tablet TAKE 1 TABLET BY  MOUTH TWICE A DAY 60 tablet 1  ? ipratropium-albuterol (DUONEB) 0.5-2.5 (3) MG/3ML SOLN Take 3 mLs by nebulization every 6 (six) hours as needed. 360 mL 2  ? levETIRAcetam (KEPPRA) 500 MG tablet Take 1 tablet (500 mg total) by mouth 2 (two) times daily. 60 tablet 2  ? metFORMIN (GLUCOPHAGE XR) 500 MG 24 hr tablet Take 1 tablet (500 mg total) by mouth daily with breakfast. 90 tablet 1  ? metoCLOPramide (REGLAN) 10 MG tablet Take 10 mg by mouth every morning. (Patient not taking: Reported on 08/08/2021)    ? montelukast (SINGULAIR) 10 MG tablet TAKE 1 TABLET BY MOUTH EVERYDAY AT BEDTIME 30 tablet 0  ? ondansetron (ZOFRAN-ODT) 8 MG disintegrating tablet Take 8 mg by mouth every 8 (eight) hours as needed. (Patient not taking: Reported on 08/08/2021)    ? pantoprazole (PROTONIX) 40 MG tablet Take 1 tablet (40 mg total) by mouth daily. Take 30-60 min before first meal of the day 30 tablet 2  ? pregabalin (LYRICA) 300  MG capsule Take 1 capsule (300 mg total) by mouth 2 (two) times daily. 60 capsule 0  ? promethazine (PHENERGAN) 25 MG tablet Take 1 tablet (25 mg total) by mouth every 6 (six) hours as needed for nausea o

## 2021-08-21 ENCOUNTER — Ambulatory Visit: Payer: Medicaid Other | Admitting: Neurology

## 2021-08-21 ENCOUNTER — Ambulatory Visit: Payer: Medicaid Other | Admitting: Internal Medicine

## 2021-08-21 ENCOUNTER — Other Ambulatory Visit (INDEPENDENT_AMBULATORY_CARE_PROVIDER_SITE_OTHER): Payer: Medicaid Other

## 2021-08-21 ENCOUNTER — Encounter: Payer: Self-pay | Admitting: Neurology

## 2021-08-21 VITALS — BP 127/81 | HR 88 | Ht 64.0 in | Wt 276.8 lb

## 2021-08-21 DIAGNOSIS — R413 Other amnesia: Secondary | ICD-10-CM | POA: Diagnosis not present

## 2021-08-21 DIAGNOSIS — G43019 Migraine without aura, intractable, without status migrainosus: Secondary | ICD-10-CM

## 2021-08-21 DIAGNOSIS — R200 Anesthesia of skin: Secondary | ICD-10-CM

## 2021-08-21 LAB — VITAMIN B12: Vitamin B-12: 397 pg/mL (ref 211–911)

## 2021-08-21 MED ORDER — ONDANSETRON 8 MG PO TBDP: 8.0000 mg | ORAL_TABLET | Freq: Three times a day (TID) | ORAL | 5 refills | Status: DC | PRN

## 2021-08-21 MED ORDER — RIZATRIPTAN BENZOATE 10 MG PO TBDP: ORAL_TABLET | ORAL | 5 refills | Status: DC

## 2021-08-21 MED ORDER — ZONISAMIDE 25 MG PO CAPS: ORAL_CAPSULE | ORAL | 0 refills | Status: DC

## 2021-08-21 NOTE — Patient Instructions (Addendum)
?  Check MRI of brain with and without contrast ?Check vitamin B12 level ?Start zonisamide 25mg  and titrate up as directed..  Contact me in 2 months with update. ?Take rizatriptan 10mg  at earliest onset of headache.  May repeat dose once in 2 hours if needed.  Maximum 2 tablets in 24 hours. ?Take ondansetron 8mg  for nausea ?Limit use of pain relievers to no more than 2 days out of the week.  These medications include acetaminophen, NSAIDs (ibuprofen/Advil/Motrin, naproxen/Aleve, triptans (Imitrex/sumatriptan), Excedrin, and narcotics.  This will help reduce risk of rebound headaches. ?Be aware of common food triggers: ? - Caffeine:  coffee, black tea, cola, Mt. Dew ? - Chocolate ? - Dairy:  aged cheeses (brie, blue, cheddar, gouda, [place], provolone, Frannie, Swiss, etc), chocolate milk, buttermilk, sour cream, limit eggs and yogurt ? - Nuts, peanut butter ? - Alcohol ? - Cereals/grains:  FRESH breads (fresh bagels, sourdough, doughnuts), yeast productions ? - Processed/canned/aged/cured meats (pre-packaged deli meats, hotdogs) ? - MSG/glutamate:  soy sauce, flavor enhancer, pickled/preserved/marinated foods ? - Sweeteners:  aspartame (Equal, Nutrasweet).  Sugar and Splenda are okay ? - Vegetables:  legumes (lima beans, lentils, snow peas, fava beans, pinto peans, peas, garbanzo beans), sauerkraut, onions, olives, pickles ? - Fruit:  avocados, bananas, citrus fruit (orange, lemon, grapefruit), mango ? - Other:  Frozen meals, macaroni and cheese ?Routine exercise ?Stay adequately hydrated (aim for 64 oz water daily) ?Keep headache diary ?Maintain proper stress management ?Maintain proper sleep hygiene ?Do not skip meals ?Consider supplements:  magnesium citrate 400mg  daily, riboflavin 400mg  daily, coenzyme Q10 100mg  three times daily. ? ?

## 2021-08-27 ENCOUNTER — Ambulatory Visit (INDEPENDENT_AMBULATORY_CARE_PROVIDER_SITE_OTHER): Payer: Medicaid Other | Admitting: Pulmonary Disease

## 2021-08-27 ENCOUNTER — Encounter: Payer: Self-pay | Admitting: Pulmonary Disease

## 2021-08-27 ENCOUNTER — Encounter: Payer: Self-pay | Admitting: Internal Medicine

## 2021-08-27 ENCOUNTER — Ambulatory Visit (HOSPITAL_COMMUNITY)
Admission: RE | Admit: 2021-08-27 | Discharge: 2021-08-27 | Disposition: A | Discharge status: Home / Self Care | Payer: Medicaid Other | Source: Ambulatory Visit | Attending: Internal Medicine | Admitting: Internal Medicine

## 2021-08-27 ENCOUNTER — Other Ambulatory Visit: Payer: Self-pay

## 2021-08-27 ENCOUNTER — Ambulatory Visit (INDEPENDENT_AMBULATORY_CARE_PROVIDER_SITE_OTHER): Payer: Medicaid Other | Admitting: Internal Medicine

## 2021-08-27 VITALS — BP 124/78 | HR 74 | Temp 98.2°F | Ht 64.0 in | Wt 279.8 lb

## 2021-08-27 VITALS — BP 140/87 | HR 62 | Resp 16 | Ht 64.0 in | Wt 279.2 lb

## 2021-08-27 DIAGNOSIS — Z9989 Dependence on other enabling machines and devices: Secondary | ICD-10-CM | POA: Diagnosis not present

## 2021-08-27 DIAGNOSIS — S0990XA Unspecified injury of head, initial encounter: Secondary | ICD-10-CM

## 2021-08-27 DIAGNOSIS — Y939 Activity, unspecified: Secondary | ICD-10-CM | POA: Insufficient documentation

## 2021-08-27 DIAGNOSIS — M059 Rheumatoid arthritis with rheumatoid factor, unspecified: Secondary | ICD-10-CM

## 2021-08-27 DIAGNOSIS — X58XXXA Exposure to other specified factors, initial encounter: Secondary | ICD-10-CM | POA: Insufficient documentation

## 2021-08-27 DIAGNOSIS — M48062 Spinal stenosis, lumbar region with neurogenic claudication: Secondary | ICD-10-CM

## 2021-08-27 DIAGNOSIS — J4550 Severe persistent asthma, uncomplicated: Secondary | ICD-10-CM

## 2021-08-27 DIAGNOSIS — E1165 Type 2 diabetes mellitus with hyperglycemia: Secondary | ICD-10-CM

## 2021-08-27 DIAGNOSIS — J301 Allergic rhinitis due to pollen: Secondary | ICD-10-CM | POA: Diagnosis not present

## 2021-08-27 DIAGNOSIS — G4733 Obstructive sleep apnea (adult) (pediatric): Secondary | ICD-10-CM | POA: Diagnosis not present

## 2021-08-27 DIAGNOSIS — W19XXXA Unspecified fall, initial encounter: Secondary | ICD-10-CM

## 2021-08-27 IMAGING — CT CT HEAD W/O CM
3 of 4 series · 15 of 47 positions shown, 18 images · non-contrast
Comparison: [DATE].

CLINICAL DATA: Headache.



[Series 2: head w o · axial · 0.41mm/px · z∈[+33,+173]mm · 9 of 34 slices shown, 12 images]
[im 3/34  brain]
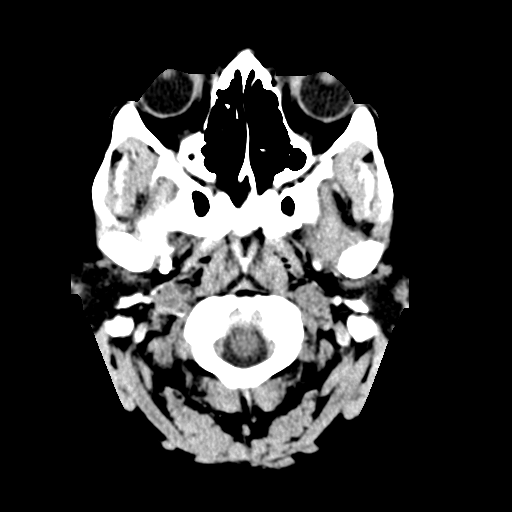
[im 3/34  bone]
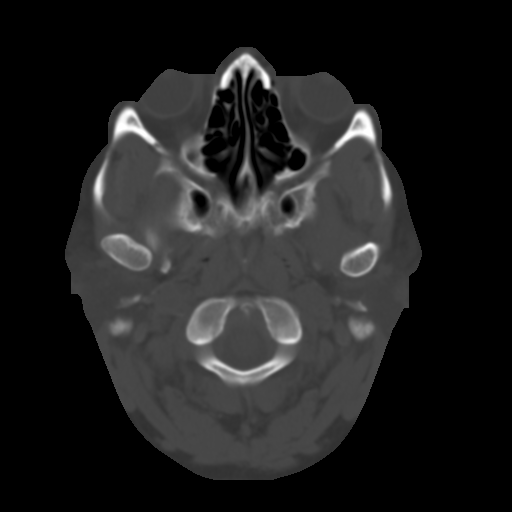
[im 8/34  brain]
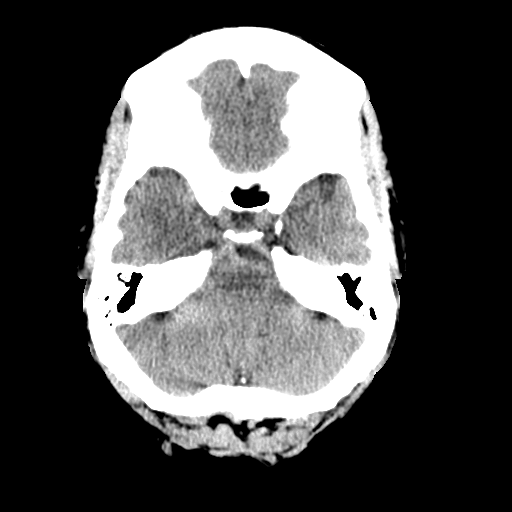
[im 10/34  brain]
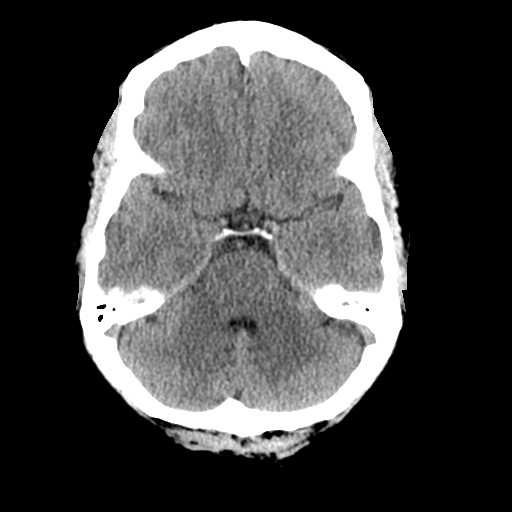
[im 15/34  brain]
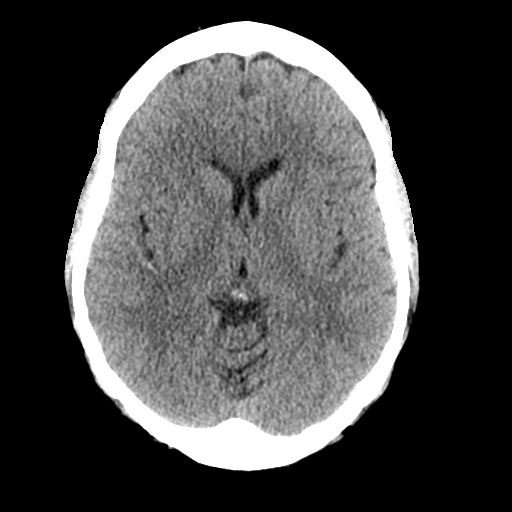
[im 17/34  brain]
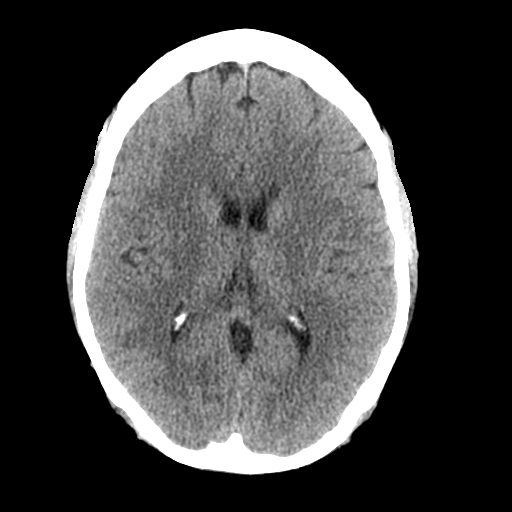
[im 17/34  bone]
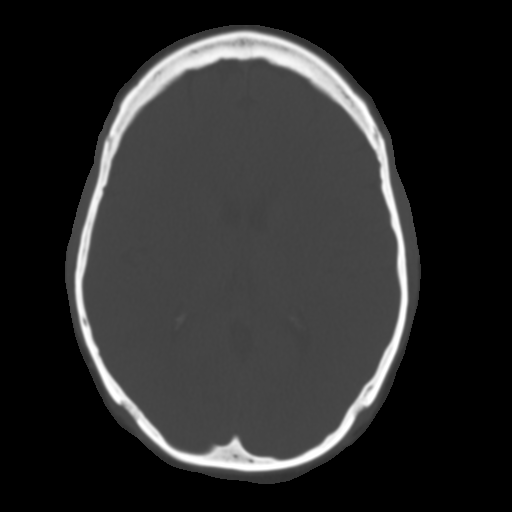
[im 19/34  brain]
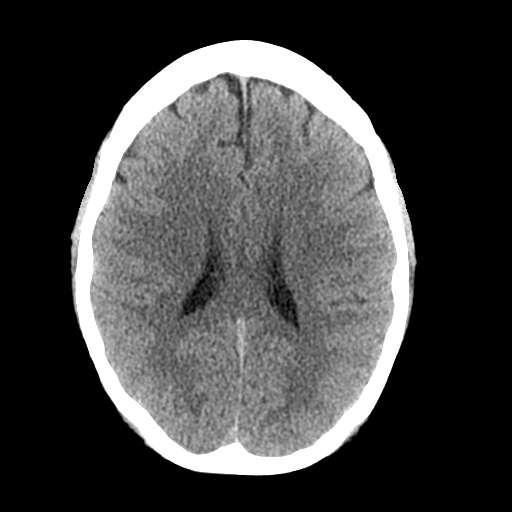
[im 24/34  brain]
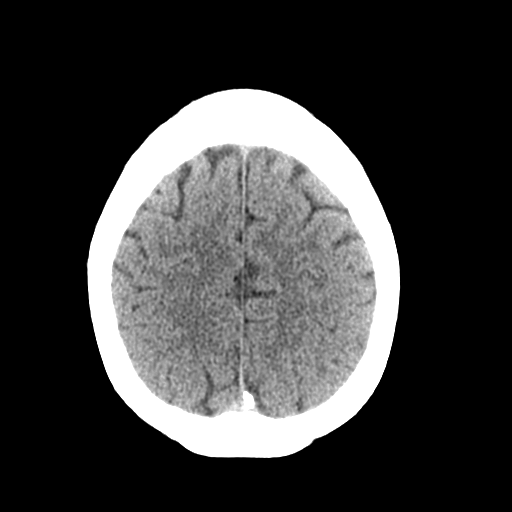
[im 26/34  brain]
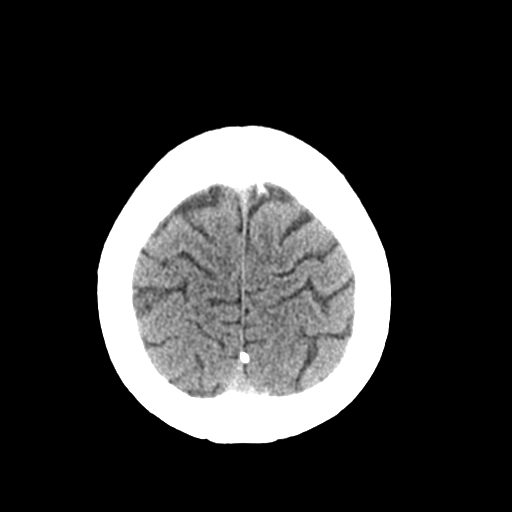
[im 31/34  brain]
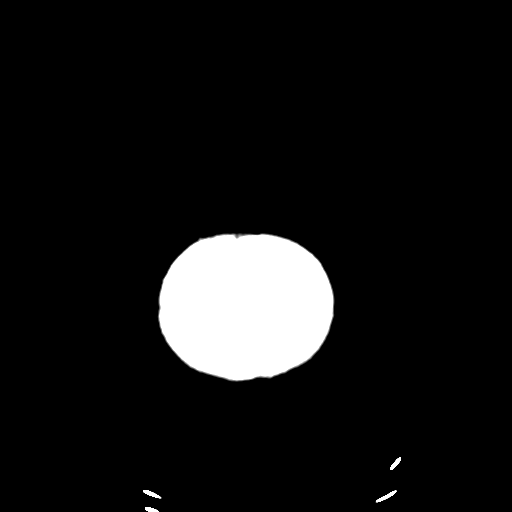
[im 31/34  bone]
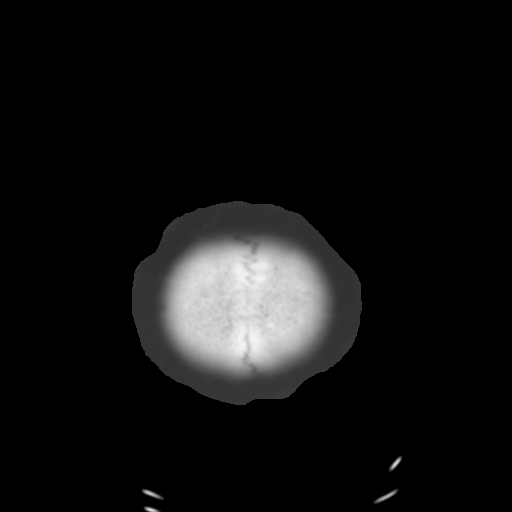

[Series 4: coronal soft · coronal · 0.33mm/px · 3 of 70 slices shown]
[im 24/70  brain]
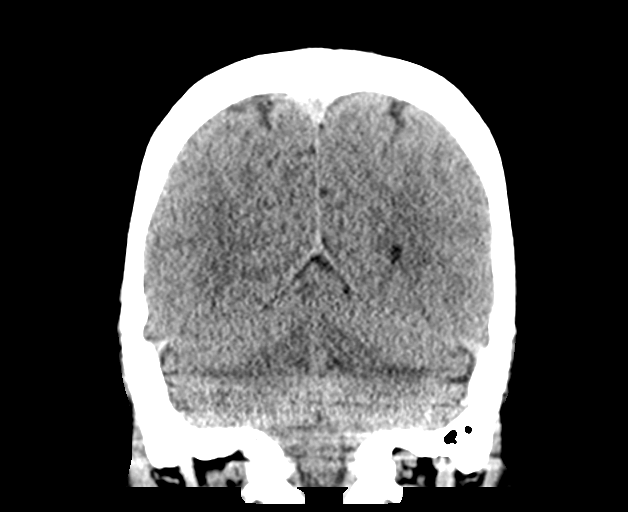
[im 31/70  brain]
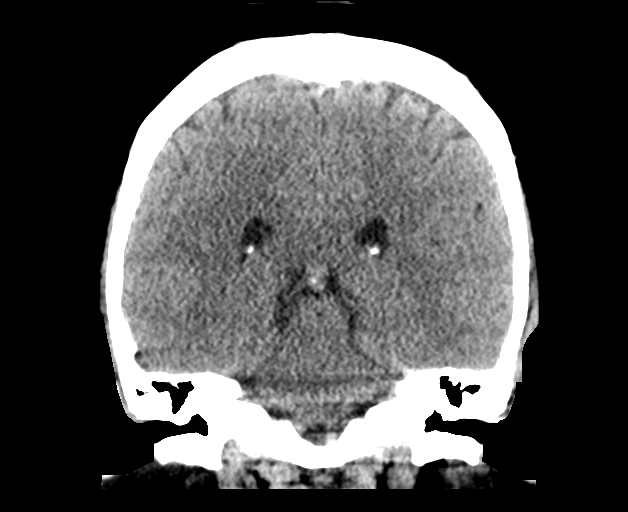
[im 39/70  brain]
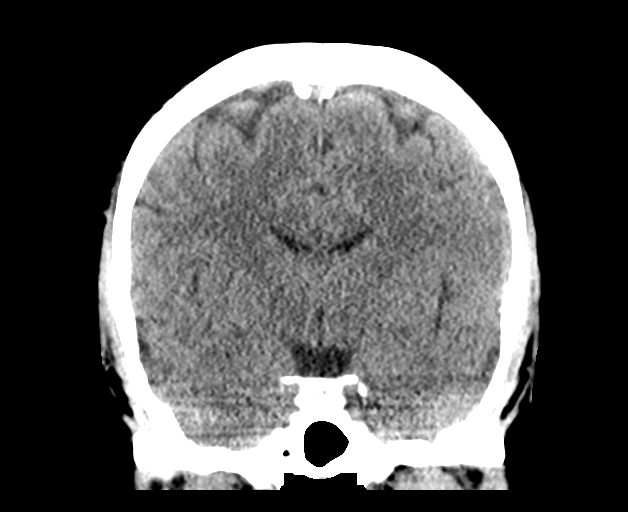

[Series 5: sagittal soft · sagittal · 0.35mm/px · 3 of 59 slices shown]
[im 20/59  brain]
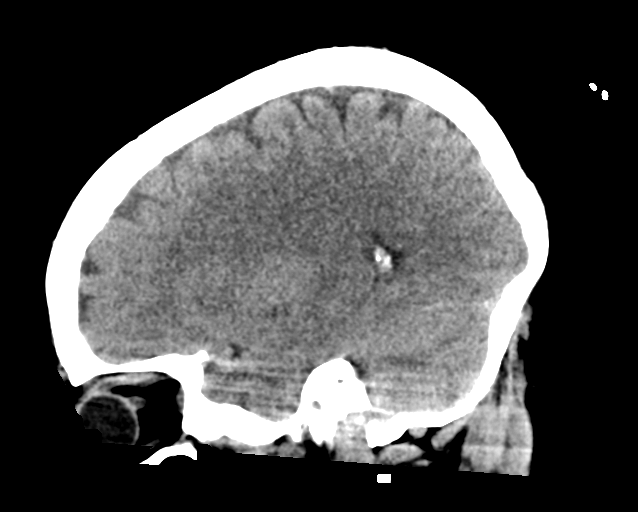
[im 30/59  brain]
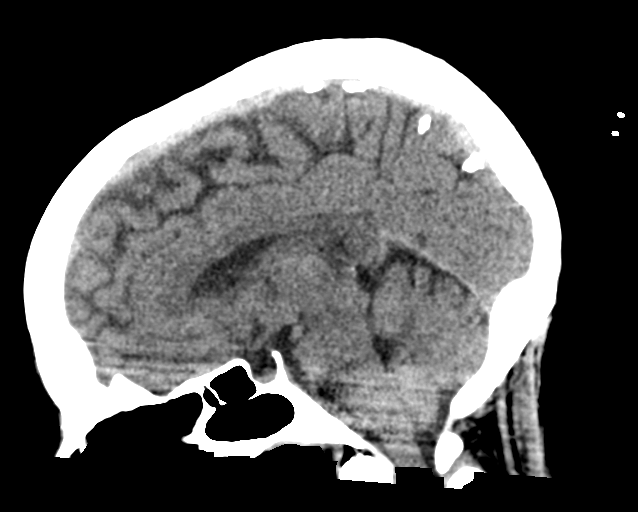
[im 39/59  brain]
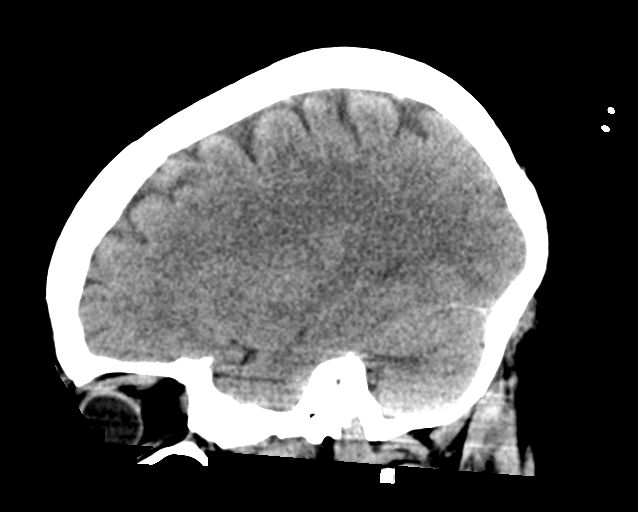

[15 of 47 positions shown; findings below may reference images not displayed]

FINDINGS: Brain: No evidence of acute infarction, hemorrhage, hydrocephalus,
extra-axial collection or mass lesion/mass effect.

Vascular: No hyperdense vessel or unexpected calcification.

Skull: Normal. Negative for fracture or focal lesion.

Sinuses/Orbits: No acute finding.

Other: None.
IMPRESSION: No acute intracranial abnormality seen.

## 2021-08-27 NOTE — Patient Instructions (Signed)
Please continue taking medications as prescribed. ? ?Please get CT head done at [facility]. ? ?You are being referred to Spine surgery. ?

## 2021-08-27 NOTE — Progress Notes (Signed)
? ?[facility] ? ?Chief Complaint  ?Patient presents with  ? Follow-up  ?  Surgery Clearance for Bariatric Surgery in May   ? ? ? ?Constitutional:  ?BP 124/78 (BP Location: Left Arm, Patient Position: Sitting)   Pulse 74   Temp 98.2 ?F (36.8 ?C) (Temporal)   Ht _0  (1.626 m)   Wt 279 lb 12.8 oz (126.9 kg)   LMP 07/28/2021 (Exact Date)   SpO2 99% Comment: ra  BMI 48.03 kg/m?  ? ?Past Medical History:  ?Asthma, Anemia, Seizures, DM type 2, HTN, GERD, RA, COVID 19 infection November 2021 ? ?Past Surgical History:  ?Her  has a past surgical history that includes Appendectomy and Cesarean section. ? ?Brief Summary:  ?Elizabeth Bautista is a 46 y.o. female former smoker with asthma, upper airway cough, laryngopharyngeal reflux, and obstructive sleep apnea.  ?  ? ? ? ?Subjective:  ? ?She was in hospital in February for asthma exacerbation.  Doing better now.  Has appointment with allergist in 2 weeks.  Has appointment with rheumatology next week.  Has more shoulder pain. ? ?CPAP working well. ? ?No having sinus congestion, cough, wheeze, or chest pain.   ? ?Has bariatric surgery scheduled for May. ? ?Physical Exam:  ? ?Appearance - well kempt  ? ?ENMT - no sinus tenderness, no oral exudate, no LAN, Mallampati 3 airway, no stridor ? ?Respiratory - equal breath sounds bilaterally, no wheezing or rales ? ?CV - s1s2 regular rate and rhythm, no murmurs ? ?Ext - no clubbing, no edema ? ?Skin - no rashes ? ?Psych - normal mood and affect ? ? ? ?  ?Pulmonary testing:  ?Spirometry 08/18/17 >> FEV1 2.03 (82%), FEV1% 73 ?CBC 05/10/20 >> eosinophils 0.4 K/uL ?Allergy test 05/30/20 >> rye/bermuda/johnson grass, mold, hickory, dog, cat, cockroach ?A1AT 07/09/20 >> 147 ?RAST 07/09/20 >> dust mites, cats, dogs, cockroach, pecan hickory; IgE 884 ? ?Chest Imaging:  ?CT sinus 10/20/19 >> normal ?CT angio chest 05/10/20 >> airway thickening, faint patchy GGO in lunges ? ?Sleep Tests:  ?PSG 05/23/20 >>  AHI 6.7, SpO2 low 83%.  REM AHI 32.4. ?Auto CPAP 08/07/21 to 08/25/21 >> used on 19 of 20 nights with average 4 hrs 13 min.  Average AHI 0.4 with median CPAP 10 and 95 th percentile CPAP 12 cm H2O ? ?Cardiac Tests:  ?Echo 07/21/21 >> EF 60 to 65%, mod LVH, grade 1 DD ? ?Social History:  ?She  reports that she quit smoking about 8 years ago. Her smoking use included cigarettes. She has never used smokeless tobacco. She reports that she does not currently use alcohol. She reports that she does not currently use drugs. ? ?Family History:  ?Her family history includes Angioedema in her daughter; Asthma in her daughter, maternal aunt, and son; Breast cancer in her cousin and cousin; Cervical cancer in her maternal aunt; Dementia in her maternal aunt; Diabetes in her brother, brother, father, paternal aunt, paternal grandmother, sister, and another family member; Heart Problems in her maternal grandmother and sister; Heart attack in an other family member; High blood pressure in her mother and another family member; Rheum arthritis in her mother; Seizures in her cousin, cousin, and cousin. ?  ? ? ?Assessment/Plan:  ? ?Severe, persistent allergic asthma. ?- breztri wasn't effective ?- yupelri is too expensive ?- continue trelegy 200 one puff daily ?- continue singulair ?- she will follow up with Dr. Gillermina Hu office to discuss whether nucala ?- prn albuterol ? ?Obstructive  sleep apnea. ?- she is compliant with CPAP and reports benefit from therapy ?- she uses [facility] for her DME ?- continue auto CPAP 5 to 15 cm H2O ? ?Upper airway cough syndrome. ?- has perennial allergic rhinitis ?- followed by Dr. Ernst Bowler ?- continue singulair, flonase, astelin, zyrtec ? ?Rheumatoid arthritis. ?- she has been on plaquenil ?- followed by Dr. [name] with [facility] ? ?Obesity. ?- she is scheduled for bariatric surgery with Dr. Heron Nay ?- there are no pulmonary contraindication for her to have surgery ?-  she should continue her asthma and allergy regimen in the perioperative period and resume CPAP therapy after surgery ? ?Time Spent Involved in Patient Care on Day of Examination:  ?38 minutes ? ?Follow up:  ? ?There are no Patient Instructions on file for this visit. ? ?Medication List:  ? ?Allergies as of 08/27/2021   ? ?   Reactions  ? Ferumoxytol Anaphylaxis  ? ~5 years ago. Tolerated IV venofer  ? Phenytoin Anaphylaxis  ? Other reaction(s): swelling and heart rate decreased  ? Pecan Nut (diagnostic) Other (See Comments)  ? Phenylbutazones Other (See Comments)  ? ?  ? ?  ?Medication List  ?  ? ?  ? Accurate as of August 27, 2021 10:04 AM. If you have any questions, ask your nurse or doctor.  ?  ?  ? ?  ? ?acetaminophen 325 MG tablet ?Commonly known as: Tylenol ?Take 2 tablets (650 mg total) by mouth every 6 (six) hours as needed for headache. ?  ?albuterol 108 (90 Base) MCG/ACT inhaler ?Commonly known as: ProAir HFA ?2 puffs every 4 hours as needed only  if your can't catch your breath ?What changed:  ?how much to take ?how to take this ?when to take this ?reasons to take this ?additional instructions ?  ?azelastine 0.1 % nasal spray ?Commonly known as: ASTELIN ?Place 1 spray into both nostrils 2 (two) times daily. Use in each nostril as directed ?  ?blood glucose meter kit and supplies ?Dispense based on patient and insurance preference. Use up to four times daily as directed. (FOR ICD-10 E10.9, E11.9). ?  ?busPIRone 7.5 MG tablet ?Commonly known as: BUSPAR ?Take 7.5 mg by mouth 2 (two) times daily. ?  ?cetirizine 10 MG tablet ?Commonly known as: ZYRTEC ?Take 1 tablet (10 mg total) by mouth daily. ?  ?Cholecalciferol 1.25 MG (50000 UT) Tabs ?Take 1 tablet by mouth daily. ?  ?cyclobenzaprine 10 MG tablet ?Commonly known as: FLEXERIL ?Take 10 mg by mouth 3 (three) times daily as needed for muscle spasms. ?  ?DULoxetine 60 MG capsule ?Commonly known as: CYMBALTA ?Take 60 mg by mouth daily. ?  ?EPINEPHrine 0.3 mg/0.3  mL Soaj injection ?Commonly known as: EPI-PEN ?Inject 0.3 mg into the muscle as needed for anaphylaxis. ?  ?escitalopram 20 MG tablet ?Commonly known as: LEXAPRO ?Take 1 tablet by mouth every evening. ?  ?famotidine 20 MG tablet ?Commonly known as: PEPCID ?Take 20 mg by mouth daily. ?  ?ferrous sulfate 325 (65 FE) MG tablet ?TAKE 1 TABLET BY MOUTH TWICE A DAY ?  ?fluticasone 50 MCG/ACT nasal spray ?Commonly known as: FLONASE ?SPRAY 1 SPRAY INTO BOTH NOSTRILS DAILY. ?  ?hydrochlorothiazide 25 MG tablet ?Commonly known as: HYDRODIURIL ?Take 1 tablet (25 mg total) by mouth daily. For high blood pressure. ?  ?hydroxychloroquine 200 MG tablet ?Commonly known as: PLAQUENIL ?TAKE 1 TABLET BY MOUTH TWICE A DAY ?  ?ipratropium-albuterol 0.5-2.5 (3) MG/3ML Soln ?Commonly known  as: DUONEB ?Take 3 mLs by nebulization every 6 (six) hours as needed. ?  ?levETIRAcetam 500 MG tablet ?Commonly known as: Keppra ?Take 1 tablet (500 mg total) by mouth 2 (two) times daily. ?  ?metFORMIN 500 MG 24 hr tablet ?Commonly known as: Glucophage XR ?Take 1 tablet (500 mg total) by mouth daily with breakfast. ?  ?metoCLOPramide 10 MG tablet ?Commonly known as: REGLAN ?Take 10 mg by mouth every morning. ?  ?montelukast 10 MG tablet ?Commonly known as: SINGULAIR ?TAKE 1 TABLET BY MOUTH EVERYDAY AT BEDTIME ?  ?norethindrone 5 MG tablet ?Commonly known as: AYGESTIN ?Take 1 tablet (5 mg total) by mouth daily. ?  ?ondansetron 8 MG disintegrating tablet ?Commonly known as: ZOFRAN-ODT ?Take 1 tablet (8 mg total) by mouth every 8 (eight) hours as needed for nausea or vomiting. ?  ?pantoprazole 40 MG tablet ?Commonly known as: Protonix ?Take 1 tablet (40 mg total) by mouth daily. Take 30-60 min before first meal of the day ?  ?pregabalin 300 MG capsule ?Commonly known as: LYRICA ?Take 1 capsule (300 mg total) by mouth 2 (two) times daily. ?  ?QUEtiapine 100 MG tablet ?Commonly known as: SEROQUEL ?Take 50-100 mg by mouth at bedtime. ?  ?revefenacin 175  MCG/3ML nebulizer solution ?Commonly known as: YUPELRI ?Take 3 mLs (175 mcg total) by nebulization daily. ?  ?rizatriptan 10 MG disintegrating tablet ?Commonly known as: Maxalt-MLT ?Take 1 tablet earliest onset of

## 2021-08-27 NOTE — Assessment & Plan Note (Signed)
Lab Results  ?Component Value Date  ? HGBA1C 5.8 (H) 07/17/2021  ? ?Well controlled with metformin 500 mg QD ?Advised to follow diabetic diet ?Will discuss about statin after her weight loss surgery ?Diabetic eye exam: Advised to follow up with Ophthalmology for diabetic eye exam ?

## 2021-08-27 NOTE — Assessment & Plan Note (Signed)
Previous MRI of lumbar spine reviewed ?Her leg weakness is likely due to lumbar radiculopathy ?Referred to spine surgery ?She is planned start PT this week as well ?On Lyrica for neuropathy, followed by Dr. Ernestina Patches ? ?

## 2021-08-27 NOTE — Assessment & Plan Note (Signed)
Continue albuterol nebulizer every 6 hours as needed for wheezing shortness of breath, Trelegy Ellipta 1 puff into the lungs daily, Yupelri 1375mcg/3ml solution daily ?Patient encouraged to maintain close follow-up with pulmonology, she verbalized understanding. ? ?

## 2021-08-27 NOTE — Assessment & Plan Note (Signed)
On 03/25, had head injury ?Has had worsening of dizziness since the fall with scalp mass ?Mechanical fall nightly due to leg weakness ?Check CT head without contrast to rule out scalp hematoma ? ?

## 2021-08-27 NOTE — Patient Instructions (Signed)
Follow-up in 7 months

## 2021-08-27 NOTE — Assessment & Plan Note (Signed)
On Plaquenil °Flexeril and Ibuprofen PRN °Followed by Rheumatology - Dr Rice °Needs Ophthalmology evaluation due to use of Plaquenil °

## 2021-08-27 NOTE — Progress Notes (Signed)
? ?Established Patient Office Visit ? ?Subjective:  ?Patient ID: Elizabeth Bautista, female    DOB: 06-02-1976  Age: 46 y.o. MRN: [id number] ? ?CC:  ?Chief Complaint  ?Patient presents with  ? Diabetes  ?  Follow up visit   ? Fall  ?  Fell 2 weeks ago and then again Saturday and hit back of her head and has a knot that is sore to the touch. Legs keep giving out on her   ? ? ?HPI ?Elizabeth Bautista is a 46 y.o. female with past medical history of HTN, OSA on CPAP, asthma, GERD, RA, depression with anxiety, chronic LE swelling, seizure disorder, chronic low back pain and morbid obesity who presents for f/u of her chronic medical conditions. ? ?She was admitted for severe asthma exacerbation from 02/15-02/18, and needed intubation/mechanical ventilation at that time.  She has seen Dr. Halford Chessman this morning.  She currently denies any dyspnea or wheezing.  She reports feeling weak since being discharged.  She also reports a fall in her bathroom on 03/25, and sustained head injury in her occipital area.  Her son had to come to help her get up.  She states that her legs gave out, due to which she had a fall.  She has been having worsening of her dizziness since that fall.  Of note, she had seen her neurologist in the last week for migraine, and is planned to get MRI of brain for memory deficit concern. ? ?Type II DM: She has been taking metformin for it.  Her last HbA1C was 5.8 in 07/2021. ? ?She is also concerned about her echo result, which showed HFpEF.  I had lengthy discussion with her about diastolic dysfunction.  Her dyspnea or LE swelling are not likely solely due to HFpEF, she expressed understanding. ? ?Past Medical History:  ?Diagnosis Date  ? Acute respiratory failure with hypoxia ([place])   ? Anemia   ? Asthma   ? COVID-19 virus infection 05/16/2020  ? 04/27/20 dx covid-19, not hospitalized  ? Diabetes ([place])   ? on Metformin  ? Eczema   ? Edema   ? Heart problem   ? "something with the arteries on the left side of the heart";  upcoming appt with cardiology for evaluation  ? HTN (hypertension)   ? Rheumatoid arthritis ([place])   ? on Plaquenil   ? Seizures ([place])   ? last seizure 2019  ? Sleep apnea   ? Sleep apnea   ? ? ?Past Surgical History:  ?Procedure Laterality Date  ? APPENDECTOMY    ? CESAREAN SECTION    ? x3  ? ? ?Family History  ?Problem Relation Age of Onset  ? High blood pressure Mother   ? Rheum arthritis Mother   ? Diabetes Father   ? Diabetes Sister   ? Heart Problems Sister   ? Diabetes Brother   ? Diabetes Paternal Grandmother   ? Diabetes Other   ?     father's side "everybody died from Diabetes"  ? High blood pressure Other   ?     mother's side, multiple siblings with this   ? Heart attack Other   ?     family member on mother's side   ? Diabetes Paternal Aunt   ? Seizures Cousin   ?     not sibings to the other cousins with seizures  ? Breast cancer Cousin   ? Seizures Cousin   ?     not sibings to the other  cousins with seizures  ? Seizures Cousin   ?     not sibings to the other cousins with seizures  ? Cervical cancer Maternal Aunt   ? Breast cancer Cousin   ? Dementia Maternal Aunt   ? Asthma Maternal Aunt   ? Heart Problems Maternal Grandmother   ? Diabetes Brother   ? Asthma Daughter   ? Angioedema Daughter   ? Asthma Son   ? ? ?Social History  ? ?Socioeconomic History  ? Marital status: Single  ?  Spouse name: Not on file  ? Number of children: 5  ? Years of education: Not on file  ? Highest education level: 9th grade  ?Occupational History  ? Not on file  ?Tobacco Use  ? Smoking status: Former  ?  Years: 10.00  ?  Types: Cigarettes  ?  Quit date: 06/02/2013  ?  Years since quitting: 8.2  ? Smokeless tobacco: Never  ? Tobacco comments:  ?  during the 10 years of smoking, smoked 2-3 cigarettes/day  ?Vaping Use  ? Vaping Use: Never used  ?Substance and Sexual Activity  ? Alcohol use: Not Currently  ?  Comment: occ  ? Drug use: Not Currently  ? Sexual activity: Not Currently  ?  Birth control/protection: Surgical  ?   Comment: tubal  ?Other Topics Concern  ? Not on file  ?Social History Narrative  ? Divorced.Lives with 3 kids.Originally from [place].Came from [place] 7 months ago.  ?   ? 02/27/2020  ? Right handed  ? Caffeine: none   ? ?Social Determinants of Health  ? ?Financial Resource Strain: High Risk  ? Difficulty of Paying Living Expenses: Very hard  ?Food Insecurity: Food Insecurity Present  ? Worried About Charity fundraiser in the Last Year: Sometimes true  ? Ran Out of Food in the Last Year: Often true  ?Transportation Needs: No Transportation Needs  ? Lack of Transportation (Medical): No  ? Lack of Transportation (Non-Medical): No  ?Physical Activity: Insufficiently Active  ? Days of Exercise per Week: 1 day  ? Minutes of Exercise per Session: 10 min  ?Stress: Stress Concern Present  ? Feeling of Stress : Very much  ?Social Connections: Socially Isolated  ? Frequency of Communication with Friends and Family: Once a week  ? Frequency of Social Gatherings with Friends and Family: Never  ? Attends Religious Services: Never  ? Active Member of Clubs or Organizations: No  ? Attends Archivist Meetings: Never  ? Marital Status: Divorced  ?Intimate Partner Violence: Not At Risk  ? Fear of Current or Ex-Partner: No  ? Emotionally Abused: No  ? Physically Abused: No  ? Sexually Abused: No  ? ? ?Outpatient Medications Prior to Visit  ?Medication Sig Dispense Refill  ? acetaminophen (TYLENOL) 325 MG tablet Take 2 tablets (650 mg total) by mouth every 6 (six) hours as needed for headache. 30 tablet 0  ? albuterol (PROAIR HFA) 108 (90 Base) MCG/ACT inhaler 2 puffs every 4 hours as needed only  if your can't catch your breath (Patient taking differently: Inhale 1-2 puffs into the lungs every 4 (four) hours as needed for wheezing or shortness of breath (only if you can't catch your breath).) 18 g 11  ? azelastine (ASTELIN) 0.1 % nasal spray Place 1 spray into both nostrils 2 (two) times daily. Use in each nostril as  directed 30 mL 12  ? blood glucose meter kit and supplies Dispense based on patient and  insurance preference. Use up to four times daily as directed. (FOR ICD-10 E10.9, E11.9). 1 each 0  ? Blood Pressure Monitoring (SPHYGMOMANOMETER) MISC 1 each by Does not apply route daily. 1 each 0  ? busPIRone (BUSPAR) 7.5 MG tablet Take 7.5 mg by mouth 2 (two) times daily.    ? cetirizine (ZYRTEC) 10 MG tablet Take 1 tablet (10 mg total) by mouth daily. 30 tablet 5  ? Cholecalciferol 1.25 MG (50000 UT) TABS Take 1 tablet by mouth daily. 90 tablet 0  ? cyclobenzaprine (FLEXERIL) 10 MG tablet Take 10 mg by mouth 3 (three) times daily as needed for muscle spasms.     ? DULoxetine (CYMBALTA) 60 MG capsule Take 60 mg by mouth daily.    ? EPINEPHrine 0.3 mg/0.3 mL IJ SOAJ injection Inject 0.3 mg into the muscle as needed for anaphylaxis. 1 each 1  ? escitalopram (LEXAPRO) 20 MG tablet Take 1 tablet by mouth every evening.    ? famotidine (PEPCID) 20 MG tablet Take 20 mg by mouth daily.    ? ferrous sulfate 325 (65 FE) MG tablet TAKE 1 TABLET BY MOUTH TWICE A DAY 180 tablet 1  ? fluticasone (FLONASE) 50 MCG/ACT nasal spray SPRAY 1 SPRAY INTO BOTH NOSTRILS DAILY. 16 mL 5  ? Fluticasone-Umeclidin-Vilant (TRELEGY ELLIPTA) 200-62.5-25 MCG/ACT AEPB Inhale 1 puff into the lungs daily. 28 each 5  ? Fluticasone-Umeclidin-Vilant (TRELEGY ELLIPTA) 200-62.5-25 MCG/INH AEPB Inhale 1 puff into the lungs daily. 60 each 5  ? hydrochlorothiazide (HYDRODIURIL) 25 MG tablet Take 1 tablet (25 mg total) by mouth daily. For high blood pressure. 90 tablet 1  ? hydroxychloroquine (PLAQUENIL) 200 MG tablet TAKE 1 TABLET BY MOUTH TWICE A DAY 60 tablet 1  ? ipratropium-albuterol (DUONEB) 0.5-2.5 (3) MG/3ML SOLN Take 3 mLs by nebulization every 6 (six) hours as needed. 360 mL 2  ? metFORMIN (GLUCOPHAGE XR) 500 MG 24 hr tablet Take 1 tablet (500 mg total) by mouth daily with breakfast. 90 tablet 1  ? metoCLOPramide (REGLAN) 10 MG tablet Take 10 mg by mouth every  morning.    ? montelukast (SINGULAIR) 10 MG tablet TAKE 1 TABLET BY MOUTH EVERYDAY AT BEDTIME 30 tablet 0  ? norethindrone (AYGESTIN) 5 MG tablet Take 1 tablet (5 mg total) by mouth daily. 90 tablet 4  ? ondanse

## 2021-08-28 ENCOUNTER — Ambulatory Visit: Payer: Medicaid Other | Admitting: Allergy & Immunology

## 2021-08-28 ENCOUNTER — Telehealth: Payer: Self-pay | Admitting: Pulmonary Disease

## 2021-08-28 ENCOUNTER — Encounter: Payer: Self-pay | Admitting: Internal Medicine

## 2021-08-28 ENCOUNTER — Ambulatory Visit (HOSPITAL_COMMUNITY): Payer: Medicaid Other

## 2021-08-28 ENCOUNTER — Other Ambulatory Visit: Payer: Self-pay | Admitting: *Deleted

## 2021-08-28 ENCOUNTER — Telehealth: Payer: Self-pay

## 2021-08-28 DIAGNOSIS — Z87898 Personal history of other specified conditions: Secondary | ICD-10-CM

## 2021-08-28 DIAGNOSIS — M48062 Spinal stenosis, lumbar region with neurogenic claudication: Secondary | ICD-10-CM

## 2021-08-28 DIAGNOSIS — R5381 Other malaise: Secondary | ICD-10-CM

## 2021-08-28 NOTE — Telephone Encounter (Signed)
Physical Medical Source/disability form ? ?Copied ?Noted ?sleeved ?

## 2021-08-28 NOTE — Telephone Encounter (Signed)
Home health order placed.

## 2021-08-29 ENCOUNTER — Telehealth: Payer: Self-pay | Admitting: Pulmonary Disease

## 2021-08-29 ENCOUNTER — Other Ambulatory Visit (HOSPITAL_COMMUNITY): Payer: Self-pay | Admitting: Internal Medicine

## 2021-08-29 ENCOUNTER — Other Ambulatory Visit: Payer: Self-pay | Admitting: Internal Medicine

## 2021-08-29 ENCOUNTER — Telehealth: Payer: Self-pay | Admitting: Internal Medicine

## 2021-08-29 DIAGNOSIS — Z1231 Encounter for screening mammogram for malignant neoplasm of breast: Secondary | ICD-10-CM

## 2021-08-29 MED ORDER — FAMOTIDINE 20 MG PO TABS: 20.0000 mg | ORAL_TABLET | Freq: Every day | ORAL | 0 refills | Status: DC

## 2021-08-29 NOTE — Telephone Encounter (Signed)
Form filled out and placed on Dr. Collie Siad desk for a signature. Will call patient and fax document once received by Dr. Halford Chessman. Routing to Dr. Halford Chessman to make him aware of document.  ?

## 2021-08-29 NOTE — Telephone Encounter (Signed)
[facility]. Called and notified patient. Nothing further needed ?

## 2021-08-29 NOTE — Telephone Encounter (Signed)
Meds refilled. Nothing further needed ?

## 2021-08-29 NOTE — Telephone Encounter (Signed)
Returned call to give verbal order  ?

## 2021-08-29 NOTE — Telephone Encounter (Signed)
Patient called asking surgical clearance form.  Form was faxed to [facility] rather than the [facility] office.  Since the form had no patient information, gave pt email address to send form to have Dr. Craige Cotta fill out to be faxed to office today or tomorrow.  Gave form to Meagan to have Dr. Craige Cotta fill out.  Please advise. ?

## 2021-08-29 NOTE — Telephone Encounter (Signed)
Carlota Raspberry, nurse with [facility] home [facility] called in and left voicemail on patient behalf, ? ?Called in for orders for patient to have physical therapy and occupational therapy. Also for a medical social worker for patient. ? ?Can leave verbal order on VM ? ?[phone] ?

## 2021-08-29 NOTE — Telephone Encounter (Signed)
Called and spoke to patient. Order no longer needed. Nothing further needed at this time.  ?

## 2021-08-29 NOTE — Telephone Encounter (Signed)
Pt calling back about order for pulse oximeter to check oxygen level.  States was sent to [facility] but they don't carry them.  Requested order be sent to [facility] on [address].  Order originally sent by Derl Barrow on 08/06/21.  Please advise. ?

## 2021-08-29 NOTE — Telephone Encounter (Signed)
Form signed.

## 2021-08-30 DIAGNOSIS — Z0279 Encounter for issue of other medical certificate: Secondary | ICD-10-CM

## 2021-09-01 ENCOUNTER — Other Ambulatory Visit: Payer: Self-pay | Admitting: Allergy & Immunology

## 2021-09-02 NOTE — Telephone Encounter (Signed)
Called patient left voicemail forms are ready and will be faxed to [phone]. ?

## 2021-09-04 ENCOUNTER — Encounter: Payer: Self-pay | Admitting: Allergy & Immunology

## 2021-09-04 ENCOUNTER — Ambulatory Visit (INDEPENDENT_AMBULATORY_CARE_PROVIDER_SITE_OTHER): Payer: Medicaid Other | Admitting: Allergy & Immunology

## 2021-09-04 VITALS — BP 120/90 | HR 86 | Temp 98.5°F | Resp 20 | Ht 64.0 in | Wt 270.6 lb

## 2021-09-04 DIAGNOSIS — J4550 Severe persistent asthma, uncomplicated: Secondary | ICD-10-CM

## 2021-09-04 DIAGNOSIS — J3089 Other allergic rhinitis: Secondary | ICD-10-CM | POA: Diagnosis not present

## 2021-09-04 DIAGNOSIS — J302 Other seasonal allergic rhinitis: Secondary | ICD-10-CM

## 2021-09-04 NOTE — Progress Notes (Signed)
? ?FOLLOW UP ? ?Date of Service/Encounter:  09/04/21 ? ? ?Assessment:  ? ?Severe persistent asthma, uncomplicated - with eosinophilic endotype (AEC 600 in February 2023) ?  ?Seasonal and perennial allergic rhinitis (grasses, ragweed, weeds, trees, indoor molds, outdoor molds, dust mites, cat and cockroach) - good candidate for allergen immunotherapy, but we want to get her breathing under better control first ?  ?COVID-19 pneumonia (November 2021) - with bilateral groundglass opacities on chest CT (follows with Dr. Craige Cotta) ?  ?COVID-19 vaccine hesitant ?  ?Rheumatoid arthritis - on Plaquenil ? ? ?Patient presents for follow-up around a year after I last saw her.  She has had a number of canceled appointments, but per the patient's he still remains interested in using Nucala for long-term control of her asthma.  She had a recent hospitalization and intubation which I think has freaked her out about her asthma diagnosis and caused her to come make an appointment.  She is going to have bariatric surgery in the next month and wants to get that taken care of first before starting the Nucala.  We will plan to start the Nucala at the end of May.  She is interested in allergy shots, but at this point I want to make sure that her breathing is under better control first. ?  ? ?Plan/Recommendations:  ? ?1. Seasonal and perennial allergic rhinitis (grasses, ragweed, weeds, trees, indoor molds, outdoor molds, dust mites, cat and cockroach) ?- Continue with: Singulair (montelukast)  daily and Flonase (fluticasone) one spray per nostril daily ?- Start taking Xyzal  daily (to replace the Zyrtec).  ?- We could always do allergy shots in the future if needed. ? ?2. Severe persistent asthma, uncomplicated ?- Lung testing showed a value of 65% today.  ?- We did give you Xopenex puffs in the clinic and you seemed to do better.  ?- Daily controller medication(s): Trelegy one puff once daily + Singulair (montelukast)   daily + Nucala monthly starting the end of May 2023 ?- Prior to physical activity: albuterol 2 puffs 10-15 minutes before physical activity. ?- Rescue medications: albuterol 4 puffs every 4-6 hours as needed ?- Asthma control goals:  ?* Full participation in all desired activities (may need albuterol before activity) ?* Albuterol use two time or less a week on average (not counting use with activity) ?* Cough interfering with sleep two time or less a month ?* Oral steroids no more than once a year ?* No hospitalizations ? ?3. Return in about 6 months (around 03/06/2022).  ? ?Subjective:  ? ?Elizabeth Bautista is a 46 y.o. female presenting today for follow up of  ?Chief Complaint  ?Patient presents with  ? Asthma  ?  Says she has had issues with her asthma all her life.   ? ? ?Elizabeth Bautista has a history of the following: ?Patient Active Problem List  ? Diagnosis Date Noted  ? Spinal stenosis of lumbar region with neurogenic claudication 08/27/2021  ? Fall 08/27/2021  ? Sinusitis 08/11/2021  ? Allergic rhinitis 08/07/2021  ? Hospital discharge follow-up 08/02/2021  ? (HFpEF) heart failure with preserved ejection fraction (HCC) 07/20/2021  ? Body mass index (BMI) 45.0-49.9, adult (HCC) 02/05/2021  ? Iron deficiency anemia due to chronic blood loss 10/17/2020  ? Tension-type headache, not intractable 10/17/2020  ? High risk medication use 10/11/2020  ? Acute hip pain, left 09/19/2020  ? Sciatica 09/11/2020  ? Dysmenorrhea 06/04/2020  ? Menorrhagia with irregular cycle 06/04/2020  ? Anxiety and depression 06/04/2020  ?  Physical deconditioning 05/24/2020  ? Severe persistent asthma 03/08/2020  ? GERD (gastroesophageal reflux disease) 03/07/2020  ? Anxiety 03/07/2020  ? History of seizures 03/07/2020  ? Swelling of lower extremity 02/01/2020  ? Essential hypertension 02/01/2020  ? Vitamin D deficiency 02/01/2020  ? Type 2 diabetes mellitus with hyperglycemia, without long-term current use of insulin (HCC) 02/01/2020  ? Anemia  02/01/2020  ? Class 3 severe obesity with serious comorbidity and body mass index (BMI) of 45.0 to 49.9 in adult [facility]) 10/22/2019  ? OSA on CPAP 10/18/2019  ? Seropositive rheumatoid arthritis (HCC) 10/18/2019  ? ? ?History obtained from: chart review and patient. ? ?Elizabeth Bautista is a 46 y.o. female presenting for a follow up visit. ? ?She presents today to discuss Nucala. ? ?Meantime, she has followed with Dr. Craige Cotta.  Per his last note, Markus Daft was not effective and Mikael Spray was too expensive.  She was continued on Trelegy 200 mcg 1 puff once daily. ? ?Asthma/Respiratory Symptom History: She was intubated for 3 days for her asthma.  This was the first time that she was intubated. She has had four hospitalizations/ED visits in total. She is wheezing today. She remains on the Trelegy. She took it this morning but she is making wheezing sounds today.  ? ?Allergic Rhinitis Symptom History: She does Zyrtec.  She is not using her nasal spray on a routine basis. Zyrtec did not help and in fact made her feel weird. She has never tried levocetirizine but is willing to give this a try.  ? ?She has her bariatric surgery scheduled for May 2023. She says that this is going to just be an overnight stay in the hospital. She should be able to start her Nucala at the end of May 2023.   ? ?She has a history of rheumatoid arthritis and sees Dr. Sheliah Hatch. ? ?Otherwise, there have been no changes to her past medical history, surgical history, family history, or social history. ? ? ? ?Review of Systems  ?Constitutional: Negative.  Negative for chills, fever, malaise/fatigue and weight loss.  ?HENT: Negative.  Negative for congestion, ear discharge and ear pain.   ?Eyes:  Negative for pain, discharge and redness.  ?Respiratory:  Negative for cough, sputum production, shortness of breath and wheezing.   ?Cardiovascular: Negative.  Negative for chest pain and palpitations.  ?Gastrointestinal:  Negative for abdominal pain, constipation,  diarrhea, heartburn, nausea and vomiting.  ?Skin: Negative.  Negative for itching and rash.  ?Neurological:  Negative for dizziness and headaches.  ?Endo/Heme/Allergies:  Negative for environmental allergies. Does not bruise/bleed easily.   ? ? ? ?Objective:  ? ?Blood pressure 120/90, pulse 86, temperature 98.5 ?F (36.9 ?C), temperature source Temporal, resp. rate 20, height  (1.626 m), weight 270 lb 9.6 oz (122.7 kg), SpO2 98 %. ?Body mass index is 46.45 kg/m?. ? ? ? ?Physical Exam ?Constitutional:   ?   Appearance: She is well-developed.  ?HENT:  ?   Head: Normocephalic and atraumatic.  ?   Right Ear: Tympanic membrane, ear canal and external ear normal.  ?   Left Ear: Tympanic membrane, ear canal and external ear normal.  ?   Nose: No nasal deformity, septal deviation, mucosal edema or rhinorrhea.  ?   Right Turbinates: Enlarged and swollen.  ?   Left Turbinates: Enlarged and swollen.  ?   Right Sinus: No maxillary sinus tenderness or frontal sinus tenderness.  ?   Left Sinus: No maxillary sinus tenderness or frontal sinus tenderness.  ?  Mouth/Throat:  ?   Mouth: Mucous membranes are not pale and not dry.  ?   Pharynx: Uvula midline.  ?Eyes:  ?   General:     ?   Right eye: No discharge.     ?   Left eye: No discharge.  ?   Conjunctiva/sclera: Conjunctivae normal.  ?   Right eye: Right conjunctiva is not injected. No chemosis. ?   Left eye: Left conjunctiva is not injected. No chemosis. ?   Pupils: Pupils are equal, round, and reactive to light.  ?Cardiovascular:  ?   Rate and Rhythm: Normal rate and regular rhythm.  ?   Heart sounds: Normal heart sounds.  ?Pulmonary:  ?   Effort: Pulmonary effort is normal. No tachypnea, accessory muscle usage or respiratory distress.  ?   Breath sounds: Wheezing present. No rhonchi or rales.  ?   Comments: Audibly wheezing.  Somewhat decreased air movement at bases.  No increased work of breathing. ?Chest:  ?   Chest wall: No tenderness.  ?Lymphadenopathy:  ?   Cervical:  No cervical adenopathy.  ?Skin: ?   General: Skin is warm.  ?   Capillary Refill: Capillary refill takes less than 2 seconds.  ?   Coloration: Skin is not pale.  ?   Findings: No abrasion, erythema, petechiae or

## 2021-09-04 NOTE — Patient Instructions (Addendum)
1. Seasonal and perennial allergic rhinitis (grasses, ragweed, weeds, trees, indoor molds, outdoor molds, dust mites, cat and cockroach) ?- Continue with: Singulair (montelukast) 10mg  daily and Flonase (fluticasone) one spray per nostril daily ?- Start taking Xyzal 5mg  daily (to replace the Zyrtec).  ?- We could always do allergy shots in the future if needed. ? ?2. Severe persistent asthma, uncomplicated ?- Lung testing showed a value of 65% today.  ?- We did give you Xopenex puffs in the clinic and you seemed to do better.  ?- Daily controller medication(s): Trelegy 200mcg one puff once daily + Singulair (montelukast) 10mg  daily + Nucala monthly starting the end of May 2023 ?- Prior to physical activity: albuterol 2 puffs 10-15 minutes before physical activity. ?- Rescue medications: albuterol 4 puffs every 4-6 hours as needed ?- Asthma control goals:  ?* Full participation in all desired activities (may need albuterol before activity) ?* Albuterol use two time or less a week on average (not counting use with activity) ?* Cough interfering with sleep two time or less a month ?* Oral steroids no more than once a year ?* No hospitalizations ? ?3. Return in about 6 months (around 03/06/2022).  ? ? ?Please inform us of any Emergency Department visits, hospitalizations, or changes in symptoms. Call us before going to the ED for breathing or allergy symptoms since we might be able to fit you in for a sick visit. Feel free to contact us anytime with any questions, problems, or concerns. ? ?It was a pleasure to see you again today! ? ?Websites that have reliable patient information: ?1. American Academy of Asthma, Allergy, and Immunology: www.aaaai.org ?2. Food Allergy Research and Education (FARE): foodallergy.org ?3. Mothers of Asthmatics: http://www.asthmacommunitynetwork.org ?4. [employer] of Allergy, Asthma, and Immunology: MissingWeapons.cawww.acaai.org ? ? ?COVID-19 Vaccine Information can be found at:  [email or web address] For questions related to vaccine distribution or appointments, please email [email or web address] or call [phone].  ? ?We realize that you might be concerned about having an allergic reaction to the COVID19 vaccines. To help with that concern, WE ARE OFFERING THE COVID19 VACCINES IN OUR OFFICE! Ask the front desk for dates!  ? ? ? ??Like? us on Facebook and Instagram for our latest updates!  ?  ? ? ?A healthy democracy works best when [employer] voters participate! Make sure you are registered to vote! If you have moved or changed any of your contact information, you will need to get this updated before voting! ? ?In some cases, you MAY be able to register to vote online: [email or web address]-[email or web address]-Vote ? ? ? ? ?Allergy Shots  ? ?Allergies are the result of a chain reaction that starts in the immune system. Your immune system controls how your body defends itself. For instance, if you have an allergy to pollen, your immune system identifies pollen as an invader or allergen. Your immune system overreacts by producing antibodies called Immunoglobulin E (IgE). These antibodies travel to cells that release chemicals, causing an allergic reaction. ? ?The concept behind allergy immunotherapy, whether it is received in the form of shots or tablets, is that the immune system can be desensitized to specific allergens that trigger allergy symptoms. Although it requires time and patience, the payback can be long-term relief. ? ?How Do Allergy Shots Work? ? ?Allergy shots work much like a vaccine. Your body responds to injected amounts of a particular allergen given in increasing doses, eventually developing a resistance and tolerance to it. Allergy shots can lead to decreased, minimal or no allergy symptoms. ? ?  There generally are two phases: build-up and maintenance. Build-up often ranges from three to six months and involves  receiving injections with increasing amounts of the allergens. The shots are typically given once or twice a week, though more rapid build-up schedules are sometimes used. ? ?The maintenance phase begins when the most effective dose is reached. This dose is different for each person, depending on how allergic you are and your response to the build-up injections. Once the maintenance dose is reached, there are longer periods between injections, typically two to four weeks. ? ?Occasionally doctors give cortisone-type shots that can temporarily reduce allergy symptoms. These types of shots are different and should not be confused with allergy immunotherapy shots. ? ?Who Can Be Treated with Allergy Shots? ? ?Allergy shots may be a good treatment approach for people with allergic rhinitis (hay fever), allergic asthma, conjunctivitis (eye allergy) or stinging insect allergy.  ? ?Before deciding to begin allergy shots, you should consider: ? ? The length of allergy season and the severity of your symptoms ? Whether medications and/or changes to your environment can control your symptoms ? Your desire to avoid long-term medication use ? Time: allergy immunotherapy requires a major time commitment ? Cost: may vary depending on your insurance coverage ? ?Allergy shots for children age 86 and older are effective and often well tolerated. They might prevent the onset of new allergen sensitivities or the progression to asthma. ? ?Allergy shots are not started on patients who are pregnant but can be continued on patients who become pregnant while receiving them. In some patients with other medical conditions or who take certain common medications, allergy shots may be of risk. It is important to mention other medications you talk to your allergist.  ? ?When Will I Feel Better? ? ?Some may experience decreased allergy symptoms during the build-up phase. For others, it may take as long as 12 months on the maintenance dose. If  there is no improvement after a year of maintenance, your allergist will discuss other treatment options with you. ? ?If you aren?t responding to allergy shots, it may be because there is not enough dose of the allergen in your vaccine or there are missing allergens that were not identified during your allergy testing. Other reasons could be that there are high levels of the allergen in your environment or major exposure to non-allergic triggers like tobacco smoke. ? ?What Is the Length of Treatment? ? ?Once the maintenance dose is reached, allergy shots are generally continued for three to five years. The decision to stop should be discussed with your allergist at that time. Some people may experience a permanent reduction of allergy symptoms. Others may relapse and a longer course of allergy shots can be considered. ? ?What Are the Possible Reactions? ? ?The two types of adverse reactions that can occur with allergy shots are local and systemic. Common local reactions include very mild redness and swelling at the injection site, which can happen immediately or several hours after. A systemic reaction, which is less common, affects the entire body or a particular body system. They are usually mild and typically respond quickly to medications. Signs include increased allergy symptoms such as sneezing, a stuffy nose or hives. ? ?Rarely, a serious systemic reaction called anaphylaxis can develop. Symptoms include swelling in the throat, wheezing, a feeling of tightness in the chest, nausea or dizziness. Most serious systemic reactions develop within 30 minutes of allergy shots. This is why it is strongly recommended you  wait in your doctor?s office for 30 minutes after your injections. Your allergist is trained to watch for reactions, and his or her staff is trained and equipped with the proper medications to identify and treat them. ? ?Who Should Administer Allergy Shots? ? ?The preferred location for receiving shots  is your prescribing allergist?s office. Injections can sometimes be given at another facility where the physician and staff are trained to recognize and treat reactions, and have received instructions by your prescribing allergist. ?

## 2021-09-05 ENCOUNTER — Ambulatory Visit (HOSPITAL_COMMUNITY)
Admission: RE | Admit: 2021-09-05 | Discharge: 2021-09-05 | Disposition: A | Discharge status: Home / Self Care | Payer: Medicaid Other | Source: Ambulatory Visit | Attending: Internal Medicine | Admitting: Internal Medicine

## 2021-09-05 DIAGNOSIS — Z1231 Encounter for screening mammogram for malignant neoplasm of breast: Secondary | ICD-10-CM | POA: Insufficient documentation

## 2021-09-05 IMAGING — MG MM DIGITAL SCREENING BILAT W/ TOMO AND CAD
8 of 14 series · 8 of 40 positions shown · non-contrast
Comparison: Previous exam(s).

ACR Breast Density Category a: The breast tissue is almost entirely
fatty.

CLINICAL DATA: Screening.

EXAM:
DIGITAL SCREENING BILATERAL MAMMOGRAM WITH TOMOSYNTHESIS AND CAD
TECHNIQUE: Bilateral screening digital craniocaudal and mediolateral oblique
mammograms were obtained. Bilateral screening digital breast
tomosynthesis was performed. The images were evaluated with
computer-aided detection.

[R MLO synth-2D (1 of 2)]
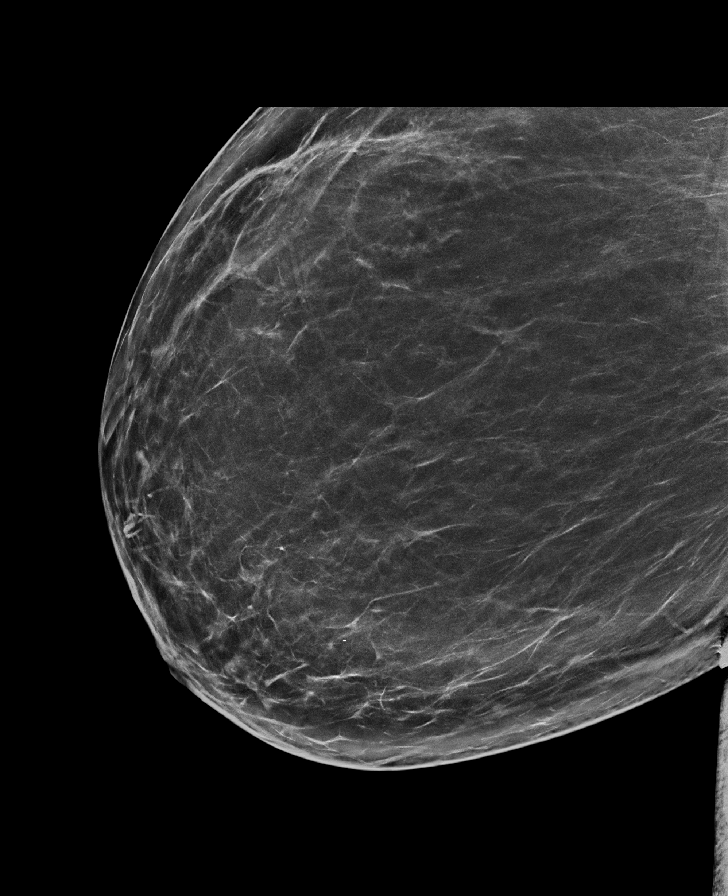

[R CC synth-2D (1 of 2)]
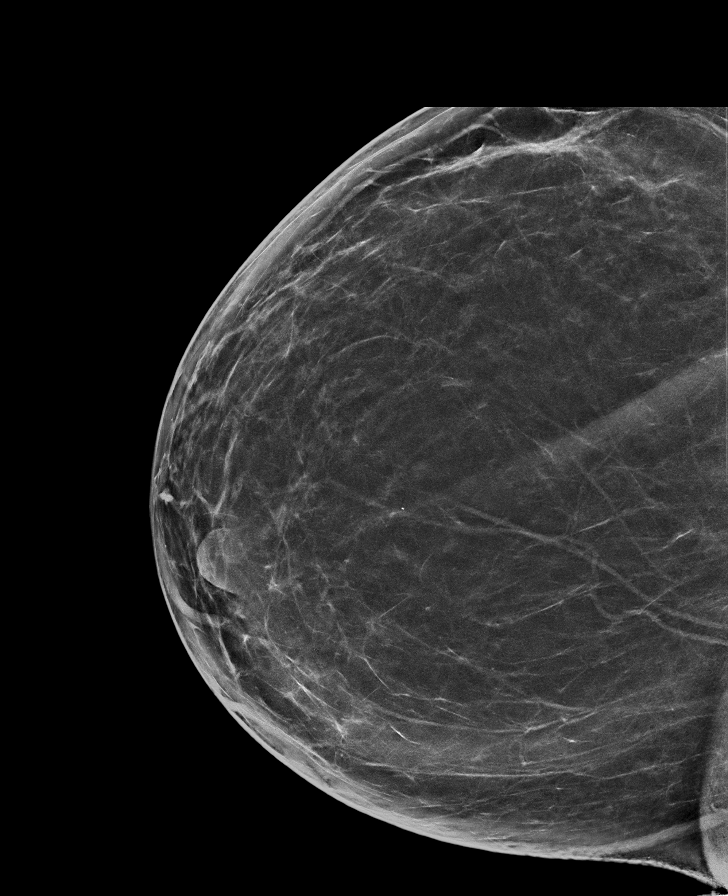

[L MLO synth-2D]
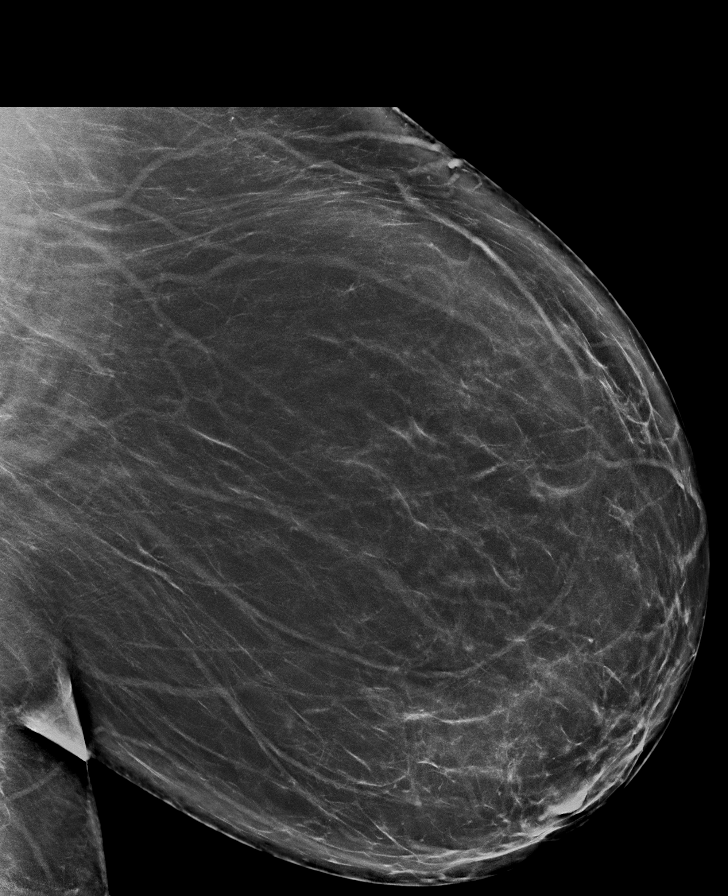

[R MLO synth-2D (2 of 2)]
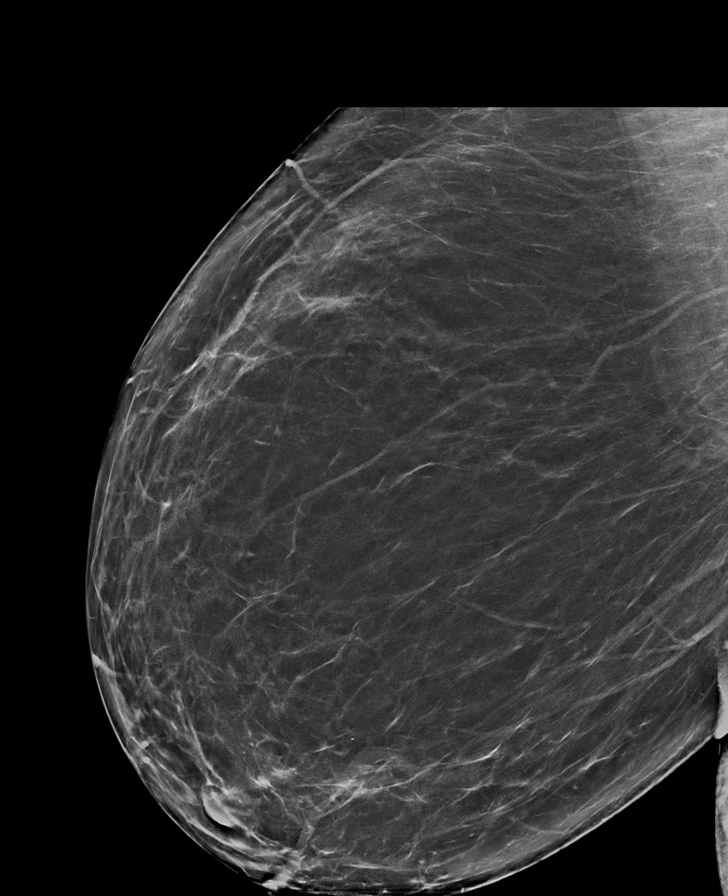

[L CC synth-2D (1 of 2)]
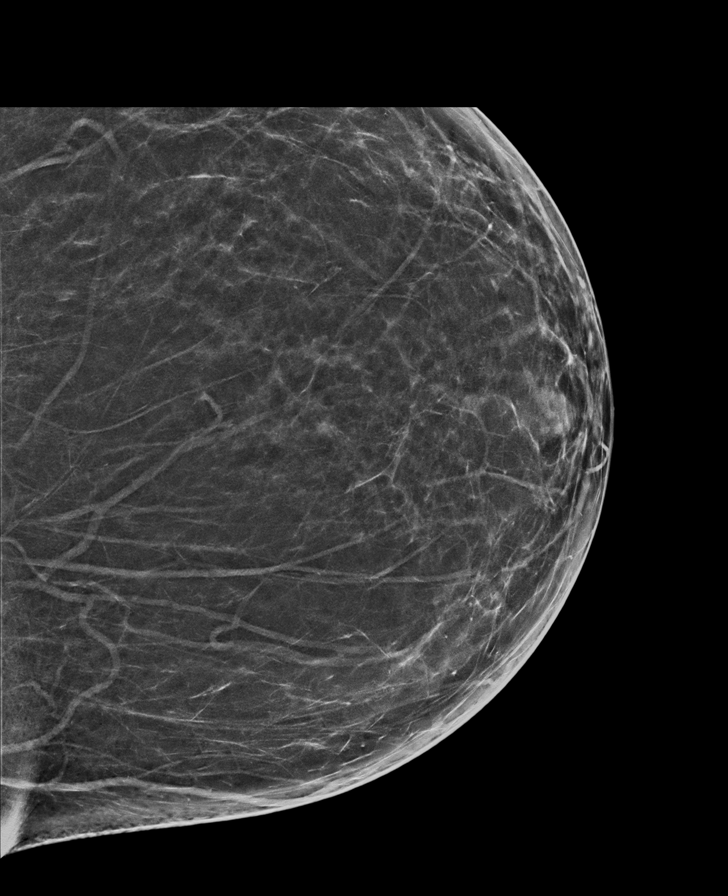

[R CC synth-2D (2 of 2)]
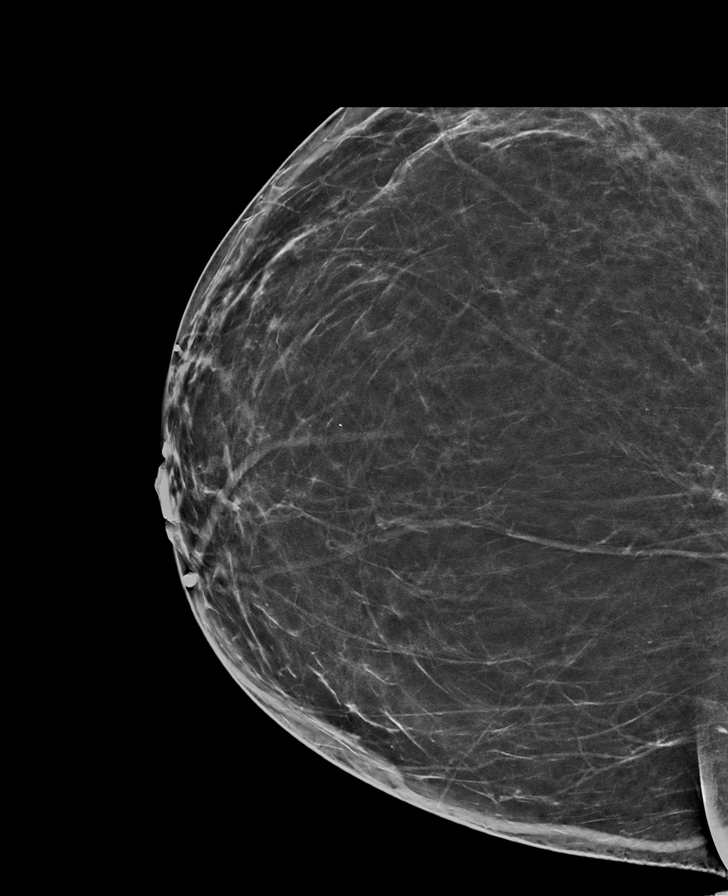

[L CC synth-2D (2 of 2)]
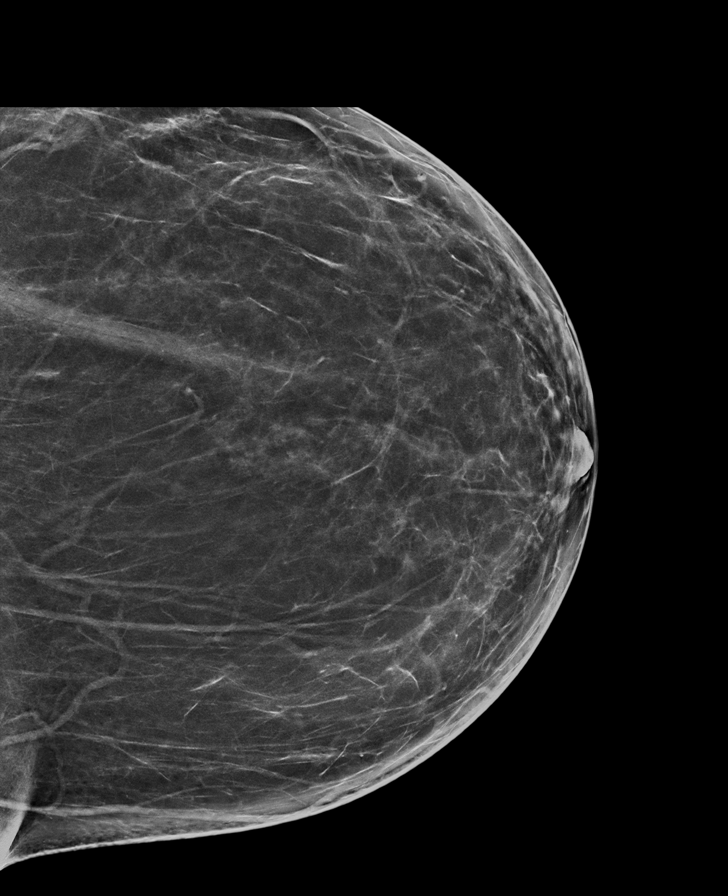

[L CC tomo · tomo slice 43/85.0]
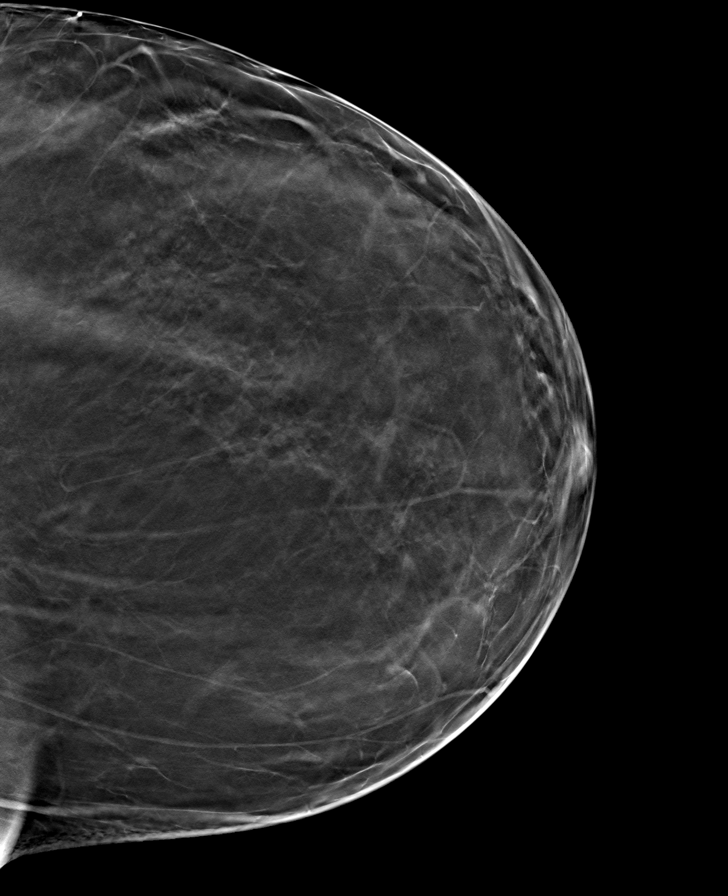

[8 of 40 positions shown; findings below may reference images not displayed]

FINDINGS: There are no findings suspicious for malignancy.
IMPRESSION: No mammographic evidence of malignancy. A result letter of this
screening mammogram will be mailed directly to the patient.

RECOMMENDATION:
Screening mammogram in one year. (Code:[8U])

BI-RADS CATEGORY  1: Negative.

## 2021-09-05 NOTE — Telephone Encounter (Signed)
Patient pick-up forms.

## 2021-09-09 ENCOUNTER — Ambulatory Visit: Payer: Medicaid Other | Admitting: Internal Medicine

## 2021-09-09 ENCOUNTER — Telehealth: Payer: Self-pay

## 2021-09-09 NOTE — Telephone Encounter (Signed)
Onalee Huaavid given verbal orders for PT ?

## 2021-09-09 NOTE — Telephone Encounter (Signed)
Elizabeth Bautista with [facility] calling to get verbal order for physical therapy. ? ?Call  back # [phone] ?

## 2021-09-11 ENCOUNTER — Telehealth: Payer: Self-pay | Admitting: *Deleted

## 2021-09-11 ENCOUNTER — Telehealth: Payer: Self-pay

## 2021-09-11 NOTE — Telephone Encounter (Signed)
Called patient and advised approval and resubmit for Nucala to [facility] and will reach once delivery set to make appt to restart therapy ?

## 2021-09-11 NOTE — Telephone Encounter (Signed)
Awesome - thank you, Tam Tam!   Taylee Gunnells, MD [facility]  

## 2021-09-11 NOTE — Telephone Encounter (Signed)
Joelene Millin Occupational therapist with [facility]. Verbal order for 1 week 4    for ADL, pain management, activity tolerance, transfers, fall prevention, eye ADL's and home exercise program ? ?Joelene Millin call back # [phone] ?

## 2021-09-11 NOTE — Telephone Encounter (Signed)
Verbal orders given  

## 2021-09-12 LAB — HM DIABETES EYE EXAM

## 2021-09-13 DIAGNOSIS — G8929 Other chronic pain: Secondary | ICD-10-CM

## 2021-09-13 DIAGNOSIS — M48061 Spinal stenosis, lumbar region without neurogenic claudication: Secondary | ICD-10-CM

## 2021-09-13 DIAGNOSIS — M545 Low back pain, unspecified: Secondary | ICD-10-CM

## 2021-09-13 DIAGNOSIS — M059 Rheumatoid arthritis with rheumatoid factor, unspecified: Secondary | ICD-10-CM

## 2021-09-13 DIAGNOSIS — J4550 Severe persistent asthma, uncomplicated: Secondary | ICD-10-CM | POA: Diagnosis not present

## 2021-09-13 DIAGNOSIS — G40909 Epilepsy, unspecified, not intractable, without status epilepticus: Secondary | ICD-10-CM

## 2021-09-13 DIAGNOSIS — Z7984 Long term (current) use of oral hypoglycemic drugs: Secondary | ICD-10-CM | POA: Diagnosis not present

## 2021-09-13 DIAGNOSIS — R5381 Other malaise: Secondary | ICD-10-CM | POA: Diagnosis not present

## 2021-09-13 DIAGNOSIS — I5030 Unspecified diastolic (congestive) heart failure: Secondary | ICD-10-CM

## 2021-09-13 DIAGNOSIS — I110 Hypertensive heart disease with heart failure: Secondary | ICD-10-CM | POA: Diagnosis not present

## 2021-09-13 DIAGNOSIS — D649 Anemia, unspecified: Secondary | ICD-10-CM

## 2021-09-17 ENCOUNTER — Encounter: Payer: Self-pay | Admitting: Physical Medicine & Rehabilitation

## 2021-09-17 ENCOUNTER — Encounter: Payer: Medicaid Other | Attending: Physical Medicine & Rehabilitation | Admitting: Physical Medicine & Rehabilitation

## 2021-09-17 VITALS — BP 141/84 | HR 72 | Ht 64.0 in | Wt 278.0 lb

## 2021-09-17 DIAGNOSIS — M797 Fibromyalgia: Secondary | ICD-10-CM | POA: Insufficient documentation

## 2021-09-17 MED ORDER — PREGABALIN 200 MG PO CAPS: 200.0000 mg | ORAL_CAPSULE | Freq: Three times a day (TID) | ORAL | 2 refills | Status: DC

## 2021-09-17 NOTE — Progress Notes (Addendum)
? ?Subjective:  ? ? Patient ID: Elizabeth Bautista, female    DOB: 10-Dec-1975, 46 y.o.   MRN: [id number] ? ?HPI ?46 year old female with history of seronegative arthritis who returns today with worsening of her widespread body pain.  She states that it involves her upper back lower back mid back shoulders, elbows, hands, neck head and hips, feet, legs. ?Pain is relieved by rest but exacerbated by everything else.  Sleep is poor.  She has no diurnal variation with pain.  Pain interferes scores however are low ?Interval medical history positive for hospitalization for respiratory distress related to her asthma. ?Because of this hospitalization in February, her bariatric surgery in March was postponed until 10/14/2021, laparoscopic Roux-en-Y bypass. ?Pain Inventory ?Average Pain 10 ?Pain Right Now 9 ?My pain is sharp, burning, stabbing, tingling, and aching ? ?In the last 24 hours, has pain interfered with the following? ?General activity 1 ?Relation with others 2 ?Enjoyment of life 0 ?What TIME of day is your pain at its worst? morning , daytime, evening, and night ?Sleep (in general) Poor ? ?Pain is worse with: walking, bending, sitting, standing, and some activites ?Pain improves with: rest ?Relief from Meds: 2 ? ?Family History  ?Problem Relation Age of Onset  ? High blood pressure Mother   ? Rheum arthritis Mother   ? Diabetes Father   ? Diabetes Sister   ? Heart Problems Sister   ? Diabetes Brother   ? Diabetes Paternal Grandmother   ? Diabetes Other   ?     father's side "everybody died from Diabetes"  ? High blood pressure Other   ?     mother's side, multiple siblings with this   ? Heart attack Other   ?     family member on mother's side   ? Diabetes Paternal Aunt   ? Seizures Cousin   ?     not sibings to the other cousins with seizures  ? Breast cancer Cousin   ? Seizures Cousin   ?     not sibings to the other cousins with seizures  ? Seizures Cousin   ?     not sibings to the other cousins with seizures  ?  Cervical cancer Maternal Aunt   ? Breast cancer Cousin   ? Dementia Maternal Aunt   ? Asthma Maternal Aunt   ? Heart Problems Maternal Grandmother   ? Diabetes Brother   ? Asthma Daughter   ? Angioedema Daughter   ? Asthma Son   ? ?Social History  ? ?Socioeconomic History  ? Marital status: Single  ?  Spouse name: Not on file  ? Number of children: 5  ? Years of education: Not on file  ? Highest education level: 9th grade  ?Occupational History  ? Not on file  ?Tobacco Use  ? Smoking status: Former  ?  Years: 10.00  ?  Types: Cigarettes  ?  Quit date: 06/02/2013  ?  Years since quitting: 8.2  ? Smokeless tobacco: Never  ? Tobacco comments:  ?  during the 10 years of smoking, smoked 2-3 cigarettes/day  ?Vaping Use  ? Vaping Use: Never used  ?Substance and Sexual Activity  ? Alcohol use: Not Currently  ?  Comment: occ  ? Drug use: Not Currently  ? Sexual activity: Not Currently  ?  Birth control/protection: Surgical  ?  Comment: tubal  ?Other Topics Concern  ? Not on file  ?Social History Narrative  ? Divorced.Lives with 3 kids.Originally from [place].Came  from [place] 7 months ago.  ?   ? 02/27/2020  ? Right handed  ? Caffeine: none   ? ?Social Determinants of Health  ? ?Financial Resource Strain: High Risk  ? Difficulty of Paying Living Expenses: Very hard  ?Food Insecurity: Food Insecurity Present  ? Worried About Charity fundraiser in the Last Year: Sometimes true  ? Ran Out of Food in the Last Year: Often true  ?Transportation Needs: No Transportation Needs  ? Lack of Transportation (Medical): No  ? Lack of Transportation (Non-Medical): No  ?Physical Activity: Insufficiently Active  ? Days of Exercise per Week: 1 day  ? Minutes of Exercise per Session: 10 min  ?Stress: Stress Concern Present  ? Feeling of Stress : Very much  ?Social Connections: Socially Isolated  ? Frequency of Communication with Friends and Family: Once a week  ? Frequency of Social Gatherings with Friends and Family: Never  ? Attends Religious  Services: Never  ? Active Member of Clubs or Organizations: No  ? Attends Archivist Meetings: Never  ? Marital Status: Divorced  ? ?Past Surgical History:  ?Procedure Laterality Date  ? APPENDECTOMY    ? CESAREAN SECTION    ? x3  ? ?Past Surgical History:  ?Procedure Laterality Date  ? APPENDECTOMY    ? CESAREAN SECTION    ? x3  ? ?Past Medical History:  ?Diagnosis Date  ? Acute respiratory failure with hypoxia ([place])   ? Anemia   ? Asthma   ? COVID-19 virus infection 05/16/2020  ? 04/27/20 dx covid-19, not hospitalized  ? Diabetes ([place])   ? on Metformin  ? Eczema   ? Edema   ? Heart problem   ? "something with the arteries on the left side of the heart"; upcoming appt with cardiology for evaluation  ? HTN (hypertension)   ? Rheumatoid arthritis ([place])   ? on Plaquenil   ? Seizures ([place])   ? last seizure 2019  ? Sleep apnea   ? Sleep apnea   ? ?BP (!) 141/84   Pulse 72   Ht 5\' 4"  (1.626 m)   Wt 278 lb (126.1 kg)   LMP 09/05/2021   SpO2 96%   BMI 47.72 kg/m?  ? ?Opioid Risk Score:   ?Fall Risk Score:  `1 ? ?Depression screen PHQ 2/9 ? ? ?  09/17/2021  ? 10:49 AM 08/27/2021  ?  1:07 PM 08/08/2021  ?  2:46 PM 08/02/2021  ?  9:45 AM 07/11/2021  ? 12:58 PM 05/30/2021  ?  8:07 AM 05/16/2021  ?  2:01 PM  ?Depression screen PHQ 2/9  ?Decreased Interest 0 3 3 3 2  0 3  ?Down, Depressed, Hopeless 0 3 3 3 2 3 3   ?PHQ - 2 Score 0 6 6 6 4 3 6   ?Altered sleeping  3 3 3  3 3   ?Tired, decreased energy  3 3 3  3 3   ?Change in appetite  3 2 1  1 1   ?Feeling bad or failure about yourself   3 3 3  3 3   ?Trouble concentrating  3 3 1  3 3   ?Moving slowly or fidgety/restless  2 2 0  3 3  ?Suicidal thoughts  0 0 0  0 0  ?PHQ-9 Score  23 22 17  19 22   ?Difficult doing work/chores  Extremely dIfficult  Very difficult  Very difficult Somewhat difficult  ?  ? ?Review of Systems  ?Musculoskeletal:  Positive for  back pain.  ?     Joint pain  ?All other systems reviewed and are negative. ? ?   ?Objective:  ? Physical Exam ?Vitals and  nursing note reviewed.  ?Constitutional:   ?   Appearance: She is obese.  ?HENT:  ?   Head: Normocephalic and atraumatic.  ?Eyes:  ?   Extraocular Movements: Extraocular movements intact.  ?   Conjunctiva/sclera: Conjunctivae normal.  ?   Pupils: Pupils are equal, round, and reactive to light.  ?Musculoskeletal:  ?   Cervical back: Normal range of motion. Muscular tenderness present.  ?   Comments: Tenderness to minimal palpation bilateral upper trapezius cervical thoracic and lumbar paraspinals. ?Normal range of motion bilateral shoulders knees ankles limited at the hips due to pain.  Reduced lumbar movement secondary to pain.  ?Neurological:  ?   Mental Status: She is alert and oriented to person, place, and time.  ?   Cranial Nerves: Cranial nerves 2-12 are intact.  ?   Sensory: Sensation is intact.  ?   Coordination: Coordination is intact.  ?   Comments: Patient has 4/5 strength but with giveaway weakness in bilateral upper and lower extremities ?Deep tendon reflexes are normal. ?No evidence of spasticity ?No evidence of resting tremor  ?Psychiatric:     ?   Attention and Perception: Attention normal.     ?   Mood and Affect: Affect is angry.     ?   Behavior: Behavior is slowed. Behavior is cooperative.     ?   Thought Content: Thought content normal.     ?   Cognition and Memory: Cognition normal.     ?   Judgment: Judgment normal.  ? ? ? ? ? ?   ?Assessment & Plan:  ? ?1.  Fibromyalgia causing widespread body pain with diffuse muscle soreness with even very light palpation.  Remainder of neurologic exam is unremarkable.  Her weakness is related to pain complaints with resisted movements. ?We discussed the diagnosis of fibromyalgia syndrome.  She certainly fits in this category.  We also discussed that anxiety and depression can worsen this type of pain.  In addition she has had recent hospitalization and is likely deconditioned from this.  I agree with her physical therapy that is ongoing at this time. ?I do  think bariatric surgery with weight loss should improve her mobility and help with her fibromyalgia symptoms.  We discussed that she is already on 2 different fibromyalgia medications at the maximum effective dosages.

## 2021-09-18 ENCOUNTER — Ambulatory Visit: Payer: Medicaid Other | Admitting: Pulmonary Disease

## 2021-09-18 ENCOUNTER — Telehealth: Payer: Self-pay | Admitting: Internal Medicine

## 2021-09-18 ENCOUNTER — Ambulatory Visit (HOSPITAL_COMMUNITY): Payer: Medicaid Other | Admitting: Physical Therapy

## 2021-09-18 NOTE — Telephone Encounter (Signed)
Elizabeth Bautista , with [facility] [phone]) called in on patient behalf. ? ? ?Received plan of care signed on 4/14, unable to read. ? ?Requesting new plan of care to be faxed [phone]. ?

## 2021-09-18 NOTE — Telephone Encounter (Signed)
This was sent to scan therefore we no longer have this you can have her fax new copy to be signed  ?

## 2021-09-20 ENCOUNTER — Telehealth: Payer: Self-pay | Admitting: Internal Medicine

## 2021-09-20 NOTE — Telephone Encounter (Signed)
Elizabeth Bautista PTA  [phone]) with [facility] called in on patient  behalf in regard to missed home health therapy session.  ? ? ?Fyi  ?

## 2021-09-21 ENCOUNTER — Emergency Department (HOSPITAL_COMMUNITY): Payer: Medicaid Other

## 2021-09-21 ENCOUNTER — Other Ambulatory Visit: Payer: Self-pay | Admitting: Neurology

## 2021-09-21 ENCOUNTER — Encounter (HOSPITAL_COMMUNITY): Payer: Self-pay | Admitting: Emergency Medicine

## 2021-09-21 ENCOUNTER — Emergency Department (HOSPITAL_COMMUNITY)
Admission: EM | Admit: 2021-09-21 | Discharge: 2021-09-21 | Disposition: A | Discharge status: Home / Self Care | Payer: Medicaid Other | Attending: Emergency Medicine | Admitting: Emergency Medicine

## 2021-09-21 ENCOUNTER — Other Ambulatory Visit: Payer: Self-pay

## 2021-09-21 DIAGNOSIS — Z7984 Long term (current) use of oral hypoglycemic drugs: Secondary | ICD-10-CM | POA: Diagnosis not present

## 2021-09-21 DIAGNOSIS — X509XXA Other and unspecified overexertion or strenuous movements or postures, initial encounter: Secondary | ICD-10-CM | POA: Insufficient documentation

## 2021-09-21 DIAGNOSIS — Z7951 Long term (current) use of inhaled steroids: Secondary | ICD-10-CM | POA: Insufficient documentation

## 2021-09-21 DIAGNOSIS — I509 Heart failure, unspecified: Secondary | ICD-10-CM | POA: Insufficient documentation

## 2021-09-21 DIAGNOSIS — I110 Hypertensive heart disease with heart failure: Secondary | ICD-10-CM | POA: Diagnosis not present

## 2021-09-21 DIAGNOSIS — E119 Type 2 diabetes mellitus without complications: Secondary | ICD-10-CM | POA: Diagnosis not present

## 2021-09-21 DIAGNOSIS — Z79899 Other long term (current) drug therapy: Secondary | ICD-10-CM | POA: Diagnosis not present

## 2021-09-21 DIAGNOSIS — M79604 Pain in right leg: Secondary | ICD-10-CM | POA: Insufficient documentation

## 2021-09-21 DIAGNOSIS — J45909 Unspecified asthma, uncomplicated: Secondary | ICD-10-CM | POA: Diagnosis not present

## 2021-09-21 IMAGING — DX DG TIBIA/FIBULA 2V*R*
2 series · 2 of 2 positions shown · non-contrast
Comparison: None.

CLINICAL DATA: Knee pain related to injury a few days ago.

EXAM:
RIGHT TIBIA AND FIBULA - 2 VIEW

[tibia ap]
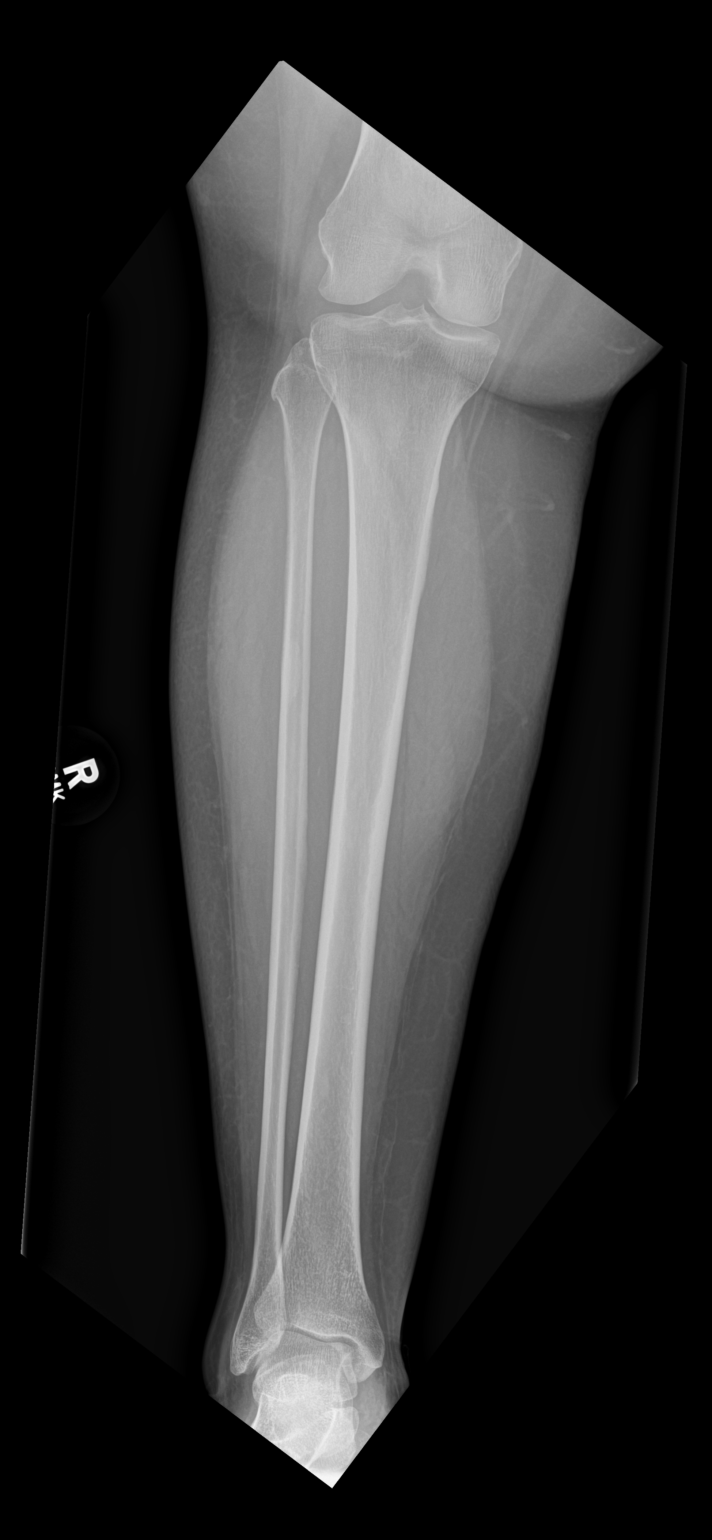

[tibia lat]
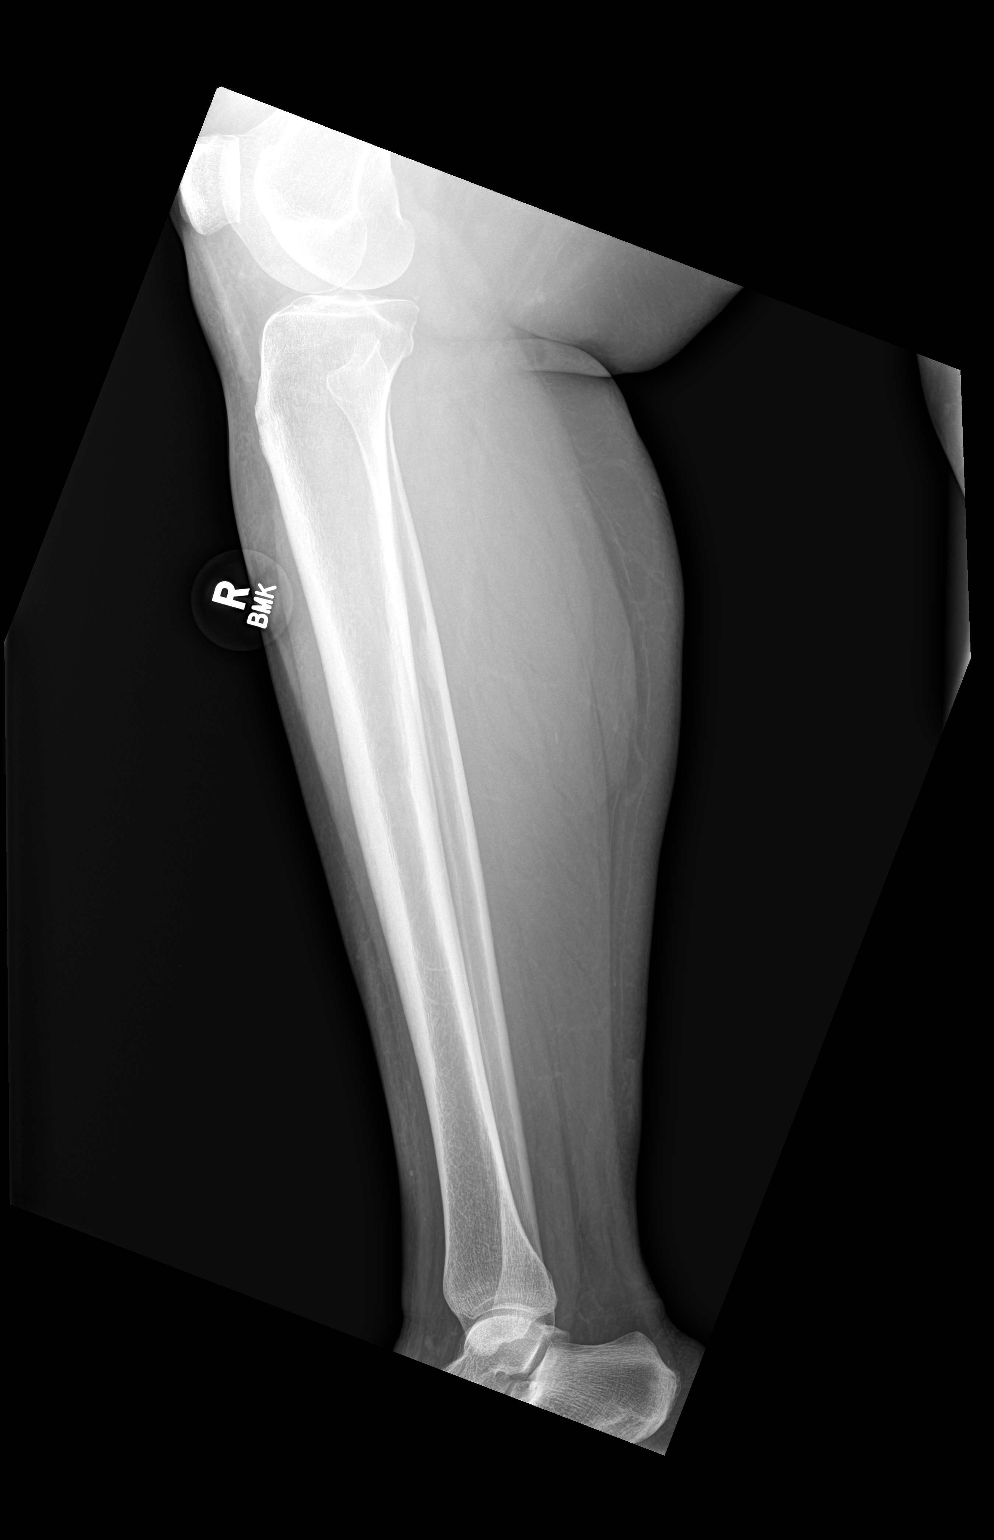

[2 of 2 positions shown; findings below may reference images not displayed]

FINDINGS: No fracture or subluxation. No significant degenerative spurring or
acute soft tissue finding. Benign medullary sclerosis at the fibular
shaft.
IMPRESSION: No acute finding or significant degenerative spurring.

## 2021-09-21 IMAGING — DX DG KNEE COMPLETE 4+V*R*
4 series · 4 of 4 positions shown · non-contrast
Comparison: None.

CLINICAL DATA: Right knee injury a few days ago when kneeling.
Pain.

EXAM:
RIGHT KNEE - COMPLETE 4+ VIEW

[knee ap]
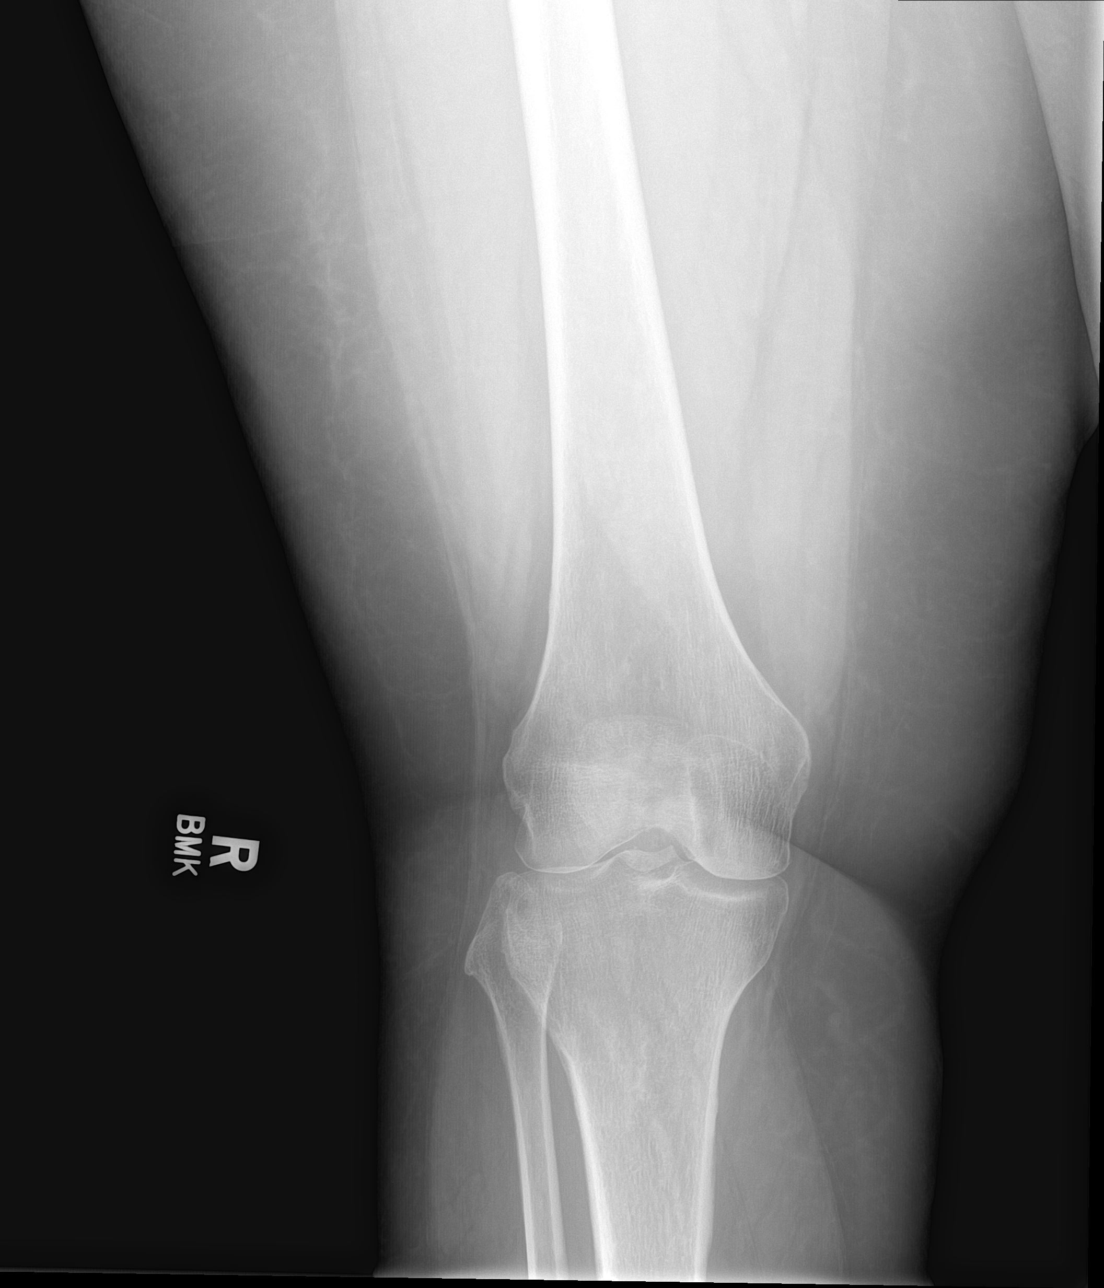

[knee obl (1 of 2)]
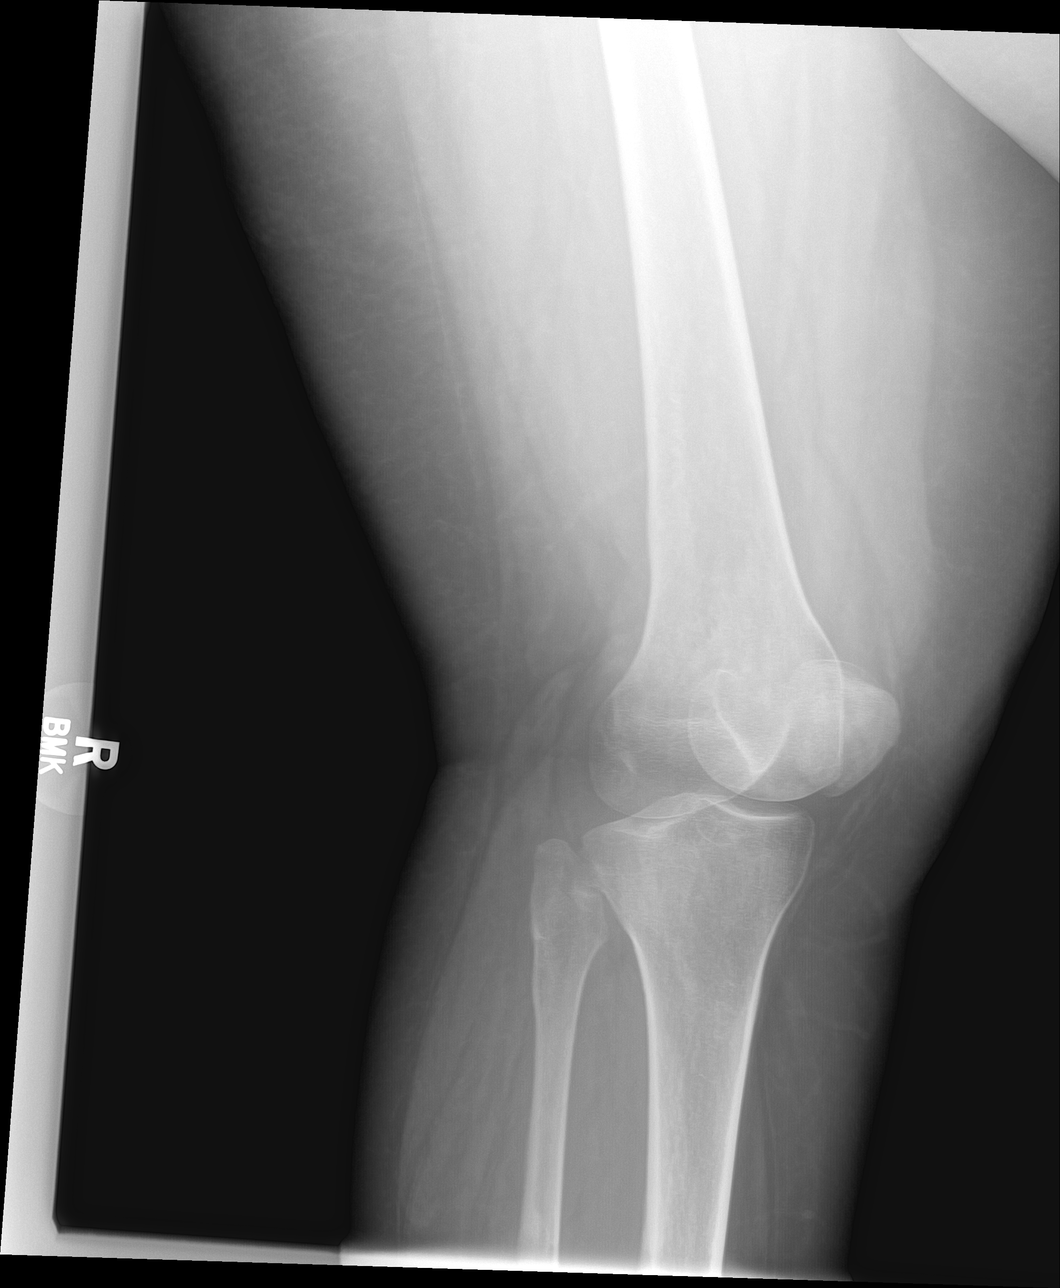

[knee obl (2 of 2)]
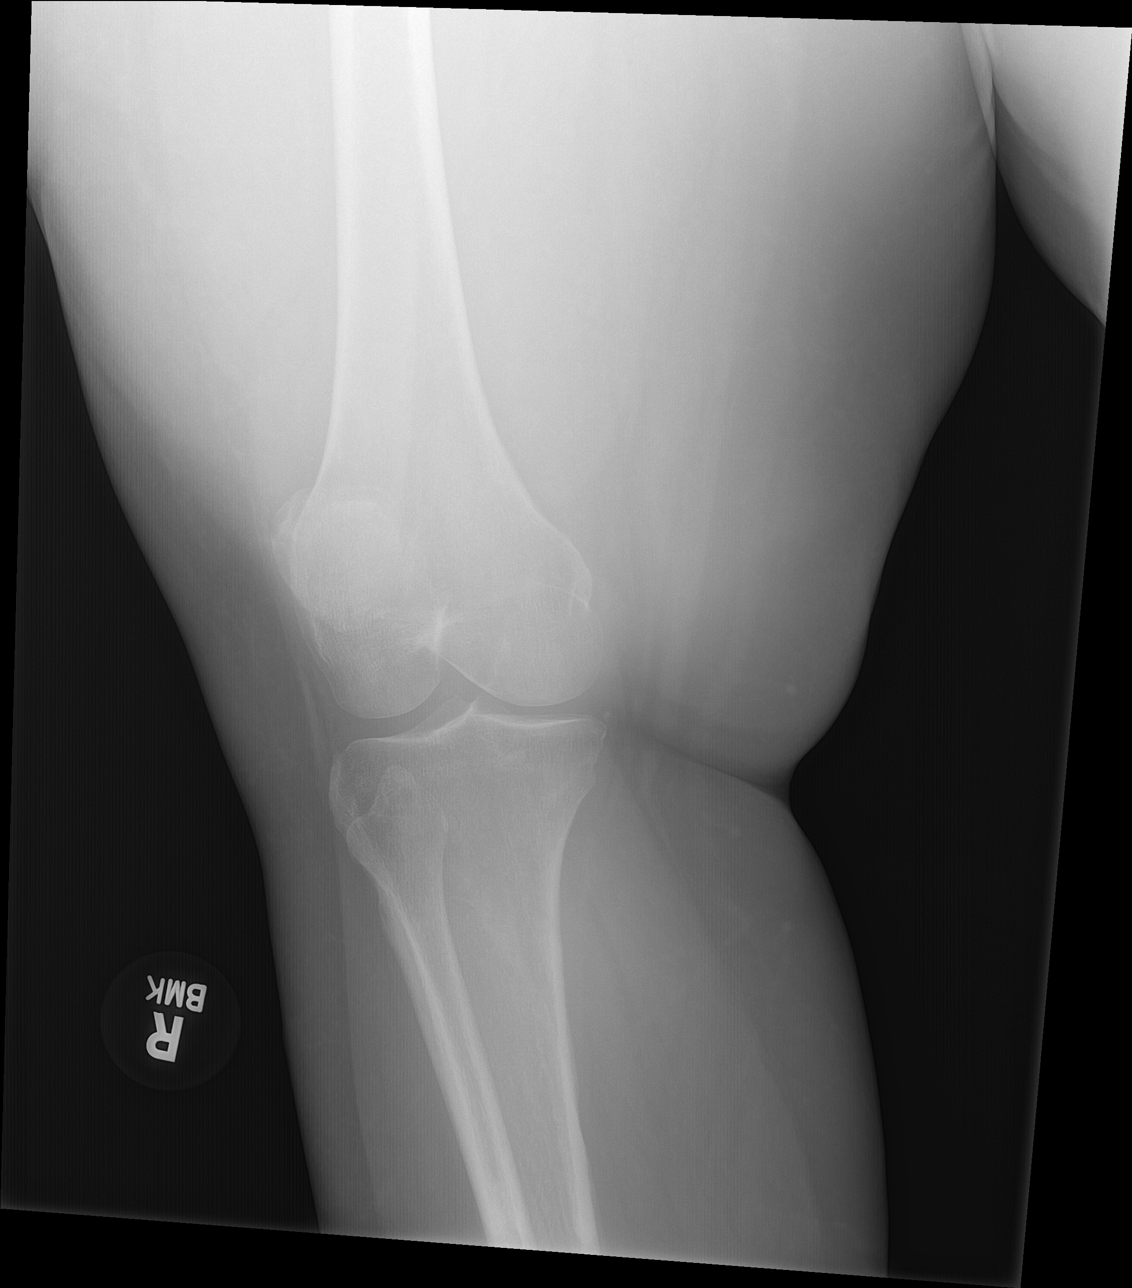

[knee lat]
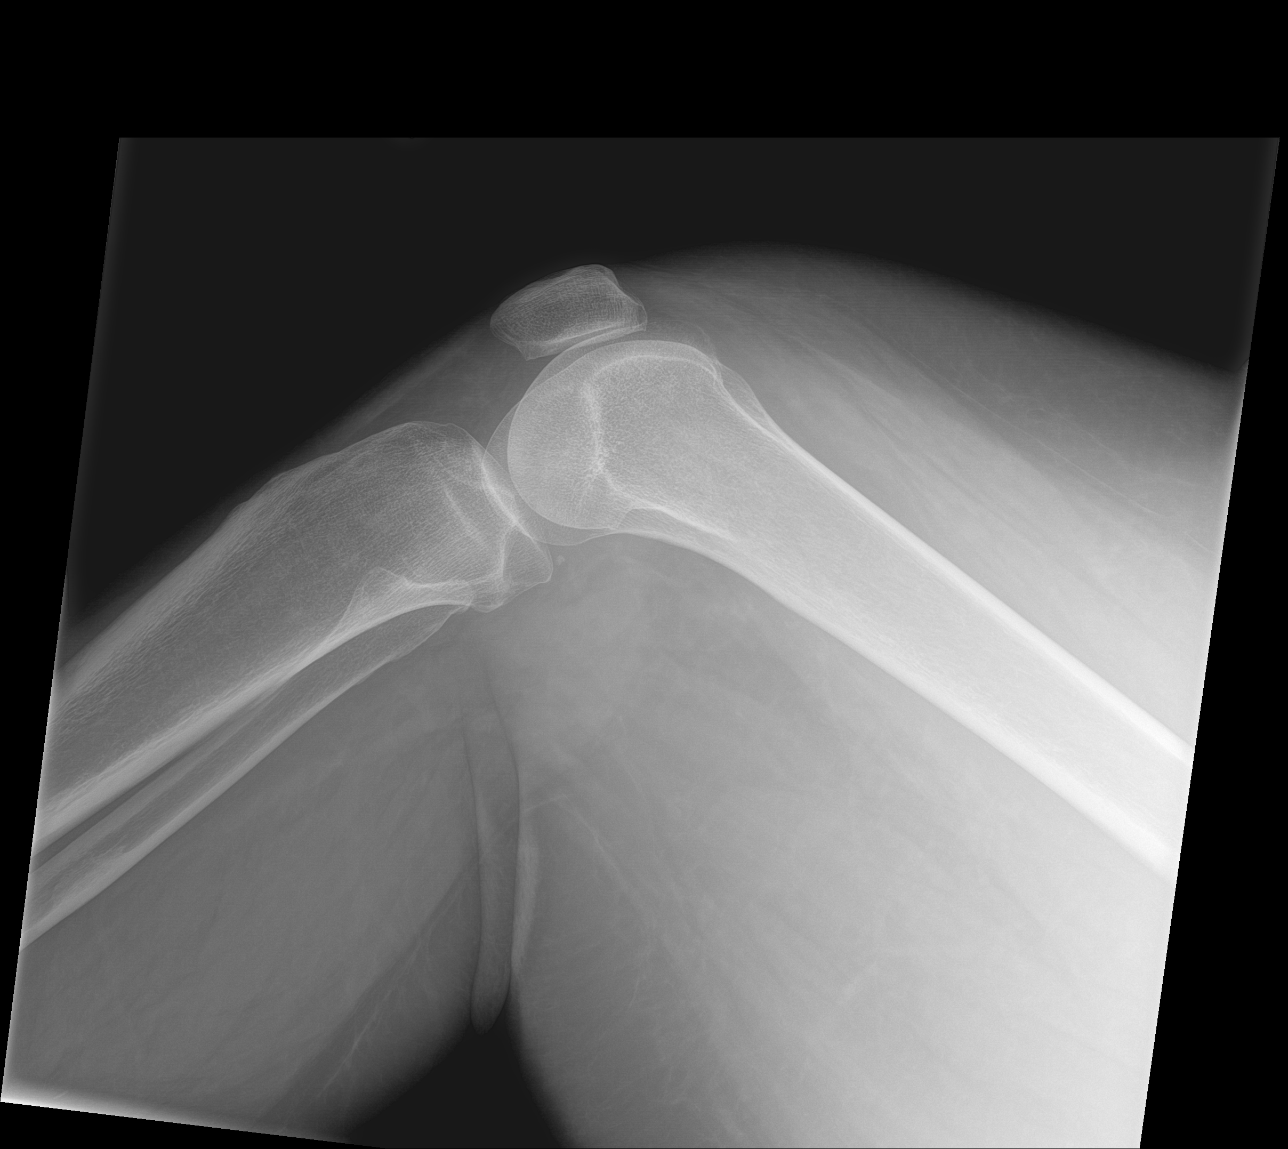

[4 of 4 positions shown; findings below may reference images not displayed]

FINDINGS: No evidence of fracture, dislocation, or joint effusion. No evidence
of arthropathy or other focal bone abnormality. Soft tissues are
unremarkable.
IMPRESSION: Negative.

## 2021-09-21 IMAGING — DX DG FEMUR 2+V*R*
4 series · 4 of 4 positions shown · non-contrast
Comparison: None.

CLINICAL DATA: Right knee injury a few days ago with pain.

EXAM:
RIGHT FEMUR 2 VIEWS

[femur ap proximal (1 of 2)]
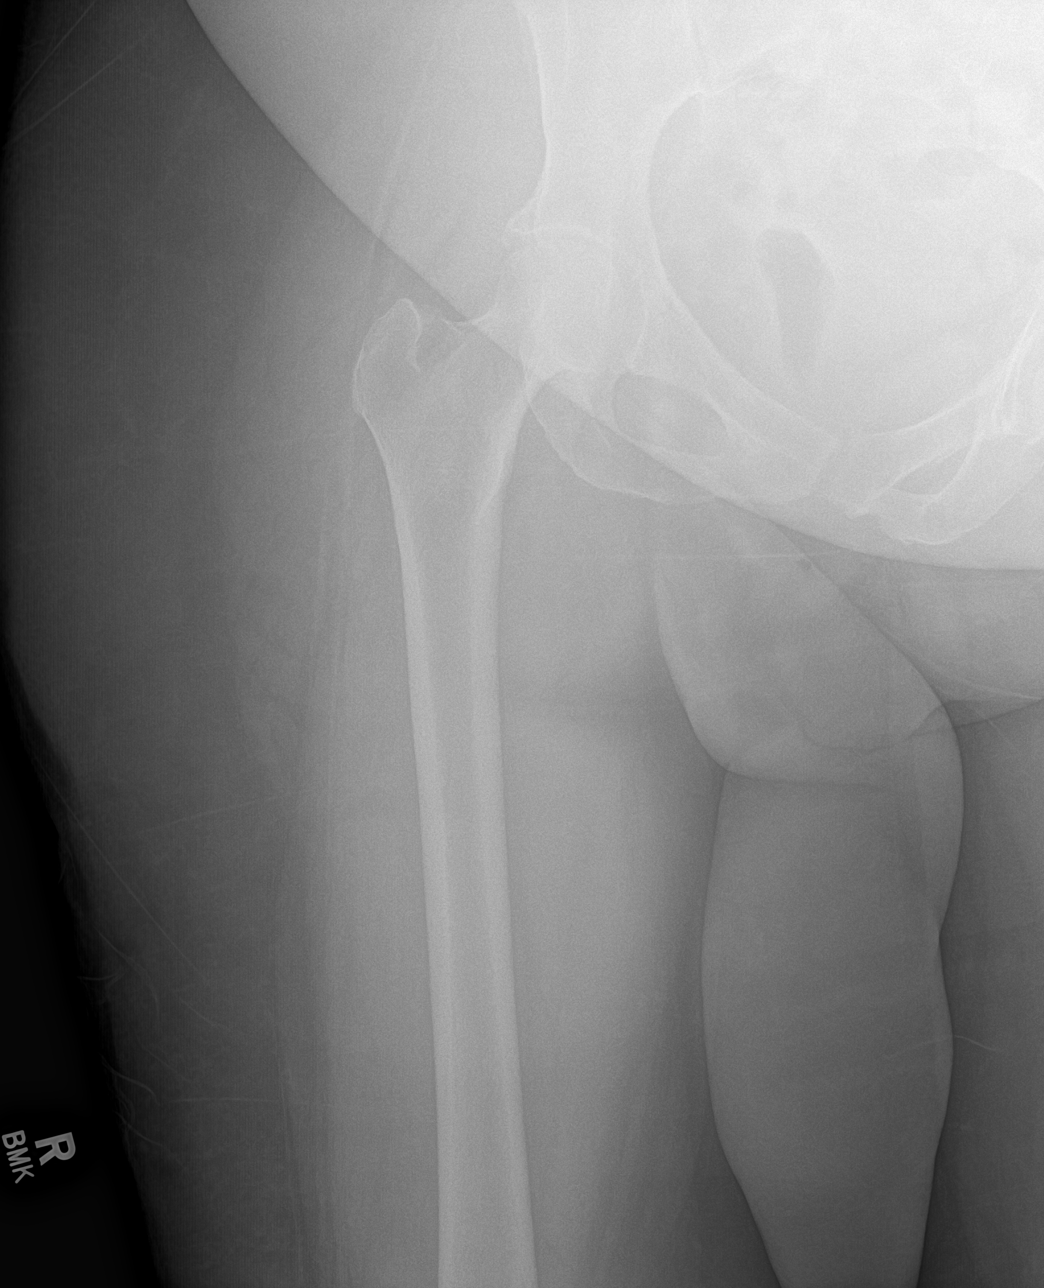

[femur ap proximal (2 of 2)]
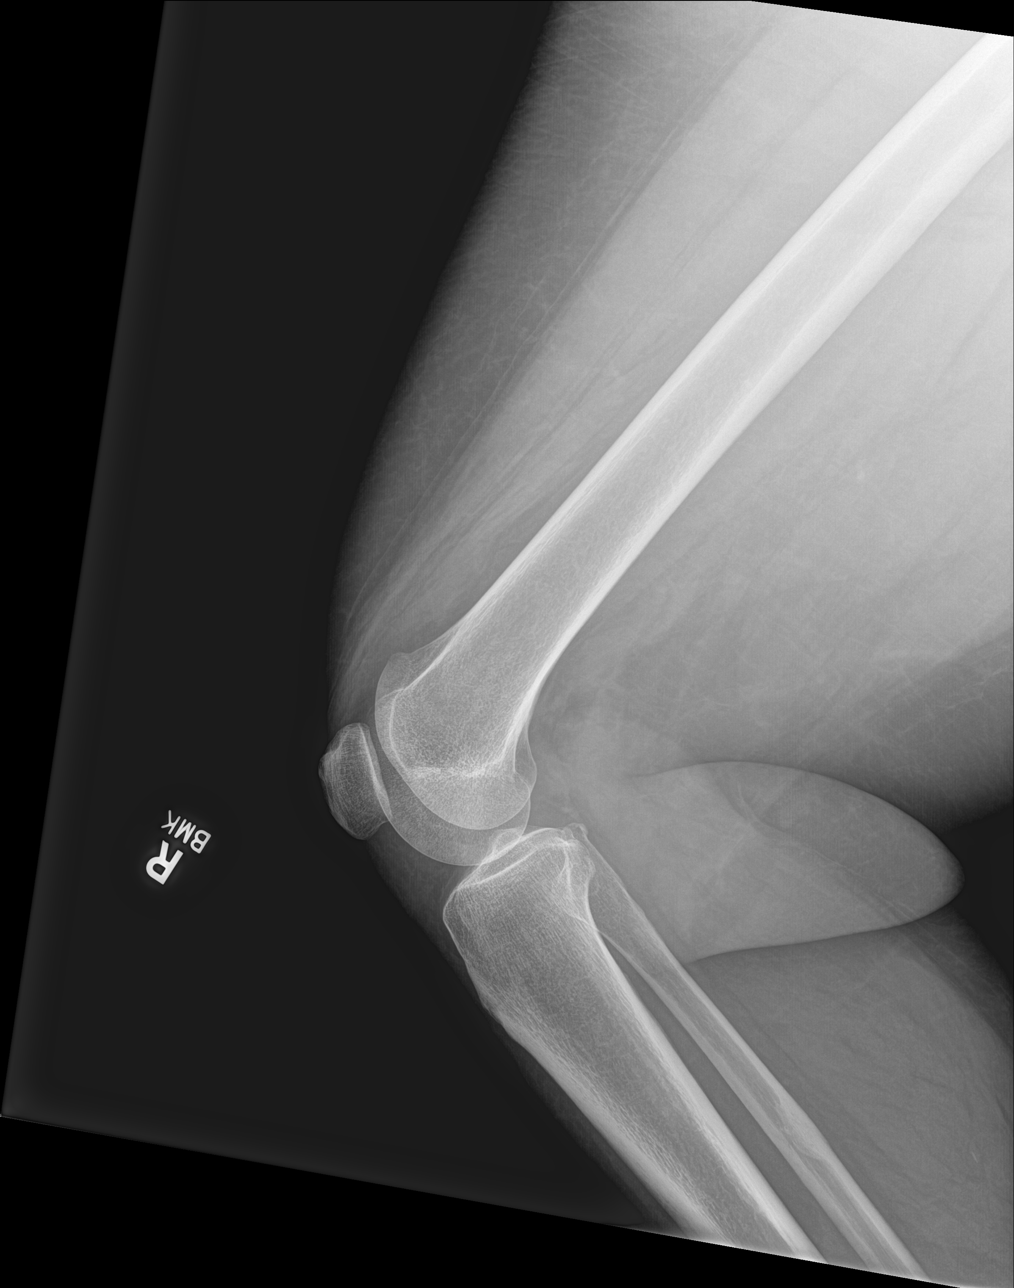

[femur lat distal (1 of 2)]
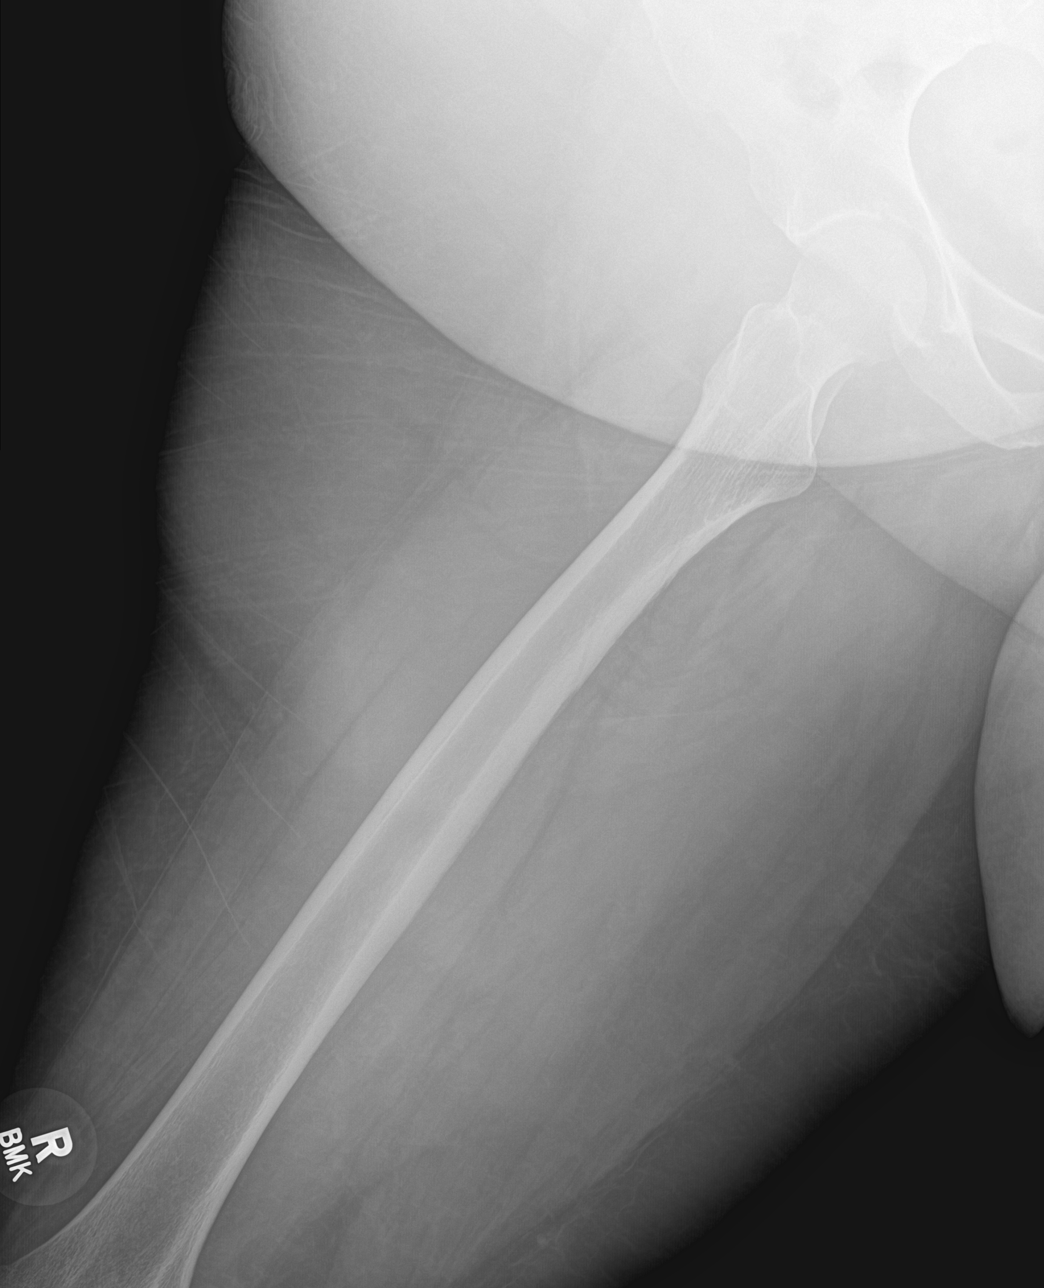

[femur lat distal (2 of 2)]
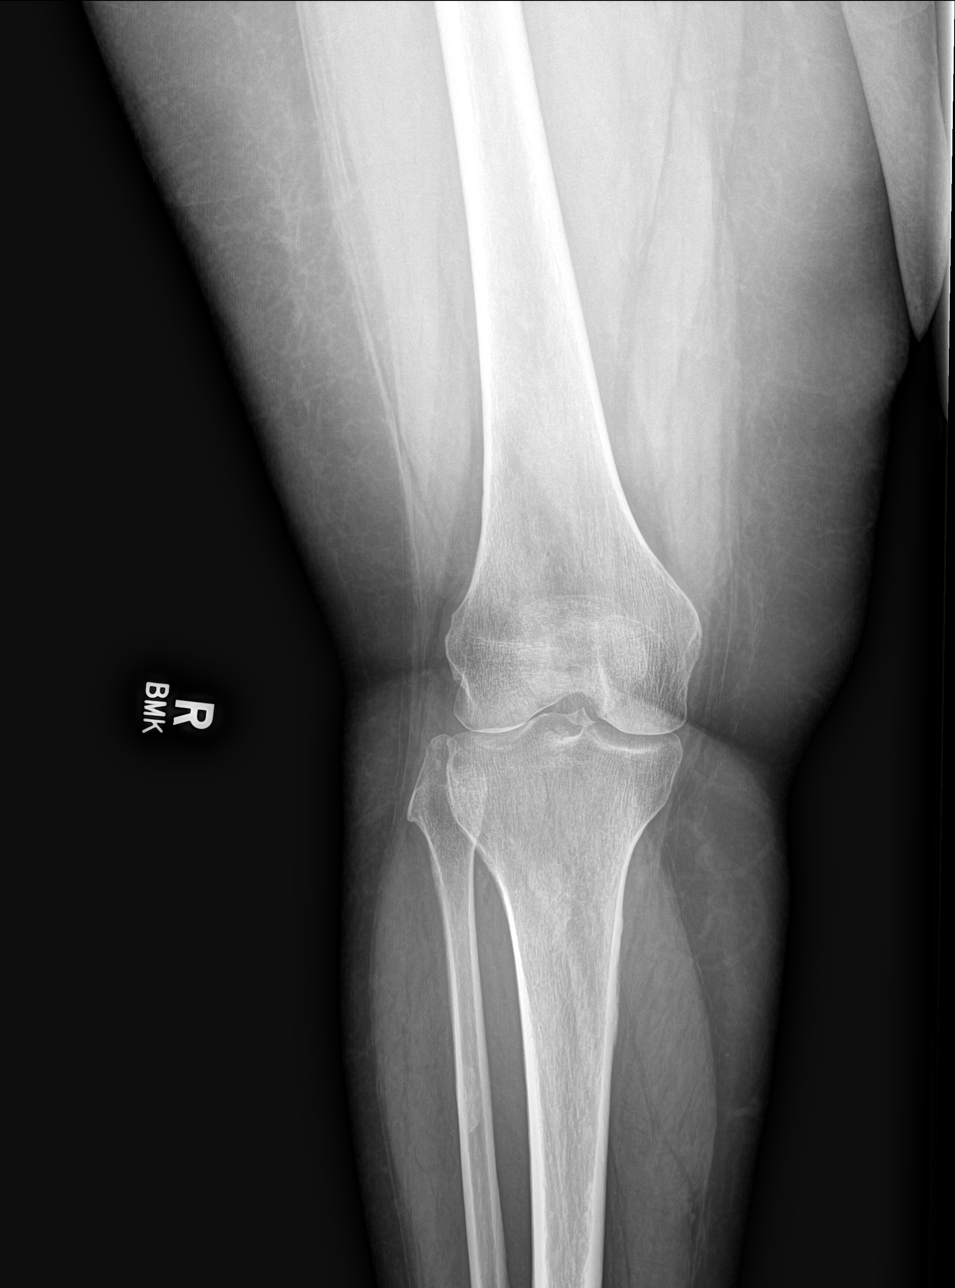

[4 of 4 positions shown; findings below may reference images not displayed]

FINDINGS: No evidence of fracture or subluxation. No notable degenerative
spurring. Benign sclerosis at the shaft of the fibula.
IMPRESSION: No acute finding or significant degenerative spurring.

## 2021-09-21 IMAGING — US US EXTREM LOW VENOUS*R*
1 series · 14 of 24 positions shown · non-contrast
Comparison: None.

CLINICAL DATA: Torn hamstring in the right leg for 3 days

EXAM:
RIGHT LOWER EXTREMITY VENOUS DOPPLER ULTRASOUND
TECHNIQUE: Gray-scale sonography with compression, as well as color and duplex
ultrasound, were performed to evaluate the deep venous system(s)
from the level of the common femoral vein through the popliteal and
proximal calf veins.

[Series 1: us venous img lower uni right (dvt) · portal-venous · 14 of 39 slices shown]
[im 1/39]
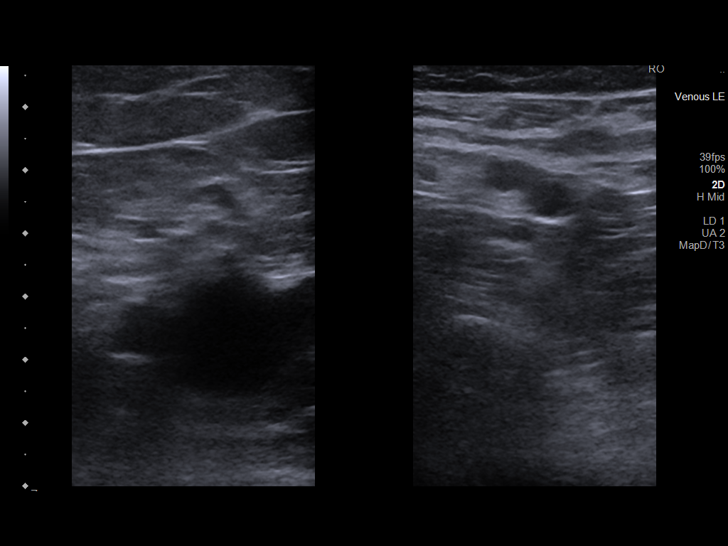
[im 4/39]
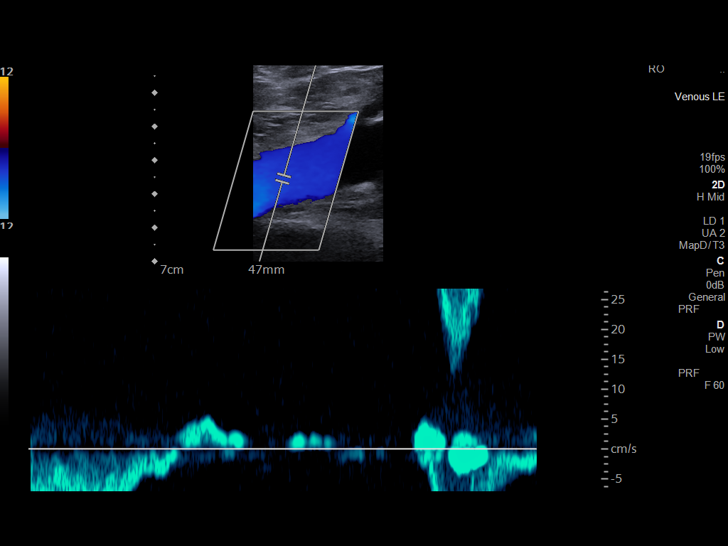
[im 7/39]
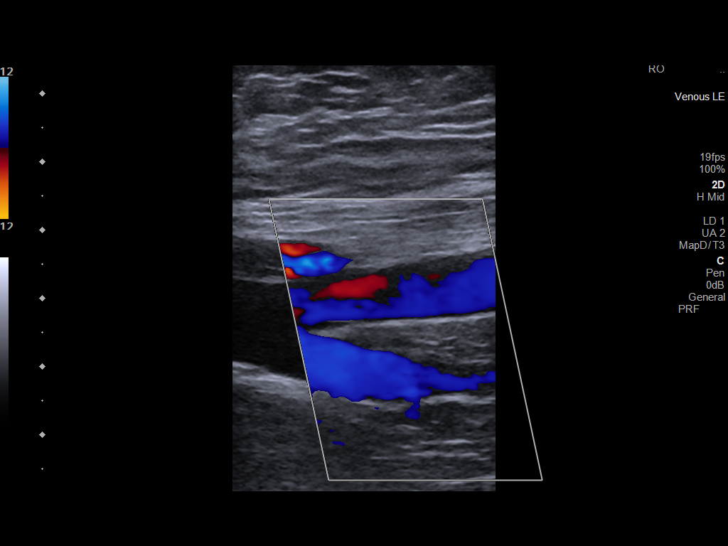
[im 10/39]
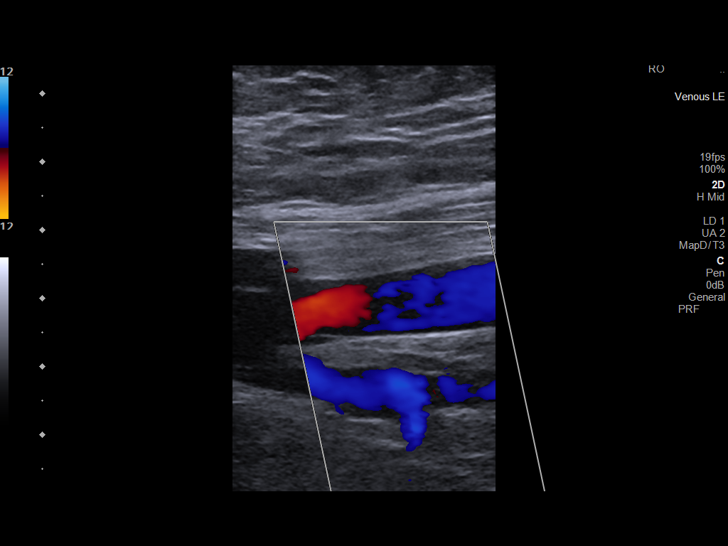
[im 12/39]
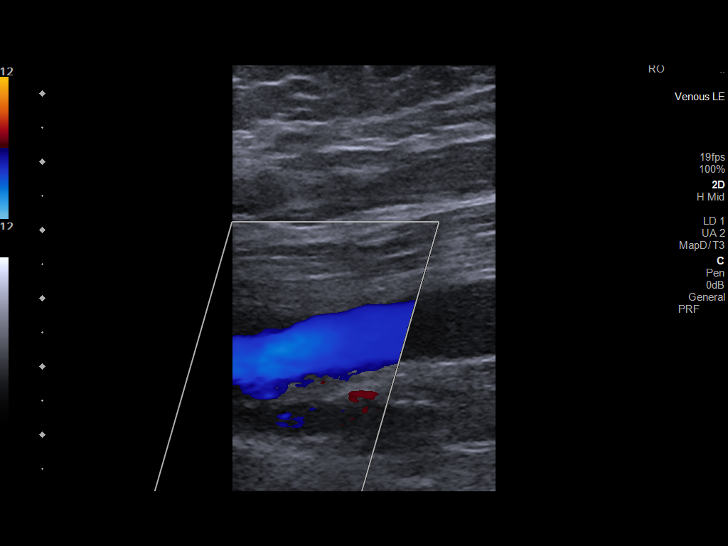
[im 15/39]
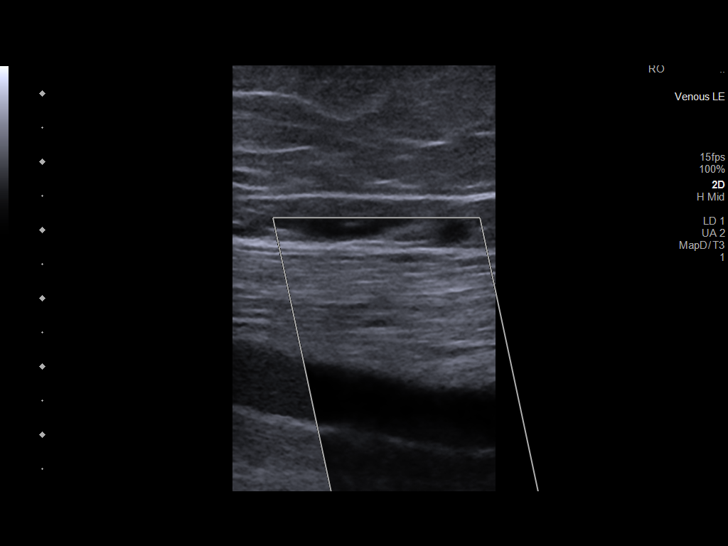
[im 19/39]
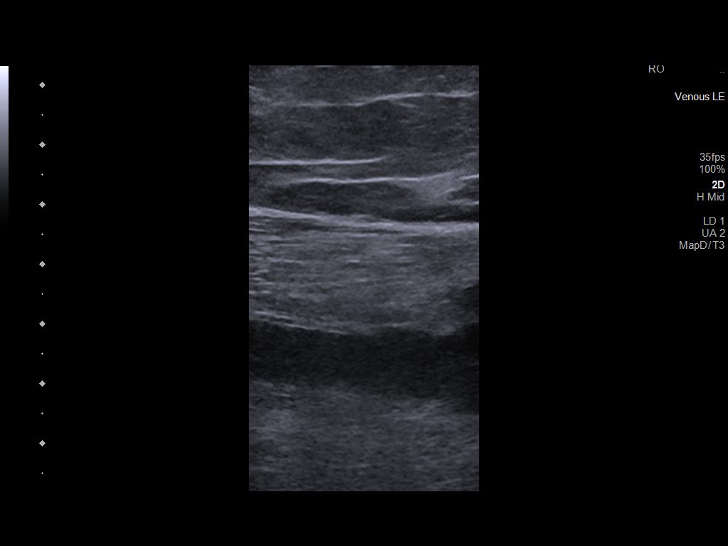
[im 20/39]
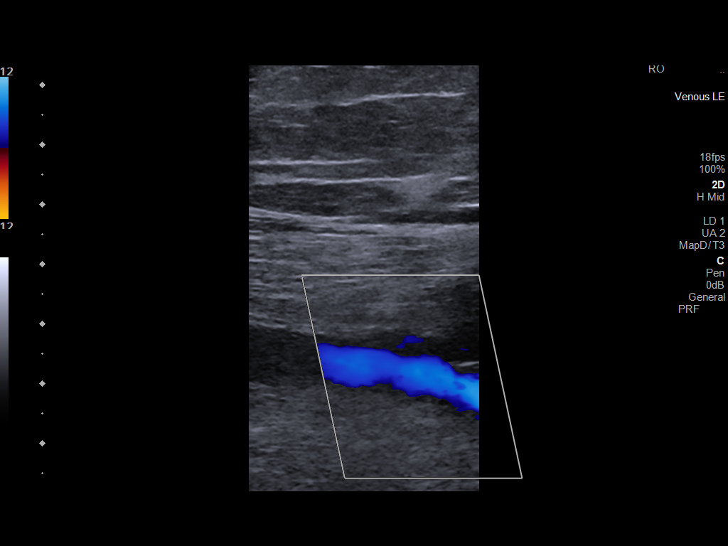
[im 24/39]
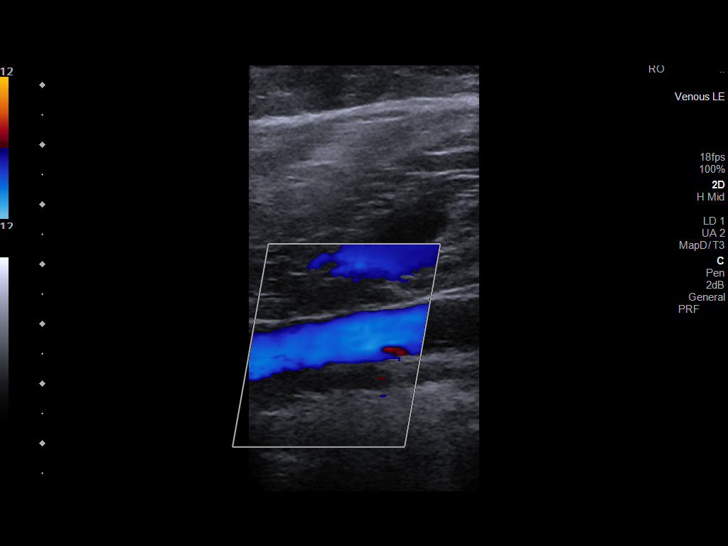
[im 27/39]
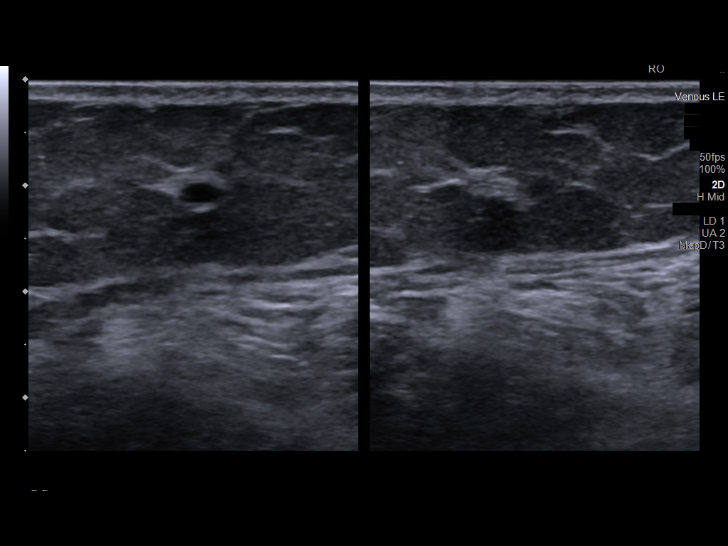
[im 30/39]
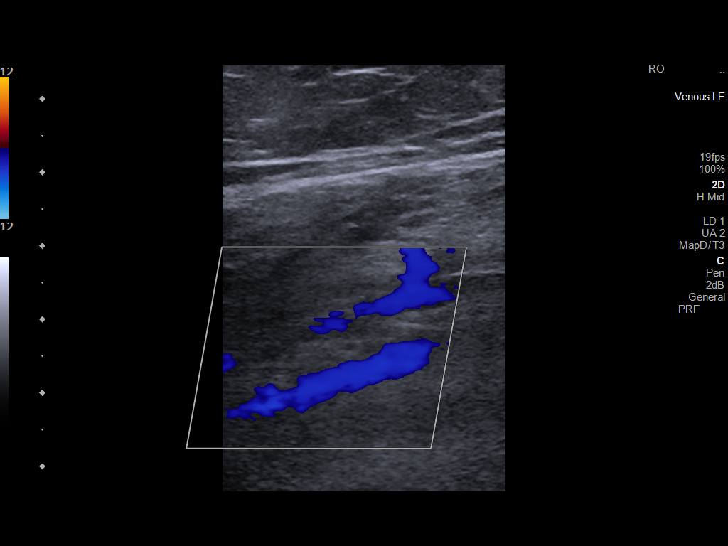
[im 32/39]
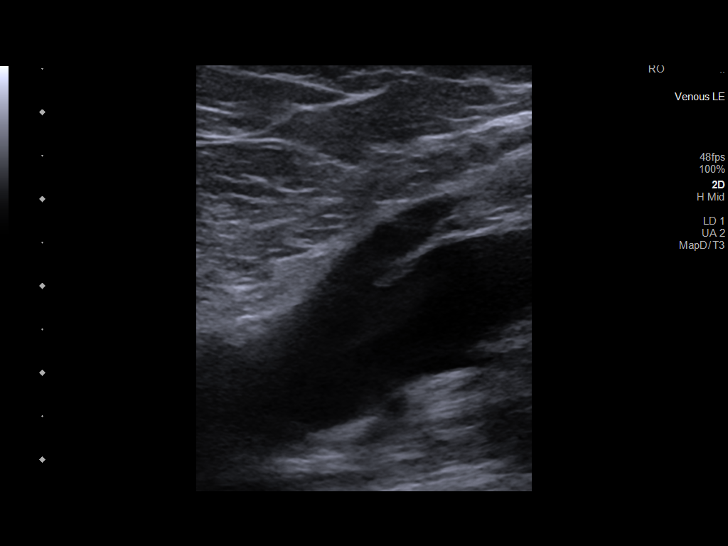
[im 35/39]
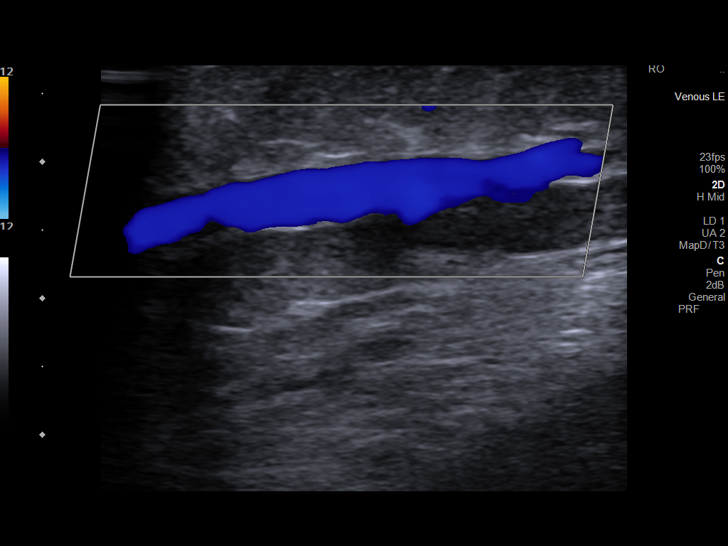
[im 39/39]
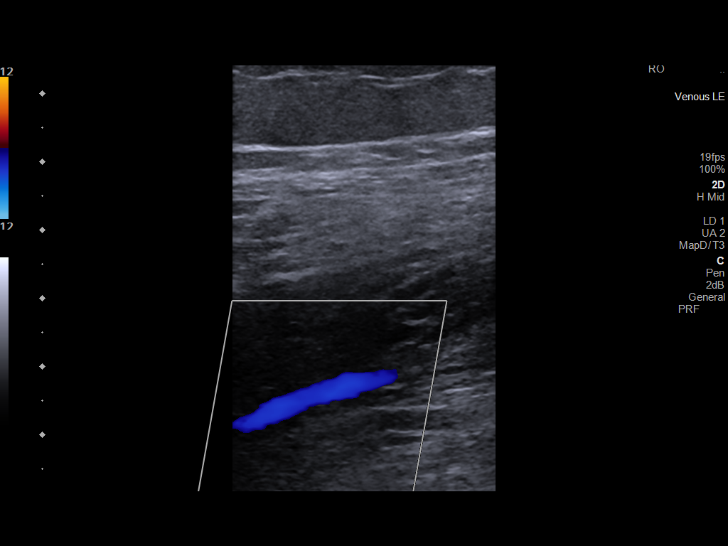

[14 of 24 positions shown; findings below may reference images not displayed]

FINDINGS: VENOUS

Normal compressibility of the common femoral, superficial femoral,
and popliteal veins, as well as the visualized calf veins.
Visualized portions of profunda femoral vein and great saphenous
vein unremarkable. No filling defects to suggest DVT on grayscale or
color Doppler imaging. Doppler waveforms show normal direction of
venous flow, normal respiratory plasticity and response to
augmentation.

Limited views of the contralateral common femoral vein are
unremarkable.
IMPRESSION: Negative for DVT in the right lower extremity

## 2021-09-21 MED ORDER — OXYCODONE-ACETAMINOPHEN 5-325 MG PO TABS
1.0000 | ORAL_TABLET | Freq: Once | ORAL | Status: AC
  Administered 2021-09-21: 1 via ORAL
  Filled 2021-09-21: qty 1

## 2021-09-21 MED ORDER — MELOXICAM 7.5 MG PO TABS: 7.5000 mg | ORAL_TABLET | Freq: Two times a day (BID) | ORAL | 0 refills | Status: AC | PRN

## 2021-09-21 MED ORDER — IBUPROFEN 800 MG PO TABS
800.0000 mg | ORAL_TABLET | Freq: Once | ORAL | Status: AC
  Administered 2021-09-21: 800 mg via ORAL
  Filled 2021-09-21: qty 1

## 2021-09-21 NOTE — ED Provider Notes (Signed)
At change of shift care signed out from Dr. Roxanne Mins, this patient has what appears to be musculoskeletal pain, it does not appear to be a DVT based on the ultrasound which was negative for deep venous thrombosis.  Will be prescribed an anti-inflammatory, vital signs are unremarkable, stable for discharge ?  ?Noemi Chapel, MD ?09/21/21 1108 ? ?

## 2021-09-21 NOTE — ED Provider Notes (Signed)
?[facility] ?Provider Note ? ? ?CSN: [id number] ?Arrival date & time: 09/21/21  0408 ? ?  ? ?History ? ?Chief Complaint  ?Patient presents with  ? Knee Pain  ? ? ?Elizabeth Bautista is a 46 y.o. female. ? ?The history is provided by the patient.  ?Knee Pain ?She has history of hypertension, diabetes, asthma, seizures, heart failure with preserved ejection fraction and comes in complaining of pain in her right leg which started about 4 days ago.  She states that she did feel something pull or pop in the posterior aspect of her right thigh and knee.  Since then, pain has radiated up to the proximal right thigh and into the right calf.  Pain is almost all posterior.  She has tried applying IcyHot without any benefit.  She denies any actual trauma. ?  ?Home Medications ?Prior to Admission medications   ?Medication Sig Start Date End Date Taking? Authorizing Provider  ?acetaminophen (TYLENOL) 325 MG tablet Take 2 tablets (650 mg total) by mouth every 6 (six) hours as needed for headache. 08/06/21   Martyn Ehrich, NP  ?albuterol (PROAIR HFA) 108 (90 Base) MCG/ACT inhaler 2 puffs every 4 hours as needed only  if your can't catch your breath ?Patient taking differently: Inhale 1-2 puffs into the lungs every 4 (four) hours as needed for wheezing or shortness of breath (only if you can't catch your breath). 08/22/20   [name], MD  ?azelastine (ASTELIN) 0.1 % nasal spray Place 1 spray into both nostrils 2 (two) times daily. Use in each nostril as directed 02/22/21   Chesley Mires, MD  ?blood glucose meter kit and supplies Dispense based on patient and insurance preference. Use up to four times daily as directed. (FOR ICD-10 E10.9, E11.9). 02/01/20   Ailene Ards, NP  ?Blood Pressure Monitoring (SPHYGMOMANOMETER) MISC 1 each by Does not apply route daily. 01/18/20   Ailene Ards, NP  ?busPIRone (BUSPAR) 7.5 MG tablet Take 7.5 mg by mouth 2 (two) times daily. 09/05/19   [provider]  ?cetirizine  (ZYRTEC) 10 MG tablet Take 1 tablet (10 mg total) by mouth daily. 08/06/21   Martyn Ehrich, NP  ?Cholecalciferol 1.25 MG (50000 UT) TABS Take 1 tablet by mouth daily. 02/01/20   Ailene Ards, NP  ?DULoxetine (CYMBALTA) 60 MG capsule Take 60 mg by mouth daily. 04/17/21   [provider]  ?EPINEPHrine 0.3 mg/0.3 mL IJ SOAJ injection Inject 0.3 mg into the muscle as needed for anaphylaxis. 09/26/20   Chesley Mires, MD  ?escitalopram (LEXAPRO) 20 MG tablet Take 1 tablet by mouth every evening. 02/29/20   [provider]  ?famotidine (PEPCID) 20 MG tablet Take 1 tablet (20 mg total) by mouth daily. 08/29/21 09/28/21  Chesley Mires, MD  ?ferrous sulfate 325 (65 FE) MG tablet TAKE 1 TABLET BY MOUTH TWICE A DAY 11/29/20   [name], MD  ?fluticasone (FLONASE) 50 MCG/ACT nasal spray SPRAY 1 SPRAY INTO BOTH NOSTRILS DAILY. 11/14/20   Valentina Shaggy, MD  ?Fluticasone-Umeclidin-Vilant (TRELEGY ELLIPTA) 200-62.5-25 MCG/ACT AEPB Inhale 1 puff into the lungs daily. 08/06/21   Martyn Ehrich, NP  ?Fluticasone-Umeclidin-Vilant (TRELEGY ELLIPTA) 200-62.5-25 MCG/INH AEPB Inhale 1 puff into the lungs daily. 02/22/21   Chesley Mires, MD  ?hydrochlorothiazide (HYDRODIURIL) 25 MG tablet Take 1 tablet (25 mg total) by mouth daily. For high blood pressure. 05/16/21   [name], MD  ?hydroxychloroquine (PLAQUENIL) 200 MG tablet TAKE 1 TABLET BY  MOUTH TWICE A DAY 07/11/21   Rice, Resa Miner, MD  ?ipratropium-albuterol (DUONEB) 0.5-2.5 (3) MG/3ML SOLN Take 3 mLs by nebulization every 6 (six) hours as needed. 08/06/21   Martyn Ehrich, NP  ?levETIRAcetam (KEPPRA) 500 MG tablet Take 1 tablet (500 mg total) by mouth 2 (two) times daily. 03/13/20 09/04/21  Manuella Ghazi, Pratik D, DO  ?metFORMIN (GLUCOPHAGE XR) 500 MG 24 hr tablet Take 1 tablet (500 mg total) by mouth daily with breakfast. 05/16/21   [name], MD  ?metoCLOPramide (REGLAN) 10 MG tablet Take 10 mg by mouth every morning. 08/06/21   [provider]  ?montelukast (SINGULAIR) 10 MG tablet TAKE 1 TABLET BY MOUTH EVERYDAY AT BEDTIME 08/16/21   Valentina Shaggy, MD  ?norethindrone (AYGESTIN) 5 MG tablet Take 1 tablet (5 mg total) by mouth daily. 08/20/21 11/18/21  Janyth Pupa, DO  ?ondansetron (ZOFRAN-ODT) 8 MG disintegrating tablet Take 1 tablet (8 mg total) by mouth every 8 (eight) hours as needed for nausea or vomiting. 08/21/21   Pieter Partridge, DO  ?pantoprazole (PROTONIX) 40 MG tablet TAKE 1 TABLET (40 MG TOTAL) BY MOUTH DAILY. TAKE 30-60 MIN BEFORE FIRST MEAL OF THE DAY 08/29/21   Chesley Mires, MD  ?pregabalin (LYRICA) 200 MG capsule Take 1 capsule (200 mg total) by mouth in the morning, at noon, and at bedtime. 09/17/21   Kirsteins, Luanna Salk, MD  ?QUEtiapine (SEROQUEL) 100 MG tablet Take 50-100 mg by mouth at bedtime. 06/24/21   [provider]  ?revefenacin (YUPELRI) 175 MCG/3ML nebulizer solution Take 3 mLs (175 mcg total) by nebulization daily. 07/26/21   Phillips Grout, MD  ?rizatriptan (MAXALT-MLT) 10 MG disintegrating tablet Take 1 tablet earliest onset of migraine.  May repeat in 2 hours if needed. Maximum 2 tablets in 24 hours 08/21/21   Metta Clines R, DO  ?triamcinolone (KENALOG) 0.025 % ointment APPLY TO AFFECTED AREA TWICE A DAY 12/26/20   [name], MD  ?triamcinolone ointment (KENALOG) 0.5 % Apply 1 application topically as needed. 08/22/20   [name], MD  ?zonisamide (ZONEGRAN) 25 MG capsule Take 1 capsule daily for 7 days, then 2 capsules daily for 7 days, then 3 capsules daily for 7 days, then 4 capsules daily. 08/21/21   Pieter Partridge, DO  ?   ? ?Allergies    ?Ferumoxytol, Phenytoin, Pecan nut (diagnostic), and Phenylbutazones   ? ?Review of Systems   ?Review of Systems  ?All other systems reviewed and are negative. ? ?Physical Exam ?Updated Vital Signs ?BP 128/85   Pulse 83   Temp 98.2 ?F (36.8 ?C) (Oral)   Resp 18   Ht 5' 4"  (1.626 m)   Wt 126 kg   LMP 09/05/2021   SpO2 99%   BMI 47.68 kg/m?  ?Physical  Exam ?Vitals and nursing note reviewed.  ?Morbidly obese 46 year old female, crying in pain, but in no acute distress. Vital signs are normal. Oxygen saturation is 99%, which is normal. ?Head is normocephalic and atraumatic. PERRLA, EOMI. Oropharynx is clear. ?Neck is nontender and supple without adenopathy or JVD. ?Back is nontender and there is no CVA tenderness. ?Lungs are clear without rales, wheezes, or rhonchi. ?Chest is nontender. ?Heart has regular rate and rhythm without murmur. ?Abdomen is soft, flat, nontender without masses or hepatosplenomegaly and peristalsis is normoactive. ?Extremities: There is no swelling or deformity noted of the right leg, no effusion detected in the right knee.  However, there is tenderness to palpation  rather diffusely from the right calf all the way up through the proximal right thigh.  Tenderness is mainly posterior and maximum tenderness seems to be over the hamstring muscles.  There is pain on passive range of motion of the right hip and knee, normal range of motion of the left hip and knee without pain. ?Skin is warm and dry without rash. ?Neurologic: Mental status is normal, cranial nerves are intact, there are no motor or sensory deficits. ? ?ED Results / Procedures / Treatments   ? ?Radiology ?DG Tibia/Fibula Right ? ?Result Date: 09/21/2021 ?CLINICAL DATA:  Knee pain related to injury a few days ago. EXAM: RIGHT TIBIA AND FIBULA - 2 VIEW COMPARISON:  None. FINDINGS: No fracture or subluxation. No significant degenerative spurring or acute soft tissue finding. Benign medullary sclerosis at the fibular shaft. IMPRESSION: No acute finding or significant degenerative spurring. Electronically Signed   By: Jorje Guild M.D.   On: 09/21/2021 05:18  ? ?DG Knee Complete 4 Views Right ? ?Result Date: 09/21/2021 ?CLINICAL DATA:  Right knee injury a few days ago when kneeling. Pain. EXAM: RIGHT KNEE - COMPLETE 4+ VIEW COMPARISON:  None. FINDINGS: No evidence of fracture,  dislocation, or joint effusion. No evidence of arthropathy or other focal bone abnormality. Soft tissues are unremarkable. IMPRESSION: Negative. Electronically Signed   By: Jorje Guild M.D.   On: 09/21/2021 05:16  ? ?DG [facility]

## 2021-09-21 NOTE — ED Notes (Addendum)
Right knee immobilizer applied, pt tolerated well. ?

## 2021-09-21 NOTE — ED Triage Notes (Signed)
Pt with c/o R knee pain after she felt a pop/pull a couple of days ago while kneeling beside bed. ?

## 2021-09-21 NOTE — ED Notes (Signed)
US being performed at bedside

## 2021-09-21 NOTE — Discharge Instructions (Signed)
The ultrasound shows that there is no signs of blood clot, I would like for you to take the following anti-inflammatory: ? ?Please take Mobic - 7.5mg  by mouth twice daily as needed for pain - this in an antiinflammatory medicine (NSAID) and is similar to ibuprofen - many people feel that it is stronger than ibuprofen and it is easier to take since it is a smaller pill.  Please use this only for 1 week - if your pain persists, you will need to follow up with your doctor in the office for ongoing guidance and pain control.   ? ? ?Thank you for letting us take care of you today! ? ?Please obtain all of your results from medical records or have your doctors office obtain the results - share them with your doctor - you should be seen at your doctors office in the next 2 days. Call today to arrange your follow up. Take the medications as prescribed. Please review all of the medicines and only take them if you do not have an allergy to them. Please be aware that if you are taking birth control pills, taking other prescriptions, ESPECIALLY ANTIBIOTICS may make the birth control ineffective - if this is the case, either do not engage in sexual activity or use alternative methods of birth control such as condoms until you have finished the medicine and your family doctor says it is OK to restart them. If you are on a blood thinner such as COUMADIN, be aware that any other medicine that you take may cause the coumadin to either work too much, or not enough - you should have your coumadin level rechecked in next 7 days if this is the case.  ??  ?It is also a possibility that you have an allergic reaction to any of the medicines that you have been prescribed - Everybody reacts differently to medications and while MOST people have no trouble with most medicines, you may have a reaction such as nausea, vomiting, rash, swelling, shortness of breath. If this is the case, please stop taking the medicine immediately and contact your  physician.  ? ?If you were given a medication in the ED such as percocet, vicodin, or morphine, be aware that these medicines are sedating and may change your ability to take care of yourself adequately for several hours after being given this medicines - you should not drive or take care of small children if you were given this medicine in the Emergency Department or if you have been prescribed these types of medicines. ??  ? You should return to the ER IMMEDIATELY if you develop severe or worsening symptoms.  ? ?

## 2021-09-23 ENCOUNTER — Ambulatory Visit: Payer: Medicaid Other | Admitting: Pulmonary Disease

## 2021-09-24 ENCOUNTER — Ambulatory Visit
Admission: RE | Admit: 2021-09-24 | Discharge: 2021-09-24 | Disposition: A | Discharge status: Home / Self Care | Payer: Medicaid Other | Source: Ambulatory Visit | Attending: Neurology | Admitting: Neurology

## 2021-09-24 ENCOUNTER — Other Ambulatory Visit: Payer: Self-pay | Admitting: Neurology

## 2021-09-24 DIAGNOSIS — R200 Anesthesia of skin: Secondary | ICD-10-CM

## 2021-09-24 DIAGNOSIS — R413 Other amnesia: Secondary | ICD-10-CM

## 2021-09-24 IMAGING — MR MR HEAD WO/W CM
13 series · 48 of 48 positions shown · IV contrast (multihance)
Comparison: None.

CLINICAL DATA: Memory loss, left-sided numbness

EXAM:
MRI HEAD WITHOUT AND WITH CONTRAST
TECHNIQUE: Multiplanar, multiecho pulse sequences of the brain and surrounding
structures were obtained without and with intravenous contrast.
CONTRAST:  20mL MULTIHANCE GADOBENATE DIMEGLUMINE 529 MG/ML IV SOLN

[Series 5: T1 · sagittal · 4.0mm · 0.75mm/px · 2 of 28 slices shown (1 of 3)]
[im 1/28]
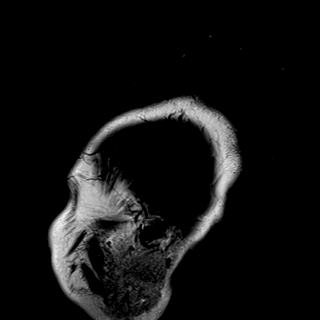
[im 28/28]
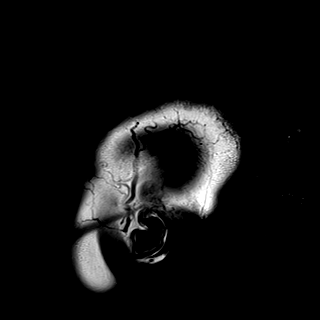

[Series 6: DWI · axial · 3.0mm · 0.94mm/px · z∈[-80,+74]mm · 9 of 176 slices shown (1 of 3)]
[im 1/176]
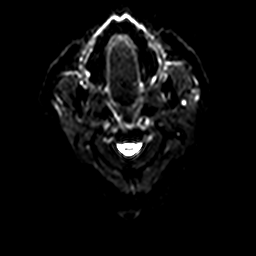
[im 22/176]
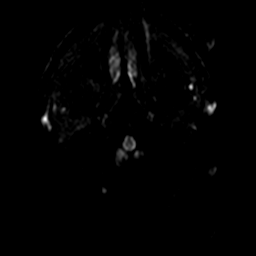
[im 44/176]
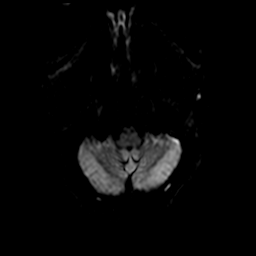
[im 66/176]
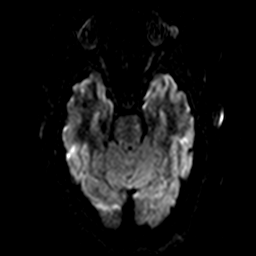
[im 88/176]
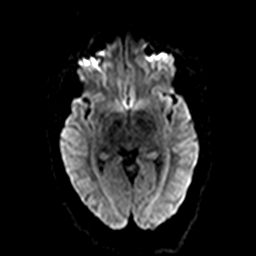
[im 110/176]
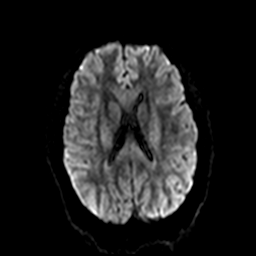
[im 132/176]
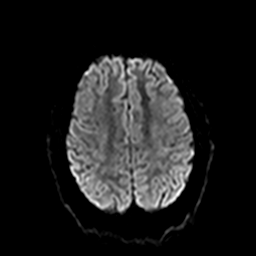
[im 154/176]
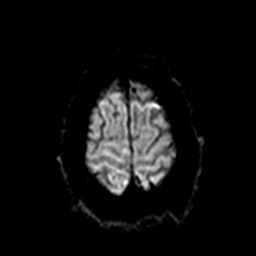
[im 176/176]
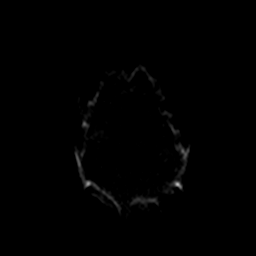

[Series 7: ax dwi_tracew · axial · 3.0mm · 0.94mm/px · z∈[-80,+74]mm · 4 of 88 slices shown]
[im 1/88]
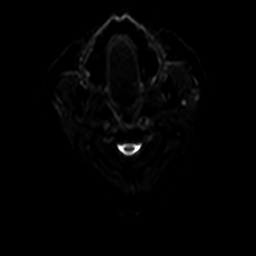
[im 30/88]
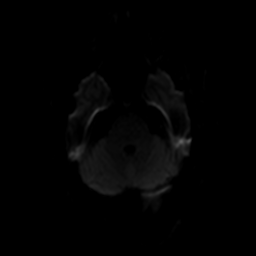
[im 59/88]
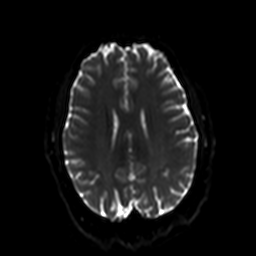
[im 88/88]
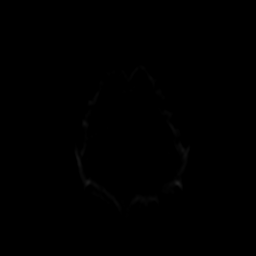

[Series 8: ax dwi_adc · axial · 3.0mm · 0.94mm/px · z∈[-80,+74]mm · 2 of 44 slices shown]
[im 1/44]
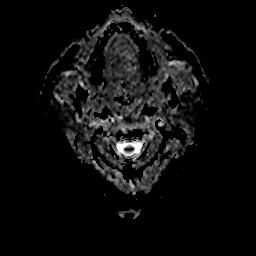
[im 44/44]
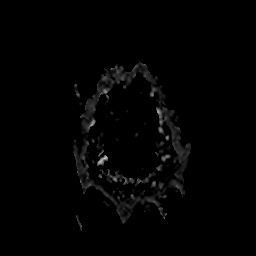

[Series 9: DWI · coronal · 5.0mm · 1.44mm/px · 3 of 66 slices shown (2 of 3)]
[im 1/66]
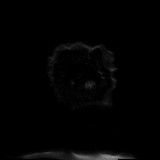
[im 33/66]
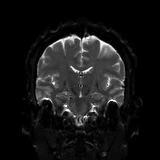
[im 66/66]
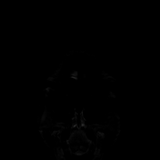

[Series 10: DWI · coronal · 5.0mm · 1.44mm/px · 2 of 33 slices shown (3 of 3)]
[im 1/33]
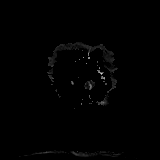
[im 33/33]
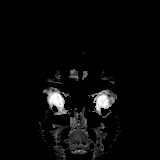

[Series 11: T2 · axial · 4.0mm · 0.36mm/px · z∈[-78,+72]mm · 2 of 30 slices shown]
[im 1/30]
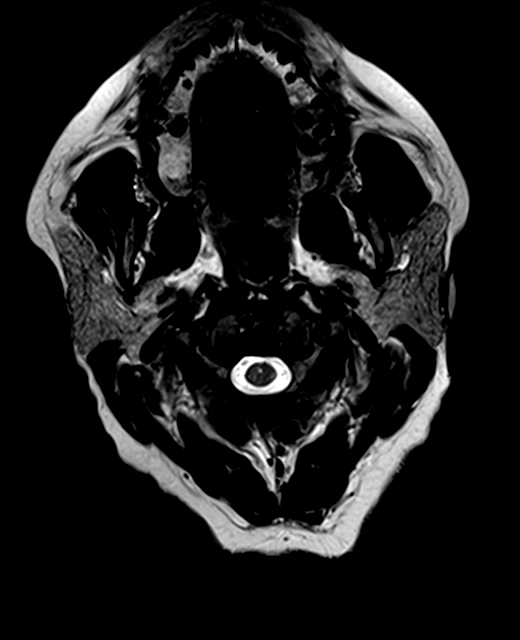
[im 30/30]
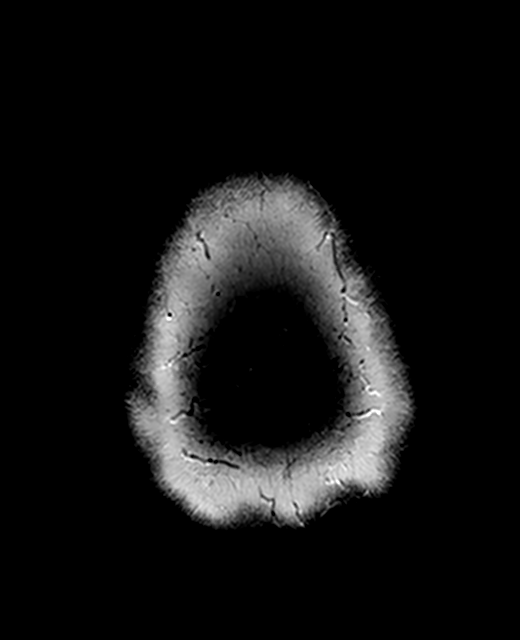

[Series 12: FLAIR · axial · 3.0mm · 0.72mm/px · 1 of 26 slices shown]
[im 1/26]
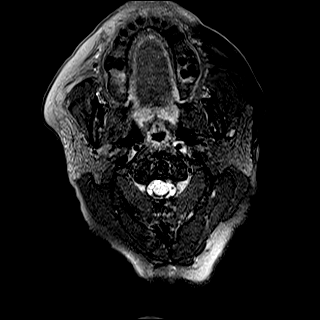

[Series 14: swi_images · axial · 1.5mm · 0.90mm/px · z∈[-74,+68]mm · 5 of 96 slices shown]
[im 1/96]
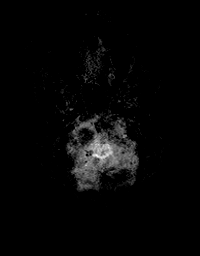
[im 24/96]
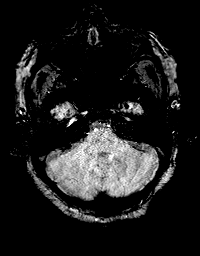
[im 48/96]
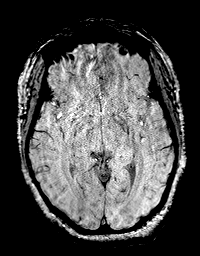
[im 72/96]
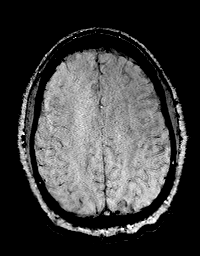
[im 96/96]
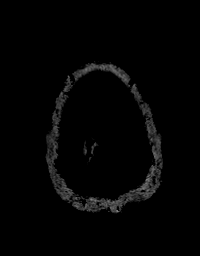

[Series 15: T1 · axial · 1.0mm · 0.94mm/px · z∈[-73,+68]mm · 7 of 144 slices shown (2 of 3)]
[im 1/144]
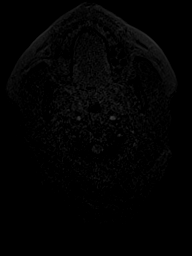
[im 24/144]
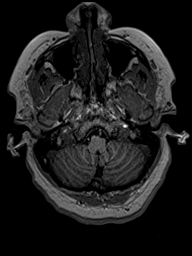
[im 48/144]
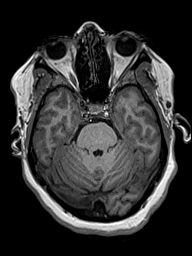
[im 72/144]
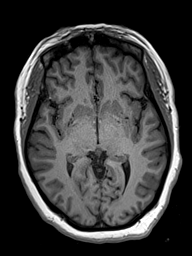
[im 96/144]
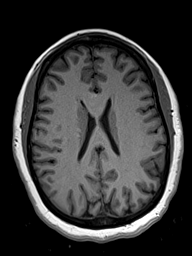
[im 120/144]
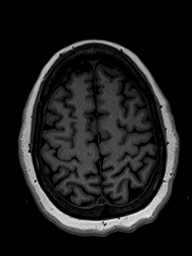
[im 144/144]
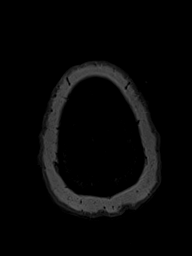

[Series 17: T2 post-contrast · coronal · 4.0mm · 0.36mm/px · 2 of 35 slices shown]
[im 1/35]
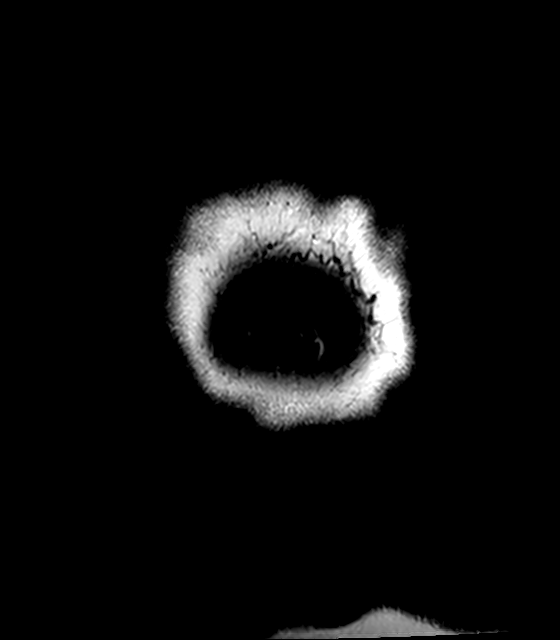
[im 35/35]
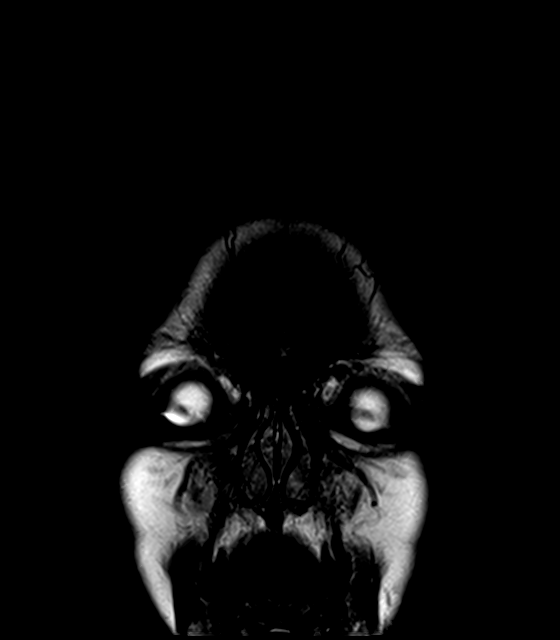

[Series 18: T1 · axial · 1.0mm · 0.94mm/px · z∈[-73,+68]mm · 7 of 144 slices shown (3 of 3)]
[im 1/144]
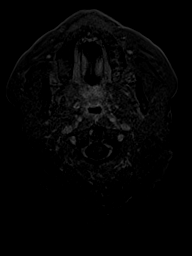
[im 24/144]
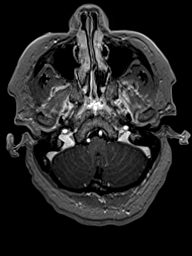
[im 48/144]
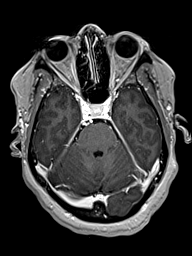
[im 72/144]
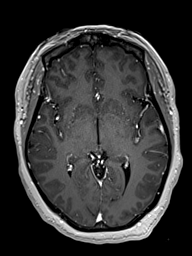
[im 96/144]
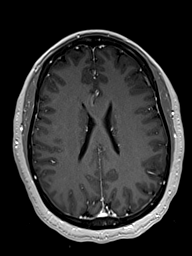
[im 120/144]
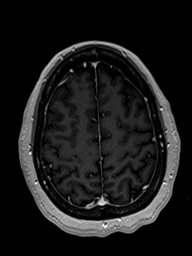
[im 144/144]
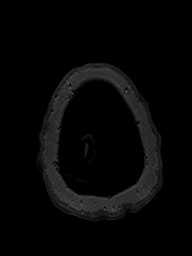

[Series 19: T1 post-contrast · coronal · 4.0mm · 0.72mm/px · 2 of 35 slices shown]
[im 1/35]
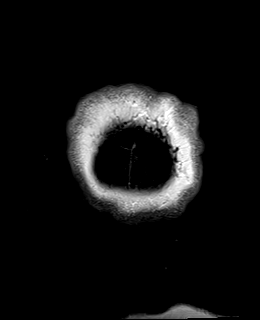
[im 35/35]
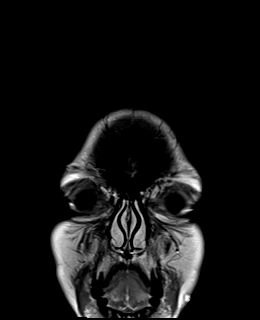

[48 of 48 positions shown; findings below may reference images not displayed]

FINDINGS: Brain: There is no acute infarction or intracranial hemorrhage.
There is no intracranial mass, mass effect, or edema. There is no
hydrocephalus or extra-axial fluid collection. Ventricles and sulci
are normal in size and configuration. No abnormal enhancement.

Vascular: Major vessel flow voids at the skull base are preserved.

Skull and upper cervical spine: Normal marrow signal is preserved.

Sinuses/Orbits: Trace mucosal thickening.  Orbits are unremarkable.

Other: Sella is unremarkable.  Minimal mastoid fluid opacification.
IMPRESSION: No acute or significant intracranial abnormality.

## 2021-09-24 MED ORDER — GADOBENATE DIMEGLUMINE 529 MG/ML IV SOLN
20.0000 mL | Freq: Once | INTRAVENOUS | Status: AC | PRN
  Administered 2021-09-24: 20 mL via INTRAVENOUS

## 2021-09-24 MED ORDER — ZONISAMIDE 100 MG PO CAPS: 100.0000 mg | ORAL_CAPSULE | Freq: Every day | ORAL | 5 refills | Status: DC

## 2021-09-25 ENCOUNTER — Other Ambulatory Visit: Payer: Self-pay | Admitting: Pulmonary Disease

## 2021-09-26 ENCOUNTER — Other Ambulatory Visit: Payer: Self-pay | Admitting: Internal Medicine

## 2021-09-26 ENCOUNTER — Telehealth: Payer: Self-pay | Admitting: *Deleted

## 2021-09-26 NOTE — Telephone Encounter (Signed)
Spoke to patient and confirmed delivery of Nucala to patient.  She has received same and stored in fridge. Advised patient to contact clinic for initial dose and admin instructions ?

## 2021-09-30 NOTE — Progress Notes (Signed)
Patient advised of her MRI results.

## 2021-10-04 ENCOUNTER — Ambulatory Visit: Payer: Medicaid Other | Admitting: Allergy & Immunology

## 2021-10-09 ENCOUNTER — Ambulatory Visit: Payer: Medicaid Other | Admitting: Orthopedic Surgery

## 2021-10-21 ENCOUNTER — Ambulatory Visit: Payer: Medicaid Other | Admitting: Obstetrics & Gynecology

## 2021-10-21 DIAGNOSIS — Z9884 Bariatric surgery status: Secondary | ICD-10-CM | POA: Insufficient documentation

## 2021-10-23 ENCOUNTER — Ambulatory Visit: Payer: Medicaid Other | Admitting: Family Medicine

## 2021-10-25 ENCOUNTER — Ambulatory Visit (INDEPENDENT_AMBULATORY_CARE_PROVIDER_SITE_OTHER): Payer: Medicaid Other | Admitting: Cardiology

## 2021-10-25 ENCOUNTER — Encounter (HOSPITAL_BASED_OUTPATIENT_CLINIC_OR_DEPARTMENT_OTHER): Payer: Self-pay | Admitting: Cardiology

## 2021-10-25 VITALS — BP 114/82 | HR 86 | Ht 64.0 in | Wt 263.3 lb

## 2021-10-25 DIAGNOSIS — Z7189 Other specified counseling: Secondary | ICD-10-CM

## 2021-10-25 DIAGNOSIS — Z9989 Dependence on other enabling machines and devices: Secondary | ICD-10-CM

## 2021-10-25 DIAGNOSIS — I10 Essential (primary) hypertension: Secondary | ICD-10-CM

## 2021-10-25 DIAGNOSIS — G4733 Obstructive sleep apnea (adult) (pediatric): Secondary | ICD-10-CM | POA: Diagnosis not present

## 2021-10-25 DIAGNOSIS — Z6841 Body Mass Index (BMI) 40.0 and over, adult: Secondary | ICD-10-CM

## 2021-10-25 NOTE — Progress Notes (Signed)
Cardiology Office Note:    Date:  10/25/2021   ID:  [name], DOB Jan 23, 1976, MRN [id number]  PCP:  Elizabeth Spar, MD  Cardiologist:  Elizabeth Dresser, MD  Referring MD: Elizabeth Spar, MD   CC: follow up  History of Present Illness:    Elizabeth Bautista is a 46 y.o. female with a hx of bariatric surgery 2023, hypertension, OSA on CPAP, type II diabetes, rheumatoid arthritis, obesity who is seen for follow-up. She was initially seen 04/16/2020 as a new consult at the request of Elizabeth Spar, MD for the evaluation and management of chest pain.  Today: She underwent laparoscopic sleeve gastric bypass surgery on 10/14/2021.  She reports feeling "so'-so" today. On 07/17/2021 she was hospitalized for exacerbation of asthma and she is concerned about being told she had congestive heart failure. We reviewed her hospitalization in detail, offered reassurance per her Echo results showing grade 1 diastolic dysfunction. There is no evidence of systolic or diastolic heart failure based on her testing. She has struggled with asthma symptoms.  Since she was discharged she has not been feeling well. She has been suffering from weakness, myalgias, and nausea. With minimal exertion she becomes out of breath, and has felt pre-syncopal recently while sitting down in a waiting room. Lately she has struggled with her breathing also due to the seasonal changes/pollen.  Additionally she complains of severe loss of appetite and recent issues with dehydration since her bariatric surgery. Gatorade is one of the few things she is able to consume, and even then she is not able to drink more than 20 oz. She was instructed to not drink too much water. Yesterday she was able to eat a little bit of yogurt.  She admits to worsening depression as well. She is working with her therapist.  She denies any palpitations, chest pain, or peripheral edema. No lightheadedness, headaches, orthopnea, or PND. No bleeding  issues.   Past Medical History:  Diagnosis Date   Acute respiratory failure with hypoxia ([place])    Anemia    Asthma    COVID-19 virus infection 05/16/2020   04/27/20 dx covid-19, not hospitalized   Diabetes ([place])    on Metformin   Eczema    Edema    Heart problem    "something with the arteries on the left side of the heart"; upcoming appt with cardiology for evaluation   HTN (hypertension)    Rheumatoid arthritis ([place])    on Plaquenil    Seizures ([place])    last seizure 2019   Sleep apnea    Sleep apnea     Past Surgical History:  Procedure Laterality Date   APPENDECTOMY     CESAREAN SECTION     x3    Current Medications: Current Outpatient Medications on File Prior to Visit  Medication Sig   acetaminophen (TYLENOL) 325 MG tablet Take 2 tablets (650 mg total) by mouth every 6 (six) hours as needed for headache.   albuterol (PROAIR HFA) 108 (90 Base) MCG/ACT inhaler 2 puffs every 4 hours as needed only  if your can't catch your breath (Patient taking differently: Inhale 1-2 puffs into the lungs every 4 (four) hours as needed for wheezing or shortness of breath (only if you can't catch your breath).)   albuterol (PROVENTIL) (2.5 MG/3ML) 0.083% nebulizer solution INHALE 3 ML BY NEBULIZATION EVERY 6 HOURS AS NEEDED FOR WHEEZING OR SHORTNESS OF BREATH   azelastine (ASTELIN) 0.1 % nasal spray Place 1 spray into both  nostrils 2 (two) times daily. Use in each nostril as directed   blood glucose meter kit and supplies Dispense based on patient and insurance preference. Use up to four times daily as directed. (FOR ICD-10 E10.9, E11.9).   Blood Pressure Monitoring (SPHYGMOMANOMETER) MISC 1 each by Does not apply route daily.   busPIRone (BUSPAR) 7.5 MG tablet Take 7.5 mg by mouth 2 (two) times daily.   cetirizine (ZYRTEC) 10 MG tablet Take 1 tablet (10 mg total) by mouth daily.   Cholecalciferol 1.25 MG (50000 UT) TABS Take 1 tablet by mouth daily.   DULoxetine (CYMBALTA) 60 MG capsule  Take 60 mg by mouth daily.   EPINEPHrine 0.3 mg/0.3 mL IJ SOAJ injection Inject 0.3 mg into the muscle as needed for anaphylaxis.   escitalopram (LEXAPRO) 20 MG tablet Take 1 tablet by mouth every evening.   famotidine (PEPCID) 20 MG tablet TAKE 1 TABLET BY MOUTH EVERY DAY   ferrous sulfate 325 (65 FE) MG tablet TAKE 1 TABLET BY MOUTH TWICE A DAY   fluticasone (FLONASE) 50 MCG/ACT nasal spray SPRAY 1 SPRAY INTO BOTH NOSTRILS DAILY.   Fluticasone-Umeclidin-Vilant (TRELEGY ELLIPTA) 200-62.5-25 MCG/ACT AEPB Inhale 1 puff into the lungs daily.   hydrochlorothiazide (HYDRODIURIL) 25 MG tablet Take 1 tablet (25 mg total) by mouth daily. For high blood pressure.   hydroxychloroquine (PLAQUENIL) 200 MG tablet TAKE 1 TABLET BY MOUTH TWICE A DAY   ipratropium-albuterol (DUONEB) 0.5-2.5 (3) MG/3ML SOLN Take 3 mLs by nebulization every 6 (six) hours as needed.   metFORMIN (GLUCOPHAGE XR) 500 MG 24 hr tablet Take 1 tablet (500 mg total) by mouth daily with breakfast.   metoCLOPramide (REGLAN) 10 MG tablet Take 10 mg by mouth every morning.   montelukast (SINGULAIR) 10 MG tablet TAKE 1 TABLET BY MOUTH EVERYDAY AT BEDTIME   norethindrone (AYGESTIN) 5 MG tablet Take 1 tablet (5 mg total) by mouth daily.   ondansetron (ZOFRAN-ODT) 8 MG disintegrating tablet Take 1 tablet (8 mg total) by mouth every 8 (eight) hours as needed for nausea or vomiting.   pantoprazole (PROTONIX) 40 MG tablet TAKE 1 TABLET (40 MG TOTAL) BY MOUTH DAILY. TAKE 30-60 MIN BEFORE FIRST MEAL OF THE DAY   pregabalin (LYRICA) 200 MG capsule Take 1 capsule (200 mg total) by mouth in the morning, at noon, and at bedtime.   QUEtiapine (SEROQUEL) 100 MG tablet Take 50-100 mg by mouth at bedtime.   revefenacin (YUPELRI) 175 MCG/3ML nebulizer solution Take 3 mLs (175 mcg total) by nebulization daily.   rizatriptan (MAXALT-MLT) 10 MG disintegrating tablet Take 1 tablet earliest onset of migraine.  May repeat in 2 hours if needed. Maximum 2 tablets in 24  hours   triamcinolone (KENALOG) 0.025 % ointment APPLY TO AFFECTED AREA TWICE A DAY   triamcinolone ointment (KENALOG) 0.5 % Apply 1 application topically as needed.   zonisamide (ZONEGRAN) 100 MG capsule Take 1 capsule (100 mg total) by mouth daily.   levETIRAcetam (KEPPRA) 500 MG tablet Take 1 tablet (500 mg total) by mouth 2 (two) times daily.   No current facility-administered medications on file prior to visit.     Allergies:   Ferumoxytol, Phenytoin, Pecan nut (diagnostic), and Phenylbutazones   Social History   Tobacco Use   Smoking status: Former    Years: 10.00    Types: Cigarettes    Quit date: 06/02/2013    Years since quitting: 8.4   Smokeless tobacco: Never   Tobacco comments:    during the 10  years of smoking, smoked 2-3 cigarettes/day  Vaping Use   Vaping Use: Never used  Substance Use Topics   Alcohol use: Not Currently    Comment: occ   Drug use: Not Currently    Family History: family history includes Angioedema in her daughter; Asthma in her daughter, maternal aunt, and son; Breast cancer in her cousin and cousin; Cervical cancer in her maternal aunt; Dementia in her maternal aunt; Diabetes in her brother, brother, father, paternal aunt, paternal grandmother, sister, and another family member; Heart Problems in her maternal grandmother and sister; Heart attack in an other family member; High blood pressure in her mother and another family member; Rheum arthritis in her mother; Seizures in her cousin, cousin, and cousin.  ROS:   Please see the history of present illness. (+) Shortness of breath (+) Weakness (+) Myalgias (+) Nausea (+) Pre-syncope (+) Depression All other systems are reviewed and negative.    EKGs/Labs/Other Studies Reviewed:    The following studies were reviewed today:  Right LE Venous Doppler 09/21/2021: FINDINGS: VENOUS   Normal compressibility of the common femoral, superficial femoral, and popliteal veins, as well as the  visualized calf veins. Visualized portions of profunda femoral vein and great saphenous vein unremarkable. No filling defects to suggest DVT on grayscale or color Doppler imaging. Doppler waveforms show normal direction of venous flow, normal respiratory plasticity and response to augmentation.   Limited views of the contralateral common femoral vein are unremarkable.   IMPRESSION: Negative for DVT in the right lower extremity  Echo 07/21/2021: Sonographer Comments: Patient is morbidly obese, echo performed with  patient supine and on artificial respirator and no subcostal window. Image  acquisition challenging due to patient body habitus.  IMPRESSIONS    1. Left ventricular ejection fraction, by estimation, is 60 to 65%. The  left ventricle has normal function. The left ventricle has no regional  wall motion abnormalities. There is moderate left ventricular hypertrophy.  Left ventricular diastolic  parameters are consistent with Grade I diastolic dysfunction (impaired  relaxation).   2. Right ventricular systolic function is normal. The right ventricular  size is normal. Tricuspid regurgitation signal is inadequate for assessing  PA pressure.   3. The mitral valve is normal in structure. No evidence of mitral valve  regurgitation. No evidence of mitral stenosis.   4. The aortic valve is tricuspid. There is mild calcification of the  aortic valve. There is mild thickening of the aortic valve. Aortic valve  regurgitation is mild. No aortic stenosis is present.   Coronary CTA with Calcium Score 06/19/2020: FINDINGS: Coronary calcium score: The patient's coronary artery calcium score is 0, which places the patient in the 0 percentile.   Coronary arteries: Normal coronary origins. Dominance unable to be determined.   Right Coronary Artery: Normal caliber vessel, not well visualized distally. No significant plaque or stenosis.   Left Main Coronary Artery: Normal caliber vessel.  No significant plaque or stenosis.   Left Anterior Descending Coronary Artery: Normal caliber vessel. No significant plaque or stenosis. Gives rise to large D1 branch.   Left Circumflex Artery: Incompletely visualized due to poor contrast opacification. Proximal LCx appears without stenosis. Mid and distal portions not well visualized   Aorta: Normal size, 35 mm at the mid ascending aorta (level of the PA bifurcation) measured double oblique. No calcifications. No dissection.   Aortic Valve: No calcifications. Trileaflet.   Other findings:   Normal pulmonary vein drainage into the left atrium.   Normal left  atrial appendage without a thrombus.   Normal size of the pulmonary artery.   Low quality contrast timing   IMPRESSION: 1. No evidence of CAD, CADRADS = 0. Caveat to this is that poor contrast opacification limits interpretation of most of left circumflex as well as distal RCA   2. Coronary calcium score of 0. This was 0 percentile for age and sex matched control.   3. Normal coronary origins. Unable to determine dominance based on this study.   Echo 01/24/20  1. Left ventricular ejection fraction, by estimation, is 55 to 60%. The  left ventricle has normal function. The left ventricle has no regional  wall motion abnormalities. Left ventricular diastolic parameters are  consistent with Grade I diastolic  dysfunction (impaired relaxation).   2. Right ventricular systolic function is normal. The right ventricular  size is normal. There is normal pulmonary artery systolic pressure.   3. Left atrial size was moderately dilated.   4. The mitral valve is normal in structure. No evidence of mitral valve  regurgitation. No evidence of mitral stenosis.   5. The aortic valve has an indeterminant number of cusps. Aortic valve  regurgitation is mild. No aortic stenosis is present.  EKG:  EKG is personally reviewed.   10/25/2021: NSR at 86 bpm, LVH 04/16/2020: NSR at 74  bpm, LV[facility]  Recent Labs: 07/17/2021: ALT 38 07/18/2021: B Natriuretic Peptide 63.0 07/22/2021: Hemoglobin 12.8; Platelets 268 07/25/2021: BUN 14; Creatinine, Ser 0.74; Magnesium 2.2; Potassium 3.2; Sodium 135   Recent Lipid Panel    Component Value Date/Time   CHOL 212 (H) 01/18/2020 1500   TRIG 94 07/21/2021 0211   HDL 82 01/18/2020 1500   CHOLHDL 2.6 01/18/2020 1500   LDLCALC 111 (H) 01/18/2020 1500    Physical Exam:    VS:  BP 114/82 (BP Location: Right Arm, Patient Position: Sitting, Cuff Size: Large)   Pulse 86   Ht 5' 4"  (1.626 m)   Wt 263 lb 4.8 oz (119.4 kg)   BMI 45.20 kg/m     Wt Readings from Last 3 Encounters:  10/25/21 263 lb 4.8 oz (119.4 kg)  09/21/21 277 lb 12.5 oz (126 kg)  09/17/21 278 lb (126.1 kg)    GEN: Well nourished, well developed in no acute distress HEENT: Normal, moist mucous membranes NECK: No JVD CARDIAC: regular rhythm, normal S1 and S2, no rubs or gallops. No murmur. VASCULAR: Radial and DP pulses 2+ bilaterally. No carotid bruits RESPIRATORY:  Clear to auscultation without rales, wheezing or rhonchi  ABDOMEN: Soft, non-tender, non-distended MUSCULOSKELETAL:  Ambulates independently with rollator SKIN: Warm and dry, no edema NEUROLOGIC:  Alert and oriented x 3. No focal neuro deficits noted. PSYCHIATRIC:  Normal affect    ASSESSMENT:    1. Essential hypertension   2. Class 3 severe obesity due to excess calories with serious comorbidity and body mass index (BMI) of 45.0 to 49.9 in adult ([place])   3. Counseling on health promotion and disease prevention   4. Cardiac risk counseling   5. OSA on CPAP     PLAN:    Hypertension: Told she has "heart failure" -we reviewed that she does not have clinical heart failure. She has mild diastolic dysfunction based on her echo, and with excellent BP control this is appropriately managed -at goal today, continue current medications -has OSA, continue CPAP  Type II diabetes: -last A1c 5.8% -not  on insulin, on metformin  Class 3 severe obesity: BMI 45.  -Discussed weight loss, diet/exercise  recommendations -discussed GLP1RA as well  Cardiac risk counseling and prevention recommendations: -recommend heart healthy/Mediterranean diet, with whole grains, fruits, vegetable, fish, lean meats, nuts, and olive oil. Limit salt. -recommend moderate walking, 3-5 times/week for 30-50 minutes each session. Aim for at least 150 minutes.week. Goal should be pace of 3 miles/hours, or walking 1.5 miles in 30 minutes -recommend avoidance of tobacco products. Avoid excess alcohol. -ASCVD risk score: The 10-year ASCVD risk score (Arnett DK, et al., 2019) is: 2.2%   Values used to calculate the score:     Age: 72 years     Sex: Female     Is Non-Hispanic African American: Yes     Diabetic: Yes     Tobacco smoker: No     Systolic Blood Pressure: 950 mmHg     Is BP treated: Yes     HDL Cholesterol: 82 mg/dL     Total Cholesterol: 226 mg/dL    Plan for follow up: PRN  Elizabeth Dresser, MD, PhD [place]  [facility]    Medication Adjustments/Labs and Tests Ordered: Current medicines are reviewed at length with the patient today.  Concerns regarding medicines are outlined above.   Orders Placed This Encounter  Procedures   EKG 12-Lead   No orders of the defined types were placed in this encounter.   Patient Instructions  Medication Instructions:  Your Physician recommend you continue on your current medication as directed.    *If you need a refill on your cardiac medications before your next appointment, please call your pharmacy*   Lab Work: None ordered today   Testing/Procedures: None ordered today   Follow-Up: At [facility], you and your health needs are our priority.  As part of our continuing mission to provide you with exceptional heart care, we have created designated Provider Care Teams.  These Care Teams include your primary Cardiologist (physician) and  Advanced Practice Providers (APPs -  Physician Assistants and Nurse Practitioners) who all work together to provide you with the care you need, when you need it.  We recommend signing up for the patient portal called "MyChart".  Sign up information is provided on this After Visit Summary.  MyChart is used to connect with patients for Virtual Visits (Telemedicine).  Patients are able to view lab/test results, encounter notes, upcoming appointments, etc.  Non-urgent messages can be sent to your provider as well.   To learn more about what you can do with MyChart, go to [email or web address].    Your next appointment:   As needed   The format for your next appointment:   In Person  Provider:   Buford Dresser, MD{            I,Mathew Stumpf,acting as a scribe for Elizabeth Dresser, MD.,have documented all relevant documentation on the behalf of Elizabeth Dresser, MD,as directed by  Elizabeth Dresser, MD while in the presence of Elizabeth Dresser, MD.  I, Elizabeth Dresser, MD, have reviewed all documentation for this visit. The documentation on 11/05/21 for the exam, diagnosis, procedures, and orders are all accurate and complete.   Signed, Elizabeth Dresser, MD PhD 10/25/2021   [place]

## 2021-10-25 NOTE — Patient Instructions (Signed)
Medication Instructions:  °Your Physician recommend you continue on your current medication as directed.   ° °*If you need a refill on your cardiac medications before your next appointment, please call your pharmacy* ° ° °Lab Work: °None ordered today ° ° °Testing/Procedures: °None ordered today ° ° °Follow-Up: °At [facility], you and your health needs are our priority.  As part of our continuing mission to provide you with exceptional heart care, we have created designated Provider Care Teams.  These Care Teams include your primary Cardiologist (physician) and Advanced Practice Providers (APPs -  Physician Assistants and Nurse Practitioners) who all work together to provide you with the care you need, when you need it. ° °We recommend signing up for the patient portal called "MyChart".  Sign up information is provided on this After Visit Summary.  MyChart is used to connect with patients for Virtual Visits (Telemedicine).  Patients are able to view lab/test results, encounter notes, upcoming appointments, etc.  Non-urgent messages can be sent to your provider as well.   °To learn more about what you can do with MyChart, go to [email or web address].   ° °Your next appointment:   °As needed  ° °The format for your next appointment:   °In Person ° °Provider:   °Bridgette Christopher, MD  ° ° ° ° °

## 2021-10-29 NOTE — Progress Notes (Signed)
Office Visit Note  Patient: Elizabeth Bautista             Date of Birth: 1976/03/10           MRN: [id number]             PCP: [name], MD Referring: [name], MD Visit Date: 11/06/2021   Subjective:   History of Present Illness: Elizabeth Bautista is a 46 y.o. 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 She was last seen almost a year ago with discussion for considering addition of Enbrel due to continued pain and inflammation. This was initially delayed due to anticipating upcoming bariatric surgery. She saw Dr. Letta Pate for ongoing pain in multiple areas and felt to represent FMS and she started Lyrica with partial improvement in pain. She was hospitalized in February with severe asthma exacerbation including ICU stay. She had the surgery on 5/15 without major complication. She is off her medications since the surgery and has been struggling some with maintaining hydration status.  Previous HPI 12/25/2020 Elizabeth Bautista is a 46 y.o. 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 She does not feel the medication has controlled symptoms well she has persistent stiffness in her hands and knees in the mornings and knee pain persists throughout the day. Her wrist has decreased in swelling. Her left shoulder is painful with decreased range of movement limited by pain. She has had asthma exacerbations treated with prednisone but currently trying to avoid this due to upcoming bariatric surgery plans.   Previous HPI: 10/11/20 Elizabeth Bautista is a 46 y.o. female here for evaluation and management of rheumatoid arthritis. Symptoms started at least since 2017 with hand, wrist, and knee pain and swelling periodically and worsened over time. She did not seek medication evaluation for some time due to being very busy with work and symptoms progressively increased. She was hospitalized for severe pain and swelling especially in the left  wrist and knee and workup at that time was more extensive and revealed positive RA serology suspected as the cause. She saw rheumatology since 2020 and started treatment initial with prednisone that did not control symptoms well but caused a lot of weight gain. She has had chronic steroid exposure due to refractory asthma symptoms over the years. She also started hydroxychloroquine that apparently helped symptoms reasonably well initially but has worsened. She moved form [place] a year ago due to safety circumstances and has some family in the area and is continuing HCQ treatment but overall having a lot of joint pain. Currently feels swelling and heat in the joints multiple days per week and walks with either a cane or walker for her left knee pain and instability depending on how badly it is acting up. She was recently hospitalized for asthma exacerbation on 4/28 and discharged 5/2.   Labs reviewed 10/2018 RF ~30, CCP >250 ANA neg   Imaging reviewed Chest xray 08/2020 Cardiomegaly no airspace disease   Lumbar spine xray 08/2020 No acute findings     Review of Systems  Constitutional:  Positive for fatigue.  HENT:  Positive for mouth dryness.   Eyes:  Negative for dryness.  Respiratory:  Positive for shortness of breath.   Cardiovascular:  Positive for swelling in legs/feet.  Gastrointestinal:  Negative for constipation.  Endocrine: Positive for heat intolerance.  Genitourinary:  Negative for difficulty urinating.  Musculoskeletal:  Positive for joint pain, gait  problem, joint pain, joint swelling, muscle weakness, morning stiffness and muscle tenderness.  Skin:  Positive for rash.  Allergic/Immunologic: Negative for susceptible to infections.  Neurological:  Positive for numbness and weakness.  Hematological:  Positive for bruising/bleeding tendency.  Psychiatric/Behavioral:  Positive for sleep disturbance.    PMFS History:  Patient Active Problem List   Diagnosis Date Noted    Spinal stenosis of lumbar region with neurogenic claudication 08/27/2021   Fall 08/27/2021   Sinusitis 08/11/2021   Allergic rhinitis 08/07/2021   Hospital discharge follow-up 08/02/2021   (HFpEF) heart failure with preserved ejection fraction ([place]) 07/20/2021   Body mass index (BMI) 45.0-49.9, adult ([place]) 02/05/2021   Iron deficiency anemia due to chronic blood loss 10/17/2020   Tension-type headache, not intractable 10/17/2020   High risk medication use 10/11/2020   Acute hip pain, left 09/19/2020   Sciatica 09/11/2020   Dysmenorrhea 06/04/2020   Menorrhagia with irregular cycle 06/04/2020   Anxiety and depression 06/04/2020   Physical deconditioning 05/24/2020   Severe persistent asthma 03/08/2020   GERD (gastroesophageal reflux disease) 03/07/2020   Anxiety 03/07/2020   History of seizures 03/07/2020   Swelling of lower extremity 02/01/2020   Essential hypertension 02/01/2020   Vitamin D deficiency 02/01/2020   Type 2 diabetes mellitus with hyperglycemia, without long-term current use of insulin ([place]) 02/01/2020   Anemia 02/01/2020   Class 3 severe obesity with serious comorbidity and body mass index (BMI) of 45.0 to 49.9 in adult ([place]) 10/22/2019   OSA on CPAP 10/18/2019   Seropositive rheumatoid arthritis ([place]) 10/18/2019    Past Medical History:  Diagnosis Date   Acute respiratory failure with hypoxia ([place])    Anemia    Asthma    COVID-19 virus infection 05/16/2020   04/27/20 dx covid-19, not hospitalized   Diabetes ([place])    on Metformin   Eczema    Edema    Heart problem    "something with the arteries on the left side of the heart"; upcoming appt with cardiology for evaluation   HTN (hypertension)    Rheumatoid arthritis ([place])    on Plaquenil    Seizures ([place])    last seizure 2019   Sleep apnea    Sleep apnea     Family History  Problem Relation Age of Onset   High blood pressure Mother    Rheum arthritis Mother    Diabetes Father    Diabetes Sister     Heart Problems Sister    Diabetes Brother    Diabetes Paternal Grandmother    Diabetes Other        father's side "everybody died from Diabetes"   High blood pressure Other        mother's side, multiple siblings with this    Heart attack Other        family member on mother's side    Diabetes Paternal Aunt    Seizures Cousin        not sibings to the other cousins with seizures   Breast cancer Cousin    Seizures Cousin        not sibings to the other cousins with seizures   Seizures Cousin        not sibings to the other cousins with seizures   Cervical cancer Maternal Aunt    Breast cancer Cousin    Dementia Maternal Aunt    Asthma Maternal Aunt    Heart Problems Maternal Grandmother    Diabetes Brother    Asthma  Daughter    Angioedema Daughter    Asthma Son    Past Surgical History:  Procedure Laterality Date   APPENDECTOMY     CESAREAN SECTION     x3   GASTRIC BYPASS     Social History   Social History Narrative   Divorced.Lives with 3 kids.Originally from [place].Came from [place] 7 months ago.      02/27/2020   Right handed   Caffeine: none     There is no immunization history on file for this patient.   Objective: Vital Signs: BP 120/84 (BP Location: Left Arm, Patient Position: Sitting, Cuff Size: Large)   Pulse 84   Resp 12   Ht 5' 4"  (1.626 m)   Wt 257 lb 9.6 oz (116.8 kg)   BMI 44.22 kg/m    Physical Exam Constitutional:      Appearance: She is obese.  Cardiovascular:     Rate and Rhythm: Normal rate and regular rhythm.  Pulmonary:     Effort: Pulmonary effort is normal.     Breath sounds: Normal breath sounds.  Musculoskeletal:     Right lower leg: No edema.     Left lower leg: No edema.  Skin:    General: Skin is warm and dry.     Findings: No rash.  Neurological:     Mental Status: She is alert.  Psychiatric:        Mood and Affect: Mood normal.     Musculoskeletal Exam:  Neck full ROM tenderness of paraspinal muscles on left  side and along left trapezius Shoulders left side pain with overhead abduction and reaching behind back, decreased external rotation tolerated, right shoulder normal Elbows full ROM no tenderness or swelling Wrists full ROM no tenderness or swelling Fingers full ROM no tenderness or swelling Knees full ROM with tenderness bilaterally and there is mild swelling present at left side Ankles full ROM no tenderness or swelling   CDAI Exam: CDAI Score: 12  Patient Global: 60 mm; Provider Global: 20 mm Swollen: 1 ; Tender: 3  Joint Exam 11/06/2021      Right  Left  Glenohumeral      Tender  Knee   Tender  Swollen Tender     Investigation: No additional findings.  Imaging: No results found.  Recent Labs: Lab Results  Component Value Date   WBC 14.7 (H) 07/22/2021   HGB 12.8 07/22/2021   PLT 268 07/22/2021   NA 135 07/25/2021   K 3.2 (L) 07/25/2021   CL 94 (L) 07/25/2021   CO2 33 (H) 07/25/2021   GLUCOSE 125 (H) 07/25/2021   BUN 14 07/25/2021   CREATININE 0.74 07/25/2021   BILITOT 0.2 (L) 07/17/2021   ALKPHOS 101 07/17/2021   AST 35 07/17/2021   ALT 38 07/17/2021   PROT 7.0 07/17/2021   ALBUMIN 3.4 (L) 07/20/2021   CALCIUM 8.7 (L) 07/25/2021   GFRAA 121 10/11/2020   QFTBGOLDPLUS NEGATIVE 10/11/2020    Speciality Comments: No specialty comments available.  Procedures:  No procedures performed Allergies: Ferumoxytol, Phenytoin, Pecan nut (diagnostic), and Phenylbutazones   Assessment / Plan:     Visit Diagnoses: Seropositive rheumatoid arthritis ([place]) - Plan: Rheumatoid factor, C-reactive protein, Sedimentation rate  Only few joints involved on exam today but having significant stiffness and pain limiting mobility. Lymphedema is the best I have seen for her. Rechecking labs for disease monitoring RF, ESR, CRP. Sed rate may remain abnormal from recent surgery. She should resume her current medication  HCQ 400 mg daily as next step. Can f/u within 2-3 months to reassess  whether or not increase in treatment.  High risk medication use - hydroxychloroquine 400 mg p.o. daily.  PLQ eye exam?  No recent ophthalmology exam record provided her with form to request exam records. Currently off medication for concern of GI tolerance  Severe persistent asthma with exacerbation  Currently controlled after severe exacerbation about 3-4 months ago.  Orders: Orders Placed This Encounter  Procedures   Rheumatoid factor   C-reactive protein   Sedimentation rate   No orders of the defined types were placed in this encounter.    Follow-Up Instructions: Return in about 2 months (around 01/21/2022) for RA on HCQ.   Collier Salina, MD  Note - This record has been created using [employer].  Chart creation errors have been sought, but may not always  have been located. Such creation errors do not reflect on  the standard of medical care.

## 2021-10-31 ENCOUNTER — Ambulatory Visit: Payer: Medicaid Other | Admitting: Internal Medicine

## 2021-11-06 ENCOUNTER — Encounter: Payer: Self-pay | Admitting: Internal Medicine

## 2021-11-06 ENCOUNTER — Ambulatory Visit (INDEPENDENT_AMBULATORY_CARE_PROVIDER_SITE_OTHER): Payer: Medicaid Other | Admitting: Internal Medicine

## 2021-11-06 VITALS — BP 120/84 | HR 84 | Resp 12 | Ht 64.0 in | Wt 257.6 lb

## 2021-11-06 DIAGNOSIS — M059 Rheumatoid arthritis with rheumatoid factor, unspecified: Secondary | ICD-10-CM | POA: Diagnosis not present

## 2021-11-06 DIAGNOSIS — Z79899 Other long term (current) drug therapy: Secondary | ICD-10-CM

## 2021-11-06 DIAGNOSIS — J4551 Severe persistent asthma with (acute) exacerbation: Secondary | ICD-10-CM | POA: Diagnosis not present

## 2021-11-06 NOTE — Patient Instructions (Signed)
I recommend checking out the [facility] patient-centered guide for fibromyalgia and chronic pain management: [email or web address]  Fibromyalgia Myofascial pain syndrome and fibromyalgia are both pain disorders. You may feel this pain mainly in your muscles. Fibromyalgia: Has muscle pains and tenderness that come and go. Is often associated with tiredness (fatigue) and sleep problems. Has trigger points. Tends to be long-lasting (chronic), but is not life-threatening. What are the causes? The exact causes of these conditions are not known. What increases the risk? You are more likely to develop this condition if: You have a family history of the condition. You are female. You have certain triggers, such as: Spine disorders. An injury (trauma) or other physical stressors. Being under a lot of stress. Medical conditions such as osteoarthritis, rheumatoid arthritis, or lupus. What are the signs or symptoms? Fibromyalgia The main symptom of fibromyalgia is widespread pain and tenderness in your muscles. Pain is sometimes described as stabbing, shooting, or burning. You may also have: Tingling or numbness. Sleep problems and fatigue. Problems with attention and concentration (fibro fog). Other symptoms may include: Bowel and bladder problems. Headaches. Vision problems. Sensitivity to odors and noises. Depression or mood changes. Painful menstrual periods (dysmenorrhea). Dry skin or eyes. These symptoms can vary over time. How is this diagnosed? This condition may be diagnosed by your symptoms and medical history. You will also have a physical exam. In general: Fibromyalgia is diagnosed if you have pain, fatigue, and other symptoms for more than 3 months, and symptoms cannot be explained by another condition. How is this treated? Treatment for these conditions depends on the type that you have. For fibromyalgia a healthy lifestyle is the most important treatment  including aerobic and strength exercises. Different types of medicines are used to help treat pain and include: NSAIDs. Medicines for treating depression. Medicines that help control seizures. Medicines that relax the muscles. Treating these conditions often requires a team of health care providers. These may include: Your primary care provider. A physical therapist. Complementary health care providers, such as massage therapists or acupuncturists. A psychiatrist for cognitive behavioral therapy. Follow these instructions at home: Medicines Take over-the-counter and prescription medicines only as told by your health care provider. Ask your health care provider if the medicine prescribed to you: Requires you to avoid driving or using machinery. Can cause constipation. You may need to take these actions to prevent or treat constipation: Drink enough fluid to keep your urine pale yellow. Take over-the-counter or prescription medicines. Eat foods that are high in fiber, such as beans, whole grains, and fresh fruits and vegetables. Limit foods that are high in fat and processed sugars, such as fried or sweet foods. Lifestyle  Do exercises as told by your health care provider or physical therapist. Practice relaxation techniques to control your stress. You may want to try: Biofeedback. Visual imagery. Hypnosis. Muscle relaxation. Yoga. Meditation. Maintain a healthy lifestyle. This includes eating a healthy diet and getting enough sleep. Do not use any products that contain nicotine or tobacco. These products include cigarettes, chewing tobacco, and vaping devices, such as e-cigarettes. If you need help quitting, ask your health care provider. General instructions Talk to your health care provider about complementary treatments, such as acupuncture or massage. Do not do activities that stress or strain your muscles. This includes repetitive motions and heavy lifting. Keep all follow-up  visits. This is important. Where to find support Consider joining a support group with others who are diagnosed with this condition. National Fibromyalgia Association:  www.fmaware.org Where to find more information American Chronic Pain Association: www.theacpa.org Contact a health care provider if: You have new symptoms. Your symptoms get worse or your pain is severe. You have side effects from your medicines. You have trouble sleeping. Your condition is causing depression or anxiety. Get help right away if: You have thoughts of hurting yourself or others. Get help right awayif you feel like you may hurt yourself or others, or have thoughts about taking your own life. Go to your nearest emergency room or: Call 911. Call the [facility] at [phone] or 988. This is open 24 hours a day. Text the Crisis Text Line at 806-242-4559. Summary Myofascial pain syndrome and fibromyalgia are pain disorders. Myofascial pain syndrome has tender points in the muscles that will cause pain when pressed (trigger points). Fibromyalgia also has muscle pains and tenderness that come and go, but this condition is often associated with fatigue and sleep disturbances. Fibromyalgia and myofascial pain syndrome are not the same but often occur together, causing pain and fatigue that make day-to-day activities difficult. Follow your health care provider's instructions for taking medicines and maintaining a healthy lifestyle. This information is not intended to replace advice given to you by your health care provider. Make sure you discuss any questions you have with your health care provider. Document Revised: 04/19/2021 Document Reviewed: 04/19/2021 Elsevier Patient Education  [place].

## 2021-11-07 ENCOUNTER — Ambulatory Visit: Payer: Medicaid Other | Admitting: Internal Medicine

## 2021-11-07 LAB — C-REACTIVE PROTEIN: CRP: 13.3 mg/L — ABNORMAL HIGH (ref <8.0)

## 2021-11-07 LAB — RHEUMATOID FACTOR: Rheumatoid fact SerPl-aCnc: <14 IU/mL (ref <14)

## 2021-11-07 LAB — SEDIMENTATION RATE: Sed Rate: 11 mm/h (ref 0–20)

## 2021-11-09 ENCOUNTER — Emergency Department (HOSPITAL_COMMUNITY): Payer: Medicaid Other

## 2021-11-09 ENCOUNTER — Emergency Department (HOSPITAL_COMMUNITY)
Admission: EM | Admit: 2021-11-09 | Discharge: 2021-11-09 | Disposition: A | Discharge status: Home / Self Care | Payer: Medicaid Other | Attending: Emergency Medicine | Admitting: Emergency Medicine

## 2021-11-09 ENCOUNTER — Encounter (HOSPITAL_COMMUNITY): Payer: Self-pay | Admitting: *Deleted

## 2021-11-09 ENCOUNTER — Other Ambulatory Visit: Payer: Self-pay

## 2021-11-09 DIAGNOSIS — R112 Nausea with vomiting, unspecified: Secondary | ICD-10-CM | POA: Diagnosis not present

## 2021-11-09 DIAGNOSIS — R1110 Vomiting, unspecified: Secondary | ICD-10-CM

## 2021-11-09 DIAGNOSIS — R197 Diarrhea, unspecified: Secondary | ICD-10-CM | POA: Insufficient documentation

## 2021-11-09 DIAGNOSIS — R1084 Generalized abdominal pain: Secondary | ICD-10-CM | POA: Diagnosis present

## 2021-11-09 DIAGNOSIS — Z79899 Other long term (current) drug therapy: Secondary | ICD-10-CM | POA: Insufficient documentation

## 2021-11-09 DIAGNOSIS — R7309 Other abnormal glucose: Secondary | ICD-10-CM | POA: Insufficient documentation

## 2021-11-09 LAB — COMPREHENSIVE METABOLIC PANEL
ALT: 21 U/L (ref 0–44)
AST: 17 U/L (ref 15–41)
Albumin: 3.7 g/dL (ref 3.5–5.0)
Alkaline Phosphatase: 72 U/L (ref 38–126)
Anion gap: 6 (ref 5–15)
BUN: 7 mg/dL (ref 6–20)
CO2: 21 mmol/L — ABNORMAL LOW (ref 22–32)
Calcium: 8.4 mg/dL — ABNORMAL LOW (ref 8.9–10.3)
Chloride: 107 mmol/L (ref 98–111)
Creatinine, Ser: 0.74 mg/dL (ref 0.44–1.00)
GFR, Estimated: >60 mL/min (ref >60)
Glucose, Bld: 107 mg/dL — ABNORMAL HIGH (ref 70–99)
Potassium: 3.6 mmol/L (ref 3.5–5.1)
Sodium: 134 mmol/L — ABNORMAL LOW (ref 135–145)
Total Bilirubin: 0.9 mg/dL (ref 0.3–1.2)
Total Protein: 7 g/dL (ref 6.5–8.1)

## 2021-11-09 LAB — CBC WITH DIFFERENTIAL/PLATELET
Abs Immature Granulocytes: 0.01 10*3/uL (ref 0.00–0.07)
Basophils Absolute: 0 10*3/uL (ref 0.0–0.1)
Basophils Relative: 1 %
Eosinophils Absolute: 0.3 10*3/uL (ref 0.0–0.5)
Eosinophils Relative: 6 %
HCT: 38.4 % (ref 36.0–46.0)
Hemoglobin: 12.5 g/dL (ref 12.0–15.0)
Immature Granulocytes: 0 %
Lymphocytes Relative: 27 %
Lymphs Abs: 1.4 10*3/uL (ref 0.7–4.0)
MCH: 25.2 pg — ABNORMAL LOW (ref 26.0–34.0)
MCHC: 32.6 g/dL (ref 30.0–36.0)
MCV: 77.4 fL — ABNORMAL LOW (ref 80.0–100.0)
Monocytes Absolute: 0.4 10*3/uL (ref 0.1–1.0)
Monocytes Relative: 8 %
Neutro Abs: 2.9 10*3/uL (ref 1.7–7.7)
Neutrophils Relative %: 58 %
Platelets: 207 10*3/uL (ref 150–400)
RBC: 4.96 MIL/uL (ref 3.87–5.11)
RDW: 17.1 % — ABNORMAL HIGH (ref 11.5–15.5)
WBC: 5.1 10*3/uL (ref 4.0–10.5)
nRBC: 0 % (ref 0.0–0.2)

## 2021-11-09 LAB — LIPASE, BLOOD: Lipase: 28 U/L (ref 11–51)

## 2021-11-09 LAB — PREGNANCY, URINE: Preg Test, Ur: NEGATIVE

## 2021-11-09 IMAGING — CT CT ABD-PELV W/ CM
2 of 5 series · 16 of 46 positions shown, 18 images · IV contrast (Omnipaque or Isovue)
Comparison: None Available.

CLINICAL DATA: General abdominal pain since yesterday, gastric
bypass last month, orally stools

EXAM:
CT ABDOMEN AND PELVIS WITH CONTRAST
TECHNIQUE: Multidetector CT imaging of the abdomen and pelvis was performed
using the standard protocol following bolus administration of
intravenous contrast.

[Series 2: axial st · axial · 0.84mm/px · z∈[+823,+1268]mm · 13 of 101 slices shown, 15 images]
[im 6/101  soft-tissue]
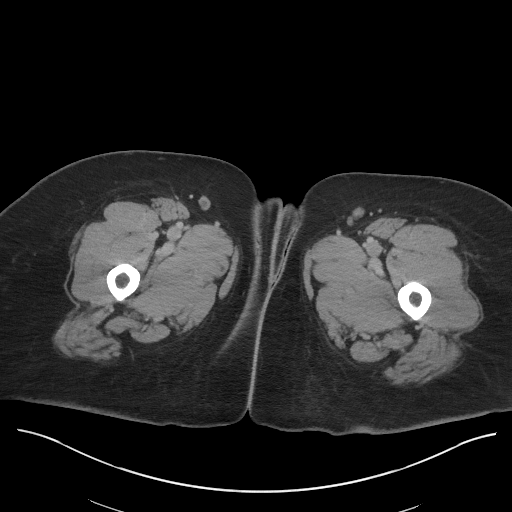
[im 6/101  bone]
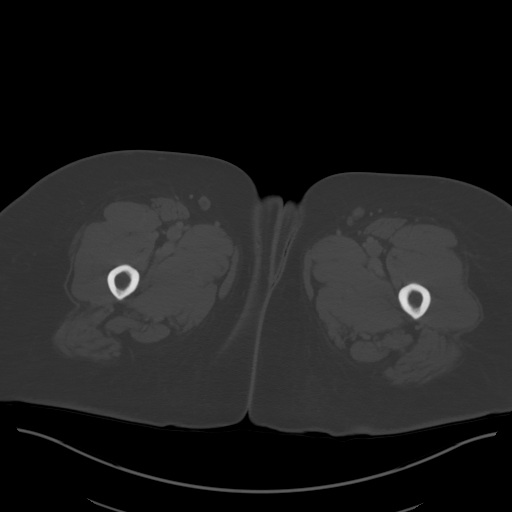
[im 12/101  soft-tissue]
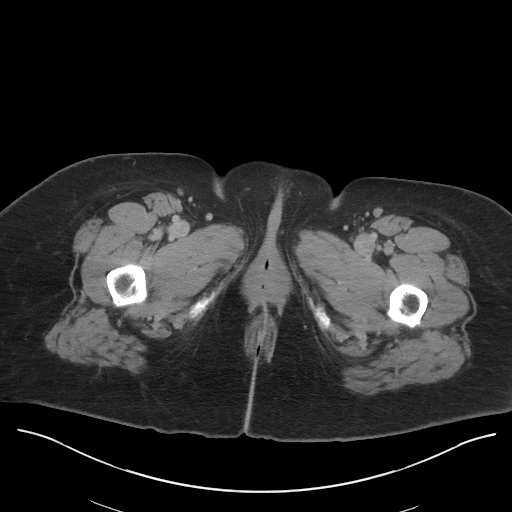
[im 23/101  soft-tissue]
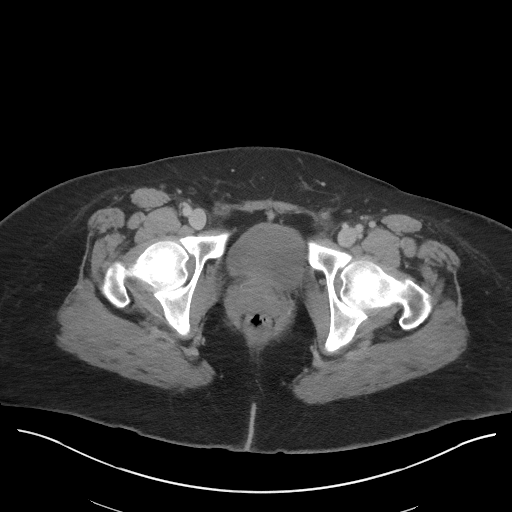
[im 28/101  soft-tissue]
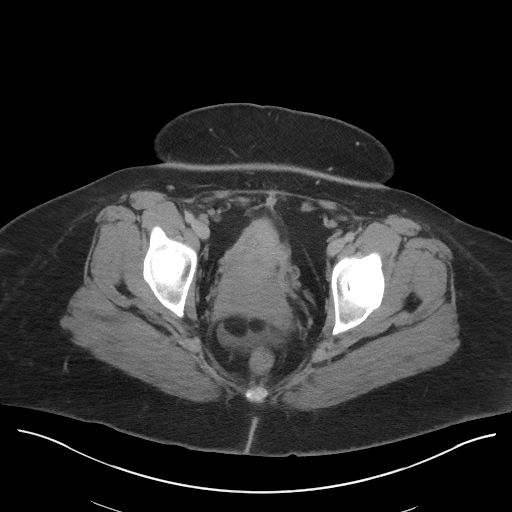
[im 34/101  soft-tissue]
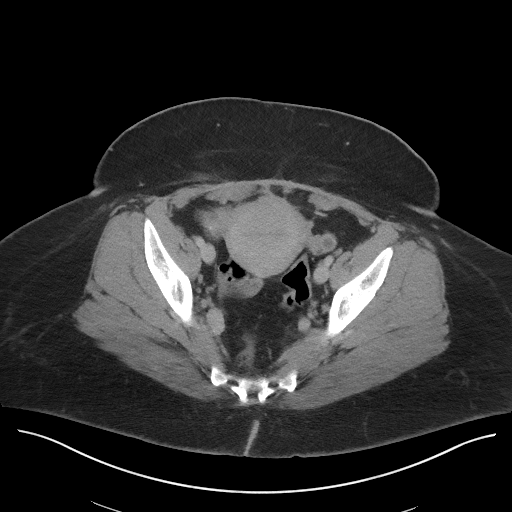
[im 45/101  soft-tissue]
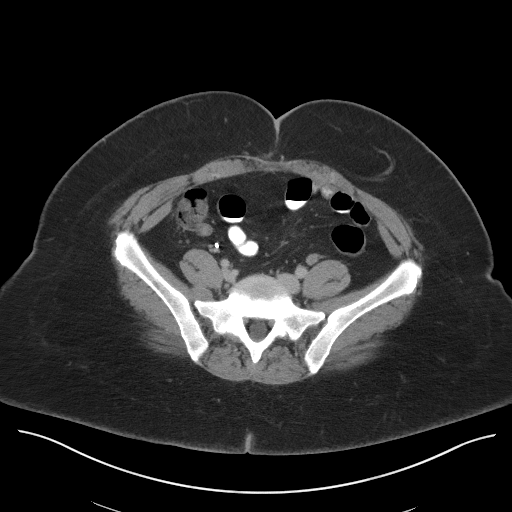
[im 51/101  soft-tissue]
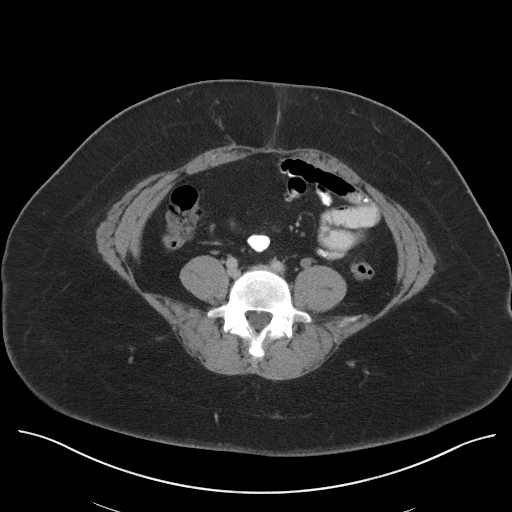
[im 56/101  soft-tissue]
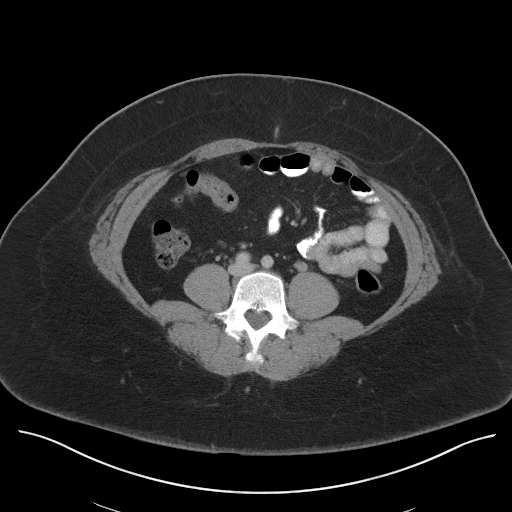
[im 67/101  soft-tissue]
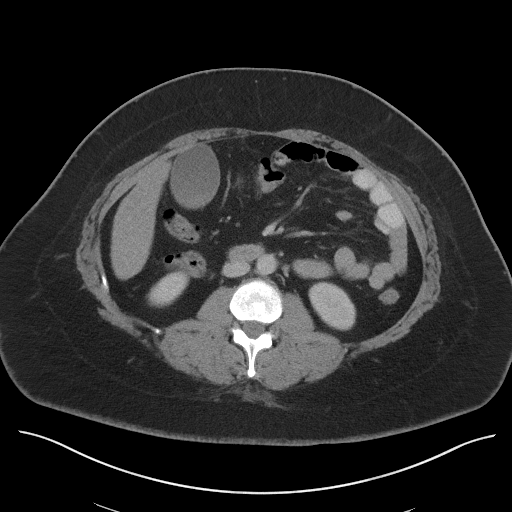
[im 67/101  bone]
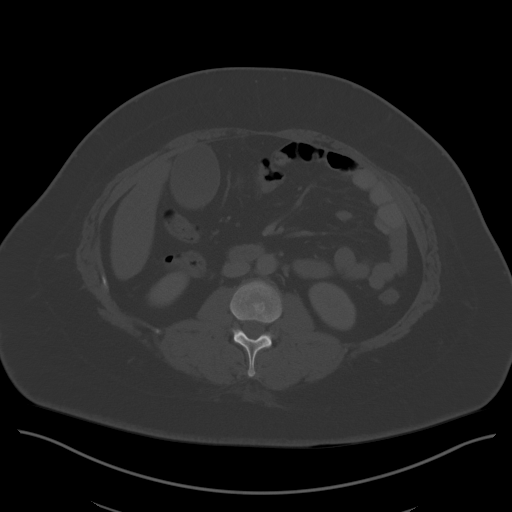
[im 73/101  soft-tissue]
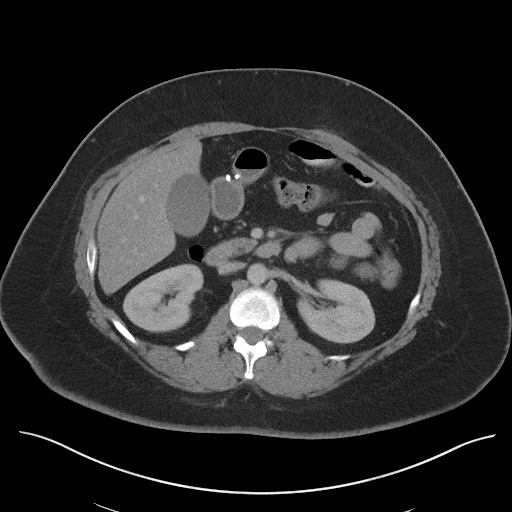
[im 78/101  soft-tissue]
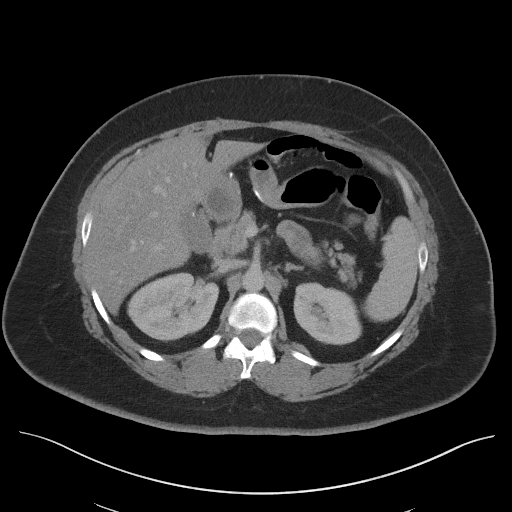
[im 89/101  soft-tissue]
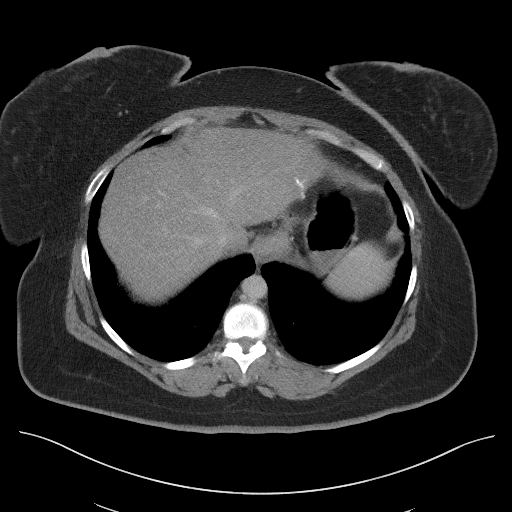
[im 95/101  soft-tissue]
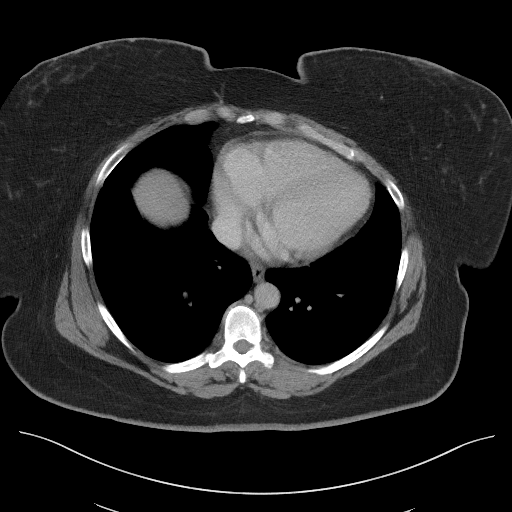

[Series 4: coronal st · coronal · 0.88mm/px · 3 of 117 slices shown]
[im 39/117  soft-tissue]
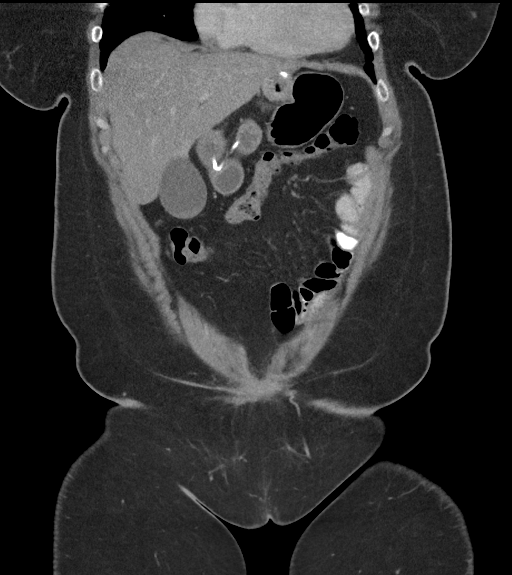
[im 52/117  soft-tissue]
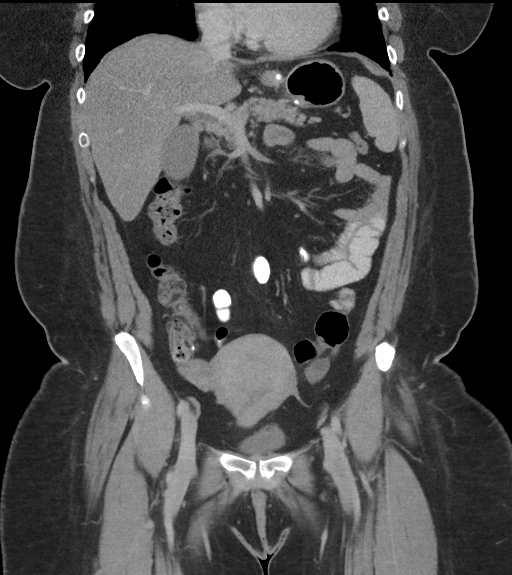
[im 65/117  soft-tissue]
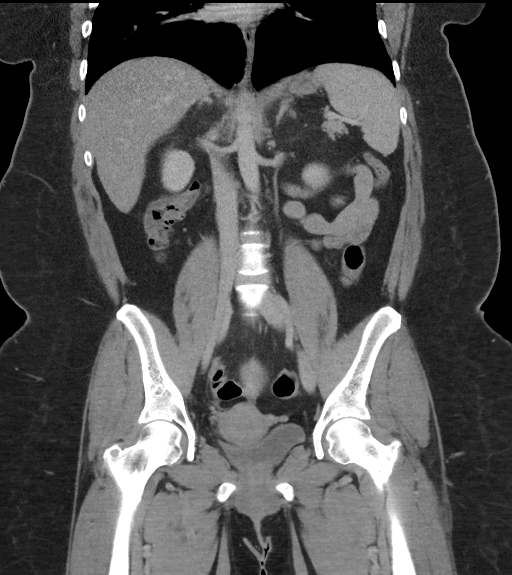

[16 of 46 positions shown; findings below may reference images not displayed]

RADIATION DOSE REDUCTION: This exam was performed according to the
departmental dose-optimization program which includes automated
exposure control, adjustment of the mA and/or kV according to
patient size and/or use of iterative reconstruction technique.

CONTRAST:  100mL OMNIPAQUE IOHEXOL 300 MG/ML SOLN, additional oral
enteric contrast
FINDINGS: Lower chest: No acute abnormality.

Hepatobiliary: No solid liver abnormality is seen. Hepatic
steatosis. No gallstones, gallbladder wall thickening, or biliary
dilatation.

Pancreas: Unremarkable. No pancreatic ductal dilatation or
surrounding inflammatory changes.

Spleen: Normal in size without significant abnormality.

Adrenals/Urinary Tract: Adrenal glands are unremarkable. Kidneys are
normal, without renal calculi, solid lesion, or hydronephrosis.
Bladder is unremarkable.

Stomach/Bowel: Status post Roux type gastric bypass. Appendix is
surgically absent. No evidence of bowel wall thickening, distention,
or inflammatory changes.

Vascular/Lymphatic: No significant vascular findings are present. No
enlarged abdominal or pelvic lymph nodes.

Reproductive: No mass or other significant abnormality. Uterine
C-section scar.

Other: No abdominal wall hernia or abnormality. No ascites.

Musculoskeletal: No acute or significant osseous findings.
IMPRESSION: 1. No acute CT findings of the abdomen or pelvis to explain pain.
2. Status post Roux type gastric bypass. No evidence of
postoperative complication such as fluid collection.
3. Hepatic steatosis.

## 2021-11-09 MED ORDER — HYDROCODONE-ACETAMINOPHEN 5-325 MG PO TABS: 1.0000 | ORAL_TABLET | ORAL | 0 refills | Status: DC | PRN

## 2021-11-09 MED ORDER — HYDROMORPHONE HCL 1 MG/ML IJ SOLN
0.5000 mg | Freq: Once | INTRAMUSCULAR | Status: AC
  Administered 2021-11-09: 0.5 mg via INTRAVENOUS
  Filled 2021-11-09: qty 0.5

## 2021-11-09 MED ORDER — IOHEXOL 300 MG/ML  SOLN
100.0000 mL | Freq: Once | INTRAMUSCULAR | Status: AC | PRN
  Administered 2021-11-09: 100 mL via INTRAVENOUS

## 2021-11-09 MED ORDER — IOHEXOL 9 MG/ML PO SOLN
ORAL | Status: AC
  Administered 2021-11-09: 1000 mL
  Filled 2021-11-09: qty 1000

## 2021-11-09 NOTE — ED Notes (Signed)
Patient transported to CT 

## 2021-11-09 NOTE — Discharge Instructions (Addendum)
Return if any problems.  Take zofran for nausea

## 2021-11-09 NOTE — ED Triage Notes (Signed)
Pt stats she had gastric bypass surgery last month and started having abdominal pain yesterday with an oily appearance to her stools

## 2021-11-10 NOTE — ED Provider Notes (Signed)
[facility] EMERGENCY DEPARTMENT Provider Note   CSN: [id number] Arrival date & time: 11/09/21  1024     History  Chief Complaint  Patient presents with   Abdominal Pain    Elizabeth Bautista is a 45 y.o. female.  Complains of vomiting and diarrhea.  Patient reports she had surgery 51/5 to have a gastric sleeve placed for weight loss.  Patient complains of passing very oily stool.  Patient complains of diffuse abdominal pain.  Patient reports she is mostly on a liquid diet.  The history is provided by the patient. No language interpreter was used.  Abdominal Pain Pain location:  Generalized Onset quality:  Gradual Duration:  2 days Chronicity:  New Context: not alcohol use, not recent travel, not sick contacts and not suspicious food intake   Relieved by:  Nothing Worsened by:  Nothing Ineffective treatments:  None tried Associated symptoms: diarrhea and nausea   Associated symptoms: no anorexia        Home Medications Prior to Admission medications   Medication Sig Start Date End Date Taking? Authorizing Provider  albuterol (PROAIR HFA) 108 (90 Base) MCG/ACT inhaler 2 puffs every 4 hours as needed only  if your can't catch your breath Patient taking differently: Inhale 1-2 puffs into the lungs every 4 (four) hours as needed for wheezing or shortness of breath (only if you can't catch your breath). 08/22/20  Yes [name], MD  albuterol (PROVENTIL) (2.5 MG/3ML) 0.083% nebulizer solution INHALE 3 ML BY NEBULIZATION EVERY 6 HOURS AS NEEDED FOR WHEEZING OR SHORTNESS OF BREATH Patient taking differently: Take 2.5 mg by nebulization every 6 (six) hours as needed for wheezing or shortness of breath. 09/26/21  Yes [name], MD  azelastine (ASTELIN) 0.1 % nasal spray Place 1 spray into both nostrils 2 (two) times daily. Use in each nostril as directed Patient taking differently: Place 1 spray into both nostrils 2 (two) times daily as needed for rhinitis or allergies. 02/22/21   Yes Chesley Mires, MD  cetirizine (ZYRTEC) 10 MG tablet Take 1 tablet (10 mg total) by mouth daily. 08/06/21  Yes Martyn Ehrich, NP  Cholecalciferol 1.25 MG (50000 UT) TABS Take 1 tablet by mouth daily. 02/01/20  Yes Ailene Ards, NP  EPINEPHrine 0.3 mg/0.3 mL IJ SOAJ injection Inject 0.3 mg into the muscle as needed for anaphylaxis. 09/26/20  Yes Chesley Mires, MD  famotidine (PEPCID) 20 MG tablet TAKE 1 TABLET BY MOUTH EVERY DAY Patient taking differently: Take 20 mg by mouth daily. 09/25/21  Yes Chesley Mires, MD  ferrous sulfate 325 (65 FE) MG tablet TAKE 1 TABLET BY MOUTH TWICE A DAY Patient taking differently: Take 325 mg by mouth 2 (two) times daily with a meal. 11/29/20  Yes Patel, Colin Broach, MD  fluticasone (FLONASE) 50 MCG/ACT nasal spray SPRAY 1 SPRAY INTO BOTH NOSTRILS DAILY. Patient taking differently: Place 1 spray into both nostrils daily. 11/14/20  Yes Valentina Shaggy, MD  Fluticasone-Umeclidin-Vilant (TRELEGY ELLIPTA) 200-62.5-25 MCG/ACT AEPB Inhale 1 puff into the lungs daily. 08/06/21  Yes Martyn Ehrich, NP  HYDROcodone-acetaminophen (NORCO/VICODIN) 5-325 MG tablet Take 1 tablet by mouth every 4 (four) hours as needed for moderate pain. 11/09/21 11/09/22 Yes Caryl Ada K, PA-C  ipratropium-albuterol (DUONEB) 0.5-2.5 (3) MG/3ML SOLN Take 3 mLs by nebulization every 6 (six) hours as needed. Patient taking differently: Take 3 mLs by nebulization every 6 (six) hours as needed (SHORTNESS OF BREATH). 08/06/21  Yes Martyn Ehrich, NP  montelukast (  SINGULAIR) 10 MG tablet TAKE 1 TABLET BY MOUTH EVERYDAY AT BEDTIME Patient taking differently: Take 10 mg by mouth at bedtime. 08/16/21  Yes Valentina Shaggy, MD  ondansetron (ZOFRAN-ODT) 8 MG disintegrating tablet Take 1 tablet (8 mg total) by mouth every 8 (eight) hours as needed for nausea or vomiting. 08/21/21  Yes Jaffe, Adam R, DO  pantoprazole (PROTONIX) 40 MG tablet TAKE 1 TABLET (40 MG TOTAL) BY MOUTH DAILY. TAKE 30-60 MIN  BEFORE FIRST MEAL OF THE DAY 08/29/21  Yes Chesley Mires, MD  revefenacin (YUPELRI) 175 MCG/3ML nebulizer solution Take 3 mLs (175 mcg total) by nebulization daily. 07/26/21  Yes Phillips Grout, MD  rizatriptan (MAXALT-MLT) 10 MG disintegrating tablet Take 1 tablet earliest onset of migraine.  May repeat in 2 hours if needed. Maximum 2 tablets in 24 hours Patient taking differently: Take 10 mg by mouth as needed for migraine. Take 1 tablet earliest onset of migraine.  May repeat in 2 hours if needed. Maximum 2 tablets in 24 hours 08/21/21  Yes Jaffe, Adam R, DO  ursodiol (ACTIGALL) 300 MG capsule Take 300 mg by mouth 2 (two) times daily.   Yes [provider]  blood glucose meter kit and supplies Dispense based on patient and insurance preference. Use up to four times daily as directed. (FOR ICD-10 E10.9, E11.9). 02/01/20   Ailene Ards, NP  Blood Pressure Monitoring [facility]) MISC 1 each by Does not apply route daily. 01/18/20   Ailene Ards, NP  busPIRone (BUSPAR) 7.5 MG tablet Take 7.5 mg by mouth 2 (two) times daily. 09/05/19   [provider]  DULoxetine (CYMBALTA) 60 MG capsule Take 60 mg by mouth daily. 04/17/21   [provider]  escitalopram (LEXAPRO) 20 MG tablet Take 1 tablet by mouth every evening. 02/29/20   [provider]  hydrochlorothiazide (HYDRODIURIL) 25 MG tablet Take 1 tablet (25 mg total) by mouth daily. For high blood pressure. 05/16/21   [name], MD  hydroxychloroquine (PLAQUENIL) 200 MG tablet TAKE 1 TABLET BY MOUTH TWICE A DAY Patient taking differently: Take 200 mg by mouth 2 (two) times daily. 07/11/21   Collier Salina, MD  levETIRAcetam (KEPPRA) 500 MG tablet Take 1 tablet (500 mg total) by mouth 2 (two) times daily. 03/13/20   Manuella Ghazi, Pratik D, DO  metFORMIN (GLUCOPHAGE XR) 500 MG 24 hr tablet Take 1 tablet (500 mg total) by mouth daily with breakfast. 05/16/21   [name], MD  metoCLOPramide (REGLAN) 10 MG tablet Take  10 mg by mouth every morning. 08/06/21   [provider]  norethindrone (AYGESTIN) 5 MG tablet Take 1 tablet (5 mg total) by mouth daily. 08/20/21 11/18/21  Janyth Pupa, DO  pregabalin (LYRICA) 200 MG capsule Take 1 capsule (200 mg total) by mouth in the morning, at noon, and at bedtime. 09/17/21   Kirsteins, Luanna Salk, MD  QUEtiapine (SEROQUEL) 100 MG tablet Take 50-100 mg by mouth at bedtime. 06/24/21   [provider]  zonisamide (ZONEGRAN) 100 MG capsule Take 1 capsule (100 mg total) by mouth daily. 09/24/21   Pieter Partridge, DO      Allergies    Ferumoxytol, Phenytoin, Pecan nut (diagnostic), and Phenylbutazones    Review of Systems   Review of Systems  Gastrointestinal:  Positive for abdominal pain, diarrhea and nausea. Negative for anorexia.  All other systems reviewed and are negative.   Physical Exam Updated Vital Signs BP 118/81 (BP Location: Left Arm)  Pulse 78   Temp 98.1 F (36.7 C) (Oral)   Resp 15   Ht _0  (1.626 m)   Wt 115.7 kg   LMP 10/26/2021   SpO2 100%   BMI 43.77 kg/m  Physical Exam Vitals and nursing note reviewed.  Constitutional:      Appearance: She is well-developed.  HENT:     Head: Normocephalic.     Mouth/Throat:     Mouth: Mucous membranes are moist.  Cardiovascular:     Rate and Rhythm: Normal rate.  Pulmonary:     Effort: Pulmonary effort is normal.  Abdominal:     General: Abdomen is flat. Bowel sounds are normal. There is no distension.     Palpations: Abdomen is soft.     Tenderness: There is generalized abdominal tenderness.     Hernia: No hernia is present.  Musculoskeletal:        General: Normal range of motion.     Cervical back: Normal range of motion.  Skin:    General: Skin is warm.  Neurological:     General: No focal deficit present.     Mental Status: She is alert and oriented to person, place, and time.     ED Results / Procedures / Treatments   Labs (all labs ordered are listed, but only  abnormal results are displayed) Labs Reviewed  CBC WITH DIFFERENTIAL/PLATELET - Abnormal; Notable for the following components:      Result Value   MCV 77.4 (*)    MCH 25.2 (*)    RDW 17.1 (*)    All other components within normal limits  COMPREHENSIVE METABOLIC PANEL - Abnormal; Notable for the following components:   Sodium 134 (*)    CO2 21 (*)    Glucose, Bld 107 (*)    Calcium 8.4 (*)    All other components within normal limits  LIPASE, BLOOD  PREGNANCY, URINE    EKG None  Radiology CT ABDOMEN PELVIS W CONTRAST  Result Date: 11/09/2021 CLINICAL DATA:  General abdominal pain since yesterday, gastric bypass last month, orally stools EXAM: CT ABDOMEN AND PELVIS WITH CONTRAST TECHNIQUE: Multidetector CT imaging of the abdomen and pelvis was performed using the standard protocol following bolus administration of intravenous contrast. RADIATION DOSE REDUCTION: This exam was performed according to the departmental dose-optimization program which includes automated exposure control, adjustment of the mA and/or kV according to patient size and/or use of iterative reconstruction technique. CONTRAST:  153m OMNIPAQUE IOHEXOL 300 MG/ML SOLN, additional oral enteric contrast COMPARISON:  None Available. FINDINGS: Lower chest: No acute abnormality. Hepatobiliary: No solid liver abnormality is seen. Hepatic steatosis. No gallstones, gallbladder wall thickening, or biliary dilatation. Pancreas: Unremarkable. No pancreatic ductal dilatation or surrounding inflammatory changes. Spleen: Normal in size without significant abnormality. Adrenals/Urinary Tract: Adrenal glands are unremarkable. Kidneys are normal, without renal calculi, solid lesion, or hydronephrosis. Bladder is unremarkable. Stomach/Bowel: Status post Roux type gastric bypass. Appendix is surgically absent. No evidence of bowel wall thickening, distention, or inflammatory changes. Vascular/Lymphatic: No significant vascular findings are  present. No enlarged abdominal or pelvic lymph nodes. Reproductive: No mass or other significant abnormality. Uterine C-section scar. Other: No abdominal wall hernia or abnormality. No ascites. Musculoskeletal: No acute or significant osseous findings. IMPRESSION: 1. No acute CT findings of the abdomen or pelvis to explain pain. 2. Status post Roux type gastric bypass. No evidence of postoperative complication such as fluid collection. 3. Hepatic steatosis. Electronically Signed   By: AJamse MeadD.  On: 11/09/2021 15:39    Procedures Procedures    Medications Ordered in ED Medications  iohexol (OMNIPAQUE) 9 MG/ML oral solution (1,000 mLs  Contrast Given 11/09/21 1315)  HYDROmorphone (DILAUDID) injection 0.5 mg (0.5 mg Intravenous Given 11/09/21 1446)  iohexol (OMNIPAQUE) 300 MG/ML solution 100 mL (100 mLs Intravenous Contrast Given 11/09/21 1515)    ED Course/ Medical Decision Making/ A&P                           Medical Decision Making Patient complains of abdominal pain and oily diarrhea  Amount and/or Complexity of Data Reviewed External Data Reviewed: notes.    Details: Notes from general surgery 515 reviewed Labs: ordered. Decision-making details documented in ED Course.    Details: [facility] evaluations ordered reviewed and interpreted patient has a slight elevation of glucose at [facility] and chemistry are otherwise unremarkable urine is negative urine pregnancy is negative Radiology: ordered and independent interpretation performed. Decision-making details documented in ED Course.    Details: ET scan of patient's abdomen is ordered reviewed and interpreted patient has no acute CT findings.  No noted postop complications  Risk Prescription drug management. Risk Details: Patient reports feeling better after pain medicine and nausea medicine.  She feels well enough to go home states that she has Zofran at home that she can take he is advised to continue diet Zofran clear liquid diet  for 12 hours and then resume her normal diet.  Patient request medication for discomfort she is given a prescription for 12 hydrocodone           Final Clinical Impression(s) / ED Diagnoses Final diagnoses:  Generalized abdominal pain  Vomiting, unspecified vomiting type, unspecified whether nausea present    Rx / DC Orders ED Discharge Orders          Ordered    HYDROcodone-acetaminophen (NORCO/VICODIN) 5-325 MG tablet  Every 4 hours PRN        11/09/21 1726           An After Visit Summary was printed and given to the patient.    Fransico Meadow, PA-C 11/10/21 Daniel, DO 11/10/21 1247

## 2021-11-11 ENCOUNTER — Telehealth: Payer: Self-pay

## 2021-11-11 NOTE — Telephone Encounter (Signed)
Transition Care Management Unsuccessful Follow-up Telephone Call  Date of discharge and from where:  11/09/2021-[place]  Attempts:  1st Attempt  Reason for unsuccessful TCM follow-up call:  Left voice message

## 2021-11-12 NOTE — Telephone Encounter (Signed)
Transition Care Management Unsuccessful Follow-up Telephone Call  Date of discharge and from where:  11/09/2021-[place]   Attempts:  2nd Attempt  Reason for unsuccessful TCM follow-up call:  Left voice message

## 2021-11-13 NOTE — Telephone Encounter (Signed)
Transition Care Management Unsuccessful Follow-up Telephone Call  Date of discharge and from where:  11/09/2021-[place]   Attempts:  3rd Attempt  Reason for unsuccessful TCM follow-up call:  Unable to reach patient

## 2021-11-14 ENCOUNTER — Ambulatory Visit: Payer: Medicaid Other | Admitting: Internal Medicine

## 2021-11-14 ENCOUNTER — Other Ambulatory Visit: Payer: Self-pay | Admitting: Internal Medicine

## 2021-11-14 DIAGNOSIS — M069 Rheumatoid arthritis, unspecified: Secondary | ICD-10-CM

## 2021-11-14 LAB — HM DIABETES EYE EXAM

## 2021-11-21 ENCOUNTER — Encounter: Payer: Self-pay | Admitting: *Deleted

## 2021-11-28 ENCOUNTER — Ambulatory Visit: Payer: Medicaid Other | Admitting: Internal Medicine

## 2021-12-17 ENCOUNTER — Encounter: Payer: Self-pay | Admitting: Physical Medicine & Rehabilitation

## 2021-12-17 ENCOUNTER — Encounter: Payer: Medicaid Other | Attending: Physical Medicine & Rehabilitation | Admitting: Physical Medicine & Rehabilitation

## 2021-12-17 VITALS — BP 125/86 | HR 78 | Ht 64.0 in | Wt 234.0 lb

## 2021-12-17 DIAGNOSIS — R5381 Other malaise: Secondary | ICD-10-CM | POA: Insufficient documentation

## 2021-12-17 DIAGNOSIS — M25561 Pain in right knee: Secondary | ICD-10-CM | POA: Diagnosis not present

## 2021-12-17 MED ORDER — SAVELLA 12.5 MG PO TABS: 12.5000 mg | ORAL_TABLET | Freq: Two times a day (BID) | ORAL | 1 refills | Status: DC

## 2021-12-17 NOTE — Progress Notes (Signed)
Subjective:    Patient ID: Elizabeth Bautista, female    DOB: 1975/11/21, 46 y.o.   MRN: [id number]  HPI 46 year old female with pain all over she has no recent falls or trauma.  She has a history of fibromyalgia syndrome.  In addition she has had some right knee pain no trauma. Patient had a ED visit 09/21/2021 for right leg and thigh pain which started after she heard a pop.  No trauma history at that time.  X-rays of the right femur right knee right tib-fib were negative for acute process. She does have an appointment with orthopedics for this, Dr. Romeo Apple on August 3. We discussed her medications she is on numerous medications for depression but states that several have been stopped recently she is no longer on Lexapro or Remeron or trazodone.  She continues on duloxetine which was ordered for her fibromyalgia as well as Seroquel.  Pain Inventory Average Pain 10 Pain Right Now 8 My pain is constant, sharp, burning, stabbing, tingling, aching, and ripping  In the last 24 hours, has pain interfered with the following? General activity 10 Relation with others 10 Enjoyment of life 10 What TIME of day is your pain at its worst? morning , daytime, evening, and night Sleep (in general) Poor  Pain is worse with: walking, bending, sitting, inactivity, standing, and some activites Pain improves with: rest Relief from Meds:  nothing     Family History  Problem Relation Age of Onset   High blood pressure Mother    Rheum arthritis Mother    Diabetes Father    Diabetes Sister    Heart Problems Sister    Diabetes Brother    Diabetes Paternal Grandmother    Diabetes Other        father's side "everybody died from Diabetes"   High blood pressure Other        mother's side, multiple siblings with this    Heart attack Other        family member on mother's side    Diabetes Paternal Aunt    Seizures Cousin        not sibings to the other cousins with seizures   Breast cancer Cousin     Seizures Cousin        not sibings to the other cousins with seizures   Seizures Cousin        not sibings to the other cousins with seizures   Cervical cancer Maternal Aunt    Breast cancer Cousin    Dementia Maternal Aunt    Asthma Maternal Aunt    Heart Problems Maternal Grandmother    Diabetes Brother    Asthma Daughter    Angioedema Daughter    Asthma Son    Social History   Socioeconomic History   Marital status: Single    Spouse name: Not on file   Number of children: 5   Years of education: Not on file   Highest education level: 9th grade  Occupational History   Not on file  Tobacco Use   Smoking status: Former    Years: 10.00    Types: Cigarettes    Quit date: 06/02/2013    Years since quitting: 8.5    Passive exposure: Never   Smokeless tobacco: Never   Tobacco comments:    during the 10 years of smoking, smoked 2-3 cigarettes/day  Vaping Use   Vaping Use: Never used  Substance and Sexual Activity   Alcohol use: Not Currently  Comment: occ   Drug use: Not Currently   Sexual activity: Not Currently    Birth control/protection: Surgical    Comment: tubal  Other Topics Concern   Not on file  Social History Narrative   Divorced.Lives with 3 kids.Originally from [place].Came from [place] ,[place] 7 months ago.      02/27/2020   Right handed   Caffeine: none    Social Determinants of Health   Financial Resource Strain: High Risk (08/08/2021)   Overall Financial Resource Strain (CARDIA)    Difficulty of Paying Living Expenses: Very hard  Food Insecurity: Food Insecurity Present (08/08/2021)   Hunger Vital Sign    Worried About Running Out of Food in the Last Year: Sometimes true    Ran Out of Food in the Last Year: Often true  Transportation Needs: No Transportation Needs (08/08/2021)   PRAPARE - Administrator, Civil Service (Medical): No    Lack of Transportation (Non-Medical): No  Physical Activity: Insufficiently Active (08/08/2021)   Exercise Vital  Sign    Days of Exercise per Week: 1 day    Minutes of Exercise per Session: 10 min  Stress: Stress Concern Present (08/08/2021)   [employer] of Occupational Health - Occupational Stress Questionnaire    Feeling of Stress : Very much  Social Connections: Socially Isolated (08/08/2021)   Social Connection and Isolation Panel [NHANES]    Frequency of Communication with Friends and Family: Once a week    Frequency of Social Gatherings with Friends and Family: Never    Attends Religious Services: Never    Database administrator or Organizations: No    Attends Engineer, structural: Never    Marital Status: Divorced   Past Surgical History:  Procedure Laterality Date   APPENDECTOMY     CESAREAN SECTION     x3   GASTRIC BYPASS     Past Medical History:  Diagnosis Date   Acute respiratory failure with hypoxia (HCC)    Anemia    Asthma    COVID-19 virus infection 05/16/2020   04/27/20 dx covid-19, not hospitalized   Diabetes (HCC)    on Metformin   Eczema    Edema    Heart problem    "something with the arteries on the left side of the heart"; upcoming appt with cardiology for evaluation   HTN (hypertension)    Rheumatoid arthritis (HCC)    on Plaquenil    Seizures (HCC)    last seizure 2019   Sleep apnea    Sleep apnea    Ht 5\' 4"  (1.626 m)   BMI 43.77 kg/m   Opioid Risk Score:   Fall Risk Score:  `1  Depression screen [facility] 2/9     12/17/2021   10:26 AM 09/17/2021   10:49 AM 08/27/2021    1:07 PM 08/08/2021    2:46 PM 08/02/2021    9:45 AM 07/11/2021   12:58 PM 05/30/2021    8:07 AM  Depression screen PHQ 2/9  Decreased Interest 1 0 3 3 3 2  0  Down, Depressed, Hopeless 1 0 3 3 3 2 3   PHQ - 2 Score 2 0 6 6 6 4 3   Altered sleeping   3 3 3  3   Tired, decreased energy   3 3 3  3   Change in appetite   3 2 1  1   Feeling bad or failure about yourself    3 3 3  3   Trouble concentrating  3 3 1  3   Moving slowly or fidgety/restless   2 2 0  3  Suicidal thoughts    0 0 0  0  PHQ-9 Score   23 22 17  19   Difficult doing work/chores   Extremely dIfficult  Very difficult  Very difficult    Review of Systems  Musculoskeletal:  Positive for arthralgias, back pain and gait problem.       Pain all over the body in muscles & joints  All other systems reviewed and are negative.     Objective:   Physical Exam  There is no evidence of knee effusion bilaterally no erythema.  There is diffuse knee pain with palpation anteriorly posteriorly medially and laterally no focal pain areas patient has pain with both flexion and extension.  She is able to ambulate without assistive device. Her back has tenderness palpation in the cervical thoracic and lumbar paraspinal area as well as the pelvic parascapular area. Mood and affect flat poor eye contact no lability or agitation. Patient is full range of motion bilateral upper and lower extremity no evidence of joint swelling or joint deformity in hands or wrist.    Assessment & Plan:   1.  Fibromyalgia syndrome diffuse body pain has been trialed on Lyrica is on 200 mg 3 times daily which is maximal dose.  In addition she is on Cymbalta 60 mg a day.  She is not feeling any benefit from this regimen.  We will discontinue duloxetine and start Savella 12.5 twice daily may need to bump it up every month to a total dose of 25-50 twice daily.  Patient will follow-up with Dr. 

## 2021-12-20 ENCOUNTER — Ambulatory Visit: Payer: Medicaid Other | Admitting: Internal Medicine

## 2021-12-20 ENCOUNTER — Ambulatory Visit (HOSPITAL_COMMUNITY)
Admission: RE | Admit: 2021-12-20 | Discharge: 2021-12-20 | Disposition: A | Discharge status: Home / Self Care | Payer: Medicaid Other | Source: Ambulatory Visit | Attending: Physical Medicine & Rehabilitation | Admitting: Physical Medicine & Rehabilitation

## 2021-12-20 ENCOUNTER — Encounter: Payer: Self-pay | Admitting: Internal Medicine

## 2021-12-20 DIAGNOSIS — M25561 Pain in right knee: Secondary | ICD-10-CM | POA: Diagnosis present

## 2021-12-26 DIAGNOSIS — K219 Gastro-esophageal reflux disease without esophagitis: Secondary | ICD-10-CM | POA: Insufficient documentation

## 2021-12-30 ENCOUNTER — Telehealth: Payer: Self-pay

## 2021-12-30 NOTE — Telephone Encounter (Signed)
Patient called stating she is in severe pain with her knee and her xray results are back but she don't understand it. She would like a phone call back about her results and what to do next.

## 2021-12-31 ENCOUNTER — Telehealth: Payer: Self-pay | Admitting: Internal Medicine

## 2021-12-31 NOTE — Telephone Encounter (Signed)
Patient has been notified

## 2021-12-31 NOTE — Progress Notes (Deleted)
Office Visit Note  Patient: Elizabeth Bautista             Date of Birth: September 02, 1975           MRN: [id number]             PCP: Anabel Halon, MD Referring: Anabel Halon, MD Visit Date: 01/09/2022   Subjective:  No chief complaint on file.   History of Present Illness: Elizabeth Bautista is a 46 y.o. female here for follow up for seropositive RA.   Previous HPI 11/06/2021 Elizabeth Bautista is a 46 y.o. 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 She was last seen almost a year ago with discussion for considering addition of Enbrel due to continued pain and inflammation. This was initially delayed due to anticipating upcoming bariatric surgery. She saw Dr. Wynn Banker for ongoing pain in multiple areas and felt to represent FMS and she started Lyrica with partial improvement in pain. She was hospitalized in February with severe asthma exacerbation including ICU stay. She had the surgery on 5/15 without major complication. She is off her medications since the surgery and has been struggling some with maintaining hydration status.   Previous HPI 12/25/2020 Elizabeth Bautista is a 46 y.o. 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 She does not feel the medication has controlled symptoms well she has persistent stiffness in her hands and knees in the mornings and knee pain persists throughout the day. Her wrist has decreased in swelling. Her left shoulder is painful with decreased range of movement limited by pain. She has had asthma exacerbations treated with prednisone but currently trying to avoid this due to upcoming bariatric surgery plans.   Previous HPI: 10/11/20 Elizabeth Bautista is a 46 y.o. female here for evaluation and management of rheumatoid arthritis. Symptoms started at least since 2017 with hand, wrist, and knee pain and swelling periodically and worsened over time. She did not seek medication evaluation for some time due to being  very busy with work and symptoms progressively increased. She was hospitalized for severe pain and swelling especially in the left wrist and knee and workup at that time was more extensive and revealed positive RA serology suspected as the cause. She saw rheumatology since 2020 and started treatment initial with prednisone that did not control symptoms well but caused a lot of weight gain. She has had chronic steroid exposure due to refractory asthma symptoms over the years. She also started hydroxychloroquine that apparently helped symptoms reasonably well initially but has worsened. She moved form [place] a year ago due to safety circumstances and has some family in the area and is continuing HCQ treatment but overall having a lot of joint pain. Currently feels swelling and heat in the joints multiple days per week and walks with either a cane or walker for her left knee pain and instability depending on how badly it is acting up. She was recently hospitalized for asthma exacerbation on 4/28 and discharged 5/2.   Labs reviewed 10/2018 RF ~30, CCP >250 ANA neg   Imaging reviewed Chest xray 08/2020 Cardiomegaly no airspace disease   Lumbar spine xray 08/2020 No acute findings   No Rheumatology ROS completed.   PMFS History:  Patient Active Problem List   Diagnosis Date Noted   Laryngopharyngeal reflux (LPR) 12/26/2021   History of gastric bypass 10/21/2021   Spinal stenosis of lumbar region with neurogenic claudication 08/27/2021  Fall 08/27/2021   Sinusitis 08/11/2021   Allergic rhinitis 08/07/2021   Hospital discharge follow-up 08/02/2021   (HFpEF) heart failure with preserved ejection fraction (HCC) 07/20/2021   Body mass index (BMI) 45.0-49.9, adult (HCC) 02/05/2021   Iron deficiency anemia due to chronic blood loss 10/17/2020   Tension-type headache, not intractable 10/17/2020   High risk medication use 10/11/2020   Acute hip pain, left 09/19/2020   Sciatica 09/11/2020    Dysmenorrhea 06/04/2020   Menorrhagia with irregular cycle 06/04/2020   Anxiety and depression 06/04/2020   Physical deconditioning 05/24/2020   Severe persistent asthma 03/08/2020   GERD (gastroesophageal reflux disease) 03/07/2020   Anxiety 03/07/2020   History of seizures 03/07/2020   Swelling of lower extremity 02/01/2020   Essential hypertension 02/01/2020   Vitamin D deficiency 02/01/2020   Type 2 diabetes mellitus with hyperglycemia, without long-term current use of insulin (HCC) 02/01/2020   Anemia 02/01/2020   Class 3 severe obesity with serious comorbidity and body mass index (BMI) of 45.0 to 49.9 in adult (HCC) 10/22/2019   OSA on CPAP 10/18/2019   Seropositive rheumatoid arthritis (HCC) 10/18/2019    Past Medical History:  Diagnosis Date   Acute respiratory failure with hypoxia (HCC)    Anemia    Asthma    COVID-19 virus infection 05/16/2020   04/27/20 dx covid-19, not hospitalized   Diabetes (HCC)    on Metformin   Eczema    Edema    Heart problem    "something with the arteries on the left side of the heart"; upcoming appt with cardiology for evaluation   HTN (hypertension)    Rheumatoid arthritis (HCC)    on Plaquenil    Seizures (HCC)    last seizure 2019   Sleep apnea    Sleep apnea     Family History  Problem Relation Age of Onset   High blood pressure Mother    Rheum arthritis Mother    Diabetes Father    Diabetes Sister    Heart Problems Sister    Diabetes Brother    Diabetes Paternal Grandmother    Diabetes Other        father's side "everybody died from Diabetes"   High blood pressure Other        mother's side, multiple siblings with this    Heart attack Other        family member on mother's side    Diabetes Paternal Aunt    Seizures Cousin        not sibings to the other cousins with seizures   Breast cancer Cousin    Seizures Cousin        not sibings to the other cousins with seizures   Seizures Cousin        not sibings to the  other cousins with seizures   Cervical cancer Maternal Aunt    Breast cancer Cousin    Dementia Maternal Aunt    Asthma Maternal Aunt    Heart Problems Maternal Grandmother    Diabetes Brother    Asthma Daughter    Angioedema Daughter    Asthma Son    Past Surgical History:  Procedure Laterality Date   APPENDECTOMY     CESAREAN SECTION     x3   GASTRIC BYPASS     Social History   Social History Narrative   Divorced.Lives with 3 kids.Originally from [place].Came from [place] ,[place] 7 months ago.      02/27/2020   Right handed  Caffeine: none     There is no immunization history on file for this patient.   Objective: Vital Signs: There were no vitals taken for this visit.   Physical Exam   Musculoskeletal Exam: ***  CDAI Exam: CDAI Score: -- Patient Global: --; Provider Global: -- Swollen: --; Tender: -- Joint Exam 01/09/2022   No joint exam has been documented for this visit   There is currently no information documented on the homunculus. Go to the Rheumatology activity and complete the homunculus joint exam.  Investigation: No additional findings.  Imaging: DG Knee 1-2 Views Right  Result Date: 12/22/2021 CLINICAL DATA:  Right knee pain for 2 weeks. History of torn right ACL 1 month ago. No prior surgery. EXAM: RIGHT KNEE - 1-2 VIEW COMPARISON:  Right knee radiographs 09/21/2021 FINDINGS: Mild medial compartment joint space narrowing. Minimal peripheral degenerative osteophytosis. Mild patella alta. Moderate joint effusion is increased from prior. No acute fracture or dislocation. IMPRESSION: Mild medial compartment osteoarthritis. Moderate joint effusion. Mild patella alta. Electronically Signed   By: Neita Garnet M.D.   On: 12/22/2021 09:36    Recent Labs: Lab Results  Component Value Date   WBC 5.1 11/09/2021   HGB 12.5 11/09/2021   PLT 207 11/09/2021   NA 134 (L) 11/09/2021   K 3.6 11/09/2021   CL 107 11/09/2021   CO2 21 (L) 11/09/2021   GLUCOSE 107  (H) 11/09/2021   BUN 7 11/09/2021   CREATININE 0.74 11/09/2021   BILITOT 0.9 11/09/2021   ALKPHOS 72 11/09/2021   AST 17 11/09/2021   ALT 21 11/09/2021   PROT 7.0 11/09/2021   ALBUMIN 3.7 11/09/2021   CALCIUM 8.4 (L) 11/09/2021   GFRAA 121 10/11/2020   QFTBGOLDPLUS NEGATIVE 10/11/2020    Speciality Comments: PLQ EYE EXAM 09/12/2021 [place] f/u 2-3 months  Procedures:  No procedures performed Allergies: Ferumoxytol, Phenytoin, Pecan nut (diagnostic), and Phenylbutazones   Assessment / Plan:     Visit Diagnoses: No diagnosis found.  ***  Orders: No orders of the defined types were placed in this encounter.  No orders of the defined types were placed in this encounter.    Follow-Up Instructions: No follow-ups on file.   Jairo Ben, RT  Note - This record has been created using Animal nutritionist.  Chart creation errors have been sought, but may not always  have been located. Such creation errors do not reflect on  the standard of medical care.

## 2021-12-31 NOTE — Telephone Encounter (Signed)
Patient called stating she is having severe pain in her right knee and is having difficulty walking and sleeping.  Patient states she had an x-ray which showed osteoarthritis and was told to contact Dr. Dimple Casey for an earlier appointment.  Patient is scheduled for 01/30/22.  Patient is requesting to be seen next week.  Please advise.

## 2022-01-02 ENCOUNTER — Encounter: Payer: Self-pay | Admitting: Orthopedic Surgery

## 2022-01-02 ENCOUNTER — Ambulatory Visit (INDEPENDENT_AMBULATORY_CARE_PROVIDER_SITE_OTHER): Payer: Medicaid Other | Admitting: Orthopedic Surgery

## 2022-01-02 VITALS — BP 118/79 | HR 98 | Ht 64.0 in | Wt 225.0 lb

## 2022-01-02 DIAGNOSIS — M5410 Radiculopathy, site unspecified: Secondary | ICD-10-CM

## 2022-01-02 DIAGNOSIS — M05761 Rheumatoid arthritis with rheumatoid factor of right knee without organ or systems involvement: Secondary | ICD-10-CM | POA: Diagnosis not present

## 2022-01-02 DIAGNOSIS — M25561 Pain in right knee: Secondary | ICD-10-CM

## 2022-01-02 DIAGNOSIS — G8929 Other chronic pain: Secondary | ICD-10-CM | POA: Diagnosis not present

## 2022-01-02 MED ORDER — PREDNISONE 10 MG PO TABS: 10.0000 mg | ORAL_TABLET | Freq: Three times a day (TID) | ORAL | 0 refills | Status: DC

## 2022-01-02 NOTE — Progress Notes (Signed)
Chief Complaint  Patient presents with   Knee Pain    Rt knee pain for 2 yrs getting progressively worse.     HPI: 46 year old female previously worked up for back pain with left-sided sciatica now presents with 2-year history of pain in the right knee and right leg she has a history of rheumatoid arthritis followed by rheumatology Dr. Dimple Casey seen neurosurgery Dr. Franky Macho for possible intervention regarding her back  She had an MRI last year that showed some nonsurgical foraminal stenosis degenerative disc disease please see report  She has severe pain in the right knee she presented to the ER x-rays showed mild OA.  She has warmth in the right knee most likely from the rheumatoid arthritis she denies any prior trauma  She also has pain in her back radiates down her right leg to the back of the right calf  She reports her pain to be 10 out of 10 she is having difficulty sleeping and at times cries as she did in the ER please see ER record  Past Medical History:  Diagnosis Date   Acute respiratory failure with hypoxia (HCC)    Anemia    Asthma    COVID-19 virus infection 05/16/2020   04/27/20 dx covid-19, not hospitalized   Diabetes (HCC)    on Metformin   Eczema    Edema    Heart problem    "something with the arteries on the left side of the heart"; upcoming appt with cardiology for evaluation   HTN (hypertension)    Rheumatoid arthritis (HCC)    on Plaquenil    Seizures (HCC)    last seizure 2019   Sleep apnea    Sleep apnea     BP 118/79   Pulse 98   Ht 5\' 4"  (1.626 m)   Wt 225 lb (102.1 kg)   BMI 38.62 kg/m    General appearance normal development grooming and hygiene no gross abnormalities  She has normal pulse and perfusion in the right lower extremity with no sensory loss  She is tender in the lateral side of her right thigh down to the right And she has warmth in the right knee but no erythema she has painful range of motion in extension and flexion up to 90  degrees  Her gait is requiring her to use a walker  Psychological: Awake alert and oriented x3 mood and affect normal  Skin no lacerations or ulcerations no nodularity no palpable masses, no erythema or nodularity   Imaging plain films show very little medial joint space narrowing no joint erosions no fracture or dislocation  The MRI again of her spine is notable for  L3-4: Mild disc bulge. Superimposed small left foraminal disc protrusion contacts the exiting left L3 nerve root as it courses of the left neural foramen (series 8, image 17). No spinal stenosis. Foramina remain patent.   L4-5: Mild disc bulge. Minimal facet spurring. No spinal stenosis. Mild bilateral L4 foraminal narrowing.   L5-S1: Disc desiccation. Broad base central disc protrusion closely approximates the descending S1 nerve roots without frank neural impingement or displacement (series 8, image 26). Minimal facet spurring. No canal or lateral recess stenosis. Mild bilateral L5 foraminal stenosis.   IMPRESSION: 1. Small left foraminal disc protrusion at L3-4, contacting and potentially irritating the exiting left L3 nerve root. Finding could contribute to left lower extremity symptoms. 2. Broad central disc protrusion at L5-S1, closely approximating the descending S1 nerve roots without frank neural impingement or  displacement. 3. Mild disc bulging with facet spurring at L4-5 and L5-S1 with resultant mild bilateral L4 and L5 foraminal stenosis.     Electronically Signed   By: Rise Mu M.D.   On: 11/07/2020 20:59  A/P  I think she probably has 2 problems  Problem #1 is the disc disease lumbar spine.  I have asked for Dr. Jackelyn Knife notes sounds like he did not think this needed surgery but no epidurals were done no therapy was done  Problem #2 is the right knee with warmth and effusion occasionally probably related to rheumatoid arthritis medication adjustment scheduled with the patient's  next visit advised to continue but MRI is also warranted to rule out any intra-articular pathology that would cause her knee pain   Meds ordered this encounter  Medications   predniSONE (DELTASONE) 10 MG tablet    Sig: Take 1 tablet (10 mg total) by mouth 3 (three) times daily.    Dispense:  42 tablet    Refill:  0

## 2022-01-02 NOTE — Patient Instructions (Addendum)
Use the walker   Keep your rheumatology appointment   Take your flexeril   Take your plaquenil   Take these medications  Meds ordered this encounter  Medications   predniSONE (DELTASONE) 10 MG tablet    Sig: Take 1 tablet (10 mg total) by mouth 3 (three) times daily.    Dispense:  42 tablet    Refill:  0

## 2022-01-02 NOTE — Patient Instructions (Signed)
While we are working on your approval for MRI please go ahead and call to schedule your appointment with [facility] within at least one (1) week.   Central Scheduling [phone]  

## 2022-01-07 ENCOUNTER — Ambulatory Visit: Payer: Medicaid Other | Admitting: Internal Medicine

## 2022-01-09 ENCOUNTER — Ambulatory Visit: Payer: Medicaid Other | Admitting: Internal Medicine

## 2022-01-09 DIAGNOSIS — M059 Rheumatoid arthritis with rheumatoid factor, unspecified: Secondary | ICD-10-CM

## 2022-01-09 DIAGNOSIS — Z79899 Other long term (current) drug therapy: Secondary | ICD-10-CM

## 2022-01-09 DIAGNOSIS — J4551 Severe persistent asthma with (acute) exacerbation: Secondary | ICD-10-CM

## 2022-01-10 ENCOUNTER — Telehealth: Payer: Self-pay | Admitting: Orthopedic Surgery

## 2022-01-10 ENCOUNTER — Ambulatory Visit (HOSPITAL_BASED_OUTPATIENT_CLINIC_OR_DEPARTMENT_OTHER)
Admission: RE | Admit: 2022-01-10 | Discharge: 2022-01-10 | Disposition: A | Discharge status: Home / Self Care | Payer: Medicaid Other | Source: Ambulatory Visit | Attending: Orthopedic Surgery | Admitting: Orthopedic Surgery

## 2022-01-10 DIAGNOSIS — M25561 Pain in right knee: Secondary | ICD-10-CM | POA: Diagnosis not present

## 2022-01-10 DIAGNOSIS — G8929 Other chronic pain: Secondary | ICD-10-CM | POA: Diagnosis present

## 2022-01-10 NOTE — Telephone Encounter (Signed)
Patient called to relay she has just had her MRI done this morning, 01/10/22, and requests to schedule her follow up appointment, which we have done for next available, 01/24/22, as well as adding to wait list. Patient is asking if there is a brace Dr Romeo Apple can recommend to support her knee in the meantime? Please advise.

## 2022-01-13 ENCOUNTER — Encounter: Payer: Self-pay | Admitting: Orthopedic Surgery

## 2022-01-13 NOTE — Telephone Encounter (Signed)
Called patient to offer Thursday, 01/16/22 in 9:40 or 10:20am, per Dr Mort Sawyers message. Reached voice mail, left message.

## 2022-01-13 NOTE — Telephone Encounter (Signed)
-----   Message from Vickki Hearing, MD sent at 01/13/2022 10:07 AM EDT ----- Schedule this patient for appt thurs @ 940 or 1020 in the same day slot   She does not have to wait until 8/25

## 2022-01-13 NOTE — Telephone Encounter (Signed)
Done

## 2022-01-14 ENCOUNTER — Other Ambulatory Visit: Payer: Self-pay | Admitting: Internal Medicine

## 2022-01-14 ENCOUNTER — Telehealth: Payer: Self-pay

## 2022-01-14 ENCOUNTER — Other Ambulatory Visit: Payer: Self-pay | Admitting: *Deleted

## 2022-01-14 DIAGNOSIS — F411 Generalized anxiety disorder: Secondary | ICD-10-CM

## 2022-01-14 DIAGNOSIS — F339 Major depressive disorder, recurrent, unspecified: Secondary | ICD-10-CM

## 2022-01-14 DIAGNOSIS — F32A Depression, unspecified: Secondary | ICD-10-CM

## 2022-01-14 DIAGNOSIS — F419 Anxiety disorder, unspecified: Secondary | ICD-10-CM

## 2022-01-14 NOTE — Telephone Encounter (Signed)
Urgent referral placed for [facility]

## 2022-01-14 NOTE — Telephone Encounter (Signed)
Patient called said the current mental health behavior [facility] in [place] no longer accepts patient insurance. Needs a new referral sent to [facility] in [place] will accept patient insurance. Needs appointment ASAP with [facility].  Please return patient call back [phone]

## 2022-01-14 NOTE — Progress Notes (Signed)
Office Visit Note  Patient: Elizabeth Bautista             Date of Birth: 12/28/1975           MRN: [id number]             PCP: Dorna Mai, MD Referring: [name], MD Visit Date: 01/15/2022   Subjective:  Follow-up (Right knee pain.)   History of Present Illness: Elizabeth Bautista is a 46 y.o. female here for follow up for seropositive RA on HCQ 400 mg daily. We had previously delayed addition of medication possible Enbrel due to surgery and more recently was recovering from medical issues. She saw Dr. Aline Brochure recently and right knee warmth and effusion present with MRI obtained appearing consistent with active inflammatory arthritis. She is having a lot of pain and stiffness in her right knee and exacerbated with walking and weight bearing.  Previous HPI 11/06/21  Elizabeth Bautista is a 46 y.o. 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 She was last seen almost a year ago with discussion for considering addition of Enbrel due to continued pain and inflammation. This was initially delayed due to anticipating upcoming bariatric surgery. She saw Dr. Letta Pate for ongoing pain in multiple areas and felt to represent FMS and she started Lyrica with partial improvement in pain. She was hospitalized in February with severe asthma exacerbation including ICU stay. She had the surgery on 5/15 without major complication. She is off her medications since the surgery and has been struggling some with maintaining hydration status.   Previous HPI 12/25/2020 Elizabeth Bautista is a 46 y.o. 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 She does not feel the medication has controlled symptoms well she has persistent stiffness in her hands and knees in the mornings and knee pain persists throughout the day. Her wrist has decreased in swelling. Her left shoulder is painful with decreased range of movement limited by pain. She has had asthma  exacerbations treated with prednisone but currently trying to avoid this due to upcoming bariatric surgery plans.   Previous HPI: 10/11/20 Elizabeth Bautista is a 46 y.o. female here for evaluation and management of rheumatoid arthritis. Symptoms started at least since 2017 with hand, wrist, and knee pain and swelling periodically and worsened over time. She did not seek medication evaluation for some time due to being very busy with work and symptoms progressively increased. She was hospitalized for severe pain and swelling especially in the left wrist and knee and workup at that time was more extensive and revealed positive RA serology suspected as the cause. She saw rheumatology since 2020 and started treatment initial with prednisone that did not control symptoms well but caused a lot of weight gain. She has had chronic steroid exposure due to refractory asthma symptoms over the years. She also started hydroxychloroquine that apparently helped symptoms reasonably well initially but has worsened. She moved form [place] a year ago due to safety circumstances and has some family in the area and is continuing HCQ treatment but overall having a lot of joint pain. Currently feels swelling and heat in the joints multiple days per week and walks with either a cane or walker for her left knee pain and instability depending on how badly it is acting up. She was recently hospitalized for asthma exacerbation on 4/28 and discharged 5/2.   Labs reviewed 10/2018 RF ~30, CCP >250 ANA neg  Imaging reviewed Chest xray 08/2020 Cardiomegaly no airspace disease   Lumbar spine xray 08/2020 No acute findings     Review of Systems  Constitutional:  Positive for fatigue.  HENT:  Negative for mouth sores and mouth dryness.   Eyes:  Negative for dryness.  Respiratory:  Positive for shortness of breath.   Cardiovascular:  Positive for chest pain. Negative for palpitations.  Gastrointestinal:  Negative for blood in stool,  constipation and diarrhea.  Endocrine: Negative for increased urination.  Genitourinary:  Negative for involuntary urination.  Musculoskeletal:  Positive for joint pain, gait problem, joint pain, joint swelling, myalgias, muscle weakness, morning stiffness, muscle tenderness and myalgias.  Skin:  Positive for rash and sensitivity to sunlight. Negative for color change and hair loss.  Allergic/Immunologic: Negative for susceptible to infections.  Neurological:  Positive for dizziness and headaches.  Hematological:  Negative for swollen glands.  Psychiatric/Behavioral:  Positive for depressed mood and sleep disturbance. The patient is nervous/anxious.     PMFS History:  Patient Active Problem List   Diagnosis Date Noted   Laryngopharyngeal reflux (LPR) 12/26/2021   History of gastric bypass 10/21/2021   Spinal stenosis of lumbar region with neurogenic claudication 08/27/2021   Fall 08/27/2021   Sinusitis 08/11/2021   Allergic rhinitis 08/07/2021   Hospital discharge follow-up 08/02/2021   (HFpEF) heart failure with preserved ejection fraction ([place]) 07/20/2021   Body mass index (BMI) 45.0-49.9, adult ([place]) 02/05/2021   Iron deficiency anemia due to chronic blood loss 10/17/2020   Tension-type headache, not intractable 10/17/2020   High risk medication use 10/11/2020   Acute hip pain, left 09/19/2020   Sciatica 09/11/2020   Dysmenorrhea 06/04/2020   Menorrhagia with irregular cycle 06/04/2020   Anxiety and depression 06/04/2020   Physical deconditioning 05/24/2020   Severe persistent asthma 03/08/2020   GERD (gastroesophageal reflux disease) 03/07/2020   Anxiety 03/07/2020   History of seizures 03/07/2020   Swelling of lower extremity 02/01/2020   Essential hypertension 02/01/2020   Vitamin D deficiency 02/01/2020   Type 2 diabetes mellitus with hyperglycemia, without long-term current use of insulin ([place]) 02/01/2020   Anemia 02/01/2020   Class 3 severe obesity with serious  comorbidity and body mass index (BMI) of 45.0 to 49.9 in adult ([place]) 10/22/2019   OSA on CPAP 10/18/2019   Seropositive rheumatoid arthritis ([place]) 10/18/2019    Past Medical History:  Diagnosis Date   Acute respiratory failure with hypoxia ([place])    Anemia    Asthma    COVID-19 virus infection 05/16/2020   04/27/20 dx covid-19, not hospitalized   Diabetes ([place])    on Metformin   Eczema    Edema    Heart problem    "something with the arteries on the left side of the heart"; upcoming appt with cardiology for evaluation   HTN (hypertension)    Rheumatoid arthritis ([place])    on Plaquenil    Seizures ([place])    last seizure 2019   Sleep apnea    Sleep apnea     Family History  Problem Relation Age of Onset   High blood pressure Mother    Rheum arthritis Mother    Diabetes Father    Diabetes Sister    Heart Problems Sister    Diabetes Brother    Diabetes Paternal Grandmother    Diabetes Other        father's side "everybody died from Diabetes"   High blood pressure Other        mother's  side, multiple siblings with this    Heart attack Other        family member on mother's side    Diabetes Paternal Aunt    Seizures Cousin        not sibings to the other cousins with seizures   Breast cancer Cousin    Seizures Cousin        not sibings to the other cousins with seizures   Seizures Cousin        not sibings to the other cousins with seizures   Cervical cancer Maternal Aunt    Breast cancer Cousin    Dementia Maternal Aunt    Asthma Maternal Aunt    Heart Problems Maternal Grandmother    Diabetes Brother    Asthma Daughter    Angioedema Daughter    Asthma Son    Past Surgical History:  Procedure Laterality Date   APPENDECTOMY     CESAREAN SECTION     x3   GASTRIC BYPASS     Social History   Social History Narrative   Divorced.Lives with 3 kids.Originally from [place].Came from [place] 7 months ago.      02/27/2020   Right handed   Caffeine: none      There is no immunization history on file for this patient.   Objective: Vital Signs: BP 118/81 (BP Location: Left Arm, Patient Position: Sitting, Cuff Size: Large)   Pulse 89   Resp 14   Ht 5' 4"  (1.626 m)   Wt 220 lb 9.6 oz (100.1 kg)   LMP 01/05/2022 (Exact Date)   BMI 37.87 kg/m    Physical Exam Cardiovascular:     Rate and Rhythm: Normal rate and regular rhythm.  Pulmonary:     Effort: Pulmonary effort is normal.     Breath sounds: Normal breath sounds.  Musculoskeletal:     Right lower leg: No edema.     Left lower leg: No edema.  Skin:    General: Skin is warm and dry.     Findings: No rash.  Neurological:     Mental Status: She is alert.  Psychiatric:        Mood and Affect: Mood normal.      Musculoskeletal Exam:  Shoulders full ROM no tenderness or swelling Elbows full ROM no tenderness or swelling Wrists full ROM no tenderness or swelling Fingers full ROM no tenderness or swelling No paraspinal tenderness to palpation over upper and lower back Hip normal internal and external rotation without pain, no tenderness to lateral hip palpation Right knee swelling and mild warmth, pain with active and passive ROM, left knee mild tenderness without palpable swelling   CDAI Exam: CDAI Score: 12  Patient Global: 50 mm; Provider Global: 40 mm Swollen: 1 ; Tender: 2  Joint Exam 01/15/2022      Right  Left  Knee  Swollen Tender   Tender     Investigation: No additional findings.  Imaging: MR Knee Right Wo Contrast  Result Date: 01/11/2022 CLINICAL DATA:  Chronic knee pain for 2 years, worsening over the last 2 months. Patient fell a few months ago. No previous relevant surgery. EXAM: MRI OF THE RIGHT KNEE WITHOUT CONTRAST TECHNIQUE: Multiplanar, multisequence MR imaging of the knee was performed. No intravenous contrast was administered. COMPARISON:  Radiographs 12/20/2021. FINDINGS: MENISCI Medial meniscus:  Intact with normal morphology. Lateral meniscus:   Intact with normal morphology. LIGAMENTS Cruciates:  Intact. Collaterals: Intact. There is a small amount of fluid  within the pes anserine bursa. CARTILAGE Patellofemoral: Mild patellar chondral thinning without focal defect. Medial: Mild chondral thinning without focal defect or subchondral signal abnormality. Lateral:  Preserved. MISCELLANEOUS Joint: Moderate-sized joint effusion appears complex, suspicious for synovitis. Popliteal Fossa: The popliteus muscle and tendon are intact. Small Baker's cyst. Extensor Mechanism:  Intact. Bones:  No acute or significant extra-articular osseous findings. Other: Mild generalized periarticular soft tissue edema. IMPRESSION: 1. Complex joint effusion with small Baker's cyst and periarticular soft tissue edema suspicious for synovitis and underlying inflammatory arthropathy. Correlate clinically. 2. Mild chondral thinning in the patellofemoral and medial compartments without erosive changes or acute osseous abnormalities. 3. Intact menisci and cruciate ligaments. Electronically Signed   By: Richardean Sale M.D.   On: 01/11/2022 10:56    Recent Labs: Lab Results  Component Value Date   WBC 7.6 01/15/2022   HGB 11.1 (L) 01/15/2022   PLT 289 01/15/2022   NA 139 01/15/2022   K 3.9 01/15/2022   CL 103 01/15/2022   CO2 26 01/15/2022   GLUCOSE 79 01/15/2022   BUN 5 (L) 01/15/2022   CREATININE 0.45 (L) 01/15/2022   BILITOT 0.5 01/15/2022   ALKPHOS 72 11/09/2021   AST 12 01/15/2022   ALT 8 01/15/2022   PROT 6.1 01/15/2022   ALBUMIN 3.7 11/09/2021   CALCIUM 8.9 01/15/2022   GFRAA 121 10/11/2020   QFTBGOLDPLUS NEGATIVE 01/15/2022    Speciality Comments: PLQ EYE EXAM 09/12/2021 [place] f/u 2-3 months  Procedures:  Large Joint Inj: R knee on 01/15/2022 12:20 PM Indications: pain, joint swelling and diagnostic evaluation Details: 22 G 1.5 in needle, lateral approach Medications: 4 mL lidocaine 1 % Aspirate: 3 mL yellow and clear; sent for lab analysis Outcome:  tolerated well, no immediate complications Procedure, treatment alternatives, risks and benefits explained, specific risks discussed. Consent was given by the patient. Immediately prior to procedure a time out was called to verify the correct patient, procedure, equipment, support staff and site/side marked as required. Patient was prepped and draped in the usual sterile fashion.     Allergies: Ferumoxytol, Phenytoin, Pecan nut (diagnostic), and Phenylbutazones   Assessment / Plan:     Visit Diagnoses: Seropositive rheumatoid arthritis ([place]) - Plan: Sedimentation rate, C-reactive protein  Moderately active disease clinically checking ESR and CRP for disease activity monitoring. On HCQ 400 mg daily. Plan to start Enbrel most likely for biologic DMARD. Not a good candidate for methotrexate due to baseline LFT abnormalities coming and going on recent metabolic panels.  High risk medication use - Plan: CBC with Differential/Platelet, COMPLETE METABOLIC PANEL WITH GFR, QuantiFERON-TB Gold Plus  Anticipating new medications checking CBC and CMP also quantiferon for screening, negative hepatitis screening reviewed previously. Most likely Enbrel reviewed risk and side effects including infections, malignancy, injection site reactions, and required lab monitoring.  Acute pain of right knee - Plan: Synovial Fluid Analysis, Complete, Anaerobic and Aerobic Culture, Large Joint Inj: R knee  Right knee aspiration for cell count and crystal analysis and culture. Inflammation  is much more compared to other joints at this time so excluding alternative process contributing.  Orders: Orders Placed This Encounter  Procedures   Large Joint Inj: R knee   Anaerobic and Aerobic Culture   Sedimentation rate   C-reactive protein   CBC with Differential/Platelet   COMPLETE METABOLIC PANEL WITH GFR   QuantiFERON-TB Gold Plus   Synovial Fluid Analysis, Complete   No orders of the defined types were placed in  this encounter.  Follow-Up Instructions: No follow-ups on file.   Collier Salina, MD  Note - This record has been created using [employer].  Chart creation errors have been sought, but may not always  have been located. Such creation errors do not reflect on  the standard of medical care.

## 2022-01-15 ENCOUNTER — Encounter: Payer: Self-pay | Admitting: Internal Medicine

## 2022-01-15 ENCOUNTER — Ambulatory Visit: Payer: Medicaid Other | Attending: Internal Medicine | Admitting: Internal Medicine

## 2022-01-15 ENCOUNTER — Ambulatory Visit: Payer: Medicaid Other | Admitting: Internal Medicine

## 2022-01-15 VITALS — BP 118/81 | HR 89 | Resp 14 | Ht 64.0 in | Wt 220.6 lb

## 2022-01-15 DIAGNOSIS — Z79899 Other long term (current) drug therapy: Secondary | ICD-10-CM | POA: Insufficient documentation

## 2022-01-15 DIAGNOSIS — M25561 Pain in right knee: Secondary | ICD-10-CM | POA: Diagnosis not present

## 2022-01-15 DIAGNOSIS — M059 Rheumatoid arthritis with rheumatoid factor, unspecified: Secondary | ICD-10-CM | POA: Insufficient documentation

## 2022-01-15 NOTE — Progress Notes (Signed)
I spoke with Elizabeth Bautista about her fluid analysis looks consistent with rheumatoid arthritis effusion. There are no crystals and appears to be no bacteria but culture will follow up to make sure.  I think we can proceed with increasing her treatment regimen, if needing pain relief in the short term could try local steroid injection either with orthopedist or here.

## 2022-01-16 ENCOUNTER — Ambulatory Visit (INDEPENDENT_AMBULATORY_CARE_PROVIDER_SITE_OTHER): Payer: Medicaid Other | Admitting: Orthopedic Surgery

## 2022-01-16 ENCOUNTER — Telehealth: Payer: Self-pay | Admitting: Radiology

## 2022-01-16 ENCOUNTER — Encounter: Payer: Self-pay | Admitting: Orthopedic Surgery

## 2022-01-16 DIAGNOSIS — M05761 Rheumatoid arthritis with rheumatoid factor of right knee without organ or systems involvement: Secondary | ICD-10-CM | POA: Diagnosis not present

## 2022-01-16 DIAGNOSIS — G8929 Other chronic pain: Secondary | ICD-10-CM

## 2022-01-16 DIAGNOSIS — M659 Synovitis and tenosynovitis, unspecified: Secondary | ICD-10-CM

## 2022-01-16 DIAGNOSIS — M25361 Other instability, right knee: Secondary | ICD-10-CM | POA: Diagnosis not present

## 2022-01-16 DIAGNOSIS — M65961 Unspecified synovitis and tenosynovitis, right lower leg: Secondary | ICD-10-CM

## 2022-01-16 DIAGNOSIS — M25561 Pain in right knee: Secondary | ICD-10-CM | POA: Diagnosis not present

## 2022-01-16 MED ORDER — METHYLPREDNISOLONE ACETATE 40 MG/ML IJ SUSP
40.0000 mg | Freq: Once | INTRAMUSCULAR | Status: AC
  Administered 2022-01-16: 40 mg via INTRA_ARTICULAR

## 2022-01-16 NOTE — Patient Instructions (Signed)
See rheumatology for follow up with your knee and Dr Hilda Lias if needed for f/u with your back

## 2022-01-16 NOTE — Telephone Encounter (Signed)
-----   Message from Vickki Hearing, MD sent at 01/16/2022 10:50 AM EDT ----- Wants a brace   It will need to b custom if ins approves otherwise we ll do OTC

## 2022-01-16 NOTE — Progress Notes (Addendum)
FOLLOW UP   Encounter Diagnoses  Name Primary?   Chronic pain of right knee Yes   Synovitis of right knee    Rheumatoid arthritis involving right knee with positive rheumatoid factor (HCC)    Instability of right knee joint     Chief Complaint  Patient presents with   Results    Review MRI knee right / patient states she has seen rheumatology and has had labs recently from that.    Status: Final result    Visible to patient: Yes (seen)    Next appt: 01/24/2022 at 08:00 AM in Family Medicine Andrey Campanile, Demetrios Isaacs, MD)    Dx: Acute pain of right knee    1 Result Note     Component Ref Range & Units 1 d ago  Site  RIGHT KNEE   Color, Synovial STRAW/YELLOW YELLOW   Appearance-Synovial CLEAR/HAZY CLOUDY Abnormal    WBC, Synovial <150 cells/uL 7,259 High    Neutrophil, Synovial 0 - 24 % 88 High    Lymphocytes-Synovial Fld 0 - 74 % 12   Monocyte/Macrophage 0 - 69 % 0   Eosinophils-Synovial 0 - 2 % 0   Basophils, % 0 % 0   Synoviocytes, % 0 - 15 % 0   Crystals, Fluid NONE SEEN /HPF   Comment: No crystals found     Component Ref Range & Units 1 d ago (01/15/22) 2 mo ago (11/06/21) 1 yr ago (12/25/20) 1 yr ago (10/11/20)  CRP <8.0 mg/L 20.7 High   13.3 High   21.8 High   14.5 High    Resulting Agency  [facility] [place] [facility] [place] [facility] [place] [facility]    0 Result Notes       Component Ref Range & Units 1 d ago 2 mo ago 1 yr ago  Sed Rate 0 - 20 mm/h 33 High   11  17   Resulting Agency  [facility] [place] [facility] [place] [facility]       After review of the MRI it appears the patient only has synovitis I do not see any surgical lesions such as meniscal or ligament damage.  Large effusion is noted.  I did go ahead as I agree with Dr. Dimple Casey that an injection will help  Procedure note right knee injection   verbal consent was obtained to inject right knee joint  Timeout was completed to confirm the site of  injection  The medications used were depomedrol 40 mg and 1% lidocaine 3 cc Anesthesia was provided by ethyl chloride and the skin was prepped with alcohol.  After cleaning the skin with alcohol a 20-gauge needle was used to inject the right knee joint. There were no complications. A sterile bandage was applied.   Follow-up with Dr. Dimple Casey regarding the medication adjustment for rheumatoid arthritis  Encounter Diagnoses  Name Primary?   Chronic pain of right knee Yes   Synovitis of right knee    Rheumatoid arthritis involving right knee with positive rheumatoid factor (HCC)    Instability of right knee joint    Addendum  The patient relates that her knee is feeling wobbly when she is trying to walk especially without her walker  Reevaluation of the collateral ligaments indicate functional instability medial to lateral AP stability feels okay  Recommend CUSTOM KNEE BRACE patient has Instability with functional testing, Quad to calf ratio mismatch prevents of the shelf bracing   This statement needs to be in your notes for insurance to  cover

## 2022-01-17 ENCOUNTER — Telehealth: Payer: Medicaid Other | Admitting: Pharmacist

## 2022-01-17 NOTE — Telephone Encounter (Addendum)
Please start Enbrel Mini BIV.  Dose: 50mg  SQ weekly  Dx: M05.9 (seropositive RA)  Patient has Medicaid - will need submitted on [place] [employer]  , PharmD, MPH, BCPS, CPP Clinical Pharmacist (Rheumatology and Pulmonology)  ----- Message from Chesley Mires, MD sent at 01/17/2022  9:42 AM EDT ----- Regarding: Enbrel I would like to start Elizabeth Bautista on Enbrel for seropositive RA still active on hydroxychloroquine. She is not a good candidate for methotrexate due to intermittent liver enzyme elevations and hepatic steatosis on imaging. Checked labs again this week look okay. Thanks.

## 2022-01-18 LAB — CBC WITH DIFFERENTIAL/PLATELET
Absolute Monocytes: 502 cells/uL (ref 200–950)
Basophils Absolute: 38 cells/uL (ref 0–200)
Basophils Relative: 0.5 %
Eosinophils Absolute: 213 cells/uL (ref 15–500)
Eosinophils Relative: 2.8 %
HCT: 35.6 % (ref 35.0–45.0)
Hemoglobin: 11.1 g/dL — ABNORMAL LOW (ref 11.7–15.5)
Lymphs Abs: 1277 cells/uL (ref 850–3900)
MCH: 24.6 pg — ABNORMAL LOW (ref 27.0–33.0)
MCHC: 31.2 g/dL — ABNORMAL LOW (ref 32.0–36.0)
MCV: 78.9 fL — ABNORMAL LOW (ref 80.0–100.0)
MPV: 10.4 fL (ref 7.5–12.5)
Monocytes Relative: 6.6 %
Neutro Abs: 5571 cells/uL (ref 1500–7800)
Neutrophils Relative %: 73.3 %
Platelets: 289 10*3/uL (ref 140–400)
RBC: 4.51 10*6/uL (ref 3.80–5.10)
RDW: 17.4 % — ABNORMAL HIGH (ref 11.0–15.0)
Total Lymphocyte: 16.8 %
WBC: 7.6 10*3/uL (ref 3.8–10.8)

## 2022-01-18 LAB — COMPLETE METABOLIC PANEL WITH GFR
AG Ratio: 1.3 (calc) (ref 1.0–2.5)
ALT: 8 U/L (ref 6–29)
AST: 12 U/L (ref 10–35)
Albumin: 3.4 g/dL — ABNORMAL LOW (ref 3.6–5.1)
Alkaline phosphatase (APISO): 91 U/L (ref 31–125)
BUN/Creatinine Ratio: 11 (calc) (ref 6–22)
BUN: 5 mg/dL — ABNORMAL LOW (ref 7–25)
CO2: 26 mmol/L (ref 20–32)
Calcium: 8.9 mg/dL (ref 8.6–10.2)
Chloride: 103 mmol/L (ref 98–110)
Creat: 0.45 mg/dL — ABNORMAL LOW (ref 0.50–0.99)
Globulin: 2.7 g/dL (calc) (ref 1.9–3.7)
Glucose, Bld: 79 mg/dL (ref 65–99)
Potassium: 3.9 mmol/L (ref 3.5–5.3)
Sodium: 139 mmol/L (ref 135–146)
Total Bilirubin: 0.5 mg/dL (ref 0.2–1.2)
Total Protein: 6.1 g/dL (ref 6.1–8.1)
eGFR: 119 mL/min/{1.73_m2} (ref >=60)

## 2022-01-18 LAB — QUANTIFERON-TB GOLD PLUS
Mitogen-NIL: 3.05 IU/mL
NIL: 0.04 IU/mL
QuantiFERON-TB Gold Plus: NEGATIVE
TB1-NIL: 0.07 IU/mL
TB2-NIL: 0 IU/mL

## 2022-01-18 LAB — SEDIMENTATION RATE: Sed Rate: 33 mm/h — ABNORMAL HIGH (ref 0–20)

## 2022-01-18 LAB — C-REACTIVE PROTEIN: CRP: 20.7 mg/L — ABNORMAL HIGH (ref <8.0)

## 2022-01-20 ENCOUNTER — Other Ambulatory Visit (HOSPITAL_COMMUNITY): Payer: Self-pay

## 2022-01-20 LAB — SYNOVIAL FLUID ANALYSIS, COMPLETE
Basophils, %: 0 %
Eosinophils-Synovial: 0 % (ref 0–2)
Lymphocytes-Synovial Fld: 12 % (ref 0–74)
Monocyte/Macrophage: 0 % (ref 0–69)
Neutrophil, Synovial: 88 % — ABNORMAL HIGH (ref 0–24)
Synoviocytes, %: 0 % (ref 0–15)
WBC, Synovial: 7259 cells/uL — ABNORMAL HIGH (ref <150)

## 2022-01-20 LAB — ANAEROBIC AND AEROBIC CULTURE
AER RESULT:: NO GROWTH
MICRO NUMBER:: 13787519
MICRO NUMBER:: 13787520
SPECIMEN QUALITY:: ADEQUATE
SPECIMEN QUALITY:: ADEQUATE

## 2022-01-20 NOTE — Telephone Encounter (Signed)
Patient Advocate Encounter   Prior Authorization for ENBREL has been approved.     PA# [id number] W Effective dates: 8.21.23 through 8.21.24  Test billing results return a copay of $4. Pt is NOT locked in to a specific pharmacy.

## 2022-01-20 NOTE — Telephone Encounter (Signed)
Patient Advocate Encounter  Prior Authorization for ENBREL has been approved.    PA# [id number] W Effective dates: 8.21.23 through 8.21.24  Buck Mcaffee B. CPhT P: [phone] F: [phone]

## 2022-01-21 ENCOUNTER — Ambulatory Visit (HOSPITAL_COMMUNITY): Payer: Medicaid Other | Admitting: Physical Therapy

## 2022-01-21 NOTE — Telephone Encounter (Signed)
Patient scheduled for Enbrel new start on 01/28/22. Will use sample  Chesley Mires, PharmD, MPH, BCPS, CPP Clinical Pharmacist (Rheumatology and Pulmonology)

## 2022-01-24 ENCOUNTER — Ambulatory Visit: Payer: Medicaid Other | Admitting: Orthopedic Surgery

## 2022-01-24 ENCOUNTER — Ambulatory Visit: Payer: Medicaid Other | Admitting: Family Medicine

## 2022-01-24 ENCOUNTER — Ambulatory Visit (INDEPENDENT_AMBULATORY_CARE_PROVIDER_SITE_OTHER): Payer: Medicaid Other | Admitting: Family Medicine

## 2022-01-24 VITALS — BP 122/85 | HR 70 | Temp 98.3°F | Resp 16 | Ht 64.0 in

## 2022-01-24 DIAGNOSIS — K219 Gastro-esophageal reflux disease without esophagitis: Secondary | ICD-10-CM | POA: Diagnosis not present

## 2022-01-24 DIAGNOSIS — Z7689 Persons encountering health services in other specified circumstances: Secondary | ICD-10-CM

## 2022-01-24 DIAGNOSIS — F32A Depression, unspecified: Secondary | ICD-10-CM

## 2022-01-24 DIAGNOSIS — F419 Anxiety disorder, unspecified: Secondary | ICD-10-CM

## 2022-01-24 DIAGNOSIS — Z9884 Bariatric surgery status: Secondary | ICD-10-CM | POA: Diagnosis not present

## 2022-01-24 MED ORDER — SUCRALFATE 1 G PO TABS: 1.0000 g | ORAL_TABLET | Freq: Three times a day (TID) | ORAL | 1 refills | Status: DC

## 2022-01-24 MED ORDER — OMEPRAZOLE 20 MG PO CPDR: 20.0000 mg | DELAYED_RELEASE_CAPSULE | Freq: Every day | ORAL | 1 refills | Status: DC

## 2022-01-24 NOTE — Progress Notes (Unsigned)
Patient new to PCP. Patient said she was dx with ulcer and would like some medication. Patient also would like some medication for her swollen knee.

## 2022-01-26 MED ORDER — LIDOCAINE HCL 1 % IJ SOLN
4.0000 mL | INTRAMUSCULAR | Status: AC | PRN
  Administered 2022-01-15: 4 mL

## 2022-01-28 ENCOUNTER — Other Ambulatory Visit: Payer: Self-pay

## 2022-01-28 ENCOUNTER — Encounter: Payer: Self-pay | Admitting: Family Medicine

## 2022-01-28 ENCOUNTER — Ambulatory Visit: Payer: Medicaid Other | Attending: Internal Medicine | Admitting: Pharmacist

## 2022-01-28 ENCOUNTER — Other Ambulatory Visit (HOSPITAL_COMMUNITY): Payer: Self-pay

## 2022-01-28 DIAGNOSIS — Z7189 Other specified counseling: Secondary | ICD-10-CM | POA: Insufficient documentation

## 2022-01-28 DIAGNOSIS — M059 Rheumatoid arthritis with rheumatoid factor, unspecified: Secondary | ICD-10-CM | POA: Insufficient documentation

## 2022-01-28 DIAGNOSIS — Z79899 Other long term (current) drug therapy: Secondary | ICD-10-CM | POA: Insufficient documentation

## 2022-01-28 MED ORDER — ENBREL MINI 50 MG/ML ~~LOC~~ SOCT
50.0000 mg | SUBCUTANEOUS | 0 refills | Status: DC
  Filled 2022-01-28: qty 12, 84d supply, fill #0
  Filled 2022-01-29: qty 4, 28d supply, fill #0
  Filled 2022-02-21: qty 4, 28d supply, fill #1
  Filled 2022-03-18: qty 4, 28d supply, fill #2

## 2022-01-28 NOTE — Progress Notes (Signed)
New Patient Office Visit  Subjective    Patient ID: Elizabeth Bautista, female    DOB: [birth year]  Age: 46 y.o. MRN: [id number]  CC:  Chief Complaint  Patient presents with   Establish Care    HPI Elizabeth Bautista presents to establish care and with complaint of reflux symptoms. She has a hs of gastic bypass and is not on any meds.    Outpatient Encounter Medications as of 01/24/2022  Medication Sig   albuterol (PROAIR HFA) 108 (90 Base) MCG/ACT inhaler 2 puffs every 4 hours as needed only  if your can't catch your breath (Patient taking differently: Inhale 1-2 puffs into the lungs every 4 (four) hours as needed for wheezing or shortness of breath (only if you can't catch your breath).)   albuterol (PROVENTIL) (2.5 MG/3ML) 0.083% nebulizer solution INHALE 3 ML BY NEBULIZATION EVERY 6 HOURS AS NEEDED FOR WHEEZING OR SHORTNESS OF BREATH (Patient taking differently: Take 2.5 mg by nebulization every 6 (six) hours as needed for wheezing or shortness of breath.)   amLODipine (NORVASC) 5 MG tablet Take by mouth.   azelastine (ASTELIN) 0.1 % nasal spray Place 1 spray into both nostrils 2 (two) times daily. Use in each nostril as directed   blood glucose meter kit and supplies Dispense based on patient and insurance preference. Use up to four times daily as directed. (FOR ICD-10 E10.9, E11.9).   budesonide (PULMICORT) 0.5 MG/2ML nebulizer solution Inhale into the lungs.   busPIRone (BUSPAR) 7.5 MG tablet Take by mouth.   cetirizine (ZYRTEC) 10 MG tablet Take 1 tablet (10 mg total) by mouth daily.   Cholecalciferol 1.25 MG (50000 UT) TABS Take 1 tablet by mouth daily.   cyclobenzaprine (FLEXERIL) 10 MG tablet Take by mouth.   DULoxetine (CYMBALTA) 60 MG capsule Take by mouth.   EPINEPHrine 0.3 mg/0.3 mL IJ SOAJ injection Inject 0.3 mg into the muscle as needed for anaphylaxis.   famotidine (PEPCID) 20 MG tablet TAKE 1 TABLET BY MOUTH EVERY DAY (Patient taking differently: Take 20 mg by mouth daily.)    ferrous sulfate 325 (65 FE) MG tablet TAKE 1 TABLET BY MOUTH TWICE A DAY   fluticasone (FLONASE) 50 MCG/ACT nasal spray SPRAY 1 SPRAY INTO BOTH NOSTRILS DAILY. (Patient taking differently: Place 1 spray into both nostrils daily.)   Fluticasone-Umeclidin-Vilant (TRELEGY ELLIPTA) 200-62.5-25 MCG/ACT AEPB Inhale 1 puff into the lungs daily.   furosemide (LASIX) 20 MG tablet Take by mouth.   hydrochlorothiazide (HYDRODIURIL) 25 MG tablet Take by mouth.   HYDROcodone-acetaminophen (NORCO/VICODIN) 5-325 MG tablet Take 1 tablet by mouth every 4 (four) hours as needed for moderate pain.   hydroxychloroquine (PLAQUENIL) 200 MG tablet Take 1 tablet (200 mg total) by mouth 2 (two) times daily.   ipratropium-albuterol (DUONEB) 0.5-2.5 (3) MG/3ML SOLN Take 3 mLs by nebulization every 6 (six) hours as needed. (Patient taking differently: Take 3 mLs by nebulization every 6 (six) hours as needed (SHORTNESS OF BREATH).)   levETIRAcetam (KEPPRA) 500 MG tablet Take 1 tablet (500 mg total) by mouth 2 (two) times daily.   metoCLOPramide (REGLAN) 10 MG tablet Take 10 mg by mouth as needed.   Milnacipran HCl (SAVELLA) 12.5 MG TABS Take 1 tablet (12.5 mg total) by mouth 2 (two) times daily.   montelukast (SINGULAIR) 10 MG tablet TAKE 1 TABLET BY MOUTH EVERYDAY AT BEDTIME (Patient taking differently: Take 10 mg by mouth at bedtime.)   montelukast (SINGULAIR) 10 MG tablet Take by mouth.   omeprazole (PRILOSEC) 20  MG capsule Take 1 capsule (20 mg total) by mouth daily.   ondansetron (ZOFRAN-ODT) 8 MG disintegrating tablet Take 1 tablet (8 mg total) by mouth every 8 (eight) hours as needed for nausea or vomiting.   pantoprazole (PROTONIX) 40 MG tablet TAKE 1 TABLET (40 MG TOTAL) BY MOUTH DAILY. TAKE 30-60 MIN BEFORE FIRST MEAL OF THE DAY   pantoprazole (PROTONIX) 40 MG tablet Take by mouth.   permethrin (ELIMITE) 5 % cream    predniSONE (DELTASONE) 10 MG tablet Take 1 tablet (10 mg total) by mouth 3 (three) times daily.    pregabalin (LYRICA) 200 MG capsule Take 1 capsule (200 mg total) by mouth in the morning, at noon, and at bedtime.   QUEtiapine (SEROQUEL) 100 MG tablet Take 50-100 mg by mouth at bedtime.   revefenacin (YUPELRI) 175 MCG/3ML nebulizer solution Take 3 mLs (175 mcg total) by nebulization daily.   rizatriptan (MAXALT-MLT) 10 MG disintegrating tablet Take 1 tablet earliest onset of migraine.  May repeat in 2 hours if needed. Maximum 2 tablets in 24 hours (Patient taking differently: Take 10 mg by mouth as needed for migraine. Take 1 tablet earliest onset of migraine.  May repeat in 2 hours if needed. Maximum 2 tablets in 24 hours)   sucralfate (CARAFATE) 1 g tablet Take 1 tablet (1 g total) by mouth 4 (four) times daily -  with meals and at bedtime.   ursodiol (ACTIGALL) 300 MG capsule Take 300 mg by mouth 2 (two) times daily.   zonisamide (ZONEGRAN) 100 MG capsule Take 1 capsule (100 mg total) by mouth daily.   norethindrone (AYGESTIN) 5 MG tablet Take 1 tablet (5 mg total) by mouth daily.   No facility-administered encounter medications on file as of 01/24/2022.    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 side of the heart"; upcoming appt with cardiology for evaluation   HTN (hypertension)    Rheumatoid arthritis ([place])    on Plaquenil    Seizures ([place])    last seizure 2019   Sleep apnea    Sleep apnea     Past Surgical History:  Procedure Laterality Date   APPENDECTOMY     CESAREAN SECTION     x3   GASTRIC BYPASS      Family History  Problem Relation Age of Onset   High blood pressure Mother    Rheum arthritis Mother    Diabetes Father    Diabetes Sister    Heart Problems Sister    Diabetes Brother    Diabetes Paternal  Grandmother    Diabetes Other        father's side "everybody died from Diabetes"   High blood pressure Other        mother's side, multiple siblings with this    Heart attack Other        family member on mother's side    Diabetes Paternal Aunt    Seizures Cousin        not sibings to the other cousins with seizures   Breast cancer Cousin    Seizures Cousin        not sibings to the  other cousins with seizures   Seizures Cousin        not sibings to the other cousins with seizures   Cervical cancer Maternal Aunt    Breast cancer Cousin    Dementia Maternal Aunt    Asthma Maternal Aunt    Heart Problems Maternal Grandmother    Diabetes Brother    Asthma Daughter    Angioedema Daughter    Asthma Son     Social History   Socioeconomic History   Marital status: Single    Spouse name: Not on file   Number of children: 5   Years of education: Not on file   Highest education level: 9th grade  Occupational History   Not on file  Tobacco Use   Smoking status: Former    Packs/day: 0.05    Years: 10.00    Total pack years: 0.50    Types: Cigarettes    Quit date: 06/02/2013    Years since quitting: 8.6    Passive exposure: Never   Smokeless tobacco: Never   Tobacco comments:    during the 10 years of smoking, smoked 2-3 cigarettes/day  Vaping Use   Vaping Use: Never used  Substance and Sexual Activity   Alcohol use: Not Currently    Comment: occ   Drug use: Not Currently   Sexual activity: Not Currently    Birth control/protection: Surgical    Comment: tubal  Other Topics Concern   Not on file  Social History Narrative   Divorced.Lives with 3 kids.Originally from [place].Came from [place] 7 months ago.      02/27/2020   Right handed   Caffeine: none    Social Determinants of Health   Financial Resource Strain: High Risk (08/08/2021)   Overall Financial Resource Strain (CARDIA)    Difficulty of Paying Living Expenses: Very hard  Food Insecurity: Food Insecurity  Present (08/08/2021)   Hunger Vital Sign    Worried About Running Out of Food in the Last Year: Sometimes true    Ran Out of Food in the Last Year: Often true  Transportation Needs: No Transportation Needs (08/08/2021)   PRAPARE - Hydrologist (Medical): No    Lack of Transportation (Non-Medical): No  Physical Activity: Insufficiently Active (08/08/2021)   Exercise Vital Sign    Days of Exercise per Week: 1 day    Minutes of Exercise per Session: 10 min  Stress: Stress Concern Present (08/08/2021)   [place]    Feeling of Stress : Very much  Social Connections: Socially Isolated (08/08/2021)   Social Connection and Isolation Panel [NHANES]    Frequency of Communication with Friends and Family: Once a week    Frequency of Social Gatherings with Friends and Family: Never    Attends Religious Services: Never    Marine scientist or Organizations: No    Attends Archivist Meetings: Never    Marital Status: Divorced  Human resources officer Violence: Not At Risk (08/08/2021)   Humiliation, Afraid, Rape, and Kick questionnaire    Fear of Current or Ex-Partner: No    Emotionally Abused: No    Physically Abused: No    Sexually Abused: No    Review of Systems  Gastrointestinal:  Positive for abdominal pain and heartburn. Negative for blood in stool, constipation and diarrhea.  All other systems reviewed and are negative.       Objective    BP 122/85  Pulse 70   Temp 98.3 F (36.8 C) (Oral)   Resp 16   Ht 5' 4"  (1.626 m)   LMP 01/05/2022 (Exact Date)   SpO2 96%   BMI 37.87 kg/m   Physical Exam Vitals and nursing note reviewed.  Constitutional:      General: She is not in acute distress. Cardiovascular:     Rate and Rhythm: Normal rate and regular rhythm.  Pulmonary:     Effort: Pulmonary effort is normal.     Breath sounds: Normal breath sounds.  Abdominal:     Palpations:  Abdomen is soft.     Tenderness: There is no abdominal tenderness.  Neurological:     General: No focal deficit present.     Mental Status: She is alert and oriented to person, place, and time.  Psychiatric:        Mood and Affect: Affect normal. Mood is anxious.        Behavior: Behavior normal. Behavior is cooperative.         Assessment & Plan:   1. Gastroesophageal reflux disease without esophagitis Carafate prescribed. Continue PPI  2. History of gastric bypass As above  3. Anxiety and depression Patient on meds. Continue and monitor  4. Encounter to establish care     Return in about 4 weeks (around 02/21/2022) for follow up.   Becky Sax, MD

## 2022-01-28 NOTE — Progress Notes (Signed)
Attempted to call to pt.  Left HIPAA compliant message on pt vm to return call [phone]

## 2022-01-28 NOTE — Progress Notes (Signed)
Pharmacy Note  Subjective:   Patient presents to clinic today to receive first dose of Enbrel for seropositive rheumatoid arthritis. She is naive to biologics for RA.  Patient self-administered Nucala via asthma/allergy and feels comfortable with self-injecting medications. She states she is in the process of enrolling into disability Medicare/Medicaid (earliest active date would be November)  Patient running a fever or have signs/symptoms of infection? No  Patient currently on antibiotics for the treatment of infection? No  Patient have any upcoming invasive procedures/surgeries? No  Objective: CMP     Component Value Date/Time   NA 139 01/15/2022 1145   NA 142 04/16/2020 1153   K 3.9 01/15/2022 1145   CL 103 01/15/2022 1145   CO2 26 01/15/2022 1145   GLUCOSE 79 01/15/2022 1145   BUN 5 (L) 01/15/2022 1145   BUN 7 04/16/2020 1153   CREATININE 0.45 (L) 01/15/2022 1145   CALCIUM 8.9 01/15/2022 1145   PROT 6.1 01/15/2022 1145   PROT 7.3 02/16/2020 1600   ALBUMIN 3.7 11/09/2021 1124   ALBUMIN 4.6 02/16/2020 1600   AST 12 01/15/2022 1145   ALT 8 01/15/2022 1145   ALKPHOS 72 11/09/2021 1124   BILITOT 0.5 01/15/2022 1145   BILITOT 0.3 02/16/2020 1600   GFRNONAA >60 11/09/2021 1124   GFRNONAA 104 10/11/2020 1006   GFRAA 121 10/11/2020 1006    CBC    Component Value Date/Time   WBC 7.6 01/15/2022 1145   RBC 4.51 01/15/2022 1145   HGB 11.1 (L) 01/15/2022 1145   HGB 12.9 02/16/2020 1600   HCT 35.6 01/15/2022 1145   HCT 41.9 02/16/2020 1600   PLT 289 01/15/2022 1145   PLT 329 02/16/2020 1600   MCV 78.9 (L) 01/15/2022 1145   MCV 72 (L) 02/16/2020 1600   MCH 24.6 (L) 01/15/2022 1145   MCHC 31.2 (L) 01/15/2022 1145   RDW 17.4 (H) 01/15/2022 1145   RDW 17.7 (H) 02/16/2020 1600   LYMPHSABS 1,277 01/15/2022 1145   LYMPHSABS 2.5 02/16/2020 1600   MONOABS 0.4 11/09/2021 1124   EOSABS 213 01/15/2022 1145   EOSABS 0.5 (H) 02/16/2020 1600   BASOSABS 38 01/15/2022 1145    BASOSABS 0.1 02/16/2020 1600    Baseline Immunosuppressant Therapy Labs TB GOLD    Latest Ref Rng & Units 01/15/2022   11:45 AM  Quantiferon TB Gold  Quantiferon TB Gold Plus NEGATIVE NEGATIVE    Hepatitis Panel    Latest Ref Rng & Units 10/11/2020   10:06 AM  Hepatitis  Hep B Surface Ag NON-REACTIVE NON-REACTIVE   Hep B IgM NON-REACTIVE NON-REACTIVE   Hep C Ab NON-REACTIVE NON-REACTIVE    HIV Lab Results  Component Value Date   HIV Non Reactive 08/08/2021   HIV NON-REACTIVE 12/25/2020   HIV Non Reactive 06/04/2020   HIV NON-REACTIVE 02/01/2020   HIV Non Reactive 10/19/2019   Immunoglobulins    Latest Ref Rng & Units 10/11/2020   10:06 AM  Immunoglobulin Electrophoresis  IgA  47 - 310 mg/dL 161   IgG 096 - 0,454 mg/dL 098   IgM 50 - 119 mg/dL 93    SPEP    Latest Ref Rng & Units 01/15/2022   11:45 AM  Serum Protein Electrophoresis  Total Protein 6.1 - 8.1 g/dL 6.1    J4NW No results found for: "G6PDH" TPMT No results found for: "TPMT"   Chest x-ray: 07/20/21 - Cardiomegaly without acute abnormality of the lungs in AP portable projection.  Assessment/Plan:  Counseled patient that Enbrel  is a TNF blocking agent.  Counseled patient on purpose, proper use, and adverse effects of Enbrel.  Reviewed the most common adverse effects including infections, headache, and injection site reactions.  Discussed that there is the possibility of an increased risk of malignancy including non-melanoma skin cancer but it is not well understood if this increased risk is due to the medication or the disease state.  Advised patient to get yearly dermatology exams due to risk of skin cancer.  Counseled patient that Enbrel should be held prior to scheduled surgery.  Reviewed the importance of regular labs while on Enbrel therapy.  Will monitor CBC and CMP 1 month after starting and then every 3 months routinely thereafter. Will monitor TB gold annually.  Reviewed storage instructions for  Enbrel.  Patient is not currently using contraception (states she is not sexually active). We reviewed that Enbrel is not studied in pregnancy, lactation, and she should notify our clinic if her family planning status changes or she becomes pregnant. She verbalized understanding to all of the above.  Demonstrated proper injection technique with Enbrel Mini demo device  Patient able to demonstrate proper injection technique using the teach back method.  Patient self injected in the right thigh with:  Sample Medication: Enbrel Mini 50mg /ml cartridge NDC: Lot: [id number] Expiration: 07/2023  Sample Medication: Enbrel Autotouch Injector Device NDC: 08/2023 Lot: [id number] Expiration: 09/29/2024  Patient tolerated well.  Observed for 30 mins in office for adverse reaction and none noted.   Patient is to return in 1 month for labs and 6-8 weeks for follow-up appointment.  Standing orders placed.  Referral placed to [facility] for yearly skin checks while on TNF inhibitor due to risk for non-melanoma skin cancer.  Enbrel approved through insurance .   Rx sent to: [facility]: [phone] .  Patient provided with pharmacy phone number and advised that [facility] will be reaching out to schedule the first shipment. Subsequent refill calls will be managed by [facility] team  She is aware to notify our clinic if she has change in insurance because we will have to re-authorize Enbrel use through new plan  Patient will continue Enbrel 50mg  SQ every 7 days as monotherapy  All questions encouraged and answered.  Instructed patient to call with any further questions or concerns.  [facility], PharmD, MPH, BCPS, CPP Clinical Pharmacist (Rheumatology and Pulmonology)  01/28/2022 8:26 AM

## 2022-01-28 NOTE — Patient Instructions (Addendum)
Your next ENBREL dose is due on 02/04/22, 02/11/22, and every 7 days thereafter  CONTINUE hydroxychloroquine 200mg  twice daily  HOLD ENBREL if you have signs or symptoms of an infection. You can resume once you feel better or back to your baseline. HOLD ENBREL if you start antibiotics to treat an infection. HOLD ENBREL around the time of surgery/procedures. Your surgeon will be able to provide recommendations on when to hold BEFORE and when you are cleared to RESUME.  Pharmacy information: Your prescription will be shipped from [facility]. Their phone number is [phone] [facility] will call you to collect the copay and schedule the first shipment of the medication to your home. They will mail your medication to your home. After the first call with [facility], the pharmacy's staff will reach out to you directly to schedule shipments.  Labs are due in 1 month then every 3 months. Lab hours are from Monday to Thursday 1:30-4:30pm and Friday 1:30-4pm. You do not need an appointment if you come for labs during these times.  How to manage an injection site reaction: Remember the 5 C's: COUNTER - leave on the counter at least 30 minutes but up to overnight to bring medication to room temperature. This may help prevent stinging COLD - place something cold (like an ice gel pack or cold water bottle) on the injection site just before cleansing with alcohol. This may help reduce pain CLARITIN - use Claritin (generic name is loratadine) for the first two weeks of treatment or the day of, the day before, and the day after injecting. This will help to minimize injection site reactions CORTISONE CREAM - apply if injection site is irritated and itching CALL ME - if injection site reaction is bigger than the size of your fist, looks infected, blisters, or if you develop hives

## 2022-01-28 NOTE — Telephone Encounter (Signed)
Patient completed first dose of Enbrel clinic. Rx sent to [facility]. Routed to [facility] for onboarding needs.  Chesley Mires, PharmD, MPH, BCPS, CPP Clinical Pharmacist (Rheumatology and Pulmonology)

## 2022-01-29 ENCOUNTER — Other Ambulatory Visit (HOSPITAL_COMMUNITY): Payer: Self-pay

## 2022-01-29 NOTE — Telephone Encounter (Signed)
Delivery instructions have been updated in [facility], medication will be shipped to patient's home address by UPS.  Rx has been processed in [facility] and there is a copay of $4.

## 2022-01-30 ENCOUNTER — Ambulatory Visit: Payer: Medicaid Other | Admitting: Internal Medicine

## 2022-01-30 ENCOUNTER — Other Ambulatory Visit (HOSPITAL_COMMUNITY): Payer: Self-pay

## 2022-01-30 NOTE — Progress Notes (Deleted)
NEUROLOGY FOLLOW UP OFFICE NOTE  Deola Rewis [id number]  Assessment/Plan:   Migraine without aura, without status migrainosus, not intractable Memory deficits Left sided numbness   Migraine prevention:  Start zonisamide 70m daily and titrate to 1034mdaily.  Would not use beta blocker as she has severe asthma.  Would not use an antidepressant as her current antidepressant regimen is being adjusted by her other physicians.  Would not use topiramate as she already endorses memory deficits and paresthesias. Migraine rescue:  Stop sumatriptan and promethazine.  Start rizatriptan MLT 1045mnd Zofran ODT 8mg26mdue to severe nausea and vomiting, will require sublingual form Due to her symptoms (left sided numbness and memory deficits), check MRI of brain with and without contrast and B12 level Limit use of pain relievers to no more than 2 days out of week to prevent risk of rebound or medication-overuse headache. Keep headache diary Follow up 5 months.       Subjective:  Demaris ReneVarona 45 y30r old right-handed female with asthma. HTN, DM II, RA, iron-deficiency anemia, sleep apnea and pseudoseizures who follows up for migraines.  UPDATE: Due to subjective left sided numbness and memory complaints, MRI of brain with and without contrast was performed on 09/24/2021, which was personally reviewed and was normal.  B12 was 397.  Started zonidamide last visit.  Changed sumatriptan to rizatriptan.   Intensity:  *** Duration:  *** Frequency:  *** Frequency of abortive medication: *** Current NSAIDS/analgesics:  acetaminophen Current triptans:  rizatriptan 10mg85mrent ergotamine:  none Current anti-emetic: Zofran????*** Current muscle relaxants:  Flexeril Current Antihypertensive medications:  HCTZ Current Antidepressant medications:  Cymbalta 60mg 54my, trazodone, Lexapro 20mg d80m (currently being tapered off of Lexapro and trazodone) Current Anticonvulsant medications:  Zonisamide  100mg da17m Lyrica 300mg BID84mrent anti-CGRP:  none Current Antihistamines/Decongestants:  Zyrtec Other therapy:  none Hormone/birth control:  none Other medications:  Plaquenil, Seroquel  Caffeine:  no Alcohol:  occasional Smoker:  no Diet:  Drinks a lot of water.  Planning to have bariatric surgery Exercise:  she does but limited due to chronic pain (RA and fibromyalgia) Depression:  yes; Anxiety:  yes.  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 CT head on 03/10/2020 personally reviewed normal.  EEG on 03/12/2020 normal. Other pain:  Chronic pain syndrome - has RA but also underlying fibromyalgia suspected.  Followed by physical medicine and rehab Sleep hygiene:  Poor sleep.  May be related to stress.  She has OSA.  CPAP recently adjusted.  HISTORY:  History of migraines since childhood but resolved in young adulthood.  They returned around 2021 but progressively has gotten worse.  Severe diffuse squeezing and stabbing headache including the neck.  Back of neck gets stiff.  Nausea, vomiting, blurred vision/or vision goes dark; She saw an ophthalmologist and had an unremarkable eye exam.  Sometimes bilateral hand tingling, photophobia and phonophobia.  Usually last all day.  They occur usually 4 days a week.  No known triggers.  Nothing helps them.  She also reports short term memory problems in 2021 as well.  She says she will sometimes leave the stove on or may forget conversations.  Prior thyroid tests okay.  Fell a co[place] Circle of weeks ago and hit the back of her head.  Has a lump on back of head.    Past NSAIDS/analgesics:  tramadol, ibuprofen, naproxen Past abortive triptans:  sumatriptan tab Past abortive ergotamine:  none Past muscle relaxants:  none Past  anti-emetic:  Reglan, Phenergan 70m, Zofran 826mPast antihypertensive medications:  amlodipine, Lasix.  Beta blockers contraindicated due to severe asthma.   Past antidepressant  medications:  none Past anticonvulsant medications:  Keppra, gabapentin Past anti-CGRP:  none Past antihistamines/decongestants:  Flonase Other past therapies:  none    Family history of headache:  Mother (migraines)  PAST MEDICAL HISTORY: Past Medical History:  Diagnosis Date   [place]   Anemia    Asthma    COVID-19 virus infection 05/16/2020   04/27/20 dx covid-19, not hospitalized   [place]   [place] Met[place]   Eczema    Edema    Heart problem    "something with the arteries on the left side of the heart"; upcoming appt with cardiology for evaluation   HTN (hypertension)    [place]   [place] Pl[place]    [place]   last seizure 2019   Sleep apnea    Sleep apnea     MEDICATIONS: Current Outpatient Medications on File Prior to Visit  Medication Sig Dispense Refill   albuterol (PROAIR HFA) 108 (90 Base) MCG/ACT inhaler 2 puffs every 4 hours as needed only  if your can't catch your breath (Patient taking differently: Inhale 1-2 puffs into the lungs every 4 (four) hours as needed for wheezing or shortness of breath (only if you can't catch your breath).) 18 g 11   albuterol (PROVENTIL) (2.5 MG/3ML) 0.083% nebulizer solution INHALE 3 ML BY NEBULIZATION EVERY 6 HOURS AS NEEDED FOR WHEEZING OR SHORTNESS OF BREATH (Patient taking differently: Take 2.5 mg by nebulization every 6 (six) hours as needed for wheezing or shortness of breath.) 300 mL 0   amLODipine (NORVASC) 5 MG tablet Take 5 mg by mouth daily.     azelastine (ASTELIN) 0.1 % nasal spray Place 1 spray into both nostrils 2 (two) times daily. Use in each nostril as directed 30 mL 12   blood glucose meter kit and supplies Dispense based on patient and insurance preference. Use up to four times daily as directed. (FOR ICD-10 E10.9, E11.9). 1 each 0   busPIRone (BUSPAR) 7.5 MG tablet Take 7.5 mg by mouth daily.     cetirizine (ZYRTEC) 10 MG tablet Take 1 tablet  (10 mg total) by mouth daily. 30 tablet 5   Cholecalciferol 1.25 MG (50000 UT) TABS Take 1 tablet by mouth daily. (Patient taking differently: Take 1 capsule by mouth once a week.) 90 tablet 0   cyclobenzaprine (FLEXERIL) 10 MG tablet Take 10 mg by mouth daily.     DULoxetine (CYMBALTA) 60 MG capsule Take 60 mg by mouth daily.     EPINEPHrine 0.3 mg/0.3 mL IJ SOAJ injection Inject 0.3 mg into the muscle as needed for anaphylaxis. 1 each 1   Etanercept (ENBREL MINI) 50 MG/ML SOCT Inject 50 mg into the skin once a week. 12 mL 0   famotidine (PEPCID) 20 MG tablet TAKE 1 TABLET BY MOUTH EVERY DAY (Patient taking differently: Take 20 mg by mouth daily.) 30 tablet 0   ferrous sulfate 325 (65 FE) MG tablet TAKE 1 TABLET BY MOUTH TWICE A DAY 180 tablet 1   fluticasone (FLONASE) 50 MCG/ACT nasal spray SPRAY 1 SPRAY INTO BOTH NOSTRILS DAILY. (Patient taking differently: Place 1 spray into both nostrils daily.) 16 mL 5   Fluticasone-Umeclidin-Vilant (TRELEGY ELLIPTA) 200-62.5-25 MCG/ACT AEPB Inhale 1 puff into the lungs daily. 28 each 5   furosemide (LASIX) 20  MG tablet Take 20 mg by mouth daily.     hydrochlorothiazide (HYDRODIURIL) 25 MG tablet Take 25 mg by mouth daily.     HYDROcodone-acetaminophen (NORCO/VICODIN) 5-325 MG tablet Take 1 tablet by mouth every 4 (four) hours as needed for moderate pain. 10 tablet 0   hydroxychloroquine (PLAQUENIL) 200 MG tablet Take 1 tablet (200 mg total) by mouth 2 (two) times daily. 60 tablet 2   ipratropium-albuterol (DUONEB) 0.5-2.5 (3) MG/3ML SOLN Take 3 mLs by nebulization every 6 (six) hours as needed. (Patient taking differently: Take 3 mLs by nebulization every 6 (six) hours as needed (SHORTNESS OF BREATH).) 360 mL 2   levETIRAcetam (KEPPRA) 500 MG tablet Take 1 tablet (500 mg total) by mouth 2 (two) times daily. 60 tablet 2   Mepolizumab (NUCALA) 100 MG/ML SOAJ Inject 100 mg into the skin every 28 (twenty-eight) days.     metoCLOPramide (REGLAN) 10 MG tablet Take  10 mg by mouth as needed.     Milnacipran HCl (SAVELLA) 12.5 MG TABS Take 1 tablet (12.5 mg total) by mouth 2 (two) times daily. 60 tablet 1   montelukast (SINGULAIR) 10 MG tablet TAKE 1 TABLET BY MOUTH EVERYDAY AT BEDTIME (Patient taking differently: Take 10 mg by mouth at bedtime.) 30 tablet 0   omeprazole (PRILOSEC) 20 MG capsule Take 1 capsule (20 mg total) by mouth daily. 30 capsule 1   ondansetron (ZOFRAN-ODT) 8 MG disintegrating tablet Take 1 tablet (8 mg total) by mouth every 8 (eight) hours as needed for nausea or vomiting. 20 tablet 5   pantoprazole (PROTONIX) 40 MG tablet TAKE 1 TABLET (40 MG TOTAL) BY MOUTH DAILY. TAKE 30-60 MIN BEFORE FIRST MEAL OF THE DAY (Patient not taking: Reported on 01/28/2022) 30 tablet 2   pregabalin (LYRICA) 200 MG capsule Take 1 capsule (200 mg total) by mouth in the morning, at noon, and at bedtime. 90 capsule 2   QUEtiapine (SEROQUEL) 100 MG tablet Take 100 mg by mouth at bedtime.     revefenacin (YUPELRI) 175 MCG/3ML nebulizer solution Take 3 mLs (175 mcg total) by nebulization daily. 30 mL 0   rizatriptan (MAXALT-MLT) 10 MG disintegrating tablet Take 1 tablet earliest onset of migraine.  May repeat in 2 hours if needed. Maximum 2 tablets in 24 hours (Patient taking differently: Take 10 mg by mouth as needed for migraine. Take 1 tablet earliest onset of migraine.  May repeat in 2 hours if needed. Maximum 2 tablets in 24 hours) 10 tablet 5   sucralfate (CARAFATE) 1 g tablet Take 1 tablet (1 g total) by mouth 4 (four) times daily -  with meals and at bedtime. 120 tablet 1   ursodiol (ACTIGALL) 300 MG capsule Take 300 mg by mouth 2 (two) times daily.     zonisamide (ZONEGRAN) 100 MG capsule Take 1 capsule (100 mg total) by mouth daily. 30 capsule 5   No current facility-administered medications on file prior to visit.    ALLERGIES: Allergies  Allergen Reactions   Ferumoxytol Anaphylaxis    ~5 years ago. Tolerated IV venofer   Phenytoin Anaphylaxis     Other reaction(s): swelling and heart rate decreased   Pecan Nut (Diagnostic) Other (See Comments)   Phenylbutazones Other (See Comments)    FAMILY HISTORY: Family History  Problem Relation Age of Onset   High blood pressure Mother    Rheum arthritis Mother    Diabetes Father    Diabetes Sister    Heart Problems Sister    Diabetes  Brother    Diabetes Paternal Grandmother    Diabetes Other        father's side "everybody died from Diabetes"   High blood pressure Other        mother's side, multiple siblings with this    Heart attack Other        family member on mother's side    Diabetes Paternal Aunt    Seizures Cousin        not sibings to the other cousins with seizures   Breast cancer Cousin    Seizures Cousin        not sibings to the other cousins with seizures   Seizures Cousin        not sibings to the other cousins with seizures   Cervical cancer Maternal Aunt    Breast cancer Cousin    Dementia Maternal Aunt    Asthma Maternal Aunt    Heart Problems Maternal Grandmother    Diabetes Brother    Asthma Daughter    Angioedema Daughter    Asthma Son       Objective:  *** General: No acute distress.  Patient appears ***-groomed.   Head:  Normocephalic/atraumatic Eyes:  Fundi examined but not visualized Neck: supple, no paraspinal tenderness, full range of motion Heart:  Regular rate and rhythm Lungs:  Clear to auscultation bilaterally Back: No paraspinal tenderness Neurological Exam: alert and oriented to person, place, and time.  Speech fluent and not dysarthric, language intact.  CN II-XII intact. Bulk and tone normal, muscle strength 5/5 throughout.  Sensation to light touch intact.  Deep tendon reflexes 2+ throughout, toes downgoing.  Finger to nose testing intact.  Gait normal, Romberg negative.   Metta Clines, DO  CC: ***

## 2022-02-04 ENCOUNTER — Ambulatory Visit: Payer: Medicaid Other | Admitting: Neurology

## 2022-02-04 NOTE — Progress Notes (Deleted)
NEUROLOGY FOLLOW UP OFFICE NOTE  Deola Rewis [id number]  Assessment/Plan:   Migraine without aura, without status migrainosus, not intractable Memory deficits Left sided numbness   Migraine prevention:  Start zonisamide 70m daily and titrate to 1034mdaily.  Would not use beta blocker as she has severe asthma.  Would not use an antidepressant as her current antidepressant regimen is being adjusted by her other physicians.  Would not use topiramate as she already endorses memory deficits and paresthesias. Migraine rescue:  Stop sumatriptan and promethazine.  Start rizatriptan MLT 1045mnd Zofran ODT 8mg26mdue to severe nausea and vomiting, will require sublingual form Due to her symptoms (left sided numbness and memory deficits), check MRI of brain with and without contrast and B12 level Limit use of pain relievers to no more than 2 days out of week to prevent risk of rebound or medication-overuse headache. Keep headache diary Follow up 5 months.       Subjective:  Demaris ReneVarona 45 y30r old right-handed female with asthma. HTN, DM II, RA, iron-deficiency anemia, sleep apnea and pseudoseizures who follows up for migraines.  UPDATE: Due to subjective left sided numbness and memory complaints, MRI of brain with and without contrast was performed on 09/24/2021, which was personally reviewed and was normal.  B12 was 397.  Started zonidamide last visit.  Changed sumatriptan to rizatriptan.   Intensity:  *** Duration:  *** Frequency:  *** Frequency of abortive medication: *** Current NSAIDS/analgesics:  acetaminophen Current triptans:  rizatriptan 10mg85mrent ergotamine:  none Current anti-emetic: Zofran????*** Current muscle relaxants:  Flexeril Current Antihypertensive medications:  HCTZ Current Antidepressant medications:  Cymbalta 60mg 54my, trazodone, Lexapro 20mg d80m (currently being tapered off of Lexapro and trazodone) Current Anticonvulsant medications:  Zonisamide  100mg da17m Lyrica 300mg BID84mrent anti-CGRP:  none Current Antihistamines/Decongestants:  Zyrtec Other therapy:  none Hormone/birth control:  none Other medications:  Plaquenil, Seroquel  Caffeine:  no Alcohol:  occasional Smoker:  no Diet:  Drinks a lot of water.  Planning to have bariatric surgery Exercise:  she does but limited due to chronic pain (RA and fibromyalgia) Depression:  yes; Anxiety:  yes.  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 CT head on 03/10/2020 personally reviewed normal.  EEG on 03/12/2020 normal. Other pain:  Chronic pain syndrome - has RA but also underlying fibromyalgia suspected.  Followed by physical medicine and rehab Sleep hygiene:  Poor sleep.  May be related to stress.  She has OSA.  CPAP recently adjusted.  HISTORY:  History of migraines since childhood but resolved in young adulthood.  They returned around 2021 but progressively has gotten worse.  Severe diffuse squeezing and stabbing headache including the neck.  Back of neck gets stiff.  Nausea, vomiting, blurred vision/or vision goes dark; She saw an ophthalmologist and had an unremarkable eye exam.  Sometimes bilateral hand tingling, photophobia and phonophobia.  Usually last all day.  They occur usually 4 days a week.  No known triggers.  Nothing helps them.  She also reports short term memory problems in 2021 as well.  She says she will sometimes leave the stove on or may forget conversations.  Prior thyroid tests okay.  Fell a co[place] of weeks ago and hit the back of her head.  Has a lump on back of head.    Past NSAIDS/analgesics:  tramadol, ibuprofen, naproxen Past abortive triptans:  sumatriptan tab Past abortive ergotamine:  none Past muscle relaxants:  none Past  anti-emetic:  Reglan, Phenergan 70m, Zofran 826mPast antihypertensive medications:  amlodipine, Lasix.  Beta blockers contraindicated due to severe asthma.   Past antidepressant  medications:  none Past anticonvulsant medications:  Keppra, gabapentin Past anti-CGRP:  none Past antihistamines/decongestants:  Flonase Other past therapies:  none    Family history of headache:  Mother (migraines)  PAST MEDICAL HISTORY: Past Medical History:  Diagnosis Date   [place]   Anemia    Asthma    COVID-19 virus infection 05/16/2020   04/27/20 dx covid-19, not hospitalized   [place]   [place] Met[place]   Eczema    Edema    Heart problem    "something with the arteries on the left side of the heart"; upcoming appt with cardiology for evaluation   HTN (hypertension)    [place]   [place] Plaquen[place]    [place] ([facility]   last seizure 2019   Sleep apnea    Sleep apnea     MEDICATIONS: Current Outpatient Medications on File Prior to Visit  Medication Sig Dispense Refill   albuterol (PROAIR HFA) 108 (90 Base) MCG/ACT inhaler 2 puffs every 4 hours as needed only  if your can't catch your breath (Patient taking differently: Inhale 1-2 puffs into the lungs every 4 (four) hours as needed for wheezing or shortness of breath (only if you can't catch your breath).) 18 g 11   albuterol (PROVENTIL) (2.5 MG/3ML) 0.083% nebulizer solution INHALE 3 ML BY NEBULIZATION EVERY 6 HOURS AS NEEDED FOR WHEEZING OR SHORTNESS OF BREATH (Patient taking differently: Take 2.5 mg by nebulization every 6 (six) hours as needed for wheezing or shortness of breath.) 300 mL 0   amLODipine (NORVASC) 5 MG tablet Take 5 mg by mouth daily.     azelastine (ASTELIN) 0.1 % nasal spray Place 1 spray into both nostrils 2 (two) times daily. Use in each nostril as directed 30 mL 12   blood glucose meter kit and supplies Dispense based on patient and insurance preference. Use up to four times daily as directed. (FOR ICD-10 E10.9, E11.9). 1 each 0   busPIRone (BUSPAR) 7.5 MG tablet Take 7.5 mg by mouth daily.     cetirizine (ZYRTEC) 10 MG tablet Take 1 tablet  (10 mg total) by mouth daily. 30 tablet 5   Cholecalciferol 1.25 MG (50000 UT) TABS Take 1 tablet by mouth daily. (Patient taking differently: Take 1 capsule by mouth once a week.) 90 tablet 0   cyclobenzaprine (FLEXERIL) 10 MG tablet Take 10 mg by mouth daily.     DULoxetine (CYMBALTA) 60 MG capsule Take 60 mg by mouth daily.     EPINEPHrine 0.3 mg/0.3 mL IJ SOAJ injection Inject 0.3 mg into the muscle as needed for anaphylaxis. 1 each 1   Etanercept (ENBREL MINI) 50 MG/ML SOCT Inject 50 mg into the skin once a week. 12 mL 0   famotidine (PEPCID) 20 MG tablet TAKE 1 TABLET BY MOUTH EVERY DAY (Patient taking differently: Take 20 mg by mouth daily.) 30 tablet 0   ferrous sulfate 325 (65 FE) MG tablet TAKE 1 TABLET BY MOUTH TWICE A DAY 180 tablet 1   fluticasone (FLONASE) 50 MCG/ACT nasal spray SPRAY 1 SPRAY INTO BOTH NOSTRILS DAILY. (Patient taking differently: Place 1 spray into both nostrils daily.) 16 mL 5   Fluticasone-Umeclidin-Vilant (TRELEGY ELLIPTA) 200-62.5-25 MCG/ACT AEPB Inhale 1 puff into the lungs daily. 28 each 5   furosemide (LASIX) 20  MG tablet Take 20 mg by mouth daily.     hydrochlorothiazide (HYDRODIURIL) 25 MG tablet Take 25 mg by mouth daily.     HYDROcodone-acetaminophen (NORCO/VICODIN) 5-325 MG tablet Take 1 tablet by mouth every 4 (four) hours as needed for moderate pain. 10 tablet 0   hydroxychloroquine (PLAQUENIL) 200 MG tablet Take 1 tablet (200 mg total) by mouth 2 (two) times daily. 60 tablet 2   ipratropium-albuterol (DUONEB) 0.5-2.5 (3) MG/3ML SOLN Take 3 mLs by nebulization every 6 (six) hours as needed. (Patient taking differently: Take 3 mLs by nebulization every 6 (six) hours as needed (SHORTNESS OF BREATH).) 360 mL 2   levETIRAcetam (KEPPRA) 500 MG tablet Take 1 tablet (500 mg total) by mouth 2 (two) times daily. 60 tablet 2   Mepolizumab (NUCALA) 100 MG/ML SOAJ Inject 100 mg into the skin every 28 (twenty-eight) days.     metoCLOPramide (REGLAN) 10 MG tablet Take  10 mg by mouth as needed.     Milnacipran HCl (SAVELLA) 12.5 MG TABS Take 1 tablet (12.5 mg total) by mouth 2 (two) times daily. 60 tablet 1   montelukast (SINGULAIR) 10 MG tablet TAKE 1 TABLET BY MOUTH EVERYDAY AT BEDTIME (Patient taking differently: Take 10 mg by mouth at bedtime.) 30 tablet 0   omeprazole (PRILOSEC) 20 MG capsule Take 1 capsule (20 mg total) by mouth daily. 30 capsule 1   ondansetron (ZOFRAN-ODT) 8 MG disintegrating tablet Take 1 tablet (8 mg total) by mouth every 8 (eight) hours as needed for nausea or vomiting. 20 tablet 5   pantoprazole (PROTONIX) 40 MG tablet TAKE 1 TABLET (40 MG TOTAL) BY MOUTH DAILY. TAKE 30-60 MIN BEFORE FIRST MEAL OF THE DAY (Patient not taking: Reported on 01/28/2022) 30 tablet 2   pregabalin (LYRICA) 200 MG capsule Take 1 capsule (200 mg total) by mouth in the morning, at noon, and at bedtime. 90 capsule 2   QUEtiapine (SEROQUEL) 100 MG tablet Take 100 mg by mouth at bedtime.     revefenacin (YUPELRI) 175 MCG/3ML nebulizer solution Take 3 mLs (175 mcg total) by nebulization daily. 30 mL 0   rizatriptan (MAXALT-MLT) 10 MG disintegrating tablet Take 1 tablet earliest onset of migraine.  May repeat in 2 hours if needed. Maximum 2 tablets in 24 hours (Patient taking differently: Take 10 mg by mouth as needed for migraine. Take 1 tablet earliest onset of migraine.  May repeat in 2 hours if needed. Maximum 2 tablets in 24 hours) 10 tablet 5   sucralfate (CARAFATE) 1 g tablet Take 1 tablet (1 g total) by mouth 4 (four) times daily -  with meals and at bedtime. 120 tablet 1   ursodiol (ACTIGALL) 300 MG capsule Take 300 mg by mouth 2 (two) times daily.     zonisamide (ZONEGRAN) 100 MG capsule Take 1 capsule (100 mg total) by mouth daily. 30 capsule 5   No current facility-administered medications on file prior to visit.    ALLERGIES: Allergies  Allergen Reactions   Ferumoxytol Anaphylaxis    ~5 years ago. Tolerated IV venofer   Phenytoin Anaphylaxis     Other reaction(s): swelling and heart rate decreased   Pecan Nut (Diagnostic) Other (See Comments)   Phenylbutazones Other (See Comments)    FAMILY HISTORY: Family History  Problem Relation Age of Onset   High blood pressure Mother    Rheum arthritis Mother    Diabetes Father    Diabetes Sister    Heart Problems Sister    Diabetes  Brother    Diabetes Paternal Grandmother    Diabetes Other        father's side "everybody died from Diabetes"   High blood pressure Other        mother's side, multiple siblings with this    Heart attack Other        family member on mother's side    Diabetes Paternal Aunt    Seizures Cousin        not sibings to the other cousins with seizures   Breast cancer Cousin    Seizures Cousin        not sibings to the other cousins with seizures   Seizures Cousin        not sibings to the other cousins with seizures   Cervical cancer Maternal Aunt    Breast cancer Cousin    Dementia Maternal Aunt    Asthma Maternal Aunt    Heart Problems Maternal Grandmother    Diabetes Brother    Asthma Daughter    Angioedema Daughter    Asthma Son       Objective:  *** General: No acute distress.  Patient appears well-groomed.   Head:  Normocephalic/atraumatic Eyes:  Fundi examined but not visualized Neck: supple, no paraspinal tenderness, full range of motion Heart:  Regular rate and rhythm Lungs:  Clear to auscultation bilaterally Back: No paraspinal tenderness Neurological Exam: alert and oriented to person, place, and time.  Speech fluent and not dysarthric, language intact.  CN II-XII intact. Bulk and tone normal, muscle strength 5/5 throughout.  Sensation to light touch intact.  Deep tendon reflexes 2+ throughout, toes downgoing.  Finger to nose testing intact.  Gait normal, Romberg negative.   Metta Clines, DO  CC:  Dorna Mai, MD

## 2022-02-06 ENCOUNTER — Ambulatory Visit: Payer: Medicaid Other | Admitting: Neurology

## 2022-02-07 ENCOUNTER — Other Ambulatory Visit: Payer: Self-pay | Admitting: Primary Care

## 2022-02-07 ENCOUNTER — Other Ambulatory Visit: Payer: Self-pay | Admitting: Physical Medicine & Rehabilitation

## 2022-02-07 NOTE — Telephone Encounter (Signed)
Spoke with patient and she does need refills on Lyrica and Savella. Patient states she is only taking the Lyrica twice daily instead of the prescribed 3 times a day. Per PMP, last fill for Lyrica was 12/23/21

## 2022-02-07 NOTE — Telephone Encounter (Signed)
PMP was Reviewed.  Dr Wynn Banker note was reviewed.  Medications e-scribed to pharmacy. Ms. Tilson is aware of the above and verbalizes understanding.

## 2022-02-11 DIAGNOSIS — K259 Gastric ulcer, unspecified as acute or chronic, without hemorrhage or perforation: Secondary | ICD-10-CM | POA: Insufficient documentation

## 2022-02-15 ENCOUNTER — Other Ambulatory Visit: Payer: Self-pay | Admitting: Family Medicine

## 2022-02-17 HISTORY — PX: CHOLECYSTECTOMY: SHX55

## 2022-02-17 HISTORY — PX: LAPAROSCOPIC GASTROTOMY W/ REPAIR OF ULCER: SUR772

## 2022-02-18 ENCOUNTER — Ambulatory Visit: Payer: Medicaid Other | Admitting: Family Medicine

## 2022-02-21 ENCOUNTER — Other Ambulatory Visit (HOSPITAL_COMMUNITY): Payer: Self-pay

## 2022-02-27 ENCOUNTER — Telehealth: Payer: Self-pay

## 2022-02-27 ENCOUNTER — Other Ambulatory Visit (HOSPITAL_COMMUNITY): Payer: Self-pay

## 2022-02-27 NOTE — Progress Notes (Signed)
Office Visit Note  Patient: Elizabeth Bautista             Date of Birth: April 02, 1976           MRN: [id number]             PCP: Georganna Skeans, MD Referring: Georganna Skeans, MD Visit Date: 03/11/2022   Subjective:  Follow-up (Bruising and itching near injection sites and not feeling relief from Enbrel. )   History of Present Illness: Elizabeth Bautista is a 46 y.o. female here for follow up for seropositive RA after starting Enbrel 50 mg [place] weekly  doses so far with 6th due today. So far she sees zero benefit with right knee pain and swelling is still severe. She has bruising and itching around each injection site no improvement trying to ice the area or taking benadryl for the itching.  Previous HPI 01/15/2022 Elizabeth Bautista is a 46 y.o. female here for follow up for seropositive RA on HCQ 400 mg daily. We had previously delayed addition of medication possible Enbrel due to surgery and more recently was recovering from medical issues. She saw Dr. Romeo Apple recently and right knee warmth and effusion present with MRI obtained appearing consistent with active inflammatory arthritis. She is having a lot of pain and stiffness in her right knee and exacerbated with walking and weight bearing.   Previous HPI 11/06/21  Elizabeth Bautista is a 46 y.o. 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 She was last seen almost a year ago with discussion for considering addition of Enbrel due to continued pain and inflammation. This was initially delayed due to anticipating upcoming bariatric surgery. She saw Dr. Wynn Banker for ongoing pain in multiple areas and felt to represent FMS and she started Lyrica with partial improvement in pain. She was hospitalized in February with severe asthma exacerbation including ICU stay. She had the surgery on 5/15 without major complication. She is off her medications since the surgery and has been struggling some with maintaining hydration  status.   Previous HPI 12/25/2020 Elizabeth Bautista is a 46 y.o. 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 She does not feel the medication has controlled symptoms well she has persistent stiffness in her hands and knees in the mornings and knee pain persists throughout the day. Her wrist has decreased in swelling. Her left shoulder is painful with decreased range of movement limited by pain. She has had asthma exacerbations treated with prednisone but currently trying to avoid this due to upcoming bariatric surgery plans.   Previous HPI: 10/11/20 Elizabeth Bautista is a 45 y.o. female here for evaluation and management of rheumatoid arthritis. Symptoms started at least since 2017 with hand, wrist, and knee pain and swelling periodically and worsened over time. She did not seek medication evaluation for some time due to being very busy with work and symptoms progressively increased. She was hospitalized for severe pain and swelling especially in the left wrist and knee and workup at that time was more extensive and revealed positive RA serology suspected as the cause. She saw rheumatology since 2020 and started treatment initial with prednisone that did not control symptoms well but caused a lot of weight gain. She has had chronic steroid exposure due to refractory asthma symptoms over the years. She also started hydroxychloroquine that apparently helped symptoms reasonably well initially but has worsened. She moved form [place] a year ago due  to safety circumstances and has some family in the area and is continuing HCQ treatment but overall having a lot of joint pain. Currently feels swelling and heat in the joints multiple days per week and walks with either a cane or walker for her left knee pain and instability depending on how badly it is acting up. She was recently hospitalized for asthma exacerbation on 4/28 and discharged 5/2.   Labs reviewed 10/2018 RF ~30, CCP  >250 ANA neg   Imaging reviewed Chest xray 08/2020 Cardiomegaly no airspace disease   Lumbar spine xray 08/2020 No acute findings   Review of Systems  Constitutional:  Positive for fatigue.  HENT:  Negative for mouth sores and mouth dryness.   Eyes:  Negative for dryness.  Respiratory:  Positive for shortness of breath.   Cardiovascular:  Negative for chest pain and palpitations.  Gastrointestinal:  Positive for diarrhea. Negative for blood in stool and constipation.  Endocrine: Negative for increased urination.  Genitourinary:  Negative for involuntary urination.  Musculoskeletal:  Positive for joint pain, gait problem, joint pain, joint swelling, myalgias, muscle weakness, morning stiffness, muscle tenderness and myalgias.  Skin:  Positive for rash. Negative for color change, hair loss and sensitivity to sunlight.  Allergic/Immunologic: Negative for susceptible to infections.  Neurological:  Positive for dizziness and headaches.  Hematological:  Negative for swollen glands.  Psychiatric/Behavioral:  Positive for depressed mood and sleep disturbance. The patient is nervous/anxious.     PMFS History:  Patient Active Problem List   Diagnosis Date Noted   Laryngopharyngeal reflux (LPR) 12/26/2021   History of gastric bypass 10/21/2021   Spinal stenosis of lumbar region with neurogenic claudication 08/27/2021   Fall 08/27/2021   Sinusitis 08/11/2021   Allergic rhinitis 08/07/2021   Hospital discharge follow-up 08/02/2021   (HFpEF) heart failure with preserved ejection fraction (HCC) 07/20/2021   Body mass index (BMI) 45.0-49.9, adult (HCC) 02/05/2021   Iron deficiency anemia due to chronic blood loss 10/17/2020   Tension-type headache, not intractable 10/17/2020   High risk medication use 10/11/2020   Acute hip pain, left 09/19/2020   Sciatica 09/11/2020   Dysmenorrhea 06/04/2020   Menorrhagia with irregular cycle 06/04/2020   Anxiety and depression 06/04/2020   Physical  deconditioning 05/24/2020   Severe persistent asthma 03/08/2020   GERD (gastroesophageal reflux disease) 03/07/2020   Anxiety 03/07/2020   History of seizures 03/07/2020   Swelling of lower extremity 02/01/2020   Essential hypertension 02/01/2020   Vitamin D deficiency 02/01/2020   Type 2 diabetes mellitus with hyperglycemia, without long-term current use of insulin (HCC) 02/01/2020   Anemia 02/01/2020   Class 3 severe obesity with serious comorbidity and body mass index (BMI) of 45.0 to 49.9 in adult (HCC) 10/22/2019   OSA on CPAP 10/18/2019   Seropositive rheumatoid arthritis (HCC) 10/18/2019    Past Medical History:  Diagnosis Date   Acute respiratory failure with hypoxia (HCC)    Anemia    Asthma    COVID-19 virus infection 05/16/2020   04/27/20 dx covid-19, not hospitalized   Diabetes (HCC)    on Metformin   Eczema    Edema    Heart problem    "something with the arteries on the left side of the heart"; upcoming appt with cardiology for evaluation   HTN (hypertension)    Rheumatoid arthritis (HCC)    on Plaquenil    Seizures (HCC)    last seizure 2019   Sleep apnea    Sleep apnea  Family History  Problem Relation Age of Onset   High blood pressure Mother    Rheum arthritis Mother    Diabetes Father    Diabetes Sister    Heart Problems Sister    Diabetes Brother    Diabetes Paternal Grandmother    Diabetes Other        father's side "everybody died from Diabetes"   High blood pressure Other        mother's side, multiple siblings with this    Heart attack Other        family member on mother's side    Diabetes Paternal Aunt    Seizures Cousin        not sibings to the other cousins with seizures   Breast cancer Cousin    Seizures Cousin        not sibings to the other cousins with seizures   Seizures Cousin        not sibings to the other cousins with seizures   Cervical cancer Maternal Aunt    Breast cancer Cousin    Dementia Maternal Aunt     Asthma Maternal Aunt    Heart Problems Maternal Grandmother    Diabetes Brother    Asthma Daughter    Angioedema Daughter    Asthma Son    Past Surgical History:  Procedure Laterality Date   APPENDECTOMY     CESAREAN SECTION     x3   CHOLECYSTECTOMY  02/17/2022   GASTRIC BYPASS     LAPAROSCOPIC GASTROTOMY W/ REPAIR OF ULCER  02/17/2022   Social History   Social History Narrative   Divorced.Lives with 3 kids.Originally from [place].Came from [place] 7 months ago.      02/27/2020   Right handed   Caffeine: none     There is no immunization history on file for this patient.   Objective: Vital Signs: BP 124/84 (BP Location: Left Arm, Patient Position: Sitting, Cuff Size: Normal)   Pulse 67   Resp 15   Ht 5\' 4"  (1.626 m)   Wt 194 lb (88 kg)   LMP 02/23/2022   BMI 33.30 kg/m    Physical Exam Constitutional:      Appearance: She is obese.  Cardiovascular:     Rate and Rhythm: Normal rate and regular rhythm.  Pulmonary:     Effort: Pulmonary effort is normal.     Breath sounds: Normal breath sounds.  Musculoskeletal:     Comments: Trace pedal edema  Skin:    General: Skin is warm and dry.  Neurological:     Mental Status: She is alert.  Psychiatric:        Mood and Affect: Mood normal.      Musculoskeletal Exam:  Shoulders full ROM no tenderness or swelling Elbows full ROM no tenderness or swelling Wrists full ROM no tenderness or swelling Fingers full ROM no tenderness or swelling Right knee swelling, tender to pressure on all sides and with active or passive ROM Ankles full ROM no tenderness or swelling   CDAI Exam: CDAI Score: 12  Patient Global: 60 mm; Provider Global: 40 mm Swollen: 1 ; Tender: 1  Joint Exam 03/11/2022      Right  Left  Knee  Swollen Tender        Investigation: No additional findings.  Imaging: No results found.  Recent Labs: Lab Results  Component Value Date   WBC 7.6 01/15/2022   HGB 11.1 (L) 01/15/2022   PLT  289  01/15/2022   NA 139 01/15/2022   K 3.9 01/15/2022   CL 103 01/15/2022   CO2 26 01/15/2022   GLUCOSE 79 01/15/2022   BUN 5 (L) 01/15/2022   CREATININE 0.45 (L) 01/15/2022   BILITOT 0.5 01/15/2022   ALKPHOS 72 11/09/2021   AST 12 01/15/2022   ALT 8 01/15/2022   PROT 6.1 01/15/2022   ALBUMIN 3.7 11/09/2021   CALCIUM 8.9 01/15/2022   GFRAA 121 10/11/2020   QFTBGOLDPLUS NEGATIVE 01/15/2022    Speciality Comments: PLQ EYE EXAM 09/12/2021 [place] f/u 2-3 months Enbrel started 01/29/11  Procedures:  No procedures performed Allergies: Ferumoxytol, Phenytoin, Pecan nut (diagnostic), and Phenylbutazones   Assessment / Plan:     Visit Diagnoses: Seropositive rheumatoid arthritis (HCC) - Plan: Sedimentation rate, C-reactive protein  So far main complaint of right knee pain, swelling, and stiffness without any noticeable improvement after starting Enbrel 50 mg  weekly. Local reactions to injections but no systemic problem. Will check sed rate and CRP today see if serologic markers for disease activity improving. If improving I recommend she continue at least a few more weeks for adequate trial versus considering switching class. If symptoms show improvement but having continued reactions can try alternate TNF agent. She still wants to avoid resuming any prednisone treatment with recent bariatric surgery and weight loss.  High risk medication use - Plan: CBC with Differential/Platelet, COMPLETE METABOLIC PANEL WITH GFR  Checking CBC and CMP for Enbrel and HCQ medication monitoring. Local reactions at Enbrel injections throughout right thigh. Recommend trying site rotation if this helps. So far not much better with icing and benadryl treatments. No evidence of systemic reaction.  Orders: Orders Placed This Encounter  Procedures   Sedimentation rate   C-reactive protein   CBC with Differential/Platelet   COMPLETE METABOLIC PANEL WITH GFR   Meds ordered this encounter  Medications    hydroxychloroquine (PLAQUENIL) 200 MG tablet    Sig: Take 1 tablet (200 mg total) by mouth 2 (two) times daily.    Dispense:  60 tablet    Refill:  2     Follow-Up Instructions: Return in about 3 months (around 06/11/2022) for RA on ENB f/u 8mos.   Fuller Plan, MD  Note - This record has been created using [employer].  Chart creation errors have been sought, but may not always  have been located. Such creation errors do not reflect on  the standard of medical care.

## 2022-02-27 NOTE — Telephone Encounter (Signed)
Patient called and left a message stating she is having bruising on her legs near Enbrel injection sites and is concerned about that. Please advise, thanks!

## 2022-03-03 NOTE — Telephone Encounter (Signed)
FYI- I spoke with Elizabeth Bautista she is taken 3 doses of the Enbrel now and reports developing a bruise around the injection site of each area.  Still has the hyperpigmented discoloration around the first spot.  These are associated with some itching.  She is not having any swelling rashes or symptoms elsewhere on her body.  She is due for the next dose of Enbrel tomorrow.  I recommend applying ice or otherwise cold to the area for about 15 to 20 minutes after she is taken her injection to help prevent any local bruising or swelling.  Also recommended trial of taking an oral OTC antihistamine to see if this prevents the itching side effects.  Plan to check back into follow-up if there is any improvement this week.

## 2022-03-03 NOTE — Telephone Encounter (Signed)
Will call patient on Thursday to assess response to injection site reaction strategies mentioned by Dr. Tristan Schroeder, PharmD, MPH, BCPS, CPP Clinical Pharmacist (Rheumatology and Pulmonology)

## 2022-03-04 ENCOUNTER — Other Ambulatory Visit: Payer: Self-pay | Admitting: Internal Medicine

## 2022-03-04 DIAGNOSIS — L309 Dermatitis, unspecified: Secondary | ICD-10-CM

## 2022-03-06 ENCOUNTER — Telehealth: Payer: Self-pay | Admitting: Internal Medicine

## 2022-03-06 NOTE — Telephone Encounter (Signed)
Returned call to patient. She completed her Enbrel injection on 03/04/22. Followed recommendations listed by Dr. Benjamine Mola on 03/03/22.  She states the OTC antihistamine did help with itchiness around injection site but did not resolve the ongoing pain around injection site. She also reports that bruising is present despite icing the area. She took a picture which she will bring to next OV  Of note - patient states she has not been alternating legs for injections. She has been using different areas in same legs for injection  She has f/u on 03/11/22 with Dr. Benjamine Mola and has been advised to hold Enbrel that day since she would be due that day. Advised her to show Dr. Benjamine Mola picture at [place].  Knox Saliva, PharmD, MPH, BCPS, CPP Clinical Pharmacist (Rheumatology and Pulmonology)

## 2022-03-06 NOTE — Telephone Encounter (Signed)
Reached out to patient to determine if any improvement with Enbrel injection after following Dr. Marveen Reeks recommendations. Unable to reach. Left VM requesting return call  Knox Saliva, PharmD, MPH, BCPS, CPP Clinical Pharmacist (Rheumatology and Pulmonology)

## 2022-03-06 NOTE — Telephone Encounter (Signed)
Patient called the office requesting a call back from [facility].

## 2022-03-06 NOTE — Telephone Encounter (Signed)
Thanks

## 2022-03-07 ENCOUNTER — Ambulatory Visit (INDEPENDENT_AMBULATORY_CARE_PROVIDER_SITE_OTHER): Payer: Medicaid Other | Admitting: Family Medicine

## 2022-03-07 ENCOUNTER — Encounter: Payer: Self-pay | Admitting: Family Medicine

## 2022-03-07 VITALS — BP 122/82 | HR 86 | Temp 98.3°F | Resp 18

## 2022-03-07 DIAGNOSIS — J4550 Severe persistent asthma, uncomplicated: Secondary | ICD-10-CM | POA: Diagnosis not present

## 2022-03-07 MED ORDER — TRELEGY ELLIPTA 200-62.5-25 MCG/ACT IN AEPB: 1.0000 | INHALATION_SPRAY | Freq: Every day | RESPIRATORY_TRACT | 5 refills | Status: DC

## 2022-03-07 MED ORDER — FLUTICASONE PROPIONATE 50 MCG/ACT NA SUSP: NASAL | 5 refills | Status: DC

## 2022-03-07 MED ORDER — MONTELUKAST SODIUM 10 MG PO TABS: ORAL_TABLET | ORAL | 5 refills | Status: DC

## 2022-03-07 NOTE — Patient Instructions (Addendum)
Asthma Continue montelukast 10 mg once a day to prevent cough or wheeze Continue Trelegy 200-1 puff once a day to prevent cough or wheeze You may use albuterol 2 puffs once every 4 hours as needed for cough or wheeze You may use albuterol 2 puffs 5 to 15 minutes before activity to decrease cough or wheeze Continue Nucala injections once a month to control asthma symptoms For now and for asthma flare, add Flovent 110-2 puffs twice a day with a spacer for 1-2 weeks or until cough and wheeze free Call the clinic if your asthma symptoms worsen or if they do not improve  Allergic rhinitis Continue allergen avoidance measures directed toward pollen, mold, dust mite, cat, and cockroach as listed below Continue Xyzal 5 mg once a day as needed for runny nose or itch Continue Flonase 2 sprays in each nostril once a day as needed for nasal congestion.  In the right nostril, point the applicator out toward the right ear. In the left nostril, point the applicator out toward the left ear Continue azelastine 2 sprays in each nostril  1-2 times a day as needed for nasal symptoms Consider saline nasal rinses as needed for nasal symptoms. Use this before any medicated nasal sprays for best result Consider allergen immunotherapy if your symptoms are not well controlled with the treatment plan as listed above. Your asthma must be well controlled before beginning allergy injections  Allergic conjunctivitis Some over the counter eye drops include Pataday one drop in each eye once a day as needed for red, itchy eyes OR Zaditor one drop in each eye twice a day as needed for red itchy eyes.  Reflux Continue dietary and lifestyle modifications as listed below Continue to follow-up with your gastroenterologist specialist  Call the clinic if this treatment plan is not working well for you  Follow up in 2 months or sooner if needed.  Reducing Pollen Exposure The American Academy of Allergy, Asthma and Immunology  suggests the following steps to reduce your exposure to pollen during allergy seasons. Do not hang sheets or clothing out to dry; pollen may collect on these items. Do not mow lawns or spend time around freshly cut grass; mowing stirs up pollen. Keep windows closed at night.  Keep car windows closed while driving. Minimize morning activities outdoors, a time when pollen counts are usually at their highest. Stay indoors as much as possible when pollen counts or humidity is high and on windy days when pollen tends to remain in the air longer. Use air conditioning when possible.  Many air conditioners have filters that trap the pollen spores. Use a HEPA room air filter to remove pollen form the indoor air you breathe.   Control of Mold Allergen Mold and fungi can grow on a variety of surfaces provided certain temperature and moisture conditions exist.  Outdoor molds grow on plants, decaying vegetation and soil.  The major outdoor mold, Alternaria and Cladosporium, are found in very high numbers during hot and dry conditions.  Generally, a late Summer - Fall peak is seen for common outdoor fungal spores.  Rain will temporarily lower outdoor mold spore count, but counts rise rapidly when the rainy period ends.  The most important indoor molds are Aspergillus and Penicillium.  Dark, humid and poorly ventilated basements are ideal sites for mold growth.  The next most common sites of mold growth are the bathroom and the kitchen.  Outdoor [employer] Use air conditioning and keep windows closed Avoid exposure  to decaying vegetation. Avoid leaf raking. Avoid grain handling. Consider wearing a face mask if working in moldy areas.  Indoor Mold Control Maintain humidity below 50%. Clean washable surfaces with 5% bleach solution. Remove sources e.g. Contaminated carpets.  Control of Dog or Cat Allergen Avoidance is the best way to manage a dog or cat allergy. If you have a dog or cat and are allergic  to dog or cats, consider removing the dog or cat from the home. If you have a dog or cat but don't want to find it a new home, or if your family wants a pet even though someone in the household is allergic, here are some strategies that may help keep symptoms at bay:  Keep the pet out of your bedroom and restrict it to only a few rooms. Be advised that keeping the dog or cat in only one room will not limit the allergens to that room. Don't pet, hug or kiss the dog or cat; if you do, wash your hands with soap and water. High-efficiency particulate air (HEPA) cleaners run continuously in a bedroom or living room can reduce allergen levels over time. Regular use of a high-efficiency vacuum cleaner or a central vacuum can reduce allergen levels. Giving your dog or cat a bath at least once a week can reduce airborne allergen.   Control of Dust Mite Allergen Dust mites play a major role in allergic asthma and rhinitis. They occur in environments with high humidity wherever human skin is found. Dust mites absorb humidity from the atmosphere (ie, they do not drink) and feed on organic matter (including shed human and animal skin). Dust mites are a microscopic type of insect that you cannot see with the naked eye. High levels of dust mites have been detected from mattresses, pillows, carpets, upholstered furniture, bed covers, clothes, soft toys and any woven material. The principal allergen of the dust mite is found in its feces. A gram of dust may contain 1,000 mites and 250,000 fecal particles. Mite antigen is easily measured in the air during house cleaning activities. Dust mites do not bite and do not cause harm to humans, other than by triggering allergies/asthma.  Ways to decrease your exposure to dust mites in your home:  1. Encase mattresses, box springs and pillows with a mite-impermeable barrier or cover  2. Wash sheets, blankets and drapes weekly in hot water (130 F) with detergent and dry them  in a dryer on the hot setting.  3. Have the room cleaned frequently with a vacuum cleaner and a damp dust-mop. For carpeting or rugs, vacuuming with a vacuum cleaner equipped with a high-efficiency particulate air (HEPA) filter. The dust mite allergic individual should not be in a room which is being cleaned and should wait 1 hour after cleaning before going into the room.  4. Do not sleep on upholstered furniture (eg, couches).  5. If possible removing carpeting, upholstered furniture and drapery from the home is ideal. Horizontal blinds should be eliminated in the rooms where the person spends the most time (bedroom, study, television room). Washable vinyl, roller-type shades are optimal.  6. Remove all non-washable stuffed toys from the bedroom. Wash stuffed toys weekly like sheets and blankets above.  7. Reduce indoor humidity to less than 50%. Inexpensive humidity monitors can be purchased at most hardware stores. Do not use a humidifier as can make the problem worse and are not recommended.  Control of Cockroach Allergen Cockroach allergen has been identified as an  important cause of acute attacks of asthma, especially in urban settings.  There are fifty-five species of cockroach that exist in the [place], however only three, the [place], [place] species produce allergen that can affect patients with Asthma.  Allergens can be obtained from fecal particles, egg casings and secretions from cockroaches.    Remove food sources. Reduce access to water. Seal access and entry points. Spray runways with 0.5-1% Diazinon or Chlorpyrifos Blow boric acid power under stoves and refrigerator. Place bait stations (hydramethylnon) at feeding sites.

## 2022-03-07 NOTE — Progress Notes (Signed)
[place], [address] [place] [address] Dept: [phone]  FOLLOW UP NOTE  Patient ID: [place], female    DOB: November 02, 1975  Age: 46 y.o. MRN: [id number] Date of Office Visit: 03/07/2022  Assessment  Chief Complaint: Asthma (Has had cough,wheeze,chest tightness, SOB. Has used albuterol frequently since her last visit) and Allergic Rhinitis  (Allergies are the same. Sneezing, red/watery eyes )  HPI [place] is a 46 year old female who presents the clinic for follow-up visit.  She was last seen in this clinic on 09/04/2021 by Dr. Ernst Bowler for evaluation of asthma, allergic rhinitis, and reflux.  Her medical history is significant for COVID 19 infection in 2021, intubation due to asthma flare, bariatric surgery, and rheumatoid arthritis for which she takes Plaquenil.  At today's visit, she reports her asthma has been poorly controlled over the last 2 months with symptoms including shortness of breath, cough producing mucus and dry cough, and wheeze.  She reports the symptoms vary and are frequently influenced by the weather change.  She continues Trelegy 200-1 puff once a day, montelukast 10 mg once a day, and frequently uses Xopenex for relief of symptoms.  She continues Nucala injections at home once every 4 weeks with no large or local reactions.  She reports a significant decrease in her symptoms of asthma while continuing on Nucala injections.  She denies fever, sweats, chills, and sick contacts.  Allergic rhinitis is reported as moderately well controlled with symptoms including clear rhinorrhea after using nasal medications, sneezing year-round, and occasional postnasal drainage.  She continues Xyzal 5 mg once a day [employer] and azelastine as needed.  Reflux is reported as moderately well controlled with Protonix 40 mg once a day.  She reports her symptoms of reflux have decreased significantly since beginning Protonix.  Her current medications are listed in  the chart.   Drug Allergies:  Allergies  Allergen Reactions   Ferumoxytol Anaphylaxis    ~5 years ago. Tolerated IV venofer   Phenytoin Anaphylaxis    Other reaction(s): swelling and heart rate decreased   Pecan Nut (Diagnostic) Other (See Comments)   Phenylbutazones Other (See Comments)    Physical Exam: BP 122/82   Pulse 86   Temp 98.3 F (36.8 C)   Resp 18   SpO2 98%    Physical Exam Vitals reviewed.  Constitutional:      Appearance: Normal appearance.  HENT:     Head: Normocephalic and atraumatic.     Right Ear: Tympanic membrane normal.     Left Ear: Tympanic membrane normal.     Nose:     Comments: Bilateral nares edematous and pale with clear nasal drainage noted.  Pharynx normal.  Ears normal.  Eyes normal.    Mouth/Throat:     Pharynx: Oropharynx is clear.  Eyes:     Conjunctiva/sclera: Conjunctivae normal.  Cardiovascular:     Rate and Rhythm: Normal rate and regular rhythm.     Heart sounds: Normal heart sounds. No murmur heard. Pulmonary:     Effort: Pulmonary effort is normal.     Breath sounds: Normal breath sounds.     Comments: Lungs clear to auscultation Musculoskeletal:        General: Normal range of motion.     Cervical back: Normal range of motion and neck supple.  Skin:    General: Skin is warm.  Neurological:     Mental Status: She is alert and oriented to person, place, and time.  Psychiatric:  Mood and Affect: Mood normal.        Behavior: Behavior normal.        Thought Content: Thought content normal.        Judgment: Judgment normal.     Diagnostics: FVC 2.33, FEV1 1.92. Predicted FVC 3.00, predicted FEV1 2.43. Spirometry indicates normal ventilatory function.  Assessment and Plan: 1. Severe persistent asthma, uncomplicated     Meds ordered this encounter  Medications   DISCONTD: Fluticasone-Umeclidin-Vilant (TRELEGY ELLIPTA) 200-62.5-25 MCG/ACT AEPB    Sig: Inhale 1 puff into the lungs daily.    Dispense:  28 each     Refill:  5   DISCONTD: montelukast (SINGULAIR) 10 MG tablet    Sig: TAKE 1 TABLET BY MOUTH EVERYDAY AT BEDTIME Strength: 10 mg    Dispense:  30 tablet    Refill:  5   fluticasone (FLONASE) 50 MCG/ACT nasal spray    Sig: 2 sprays in each nostril once a day as needed for nasal congestion    Dispense:  16 g    Refill:  5   DISCONTD: Fluticasone-Umeclidin-Vilant (TRELEGY ELLIPTA) 200-62.5-25 MCG/ACT AEPB    Sig: Inhale 1 puff into the lungs daily.    Dispense:  28 each    Refill:  5   montelukast (SINGULAIR) 10 MG tablet    Sig: TAKE 1 TABLET BY MOUTH EVERYDAY AT BEDTIME Strength: 10 mg    Dispense:  30 tablet    Refill:  5   Fluticasone-Umeclidin-Vilant (TRELEGY ELLIPTA) 200-62.5-25 MCG/ACT AEPB    Sig: Inhale 1 puff into the lungs daily.    Dispense:  28 each    Refill:  5    Patient Instructions  Asthma Continue montelukast 10 mg once a day to prevent cough or wheeze Continue Trelegy 200-1 puff once a day to prevent cough or wheeze You may use albuterol 2 puffs once every 4 hours as needed for cough or wheeze You may use albuterol 2 puffs 5 to 15 minutes before activity to decrease cough or wheeze Continue Nucala injections once a month to control asthma symptoms For now and for asthma flare, add Flovent 110 2 puffs twice a day with a spacer for 1-2 weeks or until cough and wheeze free Call the clinic if your asthma symptoms worsen or if they do not improve  Allergic rhinitis Continue allergen avoidance measures directed toward pollen, mold, dust mite, cat, and cockroach as listed below Continue Xyzal 5 mg once a day as needed for runny nose or itch Continue Flonase 2 sprays in each nostril once a day as needed for nasal congestion.  In the right nostril, point the applicator out toward the right ear. In the left nostril, point the applicator out toward the left ear Continue azelastine 2 sprays in each nostril  1-2 times a day as needed for nasal symptoms Consider saline  nasal rinses as needed for nasal symptoms. Use this before any medicated nasal sprays for best result Consider allergen immunotherapy if your symptoms are not well controlled with the treatment plan as listed above. Your asthma must be well controlled before beginning allergy injections  Allergic conjunctivitis Some over the counter eye drops include Pataday one drop in each eye once a day as needed for red, itchy eyes OR Zaditor one drop in each eye twice a day as needed for red itchy eyes.  Reflux Continue dietary and lifestyle modifications as listed below Continue to follow-up with your gastroenterologist specialist  Call the clinic if this treatment  plan is not working well for you  Follow up in 2 months or sooner if needed.   Return in about 2 months (around 05/07/2022), or if symptoms worsen or fail to improve.    Thank you for the opportunity to care for this patient.  Please do not hesitate to contact me with questions.  Gareth Morgan, FNP [facility]

## 2022-03-08 ENCOUNTER — Encounter: Payer: Self-pay | Admitting: Family Medicine

## 2022-03-10 ENCOUNTER — Telehealth: Payer: Self-pay

## 2022-03-10 NOTE — Telephone Encounter (Addendum)
-----   Message from Dara Hoyer, FNP sent at 03/08/2022  2:03 PM EDT ----- Can you please call and check on how this patient is breathing?  Please ask if her Trelegy was available at her pharmacy.  Thank you  Called patient - DPR verified - LMOVM to contact office via myChart or by phone regarding the above notation.  Forwarding message to provider as update.

## 2022-03-11 ENCOUNTER — Ambulatory Visit: Payer: Medicaid Other | Attending: Internal Medicine | Admitting: Internal Medicine

## 2022-03-11 ENCOUNTER — Encounter: Payer: Self-pay | Admitting: Internal Medicine

## 2022-03-11 VITALS — BP 124/84 | HR 67 | Resp 15 | Ht 64.0 in | Wt 194.0 lb

## 2022-03-11 DIAGNOSIS — M069 Rheumatoid arthritis, unspecified: Secondary | ICD-10-CM | POA: Diagnosis present

## 2022-03-11 DIAGNOSIS — M25561 Pain in right knee: Secondary | ICD-10-CM | POA: Diagnosis present

## 2022-03-11 DIAGNOSIS — Z79899 Other long term (current) drug therapy: Secondary | ICD-10-CM | POA: Diagnosis present

## 2022-03-11 DIAGNOSIS — M059 Rheumatoid arthritis with rheumatoid factor, unspecified: Secondary | ICD-10-CM | POA: Insufficient documentation

## 2022-03-11 MED ORDER — HYDROXYCHLOROQUINE SULFATE 200 MG PO TABS: 200.0000 mg | ORAL_TABLET | Freq: Two times a day (BID) | ORAL | 2 refills | Status: DC

## 2022-03-12 LAB — COMPLETE METABOLIC PANEL WITH GFR
AG Ratio: 1.2 (calc) (ref 1.0–2.5)
ALT: 13 U/L (ref 6–29)
AST: 17 U/L (ref 10–35)
Albumin: 3.8 g/dL (ref 3.6–5.1)
Alkaline phosphatase (APISO): 96 U/L (ref 31–125)
BUN: 8 mg/dL (ref 7–25)
CO2: 25 mmol/L (ref 20–32)
Calcium: 9 mg/dL (ref 8.6–10.2)
Chloride: 102 mmol/L (ref 98–110)
Creat: 0.51 mg/dL (ref 0.50–0.99)
Globulin: 3.3 g/dL (calc) (ref 1.9–3.7)
Glucose, Bld: 74 mg/dL (ref 65–99)
Potassium: 3.1 mmol/L — ABNORMAL LOW (ref 3.5–5.3)
Sodium: 140 mmol/L (ref 135–146)
Total Bilirubin: 0.6 mg/dL (ref 0.2–1.2)
Total Protein: 7.1 g/dL (ref 6.1–8.1)
eGFR: 116 mL/min/{1.73_m2} (ref >=60)

## 2022-03-12 LAB — CBC WITH DIFFERENTIAL/PLATELET
Absolute Monocytes: 351 cells/uL (ref 200–950)
Basophils Absolute: 39 cells/uL (ref 0–200)
Basophils Relative: 0.6 %
Eosinophils Absolute: 228 cells/uL (ref 15–500)
Eosinophils Relative: 3.5 %
HCT: 40.4 % (ref 35.0–45.0)
Hemoglobin: 13.1 g/dL (ref 11.7–15.5)
Lymphs Abs: 1703 cells/uL (ref 850–3900)
MCH: 26.6 pg — ABNORMAL LOW (ref 27.0–33.0)
MCHC: 32.4 g/dL (ref 32.0–36.0)
MCV: 81.9 fL (ref 80.0–100.0)
MPV: 11.6 fL (ref 7.5–12.5)
Monocytes Relative: 5.4 %
Neutro Abs: 4180 cells/uL (ref 1500–7800)
Neutrophils Relative %: 64.3 %
Platelets: 240 10*3/uL (ref 140–400)
RBC: 4.93 10*6/uL (ref 3.80–5.10)
RDW: 17.3 % — ABNORMAL HIGH (ref 11.0–15.0)
Total Lymphocyte: 26.2 %
WBC: 6.5 10*3/uL (ref 3.8–10.8)

## 2022-03-12 LAB — C-REACTIVE PROTEIN: CRP: 6.2 mg/L (ref <8.0)

## 2022-03-12 LAB — SEDIMENTATION RATE: Sed Rate: 14 mm/h (ref 0–20)

## 2022-03-12 NOTE — Progress Notes (Signed)
Lab results look okay for continuing the Enbrel. Her inflammation markers the sed rate and CRP are both decreased since starting the medication so I recommend she try sticking with it at least 3 more weeks since symptoms may improve more. She can let us know after that if not improving and we can start trying to switch treatments in that case. I also recommend she try alternating the injection site to her left leg as discussed.

## 2022-03-18 ENCOUNTER — Other Ambulatory Visit (HOSPITAL_COMMUNITY): Payer: Self-pay

## 2022-03-20 ENCOUNTER — Other Ambulatory Visit: Payer: Self-pay | Admitting: Family Medicine

## 2022-03-20 ENCOUNTER — Other Ambulatory Visit (HOSPITAL_COMMUNITY): Payer: Self-pay

## 2022-03-20 NOTE — Telephone Encounter (Signed)
Requested medication (s) are due for refill today: no  Requested medication (s) are on the active medication list: yes  Last refill:  02/15/22 #30 1 RF  Future visit scheduled: no  Notes to clinic:  pt canceled follow up- pharmacy requesting 90 day refills.   Requested Prescriptions  Pending Prescriptions Disp Refills   omeprazole (PRILOSEC) 20 MG capsule [Pharmacy Med Name: OMEPRAZOLE DR 20 MG CAPSULE] 90 capsule 1    Sig: TAKE 1 CAPSULE BY MOUTH EVERY DAY     Gastroenterology: Proton Pump Inhibitors Passed - 03/20/2022  9:33 AM      Passed - Valid encounter within last 12 months    Recent Outpatient Visits           1 month ago Gastroesophageal reflux disease without esophagitis   [facility] at [facility], MD       Future Appointments             In 2 months Rice, Resa Miner, MD [facility] [place] [place]   In 6 months Ralene Bathe, MD [place]

## 2022-03-21 ENCOUNTER — Encounter: Payer: Medicaid Other | Attending: Physical Medicine & Rehabilitation | Admitting: Physical Medicine and Rehabilitation

## 2022-03-21 VITALS — BP 122/84 | HR 85 | Ht 64.0 in | Wt 190.0 lb

## 2022-03-21 DIAGNOSIS — Z6832 Body mass index (BMI) 32.0-32.9, adult: Secondary | ICD-10-CM

## 2022-03-21 DIAGNOSIS — M797 Fibromyalgia: Secondary | ICD-10-CM | POA: Diagnosis present

## 2022-03-21 DIAGNOSIS — E669 Obesity, unspecified: Secondary | ICD-10-CM

## 2022-03-21 NOTE — Addendum Note (Signed)
Addended by: Izora Ribas on: 03/21/2022 09:58 AM   Modules accepted: Orders
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
## 2022-03-21 NOTE — Patient Instructions (Signed)
Fibromyalgia is a clinical syndrome characterized by widespread pain and tenderness in addition to a variety of symptoms, including fatigue, anxiety, depression, and sleep disturbances. Fibromyalgia affects 3-5% of women and up to 25% of women with other rheumatological conditions.   Exercise is a first-line treatment for the disease, and can help with both the physical and emotional symptoms, as well as improving overall health and function.   

## 2022-03-25 ENCOUNTER — Encounter: Payer: Self-pay | Admitting: Physical Medicine and Rehabilitation

## 2022-03-26 ENCOUNTER — Other Ambulatory Visit: Payer: Self-pay | Admitting: Physical Medicine and Rehabilitation

## 2022-03-26 MED ORDER — TOPIRAMATE 25 MG PO TABS: 25.0000 mg | ORAL_TABLET | Freq: Every evening | ORAL | 3 refills | Status: DC

## 2022-04-03 ENCOUNTER — Other Ambulatory Visit (HOSPITAL_COMMUNITY): Payer: Self-pay

## 2022-04-03 ENCOUNTER — Other Ambulatory Visit: Payer: Self-pay | Admitting: Internal Medicine

## 2022-04-03 DIAGNOSIS — L309 Dermatitis, unspecified: Secondary | ICD-10-CM

## 2022-04-09 ENCOUNTER — Telehealth: Payer: Self-pay | Admitting: Internal Medicine

## 2022-04-09 ENCOUNTER — Telehealth: Payer: Self-pay | Admitting: Rheumatology

## 2022-04-09 NOTE — Telephone Encounter (Signed)
ERROR

## 2022-04-09 NOTE — Telephone Encounter (Signed)
Patient called the office stating she has a broken Enbrel device and medication. Patient states she contacted [employer] and they are unsure if it was a faulty batch of medication so they need both a new RX for the medication and device.

## 2022-04-09 NOTE — Telephone Encounter (Signed)
Faxed request back to [employer]

## 2022-04-10 ENCOUNTER — Other Ambulatory Visit (HOSPITAL_COMMUNITY): Payer: Self-pay

## 2022-04-14 ENCOUNTER — Other Ambulatory Visit (HOSPITAL_COMMUNITY): Payer: Self-pay

## 2022-04-15 ENCOUNTER — Telehealth: Payer: Self-pay | Admitting: Pulmonary Disease

## 2022-04-15 DIAGNOSIS — J4540 Moderate persistent asthma, uncomplicated: Secondary | ICD-10-CM

## 2022-04-15 MED ORDER — ALBUTEROL SULFATE HFA 108 (90 BASE) MCG/ACT IN AERS: INHALATION_SPRAY | RESPIRATORY_TRACT | 11 refills | Status: DC

## 2022-04-15 MED ORDER — IPRATROPIUM-ALBUTEROL 0.5-2.5 (3) MG/3ML IN SOLN: 3.0000 mL | Freq: Four times a day (QID) | RESPIRATORY_TRACT | 6 refills | Status: DC | PRN

## 2022-04-15 MED ORDER — AZELASTINE HCL 0.1 % NA SOLN: 1.0000 | Freq: Two times a day (BID) | NASAL | 12 refills | Status: DC

## 2022-04-15 MED ORDER — PANTOPRAZOLE SODIUM 40 MG PO TBEC: 40.0000 mg | DELAYED_RELEASE_TABLET | Freq: Every day | ORAL | 2 refills | Status: DC

## 2022-04-15 MED ORDER — FAMOTIDINE 20 MG PO TABS: 20.0000 mg | ORAL_TABLET | Freq: Every day | ORAL | 2 refills | Status: AC

## 2022-04-15 MED ORDER — TRELEGY ELLIPTA 200-62.5-25 MCG/ACT IN AEPB: 1.0000 | INHALATION_SPRAY | Freq: Every day | RESPIRATORY_TRACT | 5 refills | Status: DC

## 2022-04-15 NOTE — Telephone Encounter (Signed)
Called patient. Patient is asking for refills of trelegy, albuterol inhaler, duoneb, protonix, pepcid and nasal spray to be sent to [facility] [address] Refills sent. Nothing further needed

## 2022-04-16 ENCOUNTER — Other Ambulatory Visit (HOSPITAL_COMMUNITY): Payer: Self-pay

## 2022-04-16 ENCOUNTER — Other Ambulatory Visit: Payer: Self-pay | Admitting: Internal Medicine

## 2022-04-16 DIAGNOSIS — Z79899 Other long term (current) drug therapy: Secondary | ICD-10-CM

## 2022-04-16 DIAGNOSIS — M059 Rheumatoid arthritis with rheumatoid factor, unspecified: Secondary | ICD-10-CM

## 2022-04-16 NOTE — Telephone Encounter (Signed)
Next Visit: 06/11/2022  Last Visit: 03/11/2022  Last Fill: 01/28/2022   VA:POLIDCVUDTHY rheumatoid arthritis   Current Dose per office note 03/11/2022: Enbrel 50 mg Maddock weekly.   Labs: 03/11/2022 Lab results look okay for continuing the Enbrel   TB Gold: 01/15/2022 Neg    Okay to refill Enbrel?

## 2022-04-17 ENCOUNTER — Other Ambulatory Visit (HOSPITAL_COMMUNITY): Payer: Self-pay

## 2022-04-17 MED ORDER — ENBREL MINI 50 MG/ML ~~LOC~~ SOCT
50.0000 mg | SUBCUTANEOUS | 0 refills | Status: DC
  Filled 2022-04-17 – 2022-04-18 (×2): qty 4, 28d supply, fill #0
  Filled 2022-05-14: qty 4, 28d supply, fill #1
  Filled 2022-06-19: qty 4, 28d supply, fill #2

## 2022-04-18 ENCOUNTER — Other Ambulatory Visit (HOSPITAL_COMMUNITY): Payer: Self-pay

## 2022-04-18 ENCOUNTER — Ambulatory Visit: Payer: Self-pay

## 2022-04-18 NOTE — Telephone Encounter (Signed)
  Chief Complaint: dizziness Symptoms: dizziness, blurred vision, flushed, feeling like passing out at times Frequency: 1 week has been worse than usual Pertinent Negatives: Patient denies fainting Disposition: [] ED /[] Urgent Care (no appt availability in office) / [x] Appointment(In office/virtual)/ []  [facility]/ [] Home Care/ [] Refused Recommended Disposition /[] [facility]/ []  Follow-up with PCP Additional Notes: pt states that the dizziness has been going on for some time now but the last week has gotten worse to where it doesn't matter her position she has the dizziness and now blurred vision and feeling like passing out at times as well. I recommended pt go to UC d/t no appts today but pt states d/t transportation issues she has to give 4 day notice. Appt scheduled with , PA on 04/23/22 at 0800. Pt asked that we call to set up transportation since this is what has been done in past. I advised pt if transportation doesn't work out with appt would give her a call back. Pt verbalized understanding.   Transportation # ext [phone] (out of county[place]    Reason for Disposition  [1] MODERATE dizziness (e.g., interferes with normal activities) AND [2] has NOT been evaluated by doctor (or NP/PA) for this  (Exception: Dizziness caused by heat exposure, sudden standing, or poor fluid intake.)  Answer Assessment - Initial Assessment Questions 1. DESCRIPTION: "Describe your dizziness."     Dizziness and feeling like going to pass out  2. LIGHTHEADED: "Do you feel lightheaded?" (e.g., somewhat faint, woozy, weak upon standing)     Woozy and unsteady on feet  4. SEVERITY: "How bad is it?"  "Do you feel like you are going to faint?" "Can you stand and walk?"   - MILD: Feels slightly dizzy, but walking normally.   - MODERATE: Feels unsteady when walking, but not falling; interferes with normal activities (e.g., school, work).   - SEVERE: Unable to walk without falling, or  requires assistance to walk without falling; feels like passing out now.      moderate 5. ONSET:  "When did the dizziness begin?"     Last week getting worse  6. AGGRAVATING FACTORS: "Does anything make it worse?" (e.g., standing, change in head position)     Standing or sitting  10. OTHER SYMPTOMS: "Do you have any other symptoms?" (e.g., fever, chest pain, vomiting, diarrhea, bleeding)       Flushed, blurred vision  Protocols used: Dizziness - Lightheadedness-A-AH

## 2022-04-21 ENCOUNTER — Other Ambulatory Visit (HOSPITAL_COMMUNITY): Payer: Self-pay

## 2022-04-21 NOTE — Telephone Encounter (Signed)
Had spoken with patient in regards to arranging transportation for her appointment on 04-23-22 w/Angela Sharon Seller, who is covering for Dr. Andrey Campanile. Reviewed the telephone notes on 04-18-22. Called the # listed in that telephone note for transportation and was told that they had to have 5 days notice and could not schedule until 04-29-22. Pt had [place] on a 3-way call stating that it was emergent for her to be seen. The transportation person said that the only way that they would schedule emergent transportation was if we faxed them ([phone]) a letter from Dr. Andrey Campanile stating that she could not wait until 04-29-22 to be seen. I had Dr. Andrey Campanile review the documentation and Dr. Andrey Campanile determined that the pt could wait until 04-29-22.  I called the patient to inform her and she said "Never mind" and hung up.

## 2022-04-22 ENCOUNTER — Telehealth: Payer: Self-pay | Admitting: Internal Medicine

## 2022-04-22 DIAGNOSIS — E519 Thiamine deficiency, unspecified: Secondary | ICD-10-CM | POA: Insufficient documentation

## 2022-04-22 NOTE — Telephone Encounter (Signed)
Spoke with Gunner at [facility] and provided verbal authorization to replace the defective Enbrel Mini cartridge. Advised patient.

## 2022-04-22 NOTE — Telephone Encounter (Signed)
Patient called the office stating her Enbrel device is broken again and the [employer] needs a new prescription in order to send another replacement.

## 2022-04-23 ENCOUNTER — Ambulatory Visit: Payer: Medicare Other | Admitting: Physician Assistant

## 2022-04-28 ENCOUNTER — Encounter (HOSPITAL_BASED_OUTPATIENT_CLINIC_OR_DEPARTMENT_OTHER): Payer: Self-pay | Admitting: Physical Therapy

## 2022-04-28 ENCOUNTER — Ambulatory Visit (HOSPITAL_BASED_OUTPATIENT_CLINIC_OR_DEPARTMENT_OTHER): Payer: Medicare Other | Admitting: Physical Therapy

## 2022-04-28 ENCOUNTER — Ambulatory Visit (HOSPITAL_BASED_OUTPATIENT_CLINIC_OR_DEPARTMENT_OTHER): Payer: Medicare Other | Attending: Physical Medicine and Rehabilitation | Admitting: Physical Therapy

## 2022-04-28 ENCOUNTER — Other Ambulatory Visit: Payer: Self-pay

## 2022-04-28 DIAGNOSIS — G8929 Other chronic pain: Secondary | ICD-10-CM | POA: Diagnosis present

## 2022-04-28 DIAGNOSIS — M25561 Pain in right knee: Secondary | ICD-10-CM | POA: Insufficient documentation

## 2022-04-28 DIAGNOSIS — R2689 Other abnormalities of gait and mobility: Secondary | ICD-10-CM | POA: Diagnosis not present

## 2022-04-28 DIAGNOSIS — M797 Fibromyalgia: Secondary | ICD-10-CM | POA: Diagnosis not present

## 2022-04-28 DIAGNOSIS — M5459 Other low back pain: Secondary | ICD-10-CM | POA: Diagnosis present

## 2022-04-28 NOTE — Therapy (Signed)
OUTPATIENT PHYSICAL THERAPY THORACOLUMBAR EVALUATION   Patient Name: Elizabeth Bautista MRN: [id number] DOB:[birth year], 46 y.o., female Today's Date: 04/29/2022  END OF SESSION:  PT End of Session - 04/28/22 0959     Visit Number 1    Number of Visits 16    Date for PT Re-Evaluation 06/23/22    PT Start Time 0800    PT Stop Time 0843    PT Time Calculation (min) 43 min    Activity Tolerance Patient tolerated treatment well    Behavior During Therapy [facility] for tasks assessed/performed             Past Medical History:  Diagnosis Date   Acute respiratory failure with hypoxia (HCC)    Anemia    Asthma    COVID-19 virus infection 05/16/2020   04/27/20 dx covid-19, not hospitalized   Diabetes (HCC)    on Metformin   Eczema    Edema    Heart problem    "something with the arteries on the left side of the heart"; upcoming appt with cardiology for evaluation   HTN (hypertension)    Rheumatoid arthritis (HCC)    on Plaquenil    Seizures (HCC)    last seizure 2019   Sleep apnea    Sleep apnea    Past Surgical History:  Procedure Laterality Date   APPENDECTOMY     CESAREAN SECTION     x3   CHOLECYSTECTOMY  02/17/2022   GASTRIC BYPASS     LAPAROSCOPIC GASTROTOMY W/ REPAIR OF ULCER  02/17/2022   Patient Active Problem List   Diagnosis Date Noted   Laryngopharyngeal reflux (LPR) 12/26/2021   History of gastric bypass 10/21/2021   Spinal stenosis of lumbar region with neurogenic claudication 08/27/2021   Fall 08/27/2021   Sinusitis 08/11/2021   Allergic rhinitis 08/07/2021   Hospital discharge follow-up 08/02/2021   (HFpEF) heart failure with preserved ejection fraction (HCC) 07/20/2021   Body mass index (BMI) 45.0-49.9, adult (HCC) 02/05/2021   Iron deficiency anemia due to chronic blood loss 10/17/2020   Tension-type headache, not intractable 10/17/2020   High risk medication use 10/11/2020   Acute hip pain, left 09/19/2020   Sciatica 09/11/2020    Dysmenorrhea 06/04/2020   Menorrhagia with irregular cycle 06/04/2020   Anxiety and depression 06/04/2020   Physical deconditioning 05/24/2020   Severe persistent asthma 03/08/2020   GERD (gastroesophageal reflux disease) 03/07/2020   Anxiety 03/07/2020   History of seizures 03/07/2020   Swelling of lower extremity 02/01/2020   Essential hypertension 02/01/2020   Vitamin D deficiency 02/01/2020   Type 2 diabetes mellitus with hyperglycemia, without long-term current use of insulin (HCC) 02/01/2020   Anemia 02/01/2020   Class 3 severe obesity with serious comorbidity and body mass index (BMI) of 45.0 to 49.9 in adult (HCC) 10/22/2019   OSA on CPAP 10/18/2019   Seropositive rheumatoid arthritis (HCC) 10/18/2019    PCP: Dr Georganna Skeans   REFERRING PROVIDER: Dr Sula Soda   REFERRING DIAG:  Diagnosis  M79.7 (ICD-10-CM) - Fibromyalgia syndrome    Rationale for Evaluation and Treatment: Rehabilitation  THERAPY DIAG:  Other abnormalities of gait and mobility  Chronic pain of right knee  Other low back pain  ONSET DATE: Has had pain for over the past 3 years but pain is increased recently and general mobility has declined significantly over the past 3 months  SUBJECTIVE:  SUBJECTIVE STATEMENT: Patient has a left long history of multijoint pain.  She has significant pain in her right knee.  She also has significant pain in her lower back.  She has increased pain when she stands and walks.  She also has increased pain when she bends.  She has been using a cane for over a year.  She has attempted land PT in the past has not had good results.  She has been diagnosed with fibromyalgia and rheumatoid arthritis.  She also has pain in her hands.   PERTINENT HISTORY:  RA, Acute Hypoxia, Anemia, Heart  Valve issue; Seizures ( not since 2019) has signs before. Gastrotomy W/Ulcer repair;    PAIN:  Are you having pain? Yes: NPRS scale: 9 out of 10 Pain location: Right knee Pain description: Aching Aggravating factors: Standing and walking Relieving factors: Rest  Yes: NPRS scale: 9 out of 10 Pain location: Lower back Pain description: Aching Aggravating factors: Standing,walking bending rolling, nighttime Relieving factors: Rest  PRECAUTIONS: None  WEIGHT BEARING RESTRICTIONS: No  FALLS:  Has patient fallen in last 6 months? Yes. Number of falls over 10 falls. Legs give out. Last Tuesday was her last fall.   LIVING ENVIRONMENT: 14 steps in her house OCCUPATION:  Not able to work   Hobbies:  Trying to get back into the gym on a regular basis     PLOF: Independent with household mobility with device  PATIENT GOALS: to have less pain and to get back to working and going to the gym   NEXT MD VISIT:   OBJECTIVE:   DIAGNOSTIC FINDINGS:  X-ray:  IMPRESSION: Mild medial compartment osteoarthritis.   Moderate joint effusion.   Mild patella alta.       PATIENT SURVEYS:  FOTO    SCREENING FOR RED FLAGS: Bowel or bladder incontinence: No Spinal tumors: No Cauda equina syndrome: No Compression fracture: No Abdominal aneurysm: No  COGNITION: Overall cognitive status: Within functional limits for tasks assessed     SENSATION: Numbness and tingling down both legs  MUSCLE LENGTH: POSTURE: No Significant postural limitations  PALPATION:   LUMBAR ROM:   AROM eval  Flexion 75% limited with pain difficulty accurately assessing secondary to pain and balance  Extension   Right lateral flexion   Left lateral flexion   Right rotation 75% limited with pain  Left rotation 75% limited with pain   (Blank rows = not tested)  LOWER EXTREMITY ROM:     Passive  Right eval Left eval  Hip flexion    Hip extension    Hip abduction    Hip adduction    Hip  internal rotation    Hip external rotation    Knee flexion    Knee extension    Ankle dorsiflexion    Ankle plantarflexion    Ankle inversion    Ankle eversion     (Blank rows = not tested) Unable to test passive range of motion of lower extremities today; patient was extremely irritable.  Patient not put in supine position secondary to increased pain. LOWER EXTREMITY MMT:    MMT Right eval Left eval  Hip flexion 0 9.3  Hip extension    Hip abduction 7.2 7.0  Hip adduction    Hip internal rotation    Hip external rotation    Knee flexion    Knee extension Unable to extend right knee against gravity Unable to extend left knee against gravity  Ankle dorsiflexion    Ankle plantarflexion  Ankle inversion    Ankle eversion     (Blank rows = not tested) inconsistent muscle firing   FUNCTIONAL TESTS:  Uses  hands for sit to stand transfer   GAIT:  Little to no hip flexion and knee flexion on the right side. Patient is using her cane on the right side. She should be using it on her left. She reports when she usees it on her left she falls more.   TODAY'S TREATMENT:                                                                                                                              DATE:Reviewed basic movements of the left leg. Reviewed hurt vs harm concept and central nervous system    PATIENT EDUCATION:  Education details: HEP; graded exposure to activity;  Person educated: Patient Education method: Explanation, Demonstration, Tactile cues, Verbal cues, and Handouts Education comprehension: verbalized understanding, returned demonstration, verbal cues required, tactile cues required, and needs further education  HOME EXERCISE PROGRAM: Reviewed basic movements of the left leg. Reviewed hurt vs harm concept and central nervous system  ASSESSMENT:  CLINICAL IMPRESSION: Patient is a 46 year old female who presents with fibromyalgia, right knee pain, and lower back  pain with bilateral sciatica.  She has felt increased pain for the past 3 years but recently has been significantly affecting her mobility.  She has significant limitations in active and passive motion of both legs.  She demonstrates fear avoidance behaviors with active movement of both legs.  She has significant weakness in the muscle groups that she uses for walking.  She is using her cane on the wrong side but reports when she uses on the other side she has falls.  She reports 10 falls over the past 6 months.  She has decreased balance and increased difficulty transferring.  She would benefit from skilled therapy to improve her ability to ambulate, increase strength with functional tasks, and return to the gym and work. OBJECTIVE IMPAIRMENTS: Abnormal gait, decreased activity tolerance, decreased endurance, decreased mobility, difficulty walking, decreased ROM, decreased strength, postural dysfunction, and pain.   ACTIVITY LIMITATIONS: carrying, lifting, bending, sitting, standing, squatting, stairs, transfers, bed mobility, reach over head, and locomotion level  PARTICIPATION LIMITATIONS: meal prep, cleaning, laundry, driving, shopping, community activity, occupation, and yard work  PERSONAL FACTORS: 3+ comorbidities: Right knee OA, anemia, remote history of seizures, frequent falls,  are also affecting patient's functional outcome.   REHAB POTENTIAL: Good  CLINICAL DECISION MAKING: Evolving/moderate complexity declining mobility over the course of the last 3 years   EVALUATION COMPLEXITY: Moderate   GOALS: Goals reviewed with patient? Yes  SHORT TERM GOALS: Target date: 05/26/2022    Patient will demonstrate full active range of motion of lower extremities Baseline: Goal status: INITIAL  2.  Patient will increase gross lower extremity strength by 10 pounds bilateral Baseline:  Goal status: INITIAL  3.  Patient will increase lumbar flexion by 25%.  Baseline:  Goal status:  INITIAL  4.  Patient will be independent with initial HEP and show ability to perform basic movements Baseline:  Goal status: INITIAL  LONG TERM GOALS: Target date: 06/23/2022    Patient will stand for 25 minutes without increased pain in order to perform ADLs Baseline:  Goal status: INITIAL  2.  Patient will ambulate 2000 feet without significant increase in pain in order to perform IADLs Baseline:  Goal status: INITIAL  3.  Patient will bend to pick item off the floor without increased pain Baseline:   PLAN:  PT FREQUENCY: 2x/week  PT DURATION: 8 weeks  PLANNED INTERVENTIONS: Therapeutic exercises, Therapeutic activity, Neuromuscular re-education, Balance training, Gait training, Patient/Family education, Self Care, Joint mobilization, Stair training, Aquatic Therapy, Dry Needling, Cryotherapy, Moist heat, Taping, and Manual therapy.  PLAN FOR NEXT SESSION: Patient was given basic activities to try on land.  We reviewed hurt versus harm concepts and the sensitive sensitization of central nervous system.  In the pool work on the basic ambulation technique, weightbearing activities, hip strengthening, core strengthening, basic back stretching.  Patient was shows significant fear avoidance behaviors for basic movements.  Used the pool to decrease fear avoidance behaviors.   Dessie Coma, PT 04/29/2022, 1:11 PM

## 2022-04-29 ENCOUNTER — Other Ambulatory Visit (HOSPITAL_COMMUNITY): Payer: Self-pay

## 2022-05-08 ENCOUNTER — Ambulatory Visit (HOSPITAL_BASED_OUTPATIENT_CLINIC_OR_DEPARTMENT_OTHER): Payer: Medicare Other | Admitting: Physical Therapy

## 2022-05-09 ENCOUNTER — Ambulatory Visit (HOSPITAL_BASED_OUTPATIENT_CLINIC_OR_DEPARTMENT_OTHER): Payer: Self-pay | Admitting: Physical Therapy

## 2022-05-11 ENCOUNTER — Other Ambulatory Visit: Payer: Self-pay | Admitting: Allergy & Immunology

## 2022-05-11 DIAGNOSIS — L309 Dermatitis, unspecified: Secondary | ICD-10-CM

## 2022-05-14 ENCOUNTER — Encounter (HOSPITAL_BASED_OUTPATIENT_CLINIC_OR_DEPARTMENT_OTHER): Payer: Self-pay | Admitting: Physical Therapy

## 2022-05-14 ENCOUNTER — Ambulatory Visit (HOSPITAL_BASED_OUTPATIENT_CLINIC_OR_DEPARTMENT_OTHER): Payer: Medicare Other | Attending: Physical Medicine and Rehabilitation | Admitting: Physical Therapy

## 2022-05-14 ENCOUNTER — Other Ambulatory Visit: Payer: Self-pay

## 2022-05-14 DIAGNOSIS — R262 Difficulty in walking, not elsewhere classified: Secondary | ICD-10-CM | POA: Insufficient documentation

## 2022-05-14 DIAGNOSIS — R2689 Other abnormalities of gait and mobility: Secondary | ICD-10-CM | POA: Diagnosis present

## 2022-05-14 DIAGNOSIS — M5459 Other low back pain: Secondary | ICD-10-CM | POA: Diagnosis present

## 2022-05-14 DIAGNOSIS — M6281 Muscle weakness (generalized): Secondary | ICD-10-CM | POA: Diagnosis present

## 2022-05-14 DIAGNOSIS — M25561 Pain in right knee: Secondary | ICD-10-CM | POA: Insufficient documentation

## 2022-05-14 DIAGNOSIS — G8929 Other chronic pain: Secondary | ICD-10-CM | POA: Insufficient documentation

## 2022-05-14 NOTE — Therapy (Signed)
OUTPATIENT PHYSICAL THERAPY THORACOLUMBAR EVALUATION   Patient Name: Elizabeth Bautista MRN: [id number] DOB:1975/11/12, 46 y.o., female Today's Date: 05/14/2022  END OF SESSION:  PT End of Session - 05/14/22 1615     Visit Number 2    Number of Visits 16    Date for PT Re-Evaluation 06/23/22    PT Start Time 1615    PT Stop Time 1700    PT Time Calculation (min) 45 min    Activity Tolerance Patient tolerated treatment well    Behavior During Therapy [facility] for tasks assessed/performed             Past Medical History:  Diagnosis Date   Acute respiratory failure with hypoxia (HCC)    Anemia    Asthma    COVID-19 virus infection 05/16/2020   04/27/20 dx covid-19, not hospitalized   Diabetes (HCC)    on Metformin   Eczema    Edema    Heart problem    "something with the arteries on the left side of the heart"; upcoming appt with cardiology for evaluation   HTN (hypertension)    Rheumatoid arthritis (HCC)    on Plaquenil    Seizures (HCC)    last seizure 2019   Sleep apnea    Sleep apnea    Past Surgical History:  Procedure Laterality Date   APPENDECTOMY     CESAREAN SECTION     x3   CHOLECYSTECTOMY  02/17/2022   GASTRIC BYPASS     LAPAROSCOPIC GASTROTOMY W/ REPAIR OF ULCER  02/17/2022   Patient Active Problem List   Diagnosis Date Noted   Laryngopharyngeal reflux (LPR) 12/26/2021   History of gastric bypass 10/21/2021   Spinal stenosis of lumbar region with neurogenic claudication 08/27/2021   Fall 08/27/2021   Sinusitis 08/11/2021   Allergic rhinitis 08/07/2021   Hospital discharge follow-up 08/02/2021   (HFpEF) heart failure with preserved ejection fraction (HCC) 07/20/2021   Body mass index (BMI) 45.0-49.9, adult (HCC) 02/05/2021   Iron deficiency anemia due to chronic blood loss 10/17/2020   Tension-type headache, not intractable 10/17/2020   High risk medication use 10/11/2020   Acute hip pain, left 09/19/2020   Sciatica 09/11/2020    Dysmenorrhea 06/04/2020   Menorrhagia with irregular cycle 06/04/2020   Anxiety and depression 06/04/2020   Physical deconditioning 05/24/2020   Severe persistent asthma 03/08/2020   GERD (gastroesophageal reflux disease) 03/07/2020   Anxiety 03/07/2020   History of seizures 03/07/2020   Swelling of lower extremity 02/01/2020   Essential hypertension 02/01/2020   Vitamin D deficiency 02/01/2020   Type 2 diabetes mellitus with hyperglycemia, without long-term current use of insulin (HCC) 02/01/2020   Anemia 02/01/2020   Class 3 severe obesity with serious comorbidity and body mass index (BMI) of 45.0 to 49.9 in adult (HCC) 10/22/2019   OSA on CPAP 10/18/2019   Seropositive rheumatoid arthritis (HCC) 10/18/2019    PCP: Dr Georganna Skeans   REFERRING PROVIDER: Dr Sula Soda   REFERRING DIAG:  Diagnosis  M79.7 (ICD-10-CM) - Fibromyalgia syndrome    Rationale for Evaluation and Treatment: Rehabilitation  THERAPY DIAG:  Other abnormalities of gait and mobility  Chronic pain of right knee  Other low back pain  Difficulty in walking, not elsewhere classified  Muscle weakness (generalized)  ONSET DATE: Has had pain for over the past 3 years but pain is increased recently and general mobility has declined significantly over the past 3 months  SUBJECTIVE:  SUBJECTIVE STATEMENT:  "I am comfortable in water.  Pain 8/10"   Patient has a left long history of multijoint pain.  She has significant pain in her right knee.  She also has significant pain in her lower back.  She has increased pain when she stands and walks.  She also has increased pain when she bends.  She has been using a cane for over a year.  She has attempted land PT in the past has not had good results.  She has been diagnosed with  fibromyalgia and rheumatoid arthritis.  She also has pain in her hands.   PERTINENT HISTORY:  RA, Acute Hypoxia, Anemia, Heart Valve issue; Seizures ( not since 2019) has signs before. Gastrotomy W/Ulcer repair;    PAIN:  Are you having pain? Yes: NPRS scale: 8 out of 10 Pain location: Right knee Pain description: Aching Aggravating factors: Standing and walking Relieving factors: Rest  Yes: NPRS scale: 8 out of 10 Pain location: Lower back Pain description: Aching Aggravating factors: Standing,walking bending rolling, nighttime Relieving factors: Rest  PRECAUTIONS: None  WEIGHT BEARING RESTRICTIONS: No  FALLS:  Has patient fallen in last 6 months? Yes. Number of falls over 10 falls. Legs give out. Last Tuesday was her last fall.   LIVING ENVIRONMENT: 14 steps in her house OCCUPATION:  Not able to work   Hobbies:  Trying to get back into the gym on a regular basis     PLOF: Independent with household mobility with device  PATIENT GOALS: to have less pain and to get back to working and going to the gym   NEXT MD VISIT:   OBJECTIVE:   DIAGNOSTIC FINDINGS:  X-ray:  IMPRESSION: Mild medial compartment osteoarthritis.   Moderate joint effusion.   Mild patella alta.       PATIENT SURVEYS:  FOTO    SCREENING FOR RED FLAGS: Bowel or bladder incontinence: No Spinal tumors: No Cauda equina syndrome: No Compression fracture: No Abdominal aneurysm: No  COGNITION: Overall cognitive status: Within functional limits for tasks assessed     SENSATION: Numbness and tingling down both legs  MUSCLE LENGTH: POSTURE: No Significant postural limitations  PALPATION:   LUMBAR ROM:   AROM eval  Flexion 75% limited with pain difficulty accurately assessing secondary to pain and balance  Extension   Right lateral flexion   Left lateral flexion   Right rotation 75% limited with pain  Left rotation 75% limited with pain   (Blank rows = not tested)  LOWER  EXTREMITY ROM:     Passive  Right eval Left eval  Hip flexion    Hip extension    Hip abduction    Hip adduction    Hip internal rotation    Hip external rotation    Knee flexion    Knee extension    Ankle dorsiflexion    Ankle plantarflexion    Ankle inversion    Ankle eversion     (Blank rows = not tested) Unable to test passive range of motion of lower extremities today; patient was extremely irritable.  Patient not put in supine position secondary to increased pain. LOWER EXTREMITY MMT:    MMT Right eval Left eval  Hip flexion 0 9.3  Hip extension    Hip abduction 7.2 7.0  Hip adduction    Hip internal rotation    Hip external rotation    Knee flexion    Knee extension Unable to extend right knee against gravity Unable to extend left  knee against gravity  Ankle dorsiflexion    Ankle plantarflexion    Ankle inversion    Ankle eversion     (Blank rows = not tested) inconsistent muscle firing   FUNCTIONAL TESTS:  Uses  hands for sit to stand transfer   GAIT:  Little to no hip flexion and knee flexion on the right side. Patient is using her cane on the right side. She should be using it on her left. She reports when she usees it on her left she falls more.   TODAY'S TREATMENT:                                                                                                                              DATE:05/14/22  Pt seen for aquatic therapy today.  Treatment took place in water 3.25-4.5 ft in depth at the [facility] pool. Temp of water was 91.  Pt entered/exited the pool via stairs using step to pattern with bilateral hand rail. Pt instruction on stair climbing entering and exiting pool  *Intro to setting *Walking forward and back ue supported on yellow noodle and cga of therapist 2-3 widths ea. Min asst *Leaning against wall ue support on noodle 4.0 ft: df; pf and mini squat (as tolerated); hip circles.  *facing wall ue support on wall: df; pf; hip  adb/add; hip extension;  *side stepping ue supported on wall *Seated on lift chair: LAQ; hip add/abd; SLR *forward and back x 2 widths, side stepping x 2 widths.  cga   Pt requires the buoyancy and hydrostatic pressure of water for support, and to offload joints by unweighting joint load by at least 50 % in navel deep water and by at least 75-80% in chest to neck deep water.  Viscosity of the water is needed for resistance of strengthening. Water current perturbations provides challenge to standing balance requiring increased core activation.     PATIENT EDUCATION:  Education details: HEP; graded exposure to activity;  Person educated: Patient Education method: Explanation, Demonstration, Tactile cues, Verbal cues, and Handouts Education comprehension: verbalized understanding, returned demonstration, verbal cues required, tactile cues required, and needs further education  HOME EXERCISE PROGRAM: Reviewed basic movements of the left leg. Reviewed hurt vs harm concept and central nervous system  ASSESSMENT:  CLINICAL IMPRESSION: Pt needs assistance of therapist in pool during session to ensure safety and decrease pt's apprehension. She has very guarded movement throughout session requiring min asst, as session progresses therapist able to give CGA. Initial gait pattern: circumducting at hips bilaterally with no knee flex, then progressing to some knee flex to allow for heel strike and toe off .  Cues for relaxed posture and encouragement for improved gait pattern throughout session.  She is limited due to pain. Was able to encourage right and left knee flex seated. Pt enjoyed session and tolerated fairly well.  Reduction in pain by 2 points to 6/10 Pt reports having a seizure over the  weekend, first in quite a few years.  She states she did not go to the hospital nor inform her physician. She is instructed on the importance of letting her MD know.  She vu  Patient is a 46 year old female who  presents with fibromyalgia, right knee pain, and lower back pain with bilateral sciatica.  She has felt increased pain for the past 3 years but recently has been significantly affecting her mobility.  She has significant limitations in active and passive motion of both legs.  She demonstrates fear avoidance behaviors with active movement of both legs.  She has significant weakness in the muscle groups that she uses for walking.  She is using her cane on the wrong side but reports when she uses on the other side she has falls.  She reports 10 falls over the past 6 months.  She has decreased balance and increased difficulty transferring.  She would benefit from skilled therapy to improve her ability to ambulate, increase strength with functional tasks, and return to the gym and work. OBJECTIVE IMPAIRMENTS: Abnormal gait, decreased activity tolerance, decreased endurance, decreased mobility, difficulty walking, decreased ROM, decreased strength, postural dysfunction, and pain.   ACTIVITY LIMITATIONS: carrying, lifting, bending, sitting, standing, squatting, stairs, transfers, bed mobility, reach over head, and locomotion level  PARTICIPATION LIMITATIONS: meal prep, cleaning, laundry, driving, shopping, community activity, occupation, and yard work  PERSONAL FACTORS: 3+ comorbidities: Right knee OA, anemia, remote history of seizures, frequent falls,  are also affecting patient's functional outcome.   REHAB POTENTIAL: Good  CLINICAL DECISION MAKING: Evolving/moderate complexity declining mobility over the course of the last 3 years   EVALUATION COMPLEXITY: Moderate   GOALS: Goals reviewed with patient? Yes  SHORT TERM GOALS: Target date: 05/26/2022    Patient will demonstrate full active range of motion of lower extremities Baseline: Goal status: INITIAL  2.  Patient will increase gross lower extremity strength by 10 pounds bilateral Baseline:  Goal status: INITIAL  3.  Patient will  increase lumbar flexion by 25%. Baseline:  Goal status: INITIAL  4.  Patient will be independent with initial HEP and show ability to perform basic movements Baseline:  Goal status: INITIAL  LONG TERM GOALS: Target date: 06/23/2022    Patient will stand for 25 minutes without increased pain in order to perform ADLs Baseline:  Goal status: INITIAL  2.  Patient will ambulate 2000 feet without significant increase in pain in order to perform IADLs Baseline:  Goal status: INITIAL  3.  Patient will bend to pick item off the floor without increased pain Baseline:   PLAN:  PT FREQUENCY: 2x/week  PT DURATION: 8 weeks  PLANNED INTERVENTIONS: Therapeutic exercises, Therapeutic activity, Neuromuscular re-education, Balance training, Gait training, Patient/Family education, Self Care, Joint mobilization, Stair training, Aquatic Therapy, Dry Needling, Cryotherapy, Moist heat, Taping, and Manual therapy.  PLAN FOR NEXT SESSION: Patient was given basic activities to try on land.  We reviewed hurt versus harm concepts and the sensitive sensitization of central nervous system.  In the pool work on the basic ambulation technique, weightbearing activities, hip strengthening, core strengthening, basic back stretching.  Patient was shows significant fear avoidance behaviors for basic movements.  Used the pool to decrease fear avoidance behaviors.   Geni Bers, PT MPT 05/14/2022, 5:39 PM

## 2022-05-16 ENCOUNTER — Other Ambulatory Visit: Payer: Self-pay

## 2022-05-16 ENCOUNTER — Ambulatory Visit (HOSPITAL_BASED_OUTPATIENT_CLINIC_OR_DEPARTMENT_OTHER): Payer: Medicare Other | Admitting: Physical Therapy

## 2022-05-16 ENCOUNTER — Other Ambulatory Visit: Payer: Self-pay | Admitting: Internal Medicine

## 2022-05-16 ENCOUNTER — Encounter (HOSPITAL_BASED_OUTPATIENT_CLINIC_OR_DEPARTMENT_OTHER): Payer: Self-pay | Admitting: Physical Therapy

## 2022-05-16 DIAGNOSIS — R2689 Other abnormalities of gait and mobility: Secondary | ICD-10-CM | POA: Diagnosis not present

## 2022-05-16 DIAGNOSIS — G8929 Other chronic pain: Secondary | ICD-10-CM

## 2022-05-16 DIAGNOSIS — L309 Dermatitis, unspecified: Secondary | ICD-10-CM

## 2022-05-16 DIAGNOSIS — M5459 Other low back pain: Secondary | ICD-10-CM

## 2022-05-16 DIAGNOSIS — R262 Difficulty in walking, not elsewhere classified: Secondary | ICD-10-CM

## 2022-05-16 NOTE — Therapy (Addendum)
OUTPATIENT PHYSICAL THERAPY THORACOLUMBAR TREATMENT/discharge    Patient Name: Elizabeth Bautista MRN: [id number] DOB:1976/02/02, 46 y.o., female Today's Date: 05/16/2022  END OF SESSION:  PT End of Session - 05/16/22 1413     Visit Number 3    Number of Visits 16    Date for PT Re-Evaluation 06/23/22    PT Start Time [id number]    PT Stop Time 1500    PT Time Calculation (min) 43 min    Activity Tolerance Patient tolerated treatment well    Behavior During Therapy WFL for tasks assessed/performed             Past Medical History:  Diagnosis Date   Acute respiratory failure with hypoxia ([place])    Anemia    Asthma    COVID-19 virus infection 05/16/2020   04/27/20 dx covid-19, not hospitalized   Diabetes ([place])    on Metformin   Eczema    Edema    Heart problem    "something with the arteries on the left side of the heart"; upcoming appt with cardiology for evaluation   HTN (hypertension)    Rheumatoid arthritis ([place])    on Plaquenil    Seizures ([place])    last seizure 2019   Sleep apnea    Sleep apnea    Past Surgical History:  Procedure Laterality Date   APPENDECTOMY     CESAREAN SECTION     x3   CHOLECYSTECTOMY  02/17/2022   GASTRIC BYPASS     LAPAROSCOPIC GASTROTOMY W/ REPAIR OF ULCER  02/17/2022   Patient Active Problem List   Diagnosis Date Noted   Laryngopharyngeal reflux (LPR) 12/26/2021   History of gastric bypass 10/21/2021   Spinal stenosis of lumbar region with neurogenic claudication 08/27/2021   Fall 08/27/2021   Sinusitis 08/11/2021   Allergic rhinitis 08/07/2021   Hospital discharge follow-up 08/02/2021   (HFpEF) heart failure with preserved ejection fraction ([place]) 07/20/2021   Body mass index (BMI) 45.0-49.9, adult ([place]) 02/05/2021   Iron deficiency anemia due to chronic blood loss 10/17/2020   Tension-type headache, not intractable 10/17/2020   High risk medication use 10/11/2020   Acute hip pain, left 09/19/2020   Sciatica 09/11/2020    Dysmenorrhea 06/04/2020   Menorrhagia with irregular cycle 06/04/2020   Anxiety and depression 06/04/2020   Physical deconditioning 05/24/2020   Severe persistent asthma 03/08/2020   GERD (gastroesophageal reflux disease) 03/07/2020   Anxiety 03/07/2020   History of seizures 03/07/2020   Swelling of lower extremity 02/01/2020   Essential hypertension 02/01/2020   Vitamin D deficiency 02/01/2020   Type 2 diabetes mellitus with hyperglycemia, without long-term current use of insulin ([place]) 02/01/2020   Anemia 02/01/2020   Class 3 severe obesity with serious comorbidity and body mass index (BMI) of 45.0 to 49.9 in adult ([place]) 10/22/2019   OSA on CPAP 10/18/2019   Seropositive rheumatoid arthritis ([place]) 10/18/2019    PCP: Dr Dorna Mai   REFERRING PROVIDER: Dr Leeroy Cha   REFERRING DIAG:  Diagnosis  M79.7 (ICD-10-CM) - Fibromyalgia syndrome    Rationale for Evaluation and Treatment: Rehabilitation  THERAPY DIAG:  Other abnormalities of gait and mobility  Chronic pain of right knee  Other low back pain  Difficulty in walking, not elsewhere classified  ONSET DATE: Has had pain for over the past 3 years but pain is increased recently and general mobility has declined significantly over the past 3 months  SUBJECTIVE:  SUBJECTIVE STATEMENT:  "I was in a lot of pain after the last session."    From Eval:  Patient has a left long history of multijoint pain.  She has significant pain in her right knee.  She also has significant pain in her lower back.  She has increased pain when she stands and walks.  She also has increased pain when she bends.  She has been using a cane for over a year.  She has attempted land PT in the past has not had good results.  She has been diagnosed with fibromyalgia  and rheumatoid arthritis.  She also has pain in her hands.   PERTINENT HISTORY:  RA, Acute Hypoxia, Anemia, Heart Valve issue; Seizures ( not since 2019) has signs before. Gastrotomy W/Ulcer repair;    PAIN:  Are you having pain? Yes: NPRS scale: 9/ 10 Pain location: lower back into RLE to toes Pain description: Aching Aggravating factors: Standing and walking Relieving factors: Rest   PRECAUTIONS: None  WEIGHT BEARING RESTRICTIONS: No  FALLS:  Has patient fallen in last 6 months? Yes. Number of falls over 10 falls. Legs give out. Last Tuesday was her last fall.   LIVING ENVIRONMENT: 14 steps in her house OCCUPATION:  Not able to work   Hobbies:  Trying to get back into the gym on a regular basis     PLOF: Independent with household mobility with device  PATIENT GOALS: to have less pain and to get back to working and going to the gym   NEXT MD VISIT:   OBJECTIVE:   DIAGNOSTIC FINDINGS:  X-ray:  IMPRESSION: Mild medial compartment osteoarthritis.   Moderate joint effusion.   Mild patella alta.  PATIENT SURVEYS:  FOTO    SCREENING FOR RED FLAGS: Bowel or bladder incontinence: No Spinal tumors: No Cauda equina syndrome: No Compression fracture: No Abdominal aneurysm: No  COGNITION: Overall cognitive status: Within functional limits for tasks assessed     SENSATION: Numbness and tingling down both legs  MUSCLE LENGTH: POSTURE: No Significant postural limitations  PALPATION:   LUMBAR ROM:   AROM eval  Flexion 75% limited with pain difficulty accurately assessing secondary to pain and balance  Extension   Right lateral flexion   Left lateral flexion   Right rotation 75% limited with pain  Left rotation 75% limited with pain   (Blank rows = not tested)  LOWER EXTREMITY ROM:     Passive  Right eval Left eval  Hip flexion    Hip extension    Hip abduction    Hip adduction    Hip internal rotation    Hip external rotation    Knee  flexion    Knee extension    Ankle dorsiflexion    Ankle plantarflexion    Ankle inversion    Ankle eversion     (Blank rows = not tested) Unable to test passive range of motion of lower extremities today; patient was extremely irritable.  Patient not put in supine position secondary to increased pain. LOWER EXTREMITY MMT:    MMT Right eval Left eval  Hip flexion 0 9.3  Hip extension    Hip abduction 7.2 7.0  Hip adduction    Hip internal rotation    Hip external rotation    Knee flexion    Knee extension Unable to extend right knee against gravity Unable to extend left knee against gravity  Ankle dorsiflexion    Ankle plantarflexion    Ankle inversion  Ankle eversion     (Blank rows = not tested) inconsistent muscle firing   FUNCTIONAL TESTS:  Uses  hands for sit to stand transfer   GAIT:  Little to no hip flexion and knee flexion on the right side. Patient is using her cane on the right side. She should be using it on her left. She reports when she usees it on her left she falls more.   TODAY'S TREATMENT:                                                                                                                              DATE:05/16/22  Pt seen for aquatic therapy today.  Treatment took place in water 3.25-4.5 ft in depth at the [facility]. Temp of water was 92.  Pt entered/exited the pool via stairs using step-to pattern with heavy UE on bilateral hand rail.  * side stepping R/L holding wall x 2 * holding wall:  heel /toe raises x 10, small hip circles x 5 each;  hip abdct x 5 each x 2; hip ext x 5 x 2; gentle marching * pt instructed in TrA set; and returned demo during session * holding white barbell in 4+ ft of water (submerged above chest):  forward/ backward walking with cues for toe/heel, heel/toe - multiple laps  * side stepping holding white barbell, with increased step height  *supported by yellow noodle between LEs, holding wall ->  yellow hand floats:  cycling    Pt requires the buoyancy and hydrostatic pressure of water for support, and to offload joints by unweighting joint load by at least 50 % in navel deep water and by at least 75-80% in chest to neck deep water.  Viscosity of the water is needed for resistance of strengthening. Water current perturbations provides challenge to standing balance requiring increased core activation.     PATIENT EDUCATION:  Education details: HEP; graded exposure to activity;  Person educated: Patient Education method: Explanation, Demonstration, Tactile cues, Verbal cues, and Handouts Education comprehension: verbalized understanding, returned demonstration, verbal cues required, tactile cues required, and needs further education  HOME EXERCISE PROGRAM: Reviewed basic movements of the left leg. Reviewed hurt vs harm concept and central nervous system    ASSESSMENT:  CLINICAL IMPRESSION: Pt able to take direction and cues from therapist on deck.  Pt observed keeping RLE stiff when progressing it during gait on land.  In water submerged past chest, with cues, gait pattern improved with increased R hip / knee flexion during swing through to toe off. Pt reported reduction of pain by 1-2 points during session. She was much less guarded than first aquatic session. Requires UE support when away from wall or when supported on noodle in deeper water. Encouraged self care after this session and to expect exercise soreness. Pt to see if her membership to local gym includes pool access.     She would benefit from skilled therapy to  improve her ability to ambulate, increase strength with functional tasks, and return to the gym and work.  OBJECTIVE IMPAIRMENTS: Abnormal gait, decreased activity tolerance, decreased endurance, decreased mobility, difficulty walking, decreased ROM, decreased strength, postural dysfunction, and pain.   ACTIVITY LIMITATIONS: carrying, lifting, bending, sitting,  standing, squatting, stairs, transfers, bed mobility, reach over head, and locomotion level  PARTICIPATION LIMITATIONS: meal prep, cleaning, laundry, driving, shopping, community activity, occupation, and yard work  PERSONAL FACTORS: 3+ comorbidities: Right knee OA, anemia, remote history of seizures, frequent falls,  are also affecting patient's functional outcome.   REHAB POTENTIAL: Good  CLINICAL DECISION MAKING: Evolving/moderate complexity declining mobility over the course of the last 3 years   EVALUATION COMPLEXITY: Moderate   GOALS: Goals reviewed with patient? Yes  SHORT TERM GOALS: Target date: 05/26/2022    Patient will demonstrate full active range of motion of lower extremities Baseline: Goal status: INITIAL  2.  Patient will increase gross lower extremity strength by 10 pounds bilateral Baseline:  Goal status: INITIAL  3.  Patient will increase lumbar flexion by 25%. Baseline:  Goal status: INITIAL  4.  Patient will be independent with initial HEP and show ability to perform basic movements Baseline:  Goal status: INITIAL  LONG TERM GOALS: Target date: 06/23/2022    Patient will stand for 25 minutes without increased pain in order to perform ADLs Baseline:  Goal status: INITIAL  2.  Patient will ambulate 2000 feet without significant increase in pain in order to perform IADLs Baseline:  Goal status: INITIAL  3.  Patient will bend to pick item off the floor without increased pain Baseline:   PLAN:  PT FREQUENCY: 2x/week  PT DURATION: 8 weeks  PLANNED INTERVENTIONS: Therapeutic exercises, Therapeutic activity, Neuromuscular re-education, Balance training, Gait training, Patient/Family education, Self Care, Joint mobilization, Stair training, Aquatic Therapy, Dry Needling, Cryotherapy, Moist heat, Taping, and Manual therapy.  PLAN FOR NEXT SESSION: Patient was given basic activities to try on land.  We reviewed hurt versus harm concepts and the  sensitive sensitization of central nervous system.  In the pool work on the basic ambulation technique, weightbearing activities, hip strengthening, core strengthening, basic back stretching.  Patient was shows significant fear avoidance behaviors for basic movements.  Used the pool to decrease fear avoidance behaviors.  Discharge performed by Carolyne Littles on 2/13  Visits from Start of Care: 1  Current functional level related to goals / functional outcomes: Did not return for visit    Remaining deficits: Unknown    Education / Equipment: HEP   Patient agrees to discharge. Patient goals were not not met. Patient is being discharged due to Not returning since last visit   Kerin Perna, PTA 05/16/22 4:07 PM [facility] [address] [place], [place], [address] Phone: [phone]   Fax:  [phone]

## 2022-05-20 ENCOUNTER — Ambulatory Visit (HOSPITAL_BASED_OUTPATIENT_CLINIC_OR_DEPARTMENT_OTHER): Payer: Medicare Other | Admitting: Physical Therapy

## 2022-05-22 ENCOUNTER — Ambulatory Visit (HOSPITAL_BASED_OUTPATIENT_CLINIC_OR_DEPARTMENT_OTHER): Payer: Medicare Other | Admitting: Physical Therapy

## 2022-05-27 ENCOUNTER — Encounter: Payer: Self-pay | Admitting: Physical Medicine and Rehabilitation

## 2022-05-27 ENCOUNTER — Encounter: Payer: Medicare Other | Attending: Physical Medicine & Rehabilitation | Admitting: Physical Medicine and Rehabilitation

## 2022-05-27 ENCOUNTER — Other Ambulatory Visit: Payer: Self-pay

## 2022-05-27 ENCOUNTER — Other Ambulatory Visit (HOSPITAL_COMMUNITY): Payer: Self-pay

## 2022-05-27 VITALS — BP 119/74 | HR 66 | Ht 64.0 in | Wt 171.0 lb

## 2022-05-27 DIAGNOSIS — M797 Fibromyalgia: Secondary | ICD-10-CM | POA: Diagnosis present

## 2022-05-27 DIAGNOSIS — Z6832 Body mass index (BMI) 32.0-32.9, adult: Secondary | ICD-10-CM | POA: Diagnosis present

## 2022-05-27 DIAGNOSIS — E669 Obesity, unspecified: Secondary | ICD-10-CM | POA: Insufficient documentation

## 2022-05-27 DIAGNOSIS — Z9189 Other specified personal risk factors, not elsewhere classified: Secondary | ICD-10-CM | POA: Diagnosis present

## 2022-05-27 DIAGNOSIS — E66811 Obesity, class 1: Secondary | ICD-10-CM

## 2022-05-27 MED ORDER — TOPIRAMATE 25 MG PO TABS: 50.0000 mg | ORAL_TABLET | Freq: Every evening | ORAL | 3 refills | Status: DC

## 2022-05-27 NOTE — Addendum Note (Signed)
Addended by: Horton Chin on: 05/27/2022 10:04 AM   Modules accepted: Orders
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
## 2022-05-28 ENCOUNTER — Telehealth: Payer: Self-pay | Admitting: Pharmacist

## 2022-05-28 ENCOUNTER — Ambulatory Visit (HOSPITAL_BASED_OUTPATIENT_CLINIC_OR_DEPARTMENT_OTHER): Payer: Medicare Other | Admitting: Physical Therapy

## 2022-05-28 NOTE — Telephone Encounter (Signed)
Received fax from [facility] stating patient's Enbrel is requiring prior authorization. She has appt on 06/11/2022 with Dr. Dimple Casey and can submit renewal then as last note does not indicate clinical improvement.  PA letter scanned to [employer] for now  Chesley Mires, PharmD, MPH, BCPS, CPP Clinical Pharmacist (Rheumatology and Pulmonology)

## 2022-05-30 ENCOUNTER — Ambulatory Visit (INDEPENDENT_AMBULATORY_CARE_PROVIDER_SITE_OTHER): Payer: Medicare Other | Admitting: Pulmonary Disease

## 2022-05-30 ENCOUNTER — Encounter (HOSPITAL_BASED_OUTPATIENT_CLINIC_OR_DEPARTMENT_OTHER): Payer: Self-pay | Admitting: Pulmonary Disease

## 2022-05-30 ENCOUNTER — Ambulatory Visit (HOSPITAL_BASED_OUTPATIENT_CLINIC_OR_DEPARTMENT_OTHER): Payer: Medicare Other | Admitting: Physical Therapy

## 2022-05-30 VITALS — BP 118/82 | HR 81 | Temp 98.2°F | Ht 64.0 in | Wt 170.4 lb

## 2022-05-30 DIAGNOSIS — G4733 Obstructive sleep apnea (adult) (pediatric): Secondary | ICD-10-CM

## 2022-05-30 DIAGNOSIS — J4540 Moderate persistent asthma, uncomplicated: Secondary | ICD-10-CM

## 2022-05-30 MED ORDER — TRIAMCINOLONE ACETONIDE 0.025 % EX OINT: 1.0000 | TOPICAL_OINTMENT | Freq: Two times a day (BID) | CUTANEOUS | 2 refills | Status: AC

## 2022-05-30 MED ORDER — ALBUTEROL SULFATE (2.5 MG/3ML) 0.083% IN NEBU: 2.5000 mg | INHALATION_SOLUTION | Freq: Four times a day (QID) | RESPIRATORY_TRACT | 5 refills | Status: AC | PRN

## 2022-05-30 MED ORDER — TRELEGY ELLIPTA 200-62.5-25 MCG/ACT IN AEPB: 1.0000 | INHALATION_SPRAY | Freq: Every day | RESPIRATORY_TRACT | 5 refills | Status: DC

## 2022-05-30 MED ORDER — ALBUTEROL SULFATE HFA 108 (90 BASE) MCG/ACT IN AERS: 2.0000 | INHALATION_SPRAY | Freq: Four times a day (QID) | RESPIRATORY_TRACT | 11 refills | Status: DC | PRN

## 2022-05-30 NOTE — Patient Instructions (Signed)
Start using your CPAP again.  Call if you feel the CPAP setting is too high.  Follow up in 6 months.

## 2022-05-30 NOTE — Progress Notes (Signed)
[facility]  Chief Complaint  Patient presents with   Follow-up    Pt states that her asthma is pretty much under control since LOV.      Constitutional:  BP 118/82 (BP Location: Left Arm, Patient Position: Sitting, Cuff Size: Normal)   Pulse 81   Temp 98.2 F (36.8 C) (Oral)   Ht _0  (1.626 m)   Wt 170 lb 6.4 oz (77.3 kg)   SpO2 99%   BMI 29.25 kg/m   Past Medical History:  Asthma, Anemia, Seizures, DM type 2, HTN, GERD, RA, COVID 19 infection November 2021, Iron deficiency anemia  Past Surgical History:  Her  has a past surgical history that includes Appendectomy; Cesarean section; Gastric bypass; Cholecystectomy (02/17/2022); and Laparoscopic gastrotomy w/ repair of ulcer (02/17/2022).  Brief Summary:  Elizabeth Bautista is a 46 y.o. female former smoker with asthma, upper airway cough, laryngopharyngeal reflux, and obstructive sleep apnea.       Subjective:   She had bariatric surgery with laparoscopic gastric sleeve in May 2023.  She had persistent abdominal pain and was found to have ulceration around the anastomotic site.  She had roux-en-y gastric bypass and cholecystectomy in September 2023.  Her weight is down about 110 lbs since March 2023.  She was seen by Dr. Constance Holster with ENT in July 2023 for possible vocal cord dysfunction.  Her examination was normal.    She was started on enbrel in August 2023 for rheumatoid arthritis.    She hasn't been using CPAP since she lost weight.  Her daughter says she still gets some issues with her sleep.  She isn't needing rescue medication as much.  Not having sinus congestion or wheeze.    Physical Exam:   Appearance - well kempt   ENMT - no sinus tenderness, no oral exudate, no LAN, Mallampati 3 airway, no stridor  Respiratory - equal breath sounds bilaterally, no wheezing or rales  CV - s1s2 regular rate and rhythm, no murmurs  Ext - no clubbing, no edema  Skin - no  rashes  Psych - normal mood and affect       Pulmonary testing:  CBC 05/10/20 >> eosinophils 0.4 K/uL Allergy test 05/30/20 >> rye/bermuda/johnson grass, mold, hickory, dog, cat, cockroach A1AT 07/09/20 >> 147 RAST 07/09/20 >> dust mites, cats, dogs, cockroach, pecan hickory; IgE 884 Spirometry 03/07/22 >> FEV1 1.92 (79%), FEV1% 82  Chest Imaging:  CT sinus 10/20/19 >> normal CT angio chest 05/10/20 >> airway thickening, faint patchy GGO in lunges  Sleep Tests:  PSG 05/23/20 >> AHI 6.7, SpO2 low 83%.  REM AHI 32.4. Auto CPAP 08/07/21 to 08/25/21 >> used on 19 of 20 nights with average 4 hrs 13 min.  Average AHI 0.4 with median CPAP 10 and 95 th percentile CPAP 12 cm H2O  Cardiac Tests:  Echo 07/21/21 >> EF 60 to 65%, mod LVH, grade 1 DD  Social History:  She  reports that she quit smoking about 8 years ago. Her smoking use included cigarettes. She has a 0.50 pack-year smoking history. She has never been exposed to tobacco smoke. She has never used smokeless tobacco. She reports that she does not currently use alcohol. She reports that she does not currently use drugs.  Family History:  Her family history includes Angioedema in her daughter; Asthma in her daughter, maternal aunt, and son; Breast cancer in her cousin and cousin; Cervical cancer in her maternal aunt; Dementia in her  maternal aunt; Diabetes in her brother, brother, father, paternal aunt, paternal grandmother, sister, and another family member; Heart Problems in her maternal grandmother and sister; Heart attack in an other family member; High blood pressure in her mother and another family member; Rheum arthritis in her mother; Seizures in her cousin, cousin, and cousin.     Assessment/Plan:   Severe, persistent allergic asthma. - breztri wasn't effective and yupelri is too expensive - continue trelegy 200 one puff daily - prn albuterol; she has a nebulizer device - she gets singulair and nucala from Dr. Gillermina Hu  office  Obstructive sleep apnea. - she is compliant with CPAP and reports benefit from therapy - she uses [place] for her DME - advised her to resume CPAP therapy for now; once her weight is stable, we will then repeat a home sleep study to determine if she still needs CPAP therapy - continue auto CPAP 5 to 15 cm H2O  Upper airway cough syndrome. - has perennial allergic rhinitis - followed by Dr. Ernst Bowler - continue singulair, flonase, astelin, zyrtec  Seropositive rheumatoid arthritis. - followed by Dr. [name] with [facility] - on enbrel and plaquenil  Obesity. 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 Heron Nay with [facility]  Time Spent Involved in Patient Care on Day of Examination:  36 minutes  Follow up:   Patient Instructions  Start using your CPAP again.  Call if you feel the CPAP setting is too high.  Follow up in 6 months.  Medication List:   Allergies as of 05/30/2022       Reactions   Ferumoxytol Anaphylaxis   ~5 years ago. Tolerated IV venofer   Phenytoin Anaphylaxis   Other reaction(s): swelling and heart rate decreased   Pecan Nut (diagnostic) Other (See Comments)   Phenylbutazones Other (See Comments)        Medication List        Accurate as of May 30, 2022  8:50 AM. If you have any questions, ask your nurse or doctor.          STOP taking these medications    ipratropium-albuterol 0.5-2.5 (3) MG/3ML Soln Commonly known as: DUONEB Stopped by: Chesley Mires, MD       TAKE these medications    albuterol (2.5 MG/3ML) 0.083% nebulizer solution Commonly known as: PROVENTIL Take 3 mLs (2.5 mg total) by nebulization every 6 (six) hours as needed for wheezing or shortness of breath. What changed:  See the new instructions. Another medication with the same name was removed. Continue taking this medication, and follow the  directions you see here. Changed by: Chesley Mires, MD   albuterol 108 (90 Base) MCG/ACT inhaler Commonly known as: ProAir HFA Inhale 2 puffs into the lungs every 6 (six) hours as needed for wheezing or shortness of breath. 2 puffs every 4 hours as needed only  if your can't catch your breath What changed:  how much to take how to take this when to take this reasons to take this Another medication with the same name was removed. Continue taking this medication, and follow the directions you see here. Changed by: Chesley Mires, MD   azelastine 0.1 % nasal spray Commonly known as: ASTELIN Place 1 spray into both nostrils 2 (two) times daily. Use in each nostril as directed   blood glucose meter kit and supplies Dispense based on patient and insurance preference. Use  up to four times daily as directed. (FOR ICD-10 E10.9, E11.9).   busPIRone 7.5 MG tablet Commonly known as: BUSPAR Take 7.5 mg by mouth daily.   cetirizine 10 MG tablet Commonly known as: ZYRTEC TAKE 1 TABLET BY MOUTH EVERY DAY   Cholecalciferol 1.25 MG (50000 UT) Tabs Take 1 tablet by mouth daily. What changed:  how much to take when to take this   cyclobenzaprine 10 MG tablet Commonly known as: FLEXERIL Take 10 mg by mouth daily.   DULoxetine 60 MG capsule Commonly known as: CYMBALTA Take 60 mg by mouth daily.   Enbrel Mini 50 MG/ML Soct Generic drug: Etanercept Inject 50 mg into the skin once a week.   EPINEPHrine 0.3 mg/0.3 mL Soaj injection Commonly known as: EPI-PEN Inject 0.3 mg into the muscle as needed for anaphylaxis.   famotidine 20 MG tablet Commonly known as: PEPCID Take 1 tablet (20 mg total) by mouth daily.   ferrous sulfate 325 (65 FE) MG tablet TAKE 1 TABLET BY MOUTH TWICE A DAY   Flowflex COVID-19 Ag Home Test Kit Generic drug: COVID-19 At Home Antigen Test See admin instructions.   fluticasone 50 MCG/ACT nasal spray Commonly known as: FLONASE 2 sprays in each nostril once a day  as needed for nasal congestion   furosemide 20 MG tablet Commonly known as: LASIX Take 20 mg by mouth daily.   HYDROcodone-acetaminophen 5-325 MG tablet Commonly known as: NORCO/VICODIN Take 1 tablet by mouth every 4 (four) hours as needed for moderate pain.   hydroxychloroquine 200 MG tablet Commonly known as: PLAQUENIL Take 1 tablet (200 mg total) by mouth 2 (two) times daily.   levETIRAcetam 500 MG tablet Commonly known as: Keppra Take 1 tablet (500 mg total) by mouth 2 (two) times daily.   meclizine 12.5 MG tablet Commonly known as: ANTIVERT Take 12.5 mg by mouth 3 (three) times daily as needed.   metoCLOPramide 10 MG tablet Commonly known as: REGLAN Take 10 mg by mouth as needed.   montelukast 10 MG tablet Commonly known as: SINGULAIR TAKE 1 TABLET BY MOUTH EVERYDAY AT BEDTIME   Nucala 100 MG/ML Soaj Generic drug: Mepolizumab Inject 100 mg into the skin every 28 (twenty-eight) days.   omeprazole 20 MG capsule Commonly known as: PRILOSEC Take by mouth.   ondansetron 8 MG disintegrating tablet Commonly known as: ZOFRAN-ODT Take 1 tablet (8 mg total) by mouth every 8 (eight) hours as needed for nausea or vomiting.   pantoprazole 40 MG tablet Commonly known as: PROTONIX Take 1 tablet (40 mg total) by mouth daily. Take 30-60 min before first meal of the day   pregabalin 200 MG capsule Commonly known as: LYRICA Take 1 capsule (200 mg total) by mouth 2 (two) times daily.   QUEtiapine 100 MG tablet Commonly known as: SEROQUEL Take 100 mg by mouth at bedtime.   QUEtiapine 100 MG tablet Commonly known as: SEROQUEL Take by mouth.   rizatriptan 10 MG disintegrating tablet Commonly known as: MAXALT-MLT Take by mouth.   Savella 12.5 MG Tabs Generic drug: Milnacipran HCl Take by mouth.   topiramate 25 MG tablet Commonly known as: Topamax Take 2 tablets (50 mg total) by mouth at bedtime.   Trelegy Ellipta 200-62.5-25 MCG/ACT Aepb Generic drug:  Fluticasone-Umeclidin-Vilant Inhale 1 puff into the lungs daily.   triamcinolone 0.025 % ointment Commonly known as: KENALOG Apply 1 Application topically 2 (two) times daily. Started by: Chesley Mires, MD   ursodiol 300 MG capsule Commonly known as: ACTIGALL  Take 300 mg by mouth 2 (two) times daily.   vitamin B-1 250 MG tablet Take by mouth.   Xifaxan 550 MG Tabs tablet Generic drug: rifaximin Take 550 mg by mouth 3 (three) times daily.   zonisamide 100 MG capsule Commonly known as: ZONEGRAN Take 1 capsule (100 mg total) by mouth daily.        Signature:  Chesley Mires, MD [facility] Pager - [phone] 05/30/2022, 8:50 AM

## 2022-06-02 HISTORY — PX: FOOT SURGERY: SHX648

## 2022-06-04 ENCOUNTER — Telehealth: Payer: Self-pay | Admitting: *Deleted

## 2022-06-04 DIAGNOSIS — G4733 Obstructive sleep apnea (adult) (pediatric): Secondary | ICD-10-CM

## 2022-06-04 NOTE — Telephone Encounter (Signed)
Order placed

## 2022-06-05 ENCOUNTER — Telehealth: Payer: Self-pay | Admitting: Pulmonary Disease

## 2022-06-05 NOTE — Telephone Encounter (Signed)
Order was placed. PCCs can we follow up on this, was it faxed over to [place] yet?

## 2022-06-09 ENCOUNTER — Encounter (HOSPITAL_COMMUNITY): Payer: Self-pay | Admitting: Emergency Medicine

## 2022-06-09 ENCOUNTER — Emergency Department (HOSPITAL_COMMUNITY): Payer: Medicare Other

## 2022-06-09 ENCOUNTER — Other Ambulatory Visit: Payer: Self-pay

## 2022-06-09 ENCOUNTER — Emergency Department (HOSPITAL_COMMUNITY)
Admission: EM | Admit: 2022-06-09 | Discharge: 2022-06-09 | Disposition: A | Discharge status: Home / Self Care | Payer: Medicare Other | Attending: Student | Admitting: Student

## 2022-06-09 DIAGNOSIS — E119 Type 2 diabetes mellitus without complications: Secondary | ICD-10-CM | POA: Insufficient documentation

## 2022-06-09 DIAGNOSIS — Z7984 Long term (current) use of oral hypoglycemic drugs: Secondary | ICD-10-CM | POA: Insufficient documentation

## 2022-06-09 DIAGNOSIS — E860 Dehydration: Secondary | ICD-10-CM

## 2022-06-09 DIAGNOSIS — R058 Other specified cough: Secondary | ICD-10-CM | POA: Insufficient documentation

## 2022-06-09 DIAGNOSIS — Z87891 Personal history of nicotine dependence: Secondary | ICD-10-CM | POA: Insufficient documentation

## 2022-06-09 DIAGNOSIS — I10 Essential (primary) hypertension: Secondary | ICD-10-CM | POA: Insufficient documentation

## 2022-06-09 DIAGNOSIS — Z8616 Personal history of COVID-19: Secondary | ICD-10-CM | POA: Diagnosis not present

## 2022-06-09 DIAGNOSIS — J45909 Unspecified asthma, uncomplicated: Secondary | ICD-10-CM | POA: Diagnosis not present

## 2022-06-09 DIAGNOSIS — R531 Weakness: Secondary | ICD-10-CM | POA: Diagnosis not present

## 2022-06-09 DIAGNOSIS — R197 Diarrhea, unspecified: Secondary | ICD-10-CM

## 2022-06-09 DIAGNOSIS — R0682 Tachypnea, not elsewhere classified: Secondary | ICD-10-CM | POA: Diagnosis not present

## 2022-06-09 DIAGNOSIS — H6093 Unspecified otitis externa, bilateral: Secondary | ICD-10-CM

## 2022-06-09 LAB — BASIC METABOLIC PANEL
Anion gap: 10 (ref 5–15)
BUN: 9 mg/dL (ref 6–20)
CO2: 25 mmol/L (ref 22–32)
Calcium: 8.5 mg/dL — ABNORMAL LOW (ref 8.9–10.3)
Chloride: 101 mmol/L (ref 98–111)
Creatinine, Ser: 0.79 mg/dL (ref 0.44–1.00)
GFR, Estimated: >60 mL/min (ref >60)
Glucose, Bld: 152 mg/dL — ABNORMAL HIGH (ref 70–99)
Potassium: 3.4 mmol/L — ABNORMAL LOW (ref 3.5–5.1)
Sodium: 136 mmol/L (ref 135–145)

## 2022-06-09 LAB — CBC WITH DIFFERENTIAL/PLATELET
Abs Immature Granulocytes: 0.02 10*3/uL (ref 0.00–0.07)
Basophils Absolute: 0 10*3/uL (ref 0.0–0.1)
Basophils Relative: 1 %
Eosinophils Absolute: 0.2 10*3/uL (ref 0.0–0.5)
Eosinophils Relative: 2 %
HCT: 38.4 % (ref 36.0–46.0)
Hemoglobin: 12.5 g/dL (ref 12.0–15.0)
Immature Granulocytes: 0 %
Lymphocytes Relative: 28 %
Lymphs Abs: 2.2 10*3/uL (ref 0.7–4.0)
MCH: 28.2 pg (ref 26.0–34.0)
MCHC: 32.6 g/dL (ref 30.0–36.0)
MCV: 86.5 fL (ref 80.0–100.0)
Monocytes Absolute: 0.7 10*3/uL (ref 0.1–1.0)
Monocytes Relative: 10 %
Neutro Abs: 4.6 10*3/uL (ref 1.7–7.7)
Neutrophils Relative %: 59 %
Platelets: 200 10*3/uL (ref 150–400)
RBC: 4.44 MIL/uL (ref 3.87–5.11)
RDW: 13.7 % (ref 11.5–15.5)
WBC: 7.8 10*3/uL (ref 4.0–10.5)
nRBC: 0 % (ref 0.0–0.2)

## 2022-06-09 LAB — COMPREHENSIVE METABOLIC PANEL
ALT: 16 U/L (ref 0–44)
AST: 20 U/L (ref 15–41)
Albumin: 3.2 g/dL — ABNORMAL LOW (ref 3.5–5.0)
Alkaline Phosphatase: 85 U/L (ref 38–126)
Anion gap: 10 (ref 5–15)
BUN: 8 mg/dL (ref 6–20)
CO2: 22 mmol/L (ref 22–32)
Calcium: 8.4 mg/dL — ABNORMAL LOW (ref 8.9–10.3)
Chloride: 103 mmol/L (ref 98–111)
Creatinine, Ser: 0.64 mg/dL (ref 0.44–1.00)
GFR, Estimated: >60 mL/min (ref >60)
Glucose, Bld: 87 mg/dL (ref 70–99)
Potassium: 3.4 mmol/L — ABNORMAL LOW (ref 3.5–5.1)
Sodium: 135 mmol/L (ref 135–145)
Total Bilirubin: 0.6 mg/dL (ref 0.3–1.2)
Total Protein: 6.4 g/dL — ABNORMAL LOW (ref 6.5–8.1)

## 2022-06-09 LAB — URINALYSIS, ROUTINE W REFLEX MICROSCOPIC
Bilirubin Urine: NEGATIVE
Glucose, UA: NEGATIVE mg/dL
Hgb urine dipstick: NEGATIVE
Ketones, ur: 20 mg/dL — AB
Leukocytes,Ua: NEGATIVE
Nitrite: NEGATIVE
Protein, ur: 30 mg/dL — AB
Specific Gravity, Urine: 1.03 (ref 1.005–1.030)
pH: 5 (ref 5.0–8.0)

## 2022-06-09 LAB — CBC
HCT: 40.5 % (ref 36.0–46.0)
Hemoglobin: 13.5 g/dL (ref 12.0–15.0)
MCH: 30 pg (ref 26.0–34.0)
MCHC: 33.3 g/dL (ref 30.0–36.0)
MCV: 90 fL (ref 80.0–100.0)
Platelets: 184 10*3/uL (ref 150–400)
RBC: 4.5 MIL/uL (ref 3.87–5.11)
RDW: 13.2 % (ref 11.5–15.5)
WBC: 7.4 10*3/uL (ref 4.0–10.5)
nRBC: 0 % (ref 0.0–0.2)

## 2022-06-09 LAB — RESP PANEL BY RT-PCR (RSV, FLU A&B, COVID)  RVPGX2
Influenza A by PCR: NEGATIVE
Influenza B by PCR: NEGATIVE
Resp Syncytial Virus by PCR: NEGATIVE
SARS Coronavirus 2 by RT PCR: NEGATIVE

## 2022-06-09 LAB — BRAIN NATRIURETIC PEPTIDE: B Natriuretic Peptide: 20 pg/mL (ref 0.0–100.0)

## 2022-06-09 LAB — TROPONIN I (HIGH SENSITIVITY)
Troponin I (High Sensitivity): <2 ng/L (ref <18)
Troponin I (High Sensitivity): <2 ng/L (ref <18)

## 2022-06-09 LAB — CBG MONITORING, ED: Glucose-Capillary: 86 mg/dL (ref 70–99)

## 2022-06-09 MED ORDER — LACTATED RINGERS IV BOLUS
1000.0000 mL | Freq: Once | INTRAVENOUS | Status: AC
  Administered 2022-06-09: 1000 mL via INTRAVENOUS

## 2022-06-09 MED ORDER — ONDANSETRON 4 MG PO TBDP
4.0000 mg | ORAL_TABLET | Freq: Once | ORAL | Status: AC
  Administered 2022-06-09: 4 mg via ORAL
  Filled 2022-06-09: qty 1

## 2022-06-09 MED ORDER — KETOROLAC TROMETHAMINE 15 MG/ML IJ SOLN
15.0000 mg | Freq: Once | INTRAMUSCULAR | Status: AC
  Administered 2022-06-09: 15 mg via INTRAVENOUS
  Filled 2022-06-09: qty 1

## 2022-06-09 MED ORDER — POTASSIUM CHLORIDE CRYS ER 20 MEQ PO TBCR: 20.0000 meq | EXTENDED_RELEASE_TABLET | Freq: Two times a day (BID) | ORAL | 0 refills | Status: AC

## 2022-06-09 MED ORDER — DICYCLOMINE HCL 10 MG PO CAPS
20.0000 mg | ORAL_CAPSULE | Freq: Once | ORAL | Status: AC
  Administered 2022-06-09: 20 mg via ORAL
  Filled 2022-06-09: qty 2

## 2022-06-09 MED ORDER — OFLOXACIN 0.3 % OP SOLN
2.0000 [drp] | Freq: Every day | OPHTHALMIC | Status: DC
  Administered 2022-06-09: 2 [drp] via OTIC
  Filled 2022-06-09: qty 5

## 2022-06-09 MED ORDER — SODIUM CHLORIDE 0.9 % IV BOLUS
1000.0000 mL | Freq: Once | INTRAVENOUS | Status: AC
  Administered 2022-06-09: 1000 mL via INTRAVENOUS

## 2022-06-09 MED ORDER — LOPERAMIDE HCL 2 MG PO CAPS: 2.0000 mg | ORAL_CAPSULE | Freq: Once | ORAL | Status: DC

## 2022-06-09 NOTE — ED Notes (Signed)
Pt d/c home per MD order, discharge summary reviewed, pt verbalizes understanding. No s/s of acute distress noted at discharge. Reports discharge ride home.

## 2022-06-09 NOTE — Telephone Encounter (Signed)
I faxed it on 06/04/22 and got a confirmation from [place]. I will refax now

## 2022-06-09 NOTE — ED Provider Notes (Signed)
  Physical Exam  BP 127/85   Pulse 79   Temp 98.4 F (36.9 C) (Oral)   Resp 14   LMP 05/30/2022   SpO2 100%   Physical Exam  Procedures  Procedures  ED Course / MDM    Medical Decision Making Amount and/or Complexity of Data Reviewed Labs: ordered. Radiology: ordered.  Risk Prescription drug management.   Received patient in signout.  Diarrhea with otitis externa.  Mild hypokalemia.  Had some chest pain but EKG and troponins reassuring.  Doubt cardiac ischemia.  Reviewed previous notes from surgery.  Has had previous gastric bypass and has had diarrhea since.  Reportedly more frequent now.  Mild hypokalemia and slight dehydration.  However do not think she needs admission in the hospital at this time.  Has had quick transit through the small bowel in the past.  Request to follow-up with gastroenterology in [place].  Given information for [facility].  Will discharge home.       Davonna Belling, MD 06/09/22 2023

## 2022-06-09 NOTE — Telephone Encounter (Signed)
I just received fax confirmation from [place] on this order again.

## 2022-06-09 NOTE — ED Provider Notes (Signed)
[facility] EMERGENCY DEPARTMENT Provider Note  CSN: [id number] Arrival date & time: 06/09/22 [id number]  Chief Complaint(s) Weakness and Diarrhea  HPI Elizabeth Bautista is a 47 y.o. female with PMH asthma, rheumatoid arthritis on Plaquenil, seizures who presents to the emergency department for evaluation of multiple complaints including cough, chest pain, abdominal pain, diarrhea and ear pain.  Patient states that she has had persistent diarrhea since her cholecystectomy in September 2023 and over the last 1 week she has had bilateral ear fullness decreased hearing and is feeling a sensation of fluid leaking out of her ears.  She endorses persistent nausea with last episode of vomiting this morning.  Denies abdominal pain or fever.   Past Medical History Past Medical History:  Diagnosis Date   Acute respiratory failure with hypoxia ([place])    Anemia    Asthma    COVID-19 virus infection 05/16/2020   04/27/20 dx covid-19, not hospitalized   Diabetes ([place])    on Metformin   Eczema    Edema    Heart problem    "something with the arteries on the left side of the heart"; upcoming appt with cardiology for evaluation   HTN (hypertension)    Rheumatoid arthritis ([place])    on Plaquenil    Seizures ([place])    last seizure 2019   Sleep apnea    Sleep apnea    Patient Active Problem List   Diagnosis Date Noted   Laryngopharyngeal reflux (LPR) 12/26/2021   History of gastric bypass 10/21/2021   Spinal stenosis of lumbar region with neurogenic claudication 08/27/2021   Fall 08/27/2021   Sinusitis 08/11/2021   Allergic rhinitis 08/07/2021   Hospital discharge follow-up 08/02/2021   (HFpEF) heart failure with preserved ejection fraction ([place]) 07/20/2021   Body mass index (BMI) 45.0-49.9, adult ([place]) 02/05/2021   Iron deficiency anemia due to chronic blood loss 10/17/2020   Tension-type headache, not intractable 10/17/2020   High risk medication use 10/11/2020   Acute hip pain, left 09/19/2020    Sciatica 09/11/2020   Dysmenorrhea 06/04/2020   Menorrhagia with irregular cycle 06/04/2020   Anxiety and depression 06/04/2020   Physical deconditioning 05/24/2020   Severe persistent asthma 03/08/2020   GERD (gastroesophageal reflux disease) 03/07/2020   Anxiety 03/07/2020   History of seizures 03/07/2020   Swelling of lower extremity 02/01/2020   Essential hypertension 02/01/2020   Vitamin D deficiency 02/01/2020   Type 2 diabetes mellitus with hyperglycemia, without long-term current use of insulin ([place]) 02/01/2020   Anemia 02/01/2020   Class 3 severe obesity with serious comorbidity and body mass index (BMI) of 45.0 to 49.9 in adult ([place]) 10/22/2019   OSA on CPAP 10/18/2019   Seropositive rheumatoid arthritis ([place]) 10/18/2019   Home Medication(s) Prior to Admission medications   Medication Sig Start Date End Date Taking? Authorizing Provider  albuterol (PROAIR HFA) 108 (90 Base) MCG/ACT inhaler Inhale 2 puffs into the lungs every 6 (six) hours as needed for wheezing or shortness of breath. 2 puffs every 4 hours as needed only  if your can't catch your breath 05/30/22   Chesley Mires, MD  albuterol (PROVENTIL) (2.5 MG/3ML) 0.083% nebulizer solution Take 3 mLs (2.5 mg total) by nebulization every 6 (six) hours as needed for wheezing or shortness of breath. 05/30/22   Chesley Mires, MD  azelastine (ASTELIN) 0.1 % nasal spray Place 1 spray into both nostrils 2 (two) times daily. Use in each nostril as directed 04/15/22   Chesley Mires, MD  blood glucose  meter kit and supplies Dispense based on patient and insurance preference. Use up to four times daily as directed. (FOR ICD-10 E10.9, E11.9). 02/01/20   Ailene Ards, NP  busPIRone (BUSPAR) 7.5 MG tablet Take 7.5 mg by mouth daily. 01/13/20   [provider]  cetirizine (ZYRTEC) 10 MG tablet TAKE 1 TABLET BY MOUTH EVERY DAY 02/07/22   Martyn Ehrich, NP  Cholecalciferol 1.25 MG (50000 UT) TABS Take 1 tablet by mouth  daily. Patient taking differently: Take 1 capsule by mouth once a week. 02/01/20   Ailene Ards, NP  cyclobenzaprine (FLEXERIL) 10 MG tablet Take 10 mg by mouth daily.    [provider]  DULoxetine (CYMBALTA) 60 MG capsule Take 60 mg by mouth daily. 04/17/21   [provider]  EPINEPHrine 0.3 mg/0.3 mL IJ SOAJ injection Inject 0.3 mg into the muscle as needed for anaphylaxis. 09/26/20   Chesley Mires, MD  Etanercept (ENBREL MINI) 50 MG/ML SOCT Inject 50 mg into the skin once a week. 04/17/22   Collier Salina, MD  famotidine (PEPCID) 20 MG tablet Take 1 tablet (20 mg total) by mouth daily. 04/15/22   Chesley Mires, MD  ferrous sulfate 325 (65 FE) MG tablet TAKE 1 TABLET BY MOUTH TWICE A DAY 01/14/22   [name], MD  FLOWFLEX COVID-19 AG HOME TEST KIT See admin instructions. 03/04/22   [provider]  fluticasone Asencion Islam) 50 MCG/ACT nasal spray 2 sprays in each nostril once a day as needed for nasal congestion 03/07/22   Ambs, Kathrine Cords, FNP  Fluticasone-Umeclidin-Vilant (TRELEGY ELLIPTA) 200-62.5-25 MCG/ACT AEPB Inhale 1 puff into the lungs daily. 05/30/22   Chesley Mires, MD  furosemide (LASIX) 20 MG tablet Take 20 mg by mouth daily.    [provider]  HYDROcodone-acetaminophen (NORCO/VICODIN) 5-325 MG tablet Take 1 tablet by mouth every 4 (four) hours as needed for moderate pain. 11/09/21 11/09/22  Fransico Meadow, PA-C  hydroxychloroquine (PLAQUENIL) 200 MG tablet Take 1 tablet (200 mg total) by mouth 2 (two) times daily. 03/11/22   Collier Salina, MD  levETIRAcetam (KEPPRA) 500 MG tablet Take 1 tablet (500 mg total) by mouth 2 (two) times daily. 03/13/20   Manuella Ghazi, Pratik D, DO  meclizine (ANTIVERT) 12.5 MG tablet Take 12.5 mg by mouth 3 (three) times daily as needed.    [provider]  Mepolizumab (NUCALA) 100 MG/ML SOAJ Inject 100 mg into the skin every 28 (twenty-eight) days.    [provider]  metoCLOPramide (REGLAN) 10 MG tablet Take  10 mg by mouth as needed. 12/30/21   [provider]  Milnacipran HCl (SAVELLA) 12.5 MG TABS Take by mouth. 01/21/22   [provider]  montelukast (SINGULAIR) 10 MG tablet TAKE 1 TABLET BY MOUTH EVERYDAY AT BEDTIME 05/12/22   Ambs, Kathrine Cords, FNP  omeprazole (PRILOSEC) 20 MG capsule Take by mouth. 03/10/22   [provider]  ondansetron (ZOFRAN-ODT) 8 MG disintegrating tablet Take 1 tablet (8 mg total) by mouth every 8 (eight) hours as needed for nausea or vomiting. 08/21/21   Tomi Likens, Adam R, DO  pantoprazole (PROTONIX) 40 MG tablet Take 1 tablet (40 mg total) by mouth daily. Take 30-60 min before first meal of the day 04/15/22   Chesley Mires, MD  pregabalin (LYRICA) 200 MG capsule Take 1 capsule (200 mg total) by mouth 2 (two) times daily. 02/07/22   Bayard Hugger, NP  QUEtiapine (SEROQUEL) 100 MG tablet Take 100 mg by mouth  at bedtime. 06/24/21   [provider]  QUEtiapine (SEROQUEL) 100 MG tablet Take by mouth. 06/24/21   [provider]  rizatriptan (MAXALT-MLT) 10 MG disintegrating tablet Take by mouth. 08/21/21   [provider]  Thiamine HCl (VITAMIN B-1) 250 MG tablet Take by mouth. 04/17/22   [provider]  topiramate (TOPAMAX) 25 MG tablet Take 2 tablets (50 mg total) by mouth at bedtime. 05/27/22   Raulkar, Clide Deutscher, MD  triamcinolone (KENALOG) 0.025 % ointment Apply 1 Application topically 2 (two) times daily. 05/30/22   Chesley Mires, MD  ursodiol (ACTIGALL) 300 MG capsule Take 300 mg by mouth 2 (two) times daily.    [provider]  XIFAXAN 550 MG TABS tablet Take 550 mg by mouth 3 (three) times daily. 05/06/22   [provider]  zonisamide (ZONEGRAN) 100 MG capsule Take 1 capsule (100 mg total) by mouth daily. 09/24/21   Pieter Partridge, DO                                                                                                                                    Past Surgical History Past Surgical History:   Procedure Laterality Date   APPENDECTOMY     CESAREAN SECTION     x3   CHOLECYSTECTOMY  02/17/2022   GASTRIC BYPASS     LAPAROSCOPIC GASTROTOMY W/ REPAIR OF ULCER  02/17/2022   Family History Family History  Problem Relation Age of Onset   High blood pressure Mother    Rheum arthritis Mother    Diabetes Father    Diabetes Sister    Heart Problems Sister    Diabetes Brother    Diabetes Paternal Grandmother    Diabetes Other        father's side "everybody died from Diabetes"   High blood pressure Other        mother's side, multiple siblings with this    Heart attack Other        family member on mother's side    Diabetes Paternal Aunt    Seizures Cousin        not sibings to the other cousins with seizures   Breast cancer Cousin    Seizures Cousin        not sibings to the other cousins with seizures   Seizures Cousin        not sibings to the other cousins with seizures   Cervical cancer Maternal Aunt    Breast cancer Cousin    Dementia Maternal Aunt    Asthma Maternal Aunt    Heart Problems Maternal Grandmother    Diabetes Brother    Asthma Daughter    Angioedema Daughter    Asthma Son     Social History Social History   Tobacco Use   Smoking status: Former    Packs/day: 0.05    Years: 10.00    Total pack years:  0.50    Types: Cigarettes    Quit date: 06/02/2013    Years since quitting: 9.0    Passive exposure: Never   Smokeless tobacco: Never   Tobacco comments:    during the 10 years of smoking, smoked 2-3 cigarettes/day  Vaping Use   Vaping Use: Never used  Substance Use Topics   Alcohol use: Not Currently    Comment: occ   Drug use: Not Currently   Allergies Ferumoxytol, Phenytoin, Pecan nut (diagnostic), and Phenylbutazones  Review of Systems Review of Systems  Constitutional:  Positive for fatigue.  HENT:  Positive for ear discharge and ear pain.   Respiratory:  Positive for shortness of breath.   Cardiovascular:  Positive for chest  pain.  Gastrointestinal:  Positive for diarrhea, nausea and vomiting.    Physical Exam Vital Signs  I have reviewed the triage vital signs BP 118/87   Pulse 93   Temp 98.6 F (37 C) (Oral)   Resp (!) 22   LMP 05/30/2022   SpO2 99%   Physical Exam Vitals and nursing note reviewed.  Constitutional:      General: She is not in acute distress.    Appearance: She is well-developed. She is ill-appearing.  HENT:     Head: Normocephalic and atraumatic.     Ears:     Comments: Bilateral ear canal erythema with discharge Eyes:     Conjunctiva/sclera: Conjunctivae normal.  Cardiovascular:     Rate and Rhythm: Normal rate and regular rhythm.     Heart sounds: No murmur heard. Pulmonary:     Breath sounds: Normal breath sounds.     Comments: Mild tachypnea Abdominal:     Palpations: Abdomen is soft.     Tenderness: There is no abdominal tenderness.  Musculoskeletal:        General: No swelling.     Cervical back: Neck supple.  Skin:    General: Skin is warm and dry.     Capillary Refill: Capillary refill takes less than 2 seconds.  Neurological:     Mental Status: She is alert.  Psychiatric:        Mood and Affect: Mood normal.     ED Results and Treatments Labs (all labs ordered are listed, but only abnormal results are displayed) Labs Reviewed  BASIC METABOLIC PANEL - Abnormal; Notable for the following components:      Result Value   Potassium 3.4 (*)    Glucose, Bld 152 (*)    Calcium 8.5 (*)    All other components within normal limits  RESP PANEL BY RT-PCR (RSV, FLU A&B, COVID)  RVPGX2  CBC  URINALYSIS, ROUTINE W REFLEX MICROSCOPIC  CBG MONITORING, ED                                                                                                                          Radiology No results found.  Pertinent labs & imaging results that were available during my care  of the patient were reviewed by me and considered in my medical decision making (see MDM for  details).  Medications Ordered in ED Medications - No data to display                                                                                                                                   Procedures Procedures  (including critical care time)  Medical Decision Making / ED Course   This patient presents to the ED for concern of ear pain, discharge, chest pain, nausea, vomiting, diarrhea, this involves an extensive number of treatment options, and is a complaint that carries with it a high risk of complications and morbidity.  The differential diagnosis includes influenza, COVID-19, RSV, unspecified viral URI, intra-abdominal infection, pneumonia, otitis media, otitis externa  MDM: Patient seen emergency room for evaluation of multiple complaints as described above.  Physical exam with some mild tachypnea, bilateral canal erythema with otic discharge.  Laboratory evaluation with mild hypokalemia to 3.4 but is otherwise unremarkable.  Ofloxacin ordered for what appears to be otitis externa  bilaterally vs otitis with tympanic rupture.  At time of signout, patient pending fluid resuscitation and troponin, BNP and chest x-ray.  Please see provider signout for continuation of workup.   Additional history obtained:  -External records from outside source obtained and reviewed including: Chart review including previous notes, labs, imaging, consultation notes   Lab Tests: -I ordered, reviewed, and interpreted labs.   The pertinent results include:   Labs Reviewed  BASIC METABOLIC PANEL - Abnormal; Notable for the following components:      Result Value   Potassium 3.4 (*)    Glucose, Bld 152 (*)    Calcium 8.5 (*)    All other components within normal limits  RESP PANEL BY RT-PCR (RSV, FLU A&B, COVID)  RVPGX2  CBC  URINALYSIS, ROUTINE W REFLEX MICROSCOPIC  CBG MONITORING, ED     Imaging Studies ordered: I ordered imaging studies including chest x-ray and this is  pending   Medicines ordered and prescription drug management: No orders of the defined types were placed in this encounter.   -I have reviewed the patients home medicines and have made adjustments as needed  Critical interventions none    Cardiac Monitoring: The patient was maintained on a cardiac monitor.  I personally viewed and interpreted the cardiac monitored which showed an underlying rhythm of: NSR  Social Determinants of Health:  Factors impacting patients care include: none   Reevaluation: After the interventions noted above, I reevaluated the patient and found that they have :improved  Co morbidities that complicate the patient evaluation  Past Medical History:  Diagnosis Date   Acute respiratory failure with hypoxia ([facility])    Anemia    Asthma    COVID-19 virus infection 05/16/2020   04/27/20 dx covid-19, not hospitalized   Diabetes ([facility])    on Metformin   Eczema  Edema    Heart problem    "something with the arteries on the left side of the heart"; upcoming appt with cardiology for evaluation   HTN (hypertension)    Rheumatoid arthritis ([place])    on Plaquenil    Seizures ([place])    last seizure 2019   Sleep apnea    Sleep apnea       Dispostion: I considered admission for this patient, and disposition pending completion of laboratory evaluation.  Please see provider signout for continuation of workup.     Final Clinical Impression(s) / ED Diagnoses Final diagnoses:  None     @PCDICTATION @    Teressa Lower, MD 06/09/22 [phone]

## 2022-06-09 NOTE — ED Triage Notes (Signed)
Pt c/o cough, bilateral ear fullness with fluid coming out and weakness x 1 week. Pt c/o diarrhea daily since gallbladder out in September 2023 but worse this week since been sick. Intermittent vomiting, last time 7am today. Mm wet. Gen weakness noted. Pt c/o "my whole face hurts"

## 2022-06-09 NOTE — Discharge Instructions (Addendum)
Continue the eardrops for 7 days. Try and keep yourself hydrated.  Follow-up with gastroenterology for the dehydration and diarrhea.

## 2022-06-11 ENCOUNTER — Ambulatory Visit: Payer: Medicaid Other | Admitting: Internal Medicine

## 2022-06-12 NOTE — Telephone Encounter (Signed)
Appt r/s for 07/03/2022. Will be able to submit PA renewal after that visit. Pushing out followup  Knox Saliva, PharmD, MPH, BCPS, CPP Clinical Pharmacist (Rheumatology and Pulmonology)

## 2022-06-13 ENCOUNTER — Encounter: Payer: Self-pay | Admitting: Physical Medicine and Rehabilitation

## 2022-06-13 ENCOUNTER — Other Ambulatory Visit: Payer: Self-pay | Admitting: Physical Medicine and Rehabilitation

## 2022-06-13 ENCOUNTER — Telehealth: Payer: Self-pay | Admitting: *Deleted

## 2022-06-13 DIAGNOSIS — G4733 Obstructive sleep apnea (adult) (pediatric): Secondary | ICD-10-CM

## 2022-06-13 DIAGNOSIS — M797 Fibromyalgia: Secondary | ICD-10-CM

## 2022-06-13 NOTE — Telephone Encounter (Signed)
Please let her know I have sent an order to have her CPAP change to a lower pressure setting.

## 2022-06-13 NOTE — Telephone Encounter (Signed)
Called and spoke with patient. She verbalized understanding.   Nothing further needed at time of call.

## 2022-06-13 NOTE — Telephone Encounter (Signed)
Pt called and stated pressures on CPAP machine are to high, provided Dr.Sood with compliance download for pt.

## 2022-06-19 ENCOUNTER — Other Ambulatory Visit (HOSPITAL_COMMUNITY): Payer: Self-pay

## 2022-06-24 ENCOUNTER — Other Ambulatory Visit: Payer: Self-pay

## 2022-06-25 ENCOUNTER — Other Ambulatory Visit (HOSPITAL_COMMUNITY): Payer: Self-pay

## 2022-06-30 NOTE — Progress Notes (Unsigned)
NEUROLOGY FOLLOW UP OFFICE NOTE  [place] [id number]  Assessment/Plan:   Migraine without aura, without status migrainosus, not intractable Memory deficits Left sided numbness   Migraine prevention:  Start zonisamide 25mg  daily and titrate to 100mg  daily.  Would not use beta blocker as she has severe asthma.  Would not use an antidepressant as her current antidepressant regimen is being adjusted by her other physicians.  Would not use topiramate as she already endorses memory deficits and paresthesias. Migraine rescue:  Stop sumatriptan and promethazine.  Start rizatriptan MLT 10mg  and Zofran ODT 8mg  - due to severe nausea and vomiting, will require sublingual form Due to her symptoms (left sided numbness and memory deficits), check MRI of brain with and without contrast and B12 level Limit use of pain relievers to no more than 2 days out of week to prevent risk of rebound or medication-overuse headache. Keep headache diary Follow up 5 months.       Subjective:  [place] is a 47 year old right-handed female with asthma. HTN, DM II, RA, iron-deficiency anemia, sleep apnea and pseudoseizures who follows up for migraines.  UPDATE: Not seen since initial consultation in March 2023.  At that time, I started her on zonisamide.  ***  Current NSAIDS/analgesics:  acetaminophen Current triptans:  rizatriptan 10mg  Current ergotamine:  none Current anti-emetic:  Phenergan 25mg  Current muscle relaxants:  Flexeril Current Antihypertensive medications:  none Current Antidepressant medications:  *** Current Anticonvulsant medications:  topiramate 50mg  QHS Current anti-CGRP:  none Current Antihistamines/Decongestants:  Zyrtec Other therapy:  none Hormone/birth control:  none Other medications:  Plaquenil, Seroquel  Caffeine:  no Alcohol:  occasional Smoker:  no Diet:  Drinks a lot of water.  Planning to have bariatric surgery Exercise:  she does but limited due to chronic  pain (RA and fibromyalgia) Depression:  yes; Anxiety:  yes.  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 CT head on 03/10/2020 personally reviewed normal.  EEG on 03/12/2020 normal. Other pain:  Chronic pain syndrome - has RA but also underlying fibromyalgia suspected.  Followed by physical medicine and rehab Sleep hygiene:  Poor sleep.  May be related to stress.  She has OSA.  CPAP recently adjusted.   HISTORY: History of migraines since childhood but resolved in young adulthood.  They returned around 2021 but progressively has gotten worse.  Severe diffuse squeezing and stabbing headache including the neck.  Back of neck gets stiff.  Nausea, vomiting, blurred vision/or vision goes dark; She saw an ophthalmologist and had an unremarkable eye exam.  Sometimes bilateral hand tingling, photophobia and phonophobia.  Usually last all day.  They occur usually 4 days a week.  No known triggers.  Nothing helps them.  She also reports short term memory problems in 2021 as well.  She says she will sometimes leave the stove on or may forget conversations.  Prior thyroid tests okay.  [place] a couple of weeks ago and hit the back of her head.  Has a lump on back of head.    Past NSAIDS/analgesics:  tramadol, ibuprofen, naproxen Past abortive triptans:  sumatriptan tab Past abortive ergotamine:  none Past muscle relaxants:  none Past anti-emetic:  Reglan, Zofran 8mg  Past antihypertensive medications:  amlodipine, Lasix, HCTZ.  Beta blockers contraindicated due to severe asthma.   Past antidepressant medications:  duloxetine, escitalopram Past anticonvulsant medications:  Keppra, gabapentin, pregabline Past anti-CGRP:  none Past antihistamines/decongestants:  Flonase Other past therapies:  none  Family history of headache:  Mother (migraines)    PAST MEDICAL HISTORY: Past Medical History:  Diagnosis Date   Acute respiratory failure with hypoxia (HCC)    Anemia     Asthma    COVID-19 virus infection 05/16/2020   04/27/20 dx covid-19, not hospitalized   Diabetes (HCC)    on Metformin   Eczema    Edema    Heart problem    "something with the arteries on the left side of the heart"; upcoming appt with cardiology for evaluation   HTN (hypertension)    Rheumatoid arthritis (HCC)    on Plaquenil    Seizures (HCC)    last seizure 2019   Sleep apnea    Sleep apnea     MEDICATIONS: Current Outpatient Medications on File Prior to Visit  Medication Sig Dispense Refill   albuterol (PROAIR HFA) 108 (90 Base) MCG/ACT inhaler Inhale 2 puffs into the lungs every 6 (six) hours as needed for wheezing or shortness of breath. 2 puffs every 4 hours as needed only  if your can't catch your breath 18 g 11   albuterol (PROVENTIL) (2.5 MG/3ML) 0.083% nebulizer solution Take 3 mLs (2.5 mg total) by nebulization every 6 (six) hours as needed for wheezing or shortness of breath. 360 mL 5   Ascorbic Acid (VITAMIN C) 1000 MG tablet Take 1,000 mg by mouth daily.     azelastine (ASTELIN) 0.1 % nasal spray Place 1 spray into both nostrils 2 (two) times daily. Use in each nostril as directed 30 mL 12   blood glucose meter kit and supplies Dispense based on patient and insurance preference. Use up to four times daily as directed. (FOR ICD-10 E10.9, E11.9). 1 each 0   busPIRone (BUSPAR) 7.5 MG tablet Take 7.5 mg by mouth daily.     cetirizine (ZYRTEC) 10 MG tablet TAKE 1 TABLET BY MOUTH EVERY DAY 90 tablet 1   Cholecalciferol 1.25 MG (50000 UT) TABS Take 1 tablet by mouth daily. (Patient taking differently: Take 1 capsule by mouth once a week. Monday) 90 tablet 0   EPINEPHrine 0.3 mg/0.3 mL IJ SOAJ injection Inject 0.3 mg into the muscle as needed for anaphylaxis. 1 each 1   Etanercept (ENBREL MINI) 50 MG/ML SOCT Inject 50 mg into the skin once a week. 12 mL 0   famotidine (PEPCID) 20 MG tablet Take 1 tablet (20 mg total) by mouth daily. 30 tablet 2   ferrous sulfate 325 (65  FE) MG tablet TAKE 1 TABLET BY MOUTH TWICE A DAY 180 tablet 1   FLOWFLEX COVID-19 AG HOME TEST KIT See admin instructions.     fluticasone (FLONASE) 50 MCG/ACT nasal spray 2 sprays in each nostril once a day as needed for nasal congestion 16 g 5   Fluticasone-Umeclidin-Vilant (TRELEGY ELLIPTA) 200-62.5-25 MCG/ACT AEPB Inhale 1 puff into the lungs daily. 28 each 5   furosemide (LASIX) 20 MG tablet Take 20 mg by mouth daily.     hydroxychloroquine (PLAQUENIL) 200 MG tablet Take 1 tablet (200 mg total) by mouth 2 (two) times daily. 60 tablet 2   levETIRAcetam (KEPPRA) 500 MG tablet Take 1 tablet (500 mg total) by mouth 2 (two) times daily. 60 tablet 2   Mepolizumab (NUCALA) 100 MG/ML SOAJ Inject 100 mg into the skin every 28 (twenty-eight) days.     montelukast (SINGULAIR) 10 MG tablet TAKE 1 TABLET BY MOUTH EVERYDAY AT BEDTIME 30 tablet 5   Multiple Vitamin (MULTIVITAMIN) tablet Take 1 tablet by  mouth daily.     omeprazole (PRILOSEC) 20 MG capsule Take by mouth.     pantoprazole (PROTONIX) 40 MG tablet Take 1 tablet (40 mg total) by mouth daily. Take 30-60 min before first meal of the day 30 tablet 2   potassium chloride SA (KLOR-CON M) 20 MEQ tablet Take 1 tablet (20 mEq total) by mouth 2 (two) times daily. 6 tablet 0   pregabalin (LYRICA) 200 MG capsule Take 1 capsule (200 mg total) by mouth 2 (two) times daily. (Patient not taking: Reported on 06/09/2022) 60 capsule 2   rizatriptan (MAXALT-MLT) 10 MG disintegrating tablet Take by mouth.     Thiamine HCl (VITAMIN B-1) 250 MG tablet Take by mouth.     topiramate (TOPAMAX) 25 MG tablet Take 2 tablets (50 mg total) by mouth at bedtime. 90 tablet 3   triamcinolone (KENALOG) 0.025 % ointment Apply 1 Application topically 2 (two) times daily. 30 g 2   No current facility-administered medications on file prior to visit.    ALLERGIES: Allergies  Allergen Reactions   Ferumoxytol Anaphylaxis    ~5 years ago. Tolerated IV venofer   Phenytoin  Anaphylaxis    Other reaction(s): swelling and heart rate decreased   Pecan Nut (Diagnostic) Other (See Comments)   Phenylbutazones Other (See Comments)    FAMILY HISTORY: Family History  Problem Relation Age of Onset   High blood pressure Mother    Rheum arthritis Mother    Diabetes Father    Diabetes Sister    Heart Problems Sister    Diabetes Brother    Diabetes Paternal Grandmother    Diabetes Other        father's side "everybody died from Diabetes"   High blood pressure Other        mother's side, multiple siblings with this    Heart attack Other        family member on mother's side    Diabetes Paternal Aunt    Seizures Cousin        not sibings to the other cousins with seizures   Breast cancer Cousin    Seizures Cousin        not sibings to the other cousins with seizures   Seizures Cousin        not sibings to the other cousins with seizures   Cervical cancer Maternal Aunt    Breast cancer Cousin    Dementia Maternal Aunt    Asthma Maternal Aunt    Heart Problems Maternal Grandmother    Diabetes Brother    Asthma Daughter    Angioedema Daughter    Asthma Son       Objective:  *** General: No acute distress.  Patient appears ***-groomed.   Head:  Normocephalic/atraumatic Eyes:  Fundi examined but not visualized Neck: supple, no paraspinal tenderness, full range of motion Heart:  Regular rate and rhythm Lungs:  Clear to auscultation bilaterally Back: No paraspinal tenderness Neurological Exam: alert and oriented to person, place, and time.  Speech fluent and not dysarthric, language intact.  CN II-XII intact. Bulk and tone normal, muscle strength 5/5 throughout.  Sensation to light touch intact.  Deep tendon reflexes 2+ throughout, toes downgoing.  Finger to nose testing intact.  Gait normal, Romberg negative.   Metta Clines, DO  CC: ***

## 2022-07-01 ENCOUNTER — Encounter: Payer: Self-pay | Admitting: Neurology

## 2022-07-01 ENCOUNTER — Ambulatory Visit (INDEPENDENT_AMBULATORY_CARE_PROVIDER_SITE_OTHER): Payer: Medicare Other | Admitting: Neurology

## 2022-07-01 VITALS — BP 130/83 | HR 61 | Ht 65.0 in | Wt 165.0 lb

## 2022-07-01 DIAGNOSIS — G43019 Migraine without aura, intractable, without status migrainosus: Secondary | ICD-10-CM

## 2022-07-01 MED ORDER — AIMOVIG 140 MG/ML ~~LOC~~ SOAJ: 140.0000 mg | SUBCUTANEOUS | 11 refills | Status: DC

## 2022-07-01 NOTE — Patient Instructions (Addendum)
  Start Aimovig 140mg  every 30 days.  Take Ubrelvy at earliest onset of headache.  May repeat dose once in 2 hours if needed.  Maximum 2 tablets in 24 hours.  Let me know if it works.  Stop rizatriptan Limit use of pain relievers to no more than 2 days out of the week.  These medications include acetaminophen, NSAIDs (ibuprofen/Advil/Motrin, naproxen/Aleve, triptans (Imitrex/sumatriptan), Excedrin, and narcotics.  This will help reduce risk of rebound headaches. Routine exercise Stay adequately hydrated (aim for 64 oz water daily) Keep headache diary Maintain proper stress management Maintain proper sleep hygiene Do not skip meals Consider supplements:  magnesium citrate 400mg  daily, riboflavin 400mg  daily, coenzyme Q10 100mg  three times daily. 11.  Follow up 4 to 5 month.

## 2022-07-03 ENCOUNTER — Ambulatory Visit: Payer: Self-pay | Admitting: Internal Medicine

## 2022-07-03 NOTE — Telephone Encounter (Signed)
Appt r/s 08/05/2022  Knox Saliva, PharmD, MPH, BCPS, CPP Clinical Pharmacist (Rheumatology and Pulmonology)

## 2022-07-07 ENCOUNTER — Ambulatory Visit: Payer: Self-pay | Admitting: Internal Medicine

## 2022-07-09 DIAGNOSIS — H9203 Otalgia, bilateral: Secondary | ICD-10-CM | POA: Insufficient documentation

## 2022-07-15 ENCOUNTER — Other Ambulatory Visit: Payer: Self-pay | Admitting: Neurology

## 2022-07-15 ENCOUNTER — Other Ambulatory Visit: Payer: Self-pay | Admitting: Internal Medicine

## 2022-07-15 ENCOUNTER — Other Ambulatory Visit (HOSPITAL_COMMUNITY): Payer: Self-pay

## 2022-07-15 ENCOUNTER — Encounter: Payer: Self-pay | Admitting: Neurology

## 2022-07-15 DIAGNOSIS — M059 Rheumatoid arthritis with rheumatoid factor, unspecified: Secondary | ICD-10-CM

## 2022-07-15 DIAGNOSIS — Z79899 Other long term (current) drug therapy: Secondary | ICD-10-CM

## 2022-07-15 MED ORDER — UBRELVY 100 MG PO TABS: 1.0000 | ORAL_TABLET | ORAL | 5 refills | Status: DC | PRN

## 2022-07-15 NOTE — Telephone Encounter (Signed)
Next Visit: 08/05/2022  Last Visit: 03/11/2022  Last Fill: 04/17/2022  [id number] rheumatoid arthritis   Current Dose per office note 03/11/2022: Enbrel 50 mg [place] weekly.    Labs: 06/09/2022 Potassium 3.4, Calcium 8.4, Total Protain 6.4, Albumin 3.2  TB Gold: 01/15/2022 Neg    Okay to refill Enbrel?

## 2022-07-16 ENCOUNTER — Other Ambulatory Visit: Payer: Self-pay

## 2022-07-16 ENCOUNTER — Other Ambulatory Visit (HOSPITAL_COMMUNITY): Payer: Self-pay

## 2022-07-16 MED ORDER — ENBREL MINI 50 MG/ML ~~LOC~~ SOCT
50.0000 mg | SUBCUTANEOUS | 0 refills | Status: DC
  Filled 2022-07-16: qty 4, 28d supply, fill #0
  Filled 2022-08-21: qty 4, 28d supply, fill #1
  Filled 2022-09-16: qty 4, 28d supply, fill #2

## 2022-07-21 ENCOUNTER — Other Ambulatory Visit (HOSPITAL_COMMUNITY): Payer: Self-pay

## 2022-07-25 ENCOUNTER — Other Ambulatory Visit (HOSPITAL_COMMUNITY): Payer: Self-pay

## 2022-07-28 ENCOUNTER — Other Ambulatory Visit (HOSPITAL_COMMUNITY): Payer: Self-pay

## 2022-07-28 ENCOUNTER — Other Ambulatory Visit: Payer: Self-pay

## 2022-07-28 ENCOUNTER — Encounter: Payer: Medicare Other | Admitting: Physical Medicine and Rehabilitation

## 2022-07-29 ENCOUNTER — Telehealth: Payer: Self-pay | Admitting: Physical Medicine and Rehabilitation

## 2022-07-29 ENCOUNTER — Encounter: Payer: Medicare Other | Attending: Physical Medicine & Rehabilitation | Admitting: Physical Medicine and Rehabilitation

## 2022-07-29 ENCOUNTER — Other Ambulatory Visit (HOSPITAL_COMMUNITY): Payer: Self-pay

## 2022-07-29 DIAGNOSIS — G4701 Insomnia due to medical condition: Secondary | ICD-10-CM | POA: Diagnosis not present

## 2022-07-29 DIAGNOSIS — M797 Fibromyalgia: Secondary | ICD-10-CM | POA: Insufficient documentation

## 2022-07-29 DIAGNOSIS — R197 Diarrhea, unspecified: Secondary | ICD-10-CM | POA: Diagnosis not present

## 2022-07-29 MED ORDER — TIZANIDINE HCL 2 MG PO CAPS: 2.0000 mg | ORAL_CAPSULE | Freq: Every evening | ORAL | 3 refills | Status: DC
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
## 2022-07-29 NOTE — Telephone Encounter (Signed)
Not needed

## 2022-07-30 ENCOUNTER — Other Ambulatory Visit (HOSPITAL_COMMUNITY): Payer: Self-pay

## 2022-07-31 ENCOUNTER — Encounter: Payer: Self-pay | Admitting: Radiology

## 2022-08-04 ENCOUNTER — Encounter: Payer: Self-pay | Admitting: Physical Medicine and Rehabilitation

## 2022-08-04 ENCOUNTER — Ambulatory Visit: Payer: Self-pay | Admitting: Internal Medicine

## 2022-08-04 NOTE — Progress Notes (Signed)
Office Visit Note  Patient: Elizabeth Bautista             Date of Birth: 11/12/1975           MRN: [id number]             PCP: Pcp, No Referring: Dorna Mai, MD Visit Date: 08/05/2022   Subjective:  No chief complaint on file.   History of Present Illness: Elizabeth Bautista is a 47 y.o. female here for follow up ***   Previous HPI 03/11/22  Elizabeth Bautista is a 47 y.o. female here for follow up for seropositive RA after starting Enbrel 50 mg [place] weekly  doses so far with 6th due today. So far she sees zero benefit with right knee pain and swelling is still severe. She has bruising and itching around each injection site no improvement trying to ice the area or taking benadryl for the itching.   Previous HPI 01/15/2022 Elizabeth Bautista is a 47 y.o. female here for follow up for seropositive RA on HCQ 400 mg daily. We had previously delayed addition of medication possible Enbrel due to surgery and more recently was recovering from medical issues. She saw Dr. Aline Brochure recently and right knee warmth and effusion present with MRI obtained appearing consistent with active inflammatory arthritis. She is having a lot of pain and stiffness in her right knee and exacerbated with walking and weight bearing.   Previous HPI 11/06/21  Elizabeth Bautista is a 47 y.o. 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 She was last seen almost a year ago with discussion for considering addition of Enbrel due to continued pain and inflammation. This was initially delayed due to anticipating upcoming bariatric surgery. She saw Dr. Letta Pate for ongoing pain in multiple areas and felt to represent FMS and she started Lyrica with partial improvement in pain. She was hospitalized in February with severe asthma exacerbation including ICU stay. She had the surgery on 5/15 without major complication. She is off her medications since the surgery and has been struggling some with  maintaining hydration status.   Previous HPI 12/25/2020 Elizabeth Bautista is a 47 y.o. 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 She does not feel the medication has controlled symptoms well she has persistent stiffness in her hands and knees in the mornings and knee pain persists throughout the day. Her wrist has decreased in swelling. Her left shoulder is painful with decreased range of movement limited by pain. She has had asthma exacerbations treated with prednisone but currently trying to avoid this due to upcoming bariatric surgery plans.   Previous HPI: 10/11/20 Elizabeth Bautista is a 47 y.o. female here for evaluation and management of rheumatoid arthritis. Symptoms started at least since 2017 with hand, wrist, and knee pain and swelling periodically and worsened over time. She did not seek medication evaluation for some time due to being very busy with work and symptoms progressively increased. She was hospitalized for severe pain and swelling especially in the left wrist and knee and workup at that time was more extensive and revealed positive RA serology suspected as the cause. She saw rheumatology since 2020 and started treatment initial with prednisone that did not control symptoms well but caused a lot of weight gain. She has had chronic steroid exposure due to refractory asthma symptoms over the years. She also started hydroxychloroquine that apparently helped symptoms reasonably well initially but has  worsened. She moved form [place] a year ago due to safety circumstances and has some family in the area and is continuing HCQ treatment but overall having a lot of joint pain. Currently feels swelling and heat in the joints multiple days per week and walks with either a cane or walker for her left knee pain and instability depending on how badly it is acting up. She was recently hospitalized for asthma exacerbation on 4/28 and discharged 5/2.   Labs  reviewed 10/2018 RF ~30, CCP >250 ANA neg   Imaging reviewed Chest xray 08/2020 Cardiomegaly no airspace disease   Lumbar spine xray 08/2020 No acute findings   No Rheumatology ROS completed.   PMFS History:  Patient Active Problem List   Diagnosis Date Noted   Laryngopharyngeal reflux (LPR) 12/26/2021   History of gastric bypass 10/21/2021   Spinal stenosis of lumbar region with neurogenic claudication 08/27/2021   Fall 08/27/2021   Sinusitis 08/11/2021   Allergic rhinitis 08/07/2021   Hospital discharge follow-up 08/02/2021   (HFpEF) heart failure with preserved ejection fraction ([place]) 07/20/2021   Body mass index (BMI) 45.0-49.9, adult ([place]) 02/05/2021   Iron deficiency anemia due to chronic blood loss 10/17/2020   Tension-type headache, not intractable 10/17/2020   High risk medication use 10/11/2020   Acute hip pain, left 09/19/2020   Sciatica 09/11/2020   Dysmenorrhea 06/04/2020   Menorrhagia with irregular cycle 06/04/2020   Anxiety and depression 06/04/2020   Physical deconditioning 05/24/2020   Severe persistent asthma 03/08/2020   GERD (gastroesophageal reflux disease) 03/07/2020   Anxiety 03/07/2020   History of seizures 03/07/2020   Swelling of lower extremity 02/01/2020   Essential hypertension 02/01/2020   Vitamin D deficiency 02/01/2020   Type 2 diabetes mellitus with hyperglycemia, without long-term current use of insulin ([place]) 02/01/2020   Anemia 02/01/2020   Class 3 severe obesity with serious comorbidity and body mass index (BMI) of 45.0 to 49.9 in adult ([place]) 10/22/2019   OSA on CPAP 10/18/2019   Seropositive rheumatoid arthritis ([place]) 10/18/2019    Past Medical History:  Diagnosis Date   Acute respiratory failure with hypoxia ([place])    Anemia    Asthma    COVID-19 virus infection 05/16/2020   04/27/20 dx covid-19, not hospitalized   Diabetes ([place])    on Metformin   Eczema    Edema    Heart problem    "something with the arteries on the  left side of the heart"; upcoming appt with cardiology for evaluation   HTN (hypertension)    Rheumatoid arthritis ([place])    on Plaquenil    Seizures ([place])    last seizure 2019   Sleep apnea    Sleep apnea     Family History  Problem Relation Age of Onset   High blood pressure Mother    Rheum arthritis Mother    Diabetes Father    Diabetes Sister    Heart Problems Sister    Diabetes Brother    Diabetes Paternal Grandmother    Diabetes Other        father's side "everybody died from Diabetes"   High blood pressure Other        mother's side, multiple siblings with this    Heart attack Other        family member on mother's side    Diabetes Paternal Aunt    Seizures Cousin        not sibings to the other cousins with seizures  Breast cancer Cousin    Seizures Cousin        not sibings to the other cousins with seizures   Seizures Cousin        not sibings to the other cousins with seizures   Cervical cancer Maternal Aunt    Breast cancer Cousin    Dementia Maternal Aunt    Asthma Maternal Aunt    Heart Problems Maternal Grandmother    Diabetes Brother    Asthma Daughter    Angioedema Daughter    Asthma Son    Past Surgical History:  Procedure Laterality Date   APPENDECTOMY     CESAREAN SECTION     x3   CHOLECYSTECTOMY  02/17/2022   GASTRIC BYPASS     LAPAROSCOPIC GASTROTOMY W/ REPAIR OF ULCER  02/17/2022   Social History   Social History Narrative   Divorced.Lives with 3 kids.Originally from [place].Came from [place] 7 months ago.      02/27/2020   Right handed   Caffeine: none     There is no immunization history on file for this patient.   Objective: Vital Signs: There were no vitals taken for this visit.   Physical Exam   Musculoskeletal Exam: ***  CDAI Exam: CDAI Score: -- Patient Global: --; Provider Global: -- Swollen: --; Tender: -- Joint Exam 08/05/2022   No joint exam has been documented for this visit   There is currently no  information documented on the homunculus. Go to the Rheumatology activity and complete the homunculus joint exam.  Investigation: No additional findings.  Imaging: No results found.  Recent Labs: Lab Results  Component Value Date   WBC 7.8 06/09/2022   HGB 12.5 06/09/2022   PLT 200 06/09/2022   NA 135 06/09/2022   K 3.4 (L) 06/09/2022   CL 103 06/09/2022   CO2 22 06/09/2022   GLUCOSE 87 06/09/2022   BUN 8 06/09/2022   CREATININE 0.64 06/09/2022   BILITOT 0.6 06/09/2022   ALKPHOS 85 06/09/2022   AST 20 06/09/2022   ALT 16 06/09/2022   PROT 6.4 (L) 06/09/2022   ALBUMIN 3.2 (L) 06/09/2022   CALCIUM 8.4 (L) 06/09/2022   GFRAA 121 10/11/2020   QFTBGOLDPLUS NEGATIVE 01/15/2022    Speciality Comments: PLQ EYE EXAM 09/12/2021 [place] f/u 2-3 months Enbrel started 01/29/11  Procedures:  No procedures performed Allergies: Ferumoxytol, Phenytoin, Pecan nut (diagnostic), and Phenylbutazones   Assessment / Plan:     Visit Diagnoses: No diagnosis found.  ***  Orders: No orders of the defined types were placed in this encounter.  No orders of the defined types were placed in this encounter.    Follow-Up Instructions: No follow-ups on file.   Collier Salina, MD  Note - This record has been created using [employer].  Chart creation errors have been sought, but may not always  have been located. Such creation errors do not reflect on  the standard of medical care.

## 2022-08-05 ENCOUNTER — Ambulatory Visit: Payer: Medicare Other | Attending: Physical Medicine and Rehabilitation | Admitting: Internal Medicine

## 2022-08-05 ENCOUNTER — Encounter: Payer: Medicare Other | Attending: Physical Medicine & Rehabilitation | Admitting: Physical Medicine and Rehabilitation

## 2022-08-05 ENCOUNTER — Other Ambulatory Visit (HOSPITAL_COMMUNITY): Payer: Self-pay

## 2022-08-05 ENCOUNTER — Other Ambulatory Visit: Payer: Self-pay | Admitting: Primary Care

## 2022-08-05 ENCOUNTER — Encounter: Payer: Self-pay | Admitting: Internal Medicine

## 2022-08-05 ENCOUNTER — Other Ambulatory Visit: Payer: Self-pay | Admitting: Physical Medicine and Rehabilitation

## 2022-08-05 VITALS — BP 125/78 | HR 53 | Resp 12 | Ht 65.0 in | Wt 160.0 lb

## 2022-08-05 DIAGNOSIS — M059 Rheumatoid arthritis with rheumatoid factor, unspecified: Secondary | ICD-10-CM | POA: Diagnosis present

## 2022-08-05 DIAGNOSIS — G4701 Insomnia due to medical condition: Secondary | ICD-10-CM | POA: Insufficient documentation

## 2022-08-05 DIAGNOSIS — I5032 Chronic diastolic (congestive) heart failure: Secondary | ICD-10-CM | POA: Diagnosis present

## 2022-08-05 DIAGNOSIS — M797 Fibromyalgia: Secondary | ICD-10-CM | POA: Diagnosis not present

## 2022-08-05 DIAGNOSIS — R5381 Other malaise: Secondary | ICD-10-CM | POA: Diagnosis present

## 2022-08-05 DIAGNOSIS — Z79899 Other long term (current) drug therapy: Secondary | ICD-10-CM | POA: Diagnosis present

## 2022-08-05 DIAGNOSIS — G894 Chronic pain syndrome: Secondary | ICD-10-CM | POA: Insufficient documentation

## 2022-08-05 DIAGNOSIS — M48062 Spinal stenosis, lumbar region with neurogenic claudication: Secondary | ICD-10-CM | POA: Insufficient documentation

## 2022-08-05 DIAGNOSIS — Z5181 Encounter for therapeutic drug level monitoring: Secondary | ICD-10-CM | POA: Insufficient documentation

## 2022-08-05 DIAGNOSIS — Z79891 Long term (current) use of opiate analgesic: Secondary | ICD-10-CM | POA: Insufficient documentation

## 2022-08-05 DIAGNOSIS — M62838 Other muscle spasm: Secondary | ICD-10-CM | POA: Insufficient documentation

## 2022-08-05 MED ORDER — SAVELLA 12.5 MG PO TABS: 1.0000 | ORAL_TABLET | Freq: Every evening | ORAL | 3 refills | Status: DC
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
## 2022-08-05 NOTE — Telephone Encounter (Signed)
Submitted a Prior Authorization RENEWAL request to [facility] for ENBREL via CoverMyMeds. Will update once we receive a response.  Key: Kathe Mariner, PharmD, MPH, BCPS, CPP Clinical Pharmacist (Rheumatology and Pulmonology)

## 2022-08-06 ENCOUNTER — Encounter (HOSPITAL_BASED_OUTPATIENT_CLINIC_OR_DEPARTMENT_OTHER): Payer: Medicare Other | Admitting: Physical Medicine and Rehabilitation

## 2022-08-06 DIAGNOSIS — M62838 Other muscle spasm: Secondary | ICD-10-CM | POA: Diagnosis not present

## 2022-08-06 LAB — COMPLETE METABOLIC PANEL WITH GFR
AG Ratio: 1.5 (calc) (ref 1.0–2.5)
ALT: 19 U/L (ref 6–29)
AST: 23 U/L (ref 10–35)
Albumin: 3.5 g/dL — ABNORMAL LOW (ref 3.6–5.1)
Alkaline phosphatase (APISO): 96 U/L (ref 31–125)
BUN: 8 mg/dL (ref 7–25)
CO2: 25 mmol/L (ref 20–32)
Calcium: 8.5 mg/dL — ABNORMAL LOW (ref 8.6–10.2)
Chloride: 106 mmol/L (ref 98–110)
Creat: 0.52 mg/dL (ref 0.50–0.99)
Globulin: 2.4 g/dL (calc) (ref 1.9–3.7)
Glucose, Bld: 78 mg/dL (ref 65–99)
Potassium: 3.9 mmol/L (ref 3.5–5.3)
Sodium: 140 mmol/L (ref 135–146)
Total Bilirubin: 0.6 mg/dL (ref 0.2–1.2)
Total Protein: 5.9 g/dL — ABNORMAL LOW (ref 6.1–8.1)
eGFR: 115 mL/min/{1.73_m2} (ref >=60)

## 2022-08-06 LAB — CBC WITH DIFFERENTIAL/PLATELET
Absolute Monocytes: 345 cells/uL (ref 200–950)
Basophils Absolute: 32 cells/uL (ref 0–200)
Basophils Relative: 0.7 %
Eosinophils Absolute: 212 cells/uL (ref 15–500)
Eosinophils Relative: 4.6 %
HCT: 36.7 % (ref 35.0–45.0)
Hemoglobin: 11.6 g/dL — ABNORMAL LOW (ref 11.7–15.5)
Lymphs Abs: 1983 cells/uL (ref 850–3900)
MCH: 27.1 pg (ref 27.0–33.0)
MCHC: 31.6 g/dL — ABNORMAL LOW (ref 32.0–36.0)
MCV: 85.7 fL (ref 80.0–100.0)
MPV: 12.5 fL (ref 7.5–12.5)
Monocytes Relative: 7.5 %
Neutro Abs: 2029 cells/uL (ref 1500–7800)
Neutrophils Relative %: 44.1 %
Platelets: 206 10*3/uL (ref 140–400)
RBC: 4.28 10*6/uL (ref 3.80–5.10)
RDW: 13.4 % (ref 11.0–15.0)
Total Lymphocyte: 43.1 %
WBC: 4.6 10*3/uL (ref 3.8–10.8)

## 2022-08-06 LAB — C-REACTIVE PROTEIN: CRP: 1 mg/L (ref <8.0)

## 2022-08-06 LAB — SEDIMENTATION RATE: Sed Rate: 11 mm/h (ref 0–20)

## 2022-08-06 MED ORDER — BACLOFEN 10 MG PO TABS: 10.0000 mg | ORAL_TABLET | Freq: Every evening | ORAL | 0 refills | Status: DC | PRN

## 2022-08-06 NOTE — Telephone Encounter (Signed)
Received notification from [facility] regarding a prior authorization for ENBREL. Authorization has been APPROVED from 07/22/2022 until further notice. Approval letter sent to scan center.  Patient can continue to fill through [place]: [phone]   Authorization # [id number]  [employer] updated  Knox Saliva, PharmD, MPH, BCPS, CPP Clinical Pharmacist (Rheumatology and Pulmonology)

## 2022-08-06 NOTE — Progress Notes (Signed)
Sed rate and CRP remain in normal range since last month so I think the Enbrel is working well for her RA overall. She may need to follow up with Dr. Aline Brochure about whether any procedure is an option for the right knee. I will send him a message with our note about considering this too.
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
## 2022-08-13 ENCOUNTER — Other Ambulatory Visit (HOSPITAL_COMMUNITY): Payer: Self-pay

## 2022-08-14 ENCOUNTER — Encounter: Payer: Self-pay | Admitting: Adult Health

## 2022-08-14 ENCOUNTER — Ambulatory Visit (INDEPENDENT_AMBULATORY_CARE_PROVIDER_SITE_OTHER): Payer: Medicare Other | Admitting: Adult Health

## 2022-08-14 VITALS — BP 123/84 | HR 74 | Ht 65.0 in | Wt 162.5 lb

## 2022-08-14 DIAGNOSIS — Z113 Encounter for screening for infections with a predominantly sexual mode of transmission: Secondary | ICD-10-CM | POA: Diagnosis not present

## 2022-08-14 DIAGNOSIS — N3946 Mixed incontinence: Secondary | ICD-10-CM | POA: Diagnosis not present

## 2022-08-14 DIAGNOSIS — R6882 Decreased libido: Secondary | ICD-10-CM

## 2022-08-14 DIAGNOSIS — Z1211 Encounter for screening for malignant neoplasm of colon: Secondary | ICD-10-CM | POA: Insufficient documentation

## 2022-08-14 DIAGNOSIS — N951 Menopausal and female climacteric states: Secondary | ICD-10-CM

## 2022-08-14 DIAGNOSIS — Z01419 Encounter for gynecological examination (general) (routine) without abnormal findings: Secondary | ICD-10-CM

## 2022-08-14 DIAGNOSIS — Z1339 Encounter for screening examination for other mental health and behavioral disorders: Secondary | ICD-10-CM | POA: Diagnosis not present

## 2022-08-14 NOTE — Progress Notes (Signed)
Patient ID: Elizabeth Bautista, female   DOB: [birth year], 47 y.o.   MRN: [id number] History of Present Illness: Elizabeth Bautista is a 47 year old black female, single, [id number] in for a well woman gyn exam. She has had "bad headaches" and has seen neurologist and has started new meds. She complains of urinary incontinence  if coughs or has to go.  Has decreased libido, and last period was heavier, had been light, has hot flashes and night sweats.   Last pap was negative HPV,NILM 08/08/21.  PCP is Sydell Axon NP    Current Medications, Allergies, Past Medical History, Past Surgical History, Family History and Social History were reviewed in [employer] record.     Review of Systems: Patient denies any  hearing loss, fatigue, blurred vision, shortness of breath, chest pain, abdominal pain, problems with bowel movements,  or intercourse(has not had lately). No joint pain or mood swings.  See HPI for positives   Physical Exam:BP 123/84 (BP Location: Left Arm, Patient Position: Sitting, Cuff Size: Normal)   Pulse 74   Ht '5\' 5"'$  (1.651 m)   Wt 162 lb 8 oz (73.7 kg)   LMP 08/08/2022   BMI 27.04 kg/m   General:  Well developed, well nourished, no acute distress,using cane Skin:  Warm and dry Neck:  Midline trachea, normal thyroid, good ROM, no lymphadenopathy Lungs; Clear to auscultation bilaterally Breast:  No dominant palpable mass, retraction, or nipple discharge Cardiovascular: Regular rate and rhythm Abdomen:  Soft, non tender, no hepatosplenomegaly Pelvic:  External genitalia is normal in appearance, no lesions.  The vagina is normal in appearance. Urethra has no lesions or masses. The cervix is bulbous.  Uterus is felt to be normal size, shape, and contour.  No adnexal masses or tenderness noted.Bladder is non tender, no masses felt. Rectal:Deferred  Extremities/musculoskeletal:  No swelling or varicosities noted, no clubbing or cyanosis Psych:  No mood changes, alert and  cooperative,seems happy AA is 0 Fall risk is low    08/14/2022    8:38 AM 05/27/2022    9:22 AM 01/24/2022    8:13 AM  Depression screen PHQ 2/9  Decreased Interest '3 1 3  '$ Down, Depressed, Hopeless '3 1 3  '$ PHQ - 2 Score '6 2 6  '$ Altered sleeping 3  3  Tired, decreased energy 3    Change in appetite 3  2  Feeling bad or failure about yourself  3  3  Trouble concentrating 3  3  Moving slowly or fidgety/restless 2  2  Suicidal thoughts 2  1  PHQ-9 Score 25  20   She is on meds and is going to see a new Mental Health Provider in [place]     08/14/2022    8:38 AM 08/08/2021    2:47 PM 09/26/2020    5:58 AM 06/04/2020   10:10 AM  GAD 7 : Generalized Anxiety Score  Nervous, Anxious, on Edge '3 3 2 3  '$ Control/stop worrying '3 3 3 3  '$ Worry too much - different things '3 3 3 3  '$ Trouble relaxing '3 3 2 3  '$ Restless '3 3 2 3  '$ Easily annoyed or irritable '3 3 3 3  '$ Afraid - awful might happen '3 3 3 3  '$ Total GAD 7 Score '21 21 18 21  '$ Anxiety Difficulty   Very difficult       [place] - 08/14/22 0844       Pregnancy Intention Screening   Does the patient want to  become pregnant in the next year? No    Does the patient's partner want to become pregnant in the next year? No    Would the patient like to discuss contraceptive options today? No      Contraception Wrap Up   Current Method Female Sterilization    End Method Female Sterilization    Contraception Counseling Provided No             Examination chaperoned by Levy Pupa LPN   Impression and Plan: 1. Encounter for well woman exam with routine gynecological exam Pap and physical in 1 year Mammogram was negative 09/05/21 Colonoscopy was last month  2. Mixed stress and urge urinary incontinence Will refer to urology - Ambulatory referral to Urology  3. Screening examination for STD (sexually transmitted disease) Had HIV 07/3021 negative  - RPR  4. Perimenopause Discussed symptoms and refer handouts  5. Decreased  libido Can be related to perimenopause and some of her medications But has new friend, and will and is thinking of having sex again

## 2022-08-15 LAB — RPR: RPR Ser Ql: NONREACTIVE

## 2022-08-18 ENCOUNTER — Other Ambulatory Visit: Payer: Self-pay | Admitting: Physical Medicine and Rehabilitation
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
## 2022-08-19 ENCOUNTER — Encounter (HOSPITAL_BASED_OUTPATIENT_CLINIC_OR_DEPARTMENT_OTHER): Payer: Medicare Other | Admitting: Physical Medicine and Rehabilitation

## 2022-08-19 ENCOUNTER — Encounter: Payer: Self-pay | Admitting: Physical Medicine and Rehabilitation

## 2022-08-19 VITALS — BP 152/88 | HR 50 | Ht 65.0 in | Wt 160.0 lb

## 2022-08-19 DIAGNOSIS — Z79891 Long term (current) use of opiate analgesic: Secondary | ICD-10-CM

## 2022-08-19 DIAGNOSIS — G894 Chronic pain syndrome: Secondary | ICD-10-CM | POA: Diagnosis present

## 2022-08-19 DIAGNOSIS — M797 Fibromyalgia: Secondary | ICD-10-CM | POA: Diagnosis not present

## 2022-08-19 DIAGNOSIS — G4701 Insomnia due to medical condition: Secondary | ICD-10-CM | POA: Insufficient documentation

## 2022-08-19 DIAGNOSIS — Z5181 Encounter for therapeutic drug level monitoring: Secondary | ICD-10-CM | POA: Insufficient documentation

## 2022-08-19 DIAGNOSIS — M62838 Other muscle spasm: Secondary | ICD-10-CM | POA: Insufficient documentation

## 2022-08-19 DIAGNOSIS — M48062 Spinal stenosis, lumbar region with neurogenic claudication: Secondary | ICD-10-CM

## 2022-08-19 MED ORDER — ESZOPICLONE 1 MG PO TABS: 1.0000 mg | ORAL_TABLET | Freq: Every evening | ORAL | 3 refills | Status: AC | PRN

## 2022-08-20 DIAGNOSIS — Z8669 Personal history of other diseases of the nervous system and sense organs: Secondary | ICD-10-CM | POA: Insufficient documentation

## 2022-08-20 DIAGNOSIS — G894 Chronic pain syndrome: Secondary | ICD-10-CM | POA: Insufficient documentation

## 2022-08-20 DIAGNOSIS — M797 Fibromyalgia: Secondary | ICD-10-CM | POA: Insufficient documentation

## 2022-08-20 DIAGNOSIS — Z79899 Other long term (current) drug therapy: Secondary | ICD-10-CM | POA: Insufficient documentation

## 2022-08-21 ENCOUNTER — Other Ambulatory Visit (HOSPITAL_COMMUNITY): Payer: Self-pay

## 2022-08-22 ENCOUNTER — Other Ambulatory Visit: Payer: Self-pay | Admitting: Physical Medicine and Rehabilitation

## 2022-08-22 ENCOUNTER — Other Ambulatory Visit: Payer: Self-pay

## 2022-08-22 LAB — TOXASSURE SELECT,+ANTIDEPR,UR

## 2022-08-22 MED ORDER — ACETAMINOPHEN-CODEINE 300-30 MG PO TABS: 1.0000 | ORAL_TABLET | Freq: Three times a day (TID) | ORAL | 0 refills | Status: DC | PRN

## 2022-08-25 ENCOUNTER — Encounter: Payer: Self-pay | Admitting: Physical Medicine and Rehabilitation

## 2022-08-25 ENCOUNTER — Telehealth: Payer: Self-pay

## 2022-08-25 ENCOUNTER — Other Ambulatory Visit: Payer: Self-pay | Admitting: Physical Medicine and Rehabilitation

## 2022-08-25 MED ORDER — ACETAMINOPHEN-CODEINE 300-30 MG PO TABS: 1.0000 | ORAL_TABLET | Freq: Three times a day (TID) | ORAL | 0 refills | Status: DC | PRN

## 2022-08-25 NOTE — Telephone Encounter (Signed)
Pharmacy does not have Acetaminophen - Codeine #3 in stock. Patient has been advised  to call her local pharmacy for availability?   Call back phone [phone].  Please advise.

## 2022-08-26 NOTE — Telephone Encounter (Signed)
I called back for follow up pharmacy location. Per Patient its already been taken care of.

## 2022-08-28 ENCOUNTER — Ambulatory Visit: Payer: Medicare Other | Admitting: Orthopedic Surgery

## 2022-08-30 ENCOUNTER — Encounter: Payer: Self-pay | Admitting: Physical Medicine and Rehabilitation

## 2022-09-02 ENCOUNTER — Telehealth: Payer: Self-pay

## 2022-09-02 NOTE — Telephone Encounter (Signed)
PA FOR Tylenol #3 submitted on 09/02/2022 in [employer].

## 2022-09-02 NOTE — Telephone Encounter (Signed)
PA request received via CMM for Albuterol Sulfate (2.5 MG/3ML)0.083% nebulizer solution  PA has been submitted to [facility] Medicare and is pending determination  Key: [id number]

## 2022-09-03 NOTE — Telephone Encounter (Signed)
Per [facility] she will need to do the 7 day supply. Then the 90 day supply with a new Rx.   Dr. Ranell Patrick is out of the office this week. So  [facility] are willing to fill the remainder of the Rx on 09/06/2022.  For #69 "without" a new Rx. Patient informed. She has been asked to call the pharmacy for verification. Also to call PM&R back if there are any issues. Marland Kitchen

## 2022-09-04 NOTE — Telephone Encounter (Signed)
PA has been DENIED due to:   Denied. ALBUTEROL SULFATE Nebu Soln is used in a nebulizer. A nebulizer is a piece of durable medical equipment (DME). Drugs used with DME in the home are covered under Medicare Part B.

## 2022-09-05 ENCOUNTER — Telehealth: Payer: Self-pay | Admitting: *Deleted

## 2022-09-05 NOTE — Telephone Encounter (Signed)
Urine drug screen for this encounter is consistent for no drugs detected, as expected.

## 2022-09-08 ENCOUNTER — Telehealth: Payer: Self-pay | Admitting: Pulmonary Disease

## 2022-09-08 ENCOUNTER — Telehealth: Payer: Self-pay | Admitting: Internal Medicine

## 2022-09-08 DIAGNOSIS — G4733 Obstructive sleep apnea (adult) (pediatric): Secondary | ICD-10-CM

## 2022-09-08 DIAGNOSIS — J4540 Moderate persistent asthma, uncomplicated: Secondary | ICD-10-CM

## 2022-09-08 MED ORDER — IPRATROPIUM-ALBUTEROL 0.5-2.5 (3) MG/3ML IN SOLN: 3.0000 mL | Freq: Four times a day (QID) | RESPIRATORY_TRACT | 11 refills | Status: AC | PRN

## 2022-09-08 MED ORDER — TRELEGY ELLIPTA 200-62.5-25 MCG/ACT IN AEPB: 1.0000 | INHALATION_SPRAY | Freq: Every day | RESPIRATORY_TRACT | 5 refills | Status: AC

## 2022-09-08 MED ORDER — ALBUTEROL SULFATE HFA 108 (90 BASE) MCG/ACT IN AERS: 2.0000 | INHALATION_SPRAY | Freq: Four times a day (QID) | RESPIRATORY_TRACT | 11 refills | Status: AC | PRN

## 2022-09-08 MED ORDER — MONTELUKAST SODIUM 10 MG PO TABS: ORAL_TABLET | ORAL | 11 refills | Status: AC

## 2022-09-08 NOTE — Telephone Encounter (Signed)
Pt called the office stating that she is needing to have her cpap settings turned down again as it is still too much for her. States that she has not been wearing her cpap machine due to it being uncomfortable when she has tried wearing it.  Dr. Craige Cotta, please advise on this for pt.

## 2022-09-08 NOTE — Telephone Encounter (Signed)
Patient states [facility] does not have the order for coap settings change. Patient called and they do not have the change. Patient phone number is [phone].

## 2022-09-08 NOTE — Telephone Encounter (Signed)
Patient has been discharged from practice, she said her pharmacy told her to call our office patient needs to know the name of her glucose currently taking. Call back # [phone]

## 2022-09-08 NOTE — Telephone Encounter (Signed)
Order has been placed to change patients cpap settings.   Nothing further needed.

## 2022-09-08 NOTE — Telephone Encounter (Signed)
ATC patient. LVMTCB. 

## 2022-09-08 NOTE — Telephone Encounter (Signed)
Faxed to [facility] 

## 2022-09-08 NOTE — Telephone Encounter (Signed)
Order was sent to [facility] instead. PCCs, can we send this order to [place] instead? Thanks!

## 2022-09-08 NOTE — Telephone Encounter (Signed)
Pt called the office and I refilled all meds that she needed to have refilled. Nothing further needed.

## 2022-09-08 NOTE — Telephone Encounter (Signed)
Patient states needs all asthma medications refilled. Pharmacy is [facility] . Patient phone number is [phone].

## 2022-09-08 NOTE — Telephone Encounter (Signed)
Spoke to patient

## 2022-09-08 NOTE — Telephone Encounter (Signed)
Okay to send order to have CPAP changed to 7 cm H2O.

## 2022-09-09 ENCOUNTER — Telehealth: Payer: Self-pay

## 2022-09-09 NOTE — Telephone Encounter (Signed)
PA request received via CMM for Ipratropium-Albuterol 0.5-2.5 (3)MG/3ML solution  PA not submitted due to medication being covered under Medicare Part B  Key: [id number]

## 2022-09-11 ENCOUNTER — Ambulatory Visit (INDEPENDENT_AMBULATORY_CARE_PROVIDER_SITE_OTHER): Payer: Medicare Other | Admitting: Orthopedic Surgery

## 2022-09-11 ENCOUNTER — Encounter: Payer: Self-pay | Admitting: Orthopedic Surgery

## 2022-09-11 VITALS — Ht 65.0 in | Wt 158.0 lb

## 2022-09-11 DIAGNOSIS — M05761 Rheumatoid arthritis with rheumatoid factor of right knee without organ or systems involvement: Secondary | ICD-10-CM

## 2022-09-11 NOTE — Progress Notes (Signed)
Chief Complaint  Patient presents with   Knee Pain    R knee pain about the same, has been seeing a Rheumatologist who is suggesting she moves forward with surgery.     47 year old female with rheumatoid arthritis seropositive presents for evaluation of possible synovectomy right knee  Patient tells me she is having severe pain in her right knee with intermittent swelling.  She says she is unable.  She says she is out of the medication including prednisone and she had injections in the knee  Talking to her more it seems like her swelling is intermittent although her pain is constant  I do not think that the surgical procedure synovectomy will help her after looking at her MRI and reviewing her signs and symptoms with her.  She still has a functional range of motion in the knee and it sounds like she just needs management for the pain  She agrees that surgery not indicated less than the swelling is constant  I did reexamine her knee today it was not warm and was not swollen there is no effusion she can fully straighten the knee and bend it 120 degrees with stable  Encounter Diagnosis  Name Primary?   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

## 2022-09-12 ENCOUNTER — Encounter: Payer: Self-pay | Admitting: Neurology

## 2022-09-12 ENCOUNTER — Other Ambulatory Visit: Payer: Self-pay | Admitting: Neurology

## 2022-09-12 ENCOUNTER — Telehealth: Payer: Self-pay

## 2022-09-12 NOTE — Telephone Encounter (Signed)
PA request received via CMM for Aimovig 140MG /ML auto-injectors  PA has been submitted to [facility] and is pending determination.  Key: [id number]

## 2022-09-15 ENCOUNTER — Telehealth: Payer: Self-pay

## 2022-09-15 NOTE — Telephone Encounter (Signed)
Per patient her Bernita Raisin needs a PA and to check the status of Aimovig PA.

## 2022-09-16 ENCOUNTER — Other Ambulatory Visit (HOSPITAL_COMMUNITY): Payer: Self-pay

## 2022-09-17 ENCOUNTER — Other Ambulatory Visit (HOSPITAL_COMMUNITY): Payer: Self-pay

## 2022-09-17 ENCOUNTER — Other Ambulatory Visit: Payer: Self-pay

## 2022-09-22 ENCOUNTER — Encounter: Payer: Self-pay | Admitting: Physical Medicine and Rehabilitation

## 2022-09-22 ENCOUNTER — Encounter: Payer: Medicare Other | Attending: Physical Medicine & Rehabilitation | Admitting: Physical Medicine and Rehabilitation

## 2022-09-22 ENCOUNTER — Telehealth: Payer: Self-pay | Admitting: *Deleted

## 2022-09-22 VITALS — BP 137/81 | HR 63 | Ht 65.0 in | Wt 151.2 lb

## 2022-09-22 DIAGNOSIS — G894 Chronic pain syndrome: Secondary | ICD-10-CM | POA: Insufficient documentation

## 2022-09-22 DIAGNOSIS — L659 Nonscarring hair loss, unspecified: Secondary | ICD-10-CM | POA: Diagnosis present

## 2022-09-22 DIAGNOSIS — R197 Diarrhea, unspecified: Secondary | ICD-10-CM | POA: Diagnosis present

## 2022-09-22 MED ORDER — ACETAMINOPHEN-CODEINE 300-60 MG PO TABS: 1.0000 | ORAL_TABLET | ORAL | 0 refills | Status: DC | PRN

## 2022-09-22 MED ORDER — MAGNESIUM CITRATE PO SOLN: 1.0000 | Freq: Once | ORAL | 3 refills | Status: AC

## 2022-09-22 NOTE — Telephone Encounter (Signed)
Please resend Tylenol #4 to [facility] I have added pharmacy.

## 2022-09-22 NOTE — Addendum Note (Signed)
Addended by: Horton Chin on: 09/22/2022 05:22 PM   Modules accepted: Orders
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
## 2022-09-22 NOTE — Telephone Encounter (Signed)
[facility] does not have Tylenol #4 and can not get it. Please sent to alternate pharmacy.

## 2022-09-22 NOTE — Telephone Encounter (Signed)
Patient will call [facility] and call office back.

## 2022-09-22 NOTE — Telephone Encounter (Signed)
PA Approved

## 2022-09-22 NOTE — Patient Instructions (Signed)
biotin supplement, or [place] nuts for selenium

## 2022-09-24 ENCOUNTER — Encounter: Payer: Self-pay | Admitting: Dermatology

## 2022-09-24 ENCOUNTER — Ambulatory Visit: Payer: Medicare Other | Admitting: Dermatology

## 2022-09-24 VITALS — BP 127/84 | HR 69

## 2022-09-24 DIAGNOSIS — L814 Other melanin hyperpigmentation: Secondary | ICD-10-CM

## 2022-09-24 DIAGNOSIS — L910 Hypertrophic scar: Secondary | ICD-10-CM | POA: Diagnosis not present

## 2022-09-24 DIAGNOSIS — D2272 Melanocytic nevi of left lower limb, including hip: Secondary | ICD-10-CM

## 2022-09-24 DIAGNOSIS — D2271 Melanocytic nevi of right lower limb, including hip: Secondary | ICD-10-CM | POA: Diagnosis not present

## 2022-09-24 DIAGNOSIS — L700 Acne vulgaris: Secondary | ICD-10-CM

## 2022-09-24 DIAGNOSIS — L821 Other seborrheic keratosis: Secondary | ICD-10-CM

## 2022-09-24 DIAGNOSIS — D239 Other benign neoplasm of skin, unspecified: Secondary | ICD-10-CM

## 2022-09-24 DIAGNOSIS — Z1283 Encounter for screening for malignant neoplasm of skin: Secondary | ICD-10-CM

## 2022-09-24 DIAGNOSIS — Q825 Congenital non-neoplastic nevus: Secondary | ICD-10-CM

## 2022-09-24 DIAGNOSIS — D489 Neoplasm of uncertain behavior, unspecified: Secondary | ICD-10-CM

## 2022-09-24 DIAGNOSIS — Z79899 Other long term (current) drug therapy: Secondary | ICD-10-CM

## 2022-09-24 DIAGNOSIS — D229 Melanocytic nevi, unspecified: Secondary | ICD-10-CM

## 2022-09-24 HISTORY — DX: Other benign neoplasm of skin, unspecified: D23.9

## 2022-09-24 MED ORDER — TRETINOIN 0.025 % EX CREA: TOPICAL_CREAM | CUTANEOUS | 11 refills | Status: DC

## 2022-09-24 NOTE — Progress Notes (Signed)
Follow-Up Visit   Subjective  Elizabeth Bautista is a 47 y.o. female new patient  who presents for the following: Skin Cancer Screening and Full Body Skin Exam. Patient referred by rheumatologist given their history of rheumatoid arthritis and taking Enbrel.  Patient also has some bumps face and spots at abdomen she would like treated.  The patient presents for Total-Body Skin Exam (TBSE) for skin cancer screening and mole check. The patient has spots, moles and lesions to be evaluated, some may be new or changing and the patient has concerns that these could be cancer.  The following portions of the chart were reviewed this encounter and updated as appropriate: medications, allergies, medical history  Review of Systems:  No other skin or systemic complaints except as noted in HPI or Assessment and Plan.  Objective  Well appearing patient in no apparent distress; mood and affect are within normal limits.  A full examination was performed including scalp, head, eyes, ears, nose, lips, neck, chest, axillae, abdomen, back, buttocks, bilateral upper extremities, bilateral lower extremities, hands, feet, fingers, toes, fingernails, and toenails. All findings within normal limits unless otherwise noted below.   Relevant physical exam findings are noted in the Assessment and Plan.  abdomen         right anterior sole 1.1 x 0.5 cm brown patch          left anterior sole 0.6 cm dark brown macule            Assessment & Plan   ACNE VULGARIS Exam: open and closed comedones confluent at cheeks Chronic and persistent condition with duration or expected duration over one year. Condition is bothersome/symptomatic for patient. Currently flared. Treatment Plan: Start tretinoin 0.025 use pea sized amount to cheeks at qhs Can use with moisturizer such as Cerave  LENTIGINES,  - Benign normal skin lesions - Benign-appearing - Call for any changes  SEBORRHEIC KERATOSIS -  Stuck-on, waxy, tan-brown papules and/or plaques  - Benign-appearing - Discussed benign etiology and prognosis. - Observe - Call for any changes  MELANOCYTIC NEVI - Tan-brown and/or pink-flesh-colored symmetric macules and papules - Benign appearing on exam today - Observation - Call clinic for new or changing moles - Recommend daily use of broad spectrum spf 30+ sunscreen to sun-exposed areas.  Scattered brown macules on left and right sole   SKIN CANCER SCREENING PERFORMED TODAY.  Marland KitchenAcne vulgaris  Related Medications tretinoin (RETIN-A) 0.025 % cream Apply pea sized amount to face qhs for acne  Keloid abdomen Keloids from Hx of gastric bypass surgery and gallbladder surgery   NDC [id number] Lot [id number] March 2026  Intralesional injection - abdomen Location: abdomen  Informed Consent: Discussed risks (infection, pain, bleeding, bruising, thinning of the skin, loss of skin pigment, lack of resolution, and recurrence of lesion) and benefits of the procedure, as well as the alternatives. Informed consent was obtained. Preparation: The area was prepared a standard fashion.  Procedure Details: An intralesional injection was performed with Kenalog 10 mg/cc. 0.3 cc in total were injected.  Total number of injections: 8  Plan: The patient was instructed on post-op care. Recommend OTC analgesia as needed for pain.  Neoplasm of uncertain behavior right anterior sole See photo Epidermal / dermal shaving  Lesion diameter (cm):  1.1 Informed consent: discussed and consent obtained   Timeout: patient name, date of birth, surgical site, and procedure verified   Procedure prep:  Patient was prepped and draped in usual sterile fashion Prep  type:  Isopropyl alcohol Anesthesia: the lesion was anesthetized in a standard fashion   Anesthetic:  1% lidocaine w/ epinephrine 1-100,000 buffered w/ 8.4% NaHCO3 Instrument used: flexible razor blade   Hemostasis achieved with: pressure,  aluminum chloride and electrodesiccation   Outcome: patient tolerated procedure well   Post-procedure details: sterile dressing applied and wound care instructions given   Dressing type: bandage and petrolatum    Specimen 1 - Surgical pathology Differential Diagnosis: r/o atypia Check Margins: No R/o atypia   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 Observe Will continue to monitor  Return for 3 month follow up on keloid and acne.  IAsher Muir, CMA, am acting as scribe for Armida Sans, MD.  Documentation: I have reviewed the above documentation for accuracy and completeness, and I agree with the above.  Armida Sans, MD

## 2022-09-24 NOTE — Patient Instructions (Addendum)
For acne  Start tretinoin 0.025 cream - apply pea sized amount to cheeks nightly for acne  Discontinue vaseline as a moisturizer to face  Start cerave cream as a daily moisturizer to face  Topical retinoid medications like tretinoin/Retin-A, adapalene/Differin, tazarotene/Fabior, and Epiduo/Epiduo Forte can cause dryness and irritation when first started. Only apply a pea-sized amount to the entire affected area. Avoid applying it around the eyes, edges of mouth and creases at the nose. If you experience irritation, use a good moisturizer first and/or apply the medicine less often. If you are doing well with the medicine, you can increase how often you use it until you are applying every night. Be careful with sun protection while using this medication as it can make you sensitive to the sun. This medicine should not be used by pregnant women.       Biopsy Wound Care Instructions  Leave the original bandage on for 24 hours if possible.  If the bandage becomes soaked or soiled before that time, it is OK to remove it and examine the wound.  A small amount of post-operative bleeding is normal.  If excessive bleeding occurs, remove the bandage, place gauze over the site and apply continuous pressure (no peeking) over the area for 30 minutes. If this does not work, please call our clinic as soon as possible or page your doctor if it is after hours.   Once a day, cleanse the wound with soap and water. It is fine to shower. If a thick crust develops you may use a Q-tip dipped into dilute hydrogen peroxide (mix 1:1 with water) to dissolve it.  Hydrogen peroxide can slow the healing process, so use it only as needed.    After washing, apply petroleum jelly (Vaseline) or an antibiotic ointment if your doctor prescribed one for you, followed by a bandage.    For best healing, the wound should be covered with a layer of ointment at all times. If you are not able to keep the area covered with a bandage to  hold the ointment in place, this may mean re-applying the ointment several times a day.  Continue this wound care until the wound has healed and is no longer open.   Itching and mild discomfort is normal during the healing process. However, if you develop pain or severe itching, please call our office.   If you have any discomfort, you can take Tylenol (acetaminophen) or ibuprofen as directed on the bottle. (Please do not take these if you have an allergy to them or cannot take them for another reason).  Some redness, tenderness and white or yellow material in the wound is normal healing.  If the area becomes very sore and red, or develops a thick yellow-green material (pus), it may be infected; please notify us.    If you have stitches, return to clinic as directed to have the stitches removed. You will continue wound care for 2-3 days after the stitches are removed.   Wound healing continues for up to one year following surgery. It is not unusual to experience pain in the scar from time to time during the interval.  If the pain becomes severe or the scar thickens, you should notify the office.    A slight amount of redness in a scar is expected for the first six months.  After six months, the redness will fade and the scar will soften and fade.  The color difference becomes less noticeable with time.  If there  are any problems, return for a post-op surgery check at your earliest convenience.  To improve the appearance of the scar, you can use silicone scar gel, cream, or sheets (such as Mederma or Serica) every night for up to one year. These are available over the counter (without a prescription).  Please call our office at [phone] for any questions or concerns.       Intralesional steroid injection side effects were reviewed including thinning of the skin and discoloration, such as redness, lightening or darkening.   Due to recent changes in healthcare laws, you may see results of  your pathology and/or laboratory studies on MyChart before the doctors have had a chance to review them. We understand that in some cases there may be results that are confusing or concerning to you. Please understand that not all results are received at the same time and often the doctors may need to interpret multiple results in order to provide you with the best plan of care or course of treatment. Therefore, we ask that you please give [place] 2 business days to thoroughly review all your results before contacting the office for clarification. Should we see a critical lab result, you will be contacted sooner.   If You Need Anything After Your Visit  If you have any questions or concerns for your doctor, please call our main line at [phone] and press option 4 to reach your doctor's medical assistant. If no one answers, please leave a voicemail as directed and we will return your call as soon as possible. Messages left after 4 pm will be answered the following business day.   You may also send [place] a message via MyChart. We typically respond to MyChart messages within 1-2 business days.  For prescription refills, please ask your pharmacy to contact our office. Our fax number is [phone].  If you have an urgent issue when the clinic is closed that cannot wait until the next business day, you can page your doctor at the number below.    Please note that while we do our best to be available for urgent issues outside of office hours, we are not available 24/7.   If you have an urgent issue and are unable to reach [place], you may choose to seek medical care at your doctor's office, retail clinic, urgent care center, or emergency room.  If you have a medical emergency, please immediately call 911 or go to the emergency department.  Pager Numbers  - Dr. Gwen Pounds: [phone]  - Dr. Neale Burly: [phone]  - Dr. Roseanne Reno: [phone]  In the event of inclement weather, please call our main line at  [phone] for an update on the status of any delays or closures.  Dermatology Medication Tips: Please keep the boxes that topical medications come in in order to help keep track of the instructions about where and how to use these. Pharmacies typically print the medication instructions only on the boxes and not directly on the medication tubes.   If your medication is too expensive, please contact our office at [phone] option 4 or send [place] a message through MyChart.   We are unable to tell what your co-pay for medications will be in advance as this is different depending on your insurance coverage. However, we may be able to find a substitute medication at lower cost or fill out paperwork to get insurance to cover a needed medication.   If a prior authorization is required to get your medication covered by your  insurance company, please allow [place] 1-2 business days to complete this process.  Drug prices often vary depending on where the prescription is filled and some pharmacies may offer cheaper prices.  The website www.goodrx.com contains coupons for medications through different pharmacies. The prices here do not account for what the cost may be with help from insurance (it may be cheaper with your insurance), but the website can give you the price if you did not use any insurance.  - You can print the associated coupon and take it with your prescription to the pharmacy.  - You may also stop by our office during regular business hours and pick up a GoodRx coupon card.  - If you need your prescription sent electronically to a different pharmacy, notify our office through [facility] or by phone at [phone] option 4.     Si Usted Necesita Algo Despus de Su Visita  Tambin puede enviarnos un mensaje a travs de Clinical cytogeneticist. Por lo general respondemos a los mensajes de MyChart en el transcurso de 1 a 2 das hbiles.  Para renovar recetas, por favor pida a su farmacia que se  ponga en contacto con nuestra oficina. Annie Sable de fax es [place] [phone].  Si tiene un asunto urgente cuando la clnica est cerrada y que no puede esperar hasta el siguiente da hbil, puede llamar/localizar a su doctor(a) al nmero que aparece a continuacin.   Por favor, tenga en cuenta que aunque hacemos todo lo posible para estar disponibles para asuntos urgentes fuera del horario de [place], no estamos disponibles las 24 horas del da, los 7 [address]  [address] de la [place].   Si tiene un problema urgente y no puede comunicarse con nosotros, puede optar por buscar atencin mdica  en el consultorio de su doctor(a), en una clnica privada, en un centro de atencin urgente o en una sala de emergencias.  Si tiene Engineer, drilling, por favor llame inmediatamente al 911 o vaya a la sala de emergencias.  Nmeros de bper  - Dr. Gwen Pounds: [phone]  - Dra. Moye: [phone]  - Dra. Roseanne Reno: [phone]  En caso de inclemencias del [place], por favor llame a Lacy Duverney principal al [phone] para una actualizacin sobre el [place] de cualquier retraso o cierre.  Consejos para la medicacin en dermatologa: Por favor, guarde las cajas en las que vienen los medicamentos de uso tpico para ayudarle a seguir las instrucciones sobre dnde y cmo usarlos. Las farmacias generalmente imprimen las instrucciones del medicamento slo en las cajas y no directamente en los tubos del Lane.   Si su medicamento es muy caro, por favor, pngase en contacto con Rolm Gala llamando al [phone] y presione la opcin 4 o envenos un mensaje a travs de Clinical cytogeneticist.   No podemos decirle cul ser su copago por los medicamentos por adelantado ya que esto es diferente dependiendo de la cobertura de su seguro. Sin embargo, es posible que podamos encontrar un medicamento sustituto a Audiological scientist un formulario para que el seguro cubra el medicamento que se considera necesario.   Si se requiere  una autorizacin previa para que su compaa de seguros [place] su medicamento, por favor permtanos de 1 a 2 das hbiles para completar [address]  Los precios de los medicamentos varan con frecuencia dependiendo del Environmental consultant de dnde se surte la receta y alguna farmacias pueden ofrecer precios ms baratos.  El sitio web www.goodrx.com tiene cupones para medicamentos de Health and safety inspector. Los precios aqu no tienen en  cuenta lo que podra costar con la ayuda del seguro (puede ser ms barato con su seguro), pero el sitio web puede darle el precio si no Field seismologist.  - Puede imprimir el cupn correspondiente y llevarlo con su receta a la farmacia.  - Tambin puede pasar por nuestra oficina durante el horario de atencin regular y Charity fundraiser una tarjeta de cupones de GoodRx.  - Si necesita que su receta se enve electrnicamente a una farmacia diferente, informe a nuestra oficina a travs de MyChart de [place] o por telfono llamando al [phone] y presione la opcin 4.

## 2022-09-26 ENCOUNTER — Other Ambulatory Visit: Payer: Self-pay | Admitting: Physical Medicine and Rehabilitation

## 2022-09-26 ENCOUNTER — Telehealth: Payer: Self-pay

## 2022-09-26 ENCOUNTER — Other Ambulatory Visit (INDEPENDENT_AMBULATORY_CARE_PROVIDER_SITE_OTHER): Payer: Medicare Other | Admitting: *Deleted

## 2022-09-26 ENCOUNTER — Other Ambulatory Visit (HOSPITAL_COMMUNITY)
Admission: RE | Admit: 2022-09-26 | Discharge: 2022-09-26 | Disposition: A | Discharge status: Home / Self Care | Payer: Medicare Other | Source: Ambulatory Visit | Attending: Obstetrics & Gynecology | Admitting: Obstetrics & Gynecology

## 2022-09-26 DIAGNOSIS — Z7251 High risk heterosexual behavior: Secondary | ICD-10-CM | POA: Diagnosis present

## 2022-09-26 DIAGNOSIS — K824 Cholesterolosis of gallbladder: Secondary | ICD-10-CM | POA: Insufficient documentation

## 2022-09-26 DIAGNOSIS — Z113 Encounter for screening for infections with a predominantly sexual mode of transmission: Secondary | ICD-10-CM

## 2022-09-26 DIAGNOSIS — Z114 Encounter for screening for human immunodeficiency virus [HIV]: Secondary | ICD-10-CM

## 2022-09-26 MED ORDER — ACETAMINOPHEN-CODEINE 300-60 MG PO TABS: 1.0000 | ORAL_TABLET | Freq: Three times a day (TID) | ORAL | 0 refills | Status: DC | PRN

## 2022-09-26 NOTE — Progress Notes (Signed)
   NURSE VISIT- VAGINITIS/STD  SUBJECTIVE:  Elizabeth Bautista is a 47 y.o. [id number] GYN patientfemale here for a vaginal swab for vaginitis screening, STD screen.  She reports the following symptoms: none. Had unprotected sex.  Denies abnormal vaginal bleeding, significant pelvic pain, fever, or UTI symptoms.  OBJECTIVE:  There were no vitals taken for this visit.  Appears well, in no apparent distress  ASSESSMENT: Vaginal swab for vaginitis screening & STD screening.  PLAN: Self-collected vaginal probe for Gonorrhea, Chlamydia, Trichomonas, Bacterial Vaginosis, Yeast sent to lab. Pt also requested HIV and RPR labs.  Treatment: to be determined once results are received Follow-up as needed if symptoms persist/worsen, or new symptoms develop  Malachy Mood  09/26/2022 11:18 AM

## 2022-09-26 NOTE — Telephone Encounter (Signed)
Ok I change it to [employer]

## 2022-09-26 NOTE — Telephone Encounter (Signed)
Tylenol #4 prescription was sent to [facility] and should have been sent to [facility] in [place]. Can you please resend to [facility].

## 2022-09-27 LAB — HIV ANTIBODY (ROUTINE TESTING W REFLEX): HIV Screen 4th Generation wRfx: NONREACTIVE

## 2022-09-27 LAB — RPR: RPR Ser Ql: NONREACTIVE

## 2022-09-29 LAB — CERVICOVAGINAL ANCILLARY ONLY
Bacterial Vaginitis (gardnerella): NEGATIVE
Candida Glabrata: NEGATIVE
Candida Vaginitis: NEGATIVE
Chlamydia: NEGATIVE
Comment: NEGATIVE
Comment: NEGATIVE
Comment: NEGATIVE
Comment: NEGATIVE
Comment: NEGATIVE
Comment: NORMAL
Neisseria Gonorrhea: NEGATIVE
Trichomonas: NEGATIVE

## 2022-09-30 ENCOUNTER — Telehealth: Payer: Self-pay

## 2022-09-30 NOTE — Telephone Encounter (Signed)
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 

## 2022-09-30 NOTE — Telephone Encounter (Signed)
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

## 2022-09-30 NOTE — Telephone Encounter (Signed)
Discussed biopsy results with patient, surgery scheduled

## 2022-09-30 NOTE — Telephone Encounter (Signed)
Left message on voicemail to return my call.  

## 2022-10-01 ENCOUNTER — Telehealth: Payer: Self-pay

## 2022-10-01 NOTE — Telephone Encounter (Signed)
Patient contacted the office and states she went to the [facility]. The patient states she had a mole under her foot that she had biopsied. Patient states she was told it was near cancerous and will have to be removed. Patient states she was told Enbrel can cause cancer. Patient states she is done with Enbrel and will not be taking it anymore. Patient states she would like to find something new. Patient call back number is [phone]. Please advise.

## 2022-10-01 NOTE — Telephone Encounter (Signed)
[employer] previously approved.

## 2022-10-02 ENCOUNTER — Encounter: Payer: Self-pay | Admitting: Dermatology

## 2022-10-02 ENCOUNTER — Other Ambulatory Visit (HOSPITAL_COMMUNITY): Payer: Self-pay

## 2022-10-08 ENCOUNTER — Telehealth: Payer: Self-pay | Admitting: Pharmacy Technician

## 2022-10-08 ENCOUNTER — Other Ambulatory Visit (HOSPITAL_COMMUNITY): Payer: Self-pay

## 2022-10-08 ENCOUNTER — Encounter: Payer: Self-pay | Admitting: Neurology

## 2022-10-08 NOTE — Telephone Encounter (Signed)
Patient Advocate Encounter   Received notification that prior authorization for Ubrelvy 100MG  tablets is required.   PA submitted on 10/08/2022 Key [id number] [employer] [facility] Medicare Electronic Prior Authorization Request Form Status is pending

## 2022-10-08 NOTE — Telephone Encounter (Signed)
Status of PA in separate encounter 

## 2022-10-14 ENCOUNTER — Ambulatory Visit (INDEPENDENT_AMBULATORY_CARE_PROVIDER_SITE_OTHER): Payer: Medicare Other | Admitting: Dermatology

## 2022-10-14 ENCOUNTER — Other Ambulatory Visit (HOSPITAL_COMMUNITY): Payer: Self-pay

## 2022-10-14 ENCOUNTER — Encounter: Payer: Self-pay | Admitting: Dermatology

## 2022-10-14 VITALS — BP 125/82

## 2022-10-14 DIAGNOSIS — Q825 Congenital non-neoplastic nevus: Secondary | ICD-10-CM

## 2022-10-14 DIAGNOSIS — D485 Neoplasm of uncertain behavior of skin: Secondary | ICD-10-CM

## 2022-10-14 DIAGNOSIS — D2371 Other benign neoplasm of skin of right lower limb, including hip: Secondary | ICD-10-CM

## 2022-10-14 DIAGNOSIS — D239 Other benign neoplasm of skin, unspecified: Secondary | ICD-10-CM

## 2022-10-14 DIAGNOSIS — D492 Neoplasm of unspecified behavior of bone, soft tissue, and skin: Secondary | ICD-10-CM

## 2022-10-14 MED ORDER — MUPIROCIN 2 % EX OINT
1.0000 | TOPICAL_OINTMENT | Freq: Every day | CUTANEOUS | 1 refills | Status: AC
Start: 2022-10-14 — End: ?

## 2022-10-14 NOTE — Progress Notes (Signed)
Follow-Up Visit   Subjective  Elizabeth Bautista is a 47 y.o. female who presents for the following: Moderate to Severe Dysplastic nevus, bx proven (R ant sole, pt presents for excision).  The following portions of the chart were reviewed this encounter and updated as appropriate:   Tobacco  Allergies  Meds  Problems  Med Hx  Surg Hx  Fam Hx      Review of Systems:  No other skin or systemic complaints except as noted in HPI or Assessment and Plan.  Objective  Well appearing patient in no apparent distress; mood and affect are within normal limits.  A focused examination was performed including right foot. Relevant physical exam findings are noted in the Assessment and Plan.  R ant sole Pink bx site 1.3 x 0.8cm     L ant sole 0.6cm dark brown macule  Right Flank Brown pap    Assessment & Plan  Dysplastic nevus R ant sole  Moderate to Severe, bx proven Excised today Start Mupirocin oint qd to excision site  Skin excision - R ant sole  Lesion length (cm):  1.3 Lesion width (cm):  0.8 Margin per side (cm):  0.2 Total excision diameter (cm):  1.7 Informed consent: discussed and consent obtained   Timeout: patient name, date of birth, surgical site, and procedure verified   Procedure prep:  Patient was prepped and draped in usual sterile fashion Prep type:  Isopropyl alcohol and povidone-iodine Anesthesia: the lesion was anesthetized in a standard fashion   Anesthetic:  1% lidocaine w/ epinephrine 1-100,000 buffered w/ 8.4% NaHCO3 (3cc lido w/ epi, 3cc bupivicaine, Total of 6cc) Instrument used: #15 blade   Instrument used comment:  #15c blade Hemostasis achieved with: pressure   Hemostasis achieved with comment:  Electrocautery Outcome: patient tolerated procedure well with no complications   Post-procedure details: sterile dressing applied and wound care instructions given   Dressing type: bandage, pressure dressing and bacitracin (Mupirocin)    Skin  repair - R ant sole Complexity:  Intermediate Final length (cm):  3 Informed consent: discussed and consent obtained   Timeout: patient name, date of birth, surgical site, and procedure verified   Procedure prep:  Patient was prepped and draped in usual sterile fashion Prep type:  Povidone-iodine Anesthesia: the lesion was anesthetized in a standard fashion   Anesthetic:  1% lidocaine w/ epinephrine 1-100,000 buffered w/ 8.4% NaHCO3 Reason for type of repair: reduce tension to allow closure, reduce the risk of dehiscence, infection, and necrosis, reduce subcutaneous dead space and avoid a hematoma, allow closure of the large defect, preserve normal anatomy, preserve normal anatomical and functional relationships and enhance both functionality and cosmetic results   Undermining: edges undermined   Subcutaneous layers (deep stitches):  Subcutaneous suture technique: Inverted Dermal. Fine/surface layer approximation (top stitches):  Suture size:  2-0 Suture type: nylon   Suture type comment:  Nylon Stitches: horizontal mattress   Hemostasis achieved with: pressure Hemostasis achieved with comment:  Electrocautery Outcome: patient tolerated procedure well with no complications   Post-procedure details: sterile dressing applied and wound care instructions given   Dressing type: bandage, pressure dressing and bacitracin (Mupirocin)    mupirocin ointment (BACTROBAN) 2 % - R ant sole Apply 1 Application topically daily. Qd to excision site  Specimen 1 - Surgical pathology Differential Diagnosis: D48.5 Bx proven Moderate to Severe Dysplastic Nevus Tagged at heel edge; two pieces Check Margins: yes Healing bx site 1.3 x 0.8cm [id number]  Congenital non-neoplastic nevus  L ant sole  Recommend bx in the future (will let R foot heal from todays surgery)  Neoplasm of skin Right Flank  Nevus vs SK, plan shave removal/bx on f/u   Return for surgery f/u, may not remove stitches, let Dr. Kirtland Bouchard  see first, and bx on R flank.  I, Ardis Rowan, RMA, am acting as scribe for Armida Sans, MD . Documentation: I have reviewed the above documentation for accuracy and completeness, and I agree with the above.  Armida Sans, MD

## 2022-10-14 NOTE — Telephone Encounter (Signed)
Attempted to return call today unable to leave voicemail box is full. Precancerous mole on her foot very unlikely to be medication related since she was on treatment for less than 1 year. It is good that she was able to see dermatology to have this removed. However agree it is reasonable to stop and consider alternative treatment options if she now has a history of skin cancer or near skin cancer.  We have a follow-up scheduled in June would be a good opportunity to take a look at symptoms off the Enbrel and discuss from among alternative options.

## 2022-10-14 NOTE — Telephone Encounter (Signed)
I called patient, patient verbalized understanding, by stating "I've been off that s. . . ", patient hung up on me.

## 2022-10-14 NOTE — Patient Instructions (Signed)
Wound Care Instructions  On the day following your surgery, you should begin doing daily dressing changes: Remove the old dressing and discard it. Cleanse the wound gently with tap water. This may be done in the shower or by placing a wet gauze pad directly on the wound and letting it soak for several minutes. It is important to gently remove any dried blood from the wound in order to encourage healing. This may be done by gently rolling a moistened Q-tip on the dried blood. Do not pick at the wound. If the wound should start to bleed, continue cleaning the wound, then place a moist gauze pad on the wound and hold pressure for a few minutes.  Make sure you then dry the skin surrounding the wound completely or the tape will not stick to the skin. Do not use cotton balls on the wound. After the wound is clean and dry, apply the ointment gently with a Q-tip. Cut a non-stick pad to fit the size of the wound. Lay the pad flush to the wound. If the wound is draining, you may want to reinforce it with a small amount of gauze on top of the non-stick pad for a little added compression to the area. Use the tape to seal the area completely. Select from the following with respect to your individual situation: If your wound has been stitched closed: continue the above steps 1-8 at least daily until your sutures are removed. If your wound has been left open to heal: continue steps 1-8 at least daily for the first 3-4 weeks. We would like for you to take a few extra precautions for at least the next week. Sleep with your head elevated on pillows if our wound is on your head. Do not bend over or lift heavy items to reduce the chance of elevated blood pressure to the wound Do not participate in particularly strenuous activities.   Below is a list of dressing supplies you might need.  Cotton-tipped applicators - Q-tips Gauze pads (2x2 and/or 4x4) - All-Purpose Sponges Non-stick dressing material - Telfa Tape -  Paper or Hypafix New and clean tube of petroleum jelly - Vaseline    Comments on Post-Operative Period Slight swelling and redness often appear around the wound. This is normal and will disappear within several days following the surgery. The healing wound will drain a brownish-red-yellow discharge during healing. This is a normal phase of wound healing. As the wound begins to heal, the drainage may increase in amount. Again, this drainage is normal. Notify us if the drainage becomes persistently bloody, excessively swollen, or intensely painful or develops a foul odor or red streaks.  If you should experience mild discomfort during the healing phase, you may take an aspirin-free medication such as Tylenol (acetaminophen). Notify us if the discomfort is severe or persistent. Avoid alcoholic beverages when taking pain medicine.  In Case of Wound Hemorrhage A wound hemorrhage is when the bandage suddenly becomes soaked with bright red blood and flows profusely. If this happens, sit down or lie down with your head elevated. If the wound has a dressing on it, do not remove the dressing. Apply pressure to the existing gauze. If the wound is not covered, use a gauze pad to apply pressure and continue applying the pressure for 20 minutes without peeking. DO NOT COVER THE WOUND WITH A LARGE TOWEL OR WASH CLOTH. Release your hand from the wound site but do not remove the dressing. If the bleeding has stopped,   gently clean around the wound. Leave the dressing in place for 24 hours if possible. This wait time allows the blood vessels to close off so that you do not spark a new round of bleeding by disrupting the newly clotted blood vessels with an immediate dressing change. If the bleeding does not subside, continue to hold pressure. If matters are out of your control, contact an After Hours clinic or go to the Emergency Room.   

## 2022-10-14 NOTE — Telephone Encounter (Signed)
Patient Advocate Encounter  Prior Authorization for UBRELVY 100MG  has been approved with [facility].    PA# [id number] Effective dates: 4.24.24 through 12.31.2099  Per [facility] test claim, UNABLE TO PROVIDE. ALREADY FILLED ON 5.8.24

## 2022-10-15 ENCOUNTER — Telehealth: Payer: Self-pay | Admitting: Pulmonary Disease

## 2022-10-15 ENCOUNTER — Telehealth: Payer: Self-pay

## 2022-10-15 ENCOUNTER — Other Ambulatory Visit: Payer: Self-pay | Admitting: Pulmonary Disease

## 2022-10-15 DIAGNOSIS — J4540 Moderate persistent asthma, uncomplicated: Secondary | ICD-10-CM

## 2022-10-15 MED ORDER — HYDROCODONE-ACETAMINOPHEN 5-325 MG PO TABS: 1.0000 | ORAL_TABLET | ORAL | 0 refills | Status: DC

## 2022-10-15 NOTE — Telephone Encounter (Signed)
Advised pt that I had a printed prescription for Vicodin 5-325 1-2 po q 4-6 hours prn pain, avoid driving or operating heavy machinery while taking, may make drowsy #10 0 rf.  Patient does not have a way to get to the office and asked could we fax to pharmacy.  I advised pharmacy will only take a hard copy or escribe, and that Dr. Philemon Kingdom escribe for narcotics is not working.  Patient then asked could I call her PCP and get them to send the Vicodin in.  I advised I could not do that but she could call them and see if they could send in.  Patient then hung up on me./sh

## 2022-10-15 NOTE — Telephone Encounter (Signed)
Pt spoke w [employer] and she was told that they are not able to fill the nebulizer request due to her Primary ins being Medicare. The order has to be sent over to a DME company who accepts Medicare.

## 2022-10-15 NOTE — Telephone Encounter (Signed)
Called and spoke with patient. She stated that her nebulizer machine has not been working well for the past few days. She has not been able to get any of the medication out of the cup and the machine itself will not stay on. She is not sure of the company she received the machine from but she did state she uses [place] for her cpap machine.   I advised her that I would place an order for a nebulizer machine for her and have it sent to [facility]. She verbalized understanding.   Nothing further needed at time of call.

## 2022-10-15 NOTE — Telephone Encounter (Signed)
Spoke to patient and she said she was in a lot of pain and could not sleep last night.  She tried the Tylenol/Ibuprofen and it didn't help at all.  She would like something sent into [facility] for pain./sh

## 2022-10-15 NOTE — Telephone Encounter (Signed)
Wants to get a new nebulizer. The one she has is cutting off and not pushing out the medication. Pt has been struggling to breathe for the past few days.

## 2022-10-15 NOTE — Telephone Encounter (Signed)
PCC's are you able to send order to DME company covered through patients insurance

## 2022-10-15 NOTE — Telephone Encounter (Signed)
Called pt and advised we were able to get the Vicodin escribed to her pharmacy.Marguerite Olea

## 2022-10-20 ENCOUNTER — Encounter: Payer: Self-pay | Admitting: Urology

## 2022-10-20 ENCOUNTER — Other Ambulatory Visit (HOSPITAL_COMMUNITY): Payer: Self-pay

## 2022-10-20 ENCOUNTER — Ambulatory Visit (INDEPENDENT_AMBULATORY_CARE_PROVIDER_SITE_OTHER): Payer: Medicare Other | Admitting: Urology

## 2022-10-20 VITALS — BP 122/78 | HR 71 | Ht 65.0 in | Wt 151.0 lb

## 2022-10-20 DIAGNOSIS — R351 Nocturia: Secondary | ICD-10-CM

## 2022-10-20 DIAGNOSIS — N3946 Mixed incontinence: Secondary | ICD-10-CM | POA: Diagnosis not present

## 2022-10-20 LAB — URINALYSIS, COMPLETE
Bilirubin, UA: NEGATIVE
Glucose, UA: NEGATIVE
Ketones, UA: NEGATIVE
Leukocytes,UA: NEGATIVE
Nitrite, UA: NEGATIVE
RBC, UA: NEGATIVE
Specific Gravity, UA: >1.03 — ABNORMAL HIGH (ref 1.005–1.030)
Urobilinogen, Ur: 1 mg/dL (ref 0.2–1.0)
pH, UA: 5.5 (ref 5.0–7.5)

## 2022-10-20 LAB — MICROSCOPIC EXAMINATION: Epithelial Cells (non renal): >10 /hpf — AB (ref 0–10)

## 2022-10-20 NOTE — Progress Notes (Signed)
10/20/2022 8:13 AM   Elizabeth Bautista 1975/10/01 [id number]  Referring provider: Adline Potter, NP [address] [place],  [place] [address]  No chief complaint on file.   HPI: Was consulted to assess the patient is urinary incontinence and abdominal pain.  She leaks with coughing sneezing sometimes bending lifting.  She has urge incontinence.  She has mild bedwetting.  She wears 3-4 larger pads a day that are moderately wet.  She leaks with laughing  She can void 3 times in 30 minutes and cannot hold it for 2 hours.  She voids frequently due to pressure that is less than when she voids.  She is getting up 4-5 times at night.  Flow is good and she sometimes feels empty  Since her gallbladder was removed she has diarrhea and almost daily Kuneff fecal incontinence also wearing the pads.  She has not had a hysterectomy.  The patient reports the lower abdominal pain for years since her children but getting worse over time.  It appears she has a diagnosis of fibromyalgia and lumbar spinal stenosis.  She has been prescribed medicine for neuropathic pain control.  She has a history of rheumatoid arthritis.  She has been in many times by multiple providers.  Patient had a CT scan of the abdomen pelvis in October 2023 and has had previous gastric bypass but CT scan within normal limits  No history of kidney stones bladder surgery or bladder infections.  No treatment.  No neurologic issues.     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 Tolerated IV venofer   Phenytoin Anaphylaxis   Other reaction(s): swelling and heart rate decreased   Pecan Nut (diagnostic) Other (See Comments)   Phenylbutazones Other (See Comments)        Medication List        Accurate as of Oct 20, 2022  8:13 AM. If you have any questions, ask your nurse or doctor.          acetaminophen-codeine 300-60 MG tablet Commonly known as: TYLENOL #4 Take 1 tablet by mouth every 8 (eight) hours as needed for moderate pain.   Aimovig 140 MG/ML Soaj Generic drug: Erenumab-aooe Inject 140 mg into the skin every 30 (thirty) days.   albuterol (2.5 MG/3ML) 0.083% nebulizer solution Commonly known as: PROVENTIL  Take 3 mLs (2.5 mg total) by nebulization every 6 (six) hours as needed for wheezing or shortness of breath.   albuterol 108 (90 Base) MCG/ACT inhaler Commonly known as: ProAir HFA Inhale 2 puffs into the lungs every 6 (six) hours as needed for wheezing or shortness of breath. 2 puffs every 4 hours as needed only  if your can't catch your breath   amLODipine 5 MG tablet Commonly known as: NORVASC Take by mouth.   baclofen 10 MG tablet Commonly known as: LIORESAL TAKE 1 TABLET BY MOUTH AT BEDTIME AS NEEDED FOR MUSCLE SPASMS   blood glucose meter kit and supplies Dispense based on patient and insurance preference. Use up to four times daily as directed. (FOR ICD-10 E10.9, E11.9).   budesonide 0.5 MG/2ML nebulizer solution Commonly known as: PULMICORT 2 mL every 12 (twelve) hours.    busPIRone 7.5 MG tablet Commonly known as: BUSPAR Take 7.5 mg by mouth daily.   cetirizine 10 MG tablet Commonly known as: ZYRTEC TAKE 1 TABLET BY MOUTH EVERY DAY   colestipol 1 g tablet Commonly known as: COLESTID Take 2 g by mouth daily.   doxycycline 100 MG capsule Commonly known as: MONODOX Take 100 mg by mouth 2 (two) times daily.   DULoxetine 60 MG capsule Commonly known as: CYMBALTA Take by mouth.   Enbrel Mini 50 MG/ML Soct Generic drug: Etanercept Inject 50 mg into the skin once a week.   EPINEPHrine 0.3 mg/0.3 mL Soaj injection Commonly known as: EPI-PEN Inject 0.3 mg into the muscle as needed for anaphylaxis.   eszopiclone 1 MG Tabs tablet Commonly known as: Lunesta Take 1 tablet (1 mg total) by mouth at bedtime as needed for sleep. Take immediately before bedtime   famotidine 20 MG tablet Commonly known as: PEPCID Take 1 tablet (20 mg total) by mouth daily.   ferrous sulfate 325 (65 FE) MG tablet TAKE 1 TABLET BY MOUTH TWICE A DAY   Flowflex COVID-19 Ag Home Test Kit Generic drug: COVID-19 At Home Antigen Test See admin instructions.   furosemide 20 MG tablet Commonly known as: LASIX Take 20 mg by mouth daily.   hydrochlorothiazide 25 MG tablet Commonly known as: HYDRODIURIL Take by mouth.   HYDROcodone-acetaminophen 5-325 MG tablet Commonly known as: NORCO/VICODIN Take 1-2 tablets by mouth as directed. 1-2 po q 4-6 hours prn pain, avoid driving, operating machinery while taking, may make drowsy   ipratropium-albuterol 0.5-2.5 (3) MG/3ML Soln Commonly known as: DUONEB Take 3 mLs by nebulization every 6 (six) hours as needed.   meloxicam 7.5 MG tablet Commonly known as: MOBIC   metoCLOPramide 10 MG tablet Commonly known as: REGLAN Take by mouth.   metoprolol tartrate 50 MG tablet Commonly known as: LOPRESSOR Take by mouth.   mirtazapine 30 MG tablet Commonly known as: REMERON Take by mouth.   montelukast 10 MG tablet Commonly  known as: SINGULAIR TAKE 1 TABLET BY MOUTH EVERYDAY AT BEDTIME   multivitamin tablet Take 1 tablet by mouth daily.   mupirocin ointment 2 % Commonly known as: BACTROBAN Apply 1 Application topically daily. Qd to excision site   Nucala 100 MG/ML Soaj Generic drug: Mepolizumab Inject 100 mg into the skin every 28 (twenty-eight) days.   omeprazole 40 MG capsule Commonly known as: PRILOSEC Take by mouth.   ondansetron 8 MG disintegrating tablet Commonly known as: ZOFRAN-ODT Take by mouth.   pantoprazole 40 MG tablet Commonly known as: PROTONIX TAKE 1 TABLET (40 MG TOTAL) BY MOUTH DAILY. TAKE 30-60  MIN BEFORE FIRST MEAL OF THE DAY   potassium chloride SA 20 MEQ tablet Commonly known as: KLOR-CON M Take 1 tablet (20 mEq total) by mouth 2 (two) times daily.   pregabalin 200 MG capsule Commonly known as: LYRICA Take 1 capsule (200 mg total) by mouth 2 (two) times daily.   QUEtiapine 100 MG tablet Commonly known as: SEROQUEL   rizatriptan 10 MG disintegrating tablet Commonly known as: MAXALT-MLT Take 1 tablet earliest onset of migraine.  May repeat in 2 hours if needed. Maximum 2 tablets in 24 hours   Savella 12.5 MG Tabs Generic drug: Milnacipran HCl Take 1 tablet (12.5 mg total) by mouth at bedtime.   topiramate 25 MG tablet Commonly known as: Topamax Take 2 tablets (50 mg total) by mouth at bedtime.   Trelegy Ellipta 200-62.5-25 MCG/ACT Aepb Generic drug: Fluticasone-Umeclidin-Vilant Inhale 1 puff into the lungs daily.   tretinoin 0.025 % cream Commonly known as: RETIN-A Apply pea sized amount to face qhs for acne   triamcinolone 0.025 % ointment Commonly known as: KENALOG Apply 1 Application topically 2 (two) times daily.   Ubrelvy 100 MG Tabs Generic drug: Ubrogepant Take 1 tablet (100 mg total) by mouth as needed. May repeat after 2 hours.  Maximum 2 tablets in 24 hours.   vitamin B-1 250 MG tablet Take by mouth.   vitamin C 1000 MG tablet Take 1,000  mg by mouth daily.   Vitamin D (Ergocalciferol) 1.25 MG (50000 UNIT) Caps capsule Commonly known as: DRISDOL   Yupelri 175 MCG/3ML nebulizer solution Generic drug: revefenacin Inhale into the lungs.   zonisamide 100 MG capsule Commonly known as: ZONEGRAN Take by mouth.        Allergies:  Allergies  Allergen Reactions   Ferumoxytol Anaphylaxis    ~5 years ago. Tolerated IV venofer   Phenytoin Anaphylaxis    Other reaction(s): swelling and heart rate decreased   Pecan Nut (Diagnostic) Other (See Comments)   Phenylbutazones Other (See Comments)    Family History: Family History  Problem Relation Age of Onset   High blood pressure Mother    Rheum arthritis Mother    Diabetes Father    Diabetes Sister    Heart Problems Sister    Diabetes Brother    Diabetes Paternal Grandmother    Diabetes Other        father's side "everybody died from Diabetes"   High blood pressure Other        mother's side, multiple siblings with this    Heart attack Other        family member on mother's side    Diabetes Paternal Aunt    Seizures Cousin        not sibings to the other cousins with seizures   Breast cancer Cousin    Seizures Cousin        not sibings to the other cousins with seizures   Seizures Cousin        not sibings to the other cousins with seizures   Cervical cancer Maternal Aunt    Breast cancer Cousin    Dementia Maternal Aunt    Asthma Maternal Aunt    Heart Problems Maternal Grandmother    Diabetes Brother    Asthma Daughter    Angioedema Daughter    Asthma Son     Social History:  reports that she quit smoking about 9 years ago. Her smoking use included cigarettes. She has a 0.50 pack-year smoking history. She has never  been exposed to tobacco smoke. She has never used smokeless tobacco. She reports that she does not currently use alcohol. She reports that she does not currently use drugs.  ROS:                                         Physical Exam: There were no vitals taken for this visit.  Constitutional:  Alert and oriented, No acute distress. HEENT: [place] AT, moist mucus membranes.  Trachea midline, no masses. Cardiovascular: No clubbing, cyanosis, or edema. Respiratory: Normal respiratory effort, no increased work of breathing. GI: Abdomen is soft, nontender, nondistended, no abdominal masses GU: Mild grade 2 hypermobility bladder neck and negative cough test with moderate cough and no prolapse Skin: No rashes, bruises or suspicious lesions. Lymph: No cervical or inguinal adenopathy. Neurologic: Grossly intact, no focal deficits, moving all 4 extremities. Psychiatric: Normal mood and affect.  Laboratory Data: Lab Results  Component Value Date   WBC 4.6 08/05/2022   HGB 11.6 (L) 08/05/2022   HCT 36.7 08/05/2022   MCV 85.7 08/05/2022   PLT 206 08/05/2022    Lab Results  Component Value Date   CREATININE 0.52 08/05/2022    No results found for: "PSA"  No results found for: "TESTOSTERONE"  Lab Results  Component Value Date   HGBA1C 5.8 (H) 07/17/2021    Urinalysis    Component Value Date/Time   COLORURINE AMBER (A) 06/09/2022 1528   APPEARANCEUR HAZY (A) 06/09/2022 1528   LABSPEC 1.030 06/09/2022 1528   PHURINE 5.0 06/09/2022 1528   GLUCOSEU NEGATIVE 06/09/2022 1528   HGBUR NEGATIVE 06/09/2022 1528   BILIRUBINUR NEGATIVE 06/09/2022 1528   KETONESUR 20 (A) 06/09/2022 1528   PROTEINUR 30 (A) 06/09/2022 1528   NITRITE NEGATIVE 06/09/2022 1528   LEUKOCYTESUR NEGATIVE 06/09/2022 1528    Pertinent Imaging: Urine reviewed.  Urine sent for culture.  Chart reviewed  Assessment & Plan: The patient has mixed incontinence and nocturia.  She has significant frequency and nocturia.  She has multiple medical issues including fibromyalgia.  She may or may not have interstitial cystitis.  Role of urodynamics and cystoscopy discussed.  He does have fecal incontinence as well.  She lives in [place] and  may need a hydrodistention in the future.  We will look for evidence of interstitial cystitis during the filling phase of urodynamics.  There are no diagnoses linked to this encounter.  No follow-ups on file.  Martina Sinner, MD  [facility] [address], [address] [place], [place] [address] [phone]

## 2022-10-21 ENCOUNTER — Ambulatory Visit (INDEPENDENT_AMBULATORY_CARE_PROVIDER_SITE_OTHER): Payer: Medicare Other | Admitting: Dermatology

## 2022-10-21 ENCOUNTER — Encounter: Payer: Self-pay | Admitting: Dermatology

## 2022-10-21 ENCOUNTER — Telehealth: Payer: Self-pay

## 2022-10-21 DIAGNOSIS — Z86018 Personal history of other benign neoplasm: Secondary | ICD-10-CM

## 2022-10-21 NOTE — Patient Instructions (Signed)
Due to recent changes in healthcare laws, you may see results of your pathology and/or laboratory studies on MyChart before the doctors have had a chance to review them. We understand that in some cases there may be results that are confusing or concerning to you. Please understand that not all results are received at the same time and often the doctors may need to interpret multiple results in order to provide you with the best plan of care or course of treatment. Therefore, we ask that you please give us 2 business days to thoroughly review all your results before contacting the office for clarification. Should we see a critical lab result, you will be contacted sooner.   If You Need Anything After Your Visit  If you have any questions or concerns for your doctor, please call our main line at [phone] and press option 4 to reach your doctor's medical assistant. If no one answers, please leave a voicemail as directed and we will return your call as soon as possible. Messages left after 4 pm will be answered the following business day.   You may also send us a message via MyChart. We typically respond to MyChart messages within 1-2 business days.  For prescription refills, please ask your pharmacy to contact our office. Our fax number is [phone].  If you have an urgent issue when the clinic is closed that cannot wait until the next business day, you can page your doctor at the number below.    Please note that while we do our best to be available for urgent issues outside of office hours, we are not available 24/7.   If you have an urgent issue and are unable to reach us, you may choose to seek medical care at your doctor's office, retail clinic, urgent care center, or emergency room.  If you have a medical emergency, please immediately call 911 or go to the emergency department.  Pager Numbers  - Dr. Kowalski: [phone]  - Dr. Moye: [phone]  - Dr. Stewart:  [phone]  In the event of inclement weather, please call our main line at [phone] for an update on the status of any delays or closures.  Dermatology Medication Tips: Please keep the boxes that topical medications come in in order to help keep track of the instructions about where and how to use these. Pharmacies typically print the medication instructions only on the boxes and not directly on the medication tubes.   If your medication is too expensive, please contact our office at [phone] option 4 or send us a message through MyChart.   We are unable to tell what your co-pay for medications will be in advance as this is different depending on your insurance coverage. However, we may be able to find a substitute medication at lower cost or fill out paperwork to get insurance to cover a needed medication.   If a prior authorization is required to get your medication covered by your insurance company, please allow us 1-2 business days to complete this process.  Drug prices often vary depending on where the prescription is filled and some pharmacies may offer cheaper prices.  The website www.goodrx.com contains coupons for medications through different pharmacies. The prices here do not account for what the cost may be with help from insurance (it may be cheaper with your insurance), but the website can give you the price if you did not use any insurance.  - You can print the associated coupon and take it with   your prescription to the pharmacy.  - You may also stop by our office during regular business hours and pick up a GoodRx coupon card.  - If you need your prescription sent electronically to a different pharmacy, notify our office through [facility] MyChart or by phone at [phone] option 4.     Si Usted Necesita Algo Despus de Su Visita  Tambin puede enviarnos un mensaje a travs de MyChart. Por lo general respondemos a los mensajes de MyChart en el transcurso de 1 a 2  das hbiles.  Para renovar recetas, por favor pida a su farmacia que se ponga en contacto con nuestra oficina. Nuestro nmero de fax es el [phone].  Si tiene un asunto urgente cuando la clnica est cerrada y que no puede esperar hasta el siguiente da hbil, puede llamar/localizar a su doctor(a) al nmero que aparece a continuacin.   Por favor, tenga en cuenta que aunque hacemos todo lo posible para estar disponibles para asuntos urgentes fuera del horario de oficina, no estamos disponibles las 24 horas del da, los 7 das de la semana.   Si tiene un problema urgente y no puede comunicarse con nosotros, puede optar por buscar atencin mdica  en el consultorio de su doctor(a), en una clnica privada, en un centro de atencin urgente o en una sala de emergencias.  Si tiene una emergencia mdica, por favor llame inmediatamente al 911 o vaya a la sala de emergencias.  Nmeros de bper  - Dr. Kowalski: [phone]  - Dra. Moye: [phone]  - Dra. Stewart: [phone]  En caso de inclemencias del tiempo, por favor llame a nuestra lnea principal al [phone] para una actualizacin sobre el estado de cualquier retraso o cierre.  Consejos para la medicacin en dermatologa: Por favor, guarde las cajas en las que vienen los medicamentos de uso tpico para ayudarle a seguir las instrucciones sobre dnde y cmo usarlos. Las farmacias generalmente imprimen las instrucciones del medicamento slo en las cajas y no directamente en los tubos del medicamento.   Si su medicamento es muy caro, por favor, pngase en contacto con nuestra oficina llamando al [phone] y presione la opcin 4 o envenos un mensaje a travs de MyChart.   No podemos decirle cul ser su copago por los medicamentos por adelantado ya que esto es diferente dependiendo de la cobertura de su seguro. Sin embargo, es posible que podamos encontrar un medicamento sustituto a menor costo o llenar un formulario para que el  seguro cubra el medicamento que se considera necesario.   Si se requiere una autorizacin previa para que su compaa de seguros cubra su medicamento, por favor permtanos de 1 a 2 das hbiles para completar este proceso.  Los precios de los medicamentos varan con frecuencia dependiendo del lugar de dnde se surte la receta y alguna farmacias pueden ofrecer precios ms baratos.  El sitio web www.goodrx.com tiene cupones para medicamentos de diferentes farmacias. Los precios aqu no tienen en cuenta lo que podra costar con la ayuda del seguro (puede ser ms barato con su seguro), pero el sitio web puede darle el precio si no utiliz ningn seguro.  - Puede imprimir el cupn correspondiente y llevarlo con su receta a la farmacia.  - Tambin puede pasar por nuestra oficina durante el horario de atencin regular y recoger una tarjeta de cupones de GoodRx.  - Si necesita que su receta se enve electrnicamente a una farmacia diferente, informe a nuestra oficina a travs de MyChart de [place]   o por telfono llamando al [phone] y presione la opcin 4.  

## 2022-10-21 NOTE — Progress Notes (Signed)
   Follow-Up Visit   Subjective  Elizabeth Bautista is a 47 y.o. female who presents for the following: Wound recheck at excision site of the R ant sole. Pt states that lesion on the R side is no longer there.   The following portions of the chart were reviewed this encounter and updated as appropriate: medications, allergies, medical history  Review of Systems:  No other skin or systemic complaints except as noted in HPI or Assessment and Plan.  Objective  Well appearing patient in no apparent distress; mood and affect are within normal limits.  A focused examination was performed of the following areas: the R foot and R flank.   Relevant exam findings are noted in the Assessment and Plan.   Assessment & Plan   HISTORY OF DYSPLASTIC NEVUS - R ant sole   Exam - Healing excision site.   Plan to remove sutures at post op appointment next week.  Continue wound care daily.  Recommend regular full body skin exams. Recommend daily broad spectrum sunscreen SPF 30+ to sun-exposed.areas, reapply every 2 hours as needed.  Call if any new or changing lesions are noted between office visits.  Return in about 1 week (around 10/28/2022) for suture removal on nurse schedule.  Maylene Roes, CMA, am acting as scribe for Armida Sans, MD .  Documentation: I have reviewed the above documentation for accuracy and completeness, and I agree with the above.  Armida Sans, MD

## 2022-10-21 NOTE — Telephone Encounter (Signed)
Pt calls triage line and states that she has seen the results of her urinalysis online and questions what the results mean.   Advised pt that her UA is suspicious for infection which is why there are some abnormal values. Advised pt that urine culture is the definitive way to determine infection and we will contact her when then results become available. Pt voiced understanding.

## 2022-10-22 ENCOUNTER — Telehealth: Payer: Self-pay

## 2022-10-22 NOTE — Telephone Encounter (Signed)
Patient called in stating she had surgery last Tuesday, she had to r/s her appointment and could not get back in until July, she states on the day of the surgery they gave her Hydrocodone-5-325 10 tablets, and she is also asking if she can get something else besides the tylenol #4 because it is working, she is requesting a call, and also due to her not being able to get in until July she is saying she cannot go that long without medicine   Per pmp   Filled  Written  ID  Drug  QTY  Days  Prescriber  RX #  Dispenser  Refill  Daily Dose*  Pymt Type  PMP  10/15/2022 10/15/2022 1  Hydrocodone-Acetamin 5-325 Mg 10.00 2 Da Mathews Robinsons [id number] Nor ([id number]) 0/0 25.00 MME Medicare [employer] 09/30/2022 09/26/2022 1  Acetaminophen-Cod #4 Tablet 90.00 30 Kr Rau [id number] [facility] (0019) 0/0 27.00 MME Medicare [place]

## 2022-10-23 ENCOUNTER — Encounter: Payer: Medicare Other | Attending: Physical Medicine & Rehabilitation | Admitting: Physical Medicine and Rehabilitation

## 2022-10-23 DIAGNOSIS — G894 Chronic pain syndrome: Secondary | ICD-10-CM | POA: Insufficient documentation

## 2022-10-23 LAB — CULTURE, URINE COMPREHENSIVE

## 2022-10-23 MED ORDER — HYDROCODONE-ACETAMINOPHEN 5-325 MG PO TABS: 1.0000 | ORAL_TABLET | Freq: Two times a day (BID) | ORAL | 0 refills | Status: DC | PRN
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
## 2022-10-24 ENCOUNTER — Encounter: Payer: Self-pay | Admitting: Physical Medicine and Rehabilitation

## 2022-10-25 LAB — CULTURE, URINE COMPREHENSIVE

## 2022-10-28 ENCOUNTER — Encounter: Payer: Medicare Other | Admitting: Physical Medicine and Rehabilitation

## 2022-10-28 ENCOUNTER — Ambulatory Visit (INDEPENDENT_AMBULATORY_CARE_PROVIDER_SITE_OTHER): Payer: Medicare Other

## 2022-10-28 NOTE — Progress Notes (Signed)
Encounter for Removal of Sutures - Incision site at the right ant sole is clean, dry and intact - Wound cleansed, sutures removed, wound cleansed and steri strips applied.  - Patient advised to keep steri-strips dry until they fall off. - Scars remodel for a full year. - Once steri-strips fall off, patient can apply over-the-counter silicone scar cream each night to help with scar remodeling if desired. - Patient advised to call with any concerns or if they notice any new or changing lesions.  Dorathy Daft, RMA

## 2022-10-28 NOTE — Progress Notes (Unsigned)
NEUROLOGY FOLLOW UP OFFICE NOTE  Elizabeth Bautista [id number]  Assessment/Plan:   Migraine without aura, without status migrainosus, not intractable    Migraine prevention:  Aimovig 140mg  every 4 weeks Migraine rescue:  Ubrelvy 100mg .   Limit use of pain relievers to no more than 2 days out of week to prevent risk of rebound or medication-overuse headache. Keep headache diary Follow up in 9 months       Subjective:  Elizabeth Bautista is a 47 year old right-handed female with asthma. HTN, DM II, RA, iron-deficiency anemia, sleep apnea and pseudoseizures who follows up for migraines.  UPDATE: Started Aimovig Improved. Duration:  15 minutes with Bernita Raisin Frequency:  4-5 days a month  Current NSAIDS/analgesics:  hydrocodone (chronic pain), Mobic (chronic pain) Current triptans:  none Current ergotamine:  none Current anti-emetic:  Reglan 10mg , Zofran Current muscle relaxants:  baclofen Current Antihypertensive medications:  metoprolol tartrate, amlodipine Current Antidepressant medications:  duloxetine 60mg  daily, mirtazapine 30mg   Current Anticonvulsant medications:  topiramate 50mg  QHS (for chronic pain by her pain doctor) Current anti-CGRP:  none Current Antihistamines/Decongestants:  Zyrtec Other therapy:  none Hormone/birth control:  none Other medications:  Nucala,  Yupelri, Seroquel, Lunesta  Caffeine:  no Alcohol:  occasional Smoker:  no Diet:  Drinks a lot of water.  Planning to have bariatric surgery Exercise:  she does but limited due to chronic pain (RA and fibromyalgia) Depression:  yes; Anxiety:  yes.  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 CT head on 03/10/2020 personally reviewed normal.  EEG on 03/12/2020 normal. Other pain:  Chronic pain syndrome - has RA but also underlying fibromyalgia suspected.  Followed by physical medicine and rehab Sleep hygiene:  Poor sleep.  May be related to stress.  She has OSA.  CPAP  recently adjusted.   HISTORY: History of migraines since childhood but resolved in young adulthood.  They returned around 2021 but progressively has gotten worse.  Severe diffuse squeezing and stabbing headache including the neck.  Back of neck gets stiff.  Nausea, vomiting, blurred vision/or vision goes dark; She saw an ophthalmologist and had an unremarkable eye exam.  Sometimes bilateral hand tingling, photophobia and phonophobia.  Usually last all day.  They occur usually 4 days a week.  No known triggers.  Nothing helps them.  She also reports short term memory problems in 2021 as well.  She says she will sometimes leave the stove on or may forget conversations.  Prior thyroid tests okay.  Larey Seat a couple of weeks ago and hit the back of her head.  Has a lump on back of head.  MRI of brain with and without contrast on 09/24/2021 was normal.      Past NSAIDS/analgesics:  tramadol, ibuprofen, naproxen Past abortive triptans:  sumatriptan tab, rizatriptan Past abortive ergotamine:  none Past muscle relaxants:  Flexeril Past anti-emetic: Promethazine Past antihypertensive medications:  amlodipine, Lasix, HCTZ.  Beta blockers contraindicated due to severe asthma.   Past antidepressant medications:  duloxetine, escitalopram Past anticonvulsant medications:  Keppra, gabapentin, pregablin Past anti-CGRP:  none Past antihistamines/decongestants:  Flonase Other past therapies:  none    Family history of headache:  Mother (migraines)    PAST MEDICAL HISTORY: Past Medical History:  Diagnosis Date   Acute respiratory failure with hypoxia (HCC)    Anemia    Asthma    COVID-19 virus infection 05/16/2020   04/27/20 dx covid-19, not hospitalized   Diabetes (HCC)    on Metformin  Dysplastic nevus 09/24/2022   R ant sole - severe, Excised 10/14/22   Eczema    Edema    Heart problem    "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 2 puffs every 4 hours as needed only  if your can't catch your breath 18 g 11   albuterol (PROVENTIL) (2.5 MG/3ML) 0.083% nebulizer solution Take 3 mLs (2.5 mg total) by nebulization every 6 (six) hours as needed for wheezing or shortness of breath. 360 mL 5   amLODipine (NORVASC) 5 MG tablet Take by mouth.     Ascorbic Acid (VITAMIN C) 1000 MG tablet Take 1,000 mg by mouth daily.     baclofen (LIORESAL) 10 MG tablet TAKE 1 TABLET BY MOUTH AT BEDTIME AS NEEDED FOR MUSCLE SPASMS 30 tablet 3   blood glucose meter kit and supplies Dispense based on patient and insurance preference. Use up to four times daily as directed. (FOR ICD-10 E10.9, E11.9). 1 each 0   budesonide (PULMICORT) 0.5 MG/2ML nebulizer solution 2 mL every 12 (twelve) hours.     busPIRone (BUSPAR) 7.5 MG tablet Take 7.5 mg by mouth daily.     cetirizine (ZYRTEC) 10 MG tablet TAKE 1 TABLET BY MOUTH EVERY DAY 90 tablet 1   colestipol (COLESTID) 1 g tablet Take 2 g by mouth daily.     doxycycline (MONODOX) 100 MG capsule Take 100 mg by mouth 2 (two) times daily.     DULoxetine (CYMBALTA) 60 MG capsule Take by mouth.     EPINEPHrine 0.3 mg/0.3 mL IJ SOAJ injection Inject 0.3 mg into the muscle as needed for anaphylaxis. 1 each 1   Erenumab-aooe (AIMOVIG) 140 MG/ML SOAJ Inject 140 mg into the skin every 30 (thirty) days. 1.12 mL 11   eszopiclone (LUNESTA) 1 MG TABS tablet Take 1 tablet (1 mg total) by mouth at bedtime as needed for sleep. Take immediately before bedtime 30 tablet 3   Etanercept (ENBREL MINI) 50 MG/ML SOCT Inject 50 mg into the skin  once a week. 12 mL 0   famotidine (PEPCID) 20 MG tablet Take 1 tablet (20 mg total) by mouth daily. 30 tablet 2   ferrous sulfate 325 (65 FE) MG tablet TAKE 1 TABLET BY MOUTH TWICE A DAY 180 tablet 1   FLOWFLEX COVID-19 AG HOME TEST KIT See admin instructions.     Fluticasone-Umeclidin-Vilant (TRELEGY ELLIPTA) 200-62.5-25 MCG/ACT AEPB Inhale 1 puff into the lungs daily. 60 each 5   furosemide (LASIX) 20 MG tablet Take 20 mg by mouth daily.     hydrochlorothiazide (HYDRODIURIL) 25 MG tablet Take by mouth.     HYDROcodone-acetaminophen (NORCO/VICODIN) 5-325 MG tablet Take 1 tablet by mouth 2 (two) times daily as needed for moderate pain. 1-2 po q 4-6 hours prn pain, avoid driving, operating machinery while taking, may make drowsy 60 tablet 0   ipratropium-albuterol (DUONEB) 0.5-2.5 (3) MG/3ML SOLN Take 3 mLs by nebulization every 6 (six) hours as needed. 360 mL  11   meloxicam (MOBIC) 7.5 MG tablet      Mepolizumab (NUCALA) 100 MG/ML SOAJ Inject 100 mg into the skin every 28 (twenty-eight) days.     metoCLOPramide (REGLAN) 10 MG tablet Take by mouth.     metoprolol tartrate (LOPRESSOR) 50 MG tablet Take by mouth.     Milnacipran HCl (SAVELLA) 12.5 MG TABS Take 1 tablet (12.5 mg total) by mouth at bedtime. 90 tablet 3   mirtazapine (REMERON) 30 MG tablet Take by mouth.     montelukast (SINGULAIR) 10 MG tablet TAKE 1 TABLET BY MOUTH EVERYDAY AT BEDTIME 30 tablet 11   Multiple Vitamin (MULTIVITAMIN) tablet Take 1 tablet by mouth daily.     mupirocin ointment (BACTROBAN) 2 % Apply 1 Application topically daily. Qd to excision site 22 g 1   omeprazole (PRILOSEC) 40 MG capsule Take by mouth.     ondansetron (ZOFRAN-ODT) 8 MG disintegrating tablet Take by mouth.     pantoprazole (PROTONIX) 40 MG tablet TAKE 1 TABLET (40 MG TOTAL) BY MOUTH DAILY. TAKE 30-60 MIN BEFORE FIRST MEAL OF THE DAY 90 tablet 0   potassium chloride SA (KLOR-CON M) 20 MEQ tablet Take 1 tablet (20 mEq total) by mouth 2 (two) times  daily. 6 tablet 0   pregabalin (LYRICA) 200 MG capsule Take 1 capsule (200 mg total) by mouth 2 (two) times daily. 60 capsule 2   QUEtiapine (SEROQUEL) 100 MG tablet      revefenacin (YUPELRI) 175 MCG/3ML nebulizer solution Inhale into the lungs.     rizatriptan (MAXALT-MLT) 10 MG disintegrating tablet Take 1 tablet earliest onset of migraine.  May repeat in 2 hours if needed. Maximum 2 tablets in 24 hours     Thiamine HCl (VITAMIN B-1) 250 MG tablet Take by mouth.     topiramate (TOPAMAX) 25 MG tablet Take 2 tablets (50 mg total) by mouth at bedtime. 90 tablet 3   tretinoin (RETIN-A) 0.025 % cream Apply pea sized amount to face qhs for acne 45 g 11   triamcinolone (KENALOG) 0.025 % ointment Apply 1 Application topically 2 (two) times daily. 30 g 2   Ubrogepant (UBRELVY) 100 MG TABS Take 1 tablet (100 mg total) by mouth as needed. May repeat after 2 hours.  Maximum 2 tablets in 24 hours. 10 tablet 5   Vitamin D, Ergocalciferol, (DRISDOL) 1.25 MG (50000 UNIT) CAPS capsule      zonisamide (ZONEGRAN) 100 MG capsule Take by mouth.     No current facility-administered medications on file prior to visit.    ALLERGIES: Allergies  Allergen Reactions   Ferumoxytol Anaphylaxis    ~5 years ago. Tolerated IV venofer   Phenytoin Anaphylaxis    Other reaction(s): swelling and heart rate decreased   Pecan Nut (Diagnostic) Other (See Comments)   Phenylbutazones Other (See Comments)    FAMILY HISTORY: Family History  Problem Relation Age of Onset   High blood pressure Mother    Rheum arthritis Mother    Diabetes Father    Diabetes Sister    Heart Problems Sister    Diabetes Brother    Diabetes Paternal Grandmother    Diabetes Other        father's side "everybody died from Diabetes"   High blood pressure Other        mother's side, multiple siblings with this    Heart attack Other        family member on mother's side    Diabetes Paternal Aunt  Seizures Cousin        not sibings to the  other cousins with seizures   Breast cancer Cousin    Seizures Cousin        not sibings to the other cousins with seizures   Seizures Cousin        not sibings to the other cousins with seizures   Cervical cancer Maternal Aunt    Breast cancer Cousin    Dementia Maternal Aunt    Asthma Maternal Aunt    Heart Problems Maternal Grandmother    Diabetes Brother    Asthma Daughter    Angioedema Daughter    Asthma Son       Objective:  Blood pressure 112/67, pulse 70, height 5\' 3"  (1.6 m), weight 146 lb (66.2 kg), SpO2 98 %. General: No acute distress.  Patient appears well-groomed.      Shon Millet, DO  CC: Trena Platt, MD

## 2022-10-29 ENCOUNTER — Telehealth: Payer: Self-pay

## 2022-10-29 ENCOUNTER — Telehealth: Payer: Self-pay | Admitting: *Deleted

## 2022-10-29 ENCOUNTER — Telehealth: Payer: Self-pay | Admitting: Pulmonary Disease

## 2022-10-29 ENCOUNTER — Encounter: Payer: Self-pay | Admitting: Urology

## 2022-10-29 MED ORDER — NITROFURANTOIN MONOHYD MACRO 100 MG PO CAPS: 100.0000 mg | ORAL_CAPSULE | Freq: Two times a day (BID) | ORAL | 0 refills | Status: AC

## 2022-10-29 NOTE — Telephone Encounter (Signed)
Patient returned call. Pharmacy is [facility] in [place].

## 2022-10-29 NOTE — Telephone Encounter (Signed)
LM on triage line stating Geraldine Contras had left her a message.  See notes.

## 2022-10-29 NOTE — Telephone Encounter (Signed)
PT calling because her case manager (manages her health) visited with her today and asked why her apartment was so hot. Apparently the [facility] has been out and the landlord is ignoring her requests for repair. She states she was told to get a letter from Dr. Craige Cotta saying she needed [facility] because her asthma is exteremly aggravated by the heat.  She has been on a ventilator before because of her asthma and does not want to go thru that again. Sending this back high priority because you can hear in her voice how bad it is.   Please call PT to advise @ [phone]

## 2022-10-29 NOTE — Telephone Encounter (Signed)
.  left message to have patient return my call.  Need to know what pharmacy to sent rx to

## 2022-10-29 NOTE — Telephone Encounter (Signed)
-----   Message from Alfredo Martinez, MD sent at 10/28/2022 11:10 AM EDT ----- Macrodantin 100 mg twice a day for 7 days ----- Message ----- From: Sueanne Margarita, CMA Sent: 10/23/2022   7:56 AM EDT To: Alfredo Martinez, MD   ----- Message ----- From: Interface, [facility] Lab Results In Sent: 10/20/2022  11:36 AM EDT To: Jennette Kettle [facility]

## 2022-10-29 NOTE — Telephone Encounter (Signed)
rx sent to pharmacy by e-script  

## 2022-10-30 ENCOUNTER — Encounter: Payer: Self-pay | Admitting: Neurology

## 2022-10-30 ENCOUNTER — Ambulatory Visit (INDEPENDENT_AMBULATORY_CARE_PROVIDER_SITE_OTHER): Payer: Medicare Other | Admitting: Neurology

## 2022-10-30 ENCOUNTER — Other Ambulatory Visit: Payer: Self-pay

## 2022-10-30 VITALS — BP 112/67 | HR 70 | Ht 63.0 in | Wt 146.0 lb

## 2022-10-30 DIAGNOSIS — G43009 Migraine without aura, not intractable, without status migrainosus: Secondary | ICD-10-CM | POA: Diagnosis not present

## 2022-10-30 MED ORDER — UBRELVY 100 MG PO TABS: 1.0000 | ORAL_TABLET | ORAL | 11 refills | Status: DC | PRN

## 2022-10-30 NOTE — Telephone Encounter (Signed)
[facility], please advise on writing a letter for patient.

## 2022-10-31 ENCOUNTER — Encounter: Payer: Self-pay | Admitting: Dermatology

## 2022-10-31 ENCOUNTER — Encounter: Payer: Self-pay | Admitting: Pulmonary Disease

## 2022-10-31 NOTE — Telephone Encounter (Signed)
Letter printed at [facility] office.  Left on Elizabeth Bautista's desk to be sent to the patient.

## 2022-11-03 ENCOUNTER — Telehealth: Payer: Self-pay | Admitting: Pulmonary Disease

## 2022-11-03 NOTE — Telephone Encounter (Signed)
Called and spoke with patient. Patient stated that she wanted [facility] phone number to call them to see where her nebulizer machine was. Gave patient [facility] phone number. Advised patient if she needed anything else to give [place] a call.   Nothing further needed.

## 2022-11-04 NOTE — Telephone Encounter (Signed)
d 

## 2022-11-05 NOTE — Telephone Encounter (Signed)
Entered in error

## 2022-11-06 NOTE — Progress Notes (Signed)
Office Visit Note  Patient: Elizabeth Bautista             Date of Birth: [birth year]           MRN: [id number]             PCP: Nancy Marus, NP Referring: No ref. provider found Visit Date: 11/07/2022   Subjective:  Follow-up (Patient states she is not taking the enbrel shot anymore. Patient states she is having a lot of pain and needs something different. )   History of Present Illness: Elizabeth Bautista is a 47 y.o. female here for follow up for seropositive RA currently off treatment she discontinued Enbrel treatment after she had new findings of dysplastic nevus with surgical excision on the bottom of the right foot and plan for follow-up excision of 1 on the left foot.  She stopped taking the medicine for concern of this contributing to skin cancer risk.  Previously had no known personal or family history for cancer.  Since discontinuing the Enbrel she has increased pain and stiffness in multiple areas.  Starting to see additional swelling again in the right knee and right hand most frequently.   Previous HPI 08/05/22 Elizabeth Bautista is a 47 y.o. female here for follow up for seropositive RA on Enbrel 50 mg subcu weekly.  So far she feels the medication is improved joint pain and stiffness in most of her joints except she still having a lot of trouble with the persistent right knee pain and swelling as well as her chronic low back pain with sciatica.  She is using a knee brace and cane to offload the right knee due to pain with use.  She is trying to exercise including climbing stairs and walking but develops worsening knee pain afterwards that improves after several hours or a day of rest.  She is seeing Dr. Carlis Abbott for management of chronic pain with fibromyalgia with back pain muscle pains and sciatica still giving her trouble especially at night.   Previous HPI 03/11/22 Elizabeth Bautista is a 47 y.o. female here for follow up for seropositive RA after starting Enbrel 50  mg [place] weekly  doses so far with 6th due today. So far she sees zero benefit with right knee pain and swelling is still severe. She has bruising and itching around each injection site no improvement trying to ice the area or taking benadryl for the itching.   Previous HPI 01/15/2022 Elizabeth Bautista is a 47 y.o. female here for follow up for seropositive RA on HCQ 400 mg daily. We had previously delayed addition of medication possible Enbrel due to surgery and more recently was recovering from medical issues. She saw Dr. Romeo Apple recently and right knee warmth and effusion present with MRI obtained appearing consistent with active inflammatory arthritis. She is having a lot of pain and stiffness in her right knee and exacerbated with walking and weight bearing.   Previous HPI 11/06/21  Elizabeth Bautista is a 47 y.o. 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 She was last seen almost a year ago with discussion for considering addition of Enbrel due to continued pain and inflammation. This was initially delayed due to anticipating upcoming bariatric surgery. She saw Dr. Wynn Banker for ongoing pain in multiple areas and felt to represent FMS and she started Lyrica with partial improvement in pain. She was hospitalized in February with severe asthma exacerbation including ICU stay. She had the  surgery on 5/15 without major complication. She is off her medications since the surgery and has been struggling some with maintaining hydration status.   Previous HPI 12/25/2020 Elizabeth Bautista is a 47 y.o. 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 She does not feel the medication has controlled symptoms well she has persistent stiffness in her hands and knees in the mornings and knee pain persists throughout the day. Her wrist has decreased in swelling. Her left shoulder is painful with decreased range of movement limited by pain. She has had asthma  exacerbations treated with prednisone but currently trying to avoid this due to upcoming bariatric surgery plans.   Previous HPI: 10/11/20 Elizabeth Bautista is a 47 y.o. female here for evaluation and management of rheumatoid arthritis. Symptoms started at least since 2017 with hand, wrist, and knee pain and swelling periodically and worsened over time. She did not seek medication evaluation for some time due to being very busy with work and symptoms progressively increased. She was hospitalized for severe pain and swelling especially in the left wrist and knee and workup at that time was more extensive and revealed positive RA serology suspected as the cause. She saw rheumatology since 2020 and started treatment initial with prednisone that did not control symptoms well but caused a lot of weight gain. She has had chronic steroid exposure due to refractory asthma symptoms over the years. She also started hydroxychloroquine that apparently helped symptoms reasonably well initially but has worsened. She moved form [place] a year ago due to safety circumstances and has some family in the area and is continuing HCQ treatment but overall having a lot of joint pain. Currently feels swelling and heat in the joints multiple days per week and walks with either a cane or walker for her left knee pain and instability depending on how badly it is acting up. She was recently hospitalized for asthma exacerbation on 4/28 and discharged 5/2.   Labs reviewed 10/2018 RF ~30, CCP >250 ANA neg   Imaging reviewed Chest xray 08/2020 Cardiomegaly no airspace disease   Lumbar spine xray 08/2020 No acute findings   Review of Systems  Constitutional:  Positive for fatigue.  HENT:  Negative for mouth sores and mouth dryness.   Eyes:  Positive for dryness.  Respiratory:  Positive for shortness of breath.   Cardiovascular:  Positive for chest pain and palpitations.  Gastrointestinal:  Positive for diarrhea. Negative for blood in  stool and constipation.  Endocrine: Positive for increased urination.  Genitourinary:  Positive for involuntary urination.  Musculoskeletal:  Positive for joint pain, gait problem, joint pain, joint swelling, myalgias, muscle weakness, morning stiffness, muscle tenderness and myalgias.  Skin:  Positive for rash, hair loss and sensitivity to sunlight. Negative for color change.  Allergic/Immunologic: Positive for susceptible to infections.  Neurological:  Positive for dizziness and headaches.  Hematological:  Positive for swollen glands.  Psychiatric/Behavioral:  Positive for depressed mood and sleep disturbance. The patient is nervous/anxious.     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   Mixed stress and urge urinary incontinence 08/14/2022   Encounter for screening fecal occult blood testing 08/14/2022   Encounter for well woman exam with routine gynecological exam 08/14/2022   Decreased libido 08/14/2022   Perimenopause 08/14/2022   Screening  examination for STD (sexually transmitted disease) 08/14/2022   Otalgia of both ears 07/09/2022   Vitamin B1 deficiency 04/22/2022   Gastric ulcer 02/11/2022   Laryngopharyngeal reflux (LPR) 12/26/2021   History of gastric bypass 10/21/2021   Spinal stenosis of lumbar region with neurogenic claudication 08/27/2021   Fall 08/27/2021   Sinusitis 08/11/2021   Allergic rhinitis 08/07/2021   Hospital discharge follow-up 08/02/2021   (HFpEF) heart failure with preserved ejection fraction (HCC) 07/20/2021   Body mass index (BMI) 45.0-49.9, adult (HCC) 02/05/2021   Iron deficiency anemia due to chronic blood loss 10/17/2020   Tension-type headache, not intractable 10/17/2020   High risk medication use 10/11/2020   Acute hip pain, left 09/19/2020   Sciatica 09/11/2020    Dysmenorrhea 06/04/2020   Menorrhagia with irregular cycle 06/04/2020   Anxiety and depression 06/04/2020   Physical deconditioning 05/24/2020   Severe persistent asthma 03/08/2020   GERD (gastroesophageal reflux disease) 03/07/2020   Anxiety 03/07/2020   History of seizures 03/07/2020   Swelling of lower extremity 02/01/2020   Essential hypertension 02/01/2020   Vitamin D deficiency 02/01/2020   Type 2 diabetes mellitus with hyperglycemia, without long-term current use of insulin (HCC) 02/01/2020   Anemia 02/01/2020   Class 3 severe obesity with serious comorbidity and body mass index (BMI) of 45.0 to 49.9 in adult (HCC) 10/22/2019   OSA on CPAP 10/18/2019   Seropositive rheumatoid arthritis (HCC) 10/18/2019    Past Medical History:  Diagnosis Date   Acute respiratory failure with hypoxia (HCC)    Anemia    Asthma    COVID-19 virus infection 05/16/2020   04/27/20 dx covid-19, not hospitalized   Diabetes (HCC)    on Metformin   Dysplastic nevus 09/24/2022   R ant sole - severe, Excised 10/14/22   Eczema    Edema    Heart problem    "something with the arteries on the left side of the heart"; upcoming appt with cardiology for evaluation   HTN (hypertension)    Rheumatoid arthritis (HCC)    on Plaquenil    Seizures (HCC)    last seizure 2019   Sleep apnea    Sleep apnea     Family History  Problem Relation Age of Onset   High blood pressure Mother    Rheum arthritis Mother    Diabetes Father    Diabetes Sister    Heart Problems Sister    Diabetes Brother    Diabetes Paternal Grandmother    Diabetes Other        father's side "everybody died from Diabetes"   High blood pressure Other        mother's side, multiple siblings with this    Heart attack Other        family member on mother's side    Diabetes Paternal Aunt    Seizures Cousin        not sibings to the other cousins with seizures   Breast cancer Cousin    Seizures Cousin        not sibings to the  other cousins with seizures   Seizures Cousin        not sibings to the other cousins with seizures   Cervical cancer Maternal Aunt    Breast cancer Cousin    Dementia Maternal Aunt    Asthma Maternal Aunt    Heart Problems Maternal Grandmother    Diabetes Brother    Asthma Daughter    Angioedema Daughter    Asthma  Son    Past Surgical History:  Procedure Laterality Date   APPENDECTOMY     CESAREAN SECTION     x3   CHOLECYSTECTOMY  02/17/2022   FOOT SURGERY  2024   GASTRIC BYPASS     LAPAROSCOPIC GASTROTOMY W/ REPAIR OF ULCER  02/17/2022   TUBAL LIGATION  2003   Social History   Social History Narrative   Divorced.Lives with 3 kids.Originally from [place].Came from [place] ,[place] 7 months ago.      02/27/2020   Right handed   Caffeine: none     There is no immunization history on file for this patient.   Objective: Vital Signs: BP 106/71 (BP Location: Left Arm, Patient Position: Sitting, Cuff Size: Normal)   Pulse 77   Resp 14   Ht 5\' 4"  (1.626 m)   Wt 148 lb (67.1 kg)   LMP 11/03/2022   BMI 25.40 kg/m    Physical Exam Eyes:     Conjunctiva/sclera: Conjunctivae normal.  Cardiovascular:     Rate and Rhythm: Normal rate and regular rhythm.  Pulmonary:     Effort: Pulmonary effort is normal.     Breath sounds: Normal breath sounds.  Musculoskeletal:     Comments: Trace pitting edema in right leg distal to the knee  Lymphadenopathy:     Cervical: No cervical adenopathy.  Skin:    General: Skin is warm and dry.     Findings: No rash.  Neurological:     Mental Status: She is alert.  Psychiatric:        Mood and Affect: Mood normal.      Musculoskeletal Exam:  Shoulders full ROM no tenderness or swelling Elbows full ROM no tenderness or swelling Mild right wrist swelling and tenderness to pressure Tenderness of right second third MCP joints without palpable synovitis, flexion range of motion normal Right knee tenderness and small effusion present   CDAI  Exam: CDAI Score: 11  Patient Global: 30 mm; Provider Global: 20 mm Swollen: 2 ; Tender: 4  Joint Exam 11/07/2022      Right  Left  Wrist  Swollen Tender     MCP 2   Tender     MCP 3   Tender     Knee  Swollen Tender        Investigation: No additional findings.  Imaging: No results found.  Recent Labs: Lab Results  Component Value Date   WBC 4.6 08/05/2022   HGB 11.6 (L) 08/05/2022   PLT 206 08/05/2022   NA 140 08/05/2022   K 3.9 08/05/2022   CL 106 08/05/2022   CO2 25 08/05/2022   GLUCOSE 78 08/05/2022   BUN 8 08/05/2022   CREATININE 0.52 08/05/2022   BILITOT 0.6 08/05/2022   ALKPHOS 85 06/09/2022   AST 23 08/05/2022   ALT 19 08/05/2022   PROT 5.9 (L) 08/05/2022   ALBUMIN 3.2 (L) 06/09/2022   CALCIUM 8.5 (L) 08/05/2022   GFRAA 121 10/11/2020   QFTBGOLDPLUS NEGATIVE 01/15/2022    Speciality Comments: PLQ EYE EXAM 09/12/2021 [place] f/u 2-3 months Enbrel started 01/29/11  Procedures:  No procedures performed Allergies: Ferumoxytol, Phenytoin, Pecan nut (diagnostic), and Phenylbutazones   Assessment / Plan:     Visit Diagnoses: Seropositive rheumatoid arthritis (HCC) - Plan: Sedimentation rate, C-reactive protein  Appears moderately active with some synovitis more notable on the right side overall not too severe compared to her original presentation. Inflammation may be somewhat better due to her very successful weight loss  as well.  Lack of response to hydroxychloroquine, and now concern for developing multiple dysplastic nevi on Enbrel treatment.  Previously did not start on methotrexate with baseline liver function tests have been abnormal intermittently and existing hepatic steatosis.  Will check sed rate and CRP today as well for disease activity monitoring.  Discussed treatment options recommend changing to Orencia 125mg  [place] weekly.  High risk medication use - Plan: CBC with Differential/Platelet, COMPLETE METABOLIC PANEL WITH GFR  Checking CBC and CMP for  medication monitoring planning discharge to Orencia.  Previous QuantiFERON screening from August was negative.  Medication safety and side effect profile is a primary concern for her based on recent history.  I discussed that RA treatments function by affecting the immune system and any suppression of the immune system will have a nonzero risk.  I think the lowest risk option with concern about skin cancer in her medical history would be Orencia.  No personal history of COPD or lymphoma.  Fibromyalgia Chronic pain syndrome  On Lyrica and Cymbalta overall widespread pain does not appear to be in exacerbation.  May be contributing with current complaints but with some peripheral synovitis on exam expect currently worse due to increase in inflammatory activity with stopping medication.  Orders: Orders Placed This Encounter  Procedures   Sedimentation rate   C-reactive protein   CBC with Differential/Platelet   COMPLETE METABOLIC PANEL WITH GFR   No orders of the defined types were placed in this encounter.    Follow-Up Instructions: Return in about 3 months (around 02/07/2023) for RA Orencia start f/u 3mos.   Fuller Plan, MD  Note - This record has been created using [employer].  Chart creation errors have been sought, but may not always  have been located. Such creation errors do not reflect on  the standard of medical care.

## 2022-11-07 ENCOUNTER — Encounter: Payer: Self-pay | Admitting: Internal Medicine

## 2022-11-07 ENCOUNTER — Telehealth: Payer: Self-pay | Admitting: Pharmacist

## 2022-11-07 ENCOUNTER — Ambulatory Visit: Payer: Medicare Other | Attending: Internal Medicine | Admitting: Internal Medicine

## 2022-11-07 VITALS — BP 106/71 | HR 77 | Resp 14 | Ht 64.0 in | Wt 148.0 lb

## 2022-11-07 DIAGNOSIS — M797 Fibromyalgia: Secondary | ICD-10-CM | POA: Insufficient documentation

## 2022-11-07 DIAGNOSIS — G894 Chronic pain syndrome: Secondary | ICD-10-CM | POA: Diagnosis present

## 2022-11-07 DIAGNOSIS — M059 Rheumatoid arthritis with rheumatoid factor, unspecified: Secondary | ICD-10-CM

## 2022-11-07 DIAGNOSIS — Z79899 Other long term (current) drug therapy: Secondary | ICD-10-CM

## 2022-11-07 LAB — CBC WITH DIFFERENTIAL/PLATELET
Basophils Absolute: 21 cells/uL (ref 0–200)
HCT: 23.8 % — ABNORMAL LOW (ref 35.0–45.0)
Hemoglobin: 7 g/dL — ABNORMAL LOW (ref 11.7–15.5)
MCHC: 29.4 g/dL — ABNORMAL LOW (ref 32.0–36.0)

## 2022-11-07 NOTE — Patient Instructions (Signed)
Abatacept Injection What is this medication? ABATACEPT (a ba TA sept) treats autoimmune conditions, such as arthritis. It may also be used to treat a condition that can occur after a stem cell or bone marrow transplant (chronic graft versus host disease). It works by slowing down an overactive immune system. This medicine may be used for other purposes; ask your health care provider or pharmacist if you have questions. COMMON BRAND NAME(S): Reatha Armour ClickJect What should I tell my care team before I take this medication? They need to know if you have any of these conditions: Cancer Diabetes Having surgery Hepatitis B or history of hepatitis B infection Immune system problems Infection, especially a viral infection, such as chickenpox, cold sores, herpes Lung or breathing problems, such as asthma or COPD Recent or upcoming vaccine Tuberculosis, a positive skin test for tuberculosis, or have recently been in close contact with someone who has tuberculosis An unusual or allergic reaction to abatacept, other medications, foods, dyes, or preservatives Pregnant or trying to get pregnant Breastfeeding How should I use this medication? This medication is infused into a vein or injected under the skin. Infusions are given by your care team in a hospital or clinic setting. It may be injected under the skin at home. If you get this medication at home, you will be taught how to prepare and give it. Use exactly as directed. Take it as directed on the prescription label. Keep taking it unless your care team tells you to stop. It is important that you put your used needles and syringes in a special sharps container. Do not put them in a trash can. If you do not have a sharps container, call your pharmacist or care team to get one. Talk to your care team about the use of this medication in children. While it may be prescribed for children as young as 2 years for selected conditions, precautions do  apply. Overdosage: If you think you have taken too much of this medicine contact a poison control center or emergency room at once. NOTE: This medicine is only for you. Do not share this medicine with others. What if I miss a dose? If you miss a dose, take it as soon as you can. If it is almost time for your next dose, take only that dose. Do not take double or extra doses. What may interact with this medication? Do not take this medication with any of the following: Live virus vaccines This medication may also interact with the following: Anakinra Baricitinib Canakinumab Medications that lower your chance of fighting an infection Rituximab TNF blockers, such as adalimumab, certolizumab, etanercept, golimumab, infliximab Tocilizumab Tofacitinib Upadacitinib Ustekinumab This list may not describe all possible interactions. Give your health care provider a list of all the medicines, herbs, non-prescription drugs, or dietary supplements you use. Also tell them if you smoke, drink alcohol, or use illegal drugs. Some items may interact with your medicine. What should I watch for while using this medication? Visit your care team for regular checks on your progress. Tell your care team if your symptoms do not start to get better or if they get worse. You will be tested for tuberculosis (TB) before you start this medication. If your care team prescribed any medication for TB, you should start taking the TB medication before starting this medication. Make sure to finish the full course of TB medication. This medication may increase your risk of getting an infection. Call your care team if you get fever,  chills, or sore throat, or other symptoms of a cold or flu. Do not treat yourself. Try to avoid being around people who are sick. If you have diabetes and are getting this medication as an infusion, it may give false high blood sugar readings on the day of your dose. This may happen if you use certain  types of blood glucose tests. Your care team may tell you to use a different way to monitor your blood sugar levels. What side effects may I notice from receiving this medication? Side effects that you should report to your care team as soon as possible: Allergic reactions--skin rash, itching, hives, swelling of the face, lips, tongue, or throat Infection--fever, chills, cough, sore throat, wounds that don't heal, pain or trouble when passing urine, general feeling of discomfort or being unwell Side effects that usually do not require medical attention (report to your care team if they continue or are bothersome): Back pain Cough Dizziness Headache Runny or stuffy nose Sore throat This list may not describe all possible side effects. Call your doctor for medical advice about side effects. You may report side effects to FDA at 1-800-FDA-1088. Where should I keep my medication? If you take this medication at home, keep out of the reach of children and pets. Store in the refrigerator. Keep this medication in the original container. Protect from light. Do not freeze. Do not shake. Get rid of any unused medication after the expiration date. To get rid of medications that are no longer needed or have expired: Take the medication to a medication take-back program. Check with your pharmacy or law enforcement to find a location. If you cannot return the medication, ask your pharmacist or care team how to get rid of the medication safely. NOTE: This sheet is a summary. It may not cover all possible information. If you have questions about this medicine, talk to your doctor, pharmacist, or health care provider.  2024 Elsevier/Gold Standard (2021-10-17 00:00:00)

## 2022-11-07 NOTE — Telephone Encounter (Addendum)
Submitted a Prior Authorization request to [facility] for [facility] via CoverMyMeds. Will update once we receive a response.  Key: [id number]   ----- Message from Metta Clines, RT sent at 11/07/2022  8:35 AM EDT ----- Regarding: New Start Orencia SQ

## 2022-11-08 ENCOUNTER — Telehealth: Payer: Self-pay | Admitting: Rheumatology

## 2022-11-08 LAB — CBC WITH DIFFERENTIAL/PLATELET
Absolute Monocytes: 365 cells/uL (ref 200–950)
Basophils Relative: 0.5 %
Eosinophils Absolute: 168 cells/uL (ref 15–500)
Eosinophils Relative: 4.1 %
Lymphs Abs: 1316 cells/uL (ref 850–3900)
MCH: 23 pg — ABNORMAL LOW (ref 27.0–33.0)
MCV: 78.3 fL — ABNORMAL LOW (ref 80.0–100.0)
MPV: 11.9 fL (ref 7.5–12.5)
Monocytes Relative: 8.9 %
Neutro Abs: 2230 cells/uL (ref 1500–7800)
Neutrophils Relative %: 54.4 %
Platelets: 286 10*3/uL (ref 140–400)
RBC: 3.04 10*6/uL — ABNORMAL LOW (ref 3.80–5.10)
RDW: 15.3 % — ABNORMAL HIGH (ref 11.0–15.0)
Total Lymphocyte: 32.1 %
WBC: 4.1 10*3/uL (ref 3.8–10.8)

## 2022-11-08 LAB — COMPLETE METABOLIC PANEL WITH GFR
AG Ratio: 1.6 (calc) (ref 1.0–2.5)
ALT: 9 U/L (ref 6–29)
AST: 12 U/L (ref 10–35)
Albumin: 3.6 g/dL (ref 3.6–5.1)
Alkaline phosphatase (APISO): 121 U/L (ref 31–125)
BUN: 8 mg/dL (ref 7–25)
CO2: 27 mmol/L (ref 20–32)
Calcium: 8.3 mg/dL — ABNORMAL LOW (ref 8.6–10.2)
Chloride: 106 mmol/L (ref 98–110)
Creat: 0.54 mg/dL (ref 0.50–0.99)
Globulin: 2.3 g/dL (calc) (ref 1.9–3.7)
Glucose, Bld: 85 mg/dL (ref 65–99)
Potassium: 3.9 mmol/L (ref 3.5–5.3)
Sodium: 139 mmol/L (ref 135–146)
Total Bilirubin: 0.3 mg/dL (ref 0.2–1.2)
Total Protein: 5.9 g/dL — ABNORMAL LOW (ref 6.1–8.1)
eGFR: 113 mL/min/{1.73_m2} (ref >=60)

## 2022-11-08 LAB — C-REACTIVE PROTEIN: CRP: <3 mg/L (ref <8.0)

## 2022-11-08 LAB — SEDIMENTATION RATE: Sed Rate: 6 mm/h (ref 0–20)

## 2022-11-08 NOTE — Telephone Encounter (Signed)
I received a phone call from the lab at 11:11 AM that patient's hemoglobin was 7.0.  I called patient and advised patient of the lab results.  Patient stated that she has been seeing a hematologist, Dr.Tsagianni.  Patient states that she has been having heavy menstrual cycle.  Recently she has been experiencing headaches, dizziness and increased fatigue.  She states that she has tried calling hematology office but did not get a response.  I advised her to go to the emergency room.  Patient voiced understanding. Pollyann Savoy, MD

## 2022-11-11 ENCOUNTER — Other Ambulatory Visit: Payer: Self-pay | Admitting: Family Medicine

## 2022-11-11 NOTE — Telephone Encounter (Signed)
Submitted an URGENT appeal to [facility] for [employer] If accepted for expedited review, turnaround time is 3 business days  Reference # [id number] Phone: [phone] Fax: [phone]  Chesley Mires, PharmD, MPH, BCPS, CPP Clinical Pharmacist (Rheumatology and Pulmonology)

## 2022-11-11 NOTE — Telephone Encounter (Signed)
Received a fax regarding Prior Authorization from [facility] for [facility]. Authorization has been DENIED because patient must try or have contraindication to one additional preferred options (Rinvoq or Harriette Ohara).  Will work on appeal  Chesley Mires, PharmD, MPH, BCPS, CPP Clinical Pharmacist (Rheumatology and Pulmonology)

## 2022-11-12 ENCOUNTER — Ambulatory Visit: Payer: Medicare Other | Admitting: Family Medicine

## 2022-11-13 ENCOUNTER — Other Ambulatory Visit: Payer: Self-pay

## 2022-11-13 ENCOUNTER — Other Ambulatory Visit (HOSPITAL_COMMUNITY): Payer: Self-pay

## 2022-11-13 MED ORDER — ORENCIA CLICKJECT 125 MG/ML ~~LOC~~ SOAJ
125.0000 mg | SUBCUTANEOUS | 0 refills | Status: DC
Start: 2022-11-13 — End: 2023-01-29
  Filled 2022-11-13: qty 12, fill #0
  Filled 2022-11-18: qty 4, 28d supply, fill #0
  Filled 2022-12-10 – 2022-12-11 (×2): qty 4, 28d supply, fill #1
  Filled 2023-01-09: qty 4, 28d supply, fill #2

## 2022-11-13 NOTE — Telephone Encounter (Signed)
Patient returned call. She confirmed address on file is correct for shipping to home. Rx sent to [facility]. She has been advise dit will likely not reach her home until next week.  Reviewed injection sites, storage, and leaving med at room temperature for at least 30 minutes prior to administration. She verbalized understanding. Feels comfortable starting at home.  Chesley Mires, PharmD, MPH, BCPS, CPP Clinical Pharmacist (Rheumatology and Pulmonology)

## 2022-11-13 NOTE — Telephone Encounter (Signed)
Received notification from [facility] regarding a prior authorization for [facility]. Authorization has been APPROVED from 11/07/22 until further notice. Approval letter sent to scan center.  Per test claim, copay for 28 days supply is $0  Patient can fill through [facility]: [phone]   Authorization # [id number] Phone # [phone]  ATC patient about approval & starting medication, unable to reach, left voicemail for call back.

## 2022-11-17 ENCOUNTER — Other Ambulatory Visit (HOSPITAL_COMMUNITY): Payer: Self-pay

## 2022-11-18 ENCOUNTER — Other Ambulatory Visit: Payer: Self-pay

## 2022-11-18 ENCOUNTER — Other Ambulatory Visit (HOSPITAL_COMMUNITY): Payer: Self-pay

## 2022-11-18 NOTE — Telephone Encounter (Signed)
Delivery instructions have been updated in [place], medication will be shipped to patient's home address on 11/19/22.  Rx has been processed in [facility] and the patient has no copay at this time.

## 2022-11-25 DIAGNOSIS — R45851 Suicidal ideations: Secondary | ICD-10-CM | POA: Insufficient documentation

## 2022-12-01 ENCOUNTER — Other Ambulatory Visit: Payer: Medicare Other | Admitting: Urology

## 2022-12-10 ENCOUNTER — Other Ambulatory Visit: Payer: Self-pay | Admitting: Dermatology

## 2022-12-10 ENCOUNTER — Other Ambulatory Visit (HOSPITAL_COMMUNITY): Payer: Self-pay

## 2022-12-10 ENCOUNTER — Other Ambulatory Visit: Payer: Self-pay | Admitting: Physical Medicine and Rehabilitation

## 2022-12-10 DIAGNOSIS — D239 Other benign neoplasm of skin, unspecified: Secondary | ICD-10-CM

## 2022-12-10 NOTE — Telephone Encounter (Signed)
Called pharmacy spoke [facility] pharmacy re: Elizabeth Bautista no longer covered by pharmacy. Asking for alternative medication.
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
## 2022-12-11 ENCOUNTER — Other Ambulatory Visit: Payer: Self-pay

## 2022-12-11 ENCOUNTER — Other Ambulatory Visit (HOSPITAL_COMMUNITY): Payer: Self-pay

## 2022-12-17 ENCOUNTER — Telehealth: Payer: Self-pay

## 2022-12-17 NOTE — Telephone Encounter (Signed)
PA for ESZOPICLONE 1 MG IN COVER MY MEDS. [facility])

## 2022-12-19 ENCOUNTER — Ambulatory Visit: Payer: Medicare Other | Admitting: Physical Medicine and Rehabilitation

## 2022-12-24 NOTE — Telephone Encounter (Signed)
Approved on July 17

## 2022-12-25 ENCOUNTER — Ambulatory Visit: Payer: Medicare Other | Admitting: Dermatology

## 2023-01-07 ENCOUNTER — Other Ambulatory Visit (HOSPITAL_COMMUNITY): Payer: Self-pay

## 2023-01-08 ENCOUNTER — Other Ambulatory Visit: Payer: Self-pay | Admitting: Physical Medicine and Rehabilitation

## 2023-01-08 DIAGNOSIS — E113499 Type 2 diabetes mellitus with severe nonproliferative diabetic retinopathy without macular edema, unspecified eye: Secondary | ICD-10-CM | POA: Insufficient documentation

## 2023-01-09 ENCOUNTER — Other Ambulatory Visit (HOSPITAL_COMMUNITY): Payer: Self-pay

## 2023-01-14 ENCOUNTER — Other Ambulatory Visit: Payer: Self-pay | Admitting: Pulmonary Disease

## 2023-01-16 ENCOUNTER — Encounter: Payer: Medicare Other | Admitting: Physical Medicine and Rehabilitation

## 2023-01-19 ENCOUNTER — Other Ambulatory Visit: Payer: Medicare Other | Admitting: Urology

## 2023-01-21 ENCOUNTER — Ambulatory Visit: Payer: Medicare Other | Admitting: Dermatology

## 2023-01-29 ENCOUNTER — Other Ambulatory Visit: Payer: Self-pay | Admitting: Internal Medicine

## 2023-01-29 ENCOUNTER — Other Ambulatory Visit (HOSPITAL_COMMUNITY): Payer: Self-pay

## 2023-01-29 DIAGNOSIS — M059 Rheumatoid arthritis with rheumatoid factor, unspecified: Secondary | ICD-10-CM

## 2023-01-29 DIAGNOSIS — Z79899 Other long term (current) drug therapy: Secondary | ICD-10-CM

## 2023-01-29 NOTE — Telephone Encounter (Signed)
Last Fill: 11/13/2022  Labs: 11/28/2022 RBC 3.59, Hgb 8.2, Hct 26.4, MCV 73.5, MCH 22.8, MCHC 31.1, RDW 19.1, MPV 11.3, Anion Gap 12, BUN 7, Creat. 0.51, Calcium 7.7  TB Gold: 01/15/2022 Neg    Next Visit: 02/12/2023  Last Visit: 11/07/2022  [id number] rheumatoid arthritis   Current Dose per office note 11/07/2022: Orencia 125mg  Mar-Mac weekly.   Patient to update labs at upcoming appointment on 02/12/2023  Okay to refill Orencia?

## 2023-01-30 ENCOUNTER — Other Ambulatory Visit: Payer: Self-pay

## 2023-01-30 ENCOUNTER — Ambulatory Visit (HOSPITAL_BASED_OUTPATIENT_CLINIC_OR_DEPARTMENT_OTHER): Payer: Medicare Other | Admitting: Pulmonary Disease

## 2023-01-30 ENCOUNTER — Other Ambulatory Visit (HOSPITAL_COMMUNITY): Payer: Self-pay

## 2023-01-30 MED ORDER — ORENCIA CLICKJECT 125 MG/ML ~~LOC~~ SOAJ
125.0000 mg | SUBCUTANEOUS | 0 refills | Status: DC
Start: 2023-01-30 — End: 2023-03-10
  Filled 2023-01-30: qty 4, 28d supply, fill #0

## 2023-02-03 NOTE — Progress Notes (Deleted)
Office Visit Note  Patient: Elizabeth Bautista             Date of Birth: 16-Feb-1976           MRN: [id number]             PCP: Nancy Marus, NP Referring: Nancy Marus, NP Visit Date: 02/12/2023   Subjective:  No chief complaint on file.   History of Present Illness: Elizabeth Bautista is a 47 y.o. female here for follow up for seropositive RA she discontinued Enbrel treatment after she had new findings of dysplastic nevus with surgical excision on the bottom of the right foot and plan for follow-up excision of 1 on the left foot.    Previous HPI 11/07/2022 Elizabeth Bautista is a 47 y.o. female here for follow up for seropositive RA currently off treatment she discontinued Enbrel treatment after she had new findings of dysplastic nevus with surgical excision on the bottom of the right foot and plan for follow-up excision of 1 on the left foot.  She stopped taking the medicine for concern of this contributing to skin cancer risk.  Previously had no known personal or family history for cancer.  Since discontinuing the Enbrel she has increased pain and stiffness in multiple areas.  Starting to see additional swelling again in the right knee and right hand most frequently.     Previous HPI 08/05/22 Elizabeth Bautista is a 47 y.o. female here for follow up for seropositive RA on Enbrel 50 mg subcu weekly.  So far she feels the medication is improved joint pain and stiffness in most of her joints except she still having a lot of trouble with the persistent right knee pain and swelling as well as her chronic low back pain with sciatica.  She is using a knee brace and cane to offload the right knee due to pain with use.  She is trying to exercise including climbing stairs and walking but develops worsening knee pain afterwards that improves after several hours or a day of rest.  She is seeing Dr. Carlis Abbott for management of chronic pain with fibromyalgia with back pain muscle pains and  sciatica still giving her trouble especially at night.   Previous HPI 03/11/22 Elizabeth Bautista is a 47 y.o. female here for follow up for seropositive RA after starting Enbrel 50 mg [place] weekly  doses so far with 6th due today. So far she sees zero benefit with right knee pain and swelling is still severe. She has bruising and itching around each injection site no improvement trying to ice the area or taking benadryl for the itching.   Previous HPI 01/15/2022 Elizabeth Bautista is a 47 y.o. female here for follow up for seropositive RA on HCQ 400 mg daily. We had previously delayed addition of medication possible Enbrel due to surgery and more recently was recovering from medical issues. She saw Dr. Romeo Apple recently and right knee warmth and effusion present with MRI obtained appearing consistent with active inflammatory arthritis. She is having a lot of pain and stiffness in her right knee and exacerbated with walking and weight bearing.   Previous HPI 11/06/21  Elizabeth Bautista is a 47 y.o. 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 She was last seen almost a year ago with discussion for considering addition of Enbrel due to continued pain and inflammation. This was initially delayed due to anticipating upcoming bariatric surgery. She saw Dr. Wynn Banker  for ongoing pain in multiple areas and felt to represent FMS and she started Lyrica with partial improvement in pain. She was hospitalized in February with severe asthma exacerbation including ICU stay. She had the surgery on 5/15 without major complication. She is off her medications since the surgery and has been struggling some with maintaining hydration status.   Previous HPI 12/25/2020 Elizabeth Bautista is a 47 y.o. 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 She does not feel the medication has controlled symptoms well she has persistent stiffness in her hands and knees in the  mornings and knee pain persists throughout the day. Her wrist has decreased in swelling. Her left shoulder is painful with decreased range of movement limited by pain. She has had asthma exacerbations treated with prednisone but currently trying to avoid this due to upcoming bariatric surgery plans.   Previous HPI: 10/11/20 Elizabeth Bautista is a 47 y.o. female here for evaluation and management of rheumatoid arthritis. Symptoms started at least since 2017 with hand, wrist, and knee pain and swelling periodically and worsened over time. She did not seek medication evaluation for some time due to being very busy with work and symptoms progressively increased. She was hospitalized for severe pain and swelling especially in the left wrist and knee and workup at that time was more extensive and revealed positive RA serology suspected as the cause. She saw rheumatology since 2020 and started treatment initial with prednisone that did not control symptoms well but caused a lot of weight gain. She has had chronic steroid exposure due to refractory asthma symptoms over the years. She also started hydroxychloroquine that apparently helped symptoms reasonably well initially but has worsened. She moved form [place] a year ago due to safety circumstances and has some family in the area and is continuing HCQ treatment but overall having a lot of joint pain. Currently feels swelling and heat in the joints multiple days per week and walks with either a cane or walker for her left knee pain and instability depending on how badly it is acting up. She was recently hospitalized for asthma exacerbation on 4/28 and discharged 5/2.   Labs reviewed 10/2018 RF ~30, CCP >250 ANA neg   Imaging reviewed Chest xray 08/2020 Cardiomegaly no airspace disease   Lumbar spine xray 08/2020 No acute findings   No Rheumatology ROS completed.   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   Mixed stress and urge urinary incontinence 08/14/2022   Encounter for screening fecal occult blood testing 08/14/2022   Encounter for well woman exam with routine gynecological exam 08/14/2022   Decreased libido 08/14/2022   Perimenopause 08/14/2022   Screening examination for STD (sexually transmitted disease) 08/14/2022   Otalgia of both ears 07/09/2022   Vitamin B1 deficiency 04/22/2022   Gastric ulcer 02/11/2022   Laryngopharyngeal reflux (LPR) 12/26/2021   History of gastric bypass 10/21/2021   Spinal stenosis of lumbar region with neurogenic claudication 08/27/2021   Fall 08/27/2021   Sinusitis 08/11/2021   Allergic rhinitis 08/07/2021   Hospital discharge follow-up 08/02/2021   (HFpEF) heart failure with preserved ejection fraction (HCC) 07/20/2021   Body mass index (BMI) 45.0-49.9, adult (HCC) 02/05/2021   Iron deficiency anemia due to chronic blood loss 10/17/2020   Tension-type headache, not intractable 10/17/2020  High risk medication use 10/11/2020   Acute hip pain, left 09/19/2020   Sciatica 09/11/2020   Dysmenorrhea 06/04/2020   Menorrhagia with irregular cycle 06/04/2020   Anxiety and depression 06/04/2020   Physical deconditioning 05/24/2020   Severe persistent asthma 03/08/2020   GERD (gastroesophageal reflux disease) 03/07/2020   Anxiety 03/07/2020   History of seizures 03/07/2020   Swelling of lower extremity 02/01/2020   Essential hypertension 02/01/2020   Vitamin D deficiency 02/01/2020   Type 2 diabetes mellitus with hyperglycemia, without long-term current use of insulin (HCC) 02/01/2020   Anemia 02/01/2020   Class 3 severe obesity with serious comorbidity and body mass index (BMI) of 45.0 to 49.9 in adult (HCC) 10/22/2019   OSA on CPAP 10/18/2019   Seropositive rheumatoid arthritis (HCC) 10/18/2019    Past Medical History:   Diagnosis Date   Acute respiratory failure with hypoxia (HCC)    Anemia    Asthma    COVID-19 virus infection 05/16/2020   04/27/20 dx covid-19, not hospitalized   Diabetes (HCC)    on Metformin   Dysplastic nevus 09/24/2022   R ant sole - severe, Excised 10/14/22   Eczema    Edema    Heart problem    "something with the arteries on the left side of the heart"; upcoming appt with cardiology for evaluation   HTN (hypertension)    Rheumatoid arthritis (HCC)    on Plaquenil    Seizures (HCC)    last seizure 2019   Sleep apnea    Sleep apnea     Family History  Problem Relation Age of Onset   High blood pressure Mother    Rheum arthritis Mother    Diabetes Father    Diabetes Sister    Heart Problems Sister    Diabetes Brother    Diabetes Paternal Grandmother    Diabetes Other        father's side "everybody died from Diabetes"   High blood pressure Other        mother's side, multiple siblings with this    Heart attack Other        family member on mother's side    Diabetes Paternal Aunt    Seizures Cousin        not sibings to the other cousins with seizures   Breast cancer Cousin    Seizures Cousin        not sibings to the other cousins with seizures   Seizures Cousin        not sibings to the other cousins with seizures   Cervical cancer Maternal Aunt    Breast cancer Cousin    Dementia Maternal Aunt    Asthma Maternal Aunt    Heart Problems Maternal Grandmother    Diabetes Brother    Asthma Daughter    Angioedema Daughter    Asthma Son    Past Surgical History:  Procedure Laterality Date   APPENDECTOMY     CESAREAN SECTION     x3   CHOLECYSTECTOMY  02/17/2022   FOOT SURGERY  2024   GASTRIC BYPASS     LAPAROSCOPIC GASTROTOMY W/ REPAIR OF ULCER  02/17/2022   TUBAL LIGATION  2003   Social History   Social History Narrative   Divorced.Lives with 3 kids.Originally from [place].Came from [place] ,[place] 7 months ago.      02/27/2020   Right handed    Caffeine: none     There is no immunization history on file for this patient.  Objective: Vital Signs: There were no vitals taken for this visit.   Physical Exam   Musculoskeletal Exam: ***  CDAI Exam: CDAI Score: -- Patient Global: --; Provider Global: -- Swollen: --; Tender: -- Joint Exam 02/12/2023   No joint exam has been documented for this visit   There is currently no information documented on the homunculus. Go to the Rheumatology activity and complete the homunculus joint exam.  Investigation: No additional findings.  Imaging: No results found.  Recent Labs: Lab Results  Component Value Date   WBC 4.1 11/07/2022   HGB 7.0 (L) 11/07/2022   PLT 286 11/07/2022   NA 139 11/07/2022   K 3.9 11/07/2022   CL 106 11/07/2022   CO2 27 11/07/2022   GLUCOSE 85 11/07/2022   BUN 8 11/07/2022   CREATININE 0.54 11/07/2022   BILITOT 0.3 11/07/2022   ALKPHOS 85 06/09/2022   AST 12 11/07/2022   ALT 9 11/07/2022   PROT 5.9 (L) 11/07/2022   ALBUMIN 3.2 (L) 06/09/2022   CALCIUM 8.3 (L) 11/07/2022   GFRAA 121 10/11/2020   QFTBGOLDPLUS NEGATIVE 01/15/2022    Speciality Comments: PLQ EYE EXAM 09/12/2021 [place] f/u 2-3 months Enbrel started 01/29/11  Procedures:  No procedures performed Allergies: Ferumoxytol, Phenytoin, Pecan nut (diagnostic), and Phenylbutazones   Assessment / Plan:     Visit Diagnoses: No diagnosis found.  ***  Orders: No orders of the defined types were placed in this encounter.  No orders of the defined types were placed in this encounter.    Follow-Up Instructions: No follow-ups on file.   Metta Clines, RT  Note - This record has been created using [employer].  Chart creation errors have been sought, but may not always  have been located. Such creation errors do not reflect on  the standard of medical care.

## 2023-02-12 ENCOUNTER — Ambulatory Visit: Payer: Medicare Other | Admitting: Internal Medicine

## 2023-02-12 DIAGNOSIS — M797 Fibromyalgia: Secondary | ICD-10-CM

## 2023-02-12 DIAGNOSIS — Z79899 Other long term (current) drug therapy: Secondary | ICD-10-CM

## 2023-02-12 DIAGNOSIS — G894 Chronic pain syndrome: Secondary | ICD-10-CM

## 2023-02-12 DIAGNOSIS — M059 Rheumatoid arthritis with rheumatoid factor, unspecified: Secondary | ICD-10-CM

## 2023-02-13 ENCOUNTER — Ambulatory Visit (INDEPENDENT_AMBULATORY_CARE_PROVIDER_SITE_OTHER): Payer: Medicare Other | Admitting: Pulmonary Disease

## 2023-02-13 ENCOUNTER — Encounter: Payer: Self-pay | Admitting: Pulmonary Disease

## 2023-02-13 VITALS — BP 110/70 | HR 50 | Temp 98.0°F | Ht 64.0 in | Wt 144.8 lb

## 2023-02-13 DIAGNOSIS — G4733 Obstructive sleep apnea (adult) (pediatric): Secondary | ICD-10-CM | POA: Diagnosis not present

## 2023-02-13 DIAGNOSIS — R634 Abnormal weight loss: Secondary | ICD-10-CM

## 2023-02-13 DIAGNOSIS — J4550 Severe persistent asthma, uncomplicated: Secondary | ICD-10-CM

## 2023-02-13 NOTE — Patient Instructions (Signed)
Will arrange for sleep study Will call to arrange for follow up after sleep study reviewed

## 2023-02-13 NOTE — Progress Notes (Signed)
[facility]  Chief Complaint  Patient presents with   Follow-up    Patient reports she has not been using CPAP. She reports snoring, restless sleep and fatigue.      Constitutional:  BP 110/70 (BP Location: Left Arm, Patient Position: Sitting, Cuff Size: Normal)   Pulse (!) 50   Temp 98 F (36.7 C) (Temporal)   Ht 5\' 4"  (1.626 m)   Wt 144 lb 12.8 oz (65.7 kg)   SpO2 100%   BMI 24.85 kg/m   Past Medical History:  Asthma, Anemia, Seizures, DM type 2, HTN, GERD, RA, COVID 19 infection November 2021, Iron deficiency anemia  Past Surgical History:  Her  has a past surgical history that includes Appendectomy; Cesarean section; Gastric bypass; Cholecystectomy (02/17/2022); Laparoscopic gastrotomy w/ repair of ulcer (02/17/2022); Tubal ligation (2003); and Foot surgery (2024).  Brief Summary:  Elizabeth Bautista is a 47 y.o. female former smoker with asthma, upper airway cough, laryngopharyngeal reflux, and obstructive sleep apnea.       Subjective:   She stopped using CPAP a while ago.  She felt better after she lost weight.  Her family says she is snoring again.  She is also feeling more tired during the day.  No longer on nucala.  This wasn't covered by insurance.  She is still using trelegy and singulair.    She was recently started on a new medicine for her RA.  Still gets winded with walking.  Not having cough, wheeze, or sputum.  Physical Exam:   Appearance - well kempt   ENMT - no sinus tenderness, no oral exudate, no LAN, Mallampati 3 airway, no stridor  Respiratory - equal breath sounds bilaterally, no wheezing or rales  CV - s1s2 regular rate and rhythm, no murmurs  Ext - no clubbing, no edema  Skin - no rashes  Psych - normal mood and affect       Pulmonary testing:  CBC 05/10/20 >> eosinophils 0.4 K/uL Allergy test 05/30/20 >> rye/bermuda/johnson grass, mold, hickory, dog, cat, cockroach A1AT 07/09/20  >> 147 RAST 07/09/20 >> dust mites, cats, dogs, cockroach, pecan hickory; IgE 884 Spirometry 03/07/22 >> FEV1 1.92 (79%), FEV1% 82  Chest Imaging:  CT sinus 10/20/19 >> normal CT angio chest 05/10/20 >> airway thickening, faint patchy GGO in lunges  Sleep Tests:  PSG 05/23/20 >> AHI 6.7, SpO2 low 83%.  REM AHI 32.4. Auto CPAP 08/07/21 to 08/25/21 >> used on 19 of 20 nights with average 4 hrs 13 min.  Average AHI 0.4 with median CPAP 10 and 95 th percentile CPAP 12 cm H2O  Cardiac Tests:  Echo 07/21/21 >> EF 60 to 65%, mod LVH, grade 1 DD  Social History:  She  reports that she quit smoking about 9 years ago. Her smoking use included cigarettes. She started smoking about 19 years ago. She has a 0.5 pack-year smoking history. She has never been exposed to tobacco smoke. She has never used smokeless tobacco. She reports that she does not currently use alcohol. She reports that she does not currently use drugs.  Family History:  Her family history includes Angioedema in her daughter; Asthma in her daughter, maternal aunt, and son; Breast cancer in her cousin and cousin; Cervical cancer in her maternal aunt; Dementia in her maternal aunt; Diabetes in her brother, brother, father, paternal aunt, paternal grandmother, sister, and another family member; Heart Problems in her maternal grandmother and sister; Heart attack in an other  family member; High blood pressure in her mother and another family member; Rheum arthritis in her mother; Seizures in her cousin, cousin, and cousin.     Assessment/Plan:   Severe, persistent allergic asthma. - breztri wasn't effective and yupelri is too expensive - nucala wasn't covered by insurance - continue trelegy 200 one puff daily - prn albuterol; she has a nebulizer device - she is followed by Dr. Dellis Anes also  Obstructive sleep apnea. -clinically improved initially after she lost weight - now having more snoring, apnea, sleep disruption and daytime  sleepiness - will arrange for a home sleep study to determine current status of her sleep apnea  Upper airway cough syndrome. - has perennial allergic rhinitis - followed by Dr. Dellis Anes - continue singulair, flonase, astelin, zyrtec  Seropositive rheumatoid arthritis. - followed by Dr. Sheliah Hatch with [facility] - recently started on orencia  Obesity. 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 Olin Pia with [facility]  Time Spent Involved in Patient Care on Day of Examination:  35 minutes  Follow up:   Patient Instructions  Will arrange for sleep study Will call to arrange for follow up after sleep study reviewed   Medication List:   Allergies as of 02/13/2023       Reactions   Ferumoxytol Anaphylaxis   ~5 years ago. Tolerated IV venofer   Phenytoin Anaphylaxis   Other reaction(s): swelling and heart rate decreased   Pecan Nut (diagnostic) Other (See Comments)   Phenylbutazones Other (See Comments)        Medication List        Accurate as of February 13, 2023  1:46 PM. If you have any questions, ask your nurse or doctor.          STOP taking these medications    budesonide 0.5 MG/2ML nebulizer solution Commonly known as: PULMICORT Stopped by: Ayren Zumbro   Nucala 100 MG/ML Soaj Generic drug: Mepolizumab Stopped by: Kern Reap 175 MCG/3ML nebulizer solution Generic drug: revefenacin Stopped by: Coralyn Helling       TAKE these medications    Aimovig 140 MG/ML Soaj Generic drug: Erenumab-aooe Inject 140 mg into the skin every 30 (thirty) days.   albuterol (2.5 MG/3ML) 0.083% nebulizer solution Commonly known as: PROVENTIL Take 3 mLs (2.5 mg total) by nebulization every 6 (six) hours as needed for wheezing or shortness of breath.   albuterol 108 (90 Base) MCG/ACT inhaler Commonly known as: ProAir HFA Inhale 2 puffs into the lungs every 6  (six) hours as needed for wheezing or shortness of breath. 2 puffs every 4 hours as needed only  if your can't catch your breath   amLODipine 5 MG tablet Commonly known as: NORVASC Take by mouth.   baclofen 10 MG tablet Commonly known as: LIORESAL TAKE 1 TABLET BY MOUTH AT BEDTIME AS NEEDED FOR MUSCLE SPASMS.   blood glucose meter kit and supplies Dispense based on patient and insurance preference. Use up to four times daily as directed. (FOR ICD-10 E10.9, E11.9).   busPIRone 7.5 MG tablet Commonly known as: BUSPAR Take 7.5 mg by mouth daily.   cetirizine 10 MG tablet Commonly known as: ZYRTEC TAKE 1 TABLET BY MOUTH EVERY DAY   colestipol 1 g tablet Commonly known as: COLESTID Take 2 g by mouth daily.   doxycycline 100 MG capsule Commonly known as: MONODOX Take 100 mg by mouth 2 (  two) times daily.   DULoxetine 60 MG capsule Commonly known as: CYMBALTA Take by mouth.   EPINEPHrine 0.3 mg/0.3 mL Soaj injection Commonly known as: EPI-PEN Inject 0.3 mg into the muscle as needed for anaphylaxis.   eszopiclone 1 MG Tabs tablet Commonly known as: Lunesta Take 1 tablet (1 mg total) by mouth at bedtime as needed for sleep. Take immediately before bedtime   famotidine 20 MG tablet Commonly known as: PEPCID Take 1 tablet (20 mg total) by mouth daily.   ferrous sulfate 325 (65 FE) MG tablet TAKE 1 TABLET BY MOUTH TWICE A DAY   Flowflex COVID-19 Ag Home Test Kit Generic drug: COVID-19 At Home Antigen Test See admin instructions.   furosemide 20 MG tablet Commonly known as: LASIX Take 20 mg by mouth daily.   hydrochlorothiazide 25 MG tablet Commonly known as: HYDRODIURIL Take by mouth.   HYDROcodone-acetaminophen 5-325 MG tablet Commonly known as: NORCO/VICODIN Take 1 tablet by mouth 2 (two) times daily as needed for moderate pain. 1-2 po q 4-6 hours prn pain, avoid driving, operating machinery while taking, may make drowsy   ipratropium-albuterol 0.5-2.5 (3) MG/3ML  Soln Commonly known as: DUONEB Take 3 mLs by nebulization every 6 (six) hours as needed.   meloxicam 7.5 MG tablet Commonly known as: MOBIC   metoCLOPramide 10 MG tablet Commonly known as: REGLAN Take by mouth.   metoprolol tartrate 50 MG tablet Commonly known as: LOPRESSOR Take by mouth.   mirtazapine 30 MG tablet Commonly known as: REMERON Take by mouth.   montelukast 10 MG tablet Commonly known as: SINGULAIR TAKE 1 TABLET BY MOUTH EVERYDAY AT BEDTIME   multivitamin tablet Take 1 tablet by mouth daily.   mupirocin ointment 2 % Commonly known as: BACTROBAN Apply 1 Application topically daily. Qd to excision site   omeprazole 40 MG capsule Commonly known as: PRILOSEC Take by mouth.   ondansetron 8 MG disintegrating tablet Commonly known as: ZOFRAN-ODT Take by mouth.   Orencia ClickJect 125 MG/ML Soaj Generic drug: Abatacept Inject 125 mg into the skin once a week.   pantoprazole 40 MG tablet Commonly known as: PROTONIX TAKE 1 TABLET (40 MG TOTAL) BY MOUTH DAILY. TAKE 30-60 MIN BEFORE FIRST MEAL OF THE DAY   potassium chloride SA 20 MEQ tablet Commonly known as: KLOR-CON M Take 1 tablet (20 mEq total) by mouth 2 (two) times daily.   pregabalin 200 MG capsule Commonly known as: LYRICA Take 1 capsule (200 mg total) by mouth 2 (two) times daily.   QUEtiapine 100 MG tablet Commonly known as: SEROQUEL   rizatriptan 10 MG disintegrating tablet Commonly known as: MAXALT-MLT Take 1 tablet earliest onset of migraine.  May repeat in 2 hours if needed. Maximum 2 tablets in 24 hours   Savella 12.5 MG Tabs Generic drug: Milnacipran HCl Take 1 tablet (12.5 mg total) by mouth at bedtime.   topiramate 25 MG tablet Commonly known as: TOPAMAX TAKE 2 TABLETS BY MOUTH AT BEDTIME.   Trelegy Ellipta 200-62.5-25 MCG/ACT Aepb Generic drug: Fluticasone-Umeclidin-Vilant Inhale 1 puff into the lungs daily.   tretinoin 0.025 % cream Commonly known as: RETIN-A Apply pea  sized amount to face qhs for acne   triamcinolone 0.025 % ointment Commonly known as: KENALOG Apply 1 Application topically 2 (two) times daily.   Ubrelvy 100 MG Tabs Generic drug: Ubrogepant Take 1 tablet (100 mg total) by mouth as needed. May repeat after 2 hours.  Maximum 2 tablets in 24 hours.   vitamin B-1  250 MG tablet Take by mouth.   vitamin C 1000 MG tablet Take 1,000 mg by mouth daily.   Vitamin D (Ergocalciferol) 1.25 MG (50000 UNIT) Caps capsule Commonly known as: DRISDOL   zonisamide 100 MG capsule Commonly known as: ZONEGRAN Take by mouth.        Signature:  Coralyn Helling, MD [facility] Pager - [phone] 02/13/2023, 1:46 PM

## 2023-02-20 ENCOUNTER — Encounter: Payer: Medicare Other | Admitting: Physical Medicine and Rehabilitation

## 2023-02-26 ENCOUNTER — Encounter (HOSPITAL_COMMUNITY): Payer: Self-pay | Admitting: Emergency Medicine

## 2023-02-26 ENCOUNTER — Emergency Department (HOSPITAL_COMMUNITY): Payer: Medicare Other

## 2023-02-26 ENCOUNTER — Other Ambulatory Visit: Payer: Self-pay

## 2023-02-26 ENCOUNTER — Emergency Department (HOSPITAL_COMMUNITY)
Admission: EM | Admit: 2023-02-26 | Discharge: 2023-02-26 | Discharge status: Against Medical Advice | Payer: Medicare Other | Attending: Emergency Medicine | Admitting: Emergency Medicine

## 2023-02-26 DIAGNOSIS — R42 Dizziness and giddiness: Secondary | ICD-10-CM | POA: Diagnosis not present

## 2023-02-26 DIAGNOSIS — R2241 Localized swelling, mass and lump, right lower limb: Secondary | ICD-10-CM | POA: Diagnosis not present

## 2023-02-26 DIAGNOSIS — R112 Nausea with vomiting, unspecified: Secondary | ICD-10-CM | POA: Diagnosis not present

## 2023-02-26 DIAGNOSIS — Z5321 Procedure and treatment not carried out due to patient leaving prior to being seen by health care provider: Secondary | ICD-10-CM | POA: Diagnosis not present

## 2023-02-26 DIAGNOSIS — R079 Chest pain, unspecified: Secondary | ICD-10-CM | POA: Diagnosis present

## 2023-02-26 NOTE — ED Provider Triage Note (Signed)
Emergency Medicine Provider Triage Evaluation Note  Jaquasha Jenessa Sieler , a 47 y.o. female  was evaluated in triage.  Pt complains of weeks of chest pain, dizziness, nausea and vomiting, states she feels worse today.  No sweating or had multiple episodes of syncope over the past couple of weeks.  She follows up with cardiology at [facility], he had an appointment 2 days ago and they are setting her up for cardiac monitor due to the syncope.  She reports her bright lower extremity swelling, no history of VTE  Review of Systems  Positive: Chest pain, shortness of breath, dizziness or nausea and vomiting Negative: Fever, sweating  Physical Exam  BP 105/76 (BP Location: Right Arm)   Pulse 85   Temp 98.3 F (36.8 C)   Resp 18   Ht 5\' 4"  (1.626 m)   Wt 60.8 kg   LMP 02/26/2023   SpO2 100%   BMI 23.00 kg/m  Gen:   Awake, no distress   Resp:  Normal effort  MSK:   Moves extremities without difficulty  Other:    Medical Decision Making  Medically screening exam initiated at 3:30 PM.  Appropriate orders placed.  Dianely Ahmyra Woodle was informed that the remainder of the evaluation will be completed by another provider, this initial triage assessment does not replace that evaluation, and the importance of remaining in the ED until their evaluation is complete.     Ma Rings, [place] 02/26/23 1533

## 2023-02-26 NOTE — ED Triage Notes (Signed)
Pt BIB EMS CP and weakness, with emesis, intermittently over last 2 weeks, had gallbladder removed in March 2024.

## 2023-02-27 ENCOUNTER — Other Ambulatory Visit (HOSPITAL_COMMUNITY): Payer: Self-pay

## 2023-02-27 ENCOUNTER — Other Ambulatory Visit: Payer: Self-pay | Admitting: Family Medicine

## 2023-02-27 DIAGNOSIS — R195 Other fecal abnormalities: Secondary | ICD-10-CM | POA: Insufficient documentation

## 2023-02-27 DIAGNOSIS — R55 Syncope and collapse: Secondary | ICD-10-CM | POA: Insufficient documentation

## 2023-02-27 DIAGNOSIS — E876 Hypokalemia: Secondary | ICD-10-CM | POA: Insufficient documentation

## 2023-03-03 ENCOUNTER — Other Ambulatory Visit: Payer: Self-pay

## 2023-03-03 ENCOUNTER — Other Ambulatory Visit: Payer: Self-pay | Admitting: Internal Medicine

## 2023-03-03 DIAGNOSIS — M059 Rheumatoid arthritis with rheumatoid factor, unspecified: Secondary | ICD-10-CM

## 2023-03-03 DIAGNOSIS — Z79899 Other long term (current) drug therapy: Secondary | ICD-10-CM

## 2023-03-03 NOTE — Progress Notes (Signed)
Specialty Pharmacy Refill Coordination Note  Elizabeth Bautista is a 47 y.o. female contacted today regarding refills of specialty medication(s) Abatacept   Patient requested Delivery   Delivery date: 03/10/23   Verified address: [address] [place], [address]   Medication will be filled on 03/09/2023.

## 2023-03-10 ENCOUNTER — Encounter
Payer: Medicare Other | Attending: Physical Medicine and Rehabilitation | Admitting: Physical Medicine and Rehabilitation

## 2023-03-10 ENCOUNTER — Telehealth: Payer: Self-pay | Admitting: Internal Medicine

## 2023-03-10 ENCOUNTER — Other Ambulatory Visit: Payer: Self-pay | Admitting: Internal Medicine

## 2023-03-10 ENCOUNTER — Other Ambulatory Visit: Payer: Self-pay

## 2023-03-10 DIAGNOSIS — Z79899 Other long term (current) drug therapy: Secondary | ICD-10-CM

## 2023-03-10 DIAGNOSIS — D649 Anemia, unspecified: Secondary | ICD-10-CM | POA: Diagnosis not present

## 2023-03-10 DIAGNOSIS — Z8639 Personal history of other endocrine, nutritional and metabolic disease: Secondary | ICD-10-CM

## 2023-03-10 DIAGNOSIS — M059 Rheumatoid arthritis with rheumatoid factor, unspecified: Secondary | ICD-10-CM

## 2023-03-10 DIAGNOSIS — M25572 Pain in left ankle and joints of left foot: Secondary | ICD-10-CM

## 2023-03-10 DIAGNOSIS — G8929 Other chronic pain: Secondary | ICD-10-CM

## 2023-03-10 DIAGNOSIS — R197 Diarrhea, unspecified: Secondary | ICD-10-CM

## 2023-03-10 DIAGNOSIS — G43801 Other migraine, not intractable, with status migrainosus: Secondary | ICD-10-CM | POA: Diagnosis not present

## 2023-03-10 DIAGNOSIS — M25571 Pain in right ankle and joints of right foot: Secondary | ICD-10-CM

## 2023-03-10 MED ORDER — VITAMIN D (ERGOCALCIFEROL) 1.25 MG (50000 UNIT) PO CAPS: 50000.0000 [IU] | ORAL_CAPSULE | ORAL | 0 refills | Status: AC

## 2023-03-10 MED ORDER — SCOPOLAMINE 1 MG/3DAYS TD PT72: 1.0000 | MEDICATED_PATCH | TRANSDERMAL | 12 refills | Status: DC

## 2023-03-10 MED ORDER — CYCLOBENZAPRINE HCL 5 MG PO TABS: 5.0000 mg | ORAL_TABLET | Freq: Three times a day (TID) | ORAL | 0 refills | Status: AC | PRN

## 2023-03-10 MED ORDER — ORENCIA CLICKJECT 125 MG/ML ~~LOC~~ SOAJ
125.0000 mg | SUBCUTANEOUS | 1 refills | Status: DC
  Filled 2023-03-10: qty 4, 28d supply, fill #0
  Filled 2023-04-01: qty 4, 28d supply, fill #1

## 2023-03-10 NOTE — Telephone Encounter (Signed)
Last Fill: 01/30/2023  Labs: 03/03/2023 RBC 3.12, Hgb 8.5, Hct 27.1, MCHC 31.4, RDW 51.9, AGAP 5, Creat. 0.46, Calcium 7.2  TB Gold: 01/15/2022 Neg    Next Visit: 04/21/2023  Last Visit: 11/07/2022  DX: Seropositive rheumatoid arthritis   Current Dose per office note 11/07/2022: Orencia 125mg   weekly.   Okay to refill Orencia?

## 2023-03-10 NOTE — Progress Notes (Signed)
Left voicemail for patient. - Refill was denied and patient needs an office visit.

## 2023-03-10 NOTE — Telephone Encounter (Signed)
Patient contacted the office to request a medication refill.   1. Name of Medication: Orencia  2. How are you currently taking this medication (dosage and times per day)? Once a week   3. What pharmacy would you like for that to be sent to? [facility]

## 2023-03-10 NOTE — Progress Notes (Signed)
Patient called back about previous voicemail message. Advised of denial and she will contact her provider office to schedule an appointment.

## 2023-03-10 NOTE — Telephone Encounter (Signed)
Returned call to patient. Advised patient the appointment is needed as she is on a high risk medication. Advised patient we have to assess if the medication is working and she how her joints are. Patient advised we will need to update her TB Gold when she comes for her appointment. Patient states she has been in a lot of pain.
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
## 2023-03-10 NOTE — Progress Notes (Signed)
Refill received. Mail 10/9

## 2023-03-10 NOTE — Telephone Encounter (Signed)
Pt called asking to speak with someone about her medication and why she needed to make an appointment with Dr. Dimple Casey. Pt states she feels the appointment is not needed. I did make an appointment with pt on 04/21/23 at 1:00pm.

## 2023-03-11 ENCOUNTER — Other Ambulatory Visit: Payer: Self-pay

## 2023-03-12 ENCOUNTER — Telehealth: Payer: Self-pay | Admitting: Physical Medicine and Rehabilitation

## 2023-03-12 ENCOUNTER — Other Ambulatory Visit: Payer: Self-pay | Admitting: Physical Medicine and Rehabilitation

## 2023-03-12 NOTE — Telephone Encounter (Signed)
The pharmacy told the patient they need a prior auth for her transderm-scop patch that Dr Carlis Abbott sent in yesterday

## 2023-03-22 ENCOUNTER — Ambulatory Visit (INDEPENDENT_AMBULATORY_CARE_PROVIDER_SITE_OTHER): Payer: Medicare Other | Admitting: Adult Health

## 2023-03-22 DIAGNOSIS — R634 Abnormal weight loss: Secondary | ICD-10-CM

## 2023-03-22 DIAGNOSIS — G4733 Obstructive sleep apnea (adult) (pediatric): Secondary | ICD-10-CM

## 2023-03-30 ENCOUNTER — Encounter: Payer: Self-pay | Admitting: Physical Medicine and Rehabilitation

## 2023-04-01 ENCOUNTER — Other Ambulatory Visit: Payer: Self-pay

## 2023-04-01 NOTE — Progress Notes (Signed)
Specialty Pharmacy Refill Coordination Note  Elizabeth Bautista is a 47 y.o. female contacted today regarding refills of specialty medication(s) Abatacept   Patient requested Delivery   Delivery date: 04/03/23   Verified address: [address], [place], [address]   Medication will be filled on 04/02/23.

## 2023-04-06 ENCOUNTER — Encounter: Payer: Medicare Other | Admitting: Orthopedic Surgery

## 2023-04-06 ENCOUNTER — Encounter
Payer: Medicare Other | Attending: Physical Medicine and Rehabilitation | Admitting: Physical Medicine and Rehabilitation

## 2023-04-06 DIAGNOSIS — G4700 Insomnia, unspecified: Secondary | ICD-10-CM

## 2023-04-06 DIAGNOSIS — Z8639 Personal history of other endocrine, nutritional and metabolic disease: Secondary | ICD-10-CM | POA: Insufficient documentation

## 2023-04-06 DIAGNOSIS — R197 Diarrhea, unspecified: Secondary | ICD-10-CM | POA: Insufficient documentation

## 2023-04-06 DIAGNOSIS — R110 Nausea: Secondary | ICD-10-CM | POA: Insufficient documentation

## 2023-04-06 DIAGNOSIS — G8929 Other chronic pain: Secondary | ICD-10-CM | POA: Diagnosis not present

## 2023-04-06 DIAGNOSIS — M25571 Pain in right ankle and joints of right foot: Secondary | ICD-10-CM | POA: Insufficient documentation

## 2023-04-06 DIAGNOSIS — R112 Nausea with vomiting, unspecified: Secondary | ICD-10-CM

## 2023-04-06 DIAGNOSIS — M797 Fibromyalgia: Secondary | ICD-10-CM | POA: Insufficient documentation

## 2023-04-06 DIAGNOSIS — G4701 Insomnia due to medical condition: Secondary | ICD-10-CM | POA: Insufficient documentation

## 2023-04-06 DIAGNOSIS — M5442 Lumbago with sciatica, left side: Secondary | ICD-10-CM | POA: Insufficient documentation

## 2023-04-06 DIAGNOSIS — M25572 Pain in left ankle and joints of left foot: Secondary | ICD-10-CM | POA: Insufficient documentation

## 2023-04-06 DIAGNOSIS — M48062 Spinal stenosis, lumbar region with neurogenic claudication: Secondary | ICD-10-CM | POA: Insufficient documentation

## 2023-04-06 DIAGNOSIS — M5441 Lumbago with sciatica, right side: Secondary | ICD-10-CM | POA: Insufficient documentation

## 2023-04-06 MED ORDER — FUROSEMIDE 20 MG PO TABS: 20.0000 mg | ORAL_TABLET | Freq: Every day | ORAL | 3 refills | Status: AC | PRN
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
## 2023-04-07 DIAGNOSIS — R0683 Snoring: Secondary | ICD-10-CM | POA: Diagnosis not present

## 2023-04-07 NOTE — Progress Notes (Deleted)
Office Visit Note  Patient: Elizabeth Bautista             Date of Birth: 1975-08-20           MRN: [id number]             PCP: Patient, No Pcp Per Referring: No ref. provider found Visit Date: 04/21/2023   Subjective:  No chief complaint on file.   History of Present Illness: Elizabeth Bautista is a 47 y.o. female here for follow up for seropositive RA currently off treatment she discontinued Enbrel treatment after she had new findings of dysplastic nevus with surgical excision on the bottom of the right foot and plan for follow-up excision of 1 on the left foot.    Previous HPI 11/07/2022 Elizabeth Bautista is a 47 y.o. female here for follow up for seropositive RA currently off treatment she discontinued Enbrel treatment after she had new findings of dysplastic nevus with surgical excision on the bottom of the right foot and plan for follow-up excision of 1 on the left foot.  She stopped taking the medicine for concern of this contributing to skin cancer risk.  Previously had no known personal or family history for cancer.  Since discontinuing the Enbrel she has increased pain and stiffness in multiple areas.  Starting to see additional swelling again in the right knee and right hand most frequently.     Previous HPI 08/05/22 Elizabeth Bautista is a 47 y.o. female here for follow up for seropositive RA on Enbrel 50 mg subcu weekly.  So far she feels the medication is improved joint pain and stiffness in most of her joints except she still having a lot of trouble with the persistent right knee pain and swelling as well as her chronic low back pain with sciatica.  She is using a knee brace and cane to offload the right knee due to pain with use.  She is trying to exercise including climbing stairs and walking but develops worsening knee pain afterwards that improves after several hours or a day of rest.  She is seeing Dr. Carlis Abbott for management of chronic pain with fibromyalgia with back  pain muscle pains and sciatica still giving her trouble especially at night.   Previous HPI 03/11/22 Elizabeth Bautista is a 47 y.o. female here for follow up for seropositive RA after starting Enbrel 50 mg Graham weekly  doses so far with 6th due today. So far she sees zero benefit with right knee pain and swelling is still severe. She has bruising and itching around each injection site no improvement trying to ice the area or taking benadryl for the itching.   Previous HPI 01/15/2022 Elizabeth Bautista is a 47 y.o. female here for follow up for seropositive RA on HCQ 400 mg daily. We had previously delayed addition of medication possible Enbrel due to surgery and more recently was recovering from medical issues. She saw Dr. Romeo Apple recently and right knee warmth and effusion present with MRI obtained appearing consistent with active inflammatory arthritis. She is having a lot of pain and stiffness in her right knee and exacerbated with walking and weight bearing.   Previous HPI 11/06/21  Elizabeth Bautista is a 47 y.o. 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 She was last seen almost a year ago with discussion for considering addition of Enbrel due to continued pain and inflammation. This was initially delayed due to anticipating upcoming bariatric  surgery. She saw Dr. Wynn Banker for ongoing pain in multiple areas and felt to represent FMS and she started Lyrica with partial improvement in pain. She was hospitalized in February with severe asthma exacerbation including ICU stay. She had the surgery on 5/15 without major complication. She is off her medications since the surgery and has been struggling some with maintaining hydration status.   Previous HPI 12/25/2020 Elizabeth Bautista is a 47 y.o. 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 She does not feel the medication has controlled symptoms well she has persistent stiffness in her hands  and knees in the mornings and knee pain persists throughout the day. Her wrist has decreased in swelling. Her left shoulder is painful with decreased range of movement limited by pain. She has had asthma exacerbations treated with prednisone but currently trying to avoid this due to upcoming bariatric surgery plans.   Previous HPI: 10/11/20 Elizabeth Bautista is a 47 y.o. female here for evaluation and management of rheumatoid arthritis. Symptoms started at least since 2017 with hand, wrist, and knee pain and swelling periodically and worsened over time. She did not seek medication evaluation for some time due to being very busy with work and symptoms progressively increased. She was hospitalized for severe pain and swelling especially in the left wrist and knee and workup at that time was more extensive and revealed positive RA serology suspected as the cause. She saw rheumatology since 2020 and started treatment initial with prednisone that did not control symptoms well but caused a lot of weight gain. She has had chronic steroid exposure due to refractory asthma symptoms over the years. She also started hydroxychloroquine that apparently helped symptoms reasonably well initially but has worsened. She moved form [place] a year ago due to safety circumstances and has some family in the area and is continuing HCQ treatment but overall having a lot of joint pain. Currently feels swelling and heat in the joints multiple days per week and walks with either a cane or walker for her left knee pain and instability depending on how badly it is acting up. She was recently hospitalized for asthma exacerbation on 4/28 and discharged 5/2.   Labs reviewed 10/2018 RF ~30, CCP >250 ANA neg   Imaging reviewed Chest xray 08/2020 Cardiomegaly no airspace disease   Lumbar spine xray 08/2020 No acute findings   No Rheumatology ROS completed.   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   Mixed stress and urge urinary incontinence 08/14/2022   Encounter for screening fecal occult blood testing 08/14/2022   Encounter for well woman exam with routine gynecological exam 08/14/2022   Decreased libido 08/14/2022   Perimenopause 08/14/2022   Screening examination for STD (sexually transmitted disease) 08/14/2022   Otalgia of both ears 07/09/2022   Vitamin B1 deficiency 04/22/2022   Gastric ulcer 02/11/2022   Laryngopharyngeal reflux (LPR) 12/26/2021   History of gastric bypass 10/21/2021   Spinal stenosis of lumbar region with neurogenic claudication 08/27/2021   Fall 08/27/2021   Sinusitis 08/11/2021   Allergic rhinitis 08/07/2021   Hospital discharge follow-up 08/02/2021   (HFpEF) heart failure with preserved ejection fraction (HCC) 07/20/2021   Body mass index (BMI) 45.0-49.9, adult (HCC) 02/05/2021   Iron deficiency anemia due to chronic blood loss 10/17/2020  Tension-type headache, not intractable 10/17/2020   High risk medication use 10/11/2020   Acute hip pain, left 09/19/2020   Sciatica 09/11/2020   Dysmenorrhea 06/04/2020   Menorrhagia with irregular cycle 06/04/2020   Anxiety and depression 06/04/2020   Physical deconditioning 05/24/2020   Severe persistent asthma 03/08/2020   GERD (gastroesophageal reflux disease) 03/07/2020   Anxiety 03/07/2020   History of seizures 03/07/2020   Swelling of lower extremity 02/01/2020   Essential hypertension 02/01/2020   Vitamin D deficiency 02/01/2020   Type 2 diabetes mellitus with hyperglycemia, without long-term current use of insulin (HCC) 02/01/2020   Anemia 02/01/2020   Class 3 severe obesity with serious comorbidity and body mass index (BMI) of 45.0 to 49.9 in adult (HCC) 10/22/2019   OSA on CPAP 10/18/2019   Seropositive rheumatoid arthritis (HCC) 10/18/2019    Past Medical  History:  Diagnosis Date   Acute respiratory failure with hypoxia (HCC)    Anemia    Asthma    COVID-19 virus infection 05/16/2020   04/27/20 dx covid-19, not hospitalized   Diabetes (HCC)    on Metformin   Dysplastic nevus 09/24/2022   R ant sole - severe, Excised 10/14/22   Eczema    Edema    Heart problem    "something with the arteries on the left side of the heart"; upcoming appt with cardiology for evaluation   HTN (hypertension)    Rheumatoid arthritis (HCC)    on Plaquenil    Seizures (HCC)    last seizure 2019   Sleep apnea    Sleep apnea     Family History  Problem Relation Age of Onset   High blood pressure Mother    Rheum arthritis Mother    Diabetes Father    Diabetes Sister    Heart Problems Sister    Diabetes Brother    Diabetes Paternal Grandmother    Diabetes Other        father's side "everybody died from Diabetes"   High blood pressure Other        mother's side, multiple siblings with this    Heart attack Other        family member on mother's side    Diabetes Paternal Aunt    Seizures Cousin        not sibings to the other cousins with seizures   Breast cancer Cousin    Seizures Cousin        not sibings to the other cousins with seizures   Seizures Cousin        not sibings to the other cousins with seizures   Cervical cancer Maternal Aunt    Breast cancer Cousin    Dementia Maternal Aunt    Asthma Maternal Aunt    Heart Problems Maternal Grandmother    Diabetes Brother    Asthma Daughter    Angioedema Daughter    Asthma Son    Past Surgical History:  Procedure Laterality Date   APPENDECTOMY     CESAREAN SECTION     x3   CHOLECYSTECTOMY  02/17/2022   FOOT SURGERY  2024   GASTRIC BYPASS     LAPAROSCOPIC GASTROTOMY W/ REPAIR OF ULCER  02/17/2022   TUBAL LIGATION  2003   Social History   Social History Narrative   Divorced.Lives with 3 kids.Originally from [place].Came from [place] ,[place] 7 months ago.      02/27/2020   Right  handed   Caffeine: none     There is no  immunization history on file for this patient.   Objective: Vital Signs: There were no vitals taken for this visit.   Physical Exam   Musculoskeletal Exam: ***  CDAI Exam: CDAI Score: -- Patient Global: --; Provider Global: -- Swollen: --; Tender: -- Joint Exam 04/21/2023   No joint exam has been documented for this visit   There is currently no information documented on the homunculus. Go to the Rheumatology activity and complete the homunculus joint exam.  Investigation: No additional findings.  Imaging: No results found.  Recent Labs: Lab Results  Component Value Date   WBC 4.1 11/07/2022   HGB 7.0 (L) 11/07/2022   PLT 286 11/07/2022   NA 139 11/07/2022   K 3.9 11/07/2022   CL 106 11/07/2022   CO2 27 11/07/2022   GLUCOSE 85 11/07/2022   BUN 8 11/07/2022   CREATININE 0.54 11/07/2022   BILITOT 0.3 11/07/2022   ALKPHOS 85 06/09/2022   AST 12 11/07/2022   ALT 9 11/07/2022   PROT 5.9 (L) 11/07/2022   ALBUMIN 3.2 (L) 06/09/2022   CALCIUM 8.3 (L) 11/07/2022   GFRAA 121 10/11/2020   QFTBGOLDPLUS NEGATIVE 01/15/2022    Speciality Comments: PLQ EYE EXAM 09/12/2021 [place] f/u 2-3 months Enbrel started 01/29/11  Procedures:  No procedures performed Allergies: Ferumoxytol, Phenytoin, Pecan nut (diagnostic), and Phenylbutazones   Assessment / Plan:     Visit Diagnoses: No diagnosis found.  ***  Orders: No orders of the defined types were placed in this encounter.  No orders of the defined types were placed in this encounter.    Follow-Up Instructions: No follow-ups on file.   Metta Clines, RT  Note - This record has been created using [employer].  Chart creation errors have been sought, but may not always  have been located. Such creation errors do not reflect on  the standard of medical care.

## 2023-04-08 DIAGNOSIS — Z9851 Tubal ligation status: Secondary | ICD-10-CM | POA: Insufficient documentation

## 2023-04-09 ENCOUNTER — Other Ambulatory Visit: Payer: Self-pay

## 2023-04-09 ENCOUNTER — Encounter: Payer: Self-pay | Admitting: Orthopedic Surgery

## 2023-04-09 ENCOUNTER — Ambulatory Visit: Payer: Medicare Other | Admitting: Orthopedic Surgery

## 2023-04-09 ENCOUNTER — Other Ambulatory Visit (INDEPENDENT_AMBULATORY_CARE_PROVIDER_SITE_OTHER): Payer: Self-pay

## 2023-04-09 VITALS — BP 142/92 | HR 80 | Ht 65.0 in | Wt 140.0 lb

## 2023-04-09 DIAGNOSIS — M25571 Pain in right ankle and joints of right foot: Secondary | ICD-10-CM | POA: Diagnosis not present

## 2023-04-09 DIAGNOSIS — G8929 Other chronic pain: Secondary | ICD-10-CM | POA: Diagnosis not present

## 2023-04-09 DIAGNOSIS — M05771 Rheumatoid arthritis with rheumatoid factor of right ankle and foot without organ or systems involvement: Secondary | ICD-10-CM

## 2023-04-09 DIAGNOSIS — M25572 Pain in left ankle and joints of left foot: Secondary | ICD-10-CM | POA: Diagnosis not present

## 2023-04-09 DIAGNOSIS — M05772 Rheumatoid arthritis with rheumatoid factor of left ankle and foot without organ or systems involvement: Secondary | ICD-10-CM | POA: Diagnosis not present

## 2023-04-09 NOTE — Progress Notes (Signed)
Chief Complaint  Patient presents with   Foot Pain    Both    Ankle Pain    Both     47 year old female with rheumatoid arthritis appointment with rheumatology coming up presents with bilateral ankle swelling without history of trauma this is causing bilateral foot and ankle pain loss of motion  On exam she has swelling of both ankle joints which goes into her feet she has tenderness and warmth around the joint themselves.  The ankle joints remain stable  The imaging studies show  Plan and assessment  Rheumatoid arthritis affecting the ankles with effusions  Recommend follow-up as scheduled with rheumatology as this does not require surgery but most likely a medication adjustment  Encounter Diagnoses  Name Primary?   Chronic ankle pain, bilateral Yes   Rheumatoid arthritis involving both ankles with positive rheumatoid factor (HCC)     Unfortunately I cannot do any type of ankle aspiration or injection  Current Outpatient Medications:    Abatacept (ORENCIA CLICKJECT) 125 MG/ML SOAJ, Inject 125 mg into the skin once a week., Disp: 4 mL, Rfl: 1   Erenumab-aooe (AIMOVIG) 140 MG/ML SOAJ, Inject 140 mg into the skin every 30 (thirty) days., Disp: 1.12 mL, Rfl: 11   meloxicam (MOBIC) 7.5 MG tablet, , Disp: , Rfl:      Milnacipran HCl (SAVELLA) 12.5 MG TABS, Take 1 tablet (12.5 mg total) by mouth at bedtime., Disp: 90 tablet, Rfl: 3   mirtazapine (REMERON) 30 MG tablet, Take by mouth., Disp: , Rfl:     pantoprazole (PROTONIX) 40 MG tablet, TAKE 1 TABLET (40 MG TOTAL) BY MOUTH DAILY. TAKE 30-60 MIN BEFORE FIRST MEAL OF THE DAY, Disp: 90 tablet, Rfl: 0     tretinoin (RETIN-A) 0.025 % cream, Apply pea sized amount to face qhs for acne, Disp: 45 g, Rfl: 11   triamcinolone (KENALOG) 0.025 % ointment, Apply 1 Application topically 2 (two) times daily., Disp: 30 g, Rfl: 2   Ubrogepant (UBRELVY) 100 MG TABS, Take 1 tablet (100 mg total) by mouth as needed. May repeat after 2 hours.  Maximum 2  tablets in 24 hours., Disp: 10 tablet, Rfl: 11   zonisamide (ZONEGRAN) 100 MG capsule, Take by mouth., Disp: , Rfl:

## 2023-04-13 ENCOUNTER — Encounter: Payer: Self-pay | Admitting: Physical Medicine and Rehabilitation

## 2023-04-13 ENCOUNTER — Encounter (HOSPITAL_BASED_OUTPATIENT_CLINIC_OR_DEPARTMENT_OTHER): Payer: Medicare Other | Admitting: Physical Medicine and Rehabilitation

## 2023-04-13 VITALS — BP 133/89 | HR 75 | Ht 65.0 in | Wt 142.0 lb

## 2023-04-13 DIAGNOSIS — R197 Diarrhea, unspecified: Secondary | ICD-10-CM | POA: Diagnosis present

## 2023-04-13 DIAGNOSIS — Z8639 Personal history of other endocrine, nutritional and metabolic disease: Secondary | ICD-10-CM | POA: Diagnosis not present

## 2023-04-13 DIAGNOSIS — M797 Fibromyalgia: Secondary | ICD-10-CM

## 2023-04-13 DIAGNOSIS — M5442 Lumbago with sciatica, left side: Secondary | ICD-10-CM

## 2023-04-13 DIAGNOSIS — M5441 Lumbago with sciatica, right side: Secondary | ICD-10-CM

## 2023-04-13 DIAGNOSIS — G8929 Other chronic pain: Secondary | ICD-10-CM | POA: Diagnosis present

## 2023-04-13 DIAGNOSIS — G4701 Insomnia due to medical condition: Secondary | ICD-10-CM | POA: Diagnosis present

## 2023-04-13 DIAGNOSIS — M25571 Pain in right ankle and joints of right foot: Secondary | ICD-10-CM | POA: Diagnosis present

## 2023-04-13 DIAGNOSIS — M25572 Pain in left ankle and joints of left foot: Secondary | ICD-10-CM | POA: Diagnosis present

## 2023-04-13 DIAGNOSIS — R110 Nausea: Secondary | ICD-10-CM | POA: Diagnosis present

## 2023-04-13 DIAGNOSIS — M48062 Spinal stenosis, lumbar region with neurogenic claudication: Secondary | ICD-10-CM | POA: Diagnosis present

## 2023-04-13 MED ORDER — BELBUCA 75 MCG BU FILM: 75.0000 ug | ORAL_FILM | Freq: Two times a day (BID) | BUCCAL | 3 refills | Status: DC
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
## 2023-04-13 NOTE — Patient Instructions (Signed)
Insomnia: -Try to go outside near sunrise -Get exercise during the day.  -Turn off all devices an hour before bedtime.  -Teas that can benefit: chamomile, valerian root, Brahmi (Bacopa) -Can consider over the counter melatonin, magnesium, and/or L-theanine. Melatonin is an anti-oxidant with multiple health benefits. Magnesium is involved in greater than 300 enzymatic reactions in the body and most of us are deficient as our soil is often depleted. There are 7 different types of magnesium- Bioptemizer's is a supplement with all 7 types, and each has unique benefits. Magnesium can also help with constipation and anxiety.  -Pistachios naturally increase the production of melatonin -Cozy Earth bamboo bed sheets are free from toxic chemicals.  -Tart cherry juice or a tart cherry supplement can improve sleep and soreness post-workout   

## 2023-04-14 ENCOUNTER — Encounter: Payer: Self-pay | Admitting: Physical Medicine and Rehabilitation

## 2023-04-15 ENCOUNTER — Other Ambulatory Visit: Payer: Self-pay | Admitting: Physical Medicine and Rehabilitation

## 2023-04-15 DIAGNOSIS — G8929 Other chronic pain: Secondary | ICD-10-CM

## 2023-04-21 ENCOUNTER — Other Ambulatory Visit: Payer: Self-pay

## 2023-04-21 ENCOUNTER — Encounter (HOSPITAL_BASED_OUTPATIENT_CLINIC_OR_DEPARTMENT_OTHER): Payer: Medicare Other | Admitting: Physical Medicine and Rehabilitation

## 2023-04-21 ENCOUNTER — Ambulatory Visit: Payer: Medicare Other | Admitting: Internal Medicine

## 2023-04-21 ENCOUNTER — Other Ambulatory Visit: Payer: Self-pay | Admitting: Internal Medicine

## 2023-04-21 ENCOUNTER — Other Ambulatory Visit (HOSPITAL_COMMUNITY): Payer: Self-pay

## 2023-04-21 DIAGNOSIS — M059 Rheumatoid arthritis with rheumatoid factor, unspecified: Secondary | ICD-10-CM

## 2023-04-21 DIAGNOSIS — G894 Chronic pain syndrome: Secondary | ICD-10-CM

## 2023-04-21 DIAGNOSIS — Z79899 Other long term (current) drug therapy: Secondary | ICD-10-CM

## 2023-04-21 DIAGNOSIS — M797 Fibromyalgia: Secondary | ICD-10-CM | POA: Diagnosis not present

## 2023-04-21 MED ORDER — OXYCODONE HCL 5 MG PO TABS: 5.0000 mg | ORAL_TABLET | Freq: Three times a day (TID) | ORAL | 0 refills | Status: DC | PRN

## 2023-04-21 NOTE — Progress Notes (Signed)
04/21/2023- Refill denied reason: patient has request refill too soon. Patient also request Sharps container & Per Sandi we can only fill them as Rxs now, so they would be charged. Price is around $5. If patient wants a no Corporate investment banker patient has to reach out to manufacturer for a kit

## 2023-04-21 NOTE — Progress Notes (Signed)
Specialty Pharmacy Refill Coordination Note  Elizabeth Bautista is a 47 y.o. female contacted today regarding refills of specialty medication(s) Abatacept   Patient requested Delivery   Delivery date: 04/29/23   Verified address: [address], [place], [address]   Medication will be filled on 04/28/23.
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
## 2023-04-21 NOTE — Progress Notes (Signed)
Specialty Pharmacy Ongoing Clinical Assessment Note  [place] is a 47 y.o. female who is being followed by the specialty pharmacy service for RxSp Rheumatoid Arthritis   Patient's specialty medication(s) reviewed today: Abatacept   Missed doses in the last 4 weeks: 0   Patient/Caregiver did not have any additional questions or concerns.   Therapeutic benefit summary: Patient is achieving benefit   Adverse events/side effects summary: No adverse events/side effects   Patient's therapy is appropriate to: Continue    Goals Addressed             This Visit's Progress    Reduce signs and symptoms       Patient is not on track and no change. Patient will maintain adherence and be monitored by provider to determine if a change in treatment plan is warranted. Patient has had benefit from [facility] but is still experiencing pain. She has an upcoming visit with provider to assess her treatment regimen.          Follow up:  6 months  Otto Herb Specialty Pharmacist

## 2023-04-22 ENCOUNTER — Other Ambulatory Visit: Payer: Self-pay | Admitting: *Deleted

## 2023-04-22 DIAGNOSIS — Z79899 Other long term (current) drug therapy: Secondary | ICD-10-CM

## 2023-04-22 DIAGNOSIS — M059 Rheumatoid arthritis with rheumatoid factor, unspecified: Secondary | ICD-10-CM

## 2023-04-22 NOTE — Telephone Encounter (Signed)
Last Fill: 03/10/2023  Labs: 03/03/2023 AGAP 5, Creat. 0.46, Calcium 7.2, RBC 3.12, Hgb 8.5, Hct 27.1, MCHC 31.4, RDW SD 51.9  TB Gold: 01/15/2022 Neg    Next Visit: Due November 2024. Appt. Cancelled due to computers being down. Front working to reschedule.   Last Visit: 11/07/2022  GM:[id number] rheumatoid arthritis   Current Dose per office note 11/07/2022: Orencia 125mg  [place]ill weekly.   Okay to refill Orencia?

## 2023-04-23 ENCOUNTER — Other Ambulatory Visit (HOSPITAL_COMMUNITY): Payer: Self-pay

## 2023-04-23 MED ORDER — ORENCIA CLICKJECT 125 MG/ML ~~LOC~~ SOAJ
125.0000 mg | SUBCUTANEOUS | 0 refills | Status: DC
  Filled 2023-04-23: qty 4, 28d supply, fill #0

## 2023-04-27 NOTE — Progress Notes (Signed)
Called and spoke with patient, advised of results/recommendations per Rubye Oaks NP.  She verbalized understanding.  Nothing further needed.

## 2023-04-28 ENCOUNTER — Other Ambulatory Visit: Payer: Self-pay

## 2023-04-29 ENCOUNTER — Ambulatory Visit: Payer: Medicare Other | Attending: Internal Medicine | Admitting: Internal Medicine

## 2023-04-29 ENCOUNTER — Encounter: Payer: Self-pay | Admitting: Internal Medicine

## 2023-04-29 VITALS — BP 124/81 | HR 59 | Resp 14 | Ht 64.0 in | Wt 145.0 lb

## 2023-04-29 DIAGNOSIS — M059 Rheumatoid arthritis with rheumatoid factor, unspecified: Secondary | ICD-10-CM | POA: Diagnosis not present

## 2023-04-29 DIAGNOSIS — Z79899 Other long term (current) drug therapy: Secondary | ICD-10-CM | POA: Diagnosis present

## 2023-04-29 DIAGNOSIS — M25572 Pain in left ankle and joints of left foot: Secondary | ICD-10-CM | POA: Diagnosis not present

## 2023-04-29 DIAGNOSIS — M25571 Pain in right ankle and joints of right foot: Secondary | ICD-10-CM | POA: Diagnosis present

## 2023-04-29 NOTE — Progress Notes (Signed)
Office Visit Note  Patient: Elizabeth Bautista             Date of Birth: 07/15/1975           MRN: [id number]             PCP: Tarri Glenn, PA-C Referring: No ref. provider found Visit Date: 04/29/2023   Subjective:  Follow-up (Patient states she has pain in her ankles that goes to the bottom of both feet. )    Discussed the use of AI scribe software for clinical note transcription with the patient, who gave verbal consent to proceed.  History of Present Illness   Elizabeth Bautista is a 47 y.o. female with seropositive rheumatoid arthritis on Orencia 125 mg [place] weekly here due to increased joint pain, particularly in the ankles, over the past month. The pain is described as so severe that it impedes the ability to walk, with some days being completely incapacitating. The patient also reports several falls due to the pain. The pain radiates from the ankles to the underside of the foot and is accompanied by swelling. The patient notes that the right ankle has sometimes been more inflamed than the other, although both are painful.  Pain management has recently switched the patient to oxycodone, which provides temporary relief but does not alleviate the persistent daily pain. Physical therapy has been attempted but was deemed ineffective due to the patient's inability to walk or move the ankles. The patient expressed a preference for aqua therapy, which previously provided more relief, but has been unable to continue due to ongoing diarrhea issues since bariatric surgery  In addition to the ankle pain, the patient reports increased swelling and pain in the right hand, to the point of being unable to close it. The patient continues to take Orencia shots for rheumatoid arthritis management, which were effective prior to the recent increase in pain.  The patient also reports ongoing gastric issues, including persistent diarrhea and vomiting, since a recent surgery. These issues have been  associated with malabsorption, leading to nutritional deficiencies and hair loss. The patient was hospitalized last month due to these issues, and lab results indicated low levels of certain nutrients.   Previous HPI 11/07/22 Elizabeth Bautista is a 47 y.o. female here for follow up for seropositive RA currently off treatment she discontinued Enbrel treatment after she had new findings of dysplastic nevus with surgical excision on the bottom of the right foot and plan for follow-up excision of 1 on the left foot.  She stopped taking the medicine for concern of this contributing to skin cancer risk.  Previously had no known personal or family history for cancer.  Since discontinuing the Enbrel she has increased pain and stiffness in multiple areas.  Starting to see additional swelling again in the right knee and right hand most frequently.   Previous HPI: 10/11/20 Elizabeth Bautista is a 47 y.o. female here for evaluation and management of rheumatoid arthritis. Symptoms started at least since 2017 with hand, wrist, and knee pain and swelling periodically and worsened over time. She did not seek medication evaluation for some time due to being very busy with work and symptoms progressively increased. She was hospitalized for severe pain and swelling especially in the left wrist and knee and workup at that time was more extensive and revealed positive RA serology suspected as the cause. She saw rheumatology since 2020 and started treatment initial with prednisone that did not control symptoms well  but caused a lot of weight gain. She has had chronic steroid exposure due to refractory asthma symptoms over the years. She also started hydroxychloroquine that apparently helped symptoms reasonably well initially but has worsened. She moved form [place] a year ago due to safety circumstances and has some family in the area and is continuing HCQ treatment but overall having a lot of joint pain. Currently feels swelling and  heat in the joints multiple days per week and walks with either a cane or walker for her left knee pain and instability depending on how badly it is acting up. She was recently hospitalized for asthma exacerbation on 4/28 and discharged 5/2.   Labs reviewed 10/2018 RF ~30, CCP >250 ANA neg   Imaging reviewed Chest xray 08/2020 Cardiomegaly no airspace disease   Lumbar spine xray 08/2020 No acute findings   Review of Systems  Constitutional:  Positive for fatigue.  HENT:  Positive for mouth dryness. Negative for mouth sores.   Eyes:  Positive for dryness.  Respiratory:  Positive for shortness of breath.   Cardiovascular:  Positive for chest pain and palpitations.  Gastrointestinal:  Positive for diarrhea. Negative for blood in stool and constipation.  Endocrine: Negative for increased urination.  Genitourinary:  Positive for involuntary urination.  Musculoskeletal:  Positive for joint pain, gait problem, joint pain, joint swelling, myalgias, muscle weakness, morning stiffness, muscle tenderness and myalgias.  Skin:  Positive for rash, hair loss and sensitivity to sunlight. Negative for color change.  Allergic/Immunologic: Positive for susceptible to infections.  Neurological:  Positive for dizziness and headaches.  Hematological:  Negative for swollen glands.  Psychiatric/Behavioral:  Positive for depressed mood and sleep disturbance. The patient is nervous/anxious.     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   Mixed stress and urge urinary incontinence 08/14/2022   Encounter for screening fecal occult blood testing 08/14/2022   Encounter for well woman exam with routine gynecological exam 08/14/2022   Decreased libido 08/14/2022   Perimenopause 08/14/2022   Screening examination for STD (sexually transmitted disease) 08/14/2022   Otalgia of both ears 07/09/2022   Vitamin B1 deficiency 04/22/2022   Gastric ulcer 02/11/2022   Laryngopharyngeal reflux (LPR) 12/26/2021   History of gastric bypass 10/21/2021   Spinal stenosis of lumbar region with neurogenic claudication 08/27/2021   Fall 08/27/2021   Sinusitis 08/11/2021   Allergic rhinitis 08/07/2021   Hospital discharge follow-up 08/02/2021   (HFpEF) heart failure with preserved ejection fraction (HCC) 07/20/2021   Body mass index (BMI) 45.0-49.9, adult (HCC) 02/05/2021   Iron deficiency anemia due to chronic blood loss 10/17/2020   Tension-type headache, not intractable 10/17/2020   High risk medication use 10/11/2020   Acute hip pain, left 09/19/2020   Sciatica 09/11/2020   Dysmenorrhea 06/04/2020   Menorrhagia with irregular cycle 06/04/2020   Anxiety and depression 06/04/2020   Physical deconditioning 05/24/2020   Severe persistent asthma 03/08/2020   GERD (gastroesophageal reflux disease) 03/07/2020   Anxiety 03/07/2020   History of seizures 03/07/2020   Swelling of  lower extremity 02/01/2020   Essential hypertension 02/01/2020   Vitamin D deficiency 02/01/2020   Type 2 diabetes mellitus with hyperglycemia, without long-term current use of insulin (HCC) 02/01/2020   Anemia 02/01/2020   Class 3 severe obesity with serious comorbidity and body mass index (BMI) of 45.0 to 49.9 in adult (HCC) 10/22/2019   OSA on CPAP 10/18/2019   Seropositive rheumatoid arthritis (HCC) 10/18/2019    Past Medical History:  Diagnosis Date   Acute respiratory failure with hypoxia (HCC)    Anemia    Asthma    COVID-19 virus infection  05/16/2020   04/27/20 dx covid-19, not hospitalized   Diabetes (HCC)    on Metformin   Dysplastic nevus 09/24/2022   R ant sole - severe, Excised 10/14/22   Eczema    Edema    Heart problem    "something with the arteries on the left side of the heart"; upcoming appt with cardiology for evaluation   HTN (hypertension)    Rheumatoid arthritis (HCC)    on Plaquenil    Seizures (HCC)    last seizure 2019   Sleep apnea    Sleep apnea     Family History  Problem Relation Age of Onset   High blood pressure Mother    Rheum arthritis Mother    Diabetes Father    Diabetes Sister    Heart Problems Sister    Diabetes Brother    Diabetes Paternal Grandmother    Diabetes Other        father's side "everybody died from Diabetes"   High blood pressure Other        mother's side, multiple siblings with this    Heart attack Other        family member on mother's side    Diabetes Paternal Aunt    Seizures Cousin        not sibings to the other cousins with seizures   Breast cancer Cousin    Seizures Cousin        not sibings to the other cousins with seizures   Seizures Cousin        not sibings to the other cousins with seizures   Cervical cancer Maternal Aunt    Breast cancer Cousin    Dementia Maternal Aunt    Asthma Maternal Aunt    Heart Problems Maternal Grandmother    Diabetes Brother    Asthma Daughter    Angioedema Daughter    Asthma Son    Past Surgical History:  Procedure Laterality Date   APPENDECTOMY     CESAREAN SECTION     x3   CHOLECYSTECTOMY  02/17/2022   FOOT SURGERY  2024   GASTRIC BYPASS     LAPAROSCOPIC GASTROTOMY W/ REPAIR OF ULCER  02/17/2022   TUBAL LIGATION  2003   Social History   Social History Narrative   Divorced.Lives with 3 kids.Originally from [place].Came from [place] ,[place] 7 months ago.      02/27/2020   Right handed   Caffeine: none     There is no immunization history on file for this patient.   Objective: Vital Signs: BP 124/81  (BP Location: Left Arm, Patient Position: Sitting, Cuff Size: Normal)   Pulse (!) 59   Resp 14   Ht 5\' 4"  (1.626 m)   Wt 145 lb (65.8 kg)   LMP 04/04/2023   BMI 24.89 kg/m    Physical Exam Constitutional:      Comments: Ambulatory with cane  Eyes:     Conjunctiva/sclera: Conjunctivae normal.  Cardiovascular:     Rate and Rhythm: Normal rate and regular rhythm.  Pulmonary:     Effort: Pulmonary effort is normal.     Breath sounds: Normal breath sounds.  Lymphadenopathy:     Cervical: No cervical adenopathy.  Skin:    General: Skin is warm and dry.  Neurological:     Mental Status: She is alert.  Psychiatric:        Mood and Affect: Mood normal.      Musculoskeletal Exam:  Shoulders full ROM no tenderness or swelling Elbows full ROM no tenderness or swelling Wrists full ROM no tenderness or swelling Fingers full ROM right MCP joints tender to pressure without palpable swelling Knees full ROM no tenderness or swelling Ankles are tender to pressure on both sides, all around and with flexion/extension and with inversion/eversion, no palpable effusion Pain with pressure on sole of foot at anterior border of calcaneus MTPs full ROM, left 1st MTP tenderness to pressure   Investigation: No additional findings.  Imaging: DG Ankle Complete Left  Result Date: 04/10/2023 X-ray report Chief complaint pain ankle Images 3 v ankle Reading: normal joint and bone Impression: no abn seen   DG Ankle Complete Right  Result Date: 04/10/2023 X-ray report Chief complaint pain ankle Images 3 v ankle Reading: normal joint and bone Impression: no abn seen    Recent Labs: Lab Results  Component Value Date   WBC 4.1 11/07/2022   HGB 7.0 (L) 11/07/2022   PLT 286 11/07/2022   NA 139 11/07/2022   K 3.9 11/07/2022   CL 106 11/07/2022   CO2 27 11/07/2022   GLUCOSE 85 11/07/2022   BUN 8 11/07/2022   CREATININE 0.54 11/07/2022   BILITOT 0.3 11/07/2022   ALKPHOS 85 06/09/2022   AST 12  11/07/2022   ALT 9 11/07/2022   PROT 5.9 (L) 11/07/2022   ALBUMIN 3.2 (L) 06/09/2022   CALCIUM 8.3 (L) 11/07/2022   GFRAA 121 10/11/2020   QFTBGOLDPLUS NEGATIVE 01/15/2022    Speciality Comments: PLQ EYE EXAM 09/12/2021 [place] f/u 2-3 months Enbrel started 01/29/11  Procedures:  No procedures performed Allergies: Ferumoxytol, Phenytoin, Pecan nut (diagnostic), and Phenylbutazones   Assessment / Plan:     Visit Diagnoses: Seropositive rheumatoid arthritis (HCC) - Plan: Sedimentation rate, C-reactive protein High risk medication use - Plan: QuantiFERON-TB Gold Plus  Rheumatoid Arthritis Increased joint pain and inflammation in ankles and right hand for over a month. Pain severe enough to cause falls and difficulty walking. Current treatment with Orencia and pain management with Oxycodone provides temporary relief but pain persists. Patient prefers aqua therapy over physical therapy due to less post-therapy pain, but has been unable to continue due to ongoing diarrhea. -Order ESR and CRP for inflammatory disease assessment -Review previous medication trials to determine potential options for additional treatment. -Consider adding low-dose oral medication methotrexate or injectable route to minimize GI impact -Difficult to manage acute flare patient declines injection, NSAIDs and GCs instructed against per bariatric surgery clinic recs  Chronic Diarrhea Ongoing issue since bariatric surgery, causing malabsorption and hair loss. -Continue follow-up with bariatric clinic for nutritional monitoring. -Consider impact of diarrhea on absorption of any new oral medications.    Orders: Orders Placed This Encounter  Procedures   Sedimentation rate   C-reactive protein   QuantiFERON-TB Gold Plus   No orders of the defined types were placed in this encounter.    Follow-Up Instructions: No follow-ups on  file.   Fuller Plan, MD  Note - This record has been created using Dragon  software.  Chart creation errors have been sought, but may not always  have been located. Such creation errors do not reflect on  the standard of medical care.

## 2023-05-02 LAB — QUANTIFERON-TB GOLD PLUS
Mitogen-NIL: 7.21 [IU]/mL
NIL: 0.02 [IU]/mL
QuantiFERON-TB Gold Plus: NEGATIVE
TB1-NIL: 0.02 [IU]/mL
TB2-NIL: 0.01 [IU]/mL

## 2023-05-02 LAB — SEDIMENTATION RATE: Sed Rate: 14 mm/h (ref 0–20)

## 2023-05-02 LAB — C-REACTIVE PROTEIN: CRP: <3 mg/L (ref <8.0)

## 2023-05-06 ENCOUNTER — Encounter: Payer: Self-pay | Admitting: Dermatology

## 2023-05-06 ENCOUNTER — Ambulatory Visit: Payer: Medicare Other | Admitting: Dermatology

## 2023-05-06 DIAGNOSIS — Q825 Congenital non-neoplastic nevus: Secondary | ICD-10-CM

## 2023-05-06 DIAGNOSIS — L910 Hypertrophic scar: Secondary | ICD-10-CM

## 2023-05-06 DIAGNOSIS — R208 Other disturbances of skin sensation: Secondary | ICD-10-CM | POA: Diagnosis not present

## 2023-05-06 DIAGNOSIS — Z86018 Personal history of other benign neoplasm: Secondary | ICD-10-CM

## 2023-05-06 DIAGNOSIS — Z7189 Other specified counseling: Secondary | ICD-10-CM

## 2023-05-06 DIAGNOSIS — D229 Melanocytic nevi, unspecified: Secondary | ICD-10-CM

## 2023-05-06 NOTE — Patient Instructions (Signed)

## 2023-05-06 NOTE — Progress Notes (Signed)
   Follow-Up Visit   Subjective  Elizabeth Bautista is a 47 y.o. female who presents for the following: Keloid, abdomen, painful, IL injections Kenalog 09/24/22, hx of Dysplastic Nevus R ant sole, excised 10/14/22 pt said area is still painful The patient has spots, moles and lesions to be evaluated, some may be new or changing and the patient may have concern these could be cancer.   The following portions of the chart were reviewed this encounter and updated as appropriate: medications, allergies, medical history  Review of Systems:  No other skin or systemic complaints except as noted in HPI or Assessment and Plan.  Objective  Well appearing patient in no apparent distress; mood and affect are within normal limits.   A focused examination was performed of the following areas: abdomen  Relevant exam findings are noted in the Assessment and Plan.  abdomen Firm pink/brown dermal papule(s)/plaque(s).    Assessment & Plan   HISTORY OF DYSPLASTIC NEVUS with pain at excision site No evidence of recurrence today Recommend regular full body skin exams Recommend daily broad spectrum sunscreen SPF 30+ to sun-exposed areas, reapply every 2 hours as needed.  Call if any new or changing lesions are noted between office visits  The scar may be causing her pain.  Discussed Serica gel for scar treatment and also discussed padding on the area.   Keloid x4 with pain abdomen 2ndary to Hx of gastric bypass surgery and gallbladder surgery  Previous Kenalog 10 injections did not seem to help much.  IL injections of 5FU/Kenalog 40 3:1 5FU NDC 16109-604-54 1 Lot [id number] exp 05/2023 Kenalog 40 NDC [phone] Lot [id number] exp 04/2025  Risks and benefits discussed,  Pt consents to procedure.    Intralesional injection - abdomen Location: abdomen, R side  Informed Consent: Discussed risks (infection, pain, bleeding, bruising, thinning of the skin, loss of skin pigment, lack of resolution,  and recurrence of lesion) and benefits of the procedure, as well as the alternatives. Informed consent was obtained. Preparation: The area was prepared a standard fashion.  Procedure Details: An intralesional injection was performed with 5-fluorouracil/Kenalog 40,  0.75 cc in total were injected. Total number of injections: 4  Plan: The patient was instructed on post-op care. Recommend OTC analgesia as needed for pain.   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  I, Ardis Rowan, RMA, am acting as scribe for Armida Sans, MD .   Documentation: I have reviewed the above documentation for accuracy and completeness, and I agree with the above.  Armida Sans, MD

## 2023-05-18 ENCOUNTER — Encounter: Payer: Self-pay | Admitting: Internal Medicine

## 2023-05-18 DIAGNOSIS — M059 Rheumatoid arthritis with rheumatoid factor, unspecified: Secondary | ICD-10-CM

## 2023-05-19 ENCOUNTER — Other Ambulatory Visit: Payer: Self-pay | Admitting: Internal Medicine

## 2023-05-19 ENCOUNTER — Other Ambulatory Visit: Payer: Self-pay

## 2023-05-19 DIAGNOSIS — Z79899 Other long term (current) drug therapy: Secondary | ICD-10-CM

## 2023-05-19 DIAGNOSIS — M059 Rheumatoid arthritis with rheumatoid factor, unspecified: Secondary | ICD-10-CM

## 2023-05-19 NOTE — Progress Notes (Signed)
Specialty Pharmacy Refill Coordination Note  Elizabeth Bautista is a 47 y.o. female contacted today regarding refills of specialty medication(s) Abatacept (Orencia ClickJect)   Patient requested Delivery   Delivery date: 05/26/23   Verified address: [address] [place], [address]   Medication will be filled on 12.23.24.  Refill request pending.   Patient reports defective injection device - provided patient with manufacture phone number.

## 2023-05-19 NOTE — Telephone Encounter (Signed)
Last Fill: 04/23/2023  Labs: 03/03/2023  RBC 3.12 HGB 8.5 HCT 27.1 MCHC 31.4 RDW SD 51.9 AGAP 5 Creatinine 0.46 Ca 7.2  TB Gold: 04/29/2023 Negative   Next Visit: 08/04/2023  Last Visit: 04/29/2023  UJ:[id number] rheumatoid arthritis   Current Dose per office note 04/29/2023: not mentioned  Okay to refill Orencia?

## 2023-05-20 ENCOUNTER — Telehealth: Payer: Self-pay | Admitting: Internal Medicine

## 2023-05-20 NOTE — Telephone Encounter (Signed)
Patient contacted the office to request a medication refill.   1. Name of Medication: Orencia  2. How are you currently taking this medication (dosage and times per day)? Inject once a week   3. What pharmacy would you like for that to be sent to? Pt did not know    Pt states she sent a mychart message to Dr. Dimple Casey and is waiting for his response.

## 2023-05-20 NOTE — Telephone Encounter (Signed)
Refill pending in Dr. Gregary Cromer box.

## 2023-05-25 ENCOUNTER — Other Ambulatory Visit: Payer: Self-pay

## 2023-05-25 MED ORDER — FOLIC ACID 1 MG PO TABS: 1.0000 mg | ORAL_TABLET | Freq: Every day | ORAL | 0 refills | Status: DC

## 2023-05-25 MED ORDER — TUBERCULIN SYRINGE 26G X 3/8" 1 ML MISC: 1.0000 | 0 refills | Status: DC

## 2023-05-25 MED ORDER — METHOTREXATE SODIUM CHEMO INJECTION 50 MG/2ML: 10.0000 mg | INTRAMUSCULAR | 1 refills | Status: DC

## 2023-05-25 MED ORDER — ORENCIA CLICKJECT 125 MG/ML ~~LOC~~ SOAJ
125.0000 mg | SUBCUTANEOUS | 0 refills | Status: DC
  Filled 2023-05-25: qty 4, 28d supply, fill #0
  Filled 2023-06-15: qty 4, 28d supply, fill #1
  Filled 2023-07-08: qty 4, 28d supply, fill #2

## 2023-05-25 NOTE — Progress Notes (Addendum)
05/25/23: Orencia  New Rx has been received. [facility].

## 2023-05-25 NOTE — Telephone Encounter (Signed)
Patient contacted the office again today and left message to stating she has left several messages last week. Patient would like a call back regarding her lab results and if there needs to be any adjustments to her medication.

## 2023-06-08 ENCOUNTER — Other Ambulatory Visit: Payer: Self-pay

## 2023-06-08 ENCOUNTER — Other Ambulatory Visit: Payer: Self-pay | Admitting: Physical Medicine and Rehabilitation

## 2023-06-08 MED ORDER — BACLOFEN 10 MG PO TABS: 5.0000 mg | ORAL_TABLET | Freq: Three times a day (TID) | ORAL | 1 refills | Status: DC

## 2023-06-12 ENCOUNTER — Other Ambulatory Visit: Payer: Self-pay

## 2023-06-15 ENCOUNTER — Other Ambulatory Visit: Payer: Self-pay

## 2023-06-15 NOTE — Progress Notes (Signed)
 Specialty Pharmacy Refill Coordination Note  Elizabeth Bautista is a 48 y.o. female contacted today regarding refills of specialty medication(s) Abatacept  (Orencia  ClickJect)   Patient requested Delivery   Delivery date: 06/18/23   Verified address: [address]   [place] [address]   Medication will be filled on 06/17/23.

## 2023-06-17 ENCOUNTER — Other Ambulatory Visit: Payer: Self-pay

## 2023-06-18 ENCOUNTER — Encounter
Payer: Medicare Other | Attending: Physical Medicine and Rehabilitation | Admitting: Physical Medicine and Rehabilitation

## 2023-06-18 VITALS — BP 144/87 | HR 64 | Ht 64.0 in | Wt 149.0 lb

## 2023-06-18 DIAGNOSIS — I10 Essential (primary) hypertension: Secondary | ICD-10-CM | POA: Insufficient documentation

## 2023-06-18 DIAGNOSIS — F439 Reaction to severe stress, unspecified: Secondary | ICD-10-CM | POA: Diagnosis not present

## 2023-06-18 DIAGNOSIS — G894 Chronic pain syndrome: Secondary | ICD-10-CM | POA: Insufficient documentation

## 2023-06-18 MED ORDER — AMITRIPTYLINE HCL 10 MG PO TABS: 10.0000 mg | ORAL_TABLET | Freq: Every day | ORAL | 1 refills | Status: DC

## 2023-06-18 MED ORDER — TOPIRAMATE 25 MG PO TABS: 25.0000 mg | ORAL_TABLET | Freq: Every day | ORAL | 1 refills | Status: AC

## 2023-06-18 MED ORDER — OXYCODONE HCL 10 MG PO TABS: 10.0000 mg | ORAL_TABLET | Freq: Three times a day (TID) | ORAL | 0 refills | Status: DC | PRN

## 2023-06-18 NOTE — Patient Instructions (Signed)
HTN: Advised checking BP daily at home and logging results to bring into follow-up appointment with PCP and myself. -Reviewed BP meds today.  -Advised regarding healthy foods that can help lower blood pressure and provided with a list: 1) citrus foods- high in vitamins and minerals 2) salmon and other fatty fish - reduces inflammation and oxylipins 3) swiss chard (leafy green)- high level of nitrates 4) pumpkin seeds- one of the best natural sources of magnesium 5) Beans and lentils- high in fiber, magnesium, and potassium 6) Berries- high in flavonoids 7) Amaranth (whole grain, can be cooked similarly to rice and oats)- high in magnesium and fiber 8) Pistachios- even more effective at reducing BP than other nuts 9) Carrots- high in phenolic compounds that relax blood vessels and reduce inflammation 10) Celery- contain phthalides that relax tissues of arterial walls 11) Tomatoes- can also improve cholesterol and reduce risk of heart disease 12) Broccoli- good source of magnesium, calcium, and potassium 13) Greek yogurt: high in potassium and calcium 14) Herbs and spices: Celery seed, cilantro, saffron, lemongrass, black cumin, ginseng, cinnamon, cardamom, sweet basil, and ginger 15) Chia and flax seeds- also help to lower cholesterol and blood sugar 16) Beets- high levels of nitrates that relax blood vessels  17) spinach and bananas- high in potassium  -Provided lise of supplements that can help with hypertension:  1) magnesium: one high quality brand is Bioptemizers since it contains all 7 types of magnesium, otherwise over the counter magnesium gluconate 400mg is a good option 2) B vitamins 3) vitamin D 4) potassium 5) CoQ10 6) L-arginine 7) Vitamin C 8) Beetroot -Educated that goal BP is 120/80. -Made goal to incorporate some of the above foods into diet.   
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
## 2023-06-25 ENCOUNTER — Telehealth: Payer: Self-pay | Admitting: Internal Medicine

## 2023-06-25 ENCOUNTER — Telehealth: Payer: Self-pay | Admitting: Physical Medicine and Rehabilitation

## 2023-06-25 ENCOUNTER — Telehealth: Payer: Self-pay

## 2023-06-25 NOTE — Telephone Encounter (Signed)
Advised in other telephone call note.

## 2023-06-25 NOTE — Telephone Encounter (Signed)
Patient contacted the office inquiring about the new medication that she is supposed to take with her Orencia. Advised the patient Methotrexate, Folic Acid, and Syringes [facility] [address]. In [place]. Advised the patient to contact the pharmacy.

## 2023-06-25 NOTE — Telephone Encounter (Signed)
Patient called in states the brace she was ordered was denied by her insurance due to office notes not providing enough information and patient is asking if Dr Carlis Abbott can contact them to see what additional information is needed to approve

## 2023-06-25 NOTE — Telephone Encounter (Signed)
Pt is asking when her medication for Methotrexate will be filled. Pt states they talked about it at her last visit and still has not received the prescription.

## 2023-06-26 ENCOUNTER — Telehealth: Payer: Self-pay | Admitting: *Deleted

## 2023-06-26 NOTE — Telephone Encounter (Signed)
Submitted a Prior Authorization request to [facility] for Methotrexate  via CoverMyMeds. Will update once we receive a response.

## 2023-06-29 NOTE — Telephone Encounter (Signed)
Received notification from [facility] regarding a prior authorization for Methotrexate Sodium Solution 50 MG/2ML. Authorization has been APPROVED from 06/26/2023 to until further notice.   Approved quantity: 4 units per 28 days.   Authorization # [id number] Phone # [phone]

## 2023-06-30 ENCOUNTER — Telehealth: Payer: Self-pay | Admitting: Internal Medicine

## 2023-06-30 NOTE — Telephone Encounter (Signed)
Patient contacted the office to request a medication refill.   1. Name of Medication: Syringes for her Methotrexate medication  2. How are you currently taking this medication (dosage and times per day)?    3. What pharmacy would you like for that to be sent to[facility] [address] [facility] [place]  Patient states she picked up her Methotrexate medication and doesn't have anyway to give herself injection.

## 2023-06-30 NOTE — Telephone Encounter (Signed)
LMOM Dr. Dimple Casey sent RX on 05/25/2023.

## 2023-07-01 NOTE — Telephone Encounter (Addendum)
Ms Elizabeth Bautista called back again about this brace she was promised for her ANKLE. [employer] does not have the notes needed.

## 2023-07-08 ENCOUNTER — Other Ambulatory Visit (HOSPITAL_COMMUNITY): Payer: Self-pay | Admitting: Pharmacy Technician

## 2023-07-08 ENCOUNTER — Other Ambulatory Visit: Payer: Self-pay

## 2023-07-08 ENCOUNTER — Other Ambulatory Visit (HOSPITAL_COMMUNITY): Payer: Self-pay

## 2023-07-08 NOTE — Progress Notes (Signed)
 Specialty Pharmacy Refill Coordination Note  Elizabeth Bautista is a 48 y.o. female contacted today regarding refills of specialty medication(s) Abatacept  (Orencia  [employer])   Patient requested Delivery   Delivery date: 07/15/23   Verified address: Patient address [address]  [place]   Medication will be filled on 07/14/23.

## 2023-07-16 ENCOUNTER — Encounter: Payer: Medicare Other | Attending: Physical Medicine and Rehabilitation | Admitting: Registered Nurse

## 2023-07-16 DIAGNOSIS — Z5181 Encounter for therapeutic drug level monitoring: Secondary | ICD-10-CM | POA: Insufficient documentation

## 2023-07-16 DIAGNOSIS — Z79891 Long term (current) use of opiate analgesic: Secondary | ICD-10-CM | POA: Insufficient documentation

## 2023-07-16 DIAGNOSIS — G894 Chronic pain syndrome: Secondary | ICD-10-CM | POA: Insufficient documentation

## 2023-07-20 ENCOUNTER — Other Ambulatory Visit: Payer: Self-pay | Admitting: Internal Medicine

## 2023-07-20 DIAGNOSIS — M059 Rheumatoid arthritis with rheumatoid factor, unspecified: Secondary | ICD-10-CM

## 2023-07-20 NOTE — Telephone Encounter (Signed)
Last Fill: 05/25/2023  Labs: 07/10/2023 ([facility] Tab) Creatinine 0.51 Calcium 8.1 Protein 5.6 Albumin 3.6 Alkaline Phosphatase 175 Hemoglobin 8 Hematocrit % 28.2 MCV 71 MCH 20.2 MCHC 28.4 RDW 15.5  Next Visit: 08/04/2023  Last Visit: 04/29/2023  DX: Seropositive rheumatoid arthritis (HCC)   Current Dose per office note 04/29/2023: not mentioned  Okay to refill Methotrexate?

## 2023-07-21 NOTE — Progress Notes (Deleted)
 Office Visit Note  Patient: Elizabeth Bautista             Date of Birth: Aug 03, 1975           MRN: [id number]             PCP: Mignon Pine Referring: Mignon Pine Visit Date: 08/04/2023   Subjective:  No chief complaint on file.   History of Present Illness: Elizabeth Bautista is a 48 y.o. female here for follow up seropositive rheumatoid arthritis on Orencia 125 mg [place] weekly here due to increased joint pain, particularly in the ankles, over the past month.    Previous HPI 04/29/2023 Elizabeth Bautista is a 48 y.o. female with seropositive rheumatoid arthritis on Orencia 125 mg [place] weekly here due to increased joint pain, particularly in the ankles, over the past month. The pain is described as so severe that it impedes the ability to walk, with some days being completely incapacitating. The patient also reports several falls due to the pain. The pain radiates from the ankles to the underside of the foot and is accompanied by swelling. The patient notes that the right ankle has sometimes been more inflamed than the other, although both are painful.   Pain management has recently switched the patient to oxycodone, which provides temporary relief but does not alleviate the persistent daily pain. Physical therapy has been attempted but was deemed ineffective due to the patient's inability to walk or move the ankles. The patient expressed a preference for aqua therapy, which previously provided more relief, but has been unable to continue due to ongoing diarrhea issues since bariatric surgery   In addition to the ankle pain, the patient reports increased swelling and pain in the right hand, to the point of being unable to close it. The patient continues to take Orencia shots for rheumatoid arthritis management, which were effective prior to the recent increase in pain.   The patient also reports ongoing gastric issues, including persistent diarrhea and vomiting, since a  recent surgery. These issues have been associated with malabsorption, leading to nutritional deficiencies and hair loss. The patient was hospitalized last month due to these issues, and lab results indicated low levels of certain nutrients.    Previous HPI 11/07/22 Elizabeth Bautista is a 48 y.o. female here for follow up for seropositive RA currently off treatment she discontinued Enbrel treatment after she had new findings of dysplastic nevus with surgical excision on the bottom of the right foot and plan for follow-up excision of 1 on the left foot.  She stopped taking the medicine for concern of this contributing to skin cancer risk.  Previously had no known personal or family history for cancer.  Since discontinuing the Enbrel she has increased pain and stiffness in multiple areas.  Starting to see additional swelling again in the right knee and right hand most frequently.   Previous HPI: 10/11/20 Elizabeth Bautista is a 48 y.o. female here for evaluation and management of rheumatoid arthritis. Symptoms started at least since 2017 with hand, wrist, and knee pain and swelling periodically and worsened over time. She did not seek medication evaluation for some time due to being very busy with work and symptoms progressively increased. She was hospitalized for severe pain and swelling especially in the left wrist and knee and workup at that time was more extensive and revealed positive RA serology suspected as the cause. She saw rheumatology since 2020 and started treatment initial  with prednisone that did not control symptoms well but caused a lot of weight gain. She has had chronic steroid exposure due to refractory asthma symptoms over the years. She also started hydroxychloroquine that apparently helped symptoms reasonably well initially but has worsened. She moved form [place] a year ago due to safety circumstances and has some family in the area and is continuing HCQ treatment but overall having a lot of  joint pain. Currently feels swelling and heat in the joints multiple days per week and walks with either a cane or walker for her left knee pain and instability depending on how badly it is acting up. She was recently hospitalized for asthma exacerbation on 4/28 and discharged 5/2.   Labs reviewed 10/2018 RF ~30, CCP >250 ANA neg   Imaging reviewed Chest xray 08/2020 Cardiomegaly no airspace disease   Lumbar spine xray 08/2020 No acute findings   No Rheumatology ROS completed.   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   Mixed stress and urge urinary incontinence 08/14/2022   Encounter for screening fecal occult blood testing 08/14/2022   Encounter for well woman exam with routine gynecological exam 08/14/2022   Decreased libido 08/14/2022   Perimenopause 08/14/2022   Screening examination for STD (sexually transmitted disease) 08/14/2022   Otalgia of both ears 07/09/2022   Vitamin B1 deficiency 04/22/2022   Gastric ulcer 02/11/2022   Laryngopharyngeal reflux (LPR) 12/26/2021   History of gastric bypass 10/21/2021   Spinal stenosis of lumbar region with neurogenic claudication 08/27/2021   Fall 08/27/2021   Sinusitis 08/11/2021   Allergic rhinitis 08/07/2021   Hospital discharge follow-up 08/02/2021   (HFpEF) heart failure with preserved ejection fraction (HCC) 07/20/2021   Body mass index (BMI) 45.0-49.9, adult (HCC) 02/05/2021   Iron deficiency anemia due to chronic blood loss 10/17/2020    Tension-type headache, not intractable 10/17/2020   High risk medication use 10/11/2020   Acute hip pain, left 09/19/2020   Sciatica 09/11/2020   Dysmenorrhea 06/04/2020   Menorrhagia with irregular cycle 06/04/2020   Anxiety and depression 06/04/2020   Physical deconditioning 05/24/2020   Severe persistent asthma 03/08/2020   GERD (gastroesophageal reflux disease) 03/07/2020   Anxiety 03/07/2020   History of seizures 03/07/2020   Swelling of lower extremity 02/01/2020   Essential hypertension 02/01/2020   Vitamin D deficiency 02/01/2020   Type 2 diabetes mellitus with hyperglycemia, without long-term current use of insulin (HCC) 02/01/2020   Anemia 02/01/2020   Class 3 severe obesity with serious comorbidity and body mass index (BMI) of 45.0 to 49.9 in adult (HCC) 10/22/2019   OSA on CPAP 10/18/2019   Seropositive rheumatoid arthritis (HCC) 10/18/2019    Past Medical History:  Diagnosis Date   Acute respiratory failure with hypoxia (HCC)    Anemia    Asthma    COVID-19 virus infection 05/16/2020   04/27/20 dx covid-19, not hospitalized   Diabetes (HCC)    on Metformin   Dysplastic nevus 09/24/2022   R ant sole - severe, Excised 10/14/22   Eczema  Edema    Heart problem    "something with the arteries on the left side of the heart"; upcoming appt with cardiology for evaluation   HTN (hypertension)    Rheumatoid arthritis (HCC)    on Plaquenil    Seizures (HCC)    last seizure 2019   Sleep apnea    Sleep apnea     Family History  Problem Relation Age of Onset   High blood pressure Mother    Rheum arthritis Mother    Diabetes Father    Diabetes Sister    Heart Problems Sister    Diabetes Brother    Diabetes Paternal Grandmother    Diabetes Other        father's side "everybody died from Diabetes"   High blood pressure Other        mother's side, multiple siblings with this    Heart attack Other        family member on mother's side    Diabetes Paternal Aunt     Seizures Cousin        not sibings to the other cousins with seizures   Breast cancer Cousin    Seizures Cousin        not sibings to the other cousins with seizures   Seizures Cousin        not sibings to the other cousins with seizures   Cervical cancer Maternal Aunt    Breast cancer Cousin    Dementia Maternal Aunt    Asthma Maternal Aunt    Heart Problems Maternal Grandmother    Diabetes Brother    Asthma Daughter    Angioedema Daughter    Asthma Son    Past Surgical History:  Procedure Laterality Date   APPENDECTOMY     CESAREAN SECTION     x3   CHOLECYSTECTOMY  02/17/2022   FOOT SURGERY  2024   GASTRIC BYPASS     LAPAROSCOPIC GASTROTOMY W/ REPAIR OF ULCER  02/17/2022   TUBAL LIGATION  2003   Social History   Social History Narrative   Divorced.Lives with 3 kids.Originally from [place].Came from [place] ,[place] 7 months ago.      02/27/2020   Right handed   Caffeine: none     There is no immunization history on file for this patient.   Objective: Vital Signs: There were no vitals taken for this visit.   Physical Exam   Musculoskeletal Exam: ***  CDAI Exam: CDAI Score: -- Patient Global: --; Provider Global: -- Swollen: --; Tender: -- Joint Exam 08/04/2023   No joint exam has been documented for this visit   There is currently no information documented on the homunculus. Go to the Rheumatology activity and complete the homunculus joint exam.  Investigation: No additional findings.  Imaging: No results found.  Recent Labs: Lab Results  Component Value Date   WBC 4.1 11/07/2022   HGB 7.0 (L) 11/07/2022   PLT 286 11/07/2022   NA 139 11/07/2022   K 3.9 11/07/2022   CL 106 11/07/2022   CO2 27 11/07/2022   GLUCOSE 85 11/07/2022   BUN 8 11/07/2022   CREATININE 0.54 11/07/2022   BILITOT 0.3 11/07/2022   ALKPHOS 85 06/09/2022   AST 12 11/07/2022   ALT 9 11/07/2022   PROT 5.9 (L) 11/07/2022   ALBUMIN 3.2 (L) 06/09/2022   CALCIUM 8.3 (L)  11/07/2022   GFRAA 121 10/11/2020   QFTBGOLDPLUS NEGATIVE 04/29/2023    Speciality Comments: PLQ EYE EXAM 09/12/2021 [place]  f/u 2-3 months Enbrel started 01/29/11  Methotrexate Approved Quantity: 4 units per 28 days.  Procedures:  No procedures performed Allergies: Ferumoxytol, Phenytoin, Pecan nut (diagnostic), and Phenylbutazones   Assessment / Plan:     Visit Diagnoses: No diagnosis found.  ***  Orders: No orders of the defined types were placed in this encounter.  No orders of the defined types were placed in this encounter.    Follow-Up Instructions: No follow-ups on file.   Metta Clines, RT  Note - This record has been created using [employer].  Chart creation errors have been sought, but may not always  have been located. Such creation errors do not reflect on  the standard of medical care.

## 2023-07-29 NOTE — Progress Notes (Unsigned)
 NEUROLOGY FOLLOW UP OFFICE NOTE  Aaryanna Cassandra Harbold [id number]  Assessment/Plan:   Migraine without aura, without status migrainosus, not intractable   Increased frequency related to stress.  Will not make any changes now.  Will see if stabilizes with better control of her anxiety. Migraine prevention:  Aimovig 140mg  every 4 weeks  Migraine rescue:  It appears that [place] isn't formulary.  Will provide samples of Nurtec Limit use of pain relievers to no more than 2 days out of week to prevent risk of rebound or medication-overuse headache. Keep headache diary Follow up 6 months       Subjective:  Guillermina Ledgerwood is a 48 year old right-handed female with asthma. HTN, DM II, RA, iron-deficiency anemia, sleep apnea and pseudoseizures who follows up for migraines.  UPDATE: They had been well controlled (occurring 2-3 times a month) up until 2 months ago due to increased stress.  She saw the eye doctor tweaked er prescription.  Blood pressure has been elevated as well.  Her psychiatrist has changed her anxiety medications, so hopefully they will stabilize. Intensity:  severe Duration:  15 minutes with Bernita Raisin Frequency:  3 to 4 days a week.    Current NSAIDS/analgesics:  oxycodone (chronic pain), Mobic (chronic pain) Current triptans:  none Current ergotamine:  none Current anti-emetic:  Reglan 10mg , Zofran Current muscle relaxants:  baclofen rotating with Flexeril Current Antihypertensive medications:  metoprolol tartrate, amlodipine, HCTZ Current Antidepressant medications:  duloxetine 60mg  daily, mirtazapine 30mg   Current Anticonvulsant medications:  topiramate 50mg  QHS (for chronic pain by her pain doctor) Current anti-CGRP:  none Current Antihistamines/Decongestants:  Zyrtec, meclizine, scopolamine Other therapy:  none Hormone/birth control:  none Other medications:  buspirone, methotrexate, Savella  Caffeine:  no Alcohol:  occasional Smoker:  no Diet:  Drinks a lot of  water.  Planning to have bariatric surgery Exercise:  she does but limited due to chronic pain (RA and fibromyalgia) Depression:  yes; Anxiety:  yes.  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 CT head on 03/10/2020 personally reviewed normal.  EEG on 03/12/2020 normal. Other pain:  Chronic pain syndrome - has RA but also underlying fibromyalgia suspected.  Followed by physical medicine and rehab Sleep hygiene:  Poor sleep.  May be related to stress.  She has OSA.  CPAP recently adjusted.   HISTORY: History of migraines since childhood but resolved in young adulthood.  They returned around 2021 but progressively has gotten worse.  Severe diffuse squeezing and stabbing headache including the neck.  Back of neck gets stiff.  Nausea, vomiting, blurred vision/or vision goes dark; She saw an ophthalmologist and had an unremarkable eye exam.  Sometimes bilateral hand tingling, photophobia and phonophobia.  Usually last all day.  They occur usually 4 days a week.  No known triggers.  Nothing helps them.  She also reports short term memory problems in 2021 as well.  She says she will sometimes leave the stove on or may forget conversations.  Prior thyroid tests okay.  Larey Seat a couple of weeks ago and hit the back of her head.  Has a lump on back of head.  MRI of brain with and without contrast on 09/24/2021 was normal.      Past NSAIDS/analgesics:  tramadol, ibuprofen, naproxen Past abortive triptans:  sumatriptan tab, rizatriptan Past abortive ergotamine:  none Past muscle relaxants:  Flexeril Past anti-emetic: Promethazine Past antihypertensive medications:  amlodipine, Lasix, HCTZ.  Beta blockers contraindicated due to severe asthma.   Past antidepressant  medications:  duloxetine, escitalopram Past anticonvulsant medications:  Keppra, gabapentin, pregablin, zonisamide Past anti-CGRP:  none Past antihistamines/decongestants:  Flonase Other past therapies:  none     Family history of headache:  Mother (migraines)    PAST MEDICAL HISTORY: Past Medical History:  Diagnosis Date   Acute respiratory failure with hypoxia (HCC)    Anemia    Asthma    COVID-19 virus infection 05/16/2020   04/27/20 dx covid-19, not hospitalized   Diabetes (HCC)    on Metformin   Dysplastic nevus 09/24/2022   R ant sole - severe, Excised 10/14/22   Eczema    Edema    Heart problem    "something with the arteries on the left side of the heart"; upcoming appt with cardiology for evaluation   HTN (hypertension)    Rheumatoid arthritis (HCC)    on Plaquenil    Seizures (HCC)    last seizure 2019   Sleep apnea    Sleep apnea     MEDICATIONS: Current Outpatient Medications on File Prior to Visit  Medication Sig Dispense Refill   Abatacept (ORENCIA CLICKJECT) 125 MG/ML SOAJ Inject 125 mg into the skin once a week. 12 mL 0   albuterol (PROAIR HFA) 108 (90 Base) MCG/ACT inhaler Inhale 2 puffs into the lungs every 6 (six) hours as needed for wheezing or shortness of breath. 2 puffs every 4 hours as needed only  if your can't catch your breath 18 g 11   albuterol (PROVENTIL) (2.5 MG/3ML) 0.083% nebulizer solution Take 3 mLs (2.5 mg total) by nebulization every 6 (six) hours as needed for wheezing or shortness of breath. 360 mL 5   amLODipine (NORVASC) 5 MG tablet Take by mouth.     Ascorbic Acid (VITAMIN C) 1000 MG tablet Take 1,000 mg by mouth daily.     baclofen (LIORESAL) 10 MG tablet Take 0.5 tablets (5 mg total) by mouth 3 (three) times daily. 90 tablet 1   blood glucose meter kit and supplies Dispense based on patient and insurance preference. Use up to four times daily as directed. (FOR ICD-10 E10.9, E11.9). 1 each 0   busPIRone (BUSPAR) 7.5 MG tablet Take 7.5 mg by mouth daily.     cetirizine (ZYRTEC) 10 MG tablet TAKE 1 TABLET BY MOUTH EVERY DAY 90 tablet 1   colestipol (COLESTID) 1 g tablet Take 2 g by mouth daily.     cyclobenzaprine (FLEXERIL) 5 MG tablet  Take 1 tablet (5 mg total) by mouth 3 (three) times daily as needed for muscle spasms. 270 tablet 0   doxycycline (MONODOX) 100 MG capsule Take 100 mg by mouth 2 (two) times daily.     DULoxetine (CYMBALTA) 60 MG capsule Take by mouth.     EPINEPHrine 0.3 mg/0.3 mL IJ SOAJ injection Inject 0.3 mg into the muscle as needed for anaphylaxis. 1 each 1   Erenumab-aooe (AIMOVIG) 140 MG/ML SOAJ Inject 140 mg into the skin every 30 (thirty) days. 1.12 mL 11   eszopiclone (LUNESTA) 1 MG TABS tablet Take 1 tablet (1 mg total) by mouth at bedtime as needed for sleep. Take immediately before bedtime 30 tablet 3   famotidine (PEPCID) 20 MG tablet Take 1 tablet (20 mg total) by mouth daily. 30 tablet 2   ferrous sulfate 325 (65 FE) MG tablet TAKE 1 TABLET BY MOUTH TWICE A DAY 180 tablet 1   FLOWFLEX COVID-19 AG HOME TEST KIT See admin instructions.     Fluticasone-Umeclidin-Vilant (TRELEGY ELLIPTA) 200-62.5-25 MCG/ACT  AEPB Inhale 1 puff into the lungs daily. 60 each 5   folic acid (FOLVITE) 1 MG tablet Take 1 tablet (1 mg total) by mouth daily. 90 tablet 0   furosemide (LASIX) 20 MG tablet Take 1 tablet (20 mg total) by mouth daily as needed. 30 tablet 3   hydrochlorothiazide (HYDRODIURIL) 25 MG tablet Take by mouth.     ipratropium-albuterol (DUONEB) 0.5-2.5 (3) MG/3ML SOLN Take 3 mLs by nebulization every 6 (six) hours as needed. 360 mL 11   meclizine (ANTIVERT) 12.5 MG tablet TAKE ONE TABLET (12.5 MG DOSE) BY MOUTH 3 (THREE) TIMES A DAY AS NEEDED FOR NAUSEA.     meloxicam (MOBIC) 7.5 MG tablet      methotrexate 50 MG/2ML injection INJECT 0.4 ML INTO THE SKIN ONCE A WEEK. 6 mL 0   metoCLOPramide (REGLAN) 10 MG tablet Take by mouth.     metoprolol tartrate (LOPRESSOR) 50 MG tablet Take by mouth.     Milnacipran HCl (SAVELLA) 12.5 MG TABS Take 1 tablet (12.5 mg total) by mouth at bedtime. 90 tablet 3   mirtazapine (REMERON) 30 MG tablet Take by mouth.     montelukast (SINGULAIR) 10 MG tablet TAKE 1 TABLET BY  MOUTH EVERYDAY AT BEDTIME 30 tablet 11   Multiple Vitamin (MULTIVITAMIN) tablet Take 1 tablet by mouth daily.     mupirocin ointment (BACTROBAN) 2 % Apply 1 Application topically daily. Qd to excision site 22 g 1   omeprazole (PRILOSEC) 40 MG capsule Take by mouth.     ondansetron (ZOFRAN-ODT) 8 MG disintegrating tablet Take by mouth.     Oxycodone HCl 10 MG TABS Take 1 tablet (10 mg total) by mouth 3 (three) times daily as needed. 90 tablet 0   pantoprazole (PROTONIX) 40 MG tablet TAKE 1 TABLET (40 MG TOTAL) BY MOUTH DAILY. TAKE 30-60 MIN BEFORE FIRST MEAL OF THE DAY 90 tablet 0   potassium chloride SA (KLOR-CON M) 20 MEQ tablet Take 1 tablet (20 mEq total) by mouth 2 (two) times daily. 6 tablet 0   pregabalin (LYRICA) 200 MG capsule Take 1 capsule (200 mg total) by mouth 2 (two) times daily. 60 capsule 2   QUEtiapine (SEROQUEL) 100 MG tablet      rizatriptan (MAXALT-MLT) 10 MG disintegrating tablet Take 1 tablet earliest onset of migraine.  May repeat in 2 hours if needed. Maximum 2 tablets in 24 hours     scopolamine (TRANSDERM-SCOP) 1 MG/3DAYS PLACE 1 PATCH ONTO THE SKIN EVERY 3 DAYS. 10 patch 12   Thiamine HCl (VITAMIN B-1) 250 MG tablet Take by mouth.     topiramate (TOPAMAX) 25 MG tablet Take 1 tablet (25 mg total) by mouth at bedtime. 180 tablet 1   tretinoin (RETIN-A) 0.025 % cream Apply pea sized amount to face qhs for acne 45 g 11   triamcinolone (KENALOG) 0.025 % ointment Apply 1 Application topically 2 (two) times daily. 30 g 2   TUBERCULIN SYR 1CC/26GX3/8" 26G X 3/8" 1 ML MISC 1 each by Does not apply route once a week. 25 each 0   Ubrogepant (UBRELVY) 100 MG TABS Take 1 tablet (100 mg total) by mouth as needed. May repeat after 2 hours.  Maximum 2 tablets in 24 hours. 10 tablet 11   Vitamin D, Ergocalciferol, (DRISDOL) 1.25 MG (50000 UNIT) CAPS capsule Take 1 capsule (50,000 Units total) by mouth every 7 (seven) days. 7 capsule 0   zonisamide (ZONEGRAN) 100 MG capsule Take by mouth.  No current facility-administered medications on file prior to visit.    ALLERGIES: Allergies  Allergen Reactions   Ferumoxytol Anaphylaxis    ~5 years ago. Tolerated IV venofer   Phenytoin Anaphylaxis    Other reaction(s): swelling and heart rate decreased   Pecan Nut (Diagnostic) Other (See Comments)   Phenylbutazones Other (See Comments)    FAMILY HISTORY: Family History  Problem Relation Age of Onset   High blood pressure Mother    Rheum arthritis Mother    Diabetes Father    Diabetes Sister    Heart Problems Sister    Diabetes Brother    Diabetes Paternal Grandmother    Diabetes Other        father's side "everybody died from Diabetes"   High blood pressure Other        mother's side, multiple siblings with this    Heart attack Other        family member on mother's side    Diabetes Paternal Aunt    Seizures Cousin        not sibings to the other cousins with seizures   Breast cancer Cousin    Seizures Cousin        not sibings to the other cousins with seizures   Seizures Cousin        not sibings to the other cousins with seizures   Cervical cancer Maternal Aunt    Breast cancer Cousin    Dementia Maternal Aunt    Asthma Maternal Aunt    Heart Problems Maternal Grandmother    Diabetes Brother    Asthma Daughter    Angioedema Daughter    Asthma Son       Objective:  Blood pressure 126/76, pulse 69, height 5\' 3"  (1.6 m), weight 144 lb 3.2 oz (65.4 kg). 9 General: No acute distress.  Patient appears well-groomed.   Head:  Normocephalic/atraumatic Neck:  Supple.  No paraspinal tenderness.  Full range of motion. Heart:  Regular rate and rhythm. Neuro:  Alert and oriented.  Speech fluent and not dysarthric.  Language intact.  CN II-XII intact.  Bulk and tone normal.  Muscle strength 5/5 throughout.  Deep tendon reflexes 2+ throughout.  Gait normal.  Romberg negative.    Shon Millet, DO  CC: Trena Platt, MD

## 2023-07-31 ENCOUNTER — Ambulatory Visit (INDEPENDENT_AMBULATORY_CARE_PROVIDER_SITE_OTHER): Payer: Medicare Other | Admitting: Neurology

## 2023-07-31 ENCOUNTER — Ambulatory Visit: Payer: Medicare Other | Admitting: Neurology

## 2023-07-31 ENCOUNTER — Encounter: Payer: Self-pay | Admitting: Neurology

## 2023-07-31 VITALS — BP 126/76 | HR 69 | Ht 63.0 in | Wt 144.2 lb

## 2023-07-31 DIAGNOSIS — G43009 Migraine without aura, not intractable, without status migrainosus: Secondary | ICD-10-CM

## 2023-07-31 MED ORDER — AIMOVIG 140 MG/ML ~~LOC~~ SOAJ: 140.0000 mg | SUBCUTANEOUS | 11 refills | Status: AC

## 2023-07-31 NOTE — Patient Instructions (Signed)
 Refill Aimovig 140mg  every 30 days Instead of Ubrelvy, try Nurtec (1 tablet daily as needed).  Let me know if it works

## 2023-08-04 ENCOUNTER — Ambulatory Visit: Payer: Medicare Other | Admitting: Internal Medicine

## 2023-08-04 ENCOUNTER — Telehealth: Payer: Self-pay

## 2023-08-04 DIAGNOSIS — K529 Noninfective gastroenteritis and colitis, unspecified: Secondary | ICD-10-CM

## 2023-08-04 DIAGNOSIS — M059 Rheumatoid arthritis with rheumatoid factor, unspecified: Secondary | ICD-10-CM

## 2023-08-04 DIAGNOSIS — Z79899 Other long term (current) drug therapy: Secondary | ICD-10-CM

## 2023-08-04 NOTE — Telephone Encounter (Signed)
 Task completed

## 2023-08-04 NOTE — Telephone Encounter (Signed)
 [facility] has received Avenly E. Bellows order for bilateral AFO's. They will need you to up-date her last visit note. To include the necessity for the bilateral AFO's.  Please fax note to [facility] [phone], ph [phone]. Thank you

## 2023-08-06 ENCOUNTER — Other Ambulatory Visit: Payer: Self-pay

## 2023-08-06 ENCOUNTER — Ambulatory Visit: Payer: Medicare Other | Admitting: Dermatology

## 2023-08-10 ENCOUNTER — Other Ambulatory Visit: Payer: Self-pay | Admitting: Internal Medicine

## 2023-08-10 ENCOUNTER — Other Ambulatory Visit (HOSPITAL_COMMUNITY): Payer: Self-pay

## 2023-08-10 ENCOUNTER — Other Ambulatory Visit: Payer: Self-pay

## 2023-08-10 DIAGNOSIS — Z79899 Other long term (current) drug therapy: Secondary | ICD-10-CM

## 2023-08-10 DIAGNOSIS — M059 Rheumatoid arthritis with rheumatoid factor, unspecified: Secondary | ICD-10-CM

## 2023-08-10 MED ORDER — ORENCIA CLICKJECT 125 MG/ML ~~LOC~~ SOAJ
125.0000 mg | SUBCUTANEOUS | 0 refills | Status: DC
  Filled 2023-08-10: qty 4, 28d supply, fill #0
  Filled 2023-09-07 (×2): qty 4, 28d supply, fill #1
  Filled 2023-10-02: qty 4, 28d supply, fill #2

## 2023-08-10 NOTE — Progress Notes (Signed)
 Specialty Pharmacy Refill Coordination Note  Elizabeth Bautista is a 48 y.o. female contacted today regarding refills of specialty medication(s) Abatacept (Orencia ClickJect)   Patient requested Delivery   Delivery date: 08/14/23   Verified address: Patient address [address]  [place]   Medication will be filled on 08/13/23.

## 2023-08-10 NOTE — Telephone Encounter (Signed)
 Last Fill: 05/25/2023  Labs: 06/30/2023 WBC 2.8, HGB 8.2, HCT 27.0, MCV 68.0, MCH 30.4, RDW CV 17.9, Absolute Neutrophil 1.28, BMP 03/03/2023 AGAP 5, Creatinine 0.46, Ca 7.2  TB Gold: 04/29/2023  NEGATIVE   Next Visit: 09/15/2023  Last Visit: 04/29/2023  DX: Seropositive rheumatoid arthritis   Current Dose per office note 04/29/2023: Orencia 125 mg [place] weekly   Okay to refill Orencia?

## 2023-08-13 ENCOUNTER — Telehealth: Payer: Self-pay | Admitting: Physical Medicine and Rehabilitation

## 2023-08-13 NOTE — Telephone Encounter (Signed)
 Pt called and wanted to know if her appt could be virtual tomorrow bc she is in a lot of pain

## 2023-08-14 ENCOUNTER — Encounter
Payer: Medicare Other | Attending: Physical Medicine and Rehabilitation | Admitting: Physical Medicine and Rehabilitation

## 2023-08-14 DIAGNOSIS — F419 Anxiety disorder, unspecified: Secondary | ICD-10-CM | POA: Diagnosis not present

## 2023-08-14 DIAGNOSIS — F32A Depression, unspecified: Secondary | ICD-10-CM | POA: Insufficient documentation

## 2023-08-14 DIAGNOSIS — M25572 Pain in left ankle and joints of left foot: Secondary | ICD-10-CM | POA: Diagnosis not present

## 2023-08-14 DIAGNOSIS — G894 Chronic pain syndrome: Secondary | ICD-10-CM | POA: Diagnosis not present

## 2023-08-14 DIAGNOSIS — M25571 Pain in right ankle and joints of right foot: Secondary | ICD-10-CM | POA: Diagnosis not present

## 2023-08-14 MED ORDER — OXYCODONE HCL 10 MG PO TABS: 10.0000 mg | ORAL_TABLET | Freq: Three times a day (TID) | ORAL | 0 refills | Status: DC | PRN
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
## 2023-08-28 ENCOUNTER — Other Ambulatory Visit: Payer: Self-pay | Admitting: Physical Medicine and Rehabilitation

## 2023-08-31 ENCOUNTER — Encounter: Payer: Self-pay | Admitting: Neurology

## 2023-09-01 ENCOUNTER — Ambulatory Visit: Admitting: Dermatology

## 2023-09-02 ENCOUNTER — Other Ambulatory Visit: Payer: Self-pay

## 2023-09-02 NOTE — Progress Notes (Signed)
 Office Visit Note  Patient: Elizabeth Bautista             Date of Birth: [birth year]           MRN: [id number]             PCP: Isabel Many Referring: Wilton Hasting, PA-C Visit Date: 09/15/2023   Subjective:  Follow-up (Patient states the methotrexate is helping but inquires if the dose can be upped. )   Discussed the use of AI scribe software for clinical note transcription with the patient, who gave verbal consent to proceed.  History of Present Illness   Elizabeth Bautista is a 48 y.o. female here for follow up seropositive rheumatoid arthritis on Orencia 125 mg [place] weekly, methotrexate 0.4 mL [place] weekly, and folic acid 1 mg daily.   She has experienced some improvement with methotrexate, although not complete. Her hands remain the most affected area, with persistent swelling, particularly in the right hand knuckles. The swelling has decreased but is still present, impacting her daily activities. Her ankles also swell but are less painful than before. The pain in her ankles has subsided significantly compared to her hands.  She denies any side effects from methotrexate such as nausea or diarrhea, but has experienced weight gain, which she attributes to another medication for depression. She is currently on a low dose of methotrexate, 0.4 mg, due to previous concerns about liver enzyme levels, which have since normalized following weight loss.  She reports ongoing knee pain, though less intense than previously. She uses a knee brace, which requires adjustment due to size changes, and notes that she does not feel the need for it as much anymore. She no longer uses a cane when not in pain.  No recent respiratory infections or significant illnesses. She reports cold hands and feet without color changes and experiences morning stiffness in her joints.       Previous HPI 04/29/2023 Elizabeth Bautista is a 48 y.o. female with seropositive rheumatoid arthritis on Orencia  125 mg  weekly here due to increased joint pain, particularly in the ankles, over the past month. The pain is described as so severe that it impedes the ability to walk, with some days being completely incapacitating. The patient also reports several falls due to the pain. The pain radiates from the ankles to the underside of the foot and is accompanied by swelling. The patient notes that the right ankle has sometimes been more inflamed than the other, although both are painful.   Pain management has recently switched the patient to oxycodone, which provides temporary relief but does not alleviate the persistent daily pain. Physical therapy has been attempted but was deemed ineffective due to the patient's inability to walk or move the ankles. The patient expressed a preference for aqua therapy, which previously provided more relief, but has been unable to continue due to ongoing diarrhea issues since bariatric surgery   In addition to the ankle pain, the patient reports increased swelling and pain in the right hand, to the point of being unable to close it. The patient continues to take Orencia shots for rheumatoid arthritis management, which were effective prior to the recent increase in pain.   The patient also reports ongoing gastric issues, including persistent diarrhea and vomiting, since a recent surgery. These issues have been associated with malabsorption, leading to nutritional deficiencies and hair loss. The patient was hospitalized last month due to these issues, and lab results  indicated low levels of certain nutrients.    Previous HPI 11/07/22 Elizabeth Bautista is a 48 y.o. female here for follow up for seropositive RA currently off treatment she discontinued Enbrel treatment after she had new findings of dysplastic nevus with surgical excision on the bottom of the right foot and plan for follow-up excision of 1 on the left foot.  She stopped taking the medicine for concern of this  contributing to skin cancer risk.  Previously had no known personal or family history for cancer.  Since discontinuing the Enbrel she has increased pain and stiffness in multiple areas.  Starting to see additional swelling again in the right knee and right hand most frequently.   Previous HPI: 10/11/20 Elizabeth Bautista is a 48 y.o. female here for evaluation and management of rheumatoid arthritis. Symptoms started at least since 2017 with hand, wrist, and knee pain and swelling periodically and worsened over time. She did not seek medication evaluation for some time due to being very busy with work and symptoms progressively increased. She was hospitalized for severe pain and swelling especially in the left wrist and knee and workup at that time was more extensive and revealed positive RA serology suspected as the cause. She saw rheumatology since 2020 and started treatment initial with prednisone that did not control symptoms well but caused a lot of weight gain. She has had chronic steroid exposure due to refractory asthma symptoms over the years. She also started hydroxychloroquine that apparently helped symptoms reasonably well initially but has worsened. She moved form [place]  a year ago due to safety circumstances and has some family in the area and is continuing HCQ treatment but overall having a lot of joint pain. Currently feels swelling and heat in the joints multiple days per week and walks with either a cane or walker for her left knee pain and instability depending on how badly it is acting up. She was recently hospitalized for asthma exacerbation on 4/28 and discharged 5/2.   Labs reviewed 10/2018 RF ~30, CCP >250 ANA neg   Imaging reviewed Chest xray 08/2020 Cardiomegaly no airspace disease   Lumbar spine xray 08/2020 No acute findings   Review of Systems  Constitutional:  Positive for fatigue.  HENT:  Positive for mouth dryness. Negative for mouth sores.   Eyes:  Positive for dryness.   Respiratory:  Positive for shortness of breath.   Cardiovascular:  Positive for chest pain and palpitations.  Gastrointestinal:  Positive for diarrhea. Negative for blood in stool and constipation.  Endocrine: Positive for increased urination.  Genitourinary:  Positive for involuntary urination.  Musculoskeletal:  Positive for joint pain, gait problem, joint pain, joint swelling, myalgias, muscle weakness, morning stiffness, muscle tenderness and myalgias.  Skin:  Positive for color change, rash, hair loss and sensitivity to sunlight.  Allergic/Immunologic: Positive for susceptible to infections.  Neurological:  Positive for dizziness and headaches.  Hematological:  Negative for swollen glands.  Psychiatric/Behavioral:  Positive for depressed mood and sleep disturbance. The patient is nervous/anxious.     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   Mixed stress and urge urinary incontinence 08/14/2022   Encounter for screening fecal occult blood testing 08/14/2022   Encounter for well woman exam with routine gynecological exam 08/14/2022   Decreased libido 08/14/2022   Perimenopause 08/14/2022   Screening examination for STD (sexually transmitted disease) 08/14/2022   Otalgia of both ears 07/09/2022   Vitamin B1 deficiency 04/22/2022   Gastric ulcer 02/11/2022   Laryngopharyngeal reflux (LPR) 12/26/2021   History of gastric bypass 10/21/2021   Spinal stenosis of lumbar region with neurogenic claudication  08/27/2021   Fall 08/27/2021   Sinusitis 08/11/2021   Allergic rhinitis 08/07/2021   Hospital discharge follow-up 08/02/2021   (HFpEF) heart failure with preserved ejection fraction (HCC) 07/20/2021   Body mass index (BMI) 45.0-49.9, adult (HCC) 02/05/2021   Iron deficiency anemia due to chronic blood loss 10/17/2020   Tension-type headache, not intractable 10/17/2020   High risk medication use 10/11/2020   Acute hip pain, left 09/19/2020   Sciatica 09/11/2020   Dysmenorrhea 06/04/2020   Menorrhagia with irregular cycle 06/04/2020   Anxiety and depression 06/04/2020   Physical deconditioning 05/24/2020   Severe persistent asthma 03/08/2020   GERD (gastroesophageal reflux disease) 03/07/2020   Anxiety 03/07/2020   History of seizures 03/07/2020   Swelling of lower extremity 02/01/2020   Essential hypertension 02/01/2020   Vitamin D deficiency 02/01/2020   Type 2 diabetes mellitus with hyperglycemia, without long-term current use of insulin (HCC) 02/01/2020   Anemia 02/01/2020   Class 3 severe obesity with serious comorbidity and body mass index (BMI) of 45.0 to 49.9 in adult (HCC) 10/22/2019   OSA on CPAP 10/18/2019   Seropositive rheumatoid arthritis (HCC) 10/18/2019    Past Medical History:  Diagnosis Date   Acute respiratory failure with hypoxia (HCC)    Anemia    Asthma    COVID-19 virus infection 05/16/2020   04/27/20 dx covid-19, not hospitalized   Diabetes (HCC)    on Metformin   Dysplastic nevus 09/24/2022   R ant sole - severe, Excised 10/14/22   Eczema    Edema    Heart problem    "something with the arteries on the left side of the heart"; upcoming appt with cardiology for evaluation   HTN (hypertension)    Rheumatoid arthritis (HCC)    on Plaquenil    Seizures (HCC)    last seizure 2019   Sleep apnea    Sleep apnea     Family History  Problem Relation Age of Onset   High blood pressure Mother    Rheum arthritis Mother    Diabetes Father    Diabetes  Sister    Heart Problems Sister    Diabetes Brother    Diabetes Paternal Grandmother    Diabetes Other        father's side "everybody died from Diabetes"   High blood pressure Other        mother's side, multiple siblings with this    Heart attack Other        family member on mother's side    Diabetes Paternal Aunt    Seizures Cousin        not sibings to the other cousins with seizures   Breast cancer Cousin    Seizures Cousin        not sibings to the other cousins with seizures   Seizures Cousin        not sibings to the other cousins with seizures  Cervical cancer Maternal Aunt    Breast cancer Cousin    Dementia Maternal Aunt    Asthma Maternal Aunt    Heart Problems Maternal Grandmother    Diabetes Brother    Asthma Daughter    Angioedema Daughter    Asthma Son    Past Surgical History:  Procedure Laterality Date   APPENDECTOMY     CESAREAN SECTION     x3   CHOLECYSTECTOMY  02/17/2022   FOOT SURGERY  2024   GASTRIC BYPASS     LAPAROSCOPIC GASTROTOMY W/ REPAIR OF ULCER  02/17/2022   TUBAL LIGATION  2003   Social History   Social History Narrative   Divorced.Lives with 3 kids.Originally from [place].Came from [place] ,[place] 7 months ago.      02/27/2020   Right handed   Caffeine: none     There is no immunization history on file for this patient.   Objective: Vital Signs: BP 124/81 (BP Location: Left Arm, Patient Position: Sitting, Cuff Size: Large)   Pulse (!) 55   Resp 14   Ht 5\' 4"  (1.626 m)   Wt 151 lb (68.5 kg)   LMP 09/15/2023   BMI 25.92 kg/m    Physical Exam Eyes:     Conjunctiva/sclera: Conjunctivae normal.  Cardiovascular:     Rate and Rhythm: Normal rate and regular rhythm.  Pulmonary:     Effort: Pulmonary effort is normal.     Breath sounds: Normal breath sounds.  Lymphadenopathy:     Cervical: No cervical adenopathy.  Skin:    General: Skin is warm and dry.  Neurological:     Mental Status: She is alert.  Psychiatric:         Mood and Affect: Mood normal.      Musculoskeletal Exam:  Neck full ROM no tenderness Shoulders full ROM no tenderness or swelling Elbows full ROM no tenderness or swelling Wrists full ROM no tenderness or swelling Fingers full ROM no tenderness or swelling Knees full ROM, right knee mildly tender, no palpable effusion Ankles full ROM no tenderness or swelling MTPs full ROM no tenderness or swelling   Investigation: No additional findings.  Imaging: No results found.  Recent Labs: Lab Results  Component Value Date   WBC 4.1 11/07/2022   HGB 7.0 (L) 11/07/2022   PLT 286 11/07/2022   NA 139 11/07/2022   K 3.9 11/07/2022   CL 106 11/07/2022   CO2 27 11/07/2022   GLUCOSE 85 11/07/2022   BUN 8 11/07/2022   CREATININE 0.54 11/07/2022   BILITOT 0.3 11/07/2022   ALKPHOS 85 06/09/2022   AST 12 11/07/2022   ALT 9 11/07/2022   PROT 5.9 (L) 11/07/2022   ALBUMIN 3.2 (L) 06/09/2022   CALCIUM 8.3 (L) 11/07/2022   GFRAA 121 10/11/2020   QFTBGOLDPLUS NEGATIVE 04/29/2023    Speciality Comments: PLQ EYE EXAM 09/12/2021 [place] f/u 2-3 months Enbrel started 01/29/11  Methotrexate Approved Quantity: 4 units per 28 days.  Procedures:  No procedures performed Allergies: Ferumoxytol, Phenytoin, Pecan nut (diagnostic), and Phenylbutazones   Assessment / Plan:     Visit Diagnoses: Seropositive rheumatoid arthritis (HCC) - Plan: Sedimentation rate Rheumatoid arthritis with significant improvement in ankles and knees. Methotrexate effective in reducing hand joint symptoms. Appears to have improvement of fatty liver disease after her bariatric surgery allows for methotrexate dose increase. - Checking sed rate for disease activity monitoring - Increase methotrexate dose to 0.6 mg if liver tests are normal. - Continue orencia 125 mg  [place] weekly - Reassess symptoms and side effects in follow-up visit.  High risk medication use - Orencia 125 mg [place] weekly, methotrexate 0.4 mL [place] weekly, and  folic acid 1 mg daily. - Plan: CBC with Differential/Platelet, Comprehensive metabolic panel with GFR - Check liver enzyme levels today to confirm normalcy.  Knee Pain Chronic knee pain improved but persists. Knee brace requires adjustment due to size changes. Continued brace use advised.    Orders: Orders Placed This Encounter  Procedures   Sedimentation rate   CBC with Differential/Platelet   Comprehensive metabolic panel with GFR   Meds ordered this encounter  Medications   methotrexate 50 MG/2ML injection    Sig: Inject 0.6 mLs (15 mg total) into the skin once a week.    Dispense:  4 mL    Refill:  2   TUBERCULIN SYR 1CC/26GX3/8" 26G X 3/8" 1 ML MISC    Sig: 1 each by Does not apply route once a week.    Dispense:  25 each    Refill:  0   folic acid (FOLVITE) 1 MG tablet    Sig: Take 1 tablet (1 mg total) by mouth daily.    Dispense:  90 tablet    Refill:  3     Follow-Up Instructions: Return in about 3 months (around 12/15/2023) for RA on MTX f/u 3mos.   Matt Song, MD  Note - This record has been created using [employer].  Chart creation errors have been sought, but may not always  have been located. Such creation errors do not reflect on  the standard of medical care.

## 2023-09-07 ENCOUNTER — Other Ambulatory Visit (HOSPITAL_COMMUNITY): Payer: Self-pay

## 2023-09-07 ENCOUNTER — Other Ambulatory Visit: Payer: Self-pay

## 2023-09-07 ENCOUNTER — Encounter: Admitting: Registered Nurse

## 2023-09-07 NOTE — Progress Notes (Signed)
 Specialty Pharmacy Refill Coordination Note  Elizabeth Bautista is a 48 y.o. female contacted today regarding refills of specialty medication(s) Orencia  Patient requested Delivery   Delivery date: 09/10/23   Verified address: [address] ,  [place] [address]  Medication will be filled on 09/09/23.

## 2023-09-15 ENCOUNTER — Encounter: Payer: Self-pay | Admitting: Internal Medicine

## 2023-09-15 ENCOUNTER — Ambulatory Visit: Payer: Medicare Other | Attending: Internal Medicine | Admitting: Internal Medicine

## 2023-09-15 VITALS — BP 124/81 | HR 55 | Resp 14 | Ht 64.0 in | Wt 151.0 lb

## 2023-09-15 DIAGNOSIS — Z79899 Other long term (current) drug therapy: Secondary | ICD-10-CM | POA: Diagnosis present

## 2023-09-15 DIAGNOSIS — K529 Noninfective gastroenteritis and colitis, unspecified: Secondary | ICD-10-CM | POA: Diagnosis not present

## 2023-09-15 DIAGNOSIS — M059 Rheumatoid arthritis with rheumatoid factor, unspecified: Secondary | ICD-10-CM | POA: Insufficient documentation

## 2023-09-15 MED ORDER — TUBERCULIN SYRINGE 26G X 3/8" 1 ML MISC: 1.0000 | 0 refills | Status: DC

## 2023-09-15 MED ORDER — FOLIC ACID 1 MG PO TABS: 1.0000 mg | ORAL_TABLET | Freq: Every day | ORAL | 3 refills | Status: DC

## 2023-09-15 MED ORDER — METHOTREXATE SODIUM CHEMO INJECTION 50 MG/2ML: 15.0000 mg | INTRAMUSCULAR | 2 refills | Status: DC

## 2023-09-16 LAB — COMPREHENSIVE METABOLIC PANEL WITH GFR
AG Ratio: 1.9 (calc) (ref 1.0–2.5)
ALT: 24 U/L (ref 6–29)
AST: 27 U/L (ref 10–35)
Albumin: 3.7 g/dL (ref 3.6–5.1)
Alkaline phosphatase (APISO): 194 U/L — ABNORMAL HIGH (ref 31–125)
BUN/Creatinine Ratio: 17 (calc) (ref 6–22)
BUN: 8 mg/dL (ref 7–25)
CO2: 27 mmol/L (ref 20–32)
Calcium: 8 mg/dL — ABNORMAL LOW (ref 8.6–10.2)
Chloride: 110 mmol/L (ref 98–110)
Creat: 0.47 mg/dL — ABNORMAL LOW (ref 0.50–0.99)
Globulin: 2 g/dL (ref 1.9–3.7)
Glucose, Bld: 68 mg/dL (ref 65–99)
Potassium: 4.5 mmol/L (ref 3.5–5.3)
Sodium: 140 mmol/L (ref 135–146)
Total Bilirubin: 0.3 mg/dL (ref 0.2–1.2)
Total Protein: 5.7 g/dL — ABNORMAL LOW (ref 6.1–8.1)
eGFR: 117 mL/min/{1.73_m2} (ref >=60)

## 2023-09-16 LAB — CBC WITH DIFFERENTIAL/PLATELET
Absolute Lymphocytes: 1668 {cells}/uL (ref 850–3900)
Absolute Monocytes: 310 {cells}/uL (ref 200–950)
Basophils Absolute: 22 {cells}/uL (ref 0–200)
Basophils Relative: 0.5 %
Eosinophils Absolute: 129 {cells}/uL (ref 15–500)
Eosinophils Relative: 3 %
HCT: 33.4 % — ABNORMAL LOW (ref 35.0–45.0)
Hemoglobin: 10.5 g/dL — ABNORMAL LOW (ref 11.7–15.5)
MCH: 26.7 pg — ABNORMAL LOW (ref 27.0–33.0)
MCHC: 31.4 g/dL — ABNORMAL LOW (ref 32.0–36.0)
MCV: 85 fL (ref 80.0–100.0)
MPV: 10.7 fL (ref 7.5–12.5)
Monocytes Relative: 7.2 %
Neutro Abs: 2172 {cells}/uL (ref 1500–7800)
Neutrophils Relative %: 50.5 %
Platelets: 204 10*3/uL (ref 140–400)
RBC: 3.93 10*6/uL (ref 3.80–5.10)
RDW: 19.9 % — ABNORMAL HIGH (ref 11.0–15.0)
Total Lymphocyte: 38.8 %
WBC: 4.3 10*3/uL (ref 3.8–10.8)

## 2023-09-16 LAB — SEDIMENTATION RATE: Sed Rate: 6 mm/h (ref 0–20)

## 2023-09-21 ENCOUNTER — Encounter: Admitting: Registered Nurse

## 2023-09-21 ENCOUNTER — Other Ambulatory Visit: Payer: Self-pay

## 2023-09-21 MED ORDER — BACLOFEN 10 MG PO TABS: 5.0000 mg | ORAL_TABLET | Freq: Three times a day (TID) | ORAL | 1 refills | Status: DC

## 2023-09-29 ENCOUNTER — Encounter: Attending: Physical Medicine and Rehabilitation | Admitting: Registered Nurse

## 2023-09-29 ENCOUNTER — Encounter: Payer: Self-pay | Admitting: Registered Nurse

## 2023-09-29 ENCOUNTER — Ambulatory Visit: Admitting: Dermatology

## 2023-09-29 VITALS — BP 138/84 | HR 63 | Ht 64.0 in | Wt 151.0 lb

## 2023-09-29 DIAGNOSIS — M797 Fibromyalgia: Secondary | ICD-10-CM | POA: Insufficient documentation

## 2023-09-29 DIAGNOSIS — Z79891 Long term (current) use of opiate analgesic: Secondary | ICD-10-CM | POA: Insufficient documentation

## 2023-09-29 DIAGNOSIS — Z5181 Encounter for therapeutic drug level monitoring: Secondary | ICD-10-CM | POA: Insufficient documentation

## 2023-09-29 DIAGNOSIS — G8929 Other chronic pain: Secondary | ICD-10-CM | POA: Diagnosis present

## 2023-09-29 DIAGNOSIS — M48062 Spinal stenosis, lumbar region with neurogenic claudication: Secondary | ICD-10-CM | POA: Insufficient documentation

## 2023-09-29 DIAGNOSIS — M5442 Lumbago with sciatica, left side: Secondary | ICD-10-CM | POA: Insufficient documentation

## 2023-09-29 DIAGNOSIS — G894 Chronic pain syndrome: Secondary | ICD-10-CM | POA: Insufficient documentation

## 2023-09-29 DIAGNOSIS — M5441 Lumbago with sciatica, right side: Secondary | ICD-10-CM | POA: Insufficient documentation

## 2023-09-29 NOTE — Progress Notes (Signed)
 Subjective:    Patient ID: Elizabeth Bautista, female    DOB: 06-May-1976, 48 y.o.   MRN: [id number]  HPI: Elizabeth Bautista is a 48 y.o. female who returns for follow up appointment for chronic pain and medication refill. She states her pain is located in her lower back, bilateral lower extremities and Fibro Pain. Also reports 1- 2 hour of relief. She rates her pain 8. Her current exercise regime is walking and performing stretching exercises.  Elizabeth Bautista  equivalent is 45.00 MME.   Oral Swab was Performed on 09/29/2023, it was consistent.     Pain Inventory Average Pain 10 Pain Right Now 8 My pain is sharp, burning, stabbing, tingling, and aching  In the last 24 hours, has pain interfered with the following? General activity 10 Relation with others 10 Enjoyment of life 10 What TIME of day is your pain at its worst? morning  and night Sleep (in general) Poor  Pain is worse with: walking, bending, sitting, standing, and some activites Pain improves with: heat/ice and medication Relief from Meds: 5  Family History  Problem Relation Age of Onset   High blood pressure Mother    Rheum arthritis Mother    Diabetes Father    Diabetes Sister    Heart Problems Sister    Diabetes Brother    Diabetes Paternal Grandmother    Diabetes Other        father's side "everybody died from Diabetes"   High blood pressure Other        mother's side, multiple siblings with this    Heart attack Other        family member on mother's side    Diabetes Paternal Aunt    Seizures Cousin        not sibings to the other cousins with seizures   Breast cancer Cousin    Seizures Cousin        not sibings to the other cousins with seizures   Seizures Cousin        not sibings to the other cousins with seizures   Cervical cancer Maternal Aunt    Breast cancer Cousin    Dementia Maternal Aunt    Asthma Maternal Aunt    Heart Problems Maternal Grandmother    Diabetes Brother    Asthma  Daughter    Angioedema Daughter    Asthma Son    Social History   Socioeconomic History   Marital status: Single    Spouse name: Not on file   Number of children: 5   Years of education: Not on file   Highest education level: 9th grade  Occupational History   Not on file  Tobacco Use   Smoking status: Former    Current packs/day: 0.00    Average packs/day: 0.1 packs/day for 10.0 years (0.5 ttl pk-yrs)    Types: Cigarettes    Start date: 06/03/2003    Quit date: 06/02/2013    Years since quitting: 10.3    Passive exposure: Never   Smokeless tobacco: Never   Tobacco comments:    during the 10 years of smoking, smoked 2-3 cigarettes/day  Vaping Use   Vaping status: Never Used  Substance and Sexual Activity   Alcohol use: Not Currently   Drug use: Not Currently   Sexual activity: Not Currently    Birth control/protection: Surgical    Comment: tubal  Other Topics Concern   Not on file  Social History Narrative   Divorced.Lives with  3 kids.Originally from [place].Came from [place] ,[place] 7 months ago.      02/27/2020   Right handed   Caffeine: none    Social Drivers of Health   Financial Resource Strain: High Risk (07/01/2023)   Received from [facility]   Overall Financial Resource Strain (CARDIA)    Difficulty of Paying Living Expenses: Very hard  Food Insecurity: Food Insecurity Present (07/01/2023)   Received from [facility]   Hunger Vital Sign    Worried About Running Out of Food in the Last Year: Often true    Ran Out of Food in the Last Year: Often true  Transportation Needs: Unmet Transportation Needs (07/01/2023)   Received from [facility] - Transportation    Lack of Transportation (Medical): Yes    Lack of Transportation (Non-Medical): Yes  Physical Activity: Inactive (07/01/2023)   [facility]   Exercise Vital Sign    Days of Exercise per Week: 0 days    Minutes of Exercise per Session: 0 min  Stress: Stress Concern Present  (07/01/2023)   Received from [facility] Occupational [facility] - Occupational Stress Questionnaire    Feeling of Stress : Very much  Social Connections: Socially Isolated (07/01/2023)   Received from [facility]   Social Connection and Isolation Panel [NHANES]    Frequency of Communication with Friends and Family: Once a week    Frequency of Social Gatherings with Friends and Family: Never    Attends Religious Services: Never    Database administrator or Organizations: No    Attends Engineer, structural: Never    Marital Status: Divorced   Past Surgical History:  Procedure Laterality Date   APPENDECTOMY     CESAREAN SECTION     x3   CHOLECYSTECTOMY  02/17/2022   FOOT SURGERY  2024   GASTRIC BYPASS     LAPAROSCOPIC GASTROTOMY W/ REPAIR OF ULCER  02/17/2022   TUBAL LIGATION  2003   Past Surgical History:  Procedure Laterality Date   APPENDECTOMY     CESAREAN SECTION     x3   CHOLECYSTECTOMY  02/17/2022   FOOT SURGERY  2024   GASTRIC BYPASS     LAPAROSCOPIC GASTROTOMY W/ REPAIR OF ULCER  02/17/2022   TUBAL LIGATION  2003   Past Medical History:  Diagnosis Date   Acute respiratory failure with hypoxia (HCC)    Anemia    Asthma    COVID-19 virus infection 05/16/2020   04/27/20 dx covid-19, not hospitalized   Diabetes (HCC)    on Metformin    Dysplastic nevus 09/24/2022   R ant sole - severe, Excised 10/14/22   Eczema    Edema    Heart problem    "something with the arteries on the left side of the heart"; upcoming appt with cardiology for evaluation   HTN (hypertension)    Rheumatoid arthritis (HCC)    on Plaquenil     Seizures (HCC)    last seizure 2019   Sleep apnea    Sleep apnea    BP 138/84   Pulse 63   Ht 5\' 4"  (1.626 m)   Wt 151 lb (68.5 kg)   LMP 09/15/2023   SpO2 100%   BMI 25.92 kg/m   Opioid Risk Score:   Fall Risk Score:  `1  Depression screen [facility] 2/9     09/29/2023    2:04 PM 06/18/2023   10:51 AM  04/13/2023  9:28 AM 09/22/2022    9:42 AM 08/19/2022   10:07 AM 08/14/2022    8:38 AM 05/27/2022    9:22 AM  Depression screen PHQ 2/9  Decreased Interest 3 3 3  0 0 3 1  Down, Depressed, Hopeless 3 3 3  0 0 3 1  PHQ - 2 Score 6 6 6  0 0 6 2  Altered sleeping  3 3   3    Tired, decreased energy  3 3   3    Change in appetite  1 3   3    Feeling bad or failure about yourself   3 3   3    Trouble concentrating  3 3   3    Moving slowly or fidgety/restless  3 0   2   Suicidal thoughts  0 0   2   PHQ-9 Score  22 21   25    Difficult doing work/chores  Extremely dIfficult Extremely dIfficult         Review of Systems  Musculoskeletal:  Positive for arthralgias and myalgias.  All other systems reviewed and are negative.      Objective:   Physical Exam Vitals and nursing note reviewed.  Constitutional:      Appearance: Normal appearance.  Neck:     Comments: Cervical Paraspinal Tenderness: C-5-C-6 Cardiovascular:     Rate and Rhythm: Normal rate and regular rhythm.     Pulses: Normal pulses.     Heart sounds: Normal heart sounds.  Pulmonary:     Effort: Pulmonary effort is normal.     Breath sounds: Normal breath sounds.  Musculoskeletal:     Comments: Normal Muscle Bulk and Muscle Testing Reveals:  Upper Extremities: Right: Full ROM and Muscle Strength 5/5 Left Upper Extremity: Decreased ROM 30 Degrees And Muscle Strength 4/5  Thoracic and Lumbar Hypersensitivity Lower Extremities: Right: ull ROM and Muscle Strength 5/5 Left Lower Extremity: Decreased ROM and Muscle Strength  4/5 Arises from Table slowly using a cane Antalgic Gait     Skin:    General: Skin is warm and dry.  Neurological:     Mental Status: She is alert and oriented to person, place, and time.  Psychiatric:        Mood and Affect: Mood normal.        Behavior: Behavior normal.         Assessment & Plan:  Chronic Bilateral Low Back Pain with Bilateral Sciatica: Continue Current medication regimen.  Continue to Monitor.  2. Fibromyalgia Syndrome: Continue HEP as Tolerated. Continue current medication regimen. Continue to Monitor.  3. Chronic Pain Syndrome: Continue Oxycodone  10 mg three times a day as needed or pain #90. We will continue the opioid monitoring program, this consists of regular clinic visits, examinations, urine drug screen, pill counts as well as use of [place]  Controlled Substance Reporting system. A 12 month History has been reviewed on the [place]  Controlled Substance Reporting System on  09/29/2023  F/U in 1 month

## 2023-10-02 ENCOUNTER — Other Ambulatory Visit: Payer: Self-pay

## 2023-10-02 NOTE — Progress Notes (Signed)
 Specialty Pharmacy Refill Coordination Note  Elizabeth Bautista is a 48 y.o. female contacted today regarding refills of specialty medication(s) Abatacept  (Orencia  [employer])   Patient requested Delivery   Delivery date: 10/07/23   Verified address: [address] [place] [address]   Medication will be filled on 05.06.25.

## 2023-10-03 LAB — DRUG TOX MONITOR 1 W/CONF, ORAL FLD

## 2023-10-03 LAB — DRUG TOX ALC METAB W/CON, ORAL FLD: Alcohol Metabolite: NEGATIVE ng/mL (ref <25)

## 2023-10-09 ENCOUNTER — Encounter (HOSPITAL_COMMUNITY): Payer: Self-pay

## 2023-10-09 ENCOUNTER — Other Ambulatory Visit (HOSPITAL_COMMUNITY): Payer: Self-pay | Admitting: Orthopaedic Surgery

## 2023-10-09 ENCOUNTER — Ambulatory Visit (HOSPITAL_COMMUNITY): Admission: RE | Admit: 2023-10-09 | Source: Ambulatory Visit

## 2023-10-09 DIAGNOSIS — M79604 Pain in right leg: Secondary | ICD-10-CM

## 2023-10-12 ENCOUNTER — Ambulatory Visit: Admitting: Dermatology

## 2023-10-17 ENCOUNTER — Other Ambulatory Visit: Payer: Self-pay | Admitting: Internal Medicine

## 2023-10-17 DIAGNOSIS — M059 Rheumatoid arthritis with rheumatoid factor, unspecified: Secondary | ICD-10-CM

## 2023-10-27 ENCOUNTER — Other Ambulatory Visit: Payer: Self-pay | Admitting: Internal Medicine

## 2023-10-27 ENCOUNTER — Other Ambulatory Visit: Payer: Self-pay

## 2023-10-27 DIAGNOSIS — M059 Rheumatoid arthritis with rheumatoid factor, unspecified: Secondary | ICD-10-CM

## 2023-10-27 DIAGNOSIS — Z79899 Other long term (current) drug therapy: Secondary | ICD-10-CM

## 2023-10-27 MED ORDER — ORENCIA CLICKJECT 125 MG/ML ~~LOC~~ SOAJ
125.0000 mg | SUBCUTANEOUS | 0 refills | Status: DC
Start: 2023-10-27 — End: 2024-01-14
  Filled 2023-10-27: qty 4, 28d supply, fill #0
  Filled 2023-11-25: qty 4, 28d supply, fill #1
  Filled 2023-12-25: qty 4, 28d supply, fill #2

## 2023-10-27 NOTE — Telephone Encounter (Signed)
 Last Fill: 08/10/2023  Labs: 09/15/2023  Creat 0.47 Calcium 8.0 Total Protein 5.7 Alkaline Phosphatase 194 Hemoglobin 10.5 HCT 33.4 MCH 26.7 MCHC 31.4 RDW 19.9  TB Gold: 04/29/2023 Negative   Next Visit: 12/15/2023  Last Visit: 09/15/2023  [id number] rheumatoid arthritis (HCC)   Current Dose per office note 09/15/2023: orencia  125 mg [place] weekly   Okay to refill Orencia ?

## 2023-10-27 NOTE — Progress Notes (Signed)
 Specialty Pharmacy Ongoing Clinical Assessment Note  Elizabeth Bautista is a 48 y.o. female who is being followed by the specialty pharmacy service for RxSp Rheumatoid Arthritis   Patient's specialty medication(s) reviewed today: Abatacept  (Orencia  ClickJect)   Missed doses in the last 4 weeks: 0   Patient/Caregiver did not have any additional questions or concerns.   Therapeutic benefit summary: Patient is achieving benefit   Adverse events/side effects summary: No adverse events/side effects   Patient's therapy is appropriate to: Continue    Goals Addressed             This Visit's Progress    Reduce signs and symptoms   Improving    Patient is not on track and improving. Patient will maintain adherence and be monitored by provider to determine if a change in treatment plan is warranted. At office visit on 4/15, patient reported that pain and swelling in her ankles has subsided significantly and knee pain is better.         Follow up: 6 months  [facility]

## 2023-10-27 NOTE — Progress Notes (Signed)
 Specialty Pharmacy Refill Coordination Note  Elizabeth Bautista is a 48 y.o. female contacted today regarding refills of specialty medication(s) Abatacept  (Orencia  ClickJect)   Patient requested Delivery   Delivery date: 10/30/23   Verified address: [address] [place] [address]   Medication will be filled on 10/29/23. This fill date is pending response to refill request from provider. Patient is aware and if they have not received fill by intended date they must follow up with pharmacy.

## 2023-10-28 ENCOUNTER — Other Ambulatory Visit: Payer: Self-pay

## 2023-10-29 ENCOUNTER — Encounter: Attending: Physical Medicine and Rehabilitation | Admitting: Registered Nurse

## 2023-10-29 ENCOUNTER — Other Ambulatory Visit: Payer: Self-pay

## 2023-10-29 DIAGNOSIS — M5441 Lumbago with sciatica, right side: Secondary | ICD-10-CM | POA: Insufficient documentation

## 2023-10-29 DIAGNOSIS — M797 Fibromyalgia: Secondary | ICD-10-CM | POA: Insufficient documentation

## 2023-10-29 DIAGNOSIS — M5442 Lumbago with sciatica, left side: Secondary | ICD-10-CM | POA: Insufficient documentation

## 2023-10-29 DIAGNOSIS — Z5181 Encounter for therapeutic drug level monitoring: Secondary | ICD-10-CM | POA: Insufficient documentation

## 2023-10-29 DIAGNOSIS — Z79891 Long term (current) use of opiate analgesic: Secondary | ICD-10-CM | POA: Insufficient documentation

## 2023-10-29 DIAGNOSIS — M48062 Spinal stenosis, lumbar region with neurogenic claudication: Secondary | ICD-10-CM | POA: Insufficient documentation

## 2023-10-29 DIAGNOSIS — G894 Chronic pain syndrome: Secondary | ICD-10-CM | POA: Insufficient documentation

## 2023-10-29 DIAGNOSIS — G8929 Other chronic pain: Secondary | ICD-10-CM | POA: Insufficient documentation

## 2023-10-29 NOTE — Progress Notes (Deleted)
 Subjective:    Patient ID: Elizabeth Bautista, female    DOB: [birth year], 48 y.o.   MRN: [id number]  HPI  Pain Inventory Average Pain {NUMBERS; 0-10:5044} Pain Right Now {NUMBERS; 0-10:5044} My pain is {PAIN DESCRIPTION:21022940}  In the last 24 hours, has pain interfered with the following? General activity {NUMBERS; 0-10:5044} Relation with others {NUMBERS; 0-10:5044} Enjoyment of life {NUMBERS; 0-10:5044} What TIME of day is your pain at its worst? {time of day:24191} Sleep (in general) {BHH GOOD/FAIR/POOR:22877}  Pain is worse with: {ACTIVITIES:21022942} Pain improves with: {PAIN IMPROVES WUJW:11914782} Relief from Meds: {NUMBERS; 0-10:5044}  Family History  Problem Relation Age of Onset   High blood pressure Mother    Rheum arthritis Mother    Diabetes Father    Diabetes Sister    Heart Problems Sister    Diabetes Brother    Diabetes Paternal Grandmother    Diabetes Other        father's side "everybody died from Diabetes"   High blood pressure Other        mother's side, multiple siblings with this    Heart attack Other        family member on mother's side    Diabetes Paternal Aunt    Seizures Cousin        not sibings to the other cousins with seizures   Breast cancer Cousin    Seizures Cousin        not sibings to the other cousins with seizures   Seizures Cousin        not sibings to the other cousins with seizures   Cervical cancer Maternal Aunt    Breast cancer Cousin    Dementia Maternal Aunt    Asthma Maternal Aunt    Heart Problems Maternal Grandmother    Diabetes Brother    Asthma Daughter    Angioedema Daughter    Asthma Son    Social History   Socioeconomic History   Marital status: Single    Spouse name: Not on file   Number of children: 5   Years of education: Not on file   Highest education level: 9th grade  Occupational History   Not on file  Tobacco Use   Smoking status: Former    Current packs/day: 0.00    Average  packs/day: 0.1 packs/day for 10.0 years (0.5 ttl pk-yrs)    Types: Cigarettes    Start date: 06/03/2003    Quit date: 06/02/2013    Years since quitting: 10.4    Passive exposure: Never   Smokeless tobacco: Never   Tobacco comments:    during the 10 years of smoking, smoked 2-3 cigarettes/day  Vaping Use   Vaping status: Never Used  Substance and Sexual Activity   Alcohol use: Not Currently   Drug use: Not Currently   Sexual activity: Not Currently    Birth control/protection: Surgical    Comment: tubal  Other Topics Concern   Not on file  Social History Narrative   Divorced.Lives with 3 kids.Originally from [place].Came from [place] ,[place] 7 months ago.      02/27/2020   Right handed   Caffeine: none    Social Drivers of Health   Financial Resource Strain: High Risk (07/01/2023)   Received from [facility]   Overall Financial Resource Strain (CARDIA)    Difficulty of Paying Living Expenses: Very hard  Food Insecurity: Food Insecurity Present (07/01/2023)   Received from [facility]   Hunger Vital Sign    Worried  About Running Out of Food in the Last Year: Often true    Ran Out of Food in the Last Year: Often true  Transportation Needs: Unmet Transportation Needs (07/01/2023)   Received from [facility] - Transportation    Lack of Transportation (Medical): Yes    Lack of Transportation (Non-Medical): Yes  Physical Activity: Inactive (07/01/2023)   Received from [facility]   Exercise Vital Sign    Days of Exercise per Week: 0 days    Minutes of Exercise per Session: 0 min  Stress: Stress Concern Present (07/01/2023)   Received from [facility] - Occupational Stress Questionnaire    Feeling of Stress : Very much  Social Connections: Socially Isolated (07/01/2023)   Received from [facility]   Social Connection and Isolation Panel [NHANES]    Frequency of Communication with Friends and Family: Once a week     Frequency of Social Gatherings with Friends and Family: Never    Attends Religious Services: Never    Database administrator or Organizations: No    Attends Engineer, structural: Never    Marital Status: Divorced   Past Surgical History:  Procedure Laterality Date   APPENDECTOMY     CESAREAN SECTION     x3   CHOLECYSTECTOMY  02/17/2022   FOOT SURGERY  2024   GASTRIC BYPASS     LAPAROSCOPIC GASTROTOMY W/ REPAIR OF ULCER  02/17/2022   TUBAL LIGATION  2003   Past Surgical History:  Procedure Laterality Date   APPENDECTOMY     CESAREAN SECTION     x3   CHOLECYSTECTOMY  02/17/2022   FOOT SURGERY  2024   GASTRIC BYPASS     LAPAROSCOPIC GASTROTOMY W/ REPAIR OF ULCER  02/17/2022   TUBAL LIGATION  2003   Past Medical History:  Diagnosis Date   Acute respiratory failure with hypoxia (HCC)    Anemia    Asthma    COVID-19 virus infection 05/16/2020   04/27/20 dx covid-19, not hospitalized   Diabetes (HCC)    on Metformin    Dysplastic nevus 09/24/2022   R ant sole - severe, 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  Opioid Risk Score:   Fall Risk Score:  `1  Depression screen [facility] 2/9     09/29/2023    2:04 PM 06/18/2023   10:51 AM 04/13/2023    9:28 AM 09/22/2022    9:42 AM 08/19/2022   10:07 AM 08/14/2022    8:38 AM 05/27/2022    9:22 AM  Depression screen PHQ 2/9  Decreased Interest 3 3 3  0 0 3 1  Down, Depressed, Hopeless 3 3 3  0 0 3 1  PHQ - 2 Score 6 6 6  0 0 6 2  Altered sleeping  3 3   3    Tired, decreased energy  3 3   3    Change in appetite  1 3   3    Feeling bad or failure about yourself   3 3   3  Trouble concentrating  3 3   3    Moving slowly  or fidgety/restless  3 0   2   Suicidal thoughts  0 0   2   PHQ-9 Score  22 21   25    Difficult doing work/chores  Extremely dIfficult Extremely dIfficult         Review of Systems     Objective:   Physical Exam        Assessment & Plan:

## 2023-11-13 NOTE — Progress Notes (Deleted)
 Subjective:    Patient ID: Elizabeth Bautista, female    DOB: Apr 30, 1976, 48 y.o.   MRN: [id number]  HPI   Pain Inventory Average Pain {NUMBERS; 0-10:5044} Pain Right Now {NUMBERS; 0-10:5044} My pain is {PAIN DESCRIPTION:21022940}  In the last 24 hours, has pain interfered with the following? General activity {NUMBERS; 0-10:5044} Relation with others {NUMBERS; 0-10:5044} Enjoyment of life {NUMBERS; 0-10:5044} What TIME of day is your pain at its worst? {time of day:24191} Sleep (in general) {BHH GOOD/FAIR/POOR:22877}  Pain is worse with: {ACTIVITIES:21022942} Pain improves with: {PAIN IMPROVES WUJW:11914782} Relief from Meds: {NUMBERS; 0-10:5044}  Family History  Problem Relation Age of Onset   High blood pressure Mother    Rheum arthritis Mother    Diabetes Father    Diabetes Sister    Heart Problems Sister    Diabetes Brother    Diabetes Paternal Grandmother    Diabetes Other        father's side everybody died from Diabetes   High blood pressure Other        mother's side, multiple siblings with this    Heart attack Other        family member on mother's side    Diabetes Paternal Aunt    Seizures Cousin        not sibings to the other cousins with seizures   Breast cancer Cousin    Seizures Cousin        not sibings to the other cousins with seizures   Seizures Cousin        not sibings to the other cousins with seizures   Cervical cancer Maternal Aunt    Breast cancer Cousin    Dementia Maternal Aunt    Asthma Maternal Aunt    Heart Problems Maternal Grandmother    Diabetes Brother    Asthma Daughter    Angioedema Daughter    Asthma Son    Social History   Socioeconomic History   Marital status: Single    Spouse name: Not on file   Number of children: 5   Years of education: Not on file   Highest education level: 9th grade  Occupational History   Not on file  Tobacco Use   Smoking status: Former    Current packs/day: 0.00    Average  packs/day: 0.1 packs/day for 10.0 years (0.5 ttl pk-yrs)    Types: Cigarettes    Start date: 06/03/2003    Quit date: 06/02/2013    Years since quitting: 10.4    Passive exposure: Never   Smokeless tobacco: Never   Tobacco comments:    during the 10 years of smoking, smoked 2-3 cigarettes/day  Vaping Use   Vaping status: Never Used  Substance and Sexual Activity   Alcohol use: Not Currently   Drug use: Not Currently   Sexual activity: Not Currently    Birth control/protection: Surgical    Comment: tubal  Other Topics Concern   Not on file  Social History Narrative   Divorced.Lives with 3 kids.Originally from [place].Came from [place] ,[place] 7 months ago.      02/27/2020   Right handed   Caffeine: none    Social Drivers of Health   Financial Resource Strain: High Risk (07/01/2023)   Received from [facility]   Overall Financial Resource Strain (CARDIA)    Difficulty of Paying Living Expenses: Very hard  Food Insecurity: Food Insecurity Present (07/01/2023)   Received from [facility]   Hunger Vital Sign  Within the past 12 months, you worried that your food would run out before you got the money to buy more.: Often true    Within the past 12 months, the food you bought just didn't last and you didn't have money to get more.: Often true  Transportation Needs: Unmet Transportation Needs (07/01/2023)   Received from [facility] - Transportation    Lack of Transportation (Medical): Yes    Lack of Transportation (Non-Medical): Yes  Physical Activity: Inactive (07/01/2023)   Received from [facility]   Exercise Vital Sign    On average, how many days per week do you engage in moderate to strenuous exercise (like a brisk walk)?: 0 days    On average, how many minutes do you engage in exercise at this level?: 0 min  Stress: Stress Concern Present (07/01/2023)   Received from [facility] - Occupational Stress Questionnaire     Feeling of Stress : Very much  Social Connections: Socially Isolated (07/01/2023)   Received from [facility]   Social Connection and Isolation Panel    In a typical week, how many times do you talk on the phone with family, friends, or neighbors?: Once a week    How often do you get together with friends or relatives?: Never    How often do you attend church or religious services?: Never    Do you belong to any clubs or organizations such as church groups, unions, fraternal or athletic groups, or school groups?: No    How often do you attend meetings of the clubs or organizations you belong to?: Never    Are you married, widowed, divorced, separated, never married, or living with a partner?: Divorced   Past Surgical History:  Procedure Laterality Date   APPENDECTOMY     CESAREAN SECTION     x3   CHOLECYSTECTOMY  02/17/2022   FOOT SURGERY  2024   GASTRIC BYPASS     LAPAROSCOPIC GASTROTOMY W/ REPAIR OF ULCER  02/17/2022   TUBAL LIGATION  2003   Past Surgical History:  Procedure Laterality Date   APPENDECTOMY     CESAREAN SECTION     x3   CHOLECYSTECTOMY  02/17/2022   FOOT SURGERY  2024   GASTRIC BYPASS     LAPAROSCOPIC GASTROTOMY W/ REPAIR OF ULCER  02/17/2022   TUBAL LIGATION  2003   Past Medical History:  Diagnosis Date   Acute respiratory failure with hypoxia (HCC)    Anemia    Asthma    COVID-19 virus infection 05/16/2020   04/27/20 dx covid-19, not hospitalized   Diabetes (HCC)    on Metformin    Dysplastic nevus 09/24/2022   R ant sole - severe, Excised 10/14/22   Eczema    Edema    Heart problem    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  Opioid Risk Score:   Fall Risk Score:  `1  Depression screen [facility]  2/9     09/29/2023    2:04 PM 06/18/2023   10:51 AM 04/13/2023    9:28 AM 09/22/2022    9:42 AM 08/19/2022  10:07 AM 08/14/2022    8:38 AM 05/27/2022    9:22 AM  Depression screen PHQ 2/9  Decreased Interest 3 3 3  0 0 3 1  Down, Depressed, Hopeless 3 3 3  0 0 3 1  PHQ - 2 Score 6 6 6  0 0 6 2  Altered sleeping  3 3   3    Tired, decreased energy  3 3   3    Change in appetite  1 3   3    Feeling bad or failure about yourself   3 3   3    Trouble concentrating  3 3   3    Moving slowly or fidgety/restless  3 0   2   Suicidal thoughts  0 0   2   PHQ-9 Score  22 21   25    Difficult doing work/chores  Extremely dIfficult Extremely dIfficult        Review of Systems     Objective:   Physical Exam        Assessment & Plan:

## 2023-11-16 ENCOUNTER — Telehealth: Payer: Self-pay | Admitting: Registered Nurse

## 2023-11-16 ENCOUNTER — Encounter: Admitting: Registered Nurse

## 2023-11-16 MED ORDER — OXYCODONE HCL 10 MG PO TABS: 10.0000 mg | ORAL_TABLET | Freq: Three times a day (TID) | ORAL | 0 refills | Status: DC | PRN

## 2023-11-16 NOTE — Telephone Encounter (Signed)
 PMP was Reviewed.  UDS was Reviewed Whitney called Ms. Artlet, she is aware she must keep her appointments to receive refills on Oxycodone . Will send a partial fill till her next appointment.

## 2023-11-16 NOTE — Telephone Encounter (Signed)
 She did request a refill and I have her rescheduled. I let her know it would have to be a in office visit in order to receive meds

## 2023-11-19 ENCOUNTER — Telehealth: Payer: Self-pay | Admitting: Registered Nurse

## 2023-11-19 ENCOUNTER — Telehealth: Payer: Self-pay

## 2023-11-19 NOTE — Telephone Encounter (Signed)
 Dr Alessandra Ancona note was reviewed. Last year you documented NSAID was contraindicated due to her GI Distress. She is reporting she is not having the same issue. She would like to have Ibuprofen  prescribed, awaiting your input . She states she wants to use the ibuprofen   for pain relief in her joints , Bilateral shoulders, Bilateral knees and generalized joint pain. Her last Oxycodone  was filled in March. She is using the Oxycodone  sparingly. She is aware you are on vacation. She will be picking up her Oxycodone  today, when her son will be available to pay the co-pay for her.  Will await your response.

## 2023-11-19 NOTE — Telephone Encounter (Signed)
 Patient wanting a medication refill on Ib profen 600mg --she said that Dr. Alessandra Ancona had prescribed it a while ago and her pain has increased.  She said she went to Urgent Care and they wouldn't give it to her, that she needed to call our office.  Please call patient.

## 2023-11-19 NOTE — Telephone Encounter (Signed)
 Patient contacted the office and left a message inquiring if Dr. Rodell Citrin could send in a prescription of Ibuprofen  600 mg. Patient states she is having more inflammation in her hands and shoulders. Patient states she has taken the ibuprofen  in the past and it had helped her a lot. Please advise.

## 2023-11-23 ENCOUNTER — Other Ambulatory Visit: Payer: Self-pay

## 2023-11-24 ENCOUNTER — Telehealth: Payer: Self-pay | Admitting: Registered Nurse

## 2023-11-24 MED ORDER — IBUPROFEN 800 MG PO TABS
800.0000 mg | ORAL_TABLET | Freq: Three times a day (TID) | ORAL | 0 refills | Status: DC | PRN
Start: 2023-11-24 — End: 2024-04-26

## 2023-11-24 NOTE — Telephone Encounter (Signed)
 Spoke with Dr Lorilee, today.  Ms. Seena can resume Ibuprofen .  Ms Amauria reports she hasn't taken the Methotreaxte in the Month of June, due to side effects . She was instructed to call her Rheumatologist, she verbalizes understanding.

## 2023-11-25 ENCOUNTER — Other Ambulatory Visit: Payer: Self-pay

## 2023-11-25 NOTE — Progress Notes (Signed)
 Specialty Pharmacy Refill Coordination Note  Elizabeth Bautista is a 48 y.o. female contacted today regarding refills of specialty medication(s) Abatacept  (Orencia  ClickJect)   Patient requested Delivery   Delivery date: 11/27/23   Verified address: [address]   [place] [address]   Medication will be filled on 11/26/23.

## 2023-12-02 NOTE — Progress Notes (Deleted)
 Office Visit Note  Patient: Elizabeth Bautista             Date of Birth: 01-Jun-1976           MRN: [id number]             PCP: Vonzell Ronnald ONEIDA DEVONNA Referring: Vonzell Ronnald ONEIDA DEVONNA Visit Date: 12/15/2023   Subjective:  No chief complaint on file.   History of Present Illness: Elizabeth Bautista is a 48 y.o. female here for follow up seropositive rheumatoid arthritis on Orencia  125 mg [place] weekly, methotrexate  0.6 mL [place] weekly, and folic acid  1 mg daily.    Previous HPI 09/15/2023 Elizabeth Bautista is a 48 y.o. female here for follow up seropositive rheumatoid arthritis on Orencia  125 mg [place] weekly, methotrexate  0.4 mL [place] weekly, and folic acid  1 mg daily.    She has experienced some improvement with methotrexate , although not complete. Her hands remain the most affected area, with persistent swelling, particularly in the right hand knuckles. The swelling has decreased but is still present, impacting her daily activities. Her ankles also swell but are less painful than before. The pain in her ankles has subsided significantly compared to her hands.   She denies any side effects from methotrexate  such as nausea or diarrhea, but has experienced weight gain, which she attributes to another medication for depression. She is currently on a low dose of methotrexate , 0.4 mg, due to previous concerns about liver enzyme levels, which have since normalized following weight loss.   She reports ongoing knee pain, though less intense than previously. She uses a knee brace, which requires adjustment due to size changes, and notes that she does not feel the need for it as much anymore. She no longer uses a cane when not in pain.   No recent respiratory infections or significant illnesses. She reports cold hands and feet without color changes and experiences morning stiffness in her joints.         Previous HPI 04/29/2023 Elizabeth Bautista is a 48 y.o. female with seropositive rheumatoid  arthritis on Orencia  125 mg [place] weekly here due to increased joint pain, particularly in the ankles, over the past month. The pain is described as so severe that it impedes the ability to walk, with some days being completely incapacitating. The patient also reports several falls due to the pain. The pain radiates from the ankles to the underside of the foot and is accompanied by swelling. The patient notes that the right ankle has sometimes been more inflamed than the other, although both are painful.   Pain management has recently switched the patient to oxycodone , which provides temporary relief but does not alleviate the persistent daily pain. Physical therapy has been attempted but was deemed ineffective due to the patient's inability to walk or move the ankles. The patient expressed a preference for aqua therapy, which previously provided more relief, but has been unable to continue due to ongoing diarrhea issues since bariatric surgery   In addition to the ankle pain, the patient reports increased swelling and pain in the right hand, to the point of being unable to close it. The patient continues to take Orencia  shots for rheumatoid arthritis management, which were effective prior to the recent increase in pain.   The patient also reports ongoing gastric issues, including persistent diarrhea and vomiting, since a recent surgery. These issues have been associated with malabsorption, leading to nutritional deficiencies and hair loss. The patient was  hospitalized last month due to these issues, and lab results indicated low levels of certain nutrients.    Previous HPI 11/07/22 Elizabeth Bautista is a 48 y.o. female here for follow up for seropositive RA currently off treatment she discontinued Enbrel  treatment after she had new findings of dysplastic nevus with surgical excision on the bottom of the right foot and plan for follow-up excision of 1 on the left foot.  She stopped taking the medicine for  concern of this contributing to skin cancer risk.  Previously had no known personal or family history for cancer.  Since discontinuing the Enbrel  she has increased pain and stiffness in multiple areas.  Starting to see additional swelling again in the right knee and right hand most frequently.   Previous HPI: 10/11/20 Elizabeth Bautista is a 48 y.o. female here for evaluation and management of rheumatoid arthritis. Symptoms started at least since 2017 with hand, wrist, and knee pain and swelling periodically and worsened over time. She did not seek medication evaluation for some time due to being very busy with work and symptoms progressively increased. She was hospitalized for severe pain and swelling especially in the left wrist and knee and workup at that time was more extensive and revealed positive RA serology suspected as the cause. She saw rheumatology since 2020 and started treatment initial with prednisone  that did not control symptoms well but caused a lot of weight gain. She has had chronic steroid exposure due to refractory asthma symptoms over the years. She also started hydroxychloroquine  that apparently helped symptoms reasonably well initially but has worsened. She moved form [place]  a year ago due to safety circumstances and has some family in the area and is continuing HCQ treatment but overall having a lot of joint pain. Currently feels swelling and heat in the joints multiple days per week and walks with either a cane or walker for her left knee pain and instability depending on how badly it is acting up. She was recently hospitalized for asthma exacerbation on 4/28 and discharged 5/2.   Labs reviewed 10/2018 RF ~30, CCP >250 ANA neg   Imaging reviewed Chest xray 08/2020 Cardiomegaly no airspace disease   Lumbar spine xray 08/2020 No acute findings   No Rheumatology ROS completed.   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   Mixed stress and urge urinary incontinence 08/14/2022   Encounter for screening fecal occult blood testing 08/14/2022   Encounter for well woman exam with routine gynecological exam 08/14/2022   Decreased libido 08/14/2022   Perimenopause 08/14/2022   Screening examination for STD (sexually transmitted disease) 08/14/2022   Otalgia of both ears 07/09/2022   Vitamin B1 deficiency 04/22/2022   Gastric ulcer 02/11/2022   Laryngopharyngeal reflux (LPR) 12/26/2021   History of gastric bypass 10/21/2021   Spinal stenosis of lumbar region with neurogenic claudication 08/27/2021   Fall 08/27/2021   Sinusitis 08/11/2021   Allergic rhinitis 08/07/2021   Hospital discharge follow-up 08/02/2021   (HFpEF) heart failure with preserved ejection fraction (HCC) 07/20/2021  Body mass index (BMI) 45.0-49.9, adult (HCC) 02/05/2021   Iron deficiency anemia due to chronic blood loss 10/17/2020   Tension-type headache, not intractable 10/17/2020   High risk medication use 10/11/2020   Acute hip pain, left 09/19/2020   Sciatica 09/11/2020   Dysmenorrhea 06/04/2020   Menorrhagia with irregular cycle 06/04/2020   Anxiety and depression 06/04/2020   Physical deconditioning 05/24/2020   Severe persistent asthma 03/08/2020   GERD (gastroesophageal reflux disease) 03/07/2020   Anxiety 03/07/2020   History of seizures 03/07/2020   Swelling of lower extremity 02/01/2020   Essential hypertension 02/01/2020   Vitamin D  deficiency 02/01/2020   Type 2 diabetes mellitus with hyperglycemia, without  long-term current use of insulin  (HCC) 02/01/2020   Anemia 02/01/2020   Class 3 severe obesity with serious comorbidity and body mass index (BMI) of 45.0 to 49.9 in adult 10/22/2019   OSA on CPAP 10/18/2019   Seropositive rheumatoid arthritis (HCC) 10/18/2019    Past Medical History:  Diagnosis Date   Acute respiratory failure with hypoxia (HCC)    Anemia    Asthma    COVID-19 virus infection 05/16/2020   04/27/20 dx covid-19, not hospitalized   Diabetes (HCC)    on Metformin    Dysplastic nevus 09/24/2022   R ant sole - severe, Excised 10/14/22   Eczema    Edema    Heart problem    something with the arteries on the left side of the heart; upcoming appt with cardiology for evaluation   HTN (hypertension)    Rheumatoid arthritis (HCC)    on Plaquenil     Seizures (HCC)    last seizure 2019   Sleep apnea    Sleep apnea     Family History  Problem Relation Age of Onset   High blood pressure Mother    Rheum arthritis Mother    Diabetes Father    Diabetes Sister    Heart Problems Sister    Diabetes Brother    Diabetes Paternal Grandmother    Diabetes Other        father's side everybody died from Diabetes   High blood pressure Other        mother's side, multiple siblings with this    Heart attack Other        family member on mother's side    Diabetes Paternal Aunt    Seizures Cousin        not sibings to the other cousins with seizures   Breast cancer Cousin    Seizures Cousin        not sibings to the other cousins with seizures   Seizures Cousin        not sibings to the other cousins with seizures   Cervical cancer Maternal Aunt    Breast cancer Cousin    Dementia Maternal Aunt    Asthma Maternal Aunt    Heart Problems Maternal Grandmother    Diabetes Brother    Asthma Daughter    Angioedema Daughter    Asthma Son    Past Surgical History:  Procedure Laterality Date   APPENDECTOMY     CESAREAN SECTION     x3   CHOLECYSTECTOMY  02/17/2022   FOOT  SURGERY  2024   GASTRIC BYPASS     LAPAROSCOPIC GASTROTOMY W/ REPAIR OF ULCER  02/17/2022   TUBAL LIGATION  2003   Social History   Social History Narrative   Divorced.Lives with 3 kids.Originally from [place].Came from [place] ,[place] 7 months ago.  02/27/2020   Right handed   Caffeine: none     There is no immunization history on file for this patient.   Objective: Vital Signs: There were no vitals taken for this visit.   Physical Exam   Musculoskeletal Exam: ***  CDAI Exam: CDAI Score: -- Patient Global: --; Provider Global: -- Swollen: --; Tender: -- Joint Exam 12/15/2023   No joint exam has been documented for this visit   There is currently no information documented on the homunculus. Go to the Rheumatology activity and complete the homunculus joint exam.  Investigation: No additional findings.  Imaging: No results found.  Recent Labs: Lab Results  Component Value Date   WBC 4.3 09/15/2023   HGB 10.5 (L) 09/15/2023   PLT 204 09/15/2023   NA 140 09/15/2023   K 4.5 09/15/2023   CL 110 09/15/2023   CO2 27 09/15/2023   GLUCOSE 68 09/15/2023   BUN 8 09/15/2023   CREATININE 0.47 (L) 09/15/2023   BILITOT 0.3 09/15/2023   ALKPHOS 85 06/09/2022   AST 27 09/15/2023   ALT 24 09/15/2023   PROT 5.7 (L) 09/15/2023   ALBUMIN 3.2 (L) 06/09/2022   CALCIUM 8.0 (L) 09/15/2023   GFRAA 121 10/11/2020   QFTBGOLDPLUS NEGATIVE 04/29/2023    Speciality Comments: PLQ EYE EXAM 09/12/2021 [place] f/u 2-3 months Enbrel  started 01/29/11  Methotrexate  Approved Quantity: 4 units per 28 days.  Procedures:  No procedures performed Allergies: Ferumoxytol, Phenytoin, Pecan nut (diagnostic), and Phenylbutazones   Assessment / Plan:     Visit Diagnoses: No diagnosis found.  ***  Orders: No orders of the defined types were placed in this encounter.  No orders of the defined types were placed in this encounter.    Follow-Up Instructions: No follow-ups on  file.   Shelba SHAUNNA Potters, RT  Note - This record has been created using [employer].  Chart creation errors have been sought, but may not always  have been located. Such creation errors do not reflect on  the standard of medical care.

## 2023-12-03 ENCOUNTER — Ambulatory Visit: Admitting: Registered Nurse

## 2023-12-03 ENCOUNTER — Encounter: Admitting: Registered Nurse

## 2023-12-06 ENCOUNTER — Other Ambulatory Visit: Payer: Self-pay | Admitting: Internal Medicine

## 2023-12-06 DIAGNOSIS — M059 Rheumatoid arthritis with rheumatoid factor, unspecified: Secondary | ICD-10-CM

## 2023-12-07 NOTE — Telephone Encounter (Signed)
 Last Fill: 09/15/2023  Labs: 11/25/2023 RBC 3.45 HGB 8.1 HCT 25.3 MCV 73.3 MCH 23.5 MCHC 32 RDW CV 16.7 09/15/2023 CMP Creat 0.47 Calcium 8.0 Total Protein 5.7 Alkaline 194  Next Visit: 01/19/2024  Last Visit: 09/15/2023  DX: seropositive rheumatoid arthritis   Current Dose per office note 09/15/2023: methotrexate  0.4 mL [place] weekly   Okay to refill Methotrexate ?   Contacted patient to confirm she is taking the medication and she is.

## 2023-12-15 ENCOUNTER — Ambulatory Visit: Admitting: Internal Medicine

## 2023-12-21 ENCOUNTER — Encounter: Admitting: Physical Medicine and Rehabilitation

## 2023-12-22 ENCOUNTER — Ambulatory Visit: Admitting: Dermatology

## 2023-12-23 ENCOUNTER — Other Ambulatory Visit (HOSPITAL_COMMUNITY): Payer: Self-pay

## 2023-12-25 ENCOUNTER — Other Ambulatory Visit (HOSPITAL_COMMUNITY): Payer: Self-pay

## 2023-12-28 ENCOUNTER — Other Ambulatory Visit: Payer: Self-pay

## 2023-12-28 ENCOUNTER — Other Ambulatory Visit: Payer: Self-pay | Admitting: Pharmacy Technician

## 2023-12-28 NOTE — Progress Notes (Signed)
 Specialty Pharmacy Refill Coordination Note  Elizabeth Bautista is a 48 y.o. female contacted today regarding refills of specialty medication(s) Abatacept  (Orencia  [employer])   Patient requested Delivery   Delivery date: 12/31/23   Verified address: [address]  [place]   Medication will be filled on 12/30/23.

## 2023-12-29 ENCOUNTER — Other Ambulatory Visit: Payer: Self-pay

## 2024-01-03 ENCOUNTER — Other Ambulatory Visit: Payer: Self-pay | Admitting: Internal Medicine

## 2024-01-03 DIAGNOSIS — M059 Rheumatoid arthritis with rheumatoid factor, unspecified: Secondary | ICD-10-CM

## 2024-01-04 NOTE — Telephone Encounter (Signed)
 Last Fill: 12/07/2023  Labs: 11/25/2023 RBC 3.45 HGB 8.1 HCT 25.3 MCV 73.3 MCH 23.5 MCHC 32 RDW CV 16.7 09/15/2023 CMP Creat 0.47 Calcium 8.0 Total Protein 5.7 Alkaline 194  Next Visit: 01/19/2024  Last Visit: 09/15/2023  DX:  Seropositive rheumatoid arthritis   Current Dose per office note 09/15/2023: Increase methotrexate  dose to 0.6 mg if liver tests are normal.   Okay to refill Methotrexate ?

## 2024-01-05 ENCOUNTER — Encounter: Admitting: Registered Nurse

## 2024-01-07 NOTE — Progress Notes (Signed)
 Office Visit Note  Patient: Elizabeth Bautista             Date of Birth: 1976-05-07           MRN: [id number]             PCP: Vonzell Ronnald ONEIDA DEVONNA Referring: Vonzell Ronnald ONEIDA, PA-C Visit Date: 01/19/2024   Subjective:  Follow-up  Patient location: At home [address] [place], [place] Provider location: In office [facility] health rheumatology [address] [place], [place]  History of Present Illness: Elizabeth Bautista is a 48 y.o. female meeting today by telephone encounter here for follow up for seropositive rheumatoid arthritis on Orencia  125 mg [place] weekly, methotrexate  0.4 mL [place] weekly, and folic acid  1 mg daily.   She reports ongoing symptoms particularly with swelling at the feet and ankles but has not experienced major flareups of her inflammatory arthritis since our last visit.  Does have morning stiffness but hard to differentiate as she has generalized pain throughout the day with combination of fibromyalgia syndrome also with lumbar degenerative disc disease and myofascial pain worse with insomnia.  She is seeing Dr. Lorilee with pain management working on medications including baclofen , Tylenol  No. 4, Savella , possibly starting low-dose naltrexone or Belbuca .  Also has ongoing problems with iron deficiency anemia related to blood loss.  Most recent hemoglobin low at 8.5 she had iron infusion with plan for repeat tests after 1 month.  Denies any serious interval infections.  Previous HPI 09/15/2023 Elizabeth Bautista is a 48 y.o. female here for follow up seropositive rheumatoid arthritis on Orencia  125 mg [place] weekly, methotrexate  0.4 mL [place] weekly, and folic acid  1 mg daily.    She has experienced some improvement with methotrexate , although not complete. Her hands remain the most affected area, with persistent swelling, particularly in the right hand knuckles. The swelling has decreased but is still present, impacting her daily activities. Her ankles also swell but are  less painful than before. The pain in her ankles has subsided significantly compared to her hands.   She denies any side effects from methotrexate  such as nausea or diarrhea, but has experienced weight gain, which she attributes to another medication for depression. She is currently on a low dose of methotrexate , 0.4 mg, due to previous concerns about liver enzyme levels, which have since normalized following weight loss.   She reports ongoing knee pain, though less intense than previously. She uses a knee brace, which requires adjustment due to size changes, and notes that she does not feel the need for it as much anymore. She no longer uses a cane when not in pain.   No recent respiratory infections or significant illnesses. She reports cold hands and feet without color changes and experiences morning stiffness in her joints.         Previous HPI 04/29/2023 Elizabeth Bautista is a 48 y.o. female with seropositive rheumatoid arthritis on Orencia  125 mg [place] weekly here due to increased joint pain, particularly in the ankles, over the past month. The pain is described as so severe that it impedes the ability to walk, with some days being completely incapacitating. The patient also reports several falls due to the pain. The pain radiates from the ankles to the underside of the foot and is accompanied by swelling. The patient notes that the right ankle has sometimes been more inflamed than the other, although both are painful.   Pain management has recently switched the patient  to oxycodone , which provides temporary relief but does not alleviate the persistent daily pain. Physical therapy has been attempted but was deemed ineffective due to the patient's inability to walk or move the ankles. The patient expressed a preference for aqua therapy, which previously provided more relief, but has been unable to continue due to ongoing diarrhea issues since bariatric surgery   In addition to the ankle pain, the  patient reports increased swelling and pain in the right hand, to the point of being unable to close it. The patient continues to take Orencia  shots for rheumatoid arthritis management, which were effective prior to the recent increase in pain.   The patient also reports ongoing gastric issues, including persistent diarrhea and vomiting, since a recent surgery. These issues have been associated with malabsorption, leading to nutritional deficiencies and hair loss. The patient was hospitalized last month due to these issues, and lab results indicated low levels of certain nutrients.    Previous HPI 11/07/22 Elizabeth Bautista is a 48 y.o. female here for follow up for seropositive RA currently off treatment she discontinued Enbrel  treatment after she had new findings of dysplastic nevus with surgical excision on the bottom of the right foot and plan for follow-up excision of 1 on the left foot.  She stopped taking the medicine for concern of this contributing to skin cancer risk.  Previously had no known personal or family history for cancer.  Since discontinuing the Enbrel  she has increased pain and stiffness in multiple areas.  Starting to see additional swelling again in the right knee and right hand most frequently.   Previous HPI: 10/11/20 Elizabeth Bautista is a 48 y.o. female here for evaluation and management of rheumatoid arthritis. Symptoms started at least since 2017 with hand, wrist, and knee pain and swelling periodically and worsened over time. She did not seek medication evaluation for some time due to being very busy with work and symptoms progressively increased. She was hospitalized for severe pain and swelling especially in the left wrist and knee and workup at that time was more extensive and revealed positive RA serology suspected as the cause. She saw rheumatology since 2020 and started treatment initial with prednisone  that did not control symptoms well but caused a lot of weight gain. She has  had chronic steroid exposure due to refractory asthma symptoms over the years. She also started hydroxychloroquine  that apparently helped symptoms reasonably well initially but has worsened. She moved form [place]  a year ago due to safety circumstances and has some family in the area and is continuing HCQ treatment but overall having a lot of joint pain. Currently feels swelling and heat in the joints multiple days per week and walks with either a cane or walker for her left knee pain and instability depending on how badly it is acting up. She was recently hospitalized for asthma exacerbation on 4/28 and discharged 5/2.   Labs reviewed 10/2018 RF ~30, CCP >250 ANA neg   Imaging reviewed Chest xray 08/2020 Cardiomegaly no airspace disease   Lumbar spine xray 08/2020 No acute findings   Review of Systems  Constitutional:  Positive for fatigue.  HENT:  Positive for mouth dryness. Negative for mouth sores.   Eyes:  Positive for dryness.  Respiratory:  Positive for shortness of breath.   Cardiovascular:  Positive for chest pain and palpitations.  Gastrointestinal:  Positive for diarrhea. Negative for blood in stool and constipation.  Endocrine: Positive for increased urination.  Genitourinary:  Positive for  involuntary urination.  Musculoskeletal:  Positive for joint pain, gait problem, joint pain, joint swelling, myalgias, muscle weakness, morning stiffness, muscle tenderness and myalgias.  Skin:  Positive for color change, rash and sensitivity to sunlight. Negative for hair loss.  Allergic/Immunologic: Positive for susceptible to infections.  Neurological:  Positive for dizziness and headaches.  Hematological:  Negative for swollen glands.  Psychiatric/Behavioral:  Positive for depressed mood and sleep disturbance. The patient is nervous/anxious.     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   Mixed stress and urge urinary incontinence 08/14/2022   Encounter for screening fecal occult blood testing 08/14/2022   Encounter for well woman exam with routine gynecological exam 08/14/2022   Decreased libido 08/14/2022   Perimenopause 08/14/2022   Screening examination for STD (sexually transmitted disease) 08/14/2022   Otalgia of both ears 07/09/2022   Vitamin B1 deficiency 04/22/2022   Gastric ulcer 02/11/2022   Laryngopharyngeal reflux (LPR) 12/26/2021   History of gastric bypass 10/21/2021   Spinal stenosis of lumbar region with neurogenic claudication 08/27/2021   Fall 08/27/2021   Sinusitis 08/11/2021   Allergic rhinitis 08/07/2021   Hospital discharge follow-up 08/02/2021   (HFpEF) heart failure with preserved ejection fraction (HCC) 07/20/2021   Body mass index (BMI) 45.0-49.9, adult (HCC) 02/05/2021   Iron deficiency anemia due to chronic blood loss 10/17/2020   Tension-type headache, not intractable 10/17/2020   High risk medication use 10/11/2020   Acute hip pain, left 09/19/2020   Sciatica 09/11/2020   Dysmenorrhea 06/04/2020   Menorrhagia with irregular cycle 06/04/2020   Anxiety and depression 06/04/2020   Physical deconditioning 05/24/2020   Severe persistent asthma 03/08/2020   GERD (gastroesophageal reflux disease) 03/07/2020   Anxiety 03/07/2020   History of seizures 03/07/2020   Swelling of lower extremity 02/01/2020   Essential hypertension 02/01/2020   Vitamin D  deficiency 02/01/2020   Type 2 diabetes mellitus with hyperglycemia, without long-term current use of insulin   (HCC) 02/01/2020   Anemia 02/01/2020   Class 3 severe obesity with serious comorbidity and body mass index (BMI) of 45.0 to 49.9 in adult 10/22/2019   OSA on CPAP 10/18/2019   Seropositive rheumatoid arthritis (HCC) 10/18/2019    Past Medical History:  Diagnosis Date   Acute respiratory failure with hypoxia (HCC)    Anemia    Asthma    COVID-19 virus infection 05/16/2020   04/27/20 dx covid-19, not hospitalized   Diabetes (HCC)    on Metformin    Dysplastic nevus 09/24/2022   R ant sole - severe, Excised 10/14/22   Eczema    Edema    Heart problem    something with the arteries on the left side of the heart; upcoming appt with cardiology for evaluation   HTN (hypertension)    Rheumatoid arthritis (HCC)    on Plaquenil     Seizures (HCC)    last seizure 2019   Sleep apnea    Sleep apnea     Family History  Problem Relation Age of Onset   High blood pressure Mother  Rheum arthritis Mother    Diabetes Father    Diabetes Sister    Heart Problems Sister    Diabetes Brother    Diabetes Paternal Grandmother    Diabetes Other        father's side everybody died from Diabetes   High blood pressure Other        mother's side, multiple siblings with this    Heart attack Other        family member on mother's side    Diabetes Paternal Aunt    Seizures Cousin        not sibings to the other cousins with seizures   Breast cancer Cousin    Seizures Cousin        not sibings to the other cousins with seizures   Seizures Cousin        not sibings to the other cousins with seizures   Cervical cancer Maternal Aunt    Breast cancer Cousin    Dementia Maternal Aunt    Asthma Maternal Aunt    Heart Problems Maternal Grandmother    Diabetes Brother    Asthma Daughter    Angioedema Daughter    Asthma Son    Past Surgical History:  Procedure Laterality Date   APPENDECTOMY     CESAREAN SECTION     x3   CHOLECYSTECTOMY  02/17/2022   FOOT SURGERY  2024   GASTRIC BYPASS      LAPAROSCOPIC GASTROTOMY W/ REPAIR OF ULCER  02/17/2022   TUBAL LIGATION  2003   Social History   Social History Narrative   Divorced.Lives with 3 kids.Originally from [place].Came from [place] ,[place] 7 months ago.      02/27/2020   Right handed   Caffeine: none     There is no immunization history on file for this patient.    Investigation: No additional findings.  Imaging: No results found.  Recent Labs: Lab Results  Component Value Date   WBC 4.3 09/15/2023   HGB 10.5 (L) 09/15/2023   PLT 204 09/15/2023   NA 140 09/15/2023   K 4.5 09/15/2023   CL 110 09/15/2023   CO2 27 09/15/2023   GLUCOSE 68 09/15/2023   BUN 8 09/15/2023   CREATININE 0.47 (L) 09/15/2023   BILITOT 0.3 09/15/2023   ALKPHOS 85 06/09/2022   AST 27 09/15/2023   ALT 24 09/15/2023   PROT 5.7 (L) 09/15/2023   ALBUMIN 3.2 (L) 06/09/2022   CALCIUM 8.0 (L) 09/15/2023   GFRAA 121 10/11/2020   QFTBGOLDPLUS NEGATIVE 04/29/2023    Speciality Comments: PLQ EYE EXAM 09/12/2021 [place] f/u 2-3 months Enbrel  started 01/29/11  Methotrexate  Approved Quantity: 4 units per 28 days.  Procedures:  No procedures performed Allergies: Ferumoxytol, Phenytoin, Pecan nut (diagnostic), and Phenylbutazones   Assessment / Plan:     Visit Diagnoses: Seropositive rheumatoid arthritis (HCC) - Disease activity reported as stable.  Discussed importance for next follow-up to be in person for repeat clinical exam and medication management.  Inflammatory serology was normal at last check in April.  No reported disease flare and review of interval labs appropriate for medications. - Continue Orencia  125 mg subcu weekly - Continue methotrexate  10 mg subcu weekly - Continue folic acid  1 mg daily  High risk medication use - Orencia  125 mg [place] weekly, methotrexate  0.6 mL  weekly, and folic acid  1 mg daily Interval lab results reviewed with blood count from July anemia hemoglobin 8.5 related to blood loss currently managed with iron  infusion.  Metabolic panel reviewed from end of June with results normal.  Previous mild elevation in alkaline phosphatase no other abnormality.  Reports no serious interval infections requiring antibiotic treatments.  Last negative QuantiFERON screening reviewed from November 2024. -Follow-up in 3 months and office visit for repeat lab monitoring    Orders: No orders of the defined types were placed in this encounter.  Meds ordered this encounter  Medications   Abatacept  (ORENCIA  CLICKJECT) 125 MG/ML SOAJ    Sig: Inject 125 mg into the skin once a week.    Dispense:  4 mL    Refill:  2    Prescription Type::   Renewal   methotrexate  50 MG/2ML injection    Sig: Inject 0.6 mLs (15 mg total) into the skin once a week.    Dispense:  4 mL    Refill:  2   TUBERCULIN SYR 1CC/26GX3/8 26G X 3/8 1 ML MISC    Sig: 1 each by Does not apply route once a week.    Dispense:  25 each    Refill:  0   Total of 11 minutes spent on telephone encounter today including patient history discussion counseling order placement and documentation.  Follow-Up Instructions: No follow-ups on file.   Lonni LELON Ester, MD  Note - This record has been created using [employer].  Chart creation errors have been sought, but may not always  have been located. Such creation errors do not reflect on  the standard of medical care.

## 2024-01-11 ENCOUNTER — Encounter: Payer: Self-pay | Admitting: Registered Nurse

## 2024-01-11 ENCOUNTER — Encounter: Attending: Physical Medicine and Rehabilitation | Admitting: Registered Nurse

## 2024-01-11 VITALS — BP 127/78 | HR 65 | Ht 64.0 in | Wt 132.0 lb

## 2024-01-11 DIAGNOSIS — G8929 Other chronic pain: Secondary | ICD-10-CM | POA: Diagnosis present

## 2024-01-11 DIAGNOSIS — M25571 Pain in right ankle and joints of right foot: Secondary | ICD-10-CM | POA: Insufficient documentation

## 2024-01-11 DIAGNOSIS — M25511 Pain in right shoulder: Secondary | ICD-10-CM | POA: Insufficient documentation

## 2024-01-11 DIAGNOSIS — M25512 Pain in left shoulder: Secondary | ICD-10-CM | POA: Insufficient documentation

## 2024-01-11 DIAGNOSIS — M5416 Radiculopathy, lumbar region: Secondary | ICD-10-CM | POA: Insufficient documentation

## 2024-01-11 DIAGNOSIS — M546 Pain in thoracic spine: Secondary | ICD-10-CM | POA: Diagnosis present

## 2024-01-11 DIAGNOSIS — M25572 Pain in left ankle and joints of left foot: Secondary | ICD-10-CM | POA: Diagnosis present

## 2024-01-11 DIAGNOSIS — Z79891 Long term (current) use of opiate analgesic: Secondary | ICD-10-CM | POA: Insufficient documentation

## 2024-01-11 DIAGNOSIS — Z5181 Encounter for therapeutic drug level monitoring: Secondary | ICD-10-CM | POA: Insufficient documentation

## 2024-01-11 DIAGNOSIS — M797 Fibromyalgia: Secondary | ICD-10-CM | POA: Insufficient documentation

## 2024-01-11 DIAGNOSIS — G894 Chronic pain syndrome: Secondary | ICD-10-CM | POA: Insufficient documentation

## 2024-01-11 NOTE — Progress Notes (Signed)
 Subjective:    Patient ID: Elizabeth Bautista, female    DOB: [birth year], 48 y.o.   MRN: [id number]  HPI: Elizabeth Bautista is a 48 y.o. female who returns for follow up appointment for chronic pain and medication refill. She states her pain is located in her bilateral shoulders, upper- lower back pain radiating in her bilateral hips and bilateral buttocks.She also reports bilateral knee pain and generalized joint pain.  She  rates her pain 8.  Elizabeth Bautista last office visit was on 09/29/2023 and last phone visit was on 06/ 24/2025.   Explained to Elizabeth Bautista the office policy in order to receive her Oxycodone , she has to be seen at least every two months, per office policy. She states she didn't come to the office due to her not feeling well, she also reports  Dr Lorilee knows she is dealing with a lot. Sent Dr Lorilee a secure chat, regarding the above. No answer at the time of the visit. After the visit I called Dr Lorilee and explained the above, we will have to await results prior to Oxycodone  being prescribed.   Elizabeth Bautista states  she was done with the visit, asked if I could performed a physical assessment, she refused.   Elizabeth Bautista refused physical Assessment from this provider, she asked at checkout not to be scheduled with this provider. This was discussed with Dr Lorilee as well. She will be scheduled with Dr Lorilee per patient preference.   Elizabeth Bautista  last Oxycodone  prescription was filled on 11/16/2023, her Morphine  equivalent is 45.00 MME.   Oral Swab was performed today.     Pain Inventory Average Pain 10 Pain Right Now 8 My pain is constant, sharp, burning, dull, stabbing, tingling, and aching  In the last 24 hours, has pain interfered with the following? General activity 1 Relation with others 1 Enjoyment of life 2 What TIME of day is your pain at its worst? morning , daytime, evening, and night Sleep (in general) Poor  Pain is worse with: walking, bending,  sitting, inactivity, and standing Pain improves with: rest and medication Relief from Meds: 5  Family History  Problem Relation Age of Onset   High blood pressure Mother    Rheum arthritis Mother    Diabetes Father    Diabetes Sister    Heart Problems Sister    Diabetes Brother    Diabetes Paternal Grandmother    Diabetes Other        father's side everybody died from Diabetes   High blood pressure Other        mother's side, multiple siblings with this    Heart attack Other        family member on mother's side    Diabetes Paternal Aunt    Seizures Cousin        not sibings to the other cousins with seizures   Breast cancer Cousin    Seizures Cousin        not sibings to the other cousins with seizures   Seizures Cousin        not sibings to the other cousins with seizures   Cervical cancer Maternal Aunt    Breast cancer Cousin    Dementia Maternal Aunt    Asthma Maternal Aunt    Heart Problems Maternal Grandmother    Diabetes Brother    Asthma Daughter    Angioedema Daughter    Asthma Son    Social History   Socioeconomic  History   Marital status: Single    Spouse name: Not on file   Number of children: 5   Years of education: Not on file   Highest education level: 9th grade  Occupational History   Not on file  Tobacco Use   Smoking status: Former    Current packs/day: 0.00    Average packs/day: 0.1 packs/day for 10.0 years (0.5 ttl pk-yrs)    Types: Cigarettes    Start date: 06/03/2003    Quit date: 06/02/2013    Years since quitting: 10.6    Passive exposure: Never   Smokeless tobacco: Never   Tobacco comments:    during the 10 years of smoking, smoked 2-3 cigarettes/day  Vaping Use   Vaping status: Never Used  Substance and Sexual Activity   Alcohol use: Not Currently   Drug use: Not Currently   Sexual activity: Not Currently    Birth control/protection: Surgical    Comment: tubal  Other Topics Concern   Not on file  Social History Narrative    Divorced.Lives with 3 kids.Originally from [place].Came from [place] ,[place] 7 months ago.      02/27/2020   Right handed   Caffeine: none    Social Drivers of Health   Financial Resource Strain: High Risk (12/30/2023)   Received from [facility]   Overall Financial Resource Strain (CARDIA)    How hard is it for you to pay for the very basics like food, housing, medical care, and heating?: Very hard  Food Insecurity: Food Insecurity Present (12/30/2023)   Received from [facility]   Hunger Vital Sign    Within the past 12 months, you worried that your food would run out before you got the money to buy more.: Often true    Within the past 12 months, the food you bought just didn't last and you didn't have money to get more.: Often true  Transportation Needs: Unmet Transportation Needs (12/30/2023)   Received from [facility] - Transportation    In the past 12 months, has lack of transportation kept you from medical appointments or from getting medications?: No    In the past 12 months, has lack of transportation kept you from meetings, work, or from getting things needed for daily living?: Yes  Physical Activity: Inactive (12/30/2023)   Received from [facility]   Exercise Vital Sign    On average, how many days per week do you engage in moderate to strenuous exercise (like a brisk walk)?: 0 days    Minutes of Exercise per Session: Not on file  Stress: Stress Concern Present (12/30/2023)   Received from [facility] - Occupational Stress Questionnaire    Do you feel stress - tense, restless, nervous, or anxious, or unable to sleep at night because your mind is troubled all the time - these days?: Very much  Social Connections: Socially Isolated (12/30/2023)   Received from [facility]   Social Network    How would you rate your social network (family, work, friends)?: Little participation, lonely and socially isolated   Past  Surgical History:  Procedure Laterality Date   APPENDECTOMY     CESAREAN SECTION     x3   CHOLECYSTECTOMY  02/17/2022   FOOT SURGERY  2024   GASTRIC BYPASS     LAPAROSCOPIC GASTROTOMY W/ REPAIR OF ULCER  02/17/2022   TUBAL LIGATION  2003   Past Surgical History:  Procedure Laterality Date  APPENDECTOMY     CESAREAN SECTION     x3   CHOLECYSTECTOMY  02/17/2022   FOOT SURGERY  2024   GASTRIC BYPASS     LAPAROSCOPIC GASTROTOMY W/ REPAIR OF ULCER  02/17/2022   TUBAL LIGATION  2003   Past Medical History:  Diagnosis Date   Acute respiratory failure with hypoxia (HCC)    Anemia    Asthma    COVID-19 virus infection 05/16/2020   04/27/20 dx covid-19, not hospitalized   Diabetes (HCC)    on Metformin    Dysplastic nevus 09/24/2022   R ant sole - severe, Excised 10/14/22   Eczema    Edema    Heart problem    something with the arteries on the left side of the heart; upcoming appt with cardiology for evaluation   HTN (hypertension)    Rheumatoid arthritis (HCC)    on Plaquenil     Seizures (HCC)    last seizure 2019   Sleep apnea    Sleep apnea    Ht 5' 4 (1.626 m)   Wt 132 lb (59.9 kg)   BMI 22.66 kg/m   Opioid Risk Score:   Fall Risk Score:  `1  Depression screen [facility] 2/9     09/29/2023    2:04 PM 06/18/2023   10:51 AM 04/13/2023    9:28 AM 09/22/2022    9:42 AM 08/19/2022   10:07 AM 08/14/2022    8:38 AM 05/27/2022    9:22 AM  Depression screen PHQ 2/9  Decreased Interest 3 3 3  0 0 3 1  Down, Depressed, Hopeless 3 3 3  0 0 3 1  PHQ - 2 Score 6 6 6  0 0 6 2  Altered sleeping  3 3   3    Tired, decreased energy  3 3   3    Change in appetite  1 3   3    Feeling bad or failure about yourself   3 3   3    Trouble concentrating  3 3   3    Moving slowly or fidgety/restless  3 0   2   Suicidal thoughts  0 0   2   PHQ-9 Score  22 21   25    Difficult doing work/chores  Extremely dIfficult Extremely dIfficult        Review of Systems  Musculoskeletal:  Positive  for back pain, gait problem and neck pain.       Pain in both hips, both shoulders, both knees, both feet, both ankles, hands, elbows.  All other systems reviewed and are negative.      Objective:   Physical Exam Vitals and nursing note reviewed.  Musculoskeletal:     Comments: She refused physical assessment           Assessment & Plan:  Chronic Bilateral Shoulder Pain: Continue HEP as Tolerated. Continue to Monitor.  Chronic Bilateral Thoracic Pain: Continue HEP as Tolerated. Continue to Monitor.  Chronic Bilateral Low Back Pain with Bilateral Sciatica: Continue Current medication regimen. Continue to Monitor. 01/11/2024 4. Fibromyalgia Syndrome: Continue HEP as Tolerated. Continue current medication regimen. Continue to Monitor. 01/11/2024 5. Chronic Pain Syndrome: Oral Swab was performed today, if consistent, will resume Oxycodone  We will continue the opioid monitoring program, this consists of regular clinic visits, examinations, urine drug screen, pill counts as well as use of [place]  Controlled Substance Reporting system. A 12 month History has been reviewed on the   Controlled Substance Reporting System on  01/11/2024  6.  Chronic Bilateral Ankle Pain: Continue HEP as Tolerated. Continue to Monitor.   F/U in 2 months with Dr Lorilee

## 2024-01-12 ENCOUNTER — Encounter: Payer: Self-pay | Admitting: Physical Medicine and Rehabilitation

## 2024-01-14 ENCOUNTER — Encounter (HOSPITAL_BASED_OUTPATIENT_CLINIC_OR_DEPARTMENT_OTHER): Admitting: Physical Medicine and Rehabilitation

## 2024-01-14 ENCOUNTER — Other Ambulatory Visit: Payer: Self-pay | Admitting: Internal Medicine

## 2024-01-14 ENCOUNTER — Ambulatory Visit: Admitting: Dermatology

## 2024-01-14 ENCOUNTER — Other Ambulatory Visit: Payer: Self-pay

## 2024-01-14 DIAGNOSIS — M059 Rheumatoid arthritis with rheumatoid factor, unspecified: Secondary | ICD-10-CM

## 2024-01-14 DIAGNOSIS — G894 Chronic pain syndrome: Secondary | ICD-10-CM

## 2024-01-14 DIAGNOSIS — Z79899 Other long term (current) drug therapy: Secondary | ICD-10-CM

## 2024-01-14 LAB — DRUG TOX MONITOR 1 W/CONF, ORAL FLD

## 2024-01-14 LAB — DRUG TOX ALC METAB W/CON, ORAL FLD: Alcohol Metabolite: NEGATIVE ng/mL (ref <25)

## 2024-01-14 MED ORDER — BELBUCA 75 MCG BU FILM: 1.0000 | ORAL_FILM | Freq: Two times a day (BID) | BUCCAL | 3 refills | Status: DC

## 2024-01-14 MED ORDER — METHOCARBAMOL 500 MG PO TABS: 500.0000 mg | ORAL_TABLET | Freq: Four times a day (QID) | ORAL | 3 refills | Status: DC

## 2024-01-14 NOTE — Telephone Encounter (Signed)
 Last Fill: 10/27/2023  Labs: CBC 11/25/2023 RBC 3.45 HGB 8.1 HCT 25.3 MCV 73.3 MCH 23.5 MCHC 32 RDW CV 16.7 CMP 09/15/2023 Creat 0.47 Calcium 8.0 Total Protein 5.7 Alkaline Phosphatase 194   TB Gold: 04/29/2023 TB Gold Negative    Next Visit: 01/19/2024  Last Visit: 09/15/2023  DX: Seropositive rheumatoid arthritis   Current Dose per office note 09/15/2023: Orencia  125 mg [place] weekly   Okay to refill Orencia ?   Patient to update labs on at upcoming appointment.
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
## 2024-01-15 ENCOUNTER — Telehealth: Payer: Self-pay

## 2024-01-15 ENCOUNTER — Encounter (HOSPITAL_BASED_OUTPATIENT_CLINIC_OR_DEPARTMENT_OTHER): Admitting: Physical Medicine and Rehabilitation

## 2024-01-15 ENCOUNTER — Other Ambulatory Visit: Payer: Self-pay

## 2024-01-15 DIAGNOSIS — G8929 Other chronic pain: Secondary | ICD-10-CM

## 2024-01-15 DIAGNOSIS — M25512 Pain in left shoulder: Secondary | ICD-10-CM | POA: Diagnosis not present

## 2024-01-15 DIAGNOSIS — M25511 Pain in right shoulder: Secondary | ICD-10-CM

## 2024-01-15 DIAGNOSIS — M797 Fibromyalgia: Secondary | ICD-10-CM

## 2024-01-15 MED ORDER — ORENCIA CLICKJECT 125 MG/ML ~~LOC~~ SOAJ
125.0000 mg | SUBCUTANEOUS | 0 refills | Status: DC
  Filled 2024-01-15: qty 4, 28d supply, fill #0

## 2024-01-15 NOTE — Telephone Encounter (Signed)
 Belbuca  75 MG sent to plan

## 2024-01-18 ENCOUNTER — Telehealth: Payer: Self-pay | Admitting: Internal Medicine

## 2024-01-18 NOTE — Telephone Encounter (Signed)
 Your request has been denied  Denied. This drug is not on the formulary. This request has been denied because we do not have documentation showing that you have tried at least 2 formulary drugs from a similar drug class that can treat your condition. Based on the condition provided, formulary alternative(s) are listed below. Some formulary drug(s) may have quantity limits. Refer to the formulary for more information. Fentanyl  Patches (12mcg, 25mcg, 50mcg, 75mcg, 100mcg), morphine  sulfate extended release tablet (15mg , 30mg , 60mg , 100mg , and 200mg ), methadone hcl oral tablet (5 mg, 10 mg) (Prior authorization required for all alternatives.) We may be able to make an exception to cover this drug. Your doctor will need to send us  medical records showing that you tried the formulary drug(s). If you cannot take the formulary drug(s), your doctor must tell us  why.

## 2024-01-18 NOTE — Telephone Encounter (Signed)
 Pt would like to switch her appt tomorrow to a mychart video visit. Pts appt is 2:20PM

## 2024-01-19 ENCOUNTER — Other Ambulatory Visit: Payer: Self-pay

## 2024-01-19 ENCOUNTER — Ambulatory Visit: Attending: Internal Medicine | Admitting: Internal Medicine

## 2024-01-19 ENCOUNTER — Encounter: Payer: Self-pay | Admitting: Internal Medicine

## 2024-01-19 VITALS — Ht 64.0 in | Wt 131.0 lb

## 2024-01-19 DIAGNOSIS — Z79899 Other long term (current) drug therapy: Secondary | ICD-10-CM | POA: Insufficient documentation

## 2024-01-19 DIAGNOSIS — M059 Rheumatoid arthritis with rheumatoid factor, unspecified: Secondary | ICD-10-CM | POA: Diagnosis not present

## 2024-01-19 MED ORDER — ORENCIA CLICKJECT 125 MG/ML ~~LOC~~ SOAJ
125.0000 mg | SUBCUTANEOUS | 2 refills | Status: DC
  Filled 2024-01-19 – 2024-01-20 (×2): qty 4, 28d supply, fill #0
  Filled 2024-02-22: qty 4, 28d supply, fill #1
  Filled 2024-03-14: qty 4, 28d supply, fill #2

## 2024-01-19 MED ORDER — TUBERCULIN SYRINGE 26G X 3/8" 1 ML MISC: 1.0000 | 0 refills | Status: DC

## 2024-01-19 MED ORDER — METHOTREXATE SODIUM CHEMO INJECTION 50 MG/2ML: 15.0000 mg | INTRAMUSCULAR | 2 refills | Status: DC

## 2024-01-19 NOTE — Telephone Encounter (Signed)
 Yes we can switch today's appointment to a virtual visit.

## 2024-01-19 NOTE — Telephone Encounter (Signed)
 Patient is aware of the denial. The next step for you to appeal. Please call [phone].
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
## 2024-01-20 ENCOUNTER — Other Ambulatory Visit: Payer: Self-pay | Admitting: Pharmacy Technician

## 2024-01-20 ENCOUNTER — Other Ambulatory Visit: Payer: Self-pay

## 2024-01-20 NOTE — Progress Notes (Signed)
 Specialty Pharmacy Refill Coordination Note  Elizabeth Bautista is a 48 y.o. female contacted today regarding refills of specialty medication(s) Abatacept  (Orencia  [employer])   Patient requested Delivery   Delivery date: 01/26/24   Verified address: [address] [place]   Medication will be filled on 01/25/24.

## 2024-01-21 ENCOUNTER — Encounter (HOSPITAL_BASED_OUTPATIENT_CLINIC_OR_DEPARTMENT_OTHER): Admitting: Physical Medicine and Rehabilitation

## 2024-01-21 DIAGNOSIS — G894 Chronic pain syndrome: Secondary | ICD-10-CM

## 2024-01-21 MED ORDER — AMITRIPTYLINE HCL 25 MG PO TABS: 25.0000 mg | ORAL_TABLET | Freq: Every day | ORAL | 3 refills | Status: DC
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
## 2024-01-26 ENCOUNTER — Encounter (HOSPITAL_BASED_OUTPATIENT_CLINIC_OR_DEPARTMENT_OTHER): Admitting: Physical Medicine and Rehabilitation

## 2024-01-26 ENCOUNTER — Telehealth: Payer: Self-pay

## 2024-01-26 DIAGNOSIS — G894 Chronic pain syndrome: Secondary | ICD-10-CM | POA: Diagnosis not present

## 2024-01-26 MED ORDER — LIDOCAINE 5 % EX PTCH: 1.0000 | MEDICATED_PATCH | CUTANEOUS | 0 refills | Status: DC

## 2024-01-26 MED ORDER — AMITRIPTYLINE HCL 50 MG PO TABS: 50.0000 mg | ORAL_TABLET | Freq: Every day | ORAL | 3 refills | Status: AC

## 2024-01-26 NOTE — Telephone Encounter (Signed)
(  Key: [id number]) PA Case ID #: [id number] Rx #: [id number] Status sent iconSent to Plan today Drug Lidocaine  5% patches  [facility] Medicare Electronic Prior Authorization Request Form [phone] NCPDP)
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
## 2024-01-27 NOTE — Telephone Encounter (Signed)
 Approved on August 26 by [facility] Medicare 2017 Approved. This drug has been approved under the Member's Medicare Part D benefit. Approved quantity: 30 units per 30 day(s). You may fill up to a 90 day supply except for those on Specialty Tier 5, which can be filled up to a 30 day supply. Please call the pharmacy to process the prescription claim. Effective Date: 01/12/2024 Authorization Expiration Date: 06/01/2098

## 2024-02-02 ENCOUNTER — Ambulatory Visit: Payer: Medicare Other | Admitting: Neurology

## 2024-02-03 NOTE — Progress Notes (Deleted)
 NEUROLOGY FOLLOW UP OFFICE NOTE  Elizabeth Bautista [id number]  Assessment/Plan:   Migraine without aura, without status migrainosus, not intractable   Migraine prevention:  Aimovig  140mg  every 4 weeks  Migraine rescue:  It appears that Ubrelvy  isn't formulary.  Will provide samples of Nurtec Limit use of pain relievers to no more than 2 days out of week to prevent risk of rebound or medication-overuse headache. Keep headache diary Follow up 6 months       Subjective:  Elizabeth Bautista is a 48 year old right-handed female with asthma. HTN, DM II, RA, iron-deficiency anemia, sleep apnea and pseudoseizures who follows up for migraines.  UPDATE: *** Intensity:  severe Duration:  15 minutes with Ubrelvy .  Nurtec ?  *** ubrelvy  formulary Frequency:  3 to 4 days a week.    Current NSAIDS/analgesics:  oxycodone  (chronic pain), Mobic  (chronic pain) Current triptans:  none Current ergotamine:  none Current anti-emetic:  Reglan 10mg , Zofran -ODT 8mg  Current muscle relaxants:  baclofen  rotating with Flexeril  Current Antihypertensive medications:  metoprolol  tartrate, amlodipine , hydrochlorothiazide , Lasix  Current Antidepressant medications:  amitriptyline  50mg  at bedtime, duloxetine  60mg  daily, mirtazapine  30mg   Current Anticonvulsant medications:  topiramate  50mg  QHS (for chronic pain by her pain doctor) Current anti-CGRP:  Aimovig  140mg , Ubrelvy  100mg  Current Antihistamines/Decongestants:  Zyrtec , meclizine, scopolamine  Other therapy:  none Hormone/birth control:  none Other medications:  buspirone , methotrexate , Lunesta , Belbuca , hydroxyzine   Caffeine:  no Alcohol:  occasional Smoker:  no Diet:  Drinks a lot of water.  Planning to have bariatric surgery Exercise:  she does but limited due to chronic pain (RA and fibromyalgia) Depression:  yes; Anxiety:  yes.  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 CT head on 03/10/2020 personally  reviewed normal.  EEG on 03/12/2020 normal. Other pain:  Chronic pain syndrome - has RA but also underlying fibromyalgia suspected.  Followed by physical medicine and rehab Sleep hygiene:  Poor sleep.  May be related to stress.  She has OSA.  CPAP recently adjusted.   HISTORY: History of migraines since childhood but resolved in young adulthood.  They returned around 2021 but progressively has gotten worse.  Severe diffuse squeezing and stabbing headache including the neck.  Back of neck gets stiff.  Nausea, vomiting, blurred vision/or vision goes dark; She saw an ophthalmologist and had an unremarkable eye exam.  Sometimes bilateral hand tingling, photophobia and phonophobia.  Usually last all day.  They occur usually 4 days a week.  No known triggers.  Nothing helps them.  She also reports short term memory problems in 2021 as well.  She says she will sometimes leave the stove on or may forget conversations.  Prior thyroid tests okay.  Clemens a couple of weeks ago and hit the back of her head.  Has a lump on back of head.  MRI of brain with and without contrast on 09/24/2021 was normal.      Past NSAIDS/analgesics:  tramadol , ibuprofen , naproxen  Past abortive triptans:  sumatriptan  tab, rizatriptan  Past abortive ergotamine:  none Past muscle relaxants:  Flexeril  Past anti-emetic: Promethazine  Past antihypertensive medications:  amlodipine , Lasix , HCTZ.  Beta blockers contraindicated due to severe asthma.   Past antidepressant medications:  duloxetine , escitalopram  Past anticonvulsant medications:  Keppra , gabapentin , pregablin, zonisamide  Past anti-CGRP:  none Past antihistamines/decongestants:  Flonase  Other past therapies:  none    Family history of headache:  Mother (migraines)    PAST MEDICAL HISTORY: Past Medical History:  Diagnosis Date   Acute respiratory failure with  hypoxia (HCC)    Anemia    Asthma    COVID-19 virus infection 05/16/2020   04/27/20 dx covid-19, not  hospitalized   Diabetes (HCC)    on Metformin    Dysplastic nevus 09/24/2022   R ant sole - severe, Excised 10/14/22   Eczema    Edema    Heart problem    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 4 mL 2   albuterol  (PROAIR  HFA) 108 (90 Base) MCG/ACT inhaler Inhale 2 puffs into the lungs every 6 (six) hours as needed for wheezing or shortness of breath. 2 puffs every 4 hours as needed only  if your can't catch your breath 18 g 11   albuterol  (PROVENTIL ) (2.5 MG/3ML) 0.083% nebulizer solution Take 3 mLs (2.5 mg total) by nebulization every 6 (six) hours as needed for wheezing or shortness of breath. 360 mL 5   amitriptyline  (ELAVIL ) 50 MG tablet Take 1 tablet (50 mg total) by mouth at bedtime. 90 tablet 3   amLODipine  (NORVASC ) 5 MG tablet Take by mouth.     Ascorbic Acid (VITAMIN C) 1000 MG tablet Take 1,000 mg by mouth daily.     baclofen  (LIORESAL ) 10 MG tablet Take 10 mg by mouth.     blood glucose meter kit and supplies Dispense based on patient and insurance preference. Use up to four times daily as directed. (FOR ICD-10 E10.9, E11.9). 1 each 0   Buprenorphine  HCl (BELBUCA ) 75 MCG FILM Place 1 Film inside cheek 2 (two) times daily. 60 each 3   busPIRone  (BUSPAR ) 7.5 MG tablet Take 7.5 mg by mouth daily.     cariprazine (VRAYLAR) 1.5 MG capsule Take by mouth.     cetirizine  (ZYRTEC ) 10 MG tablet TAKE 1 TABLET BY MOUTH EVERY DAY 90 tablet 1   colestipol (COLESTID) 1 g tablet Take 2 g by mouth daily.     cyclobenzaprine  (FLEXERIL ) 5 MG tablet Take 1 tablet (5 mg total) by mouth 3 (three) times daily  as needed for muscle spasms. 270 tablet 0   doxycycline (MONODOX) 100 MG capsule Take 100 mg by mouth 2 (two) times daily.     DULoxetine  (CYMBALTA ) 60 MG capsule Take by mouth.     EPINEPHrine  0.3 mg/0.3 mL IJ SOAJ injection Inject 0.3 mg into the muscle as needed for anaphylaxis. (Patient not taking: Reported on 01/19/2024) 1 each 1   Erenumab -aooe (AIMOVIG ) 140 MG/ML SOAJ Inject 140 mg into the skin every 30 (thirty) days. 1.12 mL 11   eszopiclone  (LUNESTA ) 1 MG TABS tablet Take 1 tablet (1 mg total) by mouth at bedtime as needed for sleep. Take immediately before bedtime 30 tablet 3   famotidine  (PEPCID ) 20 MG tablet Take 1 tablet (20 mg total) by mouth daily. 30 tablet 2   Fluticasone -Umeclidin-Vilant (TRELEGY ELLIPTA ) 200-62.5-25 MCG/ACT AEPB Inhale 1 puff into the lungs daily. 60 each 5   folic acid  (FOLVITE ) 1 MG tablet Take 1 tablet (1 mg total) by mouth daily. 90 tablet 3   furosemide  (LASIX ) 20  MG tablet Take 1 tablet (20 mg total) by mouth daily as needed. 30 tablet 3   hydrochlorothiazide  (HYDRODIURIL ) 25 MG tablet Take by mouth.     hydrOXYzine  (ATARAX ) 25 MG tablet Take 25 mg by mouth 3 (three) times daily as needed.     ibuprofen  (ADVIL ) 800 MG tablet Take 1 tablet (800 mg total) by mouth every 8 (eight) hours as needed. (Patient not taking: Reported on 01/19/2024) 90 tablet 0   ipratropium-albuterol  (DUONEB) 0.5-2.5 (3) MG/3ML SOLN Take 3 mLs by nebulization every 6 (six) hours as needed. 360 mL 11   lidocaine  (LIDODERM ) 5 % Place 1 patch onto the skin daily. Remove & Discard patch within 12 hours or as directed by MD 30 patch 0   meclizine (ANTIVERT) 12.5 MG tablet TAKE ONE TABLET (12.5 MG DOSE) BY MOUTH 3 (THREE) TIMES A DAY AS NEEDED FOR NAUSEA.     methocarbamol  (ROBAXIN ) 500 MG tablet Take 1 tablet (500 mg total) by mouth 4 (four) times daily. 120 tablet 3   methotrexate  50 MG/2ML injection Inject 0.6 mLs (15 mg total) into the skin once a week. 4 mL 2   metoCLOPramide (REGLAN)  10 MG tablet Take by mouth.     metoprolol  tartrate (LOPRESSOR ) 50 MG tablet Take by mouth.     mirtazapine  (REMERON ) 30 MG tablet Take by mouth.     montelukast  (SINGULAIR ) 10 MG tablet TAKE 1 TABLET BY MOUTH EVERYDAY AT BEDTIME 30 tablet 11   Multiple Vitamin (MULTIVITAMIN) tablet Take 1 tablet by mouth daily.     mupirocin  ointment (BACTROBAN ) 2 % Apply 1 Application topically daily. Qd to excision site 22 g 1   omeprazole  (PRILOSEC) 40 MG capsule Take by mouth.     ondansetron  (ZOFRAN -ODT) 8 MG disintegrating tablet Take by mouth.     pantoprazole  (PROTONIX ) 40 MG tablet TAKE 1 TABLET (40 MG TOTAL) BY MOUTH DAILY. TAKE 30-60 MIN BEFORE FIRST MEAL OF THE DAY 90 tablet 0   potassium chloride  SA (KLOR-CON  M) 20 MEQ tablet Take 1 tablet (20 mEq total) by mouth 2 (two) times daily. 6 tablet 0   prazosin  (MINIPRESS ) 1 MG capsule Take by mouth.     predniSONE  (STERAPRED UNI-PAK 48 TAB) 10 MG (48) TBPK tablet Take by mouth as directed.     QUEtiapine  (SEROQUEL ) 100 MG tablet      rizatriptan  (MAXALT -MLT) 10 MG disintegrating tablet      scopolamine  (TRANSDERM-SCOP) 1 MG/3DAYS PLACE 1 PATCH ONTO THE SKIN EVERY 3 DAYS. 10 patch 12   Thiamine HCl (VITAMIN B-1) 250 MG tablet Take by mouth.     topiramate  (TOPAMAX ) 25 MG tablet Take 1 tablet (25 mg total) by mouth at bedtime. 180 tablet 1   tretinoin  (RETIN-A ) 0.025 % cream Apply pea sized amount to face qhs for acne (Patient not taking: Reported on 01/19/2024) 45 g 11   triamcinolone  (KENALOG ) 0.025 % ointment Apply 1 Application topically 2 (two) times daily. 30 g 2   TUBERCULIN SYR 1CC/26GX3/8 26G X 3/8 1 ML MISC 1 each by Does not apply route once a week. 25 each 0   Ubrogepant  (UBRELVY ) 100 MG TABS Take 1 tablet (100 mg total) by mouth as needed. May repeat after 2 hours.  Maximum 2 tablets in 24 hours. 10 tablet 11   Vitamin D , Ergocalciferol , (DRISDOL ) 1.25 MG (50000 UNIT) CAPS capsule Take 1 capsule (50,000 Units total) by mouth every 7 (seven)  days. 7 capsule 0   zonisamide  (ZONEGRAN ) 100  MG capsule Take by mouth.     No current facility-administered medications on file prior to visit.    ALLERGIES: Allergies  Allergen Reactions   Ferumoxytol Anaphylaxis    ~5 years ago. Tolerated IV venofer   Phenytoin Anaphylaxis    Other reaction(s): swelling and heart rate decreased   Pecan Nut (Diagnostic) Other (See Comments)   Phenylbutazones Other (See Comments)    FAMILY HISTORY: Family History  Problem Relation Age of Onset   High blood pressure Mother    Rheum arthritis Mother    Diabetes Father    Diabetes Sister    Heart Problems Sister    Diabetes Brother    Diabetes Paternal Grandmother    Diabetes Other        father's side everybody died from Diabetes   High blood pressure Other        mother's side, multiple siblings with this    Heart attack Other        family member on mother's side    Diabetes Paternal Aunt    Seizures Cousin        not sibings to the other cousins with seizures   Breast cancer Cousin    Seizures Cousin        not sibings to the other cousins with seizures   Seizures Cousin        not sibings to the other cousins with seizures   Cervical cancer Maternal Aunt    Breast cancer Cousin    Dementia Maternal Aunt    Asthma Maternal Aunt    Heart Problems Maternal Grandmother    Diabetes Brother    Asthma Daughter    Angioedema Daughter    Asthma Son       Objective:  *** General: No acute distress.  Patient appears well-groomed.   ***    Juliene Dunnings, DO  CC: Ronnald ONEIDA Gelineau, PA-C

## 2024-02-04 ENCOUNTER — Ambulatory Visit: Admitting: Neurology

## 2024-02-15 ENCOUNTER — Other Ambulatory Visit: Payer: Self-pay

## 2024-02-15 MED ORDER — BACLOFEN 10 MG PO TABS: 10.0000 mg | ORAL_TABLET | Freq: Three times a day (TID) | ORAL | 12 refills | Status: AC

## 2024-02-15 NOTE — Progress Notes (Deleted)
 NEUROLOGY FOLLOW UP OFFICE NOTE  Elizabeth Bautista [id number]  Assessment/Plan:   Migraine without aura, without status migrainosus, not intractable   Migraine prevention:  Aimovig  140mg  every 4 weeks  Migraine rescue:  It appears that Ubrelvy  isn't formulary.  Will provide samples of Nurtec Limit use of pain relievers to no more than 2 days out of week to prevent risk of rebound or medication-overuse headache. Keep headache diary Follow up 6 months       Subjective:  Elizabeth Bautista is a 48 year old right-handed female with asthma. HTN, DM II, RA, iron-deficiency anemia, sleep apnea and pseudoseizures who follows up for migraines.  UPDATE: *** Intensity:  severe Duration:  15 minutes with Ubrelvy .  Nurtec ?  *** ubrelvy  formulary Frequency:  3 to 4 days a week.    Current NSAIDS/analgesics:  oxycodone  (chronic pain), Mobic  (chronic pain) Current triptans:  none Current ergotamine:  none Current anti-emetic:  Reglan 10mg , Zofran -ODT 8mg  Current muscle relaxants:  baclofen  rotating with Flexeril  Current Antihypertensive medications:  metoprolol  tartrate, amlodipine , hydrochlorothiazide , Lasix  Current Antidepressant medications:  amitriptyline  50mg  at bedtime, duloxetine  60mg  daily, mirtazapine  30mg   Current Anticonvulsant medications:  topiramate  50mg  QHS (for chronic pain by her pain doctor) Current anti-CGRP:  Aimovig  140mg , Ubrelvy  100mg  Current Antihistamines/Decongestants:  Zyrtec , meclizine, scopolamine  Other therapy:  none Hormone/birth control:  none Other medications:  buspirone , methotrexate , Lunesta , Belbuca , hydroxyzine   Caffeine:  no Alcohol:  occasional Smoker:  no Diet:  Drinks a lot of water.  Planning to have bariatric surgery Exercise:  she does but limited due to chronic pain (RA and fibromyalgia) Depression:  yes; Anxiety:  yes.  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 CT head on 03/10/2020 personally  reviewed normal.  EEG on 03/12/2020 normal. Other pain:  Chronic pain syndrome - has RA but also underlying fibromyalgia suspected.  Followed by physical medicine and rehab Sleep hygiene:  Poor sleep.  May be related to stress.  She has OSA.  CPAP recently adjusted.   HISTORY: History of migraines since childhood but resolved in young adulthood.  They returned around 2021 but progressively has gotten worse.  Severe diffuse squeezing and stabbing headache including the neck.  Back of neck gets stiff.  Nausea, vomiting, blurred vision/or vision goes dark; She saw an ophthalmologist and had an unremarkable eye exam.  Sometimes bilateral hand tingling, photophobia and phonophobia.  Usually last all day.  They occur usually 4 days a week.  No known triggers.  Nothing helps them.  She also reports short term memory problems in 2021 as well.  She says she will sometimes leave the stove on or may forget conversations.  Prior thyroid tests okay.  Clemens a couple of weeks ago and hit the back of her head.  Has a lump on back of head.  MRI of brain with and without contrast on 09/24/2021 was normal.      Past NSAIDS/analgesics:  tramadol , ibuprofen , naproxen  Past abortive triptans:  sumatriptan  tab, rizatriptan  Past abortive ergotamine:  none Past muscle relaxants:  Flexeril  Past anti-emetic: Promethazine  Past antihypertensive medications:  amlodipine , Lasix , HCTZ.  Beta blockers contraindicated due to severe asthma.   Past antidepressant medications:  duloxetine , escitalopram  Past anticonvulsant medications:  Keppra , gabapentin , pregablin, zonisamide  Past anti-CGRP:  none Past antihistamines/decongestants:  Flonase  Other past therapies:  none    Family history of headache:  Mother (migraines)    PAST MEDICAL HISTORY: Past Medical History:  Diagnosis Date   Acute respiratory failure with  hypoxia (HCC)    Anemia    Asthma    COVID-19 virus infection 05/16/2020   04/27/20 dx covid-19, not  hospitalized   Diabetes (HCC)    on Metformin    Dysplastic nevus 09/24/2022   R ant sole - severe, Excised 10/14/22   Eczema    Edema    Heart problem    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 4 mL 2   albuterol  (PROAIR  HFA) 108 (90 Base) MCG/ACT inhaler Inhale 2 puffs into the lungs every 6 (six) hours as needed for wheezing or shortness of breath. 2 puffs every 4 hours as needed only  if your can't catch your breath 18 g 11   albuterol  (PROVENTIL ) (2.5 MG/3ML) 0.083% nebulizer solution Take 3 mLs (2.5 mg total) by nebulization every 6 (six) hours as needed for wheezing or shortness of breath. 360 mL 5   amitriptyline  (ELAVIL ) 50 MG tablet Take 1 tablet (50 mg total) by mouth at bedtime. 90 tablet 3   amLODipine  (NORVASC ) 5 MG tablet Take by mouth.     Ascorbic Acid (VITAMIN C) 1000 MG tablet Take 1,000 mg by mouth daily.     baclofen  (LIORESAL ) 10 MG tablet Take 10 mg by mouth.     blood glucose meter kit and supplies Dispense based on patient and insurance preference. Use up to four times daily as directed. (FOR ICD-10 E10.9, E11.9). 1 each 0   Buprenorphine  HCl (BELBUCA ) 75 MCG FILM Place 1 Film inside cheek 2 (two) times daily. 60 each 3   busPIRone  (BUSPAR ) 7.5 MG tablet Take 7.5 mg by mouth daily.     cariprazine (VRAYLAR) 1.5 MG capsule Take by mouth.     cetirizine  (ZYRTEC ) 10 MG tablet TAKE 1 TABLET BY MOUTH EVERY DAY 90 tablet 1   colestipol (COLESTID) 1 g tablet Take 2 g by mouth daily.     cyclobenzaprine  (FLEXERIL ) 5 MG tablet Take 1 tablet (5 mg total) by mouth 3 (three) times daily  as needed for muscle spasms. 270 tablet 0   doxycycline (MONODOX) 100 MG capsule Take 100 mg by mouth 2 (two) times daily.     DULoxetine  (CYMBALTA ) 60 MG capsule Take by mouth.     EPINEPHrine  0.3 mg/0.3 mL IJ SOAJ injection Inject 0.3 mg into the muscle as needed for anaphylaxis. (Patient not taking: Reported on 01/19/2024) 1 each 1   Erenumab -aooe (AIMOVIG ) 140 MG/ML SOAJ Inject 140 mg into the skin every 30 (thirty) days. 1.12 mL 11   eszopiclone  (LUNESTA ) 1 MG TABS tablet Take 1 tablet (1 mg total) by mouth at bedtime as needed for sleep. Take immediately before bedtime 30 tablet 3   famotidine  (PEPCID ) 20 MG tablet Take 1 tablet (20 mg total) by mouth daily. 30 tablet 2   Fluticasone -Umeclidin-Vilant (TRELEGY ELLIPTA ) 200-62.5-25 MCG/ACT AEPB Inhale 1 puff into the lungs daily. 60 each 5   folic acid  (FOLVITE ) 1 MG tablet Take 1 tablet (1 mg total) by mouth daily. 90 tablet 3   furosemide  (LASIX ) 20  MG tablet Take 1 tablet (20 mg total) by mouth daily as needed. 30 tablet 3   hydrochlorothiazide  (HYDRODIURIL ) 25 MG tablet Take by mouth.     hydrOXYzine  (ATARAX ) 25 MG tablet Take 25 mg by mouth 3 (three) times daily as needed.     ibuprofen  (ADVIL ) 800 MG tablet Take 1 tablet (800 mg total) by mouth every 8 (eight) hours as needed. (Patient not taking: Reported on 01/19/2024) 90 tablet 0   ipratropium-albuterol  (DUONEB) 0.5-2.5 (3) MG/3ML SOLN Take 3 mLs by nebulization every 6 (six) hours as needed. 360 mL 11   lidocaine  (LIDODERM ) 5 % Place 1 patch onto the skin daily. Remove & Discard patch within 12 hours or as directed by MD 30 patch 0   meclizine (ANTIVERT) 12.5 MG tablet TAKE ONE TABLET (12.5 MG DOSE) BY MOUTH 3 (THREE) TIMES A DAY AS NEEDED FOR NAUSEA.     methocarbamol  (ROBAXIN ) 500 MG tablet Take 1 tablet (500 mg total) by mouth 4 (four) times daily. 120 tablet 3   methotrexate  50 MG/2ML injection Inject 0.6 mLs (15 mg total) into the skin once a week. 4 mL 2   metoCLOPramide (REGLAN)  10 MG tablet Take by mouth.     metoprolol  tartrate (LOPRESSOR ) 50 MG tablet Take by mouth.     mirtazapine  (REMERON ) 30 MG tablet Take by mouth.     montelukast  (SINGULAIR ) 10 MG tablet TAKE 1 TABLET BY MOUTH EVERYDAY AT BEDTIME 30 tablet 11   Multiple Vitamin (MULTIVITAMIN) tablet Take 1 tablet by mouth daily.     mupirocin  ointment (BACTROBAN ) 2 % Apply 1 Application topically daily. Qd to excision site 22 g 1   omeprazole  (PRILOSEC) 40 MG capsule Take by mouth.     ondansetron  (ZOFRAN -ODT) 8 MG disintegrating tablet Take by mouth.     pantoprazole  (PROTONIX ) 40 MG tablet TAKE 1 TABLET (40 MG TOTAL) BY MOUTH DAILY. TAKE 30-60 MIN BEFORE FIRST MEAL OF THE DAY 90 tablet 0   potassium chloride  SA (KLOR-CON  M) 20 MEQ tablet Take 1 tablet (20 mEq total) by mouth 2 (two) times daily. 6 tablet 0   prazosin  (MINIPRESS ) 1 MG capsule Take by mouth.     predniSONE  (STERAPRED UNI-PAK 48 TAB) 10 MG (48) TBPK tablet Take by mouth as directed.     QUEtiapine  (SEROQUEL ) 100 MG tablet      rizatriptan  (MAXALT -MLT) 10 MG disintegrating tablet      scopolamine  (TRANSDERM-SCOP) 1 MG/3DAYS PLACE 1 PATCH ONTO THE SKIN EVERY 3 DAYS. 10 patch 12   Thiamine HCl (VITAMIN B-1) 250 MG tablet Take by mouth.     topiramate  (TOPAMAX ) 25 MG tablet Take 1 tablet (25 mg total) by mouth at bedtime. 180 tablet 1   tretinoin  (RETIN-A ) 0.025 % cream Apply pea sized amount to face qhs for acne (Patient not taking: Reported on 01/19/2024) 45 g 11   triamcinolone  (KENALOG ) 0.025 % ointment Apply 1 Application topically 2 (two) times daily. 30 g 2   TUBERCULIN SYR 1CC/26GX3/8 26G X 3/8 1 ML MISC 1 each by Does not apply route once a week. 25 each 0   Ubrogepant  (UBRELVY ) 100 MG TABS Take 1 tablet (100 mg total) by mouth as needed. May repeat after 2 hours.  Maximum 2 tablets in 24 hours. 10 tablet 11   Vitamin D , Ergocalciferol , (DRISDOL ) 1.25 MG (50000 UNIT) CAPS capsule Take 1 capsule (50,000 Units total) by mouth every 7 (seven)  days. 7 capsule 0   zonisamide  (ZONEGRAN ) 100  MG capsule Take by mouth.     No current facility-administered medications on file prior to visit.    ALLERGIES: Allergies  Allergen Reactions   Ferumoxytol Anaphylaxis    ~5 years ago. Tolerated IV venofer   Phenytoin Anaphylaxis    Other reaction(s): swelling and heart rate decreased   Pecan Nut (Diagnostic) Other (See Comments)   Phenylbutazones Other (See Comments)    FAMILY HISTORY: Family History  Problem Relation Age of Onset   High blood pressure Mother    Rheum arthritis Mother    Diabetes Father    Diabetes Sister    Heart Problems Sister    Diabetes Brother    Diabetes Paternal Grandmother    Diabetes Other        father's side everybody died from Diabetes   High blood pressure Other        mother's side, multiple siblings with this    Heart attack Other        family member on mother's side    Diabetes Paternal Aunt    Seizures Cousin        not sibings to the other cousins with seizures   Breast cancer Cousin    Seizures Cousin        not sibings to the other cousins with seizures   Seizures Cousin        not sibings to the other cousins with seizures   Cervical cancer Maternal Aunt    Breast cancer Cousin    Dementia Maternal Aunt    Asthma Maternal Aunt    Heart Problems Maternal Grandmother    Diabetes Brother    Asthma Daughter    Angioedema Daughter    Asthma Son       Objective:  *** General: No acute distress.  Patient appears well-groomed.   ***    Juliene Dunnings, DO  CC: Ronnald ONEIDA Gelineau, PA-C

## 2024-02-16 ENCOUNTER — Ambulatory Visit: Admitting: Neurology

## 2024-02-17 ENCOUNTER — Encounter: Attending: Physical Medicine and Rehabilitation | Admitting: Physical Medicine and Rehabilitation

## 2024-02-17 DIAGNOSIS — R197 Diarrhea, unspecified: Secondary | ICD-10-CM | POA: Diagnosis not present

## 2024-02-17 DIAGNOSIS — M5416 Radiculopathy, lumbar region: Secondary | ICD-10-CM | POA: Diagnosis not present

## 2024-02-17 MED ORDER — BELBUCA 150 MCG BU FILM: 1.0000 | ORAL_FILM | Freq: Two times a day (BID) | BUCCAL | 3 refills | Status: AC

## 2024-02-17 MED ORDER — LIDOCAINE 5 % EX PTCH: 2.0000 | MEDICATED_PATCH | CUTANEOUS | 3 refills | Status: AC

## 2024-02-17 MED ORDER — METHOCARBAMOL 500 MG PO TABS: 500.0000 mg | ORAL_TABLET | Freq: Four times a day (QID) | ORAL | 3 refills | Status: AC
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
## 2024-02-19 ENCOUNTER — Other Ambulatory Visit: Payer: Self-pay

## 2024-02-22 ENCOUNTER — Other Ambulatory Visit: Payer: Self-pay

## 2024-02-22 NOTE — Progress Notes (Signed)
 Specialty Pharmacy Refill Coordination Note  Elizabeth Bautista is a 48 y.o. female contacted today regarding refills of specialty medication(s) Abatacept  (Orencia  [employer])   Patient requested Delivery   Delivery date: 02/24/24   Verified address: [address] [place]   Medication will be filled on 09.23.25.

## 2024-03-04 ENCOUNTER — Other Ambulatory Visit (HOSPITAL_COMMUNITY): Payer: Self-pay

## 2024-03-04 ENCOUNTER — Other Ambulatory Visit: Payer: Self-pay

## 2024-03-04 NOTE — Progress Notes (Signed)
 [employer] called regarding shipment of replacement Orencia  to confirm pharmacy address. Medication to arrive at [facility] in 2-3 business days.  Reference numbers: [id number] and [id number]  Added manufacturer replacement task.

## 2024-03-07 HISTORY — PX: ESOPHAGEAL DILATION: SHX303

## 2024-03-09 ENCOUNTER — Other Ambulatory Visit (HOSPITAL_COMMUNITY): Payer: Self-pay

## 2024-03-09 ENCOUNTER — Other Ambulatory Visit: Payer: Self-pay

## 2024-03-09 NOTE — Progress Notes (Signed)
 Attempted to reach patient. Mailbox full.  Orencia  replacement from [employer] received at [facility]. Does patient want med mail or pick up at [facility]?

## 2024-03-09 NOTE — Progress Notes (Deleted)
 NEUROLOGY FOLLOW UP OFFICE NOTE  Elizabeth Bautista [id number]  Assessment/Plan:   Migraine without aura, without status migrainosus, not intractable   Migraine prevention:  Aimovig  140mg  every 4 weeks  Migraine rescue:  It appears that Ubrelvy  isn't formulary.  Will provide samples of Nurtec Limit use of pain relievers to no more than 2 days out of week to prevent risk of rebound or medication-overuse headache. Keep headache diary Follow up 6 months       Subjective:  Elizabeth Bautista is a 48 year old right-handed female with asthma. HTN, DM II, RA, iron-deficiency anemia, sleep apnea and pseudoseizures who follows up for migraines.  UPDATE: *** Intensity:  severe Duration:  15 minutes with Ubrelvy .  Nurtec ?  *** ubrelvy  formulary Frequency:  3 to 4 days a week.    Current NSAIDS/analgesics:  oxycodone  (chronic pain), Mobic  (chronic pain) Current triptans:  none Current ergotamine:  none Current anti-emetic:  Reglan 10mg , Zofran -ODT 8mg  Current muscle relaxants:  baclofen  rotating with Flexeril  Current Antihypertensive medications:  metoprolol  tartrate, amlodipine , hydrochlorothiazide , Lasix  Current Antidepressant medications:  amitriptyline  50mg  at bedtime, duloxetine  60mg  daily, mirtazapine  30mg   Current Anticonvulsant medications:  topiramate  50mg  QHS (for chronic pain by her pain doctor) Current anti-CGRP:  Aimovig  140mg , Ubrelvy  100mg  Current Antihistamines/Decongestants:  Zyrtec , meclizine, scopolamine  Other therapy:  none Hormone/birth control:  none Other medications:  buspirone , methotrexate , Lunesta , Belbuca , hydroxyzine   Caffeine:  no Alcohol:  occasional Smoker:  no Diet:  Drinks a lot of water.  Planning to have bariatric surgery Exercise:  she does but limited due to chronic pain (RA and fibromyalgia) Depression:  yes; Anxiety:  yes.  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 CT head on 03/10/2020 personally  reviewed normal.  EEG on 03/12/2020 normal. Other pain:  Chronic pain syndrome - has RA but also underlying fibromyalgia suspected.  Followed by physical medicine and rehab Sleep hygiene:  Poor sleep.  May be related to stress.  She has OSA.  CPAP recently adjusted.   HISTORY: History of migraines since childhood but resolved in young adulthood.  They returned around 2021 but progressively has gotten worse.  Severe diffuse squeezing and stabbing headache including the neck.  Back of neck gets stiff.  Nausea, vomiting, blurred vision/or vision goes dark; She saw an ophthalmologist and had an unremarkable eye exam.  Sometimes bilateral hand tingling, photophobia and phonophobia.  Usually last all day.  They occur usually 4 days a week.  No known triggers.  Nothing helps them.  She also reports short term memory problems in 2021 as well.  She says she will sometimes leave the stove on or may forget conversations.  Prior thyroid tests okay.  Clemens a couple of weeks ago and hit the back of her head.  Has a lump on back of head.  MRI of brain with and without contrast on 09/24/2021 was normal.      Past NSAIDS/analgesics:  tramadol , ibuprofen , naproxen  Past abortive triptans:  sumatriptan  tab, rizatriptan  Past abortive ergotamine:  none Past muscle relaxants:  Flexeril  Past anti-emetic: Promethazine  Past antihypertensive medications:  amlodipine , Lasix , HCTZ.  Beta blockers contraindicated due to severe asthma.   Past antidepressant medications:  duloxetine , escitalopram  Past anticonvulsant medications:  Keppra , gabapentin , pregablin, zonisamide  Past anti-CGRP:  none Past antihistamines/decongestants:  Flonase  Other past therapies:  none    Family history of headache:  Mother (migraines)    PAST MEDICAL HISTORY: Past Medical History:  Diagnosis Date   Acute respiratory failure with  hypoxia (HCC)    Anemia    Asthma    COVID-19 virus infection 05/16/2020   04/27/20 dx covid-19, not  hospitalized   Diabetes (HCC)    on Metformin    Dysplastic nevus 09/24/2022   R ant sole - severe, Excised 10/14/22   Eczema    Edema    Heart problem    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 4 mL 2   albuterol  (PROAIR  HFA) 108 (90 Base) MCG/ACT inhaler Inhale 2 puffs into the lungs every 6 (six) hours as needed for wheezing or shortness of breath. 2 puffs every 4 hours as needed only  if your can't catch your breath 18 g 11   albuterol  (PROVENTIL ) (2.5 MG/3ML) 0.083% nebulizer solution Take 3 mLs (2.5 mg total) by nebulization every 6 (six) hours as needed for wheezing or shortness of breath. 360 mL 5   amitriptyline  (ELAVIL ) 50 MG tablet Take 1 tablet (50 mg total) by mouth at bedtime. 90 tablet 3   amLODipine  (NORVASC ) 5 MG tablet Take by mouth.     Ascorbic Acid (VITAMIN C) 1000 MG tablet Take 1,000 mg by mouth daily.     baclofen  (LIORESAL ) 10 MG tablet Take 1 tablet (10 mg total) by mouth 3 (three) times daily. 30 each 12   blood glucose meter kit and supplies Dispense based on patient and insurance preference. Use up to four times daily as directed. (FOR ICD-10 E10.9, E11.9). 1 each 0   Buprenorphine  HCl (BELBUCA ) 150 MCG FILM Place 1 Film inside cheek 2 (two) times daily. 60 each 3   busPIRone  (BUSPAR ) 7.5 MG tablet Take 7.5 mg by mouth daily.     cariprazine (VRAYLAR) 1.5 MG capsule Take by mouth.     cetirizine  (ZYRTEC ) 10 MG tablet TAKE 1 TABLET BY MOUTH EVERY DAY 90 tablet 1   colestipol (COLESTID) 1 g tablet Take 2 g by mouth daily.     cyclobenzaprine  (FLEXERIL ) 5 MG tablet Take 1  tablet (5 mg total) by mouth 3 (three) times daily as needed for muscle spasms. 270 tablet 0   doxycycline (MONODOX) 100 MG capsule Take 100 mg by mouth 2 (two) times daily.     DULoxetine  (CYMBALTA ) 60 MG capsule Take by mouth.     EPINEPHrine  0.3 mg/0.3 mL IJ SOAJ injection Inject 0.3 mg into the muscle as needed for anaphylaxis. (Patient not taking: Reported on 01/19/2024) 1 each 1   Erenumab -aooe (AIMOVIG ) 140 MG/ML SOAJ Inject 140 mg into the skin every 30 (thirty) days. 1.12 mL 11   eszopiclone  (LUNESTA ) 1 MG TABS tablet Take 1 tablet (1 mg total) by mouth at bedtime as needed for sleep. Take immediately before bedtime 30 tablet 3   famotidine  (PEPCID ) 20 MG tablet Take 1 tablet (20 mg total) by mouth daily. 30 tablet 2   Fluticasone -Umeclidin-Vilant (TRELEGY ELLIPTA ) 200-62.5-25 MCG/ACT AEPB Inhale 1 puff into the lungs daily. 60 each 5   folic acid  (FOLVITE ) 1 MG tablet Take 1 tablet (1 mg total) by mouth daily.  90 tablet 3   furosemide  (LASIX ) 20 MG tablet Take 1 tablet (20 mg total) by mouth daily as needed. 30 tablet 3   hydrochlorothiazide  (HYDRODIURIL ) 25 MG tablet Take by mouth.     hydrOXYzine  (ATARAX ) 25 MG tablet Take 25 mg by mouth 3 (three) times daily as needed.     ibuprofen  (ADVIL ) 800 MG tablet Take 1 tablet (800 mg total) by mouth every 8 (eight) hours as needed. (Patient not taking: Reported on 01/19/2024) 90 tablet 0   ipratropium-albuterol  (DUONEB) 0.5-2.5 (3) MG/3ML SOLN Take 3 mLs by nebulization every 6 (six) hours as needed. 360 mL 11   lidocaine  (LIDODERM ) 5 % Place 2 patches onto the skin daily. Remove & Discard patch within 12 hours or as directed by MD 60 patch 3   meclizine (ANTIVERT) 12.5 MG tablet TAKE ONE TABLET (12.5 MG DOSE) BY MOUTH 3 (THREE) TIMES A DAY AS NEEDED FOR NAUSEA.     methocarbamol  (ROBAXIN ) 500 MG tablet Take 1 tablet (500 mg total) by mouth 4 (four) times daily. 120 tablet 3   methotrexate  50 MG/2ML injection Inject 0.6 mLs (15 mg total) into the  skin once a week. 4 mL 2   metoCLOPramide (REGLAN) 10 MG tablet Take by mouth.     metoprolol  tartrate (LOPRESSOR ) 50 MG tablet Take by mouth.     mirtazapine  (REMERON ) 30 MG tablet Take by mouth.     montelukast  (SINGULAIR ) 10 MG tablet TAKE 1 TABLET BY MOUTH EVERYDAY AT BEDTIME 30 tablet 11   Multiple Vitamin (MULTIVITAMIN) tablet Take 1 tablet by mouth daily.     mupirocin  ointment (BACTROBAN ) 2 % Apply 1 Application topically daily. Qd to excision site 22 g 1   omeprazole  (PRILOSEC) 40 MG capsule Take by mouth.     ondansetron  (ZOFRAN -ODT) 8 MG disintegrating tablet Take by mouth.     pantoprazole  (PROTONIX ) 40 MG tablet TAKE 1 TABLET (40 MG TOTAL) BY MOUTH DAILY. TAKE 30-60 MIN BEFORE FIRST MEAL OF THE DAY 90 tablet 0   potassium chloride  SA (KLOR-CON  M) 20 MEQ tablet Take 1 tablet (20 mEq total) by mouth 2 (two) times daily. 6 tablet 0   prazosin  (MINIPRESS ) 1 MG capsule Take by mouth.     predniSONE  (STERAPRED UNI-PAK 48 TAB) 10 MG (48) TBPK tablet Take by mouth as directed.     QUEtiapine  (SEROQUEL ) 100 MG tablet      rizatriptan  (MAXALT -MLT) 10 MG disintegrating tablet      scopolamine  (TRANSDERM-SCOP) 1 MG/3DAYS PLACE 1 PATCH ONTO THE SKIN EVERY 3 DAYS. 10 patch 12   Thiamine HCl (VITAMIN B-1) 250 MG tablet Take by mouth.     topiramate  (TOPAMAX ) 25 MG tablet Take 1 tablet (25 mg total) by mouth at bedtime. 180 tablet 1   tretinoin  (RETIN-A ) 0.025 % cream Apply pea sized amount to face qhs for acne (Patient not taking: Reported on 01/19/2024) 45 g 11   triamcinolone  (KENALOG ) 0.025 % ointment Apply 1 Application topically 2 (two) times daily. 30 g 2   TUBERCULIN SYR 1CC/26GX3/8 26G X 3/8 1 ML MISC 1 each by Does not apply route once a week. 25 each 0   Ubrogepant  (UBRELVY ) 100 MG TABS Take 1 tablet (100 mg total) by mouth as needed. May repeat after 2 hours.  Maximum 2 tablets in 24 hours. 10 tablet 11   Vitamin D , Ergocalciferol , (DRISDOL ) 1.25 MG (50000 UNIT) CAPS capsule Take 1  capsule (50,000 Units total) by mouth every 7 (seven) days.  7 capsule 0   zonisamide  (ZONEGRAN ) 100 MG capsule Take by mouth.     No current facility-administered medications on file prior to visit.    ALLERGIES: Allergies  Allergen Reactions   Ferumoxytol Anaphylaxis    ~5 years ago. Tolerated IV venofer   Phenytoin Anaphylaxis    Other reaction(s): swelling and heart rate decreased   Pecan Nut (Diagnostic) Other (See Comments)   Phenylbutazones Other (See Comments)    FAMILY HISTORY: Family History  Problem Relation Age of Onset   High blood pressure Mother    Rheum arthritis Mother    Diabetes Father    Diabetes Sister    Heart Problems Sister    Diabetes Brother    Diabetes Paternal Grandmother    Diabetes Other        father's side everybody died from Diabetes   High blood pressure Other        mother's side, multiple siblings with this    Heart attack Other        family member on mother's side    Diabetes Paternal Aunt    Seizures Cousin        not sibings to the other cousins with seizures   Breast cancer Cousin    Seizures Cousin        not sibings to the other cousins with seizures   Seizures Cousin        not sibings to the other cousins with seizures   Cervical cancer Maternal Aunt    Breast cancer Cousin    Dementia Maternal Aunt    Asthma Maternal Aunt    Heart Problems Maternal Grandmother    Diabetes Brother    Asthma Daughter    Angioedema Daughter    Asthma Son       Objective:  *** General: No acute distress.  Patient appears well-groomed.   ***    Juliene Dunnings, DO  CC: Ronnald ONEIDA Gelineau, PA-C

## 2024-03-10 ENCOUNTER — Ambulatory Visit: Admitting: Neurology

## 2024-03-10 ENCOUNTER — Encounter (HOSPITAL_COMMUNITY): Payer: Self-pay

## 2024-03-10 ENCOUNTER — Other Ambulatory Visit (HOSPITAL_COMMUNITY): Payer: Self-pay

## 2024-03-11 ENCOUNTER — Encounter: Admitting: Physical Medicine and Rehabilitation

## 2024-03-11 ENCOUNTER — Other Ambulatory Visit: Payer: Self-pay

## 2024-03-14 ENCOUNTER — Other Ambulatory Visit (HOSPITAL_COMMUNITY): Payer: Self-pay

## 2024-03-14 ENCOUNTER — Other Ambulatory Visit: Payer: Self-pay

## 2024-03-14 NOTE — Progress Notes (Signed)
 Specialty Pharmacy Refill Coordination Note  Elizabeth Bautista is a 48 y.o. female contacted today regarding refills of specialty medication(s) Abatacept  ([place]  [employer])   Patient requested Delivery   Delivery date: 03/16/24   Verified address: [address]  [place]   Medication will be filled on 03/15/24.

## 2024-03-15 ENCOUNTER — Ambulatory Visit: Admitting: Dermatology

## 2024-03-15 ENCOUNTER — Other Ambulatory Visit: Payer: Self-pay

## 2024-03-16 ENCOUNTER — Other Ambulatory Visit: Payer: Self-pay

## 2024-03-17 ENCOUNTER — Other Ambulatory Visit: Payer: Self-pay | Admitting: Internal Medicine

## 2024-03-17 ENCOUNTER — Telehealth: Payer: Self-pay | Admitting: Internal Medicine

## 2024-03-17 DIAGNOSIS — Z9225 Personal history of immunosupression therapy: Secondary | ICD-10-CM

## 2024-03-17 DIAGNOSIS — M059 Rheumatoid arthritis with rheumatoid factor, unspecified: Secondary | ICD-10-CM

## 2024-03-17 DIAGNOSIS — Z111 Encounter for screening for respiratory tuberculosis: Secondary | ICD-10-CM

## 2024-03-17 DIAGNOSIS — Z79899 Other long term (current) drug therapy: Secondary | ICD-10-CM

## 2024-03-17 NOTE — Telephone Encounter (Signed)
 Lab Orders released.

## 2024-03-17 NOTE — Telephone Encounter (Signed)
 Last Fill: 01/19/2024  Labs: 11/25/2023 RBC 3.45, Hgb 8.1, Hct 25.3, MCV 73.3, MCH 23.5, MCHC 32, RDW 16.7, CMP 09/15/2023 Creat. 0.47, Calcium 8.0, Total Protein 5.7, Alk. Phos 194  Next Visit: 04/22/2024  Last Visit: 01/19/2024  DX:  Seropositive rheumatoid arthritis    Current Dose per office note 01/19/2024: methotrexate  0.6 mL [place] weekly   Left message to advise patient she is due to update labs.   Okay to refill Methotrexate ?

## 2024-03-17 NOTE — Telephone Encounter (Signed)
 Patient contacted the office requesting lab orders to be be release to [facility] in [place].   Patient plans to have labs on tomorrow, 03/18/24 at 8:00 am.

## 2024-04-06 ENCOUNTER — Other Ambulatory Visit: Payer: Self-pay

## 2024-04-06 ENCOUNTER — Other Ambulatory Visit (HOSPITAL_COMMUNITY): Payer: Self-pay

## 2024-04-06 ENCOUNTER — Other Ambulatory Visit: Payer: Self-pay | Admitting: Internal Medicine

## 2024-04-06 DIAGNOSIS — Z79899 Other long term (current) drug therapy: Secondary | ICD-10-CM

## 2024-04-06 DIAGNOSIS — M059 Rheumatoid arthritis with rheumatoid factor, unspecified: Secondary | ICD-10-CM

## 2024-04-08 ENCOUNTER — Other Ambulatory Visit: Payer: Self-pay

## 2024-04-11 NOTE — Progress Notes (Signed)
 Office Visit Note  Patient: Elizabeth Bautista             Date of Birth: [birth year]           MRN: [id number]             PCP: Vonzell Ronnald ONEIDA DEVONNA Referring: Vonzell Ronnald ONEIDA DEVONNA Visit Date: 04/22/2024   Subjective:  Medication Management (Increased pain , patient is not taking the methotrexate  an folic acid  . ) and Joint Swelling (Right hand has gotten worse as well as both legs.)   History of Present Illness: Elizabeth Bautista is a 48 y.o. female here for follow up for seropositive rheumatoid arthritis on Orencia  125 mg [place] weekly. She discontinued methotrexate  injections since about 1 month ago because she was having mild side effects and also felt there was limited improvement in symptoms even on the higher dose.  Since stopping methotrexate  she has not noticed any worsening problem. Currently her most severe symptoms are pain and swelling affecting her right hand and wrist.  This is associated with numbness in the hand and gets pain radiation from the wrist into the hand as well as upper arm sometimes all the way to the shoulder.  She did not recall any particular injury or change in activity associated with this increase in symptoms.  She has been using a immobilizing wrist brace often during the day and overnight which is partially helpful. Also has some increase in knee pain with a small amount of swelling.  Both sides affected right is a bit worse than left knee pain but not swelling. She just had recent lab work checked out with her primary care office who was checking things due to reported increase in fatigue as well as body aches.  She had a TSH in normal range and complete blood count metabolic panel were appropriate.  Vitamin D  was severely low at 6.9 and she was just prescribed a high dose vitamin D  supplement that she has not started yet.  Previous HPI 01/19/2024 Elizabeth Bautista is a 48 y.o. female meeting today by telephone encounter here for follow up for  seropositive rheumatoid arthritis on Orencia  125 mg [place] weekly, methotrexate  0.4 mL [place] weekly, and folic acid  1 mg daily.    She reports ongoing symptoms particularly with swelling at the feet and ankles but has not experienced major flareups of her inflammatory arthritis since our last visit.  Does have morning stiffness but hard to differentiate as she has generalized pain throughout the day with combination of fibromyalgia syndrome also with lumbar degenerative disc disease and myofascial pain worse with insomnia.   She is seeing Dr. Lorilee with pain management working on medications including baclofen , Tylenol  No. 4, Savella , possibly starting low-dose naltrexone or [place] .  Also has ongoing problems with iron deficiency anemia related to blood loss.  Most recent hemoglobin low at 8.5 she had iron infusion with plan for repeat tests after 1 month.  Denies any serious interval infections.   Previous HPI 09/15/2023 Elizabeth Bautista is a 48 y.o. female here for follow up seropositive rheumatoid arthritis on Orencia  125 mg [place] weekly, methotrexate  0.4 mL [place] weekly, and folic acid  1 mg daily.    She has experienced some improvement with methotrexate , although not complete. Her hands remain the most affected area, with persistent swelling, particularly in the right hand knuckles. The swelling has decreased but is still present, impacting her daily activities. Her ankles also swell but are less painful  than before. The pain in her ankles has subsided significantly compared to her hands.   She denies any side effects from methotrexate  such as nausea or diarrhea, but has experienced weight gain, which she attributes to another medication for depression. She is currently on a low dose of methotrexate , 0.4 mg, due to previous concerns about liver enzyme levels, which have since normalized following weight loss.   She reports ongoing knee pain, though less intense than previously. She uses a knee brace,  which requires adjustment due to size changes, and notes that she does not feel the need for it as much anymore. She no longer uses a cane when not in pain.   No recent respiratory infections or significant illnesses. She reports cold hands and feet without color changes and experiences morning stiffness in her joints.         Previous HPI 04/29/2023 Elizabeth Bautista is a 48 y.o. female with seropositive rheumatoid arthritis on Orencia  125 mg [place] weekly here due to increased joint pain, particularly in the ankles, over the past month. The pain is described as so severe that it impedes the ability to walk, with some days being completely incapacitating. The patient also reports several falls due to the pain. The pain radiates from the ankles to the underside of the foot and is accompanied by swelling. The patient notes that the right ankle has sometimes been more inflamed than the other, although both are painful.   Pain management has recently switched the patient to oxycodone , which provides temporary relief but does not alleviate the persistent daily pain. Physical therapy has been attempted but was deemed ineffective due to the patient's inability to walk or move the ankles. The patient expressed a preference for aqua therapy, which previously provided more relief, but has been unable to continue due to ongoing diarrhea issues since bariatric surgery   In addition to the ankle pain, the patient reports increased swelling and pain in the right hand, to the point of being unable to close it. The patient continues to take Orencia  shots for rheumatoid arthritis management, which were effective prior to the recent increase in pain.   The patient also reports ongoing gastric issues, including persistent diarrhea and vomiting, since a recent surgery. These issues have been associated with malabsorption, leading to nutritional deficiencies and hair loss. The patient was hospitalized last month due to  these issues, and lab results indicated low levels of certain nutrients.    Previous HPI 11/07/22 Rick Zuleyka Kloc is a 48 y.o. female here for follow up for seropositive RA currently off treatment she discontinued Enbrel  treatment after she had new findings of dysplastic nevus with surgical excision on the bottom of the right foot and plan for follow-up excision of 1 on the left foot.  She stopped taking the medicine for concern of this contributing to skin cancer risk.  Previously had no known personal or family history for cancer.  Since discontinuing the Enbrel  she has increased pain and stiffness in multiple areas.  Starting to see additional swelling again in the right knee and right hand most frequently.   Previous HPI: 10/11/20 Shelton Pickney is a 47 y.o. female here for evaluation and management of rheumatoid arthritis. Symptoms started at least since 2017 with hand, wrist, and knee pain and swelling periodically and worsened over time. She did not seek medication evaluation for some time due to being very busy with work and symptoms progressively increased. She was hospitalized for severe pain and  swelling especially in the left wrist and knee and workup at that time was more extensive and revealed positive RA serology suspected as the cause. She saw rheumatology since 2020 and started treatment initial with prednisone  that did not control symptoms well but caused a lot of weight gain. She has had chronic steroid exposure due to refractory asthma symptoms over the years. She also started hydroxychloroquine  that apparently helped symptoms reasonably well initially but has worsened. She moved form [place]  a year ago due to safety circumstances and has some family in the area and is continuing HCQ treatment but overall having a lot of joint pain. Currently feels swelling and heat in the joints multiple days per week and walks with either a cane or walker for her left knee pain and instability depending  on how badly it is acting up. She was recently hospitalized for asthma exacerbation on 4/28 and discharged 5/2.   Labs reviewed 10/2018 RF ~30, CCP >250 ANA neg   Imaging reviewed Chest xray 08/2020 Cardiomegaly no airspace disease   Lumbar spine xray 08/2020 No acute findings    Review of Systems  Constitutional:  Positive for fatigue.  HENT:  Positive for mouth dryness. Negative for mouth sores.   Eyes:  Positive for dryness.  Respiratory:  Positive for shortness of breath.   Cardiovascular:  Positive for chest pain and palpitations.  Gastrointestinal:  Positive for diarrhea. Negative for blood in stool and constipation.  Endocrine: Positive for increased urination.  Genitourinary:  Positive for involuntary urination.  Musculoskeletal:  Positive for joint pain, gait problem, joint pain, joint swelling, myalgias, muscle weakness, morning stiffness, muscle tenderness and myalgias.  Skin:  Positive for rash and hair loss. Negative for color change and sensitivity to sunlight.  Allergic/Immunologic: Positive for susceptible to infections.  Neurological:  Positive for dizziness and headaches.  Hematological:  Positive for swollen glands.  Psychiatric/Behavioral:  Positive for depressed mood and sleep disturbance. The patient is nervous/anxious.     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   Mixed stress and urge  urinary incontinence 08/14/2022   Encounter for screening fecal occult blood testing 08/14/2022   Encounter for well woman exam with routine gynecological exam 08/14/2022   Decreased libido 08/14/2022   Perimenopause 08/14/2022   Screening examination for STD (sexually transmitted disease) 08/14/2022   Otalgia of both ears 07/09/2022   Vitamin B1 deficiency 04/22/2022   Gastric ulcer 02/11/2022   Laryngopharyngeal reflux (LPR) 12/26/2021   History of gastric bypass 10/21/2021   Spinal stenosis of lumbar region with neurogenic claudication 08/27/2021   Fall 08/27/2021   Sinusitis 08/11/2021   Allergic rhinitis 08/07/2021   Hospital discharge follow-up 08/02/2021   (HFpEF) heart failure with preserved ejection fraction (HCC) 07/20/2021   Body mass index (BMI) 45.0-49.9, adult (HCC) 02/05/2021   Iron deficiency anemia due to chronic blood loss 10/17/2020   Tension-type headache, not intractable 10/17/2020   High risk medication use 10/11/2020  Acute hip pain, left 09/19/2020   Sciatica 09/11/2020   Dysmenorrhea 06/04/2020   Menorrhagia with irregular cycle 06/04/2020   Anxiety and depression 06/04/2020   Physical deconditioning 05/24/2020   Severe persistent asthma (HCC) 03/08/2020   GERD (gastroesophageal reflux disease) 03/07/2020   Anxiety 03/07/2020   History of seizures 03/07/2020   Swelling of lower extremity 02/01/2020   Essential hypertension 02/01/2020   Vitamin D  deficiency 02/01/2020   Type 2 diabetes mellitus with hyperglycemia, without long-term current use of insulin  (HCC) 02/01/2020   Anemia 02/01/2020   Class 3 severe obesity with serious comorbidity and body mass index (BMI) of 45.0 to 49.9 in adult (HCC) 10/22/2019   OSA on CPAP 10/18/2019   Seropositive rheumatoid arthritis (HCC) 10/18/2019    Past Medical History:  Diagnosis Date   Acute respiratory failure with hypoxia (HCC)    Anemia    Asthma    COVID-19 virus infection 05/16/2020   04/27/20 dx  covid-19, not hospitalized   Diabetes (HCC)    on Metformin    Dysplastic nevus 09/24/2022   R ant sole - severe, Excised 10/14/22   Eczema    Edema    Heart problem    something with the arteries on the left side of the heart; upcoming appt with cardiology for evaluation   HTN (hypertension)    Rheumatoid arthritis (HCC)    on Plaquenil     Seizures (HCC)    last seizure 2019   Sleep apnea    Sleep apnea     Family History  Problem Relation Age of Onset   High blood pressure Mother    Rheum arthritis Mother    Diabetes Father    Diabetes Sister    Heart Problems Sister    Diabetes Brother    Diabetes Paternal Grandmother    Diabetes Other        father's side everybody died from Diabetes   High blood pressure Other        mother's side, multiple siblings with this    Heart attack Other        family member on mother's side    Diabetes Paternal Aunt    Seizures Cousin        not sibings to the other cousins with seizures   Breast cancer Cousin    Seizures Cousin        not sibings to the other cousins with seizures   Seizures Cousin        not sibings to the other cousins with seizures   Cervical cancer Maternal Aunt    Breast cancer Cousin    Dementia Maternal Aunt    Asthma Maternal Aunt    Heart Problems Maternal Grandmother    Diabetes Brother    Asthma Daughter    Angioedema Daughter    Asthma Son    Past Surgical History:  Procedure Laterality Date   APPENDECTOMY     CESAREAN SECTION     x3   CHOLECYSTECTOMY  02/17/2022   ESOPHAGEAL DILATION  03/07/2024   FOOT SURGERY  2024   GASTRIC BYPASS     LAPAROSCOPIC GASTROTOMY W/ REPAIR OF ULCER  02/17/2022   TUBAL LIGATION  2003   Social History   Social History Narrative   Divorced.Lives with 3 kids.Originally from [place].Came from [place] ,[place] 7 months ago.      02/27/2020   Right handed   Caffeine: none     There is no immunization history on file for this patient.  Objective: Vital Signs:  BP 108/66   Pulse 61   Temp 98.1 F (36.7 C)   Resp 16   Ht 5' 4 (1.626 m)   Wt 128 lb 3.2 oz (58.2 kg)   LMP 03/27/2024   BMI 22.01 kg/m    Physical Exam Eyes:     Conjunctiva/sclera: Conjunctivae normal.  Cardiovascular:     Rate and Rhythm: Normal rate and regular rhythm.  Pulmonary:     Effort: Pulmonary effort is normal.     Breath sounds: Normal breath sounds.  Musculoskeletal:     Right lower leg: No edema.     Left lower leg: No edema.  Lymphadenopathy:     Cervical: No cervical adenopathy.  Skin:    General: Skin is warm and dry.     Findings: No rash.  Neurological:     Mental Status: She is alert.  Psychiatric:        Mood and Affect: Mood normal.      Musculoskeletal Exam:  Shoulders full ROM no tenderness or swelling Right elbow pain with flexion and extension but minimal tenderness to pressure no palpable swelling Right wrist swelling more prominent on flexor than the extensor side, very tender to direct pressure and painful with flexion and extension range of motion Pain radiation into hand especially the right third finger and pain is severe with forced full extension of the digit, no palpable synovitis Knees full ROM, bilateral tenderness to pressure along joint line worse on right side, no palpable effusions Ankles full ROM no tenderness or swelling  Limited musculoskeletal ultrasound of the right wrist demonstrates clear tenosynovitis with soft tissue swelling and hyperemia noted deep to the flexor retinaculum and carpal tunnel space, right median nerve cross-sectional area approximately 0.15 cm  Investigation: No additional findings.  Imaging: US  Guided Needle Placement Result Date: 04/22/2024 Right wrist carpal tunnel injection Ultrasound guided injection is preferred based studies that show increased duration, increased effect, greater accuracy, decreased procedural pain, increased response rate, and decreased cost with ultrasound guided  versus blind injection. Verbal informed consent obtained.  Time-out conducted.  Noted no overlying erythema, induration, or other signs of local infection. Ultrasound-guided right carpal tunnel injection. After sterile prep with Betadine, injected 1 mL 1% lidocaine  and 40 mg kenalog  using a 27g needle by ulnar approach. Images 1-3 show advancement of needle next to median nerve. Images 4-10 show injection of medication into carpal tunnel space, with color doppler enhancement of medication flow on images 5 and 9.    Recent Labs: Lab Results  Component Value Date   WBC 4.3 09/15/2023   HGB 10.5 (L) 09/15/2023   PLT 204 09/15/2023   NA 140 09/15/2023   K 4.5 09/15/2023   CL 110 09/15/2023   CO2 27 09/15/2023   GLUCOSE 68 09/15/2023   BUN 8 09/15/2023   CREATININE 0.47 (L) 09/15/2023   BILITOT 0.3 09/15/2023   ALKPHOS 85 06/09/2022   AST 27 09/15/2023   ALT 24 09/15/2023   PROT 5.7 (L) 09/15/2023   ALBUMIN 3.2 (L) 06/09/2022   CALCIUM 8.0 (L) 09/15/2023   GFRAA 121 10/11/2020   QFTBGOLDPLUS NEGATIVE 04/29/2023    Speciality Comments: PLQ EYE EXAM 09/12/2021 [place] f/u 2-3 months Enbrel  started 01/29/11  Methotrexate  Approved Quantity: 4 units per 28 days.  Procedures:  Hand/UE Inj: R carpal tunnel for carpal tunnel syndrome on 04/22/2024 11:10 AM Indications: therapeutic Details: 27 G needle, ultrasound-guided ulnar approach Medications: 1 mL lidocaine  1 %; 40 mg triamcinolone   acetonide 40 MG/ML Outcome: tolerated well, no immediate complications Procedure, treatment alternatives, risks and benefits explained, specific risks discussed. Consent was given by the patient. Immediately prior to procedure a time out was called to verify the correct patient, procedure, equipment, support staff and site/side marked as required. Patient was prepped and draped in the usual sterile fashion.     Allergies: Ferumoxytol, Phenytoin, Pecan nut (diagnostic), and Phenylbutazones   Assessment /  Plan:     Visit Diagnoses: Seropositive rheumatoid arthritis (HCC) - Plan: Hand/UE Inj: R carpal tunnel, Abatacept  (ORENCIA  CLICKJECT) 125 MG/ML SOAJ Currently with worsened joint pain in multiple areas there is not much synovitis appreciable outside of the right wrist.  This is probably indicative of a rheumatoid arthritis flareup and is having subsequent complication with carpal tunnel syndrome due to nerve impingement.  Less likely that overuse related injury by itself would account for this given the degree of inflammation on ultrasound imaging and that she has mostly been avoiding pressure on the wrist including use of the immobilizing brace much at the time. - Continue Orencia  125 mg subcu weekly - Steroid injection for wrist inflammation - Discussed switch to oral leflunomide would start at 10 mg daily for tolerance as alternative to methotrexate  if synovitis recurs quickly  Carpal tunnel syndrome, right upper limb  Tenosynovitis of right wrist - Plan: US  Guided Needle Placement, Hand/UE Inj: R carpal tunnel Discussed symptomatic treatment including range of motion exercises wrist bracing at night and treated with ultrasound-guided steroid injection today.  High risk medication use - Plan: Abatacept  (ORENCIA  CLICKJECT) 125 MG/ML SOAJ Reviewed recent labs for primary care office including blood count and metabolic panel which were appropriate no major side effect from the Orencia .  She was having mild issues with methotrexate  but no severe drug reaction or discontinued due to lack of efficacy.   Vitamin D  deficiency Discussed severe vitamin D  deficiency of 6.9 could easily contribute to fatigue and worsened musculoskeletal symptoms.  Also discussed importance long-term for risk reduction of osteoporosis especially given her history of gastric bypass with extensive weight loss and longstanding seropositive RA.  I reviewed the importance after finishing a prescription course of vitamin D   supplement she would benefit with long-term over-the-counter daily vitamin D  and may require higher doses than normal if there is a component of GI malabsorption.  Will benefit from vitamin D  level recheck after 3 months of high-dose replacement.  Orders: Orders Placed This Encounter  Procedures   Hand/UE Inj: R carpal tunnel   US  Guided Needle Placement   Meds ordered this encounter  Medications   Abatacept  (ORENCIA  CLICKJECT) 125 MG/ML SOAJ    Sig: Inject 125 mg into the skin once a week.    Dispense:  4 mL    Refill:  2    Prescription Type::   Renewal     Follow-Up Instructions: Return in about 6 weeks (around 06/03/2024), or if symptoms worsen or fail to improve.   Lonni LELON Ester, MD  Note - This record has been created using [employer].  Chart creation errors have been sought, but may not always  have been located. Such creation errors do not reflect on  the standard of medical care.

## 2024-04-18 ENCOUNTER — Other Ambulatory Visit: Payer: Self-pay | Admitting: Registered Nurse

## 2024-04-20 ENCOUNTER — Ambulatory Visit: Admitting: Internal Medicine
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
## 2024-04-21 ENCOUNTER — Encounter: Admitting: Physical Medicine and Rehabilitation

## 2024-04-22 ENCOUNTER — Ambulatory Visit: Attending: Internal Medicine | Admitting: Internal Medicine

## 2024-04-22 ENCOUNTER — Other Ambulatory Visit: Payer: Self-pay

## 2024-04-22 ENCOUNTER — Encounter: Payer: Self-pay | Admitting: Internal Medicine

## 2024-04-22 ENCOUNTER — Ambulatory Visit

## 2024-04-22 VITALS — BP 108/66 | HR 61 | Temp 98.1°F | Resp 16 | Ht 64.0 in | Wt 128.2 lb

## 2024-04-22 DIAGNOSIS — Z79899 Other long term (current) drug therapy: Secondary | ICD-10-CM

## 2024-04-22 DIAGNOSIS — M65931 Unspecified synovitis and tenosynovitis, right forearm: Secondary | ICD-10-CM | POA: Diagnosis present

## 2024-04-22 DIAGNOSIS — G5601 Carpal tunnel syndrome, right upper limb: Secondary | ICD-10-CM | POA: Diagnosis present

## 2024-04-22 DIAGNOSIS — M059 Rheumatoid arthritis with rheumatoid factor, unspecified: Secondary | ICD-10-CM

## 2024-04-22 MED ORDER — TRIAMCINOLONE ACETONIDE 40 MG/ML IJ SUSP
40.0000 mg | INTRAMUSCULAR | Status: AC | PRN
  Administered 2024-04-22: 40 mg

## 2024-04-22 MED ORDER — LIDOCAINE HCL 1 % IJ SOLN
1.0000 mL | INTRAMUSCULAR | Status: AC | PRN
  Administered 2024-04-22: 1 mL

## 2024-04-22 MED ORDER — ORENCIA CLICKJECT 125 MG/ML ~~LOC~~ SOAJ
125.0000 mg | SUBCUTANEOUS | 2 refills | Status: DC
  Filled 2024-04-22: qty 4, 28d supply, fill #0
  Filled 2024-05-16: qty 4, 28d supply, fill #1
  Filled 2024-06-08: qty 4, 28d supply, fill #2

## 2024-04-22 NOTE — Progress Notes (Signed)
 Specialty Pharmacy Refill Coordination Note  Elizabeth Bautista is a 48 y.o. female contacted today regarding refills of specialty medication(s) Abatacept  (Orencia  [employer])   Patient requested Delivery   Delivery date: 04/25/24   Verified address: [address] [place]   Medication will be filled on: 04/25/24

## 2024-04-22 NOTE — Patient Instructions (Signed)
 Elizabeth Bautista

## 2024-04-25 ENCOUNTER — Other Ambulatory Visit: Payer: Self-pay

## 2024-04-26 ENCOUNTER — Encounter: Admitting: Physical Medicine and Rehabilitation

## 2024-05-03 ENCOUNTER — Ambulatory Visit: Admitting: Dermatology

## 2024-05-05 ENCOUNTER — Other Ambulatory Visit: Payer: Self-pay

## 2024-05-16 ENCOUNTER — Other Ambulatory Visit (HOSPITAL_COMMUNITY): Payer: Self-pay

## 2024-05-18 ENCOUNTER — Other Ambulatory Visit: Payer: Self-pay

## 2024-05-18 NOTE — Progress Notes (Signed)
 Clinical Intervention Note  Clinical Intervention Notes: Patient reports 2 devices failed on Sunday. First device medication leaked out of pen before she could try to inject, second device medication would not inject despite proper injection technique. Patient does not want to attempt her remaining 2 devices due to this issue and previous failed devices. We are able to ship her a new supply today to arrive tomorrow and she will inject upon receipt and switch her injection day to Thursdays. She will pursue replacement from manufacturer. Additionally discussed proper switch to syringes in future if she continues to have trouble with pens. She will let us  or provider know if she wishes to switch at a later time. Advised Devki as well.   Clinical Intervention Outcomes: Improved therapy adherence   Elizabeth Bautista Specialty Pharmacist

## 2024-05-18 NOTE — Progress Notes (Signed)
 Opened in error

## 2024-05-18 NOTE — Progress Notes (Signed)
 Specialty Pharmacy Refill Coordination Note  Elizabeth Bautista is a 48 y.o. female contacted today regarding refills of specialty medication(s) Abatacept  (Orencia  ClickJect)   Patient requested Delivery   Delivery date: 05/19/24   Verified address: [address] [name] [address]   Medication will be filled on: 05/18/24  Shipping early due to failed devices. See refill/intervention notes.

## 2024-05-30 NOTE — Progress Notes (Deleted)
 "  Office Visit Note  Patient: Elizabeth Bautista             Date of Birth: Oct 13, 1975           MRN: [id number]             PCP: Vonzell Ronnald ONEIDA DEVONNA Referring: Vonzell Ronnald ONEIDA DEVONNA Visit Date: 06/08/2024   Subjective:  No chief complaint on file.   History of Present Illness: Elizabeth Bautista is a 48 y.o. female here for follow up for seropositive rheumatoid arthritis on Orencia  125 mg [place] weekly.   Previous HPI 04/22/2024 Elizabeth Bautista is a 48 y.o. female here for follow up for seropositive rheumatoid arthritis on Orencia  125 mg [place] weekly. She discontinued methotrexate  injections since about 1 month ago because she was having mild side effects and also felt there was limited improvement in symptoms even on the higher dose.  Since stopping methotrexate  she has not noticed any worsening problem. Currently her most severe symptoms are pain and swelling affecting her right hand and wrist.  This is associated with numbness in the hand and gets pain radiation from the wrist into the hand as well as upper arm sometimes all the way to the shoulder.  She did not recall any particular injury or change in activity associated with this increase in symptoms.  She has been using a immobilizing wrist brace often during the day and overnight which is partially helpful. Also has some increase in knee pain with a small amount of swelling.  Both sides affected right is a bit worse than left knee pain but not swelling. She just had recent lab work checked out with her primary care office who was checking things due to reported increase in fatigue as well as body aches.  She had a TSH in normal range and complete blood count metabolic panel were appropriate.  Vitamin D  was severely low at 6.9 and she was just prescribed a high dose vitamin D  supplement that she has not started yet.   Previous HPI 01/19/2024 Elizabeth Bautista is a 48 y.o. female meeting today by telephone encounter here for  follow up for seropositive rheumatoid arthritis on Orencia  125 mg [place] weekly, methotrexate  0.4 mL [place] weekly, and folic acid  1 mg daily.    She reports ongoing symptoms particularly with swelling at the feet and ankles but has not experienced major flareups of her inflammatory arthritis since our last visit.  Does have morning stiffness but hard to differentiate as she has generalized pain throughout the day with combination of fibromyalgia syndrome also with lumbar degenerative disc disease and myofascial pain worse with insomnia.   She is seeing Dr. Lorilee with pain management working on medications including baclofen , Tylenol  No. 4, Savella , possibly starting low-dose naltrexone or Belbuca .  Also has ongoing problems with iron deficiency anemia related to blood loss.  Most recent hemoglobin low at 8.5 she had iron infusion with plan for repeat tests after 1 month.  Denies any serious interval infections.   Previous HPI 09/15/2023 Elizabeth Bautista is a 48 y.o. female here for follow up seropositive rheumatoid arthritis on Orencia  125 mg [place] weekly, methotrexate  0.4 mL  weekly, and folic acid  1 mg daily.    She has experienced some improvement with methotrexate , although not complete. Her hands remain the most affected area, with persistent swelling, particularly in the right hand knuckles. The swelling has decreased but is still present, impacting her daily activities. Her ankles also  swell but are less painful than before. The pain in her ankles has subsided significantly compared to her hands.   She denies any side effects from methotrexate  such as nausea or diarrhea, but has experienced weight gain, which she attributes to another medication for depression. She is currently on a low dose of methotrexate , 0.4 mg, due to previous concerns about liver enzyme levels, which have since normalized following weight loss.   She reports ongoing knee pain, though less intense than previously. She uses a  knee brace, which requires adjustment due to size changes, and notes that she does not feel the need for it as much anymore. She no longer uses a cane when not in pain.   No recent respiratory infections or significant illnesses. She reports cold hands and feet without color changes and experiences morning stiffness in her joints.         Previous HPI 04/29/2023 Elizabeth Bautista is a 48 y.o. female with seropositive rheumatoid arthritis on Orencia  125 mg [place] weekly here due to increased joint pain, particularly in the ankles, over the past month. The pain is described as so severe that it impedes the ability to walk, with some days being completely incapacitating. The patient also reports several falls due to the pain. The pain radiates from the ankles to the underside of the foot and is accompanied by swelling. The patient notes that the right ankle has sometimes been more inflamed than the other, although both are painful.   Pain management has recently switched the patient to oxycodone , which provides temporary relief but does not alleviate the persistent daily pain. Physical therapy has been attempted but was deemed ineffective due to the patient's inability to walk or move the ankles. The patient expressed a preference for aqua therapy, which previously provided more relief, but has been unable to continue due to ongoing diarrhea issues since bariatric surgery   In addition to the ankle pain, the patient reports increased swelling and pain in the right hand, to the point of being unable to close it. The patient continues to take Orencia  shots for rheumatoid arthritis management, which were effective prior to the recent increase in pain.   The patient also reports ongoing gastric issues, including persistent diarrhea and vomiting, since a recent surgery. These issues have been associated with malabsorption, leading to nutritional deficiencies and hair loss. The patient was hospitalized last month  due to these issues, and lab results indicated low levels of certain nutrients.    Previous HPI 11/07/22 Elizabeth Bautista is a 48 y.o. female here for follow up for seropositive RA currently off treatment she discontinued Enbrel  treatment after she had new findings of dysplastic nevus with surgical excision on the bottom of the right foot and plan for follow-up excision of 1 on the left foot.  She stopped taking the medicine for concern of this contributing to skin cancer risk.  Previously had no known personal or family history for cancer.  Since discontinuing the Enbrel  she has increased pain and stiffness in multiple areas.  Starting to see additional swelling again in the right knee and right hand most frequently.   Previous HPI: 10/11/20 Elizabeth Bautista is a 48 y.o. female here for evaluation and management of rheumatoid arthritis. Symptoms started at least since 2017 with hand, wrist, and knee pain and swelling periodically and worsened over time. She did not seek medication evaluation for some time due to being very busy with work and symptoms progressively increased. She was  hospitalized for severe pain and swelling especially in the left wrist and knee and workup at that time was more extensive and revealed positive RA serology suspected as the cause. She saw rheumatology since 2020 and started treatment initial with prednisone  that did not control symptoms well but caused a lot of weight gain. She has had chronic steroid exposure due to refractory asthma symptoms over the years. She also started hydroxychloroquine  that apparently helped symptoms reasonably well initially but has worsened. She moved form [place]  a year ago due to safety circumstances and has some family in the area and is continuing HCQ treatment but overall having a lot of joint pain. Currently feels swelling and heat in the joints multiple days per week and walks with either a cane or walker for her left knee pain and instability  depending on how badly it is acting up. She was recently hospitalized for asthma exacerbation on 4/28 and discharged 5/2.   Labs reviewed 10/2018 RF ~30, CCP >250 ANA neg   Imaging reviewed Chest xray 08/2020 Cardiomegaly no airspace disease   Lumbar spine xray 08/2020 No acute findings     No Rheumatology ROS completed.   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   Mixed stress and urge urinary incontinence 08/14/2022   Encounter for screening fecal occult blood testing 08/14/2022   Encounter for well woman exam with routine gynecological exam 08/14/2022   Decreased libido 08/14/2022   Perimenopause 08/14/2022   Screening examination for STD (sexually transmitted disease) 08/14/2022   Otalgia of both ears 07/09/2022   Vitamin B1 deficiency 04/22/2022   Gastric ulcer 02/11/2022   Laryngopharyngeal reflux (LPR) 12/26/2021   History of gastric bypass 10/21/2021   Spinal stenosis of lumbar region with neurogenic claudication 08/27/2021   Fall 08/27/2021   Sinusitis 08/11/2021   Allergic rhinitis 08/07/2021   Hospital discharge follow-up 08/02/2021   (HFpEF) heart failure with preserved ejection fraction (HCC) 07/20/2021   Body mass index (BMI) 45.0-49.9, adult (HCC) 02/05/2021   Iron deficiency anemia due to chronic blood loss 10/17/2020   Tension-type headache, not intractable 10/17/2020   High risk medication  use 10/11/2020   Acute hip pain, left 09/19/2020   Sciatica 09/11/2020   Dysmenorrhea 06/04/2020   Menorrhagia with irregular cycle 06/04/2020   Anxiety and depression 06/04/2020   Physical deconditioning 05/24/2020   Severe persistent asthma (HCC) 03/08/2020   GERD (gastroesophageal reflux disease) 03/07/2020   Anxiety 03/07/2020   History of seizures 03/07/2020   Swelling of lower extremity 02/01/2020   Essential hypertension 02/01/2020   Vitamin D  deficiency 02/01/2020   Type 2 diabetes mellitus with hyperglycemia, without long-term current use of insulin  (HCC) 02/01/2020   Anemia 02/01/2020   Class 3 severe obesity with serious comorbidity and body mass index (BMI) of 45.0 to 49.9 in adult (HCC) 10/22/2019   OSA on CPAP 10/18/2019   Seropositive rheumatoid arthritis (HCC) 10/18/2019    Past Medical History:  Diagnosis Date  Acute respiratory failure with hypoxia (HCC)    Anemia    Asthma    COVID-19 virus infection 05/16/2020   04/27/20 dx covid-19, not hospitalized   Diabetes (HCC)    on Metformin    Dysplastic nevus 09/24/2022   R ant sole - severe, Excised 10/14/22   Eczema    Edema    Heart problem    something with the arteries on the left side of the heart; upcoming appt with cardiology for evaluation   HTN (hypertension)    Rheumatoid arthritis (HCC)    on Plaquenil     Seizures (HCC)    last seizure 2019   Sleep apnea    Sleep apnea     Family History  Problem Relation Age of Onset   High blood pressure Mother    Rheum arthritis Mother    Diabetes Father    Diabetes Sister    Heart Problems Sister    Diabetes Brother    Diabetes Paternal Grandmother    Diabetes Other        father's side everybody died from Diabetes   High blood pressure Other        mother's side, multiple siblings with this    Heart attack Other        family member on mother's side    Diabetes Paternal Aunt    Seizures Cousin        not sibings to the other cousins with  seizures   Breast cancer Cousin    Seizures Cousin        not sibings to the other cousins with seizures   Seizures Cousin        not sibings to the other cousins with seizures   Cervical cancer Maternal Aunt    Breast cancer Cousin    Dementia Maternal Aunt    Asthma Maternal Aunt    Heart Problems Maternal Grandmother    Diabetes Brother    Asthma Daughter    Angioedema Daughter    Asthma Son    Past Surgical History:  Procedure Laterality Date   APPENDECTOMY     CESAREAN SECTION     x3   CHOLECYSTECTOMY  02/17/2022   ESOPHAGEAL DILATION  03/07/2024   FOOT SURGERY  2024   GASTRIC BYPASS     LAPAROSCOPIC GASTROTOMY W/ REPAIR OF ULCER  02/17/2022   TUBAL LIGATION  2003   Social History   Social History Narrative   Divorced.Lives with 3 kids.Originally from [place].Came from [place] ,[place] 7 months ago.      02/27/2020   Right handed   Caffeine: none     There is no immunization history on file for this patient.   Objective: Vital Signs: There were no vitals taken for this visit.   Physical Exam   Musculoskeletal Exam: ***  CDAI Exam: CDAI Score: -- Patient Global: --; Provider Global: -- Swollen: --; Tender: -- Joint Exam 06/08/2024   No joint exam has been documented for this visit   There is currently no information documented on the homunculus. Go to the Rheumatology activity and complete the homunculus joint exam.  Investigation: No additional findings.  Imaging: No results found.  Recent Labs: Lab Results  Component Value Date   WBC 4.3 09/15/2023   HGB 10.5 (L) 09/15/2023   PLT 204 09/15/2023   NA 140 09/15/2023   K 4.5 09/15/2023   CL 110 09/15/2023   CO2 27 09/15/2023   GLUCOSE 68 09/15/2023   BUN 8 09/15/2023  CREATININE 0.47 (L) 09/15/2023   BILITOT 0.3 09/15/2023   ALKPHOS 85 06/09/2022   AST 27 09/15/2023   ALT 24 09/15/2023   PROT 5.7 (L) 09/15/2023   ALBUMIN 3.2 (L) 06/09/2022   CALCIUM 8.0 (L) 09/15/2023   GFRAA 121  10/11/2020   QFTBGOLDPLUS NEGATIVE 04/29/2023    Speciality Comments: PLQ EYE EXAM 09/12/2021 [place] f/u 2-3 months Enbrel  started 01/29/11  Methotrexate  Approved Quantity: 4 units per 28 days.  Procedures:  No procedures performed Allergies: Ferumoxytol, Phenytoin, Pecan nut (diagnostic), and Phenylbutazones   Assessment / Plan:     Visit Diagnoses: No diagnosis found.  ***  Orders: No orders of the defined types were placed in this encounter.  No orders of the defined types were placed in this encounter.    Follow-Up Instructions: No follow-ups on file.   Gennesis Hogland M Isaias Dowson, CMA  Note - This record has been created using Animal nutritionist.  Chart creation errors have been sought, but may not always  have been located. Such creation errors do not reflect on  the standard of medical care. "

## 2024-06-08 ENCOUNTER — Other Ambulatory Visit: Payer: Self-pay

## 2024-06-08 ENCOUNTER — Ambulatory Visit: Admitting: Internal Medicine

## 2024-06-08 DIAGNOSIS — Z79899 Other long term (current) drug therapy: Secondary | ICD-10-CM

## 2024-06-08 DIAGNOSIS — G5601 Carpal tunnel syndrome, right upper limb: Secondary | ICD-10-CM

## 2024-06-08 DIAGNOSIS — M059 Rheumatoid arthritis with rheumatoid factor, unspecified: Secondary | ICD-10-CM

## 2024-06-10 ENCOUNTER — Other Ambulatory Visit (HOSPITAL_COMMUNITY): Payer: Self-pay

## 2024-06-10 NOTE — Progress Notes (Signed)
 Specialty Pharmacy Refill Coordination Note  Elizabeth Bautista is a 49 y.o. female contacted today regarding refills of specialty medication(s) Abatacept  (Orencia  [employer])   Patient requested Delivery   Delivery date: 06/16/24   Verified address: [name] [place] [address]   Medication will be filled on: 06/15/24

## 2024-06-15 ENCOUNTER — Other Ambulatory Visit: Payer: Self-pay

## 2024-06-20 NOTE — Progress Notes (Unsigned)
 "  Virtual Visit via Video Note:   Consent was obtained for video visit:  Yes.   Answered questions that patient had about telehealth interaction:  Yes.   I discussed the limitations, risks, security and privacy concerns of performing an evaluation and management service by telemedicine. I also discussed with the patient that there may be a patient responsible charge related to this service. The patient expressed understanding and agreed to proceed.  Pt location: Home Physician Location: office Name of referring provider:  Vonzell Ronnald DASEN, PA-C I connected with Elizabeth Bautista at patients initiation/request on 06/21/2024 at  2:10 PM EST by video enabled telemedicine application and verified that I am speaking with the correct person using two identifiers. Pt MRN:  [id number] Pt DOB:  05-17-1976 Video Participants:  Elizabeth Bautista  Assessment/Plan:    Migraine without aura, without status migrainosus, not intractable   Migraine prevention:  Plan to start Emgality  Migraine rescue:  Will try to get Ubrelvy  100mg  approved.  She has failed multiple medications including Nurtec, triptans (sumatriptan , rizatriptan ) and Ubrelvy  is the only medication that is effective. Limit use of pain relievers to no more than 9 days out of the month to prevent risk of rebound or medication-overuse headache. Keep headache diary Follow up 6 months       Subjective:  Elizabeth Bautista is a 49 year old right-handed female with asthma. HTN, DM II, RA, iron-deficiency anemia, sleep apnea and pseudoseizures who follows up for migraines.  UPDATE: Discontinued Aimovig  2 months ago as it was ineffective. Ubrelvy  wasn't formulary, so she was provided samples of Nurtec, which were ineffective. Intensity:  severe Duration:  several hours Frequency:  14 days a month   Current NSAIDS/analgesics:  oxycodone  (chronic pain), Belbuca  (chronic pain), Mobic  (chronic pain) Current triptans:  none Current ergotamine:   none Current anti-emetic:  Reglan 10mg , Zofran  Current muscle relaxants:  baclofen  rotating with Flexeril  Current Antihypertensive medications:  metoprolol  tartrate, amlodipine , HCTZ Current Antidepressant medications:  amitriptyline  200mg , duloxetine  60mg  daily, mirtazapine  30mg   Current Anticonvulsant medications:  topiramate  50mg  QHS (for chronic pain by her pain doctor) Current anti-CGRP:  none Current Antihistamines/Decongestants:  Zyrtec , meclizine, scopolamine  Other therapy:  none Hormone/birth control:  none Other medications:  buspirone , Vraylar, methotrexate , Savella   Caffeine:  no Alcohol:  occasional Smoker:  no Diet:  Drinks a lot of water.  Planning to have bariatric surgery Exercise:  she does but limited due to chronic pain (RA and fibromyalgia) Depression:  yes; Anxiety:  yes.  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 CT head on 03/10/2020 personally reviewed normal.  EEG on 03/12/2020 normal. Other pain:  Chronic pain syndrome - has RA but also underlying fibromyalgia suspected.  Followed by physical medicine and rehab Sleep hygiene:  Poor sleep.  May be related to stress.  She has OSA.  Needs a new CPAP.     HISTORY: History of migraines since childhood but resolved in young adulthood.  They returned around 2021 but progressively has gotten worse.  Severe diffuse squeezing and stabbing headache including the neck.  Back of neck gets stiff.  Nausea, vomiting, blurred vision/or vision goes dark; She saw an ophthalmologist and had an unremarkable eye exam.  Sometimes bilateral hand tingling, photophobia and phonophobia and dizziness.  Usually last all day.  They occur usually 4 days a week.  No known triggers.  Nothing helps them.  She also reports short term memory problems in 2021 as well.  She says she  will sometimes leave the stove on or may forget conversations.  Prior thyroid tests okay.  Clemens a couple of weeks ago and hit the  back of her head.  Has a lump on back of head.  MRI of brain with and without contrast on 09/24/2021 was normal.      Past NSAIDS/analgesics:  tramadol , ibuprofen , naproxen  Past abortive triptans:  sumatriptan  tab, rizatriptan  Past abortive ergotamine:  none Past muscle relaxants:  Flexeril  Past anti-emetic: Promethazine  Past antihypertensive medications:  amlodipine , Lasix , HCTZ.  Beta blockers contraindicated due to severe asthma.   Past antidepressant medications:  escitalopram  Past anticonvulsant medications:  Keppra , gabapentin , pregablin, zonisamide  Past anti-CGRP:  Ubrelvy  100mg , Nurtec PRN, Aimovig  140mg  Past antihistamines/decongestants:  Flonase  Other past therapies:  none    Family history of headache:  Mother (migraines)  Past Medical History: Past Medical History:  Diagnosis Date   Acute respiratory failure with hypoxia (HCC)    Anemia    Asthma    COVID-19 virus infection 05/16/2020   04/27/20 dx covid-19, not hospitalized   Diabetes (HCC)    on Metformin    Dysplastic nevus 09/24/2022   R ant sole - severe, Excised 10/14/22   Eczema    Edema    Heart problem    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    albuterol  (PROAIR  HFA) 108 (90 Base) MCG/ACT inhaler Inhale 2 puffs into the lungs every 6 (six) hours as needed for wheezing or shortness of breath. 2 puffs every 4 hours as needed only  if your can't catch your breath    albuterol  (PROVENTIL ) (2.5 MG/3ML) 0.083% nebulizer solution Take 3 mLs (2.5 mg total) by nebulization every 6 (six) hours as needed for wheezing or shortness of  breath.    amitriptyline  (ELAVIL ) 150 MG tablet Take 150 mg by mouth at bedtime.    amitriptyline  (ELAVIL ) 50 MG tablet Take 1 tablet (50 mg total) by mouth at bedtime.    amLODipine  (NORVASC ) 5 MG tablet Take by mouth.    Ascorbic Acid (VITAMIN C) 1000 MG tablet Take 1,000 mg by mouth daily. (Patient not taking: Reported on 04/22/2024)    baclofen  (LIORESAL ) 10 MG tablet Take 1 tablet (10 mg total) by mouth 3 (three) times daily.    blood glucose meter kit and supplies Dispense based on patient and insurance preference. Use up to four times daily as directed. (FOR ICD-10 E10.9, E11.9).    Buprenorphine  HCl (BELBUCA ) 150 MCG FILM Place 1 Film inside cheek 2 (two) times daily.    busPIRone  (BUSPAR ) 7.5 MG tablet Take 7.5 mg by mouth daily. 06/09/2022: No pharmacy records to confirm. Pt states she is taking and getting from her regular pharmacy.   cariprazine (VRAYLAR) 1.5 MG capsule Take by mouth.    cetirizine  (ZYRTEC ) 10 MG tablet TAKE 1 TABLET BY MOUTH EVERY DAY    cholestyramine (QUESTRAN) 4 g packet Take 4 g by mouth.    colestipol (COLESTID) 1 g tablet Take 2 g by mouth daily.    cyclobenzaprine  (FLEXERIL ) 5 MG tablet Take  1 tablet (5 mg total) by mouth 3 (three) times daily as needed for muscle spasms.    doxycycline (MONODOX) 100 MG capsule Take 100 mg by mouth 2 (two) times daily.    DULoxetine  (CYMBALTA ) 60 MG capsule Take by mouth.    EPINEPHrine  0.3 mg/0.3 mL IJ SOAJ injection Inject 0.3 mg into the muscle as needed for anaphylaxis. (Patient not taking: Reported on 04/22/2024)    Erenumab -aooe (AIMOVIG ) 140 MG/ML SOAJ Inject 140 mg into the skin every 30 (thirty) days.    eszopiclone  (LUNESTA ) 1 MG TABS tablet Take 1 tablet (1 mg total) by mouth at bedtime as needed for sleep. Take immediately before bedtime    famotidine  (PEPCID ) 20 MG tablet Take 1 tablet (20 mg total) by mouth daily.    Fluticasone -Umeclidin-Vilant (TRELEGY ELLIPTA ) 200-62.5-25 MCG/ACT AEPB Inhale 1 puff into the  lungs daily.    furosemide  (LASIX ) 20 MG tablet Take 1 tablet (20 mg total) by mouth daily as needed.    hydrochlorothiazide  (HYDRODIURIL ) 25 MG tablet Take by mouth.    hydrOXYzine  (ATARAX ) 25 MG tablet Take 25 mg by mouth 3 (three) times daily as needed.    ibuprofen  (ADVIL ) 800 MG tablet TAKE 1 TABLET BY MOUTH EVERY 8 HOURS AS NEEDED    ipratropium-albuterol  (DUONEB) 0.5-2.5 (3) MG/3ML SOLN Take 3 mLs by nebulization every 6 (six) hours as needed.    lidocaine  (LIDODERM ) 5 % Place 2 patches onto the skin daily. Remove & Discard patch within 12 hours or as directed by MD    meclizine (ANTIVERT) 12.5 MG tablet TAKE ONE TABLET (12.5 MG DOSE) BY MOUTH 3 (THREE) TIMES A DAY AS NEEDED FOR NAUSEA.    methocarbamol  (ROBAXIN ) 500 MG tablet Take 1 tablet (500 mg total) by mouth 4 (four) times daily.    metoCLOPramide (REGLAN) 10 MG tablet Take by mouth.    metoprolol  tartrate (LOPRESSOR ) 50 MG tablet Take by mouth.    mirtazapine  (REMERON ) 30 MG tablet Take by mouth.    montelukast  (SINGULAIR ) 10 MG tablet TAKE 1 TABLET BY MOUTH EVERYDAY AT BEDTIME    Multiple Vitamin (MULTIVITAMIN) tablet Take 1 tablet by mouth daily.    mupirocin  ointment (BACTROBAN ) 2 % Apply 1 Application topically daily. Qd to excision site    omeprazole  (PRILOSEC) 40 MG capsule Take by mouth.    ondansetron  (ZOFRAN -ODT) 8 MG disintegrating tablet Take by mouth.    oxyCODONE  (ROXICODONE ) 5 MG/5ML solution Take 5 mg by mouth every 8 (eight) hours as needed.    pantoprazole  (PROTONIX ) 40 MG tablet TAKE 1 TABLET (40 MG TOTAL) BY MOUTH DAILY. TAKE 30-60 MIN BEFORE FIRST MEAL OF THE DAY    potassium chloride  SA (KLOR-CON  M) 20 MEQ tablet Take 1 tablet (20 mEq total) by mouth 2 (two) times daily.    prazosin  (MINIPRESS ) 1 MG capsule Take by mouth.    QUEtiapine  (SEROQUEL ) 100 MG tablet     rizatriptan  (MAXALT -MLT) 10 MG disintegrating tablet     scopolamine  (TRANSDERM-SCOP) 1 MG/3DAYS PLACE 1 PATCH ONTO THE SKIN EVERY 3 DAYS.     Thiamine HCl (VITAMIN B-1) 250 MG tablet Take by mouth.    topiramate  (TOPAMAX ) 25 MG tablet Take 1 tablet (25 mg total) by mouth at bedtime.    tretinoin  (RETIN-A ) 0.025 % cream Apply pea sized amount to face qhs for acne (Patient not taking: Reported on 04/22/2024)    triamcinolone  (KENALOG ) 0.025 % ointment Apply 1 Application topically 2 (two) times daily.    Ubrogepant  (UBRELVY )  100 MG TABS Take 1 tablet (100 mg total) by mouth as needed. May repeat after 2 hours.  Maximum 2 tablets in 24 hours.    Vitamin D , Ergocalciferol , (DRISDOL ) 1.25 MG (50000 UNIT) CAPS capsule Take 1 capsule (50,000 Units total) by mouth every 7 (seven) days.    zonisamide  (ZONEGRAN ) 100 MG capsule Take by mouth.    No facility-administered encounter medications on file as of 06/21/2024.    Allergies: Allergies[1]  Family History: Family History  Problem Relation Age of Onset   High blood pressure Mother    Rheum arthritis Mother    Diabetes Father    Diabetes Sister    Heart Problems Sister    Diabetes Brother    Diabetes Paternal Grandmother    Diabetes Other        father's side everybody died from Diabetes   High blood pressure Other        mother's side, multiple siblings with this    Heart attack Other        family member on mother's side    Diabetes Paternal Aunt    Seizures Cousin        not sibings to the other cousins with seizures   Breast cancer Cousin    Seizures Cousin        not sibings to the other cousins with seizures   Seizures Cousin        not sibings to the other cousins with seizures   Cervical cancer Maternal Aunt    Breast cancer Cousin    Dementia Maternal Aunt    Asthma Maternal Aunt    Heart Problems Maternal Grandmother    Diabetes Brother    Asthma Daughter    Angioedema Daughter    Asthma Son     Observations/Objective:   No acute distress.  Alert and oriented.  Speech fluent and not dysarthric.  Language intact.  Eyes orthophoric on primary gaze.  Face  symmetric.   Follow up Instructions:      -I discussed the assessment and treatment plan with the patient. The patient was provided an opportunity to ask questions and all were answered. The patient agreed with the plan and demonstrated an understanding of the instructions.   The patient was advised to call back or seek an in-person evaluation if the symptoms worsen or if the condition fails to improve as anticipated.   Juliene Lamar Dunnings, DO    CC: Ronnald Gelineau, PA-C         [1]  Allergies Allergen Reactions   Ferumoxytol Anaphylaxis    ~5 years ago. Tolerated IV venofer   Phenytoin Anaphylaxis    Other reaction(s): swelling and heart rate decreased   Pecan Nut (Diagnostic) Other (See Comments)   Phenylbutazones Other (See Comments)   "

## 2024-06-21 ENCOUNTER — Other Ambulatory Visit (HOSPITAL_COMMUNITY): Payer: Self-pay

## 2024-06-21 ENCOUNTER — Telehealth: Payer: Self-pay | Admitting: Pharmacy Technician

## 2024-06-21 ENCOUNTER — Telehealth: Admitting: Neurology

## 2024-06-21 ENCOUNTER — Telehealth: Payer: Self-pay

## 2024-06-21 ENCOUNTER — Encounter: Payer: Self-pay | Admitting: Neurology

## 2024-06-21 DIAGNOSIS — G43009 Migraine without aura, not intractable, without status migrainosus: Secondary | ICD-10-CM | POA: Diagnosis not present

## 2024-06-21 MED ORDER — EMGALITY 120 MG/ML ~~LOC~~ SOAJ: 240.0000 mg | Freq: Once | SUBCUTANEOUS | 0 refills | Status: AC

## 2024-06-21 MED ORDER — UBRELVY 100 MG PO TABS: 1.0000 | ORAL_TABLET | ORAL | 11 refills | Status: AC | PRN

## 2024-06-21 NOTE — Telephone Encounter (Signed)
 PA has been submitted, and telephone encounter has been created. Please see telephone encounter dated 1.20.26.

## 2024-06-21 NOTE — Telephone Encounter (Signed)
 Patient to start Emgality  120 mg and restart Ubrelvy  100 mg.

## 2024-06-21 NOTE — Telephone Encounter (Signed)
 Pharmacy Patient Advocate Encounter   Received notification from Pt Calls Messages that prior authorization for EMGALITY  120MG  is required/requested.   Insurance verification completed.   The patient is insured through [facility] MEDICAID.   Per test claim: PA required; PA submitted to above mentioned insurance via Latent Key/confirmation #/EOC [id number] Status is pending

## 2024-06-21 NOTE — Telephone Encounter (Signed)
 Pharmacy Patient Advocate Encounter   Received notification from Pt Calls Messages that prior authorization for UBRELVY  100MG  is required/requested.   Insurance verification completed.   The patient is insured through [facility] MEDICAID.   Per test claim: PA required; PA submitted to above mentioned insurance via Latent Key/confirmation #/EOC [id number] Status is pending

## 2024-06-23 ENCOUNTER — Encounter: Admitting: Physical Medicine and Rehabilitation

## 2024-06-30 ENCOUNTER — Other Ambulatory Visit: Payer: Self-pay

## 2024-07-04 ENCOUNTER — Encounter: Admitting: Physical Medicine and Rehabilitation

## 2024-07-04 ENCOUNTER — Other Ambulatory Visit (HOSPITAL_COMMUNITY): Payer: Self-pay

## 2024-07-04 NOTE — Telephone Encounter (Signed)
 Pharmacy Patient Advocate Encounter  Received notification from [facility] that Prior Authorization for EMGALITY  120MG  has been APPROVED from 1.21.26 to 12.31.2099. Ran test claim, Copay is $12.65. This test claim was processed through [facility]- copay amounts may vary at other pharmacies due to pharmacy/plan contracts, or as the patient moves through the different stages of their insurance plan.   PA #/Case ID/Reference # [id number]

## 2024-07-04 NOTE — Telephone Encounter (Signed)
 Pharmacy Patient Advocate Encounter  Received notification from [employer] that Prior Authorization for UBRELVY  100MG  has been CANCELLED due to

## 2024-07-06 ENCOUNTER — Other Ambulatory Visit: Payer: Self-pay | Admitting: Internal Medicine

## 2024-07-06 ENCOUNTER — Other Ambulatory Visit: Payer: Self-pay

## 2024-07-06 DIAGNOSIS — Z79899 Other long term (current) drug therapy: Secondary | ICD-10-CM

## 2024-07-06 DIAGNOSIS — M059 Rheumatoid arthritis with rheumatoid factor, unspecified: Secondary | ICD-10-CM

## 2024-07-06 NOTE — Telephone Encounter (Signed)
 Last Fill: 04/22/2024  Labs: 04/15/2024 CBC- RBC:3.74, Hematocrit:33.5 CMP-Creatinine:0.48, Calcium:8.4, Protein:5.7, Albumin:3.8, AlkalinePhosphatase:268, ALT(SGOT):39  TB Gold: 04/29/2023 Negative   Next Visit: 07/27/2024  Last Visit: 04/22/2024  IK:Dzmnendpupcz rheumatoid arthritis   Current Dose per office note 04/22/2024: Orencia  125 mg subcu weekly   Okay to refill Orencia ?

## 2024-07-07 ENCOUNTER — Other Ambulatory Visit: Payer: Self-pay

## 2024-07-08 ENCOUNTER — Other Ambulatory Visit (HOSPITAL_COMMUNITY): Payer: Self-pay

## 2024-07-08 ENCOUNTER — Other Ambulatory Visit: Payer: Self-pay

## 2024-07-08 MED ORDER — ORENCIA CLICKJECT 125 MG/ML ~~LOC~~ SOAJ
125.0000 mg | SUBCUTANEOUS | 0 refills | Status: AC
  Filled 2024-07-08 (×2): qty 4, 28d supply, fill #0

## 2024-07-12 ENCOUNTER — Ambulatory Visit: Admitting: Dermatology

## 2024-07-26 ENCOUNTER — Ambulatory Visit: Admitting: Neurology

## 2024-07-27 ENCOUNTER — Ambulatory Visit: Admitting: Internal Medicine

## 2024-07-28 ENCOUNTER — Encounter: Admitting: Physical Medicine and Rehabilitation
